# Patient Record
Sex: Female | Born: 1955 | Race: White | Hispanic: No | Smoking: Current every day smoker
Health system: Southern US, Community
[De-identification: ages and names within clinical notes are randomized; demographics above are authoritative.]

## PROBLEM LIST (undated history)

## (undated) DIAGNOSIS — I6523 Occlusion and stenosis of bilateral carotid arteries: Secondary | ICD-10-CM

## (undated) DIAGNOSIS — F32A Depression, unspecified: Secondary | ICD-10-CM

## (undated) DIAGNOSIS — D696 Thrombocytopenia, unspecified: Secondary | ICD-10-CM

## (undated) DIAGNOSIS — K5792 Diverticulitis of intestine, part unspecified, without perforation or abscess without bleeding: Secondary | ICD-10-CM

## (undated) DIAGNOSIS — Z7901 Long term (current) use of anticoagulants: Secondary | ICD-10-CM

## (undated) DIAGNOSIS — I480 Paroxysmal atrial fibrillation: Secondary | ICD-10-CM

## (undated) DIAGNOSIS — M5412 Radiculopathy, cervical region: Secondary | ICD-10-CM

## (undated) DIAGNOSIS — R42 Dizziness and giddiness: Secondary | ICD-10-CM

## (undated) DIAGNOSIS — K219 Gastro-esophageal reflux disease without esophagitis: Secondary | ICD-10-CM

## (undated) DIAGNOSIS — I10 Essential (primary) hypertension: Secondary | ICD-10-CM

## (undated) DIAGNOSIS — G4730 Sleep apnea, unspecified: Secondary | ICD-10-CM

## (undated) DIAGNOSIS — G5601 Carpal tunnel syndrome, right upper limb: Secondary | ICD-10-CM

## (undated) DIAGNOSIS — I471 Supraventricular tachycardia, unspecified: Secondary | ICD-10-CM

## (undated) DIAGNOSIS — K760 Fatty (change of) liver, not elsewhere classified: Secondary | ICD-10-CM

## (undated) DIAGNOSIS — R519 Headache, unspecified: Secondary | ICD-10-CM

## (undated) DIAGNOSIS — Z9289 Personal history of other medical treatment: Secondary | ICD-10-CM

## (undated) DIAGNOSIS — I209 Angina pectoris, unspecified: Secondary | ICD-10-CM

## (undated) DIAGNOSIS — F1410 Cocaine abuse, uncomplicated: Secondary | ICD-10-CM

## (undated) DIAGNOSIS — M1990 Unspecified osteoarthritis, unspecified site: Secondary | ICD-10-CM

## (undated) DIAGNOSIS — I2510 Atherosclerotic heart disease of native coronary artery without angina pectoris: Secondary | ICD-10-CM

## (undated) DIAGNOSIS — K5790 Diverticulosis of intestine, part unspecified, without perforation or abscess without bleeding: Secondary | ICD-10-CM

## (undated) DIAGNOSIS — R1900 Intra-abdominal and pelvic swelling, mass and lump, unspecified site: Secondary | ICD-10-CM

## (undated) DIAGNOSIS — K921 Melena: Secondary | ICD-10-CM

## (undated) DIAGNOSIS — I872 Venous insufficiency (chronic) (peripheral): Secondary | ICD-10-CM

## (undated) DIAGNOSIS — N3281 Overactive bladder: Secondary | ICD-10-CM

## (undated) DIAGNOSIS — G4733 Obstructive sleep apnea (adult) (pediatric): Secondary | ICD-10-CM

## (undated) DIAGNOSIS — R1310 Dysphagia, unspecified: Secondary | ICD-10-CM

## (undated) DIAGNOSIS — D649 Anemia, unspecified: Secondary | ICD-10-CM

## (undated) DIAGNOSIS — K529 Noninfective gastroenteritis and colitis, unspecified: Secondary | ICD-10-CM

## (undated) DIAGNOSIS — I499 Cardiac arrhythmia, unspecified: Secondary | ICD-10-CM

## (undated) DIAGNOSIS — Z9989 Dependence on other enabling machines and devices: Secondary | ICD-10-CM

## (undated) DIAGNOSIS — J849 Interstitial pulmonary disease, unspecified: Secondary | ICD-10-CM

## (undated) DIAGNOSIS — K589 Irritable bowel syndrome without diarrhea: Secondary | ICD-10-CM

## (undated) DIAGNOSIS — U071 COVID-19: Secondary | ICD-10-CM

## (undated) DIAGNOSIS — R0600 Dyspnea, unspecified: Secondary | ICD-10-CM

## (undated) DIAGNOSIS — F329 Major depressive disorder, single episode, unspecified: Secondary | ICD-10-CM

## (undated) DIAGNOSIS — F419 Anxiety disorder, unspecified: Secondary | ICD-10-CM

## (undated) DIAGNOSIS — J45909 Unspecified asthma, uncomplicated: Secondary | ICD-10-CM

## (undated) DIAGNOSIS — J449 Chronic obstructive pulmonary disease, unspecified: Secondary | ICD-10-CM

## (undated) DIAGNOSIS — E781 Pure hyperglyceridemia: Secondary | ICD-10-CM

## (undated) DIAGNOSIS — Z720 Tobacco use: Secondary | ICD-10-CM

## (undated) DIAGNOSIS — G4700 Insomnia, unspecified: Secondary | ICD-10-CM

## (undated) DIAGNOSIS — R1030 Lower abdominal pain, unspecified: Secondary | ICD-10-CM

## (undated) DIAGNOSIS — E119 Type 2 diabetes mellitus without complications: Secondary | ICD-10-CM

## (undated) DIAGNOSIS — I700 Atherosclerosis of aorta: Secondary | ICD-10-CM

## (undated) DIAGNOSIS — G2581 Restless legs syndrome: Secondary | ICD-10-CM

## (undated) HISTORY — DX: Depression, unspecified: F32.A

## (undated) HISTORY — DX: Sleep apnea, unspecified: G47.30

## (undated) HISTORY — DX: Pure hyperglyceridemia: E78.1

## (undated) HISTORY — DX: Unspecified osteoarthritis, unspecified site: M19.90

## (undated) HISTORY — DX: Atherosclerosis of aorta: I70.0

## (undated) HISTORY — DX: Chronic obstructive pulmonary disease, unspecified: J44.9

## (undated) HISTORY — DX: Irritable bowel syndrome, unspecified: K58.9

## (undated) HISTORY — DX: Major depressive disorder, single episode, unspecified: F32.9

## (undated) HISTORY — PX: SHOULDER SURGERY: SHX246

## (undated) HISTORY — DX: Essential (primary) hypertension: I10

## (undated) HISTORY — DX: Diverticulitis of intestine, part unspecified, without perforation or abscess without bleeding: K57.92

## (undated) HISTORY — PX: KNEE SURGERY: SHX244

## (undated) HISTORY — DX: Long term (current) use of anticoagulants: Z79.01

## (undated) HISTORY — PX: EYE SURGERY: SHX253

## (undated) HISTORY — DX: Atherosclerotic heart disease of native coronary artery without angina pectoris: I25.10

## (undated) HISTORY — PX: JOINT REPLACEMENT: SHX530

## (undated) HISTORY — DX: Type 2 diabetes mellitus without complications: E11.9

## (undated) HISTORY — DX: Personal history of other medical treatment: Z92.89

## (undated) HISTORY — DX: Gastro-esophageal reflux disease without esophagitis: K21.9

## (undated) HISTORY — DX: Unspecified asthma, uncomplicated: J45.909

## (undated) HISTORY — DX: Cocaine abuse, uncomplicated: F14.10

## (undated) HISTORY — DX: Anemia, unspecified: D64.9

## (undated) HISTORY — PX: REPLACEMENT TOTAL KNEE: SUR1224

## (undated) HISTORY — PX: CATARACT EXTRACTION, BILATERAL: SHX1313

## (undated) HISTORY — PX: ABDOMINAL HYSTERECTOMY: SHX81

## (undated) HISTORY — PX: KNEE ARTHROPLASTY: SHX992

## (undated) HISTORY — PX: KNEE ARTHROSCOPY: SHX127

---

## 1983-11-26 HISTORY — PX: CHOLECYSTECTOMY: SHX55

## 1983-11-26 HISTORY — PX: APPENDECTOMY: SHX54

## 2005-11-25 HISTORY — PX: CARDIAC CATHETERIZATION: SHX172

## 2006-01-05 ENCOUNTER — Emergency Department: Payer: Self-pay | Admitting: Internal Medicine

## 2006-03-18 ENCOUNTER — Ambulatory Visit: Payer: Self-pay | Admitting: Internal Medicine

## 2006-07-24 ENCOUNTER — Inpatient Hospital Stay: Payer: Self-pay | Admitting: Internal Medicine

## 2006-07-24 ENCOUNTER — Other Ambulatory Visit: Payer: Self-pay

## 2006-07-24 HISTORY — PX: CORONARY ANGIOPLASTY WITH STENT PLACEMENT: SHX49

## 2006-09-12 ENCOUNTER — Ambulatory Visit: Payer: Self-pay | Admitting: Internal Medicine

## 2006-12-02 ENCOUNTER — Ambulatory Visit: Payer: Self-pay | Admitting: Internal Medicine

## 2006-12-16 ENCOUNTER — Other Ambulatory Visit: Payer: Self-pay

## 2006-12-23 ENCOUNTER — Inpatient Hospital Stay: Payer: Self-pay | Admitting: Unknown Physician Specialty

## 2007-01-28 ENCOUNTER — Encounter: Payer: Self-pay | Admitting: Unknown Physician Specialty

## 2007-03-25 ENCOUNTER — Ambulatory Visit: Payer: Self-pay | Admitting: Internal Medicine

## 2008-05-25 DIAGNOSIS — E119 Type 2 diabetes mellitus without complications: Secondary | ICD-10-CM

## 2008-05-25 HISTORY — DX: Type 2 diabetes mellitus without complications: E11.9

## 2008-08-09 ENCOUNTER — Ambulatory Visit: Payer: Self-pay | Admitting: Internal Medicine

## 2008-08-31 ENCOUNTER — Ambulatory Visit: Payer: Self-pay | Admitting: Unknown Physician Specialty

## 2008-11-09 ENCOUNTER — Ambulatory Visit: Payer: Self-pay

## 2009-02-13 ENCOUNTER — Ambulatory Visit: Payer: Self-pay | Admitting: Gastroenterology

## 2009-11-30 ENCOUNTER — Ambulatory Visit: Payer: Self-pay | Admitting: Internal Medicine

## 2010-05-03 ENCOUNTER — Ambulatory Visit: Payer: Self-pay | Admitting: Internal Medicine

## 2010-12-20 ENCOUNTER — Ambulatory Visit: Payer: Self-pay | Admitting: Internal Medicine

## 2010-12-26 ENCOUNTER — Ambulatory Visit: Payer: Self-pay | Admitting: Internal Medicine

## 2011-04-25 ENCOUNTER — Ambulatory Visit: Payer: Self-pay | Admitting: Internal Medicine

## 2011-05-06 ENCOUNTER — Ambulatory Visit: Payer: Self-pay | Admitting: Internal Medicine

## 2011-05-26 ENCOUNTER — Ambulatory Visit: Payer: Self-pay | Admitting: Internal Medicine

## 2011-06-05 LAB — LIPID PANEL
Cholesterol: 155 mg/dL (ref 0–200)
HDL: 41 mg/dL (ref 35–70)
LDL Cholesterol: 91 mg/dL
Triglycerides: 115 mg/dL (ref 40–160)

## 2011-06-05 LAB — HEPATIC FUNCTION PANEL: Alkaline Phosphatase: 56 U/L (ref 25–125)

## 2011-06-05 LAB — BASIC METABOLIC PANEL: BUN: 8 mg/dL (ref 4–21)

## 2011-06-26 ENCOUNTER — Ambulatory Visit: Payer: Self-pay | Admitting: Internal Medicine

## 2012-05-21 ENCOUNTER — Ambulatory Visit: Payer: Self-pay | Admitting: Internal Medicine

## 2012-08-28 ENCOUNTER — Other Ambulatory Visit: Payer: Self-pay | Admitting: Internal Medicine

## 2012-08-28 MED ORDER — CYCLOBENZAPRINE HCL 10 MG PO TABS: 10.0000 mg | ORAL_TABLET | Freq: Three times a day (TID) | ORAL | Status: DC | PRN

## 2012-08-28 NOTE — Telephone Encounter (Signed)
Ok to refill flexeril 10mg  tid prn #90 with one refill.  thanks

## 2012-08-28 NOTE — Telephone Encounter (Signed)
Pt is needing refill on Felxeril.

## 2012-08-28 NOTE — Telephone Encounter (Signed)
Kathryn Friedman Pt is complete out of meds   cyclobenzapr 10mg    1 only tablet by mouth 3 times day

## 2012-10-27 ENCOUNTER — Telehealth: Payer: Self-pay | Admitting: Internal Medicine

## 2012-10-27 NOTE — Telephone Encounter (Signed)
Caller: Aisa/Patient; Phone: [phone]; Reason for Call: Patient wants to know if Dr.  Lorin Picket would consider placing patient back on Abilify.  Patient is following Dr.  Lorin Picket from [facility] and has appt scheduled 11/24/12.  Has been on celexa, and came off Abilify as she felt better about a year ago, but would like to restart it.  States is safe.  No triage done due to new patient in this office; info to office for provider review/Rx/callback.  Uses [facility] on [address]  May reach patient at [phone].

## 2012-10-28 NOTE — Telephone Encounter (Signed)
Left message for patient to return call.

## 2012-10-28 NOTE — Telephone Encounter (Signed)
I do not prescribe abilify - this is prescribed by psychiatry.  She will need to be referred to psychiatry for evaluation and question of need for abilify.  I can make appt if she desires.

## 2012-10-29 NOTE — Telephone Encounter (Signed)
Called patient to let her know. Patient was not interested in referral.

## 2012-10-29 NOTE — Telephone Encounter (Signed)
Patient returned your call.

## 2012-11-04 ENCOUNTER — Telehealth: Payer: Self-pay | Admitting: Internal Medicine

## 2012-11-04 NOTE — Telephone Encounter (Signed)
Pt is calling back concerning medication and to see if Dr. Lorin Picket received her records ???

## 2012-11-05 ENCOUNTER — Telehealth: Payer: Self-pay | Admitting: *Deleted

## 2012-11-05 ENCOUNTER — Other Ambulatory Visit: Payer: Self-pay | Admitting: *Deleted

## 2012-11-05 MED ORDER — TRAZODONE HCL 100 MG PO TABS: 100.0000 mg | ORAL_TABLET | Freq: Every day | ORAL | Status: DC

## 2012-11-05 NOTE — Telephone Encounter (Signed)
I called pharmacy to change rx.  Pt had already come by to pick up 90 pills.  I cancelled the three refills.  She has an appt to see me soon.

## 2012-11-05 NOTE — Telephone Encounter (Signed)
Patients Trazodone 100mg  was refilled with # 90 and 3 refills. Dr. Lorin Picket cancelled the 3 refills because the patient has not established with her but has an appointment on 11-24-12.

## 2012-11-05 NOTE — Telephone Encounter (Signed)
Filled script for Trazodone 100 mg x's 3 refills

## 2012-11-12 ENCOUNTER — Telehealth: Payer: Self-pay | Admitting: Internal Medicine

## 2012-11-12 NOTE — Telephone Encounter (Signed)
Clopidogrel 75 mg tablet  Take one tablet by mouth every day  #90  Ventolin HFA 71mcg/inh  In 54 GM  Inhale 2 puffs four times a dauy

## 2012-11-16 MED ORDER — ALBUTEROL SULFATE HFA 108 (90 BASE) MCG/ACT IN AERS: 2.0000 | INHALATION_SPRAY | Freq: Four times a day (QID) | RESPIRATORY_TRACT | Status: DC | PRN

## 2012-11-16 MED ORDER — CLOPIDOGREL BISULFATE 75 MG PO TABS: 75.0000 mg | ORAL_TABLET | Freq: Every day | ORAL | Status: DC

## 2012-11-16 NOTE — Telephone Encounter (Signed)
Sent in to pharmacy.  

## 2012-11-24 ENCOUNTER — Ambulatory Visit: Payer: Self-pay | Admitting: Internal Medicine

## 2012-11-24 ENCOUNTER — Telehealth: Payer: Self-pay | Admitting: Internal Medicine

## 2012-11-24 ENCOUNTER — Other Ambulatory Visit: Payer: Self-pay | Admitting: *Deleted

## 2012-11-24 MED ORDER — CYCLOBENZAPRINE HCL 10 MG PO TABS: 10.0000 mg | ORAL_TABLET | Freq: Three times a day (TID) | ORAL | Status: DC | PRN

## 2012-11-24 NOTE — Telephone Encounter (Signed)
Pt is needing refill on Flexeril 30 mg. She uses [facility] on [address]

## 2012-11-24 NOTE — Telephone Encounter (Signed)
complete

## 2012-12-03 ENCOUNTER — Other Ambulatory Visit: Payer: Self-pay | Admitting: Internal Medicine

## 2012-12-03 NOTE — Telephone Encounter (Signed)
clopidogrel (PLAVIX) 75 MG tablet  # 90  Send to [facility]   DO NOT SEND TO  [facility]

## 2012-12-04 MED ORDER — CLOPIDOGREL BISULFATE 75 MG PO TABS: 75.0000 mg | ORAL_TABLET | Freq: Every day | ORAL | Status: DC

## 2012-12-04 NOTE — Telephone Encounter (Signed)
Faxed script

## 2012-12-04 NOTE — Telephone Encounter (Signed)
rx for plavix - printed - please send to [facility].  May have to fax.

## 2012-12-04 NOTE — Telephone Encounter (Signed)
Dr. Lorin Picket,  Patient has one refill to to last her till her appointment on 01-04-13 She is requesting a 3 month supply. Please advise

## 2012-12-04 NOTE — Telephone Encounter (Signed)
rx printed for plavix.  Will need to go to [facility] .  May have to fax

## 2012-12-25 ENCOUNTER — Other Ambulatory Visit: Payer: Self-pay | Admitting: Internal Medicine

## 2012-12-25 NOTE — Telephone Encounter (Signed)
Refills on Citalopram 20 mg, Cyclobenzapr 10 mg, and Metformin 500 mg. She uses [facility] on [address].

## 2012-12-25 NOTE — Telephone Encounter (Deleted)
Dr.Scott can these

## 2012-12-28 MED ORDER — CITALOPRAM HYDROBROMIDE 20 MG PO TABS: 20.0000 mg | ORAL_TABLET | Freq: Every day | ORAL | Status: DC

## 2012-12-28 MED ORDER — METFORMIN HCL 500 MG PO TABS: 500.0000 mg | ORAL_TABLET | Freq: Two times a day (BID) | ORAL | Status: DC

## 2012-12-28 MED ORDER — CYCLOBENZAPRINE HCL 10 MG PO TABS: 10.0000 mg | ORAL_TABLET | Freq: Three times a day (TID) | ORAL | Status: DC | PRN

## 2012-12-28 NOTE — Telephone Encounter (Signed)
Sent in to pharmacy.  

## 2013-01-01 ENCOUNTER — Encounter: Payer: Self-pay | Admitting: *Deleted

## 2013-01-01 ENCOUNTER — Telehealth: Payer: Self-pay | Admitting: Internal Medicine

## 2013-01-01 ENCOUNTER — Encounter: Payer: Self-pay | Admitting: Internal Medicine

## 2013-01-01 NOTE — Telephone Encounter (Signed)
Pt had a New Pt appt to see Dr. Lorin Picket on 2/10 but has no ins. And is unable to pay.  Pt had to reschedule and could not get back in until 4/29.  Pt is asking if Dr. Lorin Picket would be able to help her with her prescriptions until she is able to be seen, or move her appt to a closer date than April.  Pt is a previous pt of Dr. Lorin Picket from [place].  Please advise.

## 2013-01-01 NOTE — Telephone Encounter (Signed)
Patient was transferred back here and she state she is unable to keep her appt on Monday because she will not be able to make her copayment. Wanted to let Dr. Lorin Picket and her nurse know this is the reason and the next first available appt is not until 4/29. If anything else opens up for her to be seen earlier please call her to schedule. Not out of medications now but will be needing refills before her appt in April.

## 2013-01-03 NOTE — Telephone Encounter (Signed)
Can put her on a cancellation list for earlier appt, but if needed i can refill her meds until her April appt.  She will need to keep her April appt.  Pharmacy can let us know when refills are needed.  Thanks.

## 2013-01-04 ENCOUNTER — Ambulatory Visit: Payer: Self-pay | Admitting: Internal Medicine

## 2013-01-04 NOTE — Telephone Encounter (Signed)
Put on cancellation list 

## 2013-01-14 ENCOUNTER — Other Ambulatory Visit: Payer: Self-pay | Admitting: *Deleted

## 2013-01-18 MED ORDER — ALBUTEROL SULFATE HFA 108 (90 BASE) MCG/ACT IN AERS: 2.0000 | INHALATION_SPRAY | Freq: Four times a day (QID) | RESPIRATORY_TRACT | Status: DC | PRN

## 2013-01-18 NOTE — Telephone Encounter (Signed)
Sent in to pharmacy.  

## 2013-01-28 ENCOUNTER — Telehealth: Payer: Self-pay | Admitting: Internal Medicine

## 2013-01-28 MED ORDER — CITALOPRAM HYDROBROMIDE 20 MG PO TABS: 20.0000 mg | ORAL_TABLET | Freq: Every day | ORAL | Status: DC

## 2013-01-28 MED ORDER — TRAZODONE HCL 100 MG PO TABS: 100.0000 mg | ORAL_TABLET | Freq: Every day | ORAL | Status: DC

## 2013-01-28 NOTE — Telephone Encounter (Signed)
After speaking to Dr. Lorin Picket called [facility] pharmacy to advise the pharmacist to give patient enough of the medications to last her until her appointment on 02/17/13. Was advised by Valda Lamb at the pharmacy that they have never filled anything for patient there and would need to get information from patient to put her in the system and make this notation. Tried to contact patient at telephone numbers in EMR and all have been disconnected or wrong number. Will forward to Dr. Lorin Picket for her to review.

## 2013-01-28 NOTE — Telephone Encounter (Signed)
citalopram (CELEXA) 20 MG tablet    # 30  traZODone (DESYREL) 100 MG tablet   #30

## 2013-01-28 NOTE — Telephone Encounter (Signed)
Scripts for celexa and trazadone filled until appoint on 4/29

## 2013-01-28 NOTE — Telephone Encounter (Signed)
Since unable to get in touch with patient and clarify pharmacy - will hold on sending rx to another pharmacy

## 2013-02-01 ENCOUNTER — Other Ambulatory Visit: Payer: Self-pay | Admitting: *Deleted

## 2013-02-01 MED ORDER — CITALOPRAM HYDROBROMIDE 20 MG PO TABS: 20.0000 mg | ORAL_TABLET | Freq: Every day | ORAL | Status: DC

## 2013-02-01 MED ORDER — TRAZODONE HCL 100 MG PO TABS: 100.0000 mg | ORAL_TABLET | Freq: Every day | ORAL | Status: DC

## 2013-02-01 NOTE — Telephone Encounter (Signed)
Patient called stating that she called last week and also had her pharmacy send over a request for a refill on her Celexa and Trazodone and they never heard anything back. Advised patient that we tried to call her to confirm her pharmacy and the telephone numbers in her chart were incorrect. Updated telephone numbers in EMR. Patient states that she is completely out of her medication. Advised patient that refills will be sent in today, but she will need to keep her appointment that is scheduled this month per Dr. Lorin Picket. Please call patient when refills have been done. Pharmacy- [facility].

## 2013-02-09 ENCOUNTER — Encounter: Payer: Self-pay | Admitting: *Deleted

## 2013-02-10 ENCOUNTER — Other Ambulatory Visit: Payer: Self-pay | Admitting: *Deleted

## 2013-02-10 MED ORDER — ALBUTEROL SULFATE HFA 108 (90 BASE) MCG/ACT IN AERS: 2.0000 | INHALATION_SPRAY | Freq: Four times a day (QID) | RESPIRATORY_TRACT | Status: DC | PRN

## 2013-02-10 NOTE — Telephone Encounter (Signed)
Sent in to pharmacy.  

## 2013-02-10 NOTE — Telephone Encounter (Signed)
The Ventolin HFA 90 mcg/INH IN, is suppose to go to:  [facility]  [address] [place], [place] [address]  Phone number: [phone]  Fax Number: [phone]

## 2013-02-17 ENCOUNTER — Ambulatory Visit: Payer: Self-pay | Admitting: Internal Medicine

## 2013-02-17 ENCOUNTER — Telehealth: Payer: Self-pay | Admitting: Internal Medicine

## 2013-02-17 ENCOUNTER — Encounter: Payer: Self-pay | Admitting: Internal Medicine

## 2013-02-17 ENCOUNTER — Ambulatory Visit (INDEPENDENT_AMBULATORY_CARE_PROVIDER_SITE_OTHER): Payer: No Typology Code available for payment source | Admitting: Internal Medicine

## 2013-02-17 VITALS — BP 130/70 | HR 85 | Temp 97.6°F | Ht 63.16 in | Wt 253.0 lb

## 2013-02-17 DIAGNOSIS — I2510 Atherosclerotic heart disease of native coronary artery without angina pectoris: Secondary | ICD-10-CM

## 2013-02-17 DIAGNOSIS — I10 Essential (primary) hypertension: Secondary | ICD-10-CM | POA: Insufficient documentation

## 2013-02-17 DIAGNOSIS — F329 Major depressive disorder, single episode, unspecified: Secondary | ICD-10-CM

## 2013-02-17 DIAGNOSIS — G4733 Obstructive sleep apnea (adult) (pediatric): Secondary | ICD-10-CM

## 2013-02-17 DIAGNOSIS — R0602 Shortness of breath: Secondary | ICD-10-CM

## 2013-02-17 DIAGNOSIS — E119 Type 2 diabetes mellitus without complications: Secondary | ICD-10-CM | POA: Insufficient documentation

## 2013-02-17 DIAGNOSIS — K5790 Diverticulosis of intestine, part unspecified, without perforation or abscess without bleeding: Secondary | ICD-10-CM

## 2013-02-17 DIAGNOSIS — K5730 Diverticulosis of large intestine without perforation or abscess without bleeding: Secondary | ICD-10-CM

## 2013-02-17 DIAGNOSIS — J449 Chronic obstructive pulmonary disease, unspecified: Secondary | ICD-10-CM

## 2013-02-17 DIAGNOSIS — E7800 Pure hypercholesterolemia, unspecified: Secondary | ICD-10-CM

## 2013-02-17 LAB — CBC WITH DIFFERENTIAL/PLATELET
Basophils Absolute: 0 10*3/uL (ref 0.0–0.1)
Eosinophils Absolute: 0.3 10*3/uL (ref 0.0–0.7)
Lymphocytes Relative: 21.2 % (ref 12.0–46.0)
MCHC: 31.8 g/dL (ref 30.0–36.0)
Neutro Abs: 7.4 10*3/uL (ref 1.4–7.7)
Neutrophils Relative %: 72.6 % (ref 43.0–77.0)
RDW: 20.5 % — ABNORMAL HIGH (ref 11.5–14.6)

## 2013-02-17 LAB — BASIC METABOLIC PANEL
BUN: 13 mg/dL (ref 6–23)
CO2: 26 mEq/L (ref 19–32)
Calcium: 9.2 mg/dL (ref 8.4–10.5)
Creatinine, Ser: 0.7 mg/dL (ref 0.4–1.2)
Glucose, Bld: 96 mg/dL (ref 70–99)

## 2013-02-17 LAB — HEPATIC FUNCTION PANEL
AST: 21 U/L (ref 0–37)
Albumin: 3.6 g/dL (ref 3.5–5.2)
Alkaline Phosphatase: 74 U/L (ref 39–117)

## 2013-02-17 LAB — LIPID PANEL
Cholesterol: 128 mg/dL (ref 0–200)
Total CHOL/HDL Ratio: 3
Triglycerides: 93 mg/dL (ref 0.0–149.0)

## 2013-02-17 MED ORDER — METFORMIN HCL 500 MG PO TABS: 500.0000 mg | ORAL_TABLET | Freq: Two times a day (BID) | ORAL | Status: DC

## 2013-02-17 MED ORDER — TRAZODONE HCL 100 MG PO TABS: ORAL_TABLET | ORAL | Status: DC

## 2013-02-17 MED ORDER — CITALOPRAM HYDROBROMIDE 20 MG PO TABS: 20.0000 mg | ORAL_TABLET | Freq: Every day | ORAL | Status: DC

## 2013-02-17 MED ORDER — CYCLOBENZAPRINE HCL 10 MG PO TABS: 10.0000 mg | ORAL_TABLET | Freq: Three times a day (TID) | ORAL | Status: DC | PRN

## 2013-02-17 NOTE — Telephone Encounter (Signed)
Pt asks if you could fax over the form she was talking to you about today at her appt to her work. She says there is a fax number on the form.

## 2013-02-18 ENCOUNTER — Telehealth: Payer: Self-pay | Admitting: Internal Medicine

## 2013-02-18 ENCOUNTER — Encounter: Payer: Self-pay | Admitting: Internal Medicine

## 2013-02-18 DIAGNOSIS — F32A Depression, unspecified: Secondary | ICD-10-CM | POA: Insufficient documentation

## 2013-02-18 DIAGNOSIS — F329 Major depressive disorder, single episode, unspecified: Secondary | ICD-10-CM | POA: Insufficient documentation

## 2013-02-18 DIAGNOSIS — I2510 Atherosclerotic heart disease of native coronary artery without angina pectoris: Secondary | ICD-10-CM | POA: Insufficient documentation

## 2013-02-18 DIAGNOSIS — G4733 Obstructive sleep apnea (adult) (pediatric): Secondary | ICD-10-CM | POA: Insufficient documentation

## 2013-02-18 DIAGNOSIS — K5790 Diverticulosis of intestine, part unspecified, without perforation or abscess without bleeding: Secondary | ICD-10-CM | POA: Insufficient documentation

## 2013-02-18 DIAGNOSIS — J449 Chronic obstructive pulmonary disease, unspecified: Secondary | ICD-10-CM | POA: Insufficient documentation

## 2013-02-18 NOTE — Assessment & Plan Note (Signed)
S/P stent placement.  Has been followed by cardiology.  She has not seen them in a while.  With the sob with exertion, EKG obtained and revealed SR with no acute ischemic changes.  She feels from a cardiac standpoint things are stable.  Continue risk factor modification.

## 2013-02-18 NOTE — Telephone Encounter (Signed)
Placed if folder to be faxed.  Please scan into chart after faxed.

## 2013-02-18 NOTE — Telephone Encounter (Signed)
Placed in Donna's folder to be faxed.  Will need to be scanned into the chart after faxed.

## 2013-02-18 NOTE — Assessment & Plan Note (Signed)
Blood pressure is under good control.  Follow.    

## 2013-02-18 NOTE — Assessment & Plan Note (Signed)
See note for details.  No suicidal ideations.  On citalopram.  Has trazodone - she takes at night.  She wanted to restart abilify.  Was started by psychiatry.  Discussed with her regarding my desire for psych referral.  Have them evaluate her and see if abilify is the correct medication for her.  May need to adjust meds.  Pt agreeable with this plan.   (pt called back with phone number ([place] - [phone])

## 2013-02-18 NOTE — Assessment & Plan Note (Signed)
Continue CPAP.  

## 2013-02-18 NOTE — Progress Notes (Signed)
Subjective:    Patient ID: Kathryn Friedman, female    DOB: 02-29-1956, 57 y.o.   MRN: [id number]  HPI 57 year old female with past history of hypertension, hypercholesterolemia, CAD s/p stent placement and diabetes.  She comes in today as a work in to have papers complete for her work.  She states she works from home and they just need an update on her medical condition.  States she gets sob with exertion.  Still smoking.  Has not seen Dr Meredeth Ide lately.  Using her advair regularly.  Uses the ventolin tid.  No desire to quit smoking at this time.  We discussed the need to quit.  She feels from a cardiac standpoint - things are stable.  No chest pain or tightness.  No palpitations.  Has been under increased stress.  Broke up with her boyfriend.  Also, recent anniversary of the death of her husband.  She is still on Citalopram.  Uses trazodone to help her sleep.  Wants to restart Abilify.  She states she felt this worked well for her.  Was started by psychiatry.  No longer seeing.  No suicidal ideations now. States that several months ago, she did experience some suicidal thoughts, but assures me today that she would not hurt herself.  No nausea or vomiting.  Bowels stable.     Past Medical History  Diagnosis Date  . Hypertension   . Depression     secondary to the death of her husband (died 81)  . COPD (chronic obstructive pulmonary disease)   . Diabetes mellitus without complication Diagnosed 05/2008  . Sleep apnea     on CPAP  . Spastic colon   . Coronary artery disease     s/p stent placement 06/25/06  . Hypertriglyceridemia   . Asthma     Current Outpatient Prescriptions on File Prior to Visit  Medication Sig Dispense Refill  . albuterol (VENTOLIN HFA) 108 (90 BASE) MCG/ACT inhaler Inhale 2 puffs into the lungs every 6 (six) hours as needed.  6.7 g  0  . aspirin 81 MG tablet Take 81 mg by mouth daily. Take 1 tablet by mouth once a day as directed      . calcium carbonate 200 MG  capsule Take 600 mg by mouth 2 (two) times daily with a meal. Take one capsule by mouth twice a day      . clopidogrel (PLAVIX) 75 MG tablet Take 1 tablet (75 mg total) by mouth daily.  90 tablet  0  . Fluticasone-Salmeterol (ADVAIR) 250-50 MCG/DOSE AEPB Inhale 1 puff into the lungs every 12 (twelve) hours. Inhale 1 puff twice a day      . pantoprazole (PROTONIX) 40 MG tablet Take 40 mg by mouth daily. Take 1 tablet by mouth once a day      . albuterol-ipratropium (COMBIVENT) 18-103 MCG/ACT inhaler Inhale 2 puffs into the lungs every 6 (six) hours as needed. Inhale 2 puffs using inhaler three times a day       No current facility-administered medications on file prior to visit.    Review of Systems Patient denies any headache, lightheadedness or dizziness.  No sinus or allergy symptoms.  No chest pain, tightness or palpatations.  No increased cough or congestion.  Does report persistent sob with exertion.  Feels gradually worsening.  Using her inhalers regularly as outlined.  No nausea or vomiting.  No acid reflux.  No abdominal pain or cramping.  No bowel change, such as diarrhea, constipation, BRBPR  or melana.  No urine change.  Increased stress as outlined.        Objective:   Physical Exam Filed Vitals:   02/17/13 0943  BP: 130/70  Pulse: 85  Temp: 97.6 F (53.6 C)   57 year old female in no acute distress.   HEENT:  Nares- clear.  Oropharynx - without lesions. NECK:  Supple.  Nontender.  No audible bruit.  HEART:  Appears to be regular. LUNGS:  No crackles or wheezing audible.  Respirations even and unlabored.  RADIAL PULSE:  Equal bilaterally.  ABDOMEN:  Soft, nontender.  Bowel sounds present and normal.  No audible abdominal bruit.   EXTREMITIES:  No increased edema present.  DP pulses palpable and equal bilaterally.           Assessment & Plan:  MSK.  Evaluated by ortho.  They recommended her staying on Flexeril for her back.  Refilled.  Stable.   HEALTH MAINTENANCE.   Overdue a physical.  Already has scheduled for next month.  Colonoscopy 02/13/09 - diverticulosis.  Mammogram 06/20/12 - BiRADS II.

## 2013-02-18 NOTE — Assessment & Plan Note (Signed)
Continues on Crestor.  Low cholesterol diet.  Check lipid panel and liver function.

## 2013-02-18 NOTE — Assessment & Plan Note (Signed)
Documented on colonoscopy 2010.  Follow.

## 2013-02-18 NOTE — Telephone Encounter (Signed)
Axed form to patient's work.

## 2013-02-18 NOTE — Assessment & Plan Note (Signed)
Has previously been seeing Dr Meredeth Ide.  Remains on Advair and Ventolin.  Symptoms as outlined.  Sob with exertion.  Refer back to Dr Meredeth Ide.

## 2013-02-18 NOTE — Telephone Encounter (Signed)
Can you add a ferritin and ibc panel (dx 285.9) to blood drawn 02/17/13.  Thanks.  Let me know if a problem.

## 2013-02-18 NOTE — Assessment & Plan Note (Signed)
Not checking sugars.  Low carb diet.  Check metabolic panel and a1c.

## 2013-02-20 ENCOUNTER — Other Ambulatory Visit: Payer: Self-pay | Admitting: Internal Medicine

## 2013-02-20 DIAGNOSIS — D649 Anemia, unspecified: Secondary | ICD-10-CM

## 2013-02-20 NOTE — Progress Notes (Signed)
Follow up labs scheduled.

## 2013-02-22 ENCOUNTER — Telehealth: Payer: Self-pay | Admitting: Internal Medicine

## 2013-02-22 NOTE — Telephone Encounter (Signed)
Need to notify pt that she has an appt with psych 02/25/13 at 10:00. If she needs to change this appt she needs to call [phone].  She does need to let them know or keep the appt because if not this will be her third time no show and they will not schedule after this.

## 2013-02-22 NOTE — Telephone Encounter (Signed)
Patient notified

## 2013-02-25 ENCOUNTER — Other Ambulatory Visit: Payer: Self-pay | Admitting: *Deleted

## 2013-03-01 MED ORDER — ALBUTEROL SULFATE HFA 108 (90 BASE) MCG/ACT IN AERS: 2.0000 | INHALATION_SPRAY | Freq: Four times a day (QID) | RESPIRATORY_TRACT | Status: DC | PRN

## 2013-03-01 NOTE — Telephone Encounter (Signed)
Rx sent in to pharmacy. 

## 2013-03-10 NOTE — Telephone Encounter (Signed)
Please close encounter

## 2013-03-23 ENCOUNTER — Ambulatory Visit: Payer: Self-pay | Admitting: Internal Medicine

## 2013-03-25 ENCOUNTER — Telehealth: Payer: Self-pay | Admitting: Internal Medicine

## 2013-03-25 NOTE — Telephone Encounter (Signed)
Please send to [facility]    albuterol (VENTOLIN HFA) 108 (90 BASE) MCG/ACT inhaler

## 2013-03-26 ENCOUNTER — Other Ambulatory Visit: Payer: Self-pay | Admitting: *Deleted

## 2013-03-26 MED ORDER — ALBUTEROL SULFATE HFA 108 (90 BASE) MCG/ACT IN AERS: 2.0000 | INHALATION_SPRAY | Freq: Four times a day (QID) | RESPIRATORY_TRACT | Status: DC | PRN

## 2013-04-08 ENCOUNTER — Ambulatory Visit (INDEPENDENT_AMBULATORY_CARE_PROVIDER_SITE_OTHER): Payer: No Typology Code available for payment source | Admitting: Internal Medicine

## 2013-04-08 ENCOUNTER — Encounter: Payer: Self-pay | Admitting: Internal Medicine

## 2013-04-08 VITALS — BP 120/60 | HR 78 | Temp 99.3°F | Ht 64.5 in | Wt 256.0 lb

## 2013-04-08 DIAGNOSIS — E7800 Pure hypercholesterolemia, unspecified: Secondary | ICD-10-CM

## 2013-04-08 DIAGNOSIS — E119 Type 2 diabetes mellitus without complications: Secondary | ICD-10-CM

## 2013-04-08 DIAGNOSIS — D649 Anemia, unspecified: Secondary | ICD-10-CM

## 2013-04-08 DIAGNOSIS — G4733 Obstructive sleep apnea (adult) (pediatric): Secondary | ICD-10-CM

## 2013-04-08 DIAGNOSIS — I10 Essential (primary) hypertension: Secondary | ICD-10-CM

## 2013-04-08 DIAGNOSIS — I2510 Atherosclerotic heart disease of native coronary artery without angina pectoris: Secondary | ICD-10-CM

## 2013-04-08 DIAGNOSIS — Z1211 Encounter for screening for malignant neoplasm of colon: Secondary | ICD-10-CM

## 2013-04-08 DIAGNOSIS — F329 Major depressive disorder, single episode, unspecified: Secondary | ICD-10-CM

## 2013-04-08 DIAGNOSIS — J449 Chronic obstructive pulmonary disease, unspecified: Secondary | ICD-10-CM

## 2013-04-08 LAB — CBC WITH DIFFERENTIAL/PLATELET
Basophils Absolute: 0 K/uL (ref 0.0–0.1)
Basophils Relative: 0.2 % (ref 0.0–3.0)
Eosinophils Absolute: 0.2 K/uL (ref 0.0–0.7)
Eosinophils Relative: 1.8 % (ref 0.0–5.0)
HCT: 38.2 % (ref 36.0–46.0)
Hemoglobin: 12.5 g/dL (ref 12.0–15.0)
Lymphocytes Relative: 20.4 % (ref 12.0–46.0)
Lymphs Abs: 2.4 K/uL (ref 0.7–4.0)
MCHC: 32.6 g/dL (ref 30.0–36.0)
MCV: 73.9 fl — ABNORMAL LOW (ref 78.0–100.0)
Monocytes Absolute: 0.5 K/uL (ref 0.1–1.0)
Monocytes Relative: 3.8 % (ref 3.0–12.0)
Neutro Abs: 8.8 K/uL — ABNORMAL HIGH (ref 1.4–7.7)
Neutrophils Relative %: 73.8 % (ref 43.0–77.0)
Platelets: 232 K/uL (ref 150.0–400.0)
RBC: 5.18 Mil/uL — ABNORMAL HIGH (ref 3.87–5.11)
RDW: 21.9 % — ABNORMAL HIGH (ref 11.5–14.6)
WBC: 12 K/uL — ABNORMAL HIGH (ref 4.5–10.5)

## 2013-04-11 ENCOUNTER — Encounter: Payer: Self-pay | Admitting: Internal Medicine

## 2013-04-11 ENCOUNTER — Telehealth: Payer: Self-pay | Admitting: Internal Medicine

## 2013-04-11 DIAGNOSIS — D72829 Elevated white blood cell count, unspecified: Secondary | ICD-10-CM

## 2013-04-11 NOTE — Telephone Encounter (Signed)
Pt notified of labs via my chart.  Needs a f/u cbc in a few weeks just to confirm her white blood cell count is stable.  Please schedule a non fasting lab appt in 3 weeks and call pt with appt date and time.  Thanks.

## 2013-04-12 ENCOUNTER — Encounter: Payer: Self-pay | Admitting: Internal Medicine

## 2013-04-12 ENCOUNTER — Telehealth: Payer: Self-pay | Admitting: Internal Medicine

## 2013-04-12 DIAGNOSIS — D649 Anemia, unspecified: Secondary | ICD-10-CM | POA: Insufficient documentation

## 2013-04-12 MED ORDER — METFORMIN HCL 500 MG PO TABS: 500.0000 mg | ORAL_TABLET | Freq: Two times a day (BID) | ORAL | Status: DC

## 2013-04-12 NOTE — Progress Notes (Signed)
Subjective:    Patient ID: Kathryn Friedman, female    DOB: July 16, 1956, 57 y.o.   MRN: [id number]  HPI 57 year old female with past history of hypertension, hypercholesterolemia, CAD s/p stent placement and diabetes.  She comes in today to follow up on these issues as well as for a complete physical exam.  Still smoking.  No desire to quit smoking at this time.  We have discussed the need to quit.  She feels from a cardiac standpoint - things are stable.  No chest pain or tightness.  No palpitations.  Has been under increased stress.  Broke up with her boyfriend.  Also, recent anniversary of the death of her husband.  She is still on Citalopram.  Uses trazodone to help her sleep.  Seeing psych now.  Has restarted Abilify.  Has a follow up planned with psych next week.  No suicidal ideations now. She has had some suicidal thoughts previously.   No nausea or vomiting.  Bowels stable.     Past Medical History  Diagnosis Date  . Hypertension   . Depression     secondary to the death of her husband (died 31)  . COPD (chronic obstructive pulmonary disease)   . Diabetes mellitus without complication Diagnosed 05/2008  . Sleep apnea     on CPAP  . Spastic colon   . Coronary artery disease     s/p stent placement 06/25/06  . Hypertriglyceridemia   . Asthma     Current Outpatient Prescriptions on File Prior to Visit  Medication Sig Dispense Refill  . acetaminophen (TYLENOL) 500 MG tablet Take 500 mg by mouth every 6 (six) hours as needed for pain.      Marland Kitchen albuterol (VENTOLIN HFA) 108 (90 BASE) MCG/ACT inhaler Inhale 2 puffs into the lungs every 6 (six) hours as needed.  6.7 g  4  . amLODipine (NORVASC) 5 MG tablet Take 5 mg by mouth daily.      Marland Kitchen aspirin 81 MG tablet Take 81 mg by mouth daily. Take 1 tablet by mouth once a day as directed      . citalopram (CELEXA) 20 MG tablet Take 1 tablet (20 mg total) by mouth daily.  30 tablet  2  . clopidogrel (PLAVIX) 75 MG tablet Take 1 tablet (75 mg  total) by mouth daily.  90 tablet  0  . cyclobenzaprine (FLEXERIL) 10 MG tablet Take 1 tablet (10 mg total) by mouth 3 (three) times daily as needed.  90 tablet  1  . Diclofenac-Misoprostol (ARTHROTEC) 75-0.2 MG TBEC Take 75 mg by mouth 2 (two) times daily.      . Fluticasone-Salmeterol (ADVAIR) 250-50 MCG/DOSE AEPB Inhale 1 puff into the lungs every 12 (twelve) hours. Inhale 1 puff twice a day      . metFORMIN (GLUCOPHAGE) 500 MG tablet Take 1 tablet (500 mg total) by mouth 2 (two) times daily with a meal.  60 tablet  2  . pantoprazole (PROTONIX) 40 MG tablet Take 40 mg by mouth daily. Take 1 tablet by mouth once a day      . propranolol (INDERAL) 20 MG tablet Take 40 mg by mouth 2 (two) times daily.       . rosuvastatin (CRESTOR) 40 MG tablet Take 40 mg by mouth daily.      . traZODone (DESYREL) 100 MG tablet Take 1 1/2 tablet by mouth at night as needed  45 tablet  1  . albuterol-ipratropium (COMBIVENT) 18-103 MCG/ACT inhaler Inhale 2  puffs into the lungs every 6 (six) hours as needed. Inhale 2 puffs using inhaler three times a day       No current facility-administered medications on file prior to visit.    Review of Systems Patient denies any headache, lightheadedness or dizziness.  No sinus or allergy symptoms.  No chest pain, tightness or palpatations.  No increased cough or congestion.   Using her inhalers regularly as outlined.  No nausea or vomiting.  No acid reflux.  No abdominal pain or cramping.  No bowel change, such as diarrhea, constipation, BRBPR or melana.  No urine change.  Increased stress as outlined.        Objective:   Physical Exam  Filed Vitals:   04/08/13 1100  BP: 120/60  Pulse: 78  Temp: 99.3 F (33.79 C)   57 year old female in no acute distress.   HEENT:  Nares- clear.  Oropharynx - without lesions. NECK:  Supple.  Nontender.  No audible bruit.  HEART:  Appears to be regular. LUNGS:  No crackles or wheezing audible.  Respirations even and unlabored.   RADIAL PULSE:  Equal bilaterally.    BREASTS:  No nipple discharge or nipple retraction present.  Could not appreciate any distinct nodules or axillary adenopathy.  ABDOMEN:  Soft, nontender.  Bowel sounds present and normal.  No audible abdominal bruit.  GU:  Normal external genitalia.  Vaginal vault without lesions.  S/P hysterectomy.  Could not appreciate any adnexal masses or tenderness.   RECTAL:  Heme negative.   EXTREMITIES:  No increased edema present.  DP pulses palpable and equal bilaterally.          Assessment & Plan:  MSK.  Evaluated by ortho.  They recommended her staying on Flexeril for her back.  She does have some increased joint pain involving her shoulders, hands and knees.  Discussed the need for stretches and exercise.  Follow.     HEALTH MAINTENANCE.  Physical today.  Colonoscopy 02/13/09 - diverticulosis.  Mammogram 06/20/12 - BiRADS II.   Schedule a follow up mammogram when due.

## 2013-04-12 NOTE — Telephone Encounter (Signed)
Pt sent myChart message requesting refills of Flexeril and Trazodone. Ok to refill?

## 2013-04-12 NOTE — Assessment & Plan Note (Signed)
S/P stent placement.  Has been followed by cardiology.  She feels from a cardiac standpoint things are stable.  Continue risk factor modification.   

## 2013-04-12 NOTE — Telephone Encounter (Signed)
See my chart  Appointment Request From: Kathryn Friedman With Provider: Charm Barges, MD [-Primary Care Physician-]  Preferred Date Range: From 08/18/2013 To 08/18/2013 Preferred Times: Monday Morning  Reason: To address the following health maintenance concerns. Tetanus/Tdap Comments:  I have got to come in and see Dr. Lorin Picket at 11:00 so can I do this at the same time? I am having labs as well.

## 2013-04-12 NOTE — Assessment & Plan Note (Signed)
Continue CPAP.  

## 2013-04-12 NOTE — Assessment & Plan Note (Signed)
See note for details.  On citalopram.  Has trazodone - she takes at night.  Seeing psychiatry.  Back on Abilify now.  Plans to f/u with them next week.  Discussed the importance of talking with them regarding her previous suicidal thoughts.  No suicidal thoughts currently.

## 2013-04-12 NOTE — Assessment & Plan Note (Signed)
Recheck cbc and iron studies.  IFOB given.  Declines GI evaluation at this time.

## 2013-04-12 NOTE — Assessment & Plan Note (Signed)
Not checking sugars.  Low carb diet.  Follow metabolic panel and a1c.   

## 2013-04-12 NOTE — Assessment & Plan Note (Signed)
Continues on Crestor.  Low cholesterol diet.  Follow lipid panel and liver function.  Lipid panel just checked 02/17/13 - total cholesterol 128, triglycerides 93, HDL 45 and LDL 65.

## 2013-04-12 NOTE — Assessment & Plan Note (Signed)
Has previously been seeing Dr Meredeth Ide.  Remains on Advair and Ventolin.

## 2013-04-12 NOTE — Assessment & Plan Note (Signed)
Blood pressure is under good control.  Follow.    

## 2013-04-13 ENCOUNTER — Telehealth: Payer: Self-pay | Admitting: Internal Medicine

## 2013-04-13 ENCOUNTER — Other Ambulatory Visit: Payer: Self-pay | Admitting: *Deleted

## 2013-04-13 ENCOUNTER — Encounter: Payer: Self-pay | Admitting: Internal Medicine

## 2013-04-13 MED ORDER — CYCLOBENZAPRINE HCL 10 MG PO TABS: 10.0000 mg | ORAL_TABLET | Freq: Three times a day (TID) | ORAL | Status: DC | PRN

## 2013-04-13 MED ORDER — PROPRANOLOL HCL 20 MG PO TABS: 40.0000 mg | ORAL_TABLET | Freq: Two times a day (BID) | ORAL | Status: DC

## 2013-04-13 MED ORDER — TRAZODONE HCL 100 MG PO TABS: ORAL_TABLET | ORAL | Status: DC

## 2013-04-13 MED ORDER — CLOPIDOGREL BISULFATE 75 MG PO TABS: 75.0000 mg | ORAL_TABLET | Freq: Every day | ORAL | Status: DC

## 2013-04-13 NOTE — Telephone Encounter (Signed)
Rx for Plavix & Propranolol were both sent to [facility] for 90 day supply with 1 RF-pt aware

## 2013-04-13 NOTE — Telephone Encounter (Signed)
I had sent you a message this am on this pt regarding her rx refills.  I sent the trazodone and flex[facility]il to [facility].  If you have not canceled - then leave rx as is.  If have canceled - can resend.  Thanks.

## 2013-04-13 NOTE — Telephone Encounter (Signed)
I sent the prescriptions for citalopram and trazodone to [facility] [address] before I saw the second My Chart message stating she wanted them sent to [facility].  I sent her another message to clarify her refills.  Will need to clarify with pt.  Will need to cancel her prescriptions at [facility] if she wants me to send these to [facility].   Thanks.

## 2013-04-14 ENCOUNTER — Other Ambulatory Visit: Payer: Self-pay | Admitting: *Deleted

## 2013-04-14 MED ORDER — PANTOPRAZOLE SODIUM 40 MG PO TBEC: 40.0000 mg | DELAYED_RELEASE_TABLET | Freq: Every day | ORAL | Status: DC

## 2013-04-14 NOTE — Telephone Encounter (Signed)
Lab appointment 6/12 @ 10 pt aware

## 2013-04-15 ENCOUNTER — Encounter: Payer: Self-pay | Admitting: Internal Medicine

## 2013-04-15 DIAGNOSIS — D649 Anemia, unspecified: Secondary | ICD-10-CM

## 2013-04-20 NOTE — Telephone Encounter (Signed)
Order placed for GI referral.  See her my chart message.  I recommended the GI referral secondary to her iron deficiency - for evaluation.  If she is having increased pain, can reevaluate her if needed.

## 2013-04-27 ENCOUNTER — Encounter: Payer: Self-pay | Admitting: Internal Medicine

## 2013-05-06 ENCOUNTER — Other Ambulatory Visit (INDEPENDENT_AMBULATORY_CARE_PROVIDER_SITE_OTHER): Payer: No Typology Code available for payment source

## 2013-05-06 DIAGNOSIS — D72829 Elevated white blood cell count, unspecified: Secondary | ICD-10-CM

## 2013-05-06 LAB — CBC WITH DIFFERENTIAL/PLATELET
Basophils Relative: 0.5 % (ref 0.0–3.0)
Eosinophils Absolute: 0.2 10*3/uL (ref 0.0–0.7)
Eosinophils Relative: 2 % (ref 0.0–5.0)
HCT: 38.5 % (ref 36.0–46.0)
Hemoglobin: 12.2 g/dL (ref 12.0–15.0)
Lymphs Abs: 1.9 10*3/uL (ref 0.7–4.0)
MCHC: 31.6 g/dL (ref 30.0–36.0)
MCV: 76.8 fl — ABNORMAL LOW (ref 78.0–100.0)
Monocytes Absolute: 0.4 10*3/uL (ref 0.1–1.0)
Neutro Abs: 6.4 10*3/uL (ref 1.4–7.7)
RBC: 5.01 Mil/uL (ref 3.87–5.11)
WBC: 8.9 10*3/uL (ref 4.5–10.5)

## 2013-05-09 ENCOUNTER — Encounter: Payer: Self-pay | Admitting: Internal Medicine

## 2013-05-18 ENCOUNTER — Encounter: Payer: Self-pay | Admitting: Internal Medicine

## 2013-05-19 ENCOUNTER — Telehealth: Payer: Self-pay | Admitting: Internal Medicine

## 2013-05-19 ENCOUNTER — Other Ambulatory Visit: Payer: Self-pay | Admitting: *Deleted

## 2013-05-19 MED ORDER — CITALOPRAM HYDROBROMIDE 20 MG PO TABS: 20.0000 mg | ORAL_TABLET | Freq: Every day | ORAL | Status: DC

## 2013-05-19 NOTE — Telephone Encounter (Signed)
error 

## 2013-05-20 ENCOUNTER — Ambulatory Visit: Payer: No Typology Code available for payment source | Admitting: Internal Medicine

## 2013-05-20 ENCOUNTER — Other Ambulatory Visit (INDEPENDENT_AMBULATORY_CARE_PROVIDER_SITE_OTHER): Payer: No Typology Code available for payment source

## 2013-05-20 DIAGNOSIS — Z1211 Encounter for screening for malignant neoplasm of colon: Secondary | ICD-10-CM

## 2013-05-24 ENCOUNTER — Encounter: Payer: Self-pay | Admitting: Internal Medicine

## 2013-06-23 ENCOUNTER — Telehealth: Payer: Self-pay | Admitting: *Deleted

## 2013-06-23 NOTE — Telephone Encounter (Signed)
Pt requested a return call from Dr. Lorin Picket regarding some medication mix ups

## 2013-06-24 NOTE — Telephone Encounter (Signed)
Pt states that every since she has started coming to [place] she has had nothing but problems with getting her meds refilled through [facility]. She stated we either do not send them in or respond to their request, or we deny the refill. She stated that we recently denied a RX because she needed an appointment first. Pt states that she doesn't understand why she would need an appointment if she was seen not too long ago. I informed her that I would forward this to you.

## 2013-06-24 NOTE — Telephone Encounter (Signed)
Need information regarding her medication.

## 2013-06-24 NOTE — Telephone Encounter (Signed)
At the beginning (before I saw her here), I know there was confusion as to where to send her medications.  The phone message stated [facility] instead of [facility],  That has been months ago.  This was clarified with her.   I do not know why [facility] would tell her she needed an appt.  When they send me the forms, I fill them out and send back.  These are not done electronically.

## 2013-06-26 ENCOUNTER — Encounter: Payer: Self-pay | Admitting: Internal Medicine

## 2013-06-28 ENCOUNTER — Other Ambulatory Visit: Payer: Self-pay | Admitting: *Deleted

## 2013-06-28 MED ORDER — DICLOFENAC-MISOPROSTOL 75-0.2 MG PO TBEC: 75.0000 mg | DELAYED_RELEASE_TABLET | Freq: Two times a day (BID) | ORAL | Status: DC

## 2013-06-28 MED ORDER — PANTOPRAZOLE SODIUM 40 MG PO TBEC: 40.0000 mg | DELAYED_RELEASE_TABLET | Freq: Every day | ORAL | Status: DC

## 2013-06-28 MED ORDER — ALBUTEROL SULFATE HFA 108 (90 BASE) MCG/ACT IN AERS: 2.0000 | INHALATION_SPRAY | Freq: Four times a day (QID) | RESPIRATORY_TRACT | Status: DC | PRN

## 2013-06-29 ENCOUNTER — Other Ambulatory Visit: Payer: Self-pay | Admitting: *Deleted

## 2013-06-29 MED ORDER — AMLODIPINE BESYLATE 5 MG PO TABS: 5.0000 mg | ORAL_TABLET | Freq: Every day | ORAL | Status: DC

## 2013-06-30 ENCOUNTER — Encounter: Payer: Self-pay | Admitting: *Deleted

## 2013-06-30 ENCOUNTER — Encounter: Payer: Self-pay | Admitting: Internal Medicine

## 2013-07-12 ENCOUNTER — Ambulatory Visit: Payer: Self-pay

## 2013-07-29 ENCOUNTER — Ambulatory Visit: Payer: Self-pay

## 2013-08-18 ENCOUNTER — Ambulatory Visit: Payer: No Typology Code available for payment source | Admitting: Internal Medicine

## 2013-09-08 ENCOUNTER — Other Ambulatory Visit: Payer: Self-pay | Admitting: Internal Medicine

## 2013-09-09 ENCOUNTER — Telehealth: Payer: Self-pay | Admitting: Internal Medicine

## 2013-09-09 NOTE — Telephone Encounter (Signed)
Refer to other message regarding refill.  I see date on refill.  Need info on urine test.  Thanks.

## 2013-09-09 NOTE — Telephone Encounter (Signed)
LMTCB
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
## 2013-09-09 NOTE — Telephone Encounter (Signed)
Please confirm with pt when last filled and number filled.  Also confirm how often she is taking the flexeril.  Is she one of the pts that would need urine screened before getting her rx.  Let me know.  Thanks.

## 2013-09-10 NOTE — Telephone Encounter (Signed)
Pt states that she found her Flexeril last night

## 2013-09-10 NOTE — Telephone Encounter (Signed)
noted 

## 2013-09-13 NOTE — Telephone Encounter (Signed)
Per last message pt had found her flexeril rx so she should not need.  Thanks.

## 2013-09-23 ENCOUNTER — Ambulatory Visit (INDEPENDENT_AMBULATORY_CARE_PROVIDER_SITE_OTHER): Payer: No Typology Code available for payment source | Admitting: Internal Medicine

## 2013-09-23 ENCOUNTER — Encounter: Payer: Self-pay | Admitting: Internal Medicine

## 2013-09-23 VITALS — BP 130/70 | HR 77 | Temp 98.4°F | Ht 64.5 in | Wt 252.8 lb

## 2013-09-23 DIAGNOSIS — E7800 Pure hypercholesterolemia, unspecified: Secondary | ICD-10-CM

## 2013-09-23 DIAGNOSIS — I2510 Atherosclerotic heart disease of native coronary artery without angina pectoris: Secondary | ICD-10-CM

## 2013-09-23 DIAGNOSIS — I10 Essential (primary) hypertension: Secondary | ICD-10-CM

## 2013-09-23 DIAGNOSIS — E119 Type 2 diabetes mellitus without complications: Secondary | ICD-10-CM

## 2013-09-23 DIAGNOSIS — K5730 Diverticulosis of large intestine without perforation or abscess without bleeding: Secondary | ICD-10-CM

## 2013-09-23 DIAGNOSIS — G4733 Obstructive sleep apnea (adult) (pediatric): Secondary | ICD-10-CM

## 2013-09-23 DIAGNOSIS — Z23 Encounter for immunization: Secondary | ICD-10-CM

## 2013-09-23 DIAGNOSIS — K5790 Diverticulosis of intestine, part unspecified, without perforation or abscess without bleeding: Secondary | ICD-10-CM

## 2013-09-23 DIAGNOSIS — D649 Anemia, unspecified: Secondary | ICD-10-CM

## 2013-09-23 DIAGNOSIS — F3289 Other specified depressive episodes: Secondary | ICD-10-CM

## 2013-09-23 DIAGNOSIS — R197 Diarrhea, unspecified: Secondary | ICD-10-CM

## 2013-09-23 DIAGNOSIS — F329 Major depressive disorder, single episode, unspecified: Secondary | ICD-10-CM

## 2013-09-23 DIAGNOSIS — R195 Other fecal abnormalities: Secondary | ICD-10-CM

## 2013-09-23 DIAGNOSIS — J449 Chronic obstructive pulmonary disease, unspecified: Secondary | ICD-10-CM

## 2013-09-23 DIAGNOSIS — J4489 Other specified chronic obstructive pulmonary disease: Secondary | ICD-10-CM

## 2013-09-23 DIAGNOSIS — F32A Depression, unspecified: Secondary | ICD-10-CM

## 2013-09-26 ENCOUNTER — Encounter: Payer: Self-pay | Admitting: Internal Medicine

## 2013-09-26 DIAGNOSIS — R197 Diarrhea, unspecified: Secondary | ICD-10-CM | POA: Insufficient documentation

## 2013-09-26 NOTE — Assessment & Plan Note (Signed)
Continue CPAP.  

## 2013-09-26 NOTE — Assessment & Plan Note (Signed)
Not checking sugars.  Low carb diet.  Follow metabolic panel and a1c.   

## 2013-09-26 NOTE — Assessment & Plan Note (Signed)
Persistent diarrhea.  Check stool studies.  Discussed with her regarding the bowel change and the previous positive IFOB - the need for referral to GI for further evaluation and testing.  She declines.  Add fiber and a probiotic.

## 2013-09-26 NOTE — Assessment & Plan Note (Signed)
Documented on colonoscopy 2010.  Follow.

## 2013-09-26 NOTE — Assessment & Plan Note (Signed)
Has previously been seeing Dr Fleming.  Remains on Advair and Ventolin.  Breathing stable.   

## 2013-09-26 NOTE — Assessment & Plan Note (Signed)
IFOB given again.  She had one positive.  She requested another IFOB.    Declines GI evaluation at this time.

## 2013-09-26 NOTE — Assessment & Plan Note (Signed)
S/P stent placement.  Has been followed by cardiology.  She feels from a cardiac standpoint things are stable.  Continue risk factor modification.   

## 2013-09-26 NOTE — Assessment & Plan Note (Signed)
Continues on Crestor.  Low cholesterol diet.  Follow lipid panel and liver function.   

## 2013-09-26 NOTE — Progress Notes (Signed)
Subjective:    Patient ID: Kathryn Friedman, female    DOB: 14-Jul-1956, 57 y.o.   MRN: [id number]  HPI 57 year old female with past history of hypertension, hypercholesterolemia, CAD s/p stent placement and diabetes.  She comes in today for a scheduled follow up.   Still smoking.  No desire to quit smoking at this time.  We have discussed the need to quit.  She feels from a cardiac standpoint - things are stable.  No chest pain or tightness.  No palpitations.  Has been under increased stress.  Broke up with her boyfriend.  Seeing psychiatry.  Saw Dr  Elesa Massed yesterday.   Is planning to follow up with the counselor.   She is still on Citalopram.  They also have her on abilify.    Uses trazodone to help her sleep.   No suicidal ideations now. She has had some suicidal thoughts previously.   States she discussed this with psychiatry.   No nausea or vomiting. She does report persistent diarrhea.  Has been present for several months.  Previous hemoccult positive stool.  Has not noticed any blood in her stool.  May go 3-6x/day.  No necessarily associated with eating.  No fever.  No abdominal pain or cramping.      Past Medical History  Diagnosis Date  . Hypertension   . Depression     secondary to the death of her husband (died 53)  . COPD (chronic obstructive pulmonary disease)   . Diabetes mellitus without complication Diagnosed 05/2008  . Sleep apnea     on CPAP  . Spastic colon   . Coronary artery disease     s/p stent placement 06/25/06  . Hypertriglyceridemia   . Asthma     Current Outpatient Prescriptions on File Prior to Visit  Medication Sig Dispense Refill  . acetaminophen (TYLENOL) 500 MG tablet Take 500 mg by mouth every 6 (six) hours as needed for pain.      Marland Kitchen albuterol (VENTOLIN HFA) 108 (90 BASE) MCG/ACT inhaler Inhale 2 puffs into the lungs every 6 (six) hours as needed.  18 g  1  . amLODipine (NORVASC) 5 MG tablet Take 1 tablet (5 mg total) by mouth daily.  90 tablet  1  .  aspirin 81 MG tablet Take 81 mg by mouth daily. Take 1 tablet by mouth once a day as directed      . Calcium Carbonate-Vitamin D (CALCIUM 600 + D PO) Take 600 mg by mouth daily.      . citalopram (CELEXA) 20 MG tablet Take 1 tablet (20 mg total) by mouth daily.  30 tablet  2  . clopidogrel (PLAVIX) 75 MG tablet Take 1 tablet (75 mg total) by mouth daily.  90 tablet  1  . cyclobenzaprine (FLEXERIL) 10 MG tablet Take 1 tablet (10 mg total) by mouth 3 (three) times daily as needed.  90 tablet  1  . Diclofenac-Misoprostol (ARTHROTEC) 75-0.2 MG TBEC Take 75 mg by mouth 2 (two) times daily.  60 tablet  5  . Fluticasone-Salmeterol (ADVAIR) 250-50 MCG/DOSE AEPB Inhale 1 puff into the lungs every 12 (twelve) hours. Inhale 1 puff twice a day      . metFORMIN (GLUCOPHAGE) 500 MG tablet Take 1 tablet (500 mg total) by mouth 2 (two) times daily with a meal.  60 tablet  3  . pantoprazole (PROTONIX) 40 MG tablet Take 1 tablet (40 mg total) by mouth daily. Take 1 tablet by mouth once a day  90 tablet  1  . propranolol (INDERAL) 20 MG tablet Take 2 tablets (40 mg total) by mouth 2 (two) times daily.  360 tablet  1  . rosuvastatin (CRESTOR) 40 MG tablet Take 40 mg by mouth daily.      . traZODone (DESYREL) 100 MG tablet Take 1 1/2 tablet by mouth at night as needed  45 tablet  0   No current facility-administered medications on file prior to visit.    Review of Systems Patient denies any headache, lightheadedness or dizziness.  No sinus or allergy symptoms.  No chest pain, tightness or palpatations.  No increased cough or congestion.   Using her inhalers regularly as outlined.  No nausea or vomiting.  No acid reflux.  No abdominal pain or cramping.  No constipation, BRBPR or melana.  Does report the diarrhea as outlined.  No urine change.  Increased stress as outlined.   Seeing psychiatry.       Objective:   Physical Exam  Filed Vitals:   09/23/13 1130  BP: 130/70  Pulse: 77  Temp: 98.4 F (85.77 C)   57  year old female in no acute distress.   HEENT:  Nares- clear.  Oropharynx - without lesions. NECK:  Supple.  Nontender.  No audible bruit.  HEART:  Appears to be regular. LUNGS:  No crackles or wheezing audible.  Respirations even and unlabored.  RADIAL PULSE:  Equal bilaterally.    ABDOMEN:  Soft, nontender.  Bowel sounds present and normal.  No audible abdominal bruit.   EXTREMITIES:  No increased edema present.  DP pulses palpable and equal bilaterally.          Assessment & Plan:  MSK.  Evaluated by ortho.  They recommended her staying on Flexeril for her back.  Follow.     HEALTH MAINTENANCE.  Physical 04/08/13.    Colonoscopy 02/13/09 - diverticulosis.  Mammogram 06/20/12 - BiRADS II.  Needs mammogram.

## 2013-09-26 NOTE — Assessment & Plan Note (Signed)
See note for details.  On citalopram.  Has trazodone - she takes at night.  Seeing psychiatry.  Back on Abilify now.  Just saw Dr Elesa Massed yesterday.  Planning to see the counselor.

## 2013-09-26 NOTE — Assessment & Plan Note (Signed)
Blood pressure is under good control.  Follow.    

## 2013-09-29 ENCOUNTER — Other Ambulatory Visit (INDEPENDENT_AMBULATORY_CARE_PROVIDER_SITE_OTHER): Payer: No Typology Code available for payment source

## 2013-09-29 DIAGNOSIS — R197 Diarrhea, unspecified: Secondary | ICD-10-CM

## 2013-09-30 LAB — OVA AND PARASITE SCREEN

## 2013-09-30 LAB — FECAL LACTOFERRIN, QUANT: Lactoferrin: NEGATIVE

## 2013-10-01 ENCOUNTER — Other Ambulatory Visit (INDEPENDENT_AMBULATORY_CARE_PROVIDER_SITE_OTHER): Payer: No Typology Code available for payment source

## 2013-10-01 DIAGNOSIS — R195 Other fecal abnormalities: Secondary | ICD-10-CM

## 2013-10-01 LAB — FECAL OCCULT BLOOD, IMMUNOCHEMICAL: Fecal Occult Bld: POSITIVE

## 2013-10-02 ENCOUNTER — Encounter: Payer: Self-pay | Admitting: Internal Medicine

## 2013-10-02 ENCOUNTER — Other Ambulatory Visit: Payer: Self-pay | Admitting: Internal Medicine

## 2013-10-02 DIAGNOSIS — R195 Other fecal abnormalities: Secondary | ICD-10-CM

## 2013-10-03 LAB — STOOL CULTURE

## 2013-10-04 NOTE — Telephone Encounter (Signed)
Refilled flexeril #90 with no refills.  

## 2013-10-04 NOTE — Telephone Encounter (Signed)
Refill? Last visit 09/23/13

## 2013-10-05 ENCOUNTER — Encounter: Payer: Self-pay | Admitting: Internal Medicine

## 2013-10-05 MED ORDER — METFORMIN HCL 500 MG PO TABS: 500.0000 mg | ORAL_TABLET | Freq: Two times a day (BID) | ORAL | Status: DC

## 2013-10-05 NOTE — Telephone Encounter (Signed)
Refilled metformin and placed order for GI referral.

## 2013-10-15 ENCOUNTER — Telehealth: Payer: Self-pay | Admitting: Emergency Medicine

## 2013-10-15 ENCOUNTER — Encounter: Payer: Self-pay | Admitting: Internal Medicine

## 2013-10-15 NOTE — Telephone Encounter (Signed)
LVM for patient to call our office. GI has changed her apt to 10/19/13 @ 9am.

## 2013-10-19 ENCOUNTER — Encounter: Payer: Self-pay | Admitting: Internal Medicine

## 2013-10-19 ENCOUNTER — Encounter: Payer: Self-pay | Admitting: Nurse Practitioner

## 2013-10-19 ENCOUNTER — Ambulatory Visit (INDEPENDENT_AMBULATORY_CARE_PROVIDER_SITE_OTHER): Payer: No Typology Code available for payment source | Admitting: Nurse Practitioner

## 2013-10-19 VITALS — BP 142/84 | HR 88 | Ht 64.0 in | Wt 247.0 lb

## 2013-10-19 DIAGNOSIS — R112 Nausea with vomiting, unspecified: Secondary | ICD-10-CM | POA: Insufficient documentation

## 2013-10-19 DIAGNOSIS — K219 Gastro-esophageal reflux disease without esophagitis: Secondary | ICD-10-CM

## 2013-10-19 DIAGNOSIS — D509 Iron deficiency anemia, unspecified: Secondary | ICD-10-CM

## 2013-10-19 DIAGNOSIS — R1012 Left upper quadrant pain: Secondary | ICD-10-CM

## 2013-10-19 DIAGNOSIS — R197 Diarrhea, unspecified: Secondary | ICD-10-CM

## 2013-10-19 DIAGNOSIS — E119 Type 2 diabetes mellitus without complications: Secondary | ICD-10-CM

## 2013-10-19 MED ORDER — PEG-KCL-NACL-NASULF-NA ASC-C 100 G PO SOLR: 1.0000 | Freq: Once | ORAL | Status: DC

## 2013-10-19 NOTE — Patient Instructions (Signed)
You have been given a separate informational sheet regarding your tobacco use, the importance of quitting and local resources to help you quit.  You have been scheduled for an endoscopy and colonoscopy with propofol. Please follow the written instructions given to you at your visit today. Please pick up your prep at the pharmacy within the next 1-3 days. If you use inhalers (even only as needed), please bring them with you on the day of your procedure.  You will be contacted by our office prior to your procedure for directions on holding your Plavix.  If you do not hear from our office 1 week prior to your scheduled procedure, please call [phone] to discuss.
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
## 2013-10-20 ENCOUNTER — Encounter: Payer: Self-pay | Admitting: Internal Medicine

## 2013-10-20 ENCOUNTER — Encounter: Payer: Self-pay | Admitting: Nurse Practitioner

## 2013-10-20 DIAGNOSIS — K219 Gastro-esophageal reflux disease without esophagitis: Secondary | ICD-10-CM | POA: Insufficient documentation

## 2013-10-20 DIAGNOSIS — D509 Iron deficiency anemia, unspecified: Secondary | ICD-10-CM | POA: Insufficient documentation

## 2013-10-20 NOTE — Progress Notes (Signed)
Agree with initial assessment and plans as outlined 

## 2013-11-02 ENCOUNTER — Ambulatory Visit: Payer: No Typology Code available for payment source | Admitting: Internal Medicine

## 2013-11-04 ENCOUNTER — Encounter: Payer: Self-pay | Admitting: Internal Medicine

## 2013-11-06 MED ORDER — TRAZODONE HCL 100 MG PO TABS: ORAL_TABLET | ORAL | Status: DC

## 2013-11-06 MED ORDER — CYCLOBENZAPRINE HCL 10 MG PO TABS: 10.0000 mg | ORAL_TABLET | Freq: Three times a day (TID) | ORAL | Status: DC | PRN

## 2013-11-06 NOTE — Telephone Encounter (Signed)
Refilled her trazodone and flexeril.  Needs to f/u with psychiatry.  See messaging.  Refer to Dr Laymond Purser.

## 2013-11-19 ENCOUNTER — Encounter: Payer: Self-pay | Admitting: Internal Medicine

## 2013-11-23 ENCOUNTER — Telehealth: Payer: Self-pay

## 2013-11-23 NOTE — Telephone Encounter (Signed)
Notified patient to come off Plavix 5 days prior to his procedure per Dr. Lorin Picket. Pt verbalized understanding.

## 2013-11-23 NOTE — Telephone Encounter (Signed)
Message copied by Jessee Avers on Tue Nov 23, 2013  9:18 AM ------      Message from: Charm Barges      Created: Mon Nov 22, 2013  6:27 PM       Yes, it is ok for her to stop the plavix for 5 days prior to the procedure.  I wanted to confirm you said 5 days - ok.  She will need to restart the plavix as soon as she is able after the procedure - if you will let her know when she can restart.              Thanks      Dr Lorin Picket      ----- Message -----         From: Jessee Avers, CMA         Sent: 11/22/2013   1:32 PM           To: Charm Barges, MD            Have you received an answer from the Cardiologist? Please just let me know as soon as you get an answer. We need to know this week or we may have to reschedule the procedures. Thanks.      ----- Message -----         From: Charm Barges, MD         Sent: 11/10/2013   3:30 PM           To: Jessee Avers, CMA            I know she is no longer seeing her cardiologist, but I have left a message for him.  It has been several years since she had her stent placed.  I will confirm with him, from a cardiac standpoint - no other reason she is taking the medication.  I will get back with you with an update.              Thanks.       Dr Lorin Picket      ----- Message -----         From: Jessee Avers, CMA         Sent: 11/10/2013   1:37 PM           To: Charm Barges, MD            Does that mean you would approve patient to come off her Plavix since the medication has been prescribed by your office and she has not seen her Cardiologist recently? Thanks for you time.                   ------

## 2013-11-29 ENCOUNTER — Encounter: Payer: No Typology Code available for payment source | Admitting: Internal Medicine

## 2013-12-02 ENCOUNTER — Other Ambulatory Visit: Payer: Self-pay

## 2013-12-02 ENCOUNTER — Telehealth: Payer: Self-pay

## 2013-12-02 ENCOUNTER — Encounter: Payer: Self-pay | Admitting: Internal Medicine

## 2013-12-02 ENCOUNTER — Ambulatory Visit (AMBULATORY_SURGERY_CENTER): Payer: BC Managed Care – PPO | Admitting: Internal Medicine

## 2013-12-02 ENCOUNTER — Other Ambulatory Visit (INDEPENDENT_AMBULATORY_CARE_PROVIDER_SITE_OTHER): Payer: BC Managed Care – PPO

## 2013-12-02 VITALS — BP 149/82 | HR 75 | Temp 98.2°F | Resp 16 | Ht 64.0 in | Wt 247.0 lb

## 2013-12-02 DIAGNOSIS — R195 Other fecal abnormalities: Secondary | ICD-10-CM

## 2013-12-02 DIAGNOSIS — D509 Iron deficiency anemia, unspecified: Secondary | ICD-10-CM

## 2013-12-02 DIAGNOSIS — R1012 Left upper quadrant pain: Secondary | ICD-10-CM

## 2013-12-02 DIAGNOSIS — K3189 Other diseases of stomach and duodenum: Secondary | ICD-10-CM

## 2013-12-02 DIAGNOSIS — K254 Chronic or unspecified gastric ulcer with hemorrhage: Secondary | ICD-10-CM

## 2013-12-02 DIAGNOSIS — R109 Unspecified abdominal pain: Secondary | ICD-10-CM

## 2013-12-02 DIAGNOSIS — R197 Diarrhea, unspecified: Secondary | ICD-10-CM

## 2013-12-02 DIAGNOSIS — D126 Benign neoplasm of colon, unspecified: Secondary | ICD-10-CM

## 2013-12-02 LAB — COMPREHENSIVE METABOLIC PANEL
ALBUMIN: 3.7 g/dL (ref 3.5–5.2)
ALT: 34 U/L (ref 0–35)
AST: 33 U/L (ref 0–37)
Alkaline Phosphatase: 60 U/L (ref 39–117)
BUN: 7 mg/dL (ref 6–23)
CHLORIDE: 105 meq/L (ref 96–112)
CO2: 26 meq/L (ref 19–32)
CREATININE: 0.8 mg/dL (ref 0.4–1.2)
Calcium: 8.6 mg/dL (ref 8.4–10.5)
GFR: 81.91 mL/min (ref >60.00)
GLUCOSE: 102 mg/dL — AB (ref 70–99)
POTASSIUM: 3.8 meq/L (ref 3.5–5.1)
Sodium: 139 mEq/L (ref 135–145)
Total Bilirubin: 0.5 mg/dL (ref 0.3–1.2)
Total Protein: 6.9 g/dL (ref 6.0–8.3)

## 2013-12-02 MED ORDER — SODIUM CHLORIDE 0.9 % IV SOLN: 500.0000 mL | INTRAVENOUS | Status: DC

## 2013-12-02 NOTE — Telephone Encounter (Signed)
Pt scheduled for CT of A/P at [place] 12/08/13 at 1:30pm. Pt to be NPO after 9:30am except for bottle 1 of contrast at 11:30am and bottle 2 at 12:30pm. Pt to hold Metformin the day of the CT scan. Margaret Pyle RN to notify pt of appt dates and times. Pt to have labs drawn today.

## 2013-12-02 NOTE — Progress Notes (Signed)
Pt. To resume Plavix today per Dr. Henrene Pastor, she was advised.

## 2013-12-02 NOTE — Op Note (Signed)
[facility]  [address] [place], [id number]   COLONOSCOPY PROCEDURE REPORT  PATIENT: Kathryn Friedman, Kathryn Friedman  MR#: [id number] BIRTHDATE: Apr 10, 1956 , 49  yrs. old GENDER: Female ENDOSCOPIST: Eustace Quail, MD REFERRED [id number] Nicki Reaper, M.D. PROCEDURE DATE:  12/02/2013 PROCEDURE:   Colonoscopy with biopsy First Screening Colonoscopy - Avg.  risk and is 50 yrs.  old or older - No.  Prior Negative Screening - Now for repeat screening. N/A  History of Adenoma - Now for follow-up colonoscopy & has been > or = to 3 yrs.  N/A  Polyps Removed Today? No.  Recommend repeat exam, <10 yrs? No. ASA CLASS:   Class III INDICATIONS:heme-positive stool, Iron Deficiency Anemia, and chronic diarrhea. MEDICATIONS: MAC sedation, administered by CRNA and propofol (Diprivan) 250mg  IV DESCRIPTION OF PROCEDURE:   After the risks benefits and alternatives of the procedure were thoroughly explained, informed consent was obtained.  A digital rectal exam revealed no abnormalities of the rectum.   The [id number]  endoscope was introduced through the anus and advanced to the cecum, which was identified by both the appendix and ileocecal valve. No adverse events experienced.   The quality of the prep was excellent, using MoviPrep  The instrument was then slowly withdrawn as the colon was fully examined.  COLON FINDINGS: The mucosa appeared normal in the terminal ileum. Moderate diverticulosis was noted  in the right colon and left colon.   The colon mucosa was otherwise normal. Random colon bx taken... Retroflexed views revealed internal hemorrhoids. The time to cecum=1 minutes 12 seconds.  Withdrawal time=10 minutes 02 seconds.  The scope was withdrawn and the procedure completed. COMPLICATIONS: There were no complications.  ENDOSCOPIC IMPRESSION: 1.   Normal mucosa in the terminal ileum 2.   Moderate diverticulosis was noted in the right colon and left colon 3.   The colon  mucosa was otherwise normal s/p biopsies  RECOMMENDATIONS: 1.  Await biopsy results 2.  Continue current colorectal screening recommendations for "routine risk" patients with a repeat colonoscopy in 10 years. 3.  Upper endoscopy today (see report)   eSigned:  Eustace Quail, MD 12/02/2013 2:20 PM   cc: Einar Pheasant, MD and The Patient

## 2013-12-02 NOTE — Patient Instructions (Addendum)
YOU HAD AN ENDOSCOPIC PROCEDURE TODAY AT THE [facility]: Refer to the procedure report that was given to you for any specific questions about what was found during the examination.  If the procedure report does not answer your questions, please call your gastroenterologist to clarify.  If you requested that your care partner not be given the details of your procedure findings, then the procedure report has been included in a sealed envelope for you to review at your convenience later.  YOU SHOULD EXPECT: Some feelings of bloating in the abdomen. Passage of more gas than usual.  Walking can help get rid of the air that was put into your GI tract during the procedure and reduce the bloating. If you had a lower endoscopy (such as a colonoscopy or flexible sigmoidoscopy) you may notice spotting of blood in your stool or on the toilet paper. If you underwent a bowel prep for your procedure, then you may not have a normal bowel movement for a few days.  DIET: Your first meal following the procedure should be a light meal and then it is ok to progress to your normal diet.  A half-sandwich or bowl of soup is an example of a good first meal.  Heavy or fried foods are harder to digest and may make you feel nauseous or bloated.  Likewise meals heavy in dairy and vegetables can cause extra gas to form and this can also increase the bloating.  Drink plenty of fluids but you should avoid alcoholic beverages for 24 hours.  ACTIVITY: Your care partner should take you home directly after the procedure.  You should plan to take it easy, moving slowly for the rest of the day.  You can resume normal activity the day after the procedure however you should NOT DRIVE or use heavy machinery for 24 hours (because of the sedation medicines used during the test).    SYMPTOMS TO REPORT IMMEDIATELY: A gastroenterologist can be reached at any hour.  During normal business hours, 8:30 AM to 5:00 PM Monday through Friday,  call [phone].  After hours and on weekends, please call the GI answering service at [phone] who will take a message and have the physician on call contact you.   Following lower endoscopy (colonoscopy or flexible sigmoidoscopy):  Excessive amounts of blood in the stool  Significant tenderness or worsening of abdominal pains  Swelling of the abdomen that is new, acute  Fever of 100F or higher  Following upper endoscopy (EGD)  Vomiting of blood or coffee ground material  New chest pain or pain under the shoulder blades  Painful or persistently difficult swallowing  New shortness of breath  Fever of 100F or higher  Black, tarry-looking stools  FOLLOW UP: If any biopsies were taken you will be contacted by phone or by letter within the next 1-3 weeks.  Call your gastroenterologist if you have not heard about the biopsies in 3 weeks.  Our staff will call the home number listed on your records the next business day following your procedure to check on you and address any questions or concerns that you may have at that time regarding the information given to you following your procedure. This is a courtesy call and so if there is no answer at the home number and we have not heard from you through the emergency physician on call, we will assume that you have returned to your regular daily activities without incident.  SIGNATURES/CONFIDENTIALITY: You and/or your care   partner have signed paperwork which will be entered into your electronic medical record.  These signatures attest to the fact that that the information above on your After Visit Summary has been reviewed and is understood.  Full responsibility of the confidentiality of this discharge information lies with you and/or your care-partner.   Diverticulosis and high fiber diet information given.  Repeat colonoscopy in 10 years-2025.  Await Biopsy results, will treat if you have H-pylori, Dr. Henrene Pastor will let you  know.  Contrast given for CT Sca.  Resume Plavix today-per Dr. Henrene Pastor.  Call office to be seen in 4 weeks with Dr. Henrene Pastor.

## 2013-12-02 NOTE — Progress Notes (Signed)
Called to room to assist during endoscopic procedure.  Patient ID and intended procedure confirmed with present staff. Received instructions for my participation in the procedure from the performing physician.  

## 2013-12-02 NOTE — Progress Notes (Signed)
Lidocaine-40mg IV prior to Propofol InductionPropofol given over incremental dosages 

## 2013-12-02 NOTE — Op Note (Signed)
[place]  [address] [place], [id number]   ENDOSCOPY PROCEDURE REPORT  PATIENT: Kathryn, Friedman  MR#: [id number] BIRTHDATE: 08-Nov-1956 , 25  yrs. old GENDER: Female ENDOSCOPIST: Eustace Quail, MD REFERRED BY:  Einar Pheasant, M.D. PROCEDURE DATE:  12/02/2013 PROCEDURE:  EGD w/ biopsy ASA CLASS:     Class III INDICATIONS:  abdominal pain in upper left quadrant.   Iron deficiency anemia.   Occult blood positive. MEDICATIONS: MAC sedation, administered by CRNA and propofol (Diprivan) 150mg  IV TOPICAL ANESTHETIC: none  DESCRIPTION OF PROCEDURE: After the risks benefits and alternatives of the procedure were thoroughly explained, informed consent was obtained.  The LB [id number] endoscope was introduced through the mouth and advanced to the second portion of the duodenum. Without limitations.  The instrument was slowly withdrawn as the mucosa was fully examined.      EXAM: The esophagus was normal.  The stomach revealed superficial ulceration of the pylorus with stenosis as well as superficial prepyloric ulceration.  CLO biopsy taken.  The duodenal bulb and post bulbar duodenum were normal.  Retroflexed views revealed no abnormalities.     The scope was then withdrawn from the patient and the procedure completed.  COMPLICATIONS: There were no complications. ENDOSCOPIC IMPRESSION: 1. Superficial ulceration of the pylorus and prepyloric region with deformity. May be secondary to NSAIDs. Explained Hemoccult-positive stool and anemia. Likely explains other dyspeptic complaints and possibly pain  RECOMMENDATIONS: 1.  Await biopsy results. Treat for H. pylori if positive CLO test 2.  Continue PPI 3.  Contrast-enhanced CT Scan of the abdomen and pelvis "worsening left-sided abdominal pain" 4.  Call for office visit to be seen in 4 weeks  REPEAT EXAM:  eSigned:  Eustace Quail, MD 12/02/2013 2:26 PM   AL:[id number] Nicki Reaper, MD

## 2013-12-03 ENCOUNTER — Telehealth: Payer: Self-pay

## 2013-12-03 LAB — HELICOBACTER PYLORI SCREEN-BIOPSY: UREASE: NEGATIVE

## 2013-12-03 NOTE — Telephone Encounter (Signed)
  Follow up Call-  Call back number 12/02/2013  Post procedure Call Back phone  # [phone]  Permission to leave phone message Yes     Patient questions:  Do you have a fever, pain , or abdominal swelling? no Pain Score  0 *  Have you tolerated food without any problems? yes  Have you been able to return to your normal activities? yes  Do you have any questions about your discharge instructions: Diet   no Medications  no Follow up visit  no  Do you have questions or concerns about your Care? no  Actions: * If pain score is 4 or above: No action needed, pain <4.

## 2013-12-06 ENCOUNTER — Other Ambulatory Visit: Payer: BC Managed Care – PPO

## 2013-12-07 ENCOUNTER — Encounter: Payer: Self-pay | Admitting: Internal Medicine

## 2013-12-08 ENCOUNTER — Inpatient Hospital Stay: Admission: RE | Admit: 2013-12-08 | Payer: BC Managed Care – PPO | Source: Ambulatory Visit

## 2013-12-16 ENCOUNTER — Ambulatory Visit (INDEPENDENT_AMBULATORY_CARE_PROVIDER_SITE_OTHER)
Admission: RE | Admit: 2013-12-16 | Discharge: 2013-12-16 | Disposition: A | Discharge status: Home / Self Care | Payer: BC Managed Care – PPO | Source: Ambulatory Visit | Attending: Internal Medicine | Admitting: Internal Medicine

## 2013-12-16 DIAGNOSIS — R109 Unspecified abdominal pain: Secondary | ICD-10-CM

## 2013-12-16 MED ORDER — IOHEXOL 300 MG/ML  SOLN
100.0000 mL | Freq: Once | INTRAMUSCULAR | Status: AC | PRN
  Administered 2013-12-16: 100 mL via INTRAVENOUS

## 2013-12-24 ENCOUNTER — Encounter: Payer: Self-pay | Admitting: Internal Medicine

## 2013-12-30 ENCOUNTER — Ambulatory Visit: Payer: BC Managed Care – PPO | Admitting: Internal Medicine

## 2014-01-13 ENCOUNTER — Encounter: Payer: Self-pay | Admitting: Internal Medicine

## 2014-01-19 ENCOUNTER — Other Ambulatory Visit: Payer: No Typology Code available for payment source

## 2014-01-20 ENCOUNTER — Other Ambulatory Visit: Payer: No Typology Code available for payment source

## 2014-01-27 ENCOUNTER — Ambulatory Visit: Payer: No Typology Code available for payment source | Admitting: Internal Medicine

## 2014-01-29 ENCOUNTER — Encounter: Payer: Self-pay | Admitting: Internal Medicine

## 2014-01-31 ENCOUNTER — Other Ambulatory Visit: Payer: Self-pay | Admitting: *Deleted

## 2014-01-31 MED ORDER — CITALOPRAM HYDROBROMIDE 20 MG PO TABS: 20.0000 mg | ORAL_TABLET | Freq: Every day | ORAL | Status: DC

## 2014-02-02 ENCOUNTER — Encounter: Payer: Self-pay | Admitting: Internal Medicine

## 2014-02-03 ENCOUNTER — Other Ambulatory Visit: Payer: Self-pay | Admitting: *Deleted

## 2014-02-03 MED ORDER — CITALOPRAM HYDROBROMIDE 40 MG PO TABS: 40.0000 mg | ORAL_TABLET | Freq: Every day | ORAL | Status: DC

## 2014-02-17 ENCOUNTER — Encounter: Payer: Self-pay | Admitting: Internal Medicine

## 2014-02-17 ENCOUNTER — Other Ambulatory Visit: Payer: Self-pay | Admitting: *Deleted

## 2014-02-17 MED ORDER — CYCLOBENZAPRINE HCL 10 MG PO TABS: 10.0000 mg | ORAL_TABLET | Freq: Three times a day (TID) | ORAL | Status: DC | PRN

## 2014-02-26 ENCOUNTER — Encounter: Payer: Self-pay | Admitting: Internal Medicine

## 2014-02-28 ENCOUNTER — Other Ambulatory Visit: Payer: Self-pay | Admitting: *Deleted

## 2014-02-28 MED ORDER — CYCLOBENZAPRINE HCL 10 MG PO TABS: 10.0000 mg | ORAL_TABLET | Freq: Three times a day (TID) | ORAL | Status: DC | PRN

## 2014-03-08 ENCOUNTER — Telehealth: Payer: Self-pay | Admitting: *Deleted

## 2014-03-08 NOTE — Telephone Encounter (Signed)
I do not prescribe abilify.  She is going to have to f/u with psychiatry.  They prescribe this medication.

## 2014-03-08 NOTE — Telephone Encounter (Signed)
Refill Request  Abilify 10 mg  Take one tablet by mouth once daily

## 2014-03-08 NOTE — Telephone Encounter (Signed)
Left voicemail with [facility] to notify them that Dr. Nicki Reaper does not prescribe this medication

## 2014-03-08 NOTE — Telephone Encounter (Signed)
Ok to fill 

## 2014-04-19 ENCOUNTER — Telehealth: Payer: Self-pay | Admitting: Internal Medicine

## 2014-04-19 NOTE — Telephone Encounter (Signed)
Already received request via fax, however pt hasn't been seen since October. Request has been sent to Dr. Nicki Reaper for approval.

## 2014-04-19 NOTE — Telephone Encounter (Signed)
States she needs propranolol and clopidogrel.

## 2014-04-19 NOTE — Telephone Encounter (Signed)
See note below-please contact patient to schedule an appointment. Rx has been faxed

## 2014-04-19 NOTE — Telephone Encounter (Signed)
She needs a physical scheduled with me.  She is overdue f/u and is now due a physical.  I will refill until appt.  Will not continue to refill if she does not show for appt.  rx in your box.

## 2014-04-21 NOTE — Telephone Encounter (Signed)
Attempted to call pt, no answer, no vm

## 2014-04-21 NOTE — Telephone Encounter (Signed)
See mychart messages, pt scheduled physical for September.

## 2014-04-21 NOTE — Telephone Encounter (Signed)
Sent mychart message

## 2014-05-10 ENCOUNTER — Other Ambulatory Visit: Payer: Self-pay | Admitting: Internal Medicine

## 2014-05-11 ENCOUNTER — Other Ambulatory Visit: Payer: Self-pay | Admitting: *Deleted

## 2014-05-11 MED ORDER — CITALOPRAM HYDROBROMIDE 40 MG PO TABS: 40.0000 mg | ORAL_TABLET | Freq: Every day | ORAL | Status: DC

## 2014-05-11 NOTE — Telephone Encounter (Signed)
There is an interaction between her Citalopram and Trazodone. Given that she has not been seen (cancelled her March visit), I would set up follow up visit next week prior to refill.

## 2014-05-11 NOTE — Telephone Encounter (Signed)
Okay to refill? Pt states that she is almost out (Rx will need to be printed, signed, & faxed). Pt was last seen by Dr. Nicki Reaper in October & cancelled her March appt. Next appt scheduled for September. Please advise

## 2014-05-11 NOTE — Telephone Encounter (Signed)
I have called in a 30 day supply & I have notified pt via mychart that she must call the office AND be seen before she runs out of her 30 day supply.

## 2014-05-12 ENCOUNTER — Encounter: Payer: Self-pay | Admitting: Internal Medicine

## 2014-05-12 ENCOUNTER — Telehealth: Payer: Self-pay | Admitting: Internal Medicine

## 2014-05-12 NOTE — Telephone Encounter (Signed)
I can see her 06/02/14 at 12:00.  Needs to keep this appt.

## 2014-05-12 NOTE — Addendum Note (Signed)
Addended by: Wynonia Lawman E on: 05/12/2014 10:06 AM   Modules accepted: Orders

## 2014-05-12 NOTE — Telephone Encounter (Signed)
Sorry.  I am confused. Please confirm that she is off trazodone.  If she is off trazodone and only taking citalopram, then ok to refill citaolpram x 1 month.  She will have to have an appt before the next refill.  Will not refill any more until she is evaluated in the office.

## 2014-05-12 NOTE — Telephone Encounter (Signed)
Patient called to make an appt for med refill. Next available appt is 8/11. Patient stated that Dr. Nicki Reaper wanted to see her with in a month. Patient stated that she will be out of meds (citalopram, cyclobenzaprine) in 30 days.Please advise/msn

## 2014-05-13 NOTE — Telephone Encounter (Signed)
The patient has been scheduled

## 2014-05-13 NOTE — Telephone Encounter (Signed)
Pt notified and verbalized understanding.   Lorriane Shire, could you schedule pt in that slot, please? Thanks

## 2014-06-01 ENCOUNTER — Other Ambulatory Visit: Payer: Self-pay | Admitting: *Deleted

## 2014-06-01 NOTE — Telephone Encounter (Signed)
A user error has taken place.

## 2014-06-02 ENCOUNTER — Encounter: Payer: Self-pay | Admitting: Internal Medicine

## 2014-06-02 ENCOUNTER — Ambulatory Visit (INDEPENDENT_AMBULATORY_CARE_PROVIDER_SITE_OTHER): Payer: Managed Care, Other (non HMO) | Admitting: Internal Medicine

## 2014-06-02 VITALS — BP 120/60 | HR 73 | Temp 99.1°F | Ht 64.0 in | Wt 246.8 lb

## 2014-06-02 DIAGNOSIS — D649 Anemia, unspecified: Secondary | ICD-10-CM

## 2014-06-02 DIAGNOSIS — I2510 Atherosclerotic heart disease of native coronary artery without angina pectoris: Secondary | ICD-10-CM

## 2014-06-02 DIAGNOSIS — F3289 Other specified depressive episodes: Secondary | ICD-10-CM

## 2014-06-02 DIAGNOSIS — K219 Gastro-esophageal reflux disease without esophagitis: Secondary | ICD-10-CM

## 2014-06-02 DIAGNOSIS — F329 Major depressive disorder, single episode, unspecified: Secondary | ICD-10-CM

## 2014-06-02 DIAGNOSIS — E119 Type 2 diabetes mellitus without complications: Secondary | ICD-10-CM

## 2014-06-02 DIAGNOSIS — K5730 Diverticulosis of large intestine without perforation or abscess without bleeding: Secondary | ICD-10-CM

## 2014-06-02 DIAGNOSIS — J4489 Other specified chronic obstructive pulmonary disease: Secondary | ICD-10-CM

## 2014-06-02 DIAGNOSIS — J449 Chronic obstructive pulmonary disease, unspecified: Secondary | ICD-10-CM

## 2014-06-02 DIAGNOSIS — F32A Depression, unspecified: Secondary | ICD-10-CM

## 2014-06-02 DIAGNOSIS — G4733 Obstructive sleep apnea (adult) (pediatric): Secondary | ICD-10-CM

## 2014-06-02 DIAGNOSIS — I10 Essential (primary) hypertension: Secondary | ICD-10-CM

## 2014-06-02 DIAGNOSIS — E7800 Pure hypercholesterolemia, unspecified: Secondary | ICD-10-CM

## 2014-06-02 NOTE — Progress Notes (Signed)
Pre visit review using our clinic review tool, if applicable. No additional management support is needed unless otherwise documented below in the visit note. 

## 2014-06-03 ENCOUNTER — Encounter: Payer: Self-pay | Admitting: Internal Medicine

## 2014-06-03 MED ORDER — CYCLOBENZAPRINE HCL 10 MG PO TABS: 10.0000 mg | ORAL_TABLET | Freq: Three times a day (TID) | ORAL | Status: DC | PRN

## 2014-06-06 ENCOUNTER — Encounter: Payer: Self-pay | Admitting: Internal Medicine

## 2014-06-06 NOTE — Assessment & Plan Note (Signed)
Had GI evaluation.  (colonoscopy and EGD).  Follow cbc.

## 2014-06-06 NOTE — Assessment & Plan Note (Signed)
Continues on Crestor.  Low cholesterol diet.  Follow lipid panel and liver function.

## 2014-06-06 NOTE — Assessment & Plan Note (Signed)
S/P stent placement.  Has been followed by cardiology.  She feels from a cardiac standpoint things are stable.  Continue risk factor modification.

## 2014-06-06 NOTE — Assessment & Plan Note (Signed)
Had a recent EGD.  See report for details.  Stop arthrotec.

## 2014-06-06 NOTE — Assessment & Plan Note (Signed)
Documented on colonoscopy 2010.  Had f/u colonoscopy 12/02/13 - diverticulosis.  Currently bowels stable.

## 2014-06-06 NOTE — Assessment & Plan Note (Signed)
Continue CPAP.  

## 2014-06-06 NOTE — Assessment & Plan Note (Signed)
On citalopram.  Has trazodone - she takes at night.  Was seeing psychiatry.  Increased stress and depression related to her mother's medical issues.  We discussed the need to get back in with psychiatry asap.  She will call today or tomorrow.  Has previously had suicidal thoughts.  None now.  Gave her the number for Crisis Control (through [employer]).

## 2014-06-06 NOTE — Progress Notes (Signed)
Subjective:    Patient ID: Kathryn Friedman, female    DOB: 02-21-1956, 58 y.o.   MRN: [id number]  HPI 58 year old female with past history of hypertension, hypercholesterolemia, CAD s/p stent placement and diabetes.  She comes in today for a scheduled follow up.   Still smoking.  No desire to quit smoking at this time.  We discussed the need to quit.  She feels from a cardiac standpoint - things are stable.  No chest pain or tightness.  No palpitations.  Has been under increased stress with her mother's health issues.  Some increased depression related to this.  Was seeing psychiatry.  She reports previously told they did not accept her insurance.  She is still on Citalopram.  Uses trazodone to help her sleep.   No suicidal ideations now.  She has had some suicidal thoughts previously.  We discussed this at length today.  Discussed the need to get back in with counseling and psychiatry.  [facility] gave her numbers to call (numbers that would accept her insurance).  We discussed the need to call today or tomorrow.  States she will call.  No nausea or vomiting.  Eating and drinking well.  No acid reflux.  We discussed the need to stop arthrotec.       Past Medical History  Diagnosis Date  . Hypertension   . Depression     secondary to the death of her husband (died 71)  . COPD (chronic obstructive pulmonary disease)   . Diabetes mellitus without complication Diagnosed 43/3295  . Sleep apnea     on CPAP  . Spastic colon   . Coronary artery disease     s/p stent placement 06/25/06  . Hypertriglyceridemia   . Asthma   . GERD (gastroesophageal reflux disease)   . Anemia   . Arthritis     back and knees  . Diverticulitis     Current Outpatient Prescriptions on File Prior to Visit  Medication Sig Dispense Refill  . acetaminophen (TYLENOL) 500 MG tablet Take 500 mg by mouth every 6 (six) hours as needed for pain.      Marland Kitchen albuterol (VENTOLIN HFA) 108 (90 BASE) MCG/ACT inhaler Inhale 2 puffs into  the lungs every 6 (six) hours as needed.  18 g  1  . amLODipine (NORVASC) 5 MG tablet Take 1 tablet (5 mg total) by mouth daily.  90 tablet  1  . aspirin 81 MG tablet Take 81 mg by mouth daily. Take 1 tablet by mouth once a day as directed      . Calcium Carbonate-Vitamin D (CALCIUM 600 + D PO) Take 600 mg by mouth daily.      . citalopram (CELEXA) 40 MG tablet Take 1 tablet (40 mg total) by mouth daily.  30 tablet  0  . clopidogrel (PLAVIX) 75 MG tablet Take 1 tablet (75 mg total) by mouth daily.  90 tablet  1  . Diclofenac-Misoprostol (ARTHROTEC) 75-0.2 MG TBEC Take 75 mg by mouth 2 (two) times daily.  60 tablet  5  . Fluticasone-Salmeterol (ADVAIR) 250-50 MCG/DOSE AEPB Inhale 1 puff into the lungs every 12 (twelve) hours. Inhale 1 puff twice a day      . metFORMIN (GLUCOPHAGE) 500 MG tablet Take 1 tablet (500 mg total) by mouth 2 (two) times daily with a meal.  60 tablet  5  . pantoprazole (PROTONIX) 40 MG tablet Take 1 tablet (40 mg total) by mouth daily. Take 1 tablet by mouth  once a day  90 tablet  1  . propranolol (INDERAL) 20 MG tablet Take 2 tablets (40 mg total) by mouth 2 (two) times daily.  360 tablet  1  . rosuvastatin (CRESTOR) 40 MG tablet Take 20 mg by mouth daily.        No current facility-administered medications on file prior to visit.    Review of Systems Patient denies any headache, lightheadedness or dizziness.  No sinus or allergy symptoms.  No chest pain, tightness or palpitations.  No increased cough or congestion.   Using her inhalers regularly as outlined.  Feels from a cardiac standpoint and pulmonary standpoint - stable.  No nausea or vomiting.  No acid reflux.  No abdominal pain or cramping.  No constipation, BRBPR or melana.  No diarrhea reported.  Increased stress and depression as outlined.  Discussed the need to quit smoking.        Objective:   Physical Exam  Filed Vitals:   06/02/14 1150  BP: 120/60  Pulse: 73  Temp: 99.1 F (37.3 C)   Blood pressure  recheck:  84/40  57 year old female in no acute distress.   HEENT:  Nares- clear.  Oropharynx - without lesions. NECK:  Supple.  Nontender.  No audible bruit.  HEART:  Appears to be regular. LUNGS:  No crackles or wheezing audible.  Respirations even and unlabored.  RADIAL PULSE:  Equal bilaterally.    ABDOMEN:  Soft, nontender.  Bowel sounds present and normal.  No audible abdominal bruit.   EXTREMITIES:  No increased edema present.  DP pulses palpable and equal bilaterally.          Assessment & Plan:  MSK.  Evaluated by ortho.  They recommended her staying on Flexeril for her back.  Follow.     HEALTH MAINTENANCE.  Physical 04/08/13.    Colonoscopy 12/02/13 - diverticulosis.  Mammogram 07/29/13 - BiRADS I.  Schedule her for a physical.    I spent 25 minutes with the patient and more than 50% of the time was spent in consultation regarding the above.

## 2014-06-06 NOTE — Assessment & Plan Note (Signed)
Blood pressure is under good control.  Follow.

## 2014-06-06 NOTE — Assessment & Plan Note (Signed)
Not checking sugars.  Low carb diet.  Follow metabolic panel and I2L.

## 2014-06-06 NOTE — Assessment & Plan Note (Signed)
Has previously been seeing Dr Raul Del.  Remains on Advair and Ventolin.  Breathing stable.

## 2014-06-07 ENCOUNTER — Telehealth: Payer: Self-pay | Admitting: Internal Medicine

## 2014-06-08 ENCOUNTER — Other Ambulatory Visit: Payer: Self-pay | Admitting: *Deleted

## 2014-06-08 ENCOUNTER — Other Ambulatory Visit: Payer: Managed Care, Other (non HMO)

## 2014-06-08 MED ORDER — CITALOPRAM HYDROBROMIDE 40 MG PO TABS: 40.0000 mg | ORAL_TABLET | Freq: Every day | ORAL | Status: DC

## 2014-06-08 NOTE — Telephone Encounter (Signed)
Rx phoned in & pt notified via mychart

## 2014-06-08 NOTE — Telephone Encounter (Signed)
I ok'd citalopram #90 with one refill for pt to send to [place].

## 2014-06-10 ENCOUNTER — Other Ambulatory Visit (INDEPENDENT_AMBULATORY_CARE_PROVIDER_SITE_OTHER): Payer: Managed Care, Other (non HMO)

## 2014-06-10 DIAGNOSIS — G4733 Obstructive sleep apnea (adult) (pediatric): Secondary | ICD-10-CM

## 2014-06-10 DIAGNOSIS — F3289 Other specified depressive episodes: Secondary | ICD-10-CM

## 2014-06-10 DIAGNOSIS — I10 Essential (primary) hypertension: Secondary | ICD-10-CM

## 2014-06-10 DIAGNOSIS — D649 Anemia, unspecified: Secondary | ICD-10-CM

## 2014-06-10 DIAGNOSIS — E7800 Pure hypercholesterolemia, unspecified: Secondary | ICD-10-CM

## 2014-06-10 DIAGNOSIS — E119 Type 2 diabetes mellitus without complications: Secondary | ICD-10-CM

## 2014-06-10 DIAGNOSIS — F329 Major depressive disorder, single episode, unspecified: Secondary | ICD-10-CM

## 2014-06-10 DIAGNOSIS — F32A Depression, unspecified: Secondary | ICD-10-CM

## 2014-06-10 LAB — HEMOGLOBIN A1C: Hgb A1c MFr Bld: 6.8 % — ABNORMAL HIGH (ref 4.6–6.5)

## 2014-06-10 LAB — FERRITIN: FERRITIN: 30.4 ng/mL (ref 10.0–291.0)

## 2014-06-10 LAB — LIPID PANEL
CHOLESTEROL: 117 mg/dL (ref 0–200)
HDL: 40.8 mg/dL (ref >39.00)
LDL Cholesterol: 54 mg/dL (ref 0–99)
NonHDL: 76.2
TRIGLYCERIDES: 112 mg/dL (ref 0.0–149.0)
Total CHOL/HDL Ratio: 3
VLDL: 22.4 mg/dL (ref 0.0–40.0)

## 2014-06-10 LAB — MICROALBUMIN / CREATININE URINE RATIO
Creatinine,U: 70.3 mg/dL
MICROALB UR: 0.1 mg/dL (ref 0.0–1.9)
Microalb Creat Ratio: 0.1 mg/g (ref 0.0–30.0)

## 2014-06-10 LAB — CBC WITH DIFFERENTIAL/PLATELET
BASOS ABS: 0 10*3/uL (ref 0.0–0.1)
Basophils Relative: 0.4 % (ref 0.0–3.0)
EOS PCT: 2.4 % (ref 0.0–5.0)
Eosinophils Absolute: 0.3 10*3/uL (ref 0.0–0.7)
HCT: 43.6 % (ref 36.0–46.0)
Hemoglobin: 14.4 g/dL (ref 12.0–15.0)
LYMPHS PCT: 18.1 % (ref 12.0–46.0)
Lymphs Abs: 2.1 10*3/uL (ref 0.7–4.0)
MCHC: 32.9 g/dL (ref 30.0–36.0)
MCV: 87.9 fl (ref 78.0–100.0)
MONOS PCT: 4 % (ref 3.0–12.0)
Monocytes Absolute: 0.5 10*3/uL (ref 0.1–1.0)
NEUTROS ABS: 8.9 10*3/uL — AB (ref 1.4–7.7)
Neutrophils Relative %: 75.1 % (ref 43.0–77.0)
Platelets: 198 10*3/uL (ref 150.0–400.0)
RBC: 4.97 Mil/uL (ref 3.87–5.11)
RDW: 19.9 % — AB (ref 11.5–15.5)
WBC: 11.8 10*3/uL — ABNORMAL HIGH (ref 4.0–10.5)

## 2014-06-10 LAB — HEPATIC FUNCTION PANEL
ALBUMIN: 3.5 g/dL (ref 3.5–5.2)
ALT: 17 U/L (ref 0–35)
AST: 18 U/L (ref 0–37)
Alkaline Phosphatase: 60 U/L (ref 39–117)
BILIRUBIN TOTAL: 0.6 mg/dL (ref 0.2–1.2)
Bilirubin, Direct: 0.2 mg/dL (ref 0.0–0.3)
TOTAL PROTEIN: 6.5 g/dL (ref 6.0–8.3)

## 2014-06-10 LAB — BASIC METABOLIC PANEL
BUN: 7 mg/dL (ref 6–23)
CALCIUM: 9.1 mg/dL (ref 8.4–10.5)
CO2: 27 mEq/L (ref 19–32)
Chloride: 102 mEq/L (ref 96–112)
Creatinine, Ser: 0.7 mg/dL (ref 0.4–1.2)
GFR: 85.6 mL/min (ref >60.00)
Glucose, Bld: 133 mg/dL — ABNORMAL HIGH (ref 70–99)
Potassium: 4 mEq/L (ref 3.5–5.1)
Sodium: 136 mEq/L (ref 135–145)

## 2014-06-10 LAB — TSH: TSH: 1.02 u[IU]/mL (ref 0.35–4.50)

## 2014-06-11 ENCOUNTER — Encounter: Payer: Self-pay | Admitting: Internal Medicine

## 2014-06-13 ENCOUNTER — Encounter: Payer: Self-pay | Admitting: Internal Medicine

## 2014-06-13 ENCOUNTER — Other Ambulatory Visit: Payer: Self-pay | Admitting: Internal Medicine

## 2014-06-13 DIAGNOSIS — D72829 Elevated white blood cell count, unspecified: Secondary | ICD-10-CM

## 2014-06-13 NOTE — Progress Notes (Signed)
Spoke with pt advised of lab appointment

## 2014-06-13 NOTE — Progress Notes (Signed)
Order placed for f/u [facility].   

## 2014-06-14 ENCOUNTER — Ambulatory Visit: Payer: Self-pay

## 2014-06-14 LAB — PULMONARY FUNCTION TEST

## 2014-06-17 ENCOUNTER — Telehealth: Payer: Self-pay | Admitting: *Deleted

## 2014-06-17 NOTE — Telephone Encounter (Signed)
Chart reviewed for DM bundle.  Last OV 06/02/14. BP 120/60 LDL and a1c done, within parameters.

## 2014-06-20 ENCOUNTER — Other Ambulatory Visit: Payer: Self-pay | Admitting: *Deleted

## 2014-06-20 MED ORDER — AMLODIPINE BESYLATE 5 MG PO TABS: 5.0000 mg | ORAL_TABLET | Freq: Every day | ORAL | Status: DC

## 2014-06-27 ENCOUNTER — Other Ambulatory Visit (INDEPENDENT_AMBULATORY_CARE_PROVIDER_SITE_OTHER): Payer: Managed Care, Other (non HMO)

## 2014-06-27 ENCOUNTER — Telehealth: Payer: Self-pay | Admitting: Internal Medicine

## 2014-06-27 ENCOUNTER — Encounter: Payer: Self-pay | Admitting: Internal Medicine

## 2014-06-27 DIAGNOSIS — I10 Essential (primary) hypertension: Secondary | ICD-10-CM

## 2014-06-27 DIAGNOSIS — E871 Hypo-osmolality and hyponatremia: Secondary | ICD-10-CM

## 2014-06-27 DIAGNOSIS — E7800 Pure hypercholesterolemia, unspecified: Secondary | ICD-10-CM

## 2014-06-27 DIAGNOSIS — K5730 Diverticulosis of large intestine without perforation or abscess without bleeding: Secondary | ICD-10-CM

## 2014-06-27 DIAGNOSIS — R739 Hyperglycemia, unspecified: Secondary | ICD-10-CM

## 2014-06-27 LAB — CBC WITH DIFFERENTIAL/PLATELET
BASOS ABS: 0 10*3/uL (ref 0.0–0.1)
Basophils Relative: 0.2 % (ref 0.0–3.0)
EOS PCT: 2.6 % (ref 0.0–5.0)
Eosinophils Absolute: 0.2 10*3/uL (ref 0.0–0.7)
HEMATOCRIT: 42.8 % (ref 36.0–46.0)
Hemoglobin: 14.2 g/dL (ref 12.0–15.0)
LYMPHS PCT: 18.7 % (ref 12.0–46.0)
Lymphs Abs: 1.7 10*3/uL (ref 0.7–4.0)
MCHC: 33.1 g/dL (ref 30.0–36.0)
MCV: 88.3 fl (ref 78.0–100.0)
MONOS PCT: 4 % (ref 3.0–12.0)
Monocytes Absolute: 0.4 10*3/uL (ref 0.1–1.0)
Neutro Abs: 6.9 10*3/uL (ref 1.4–7.7)
Neutrophils Relative %: 74.5 % (ref 43.0–77.0)
PLATELETS: 170 10*3/uL (ref 150.0–400.0)
RBC: 4.85 Mil/uL (ref 3.87–5.11)
RDW: 18.4 % — ABNORMAL HIGH (ref 11.5–15.5)
WBC: 9.3 10*3/uL (ref 4.0–10.5)

## 2014-06-27 LAB — LIPID PANEL
CHOL/HDL RATIO: 3
Cholesterol: 104 mg/dL (ref 0–200)
HDL: 35.3 mg/dL — AB (ref >39.00)
LDL Cholesterol: 40 mg/dL (ref 0–99)
NONHDL: 68.7
TRIGLYCERIDES: 144 mg/dL (ref 0.0–149.0)
VLDL: 28.8 mg/dL (ref 0.0–40.0)

## 2014-06-27 LAB — BASIC METABOLIC PANEL
BUN: 8 mg/dL (ref 6–23)
CHLORIDE: 100 meq/L (ref 96–112)
CO2: 24 mEq/L (ref 19–32)
Calcium: 8.8 mg/dL (ref 8.4–10.5)
Creatinine, Ser: 0.8 mg/dL (ref 0.4–1.2)
GFR: 77.11 mL/min (ref >60.00)
Glucose, Bld: 113 mg/dL — ABNORMAL HIGH (ref 70–99)
POTASSIUM: 3.9 meq/L (ref 3.5–5.1)
Sodium: 134 mEq/L — ABNORMAL LOW (ref 135–145)

## 2014-06-27 LAB — HEPATIC FUNCTION PANEL
ALBUMIN: 3.6 g/dL (ref 3.5–5.2)
ALT: 14 U/L (ref 0–35)
AST: 31 U/L (ref 0–37)
Alkaline Phosphatase: 50 U/L (ref 39–117)
Bilirubin, Direct: 0.1 mg/dL (ref 0.0–0.3)
TOTAL PROTEIN: 7.1 g/dL (ref 6.0–8.3)
Total Bilirubin: 0.3 mg/dL (ref 0.2–1.2)

## 2014-06-27 LAB — FERRITIN: FERRITIN: 34.9 ng/mL (ref 10.0–291.0)

## 2014-06-27 NOTE — Telephone Encounter (Signed)
Pt notified of lab results via my chart.  Needs a fasting lab within the next 2 weeks.  Please schedule and contact her with an appt date and time.  Thanks.

## 2014-06-28 ENCOUNTER — Other Ambulatory Visit: Payer: Self-pay | Admitting: *Deleted

## 2014-06-28 MED ORDER — AMLODIPINE BESYLATE 5 MG PO TABS: 5.0000 mg | ORAL_TABLET | Freq: Every day | ORAL | Status: DC

## 2014-06-29 ENCOUNTER — Encounter: Payer: Self-pay | Admitting: Internal Medicine

## 2014-07-01 ENCOUNTER — Encounter: Payer: Self-pay | Admitting: Internal Medicine

## 2014-07-07 ENCOUNTER — Encounter: Payer: Self-pay | Admitting: Internal Medicine

## 2014-07-08 ENCOUNTER — Other Ambulatory Visit: Payer: Self-pay | Admitting: *Deleted

## 2014-07-08 MED ORDER — METFORMIN HCL 500 MG PO TABS: 500.0000 mg | ORAL_TABLET | Freq: Two times a day (BID) | ORAL | Status: DC

## 2014-07-14 ENCOUNTER — Other Ambulatory Visit: Payer: Managed Care, Other (non HMO)

## 2014-07-15 ENCOUNTER — Other Ambulatory Visit: Payer: Managed Care, Other (non HMO)

## 2014-07-18 ENCOUNTER — Other Ambulatory Visit: Payer: Managed Care, Other (non HMO)

## 2014-07-27 ENCOUNTER — Encounter: Payer: BC Managed Care – PPO | Admitting: Internal Medicine

## 2014-08-07 ENCOUNTER — Inpatient Hospital Stay: Payer: Self-pay | Admitting: Internal Medicine

## 2014-08-07 DIAGNOSIS — K922 Gastrointestinal hemorrhage, unspecified: Secondary | ICD-10-CM | POA: Insufficient documentation

## 2014-08-07 LAB — COMPREHENSIVE METABOLIC PANEL
AST: 35 U/L (ref 15–37)
Albumin: 3.1 g/dL — ABNORMAL LOW (ref 3.4–5.0)
Alkaline Phosphatase: 67 U/L
Anion Gap: 6 — ABNORMAL LOW (ref 7–16)
BUN: 8 mg/dL (ref 7–18)
Bilirubin,Total: 0.4 mg/dL (ref 0.2–1.0)
CREATININE: 0.76 mg/dL (ref 0.60–1.30)
Calcium, Total: 8.5 mg/dL (ref 8.5–10.1)
Chloride: 109 mmol/L — ABNORMAL HIGH (ref 98–107)
Co2: 24 mmol/L (ref 21–32)
EGFR (African American): >60
EGFR (Non-African Amer.): >60
GLUCOSE: 118 mg/dL — AB (ref 65–99)
Osmolality: 277 (ref 275–301)
POTASSIUM: 3.5 mmol/L (ref 3.5–5.1)
SGPT (ALT): 30 U/L
Sodium: 139 mmol/L (ref 136–145)
Total Protein: 6.9 g/dL (ref 6.4–8.2)

## 2014-08-07 LAB — CBC
HCT: 43.5 % (ref 35.0–47.0)
HGB: 14 g/dL (ref 12.0–16.0)
MCH: 29.2 pg (ref 26.0–34.0)
MCHC: 32.1 g/dL (ref 32.0–36.0)
MCV: 91 fL (ref 80–100)
Platelet: 186 10*3/uL (ref 150–440)
RBC: 4.79 10*6/uL (ref 3.80–5.20)
RDW: 17.8 % — ABNORMAL HIGH (ref 11.5–14.5)
WBC: 10.2 10*3/uL (ref 3.6–11.0)

## 2014-08-07 LAB — CLOSTRIDIUM DIFFICILE(ARMC)

## 2014-08-07 LAB — HEMATOCRIT: HCT: 41.4 % (ref 35.0–47.0)

## 2014-08-08 ENCOUNTER — Telehealth: Payer: Self-pay | Admitting: Internal Medicine

## 2014-08-08 LAB — CBC WITH DIFFERENTIAL/PLATELET
BASOS ABS: 0.1 10*3/uL (ref 0.0–0.1)
Basophil %: 0.7 %
EOS ABS: 0.3 10*3/uL (ref 0.0–0.7)
EOS PCT: 2.8 %
HCT: 41 % (ref 35.0–47.0)
HGB: 13.3 g/dL (ref 12.0–16.0)
LYMPHS ABS: 1.6 10*3/uL (ref 1.0–3.6)
Lymphocyte %: 18.3 %
MCH: 29.2 pg (ref 26.0–34.0)
MCHC: 32.3 g/dL (ref 32.0–36.0)
MCV: 90 fL (ref 80–100)
Monocyte #: 0.4 x10 3/mm (ref 0.2–0.9)
Monocyte %: 4.7 %
NEUTROS PCT: 73.5 %
Neutrophil #: 6.5 10*3/uL (ref 1.4–6.5)
Platelet: 151 10*3/uL (ref 150–440)
RBC: 4.55 10*6/uL (ref 3.80–5.20)
RDW: 17.9 % — AB (ref 11.5–14.5)
WBC: 8.9 10*3/uL (ref 3.6–11.0)

## 2014-08-08 LAB — BASIC METABOLIC PANEL
Anion Gap: 8 (ref 7–16)
BUN: 5 mg/dL — AB (ref 7–18)
Calcium, Total: 8 mg/dL — ABNORMAL LOW (ref 8.5–10.1)
Chloride: 107 mmol/L (ref 98–107)
Co2: 25 mmol/L (ref 21–32)
Creatinine: 0.75 mg/dL (ref 0.60–1.30)
EGFR (Non-African Amer.): >60
Glucose: 127 mg/dL — ABNORMAL HIGH (ref 65–99)
OSMOLALITY: 278 (ref 275–301)
Potassium: 3.7 mmol/L (ref 3.5–5.1)
SODIUM: 140 mmol/L (ref 136–145)

## 2014-08-08 LAB — SEDIMENTATION RATE: ERYTHROCYTE SED RATE: 18 mm/h (ref 0–30)

## 2014-08-08 NOTE — Telephone Encounter (Signed)
The patient is needing a hospital follow GI bleed.

## 2014-08-09 LAB — STOOL CULTURE

## 2014-08-09 NOTE — Telephone Encounter (Signed)
Records received & placed in green folder (copy given to [place] also)

## 2014-08-09 NOTE — Telephone Encounter (Signed)
Records requested

## 2014-08-09 NOTE — Telephone Encounter (Signed)
Duplicate. See other message.

## 2014-08-09 NOTE — Telephone Encounter (Signed)
I can see her 08/19/14 at 11:45 for hospital f/u (if no one else schedule to be there).  Thanks.

## 2014-08-09 NOTE — Telephone Encounter (Signed)
Need hospital records and need to know how pt doing to know urgency (of when needs f/u appt).  Thanks.

## 2014-08-09 NOTE — Telephone Encounter (Signed)
Pt states that she is feeling better & she was informed that [facility]my[facility] will be giving her a follow-up phone call today or tomorrow.

## 2014-08-10 NOTE — Telephone Encounter (Signed)
Received a call this morning from [facility] stating that patient reported bloody diarrhea again this morning & noticed blood when wiping. Pt is currently on Amoxicillin & reports no abdominal pain or cramping. Pt needs to be seen today. After consulting with Dr. Nicki Reaper, I called Dr. Aurora Mask office & spoke with Jenny Reichmann. I gave her the same information & she notified me that they will contact the patient this morning. I called patient & notified her to expect their call. I also told her to contact them if she doesn't hear from them by lunch time. (Seen in hospital for diverticulitis).

## 2014-08-10 NOTE — Telephone Encounter (Signed)
LMTCB for appt on 9/25.msn

## 2014-08-10 NOTE — Telephone Encounter (Signed)
Noted  

## 2014-08-11 DIAGNOSIS — R197 Diarrhea, unspecified: Secondary | ICD-10-CM | POA: Insufficient documentation

## 2014-08-11 DIAGNOSIS — K921 Melena: Secondary | ICD-10-CM | POA: Insufficient documentation

## 2014-08-11 DIAGNOSIS — K760 Fatty (change of) liver, not elsewhere classified: Secondary | ICD-10-CM | POA: Insufficient documentation

## 2014-08-19 ENCOUNTER — Ambulatory Visit: Payer: Managed Care, Other (non HMO) | Admitting: Internal Medicine

## 2014-08-24 ENCOUNTER — Other Ambulatory Visit: Payer: Self-pay | Admitting: *Deleted

## 2014-08-24 MED ORDER — CYCLOBENZAPRINE HCL 10 MG PO TABS: 10.0000 mg | ORAL_TABLET | Freq: Three times a day (TID) | ORAL | Status: DC | PRN

## 2014-08-24 NOTE — Telephone Encounter (Signed)
Refill

## 2014-08-24 NOTE — Telephone Encounter (Signed)
Faxed to pharmacy

## 2014-08-24 NOTE — Telephone Encounter (Signed)
ok'd refill flexeril #90 with one refill.

## 2014-08-26 ENCOUNTER — Other Ambulatory Visit: Payer: Managed Care, Other (non HMO)

## 2014-08-29 ENCOUNTER — Other Ambulatory Visit: Payer: Managed Care, Other (non HMO)

## 2014-09-02 ENCOUNTER — Other Ambulatory Visit: Payer: Managed Care, Other (non HMO)

## 2014-09-02 ENCOUNTER — Encounter: Payer: Managed Care, Other (non HMO) | Admitting: Internal Medicine

## 2014-09-05 ENCOUNTER — Encounter: Payer: Managed Care, Other (non HMO) | Admitting: Internal Medicine

## 2014-09-07 ENCOUNTER — Other Ambulatory Visit: Payer: Self-pay | Admitting: Internal Medicine

## 2014-09-09 ENCOUNTER — Encounter: Payer: Self-pay | Admitting: Internal Medicine

## 2014-09-09 ENCOUNTER — Ambulatory Visit (INDEPENDENT_AMBULATORY_CARE_PROVIDER_SITE_OTHER): Payer: Self-pay | Admitting: Internal Medicine

## 2014-09-09 VITALS — BP 130/70 | HR 73 | Temp 98.5°F | Ht 64.5 in | Wt 254.8 lb

## 2014-09-09 DIAGNOSIS — I2510 Atherosclerotic heart disease of native coronary artery without angina pectoris: Secondary | ICD-10-CM

## 2014-09-09 DIAGNOSIS — D509 Iron deficiency anemia, unspecified: Secondary | ICD-10-CM

## 2014-09-09 DIAGNOSIS — E7800 Pure hypercholesterolemia, unspecified: Secondary | ICD-10-CM

## 2014-09-09 DIAGNOSIS — R197 Diarrhea, unspecified: Secondary | ICD-10-CM

## 2014-09-09 DIAGNOSIS — F329 Major depressive disorder, single episode, unspecified: Secondary | ICD-10-CM

## 2014-09-09 DIAGNOSIS — K5730 Diverticulosis of large intestine without perforation or abscess without bleeding: Secondary | ICD-10-CM

## 2014-09-09 DIAGNOSIS — Z23 Encounter for immunization: Secondary | ICD-10-CM

## 2014-09-09 DIAGNOSIS — K219 Gastro-esophageal reflux disease without esophagitis: Secondary | ICD-10-CM

## 2014-09-09 DIAGNOSIS — J449 Chronic obstructive pulmonary disease, unspecified: Secondary | ICD-10-CM

## 2014-09-09 DIAGNOSIS — I10 Essential (primary) hypertension: Secondary | ICD-10-CM

## 2014-09-09 DIAGNOSIS — E119 Type 2 diabetes mellitus without complications: Secondary | ICD-10-CM

## 2014-09-09 DIAGNOSIS — F32A Depression, unspecified: Secondary | ICD-10-CM

## 2014-09-09 DIAGNOSIS — G4733 Obstructive sleep apnea (adult) (pediatric): Secondary | ICD-10-CM

## 2014-09-09 NOTE — Progress Notes (Signed)
Pre visit review using our clinic review tool, if applicable. No additional management support is needed unless otherwise documented below in the visit note. 

## 2014-09-11 ENCOUNTER — Encounter: Payer: Self-pay | Admitting: Internal Medicine

## 2014-09-11 NOTE — Assessment & Plan Note (Signed)
On citalopram.   Was seeing psychiatry.  We discussed the need to get back in with psychiatry asap.   Has previously had suicidal thoughts.  She has the number for Crisis Control (through [employer]) and other contact numbers.  Plans to go by [facility] next week.  Assures me she will not harm herself.

## 2014-09-11 NOTE — Assessment & Plan Note (Signed)
Had GI evaluation.  (colonoscopy and EGD).  Follow cbc.

## 2014-09-11 NOTE — Assessment & Plan Note (Signed)
Has previously been seeing Dr Raul Del.  Remains on Advair and Ventolin.  Breathing stable.

## 2014-09-11 NOTE — Assessment & Plan Note (Signed)
Persistent intermittent loose stools.  No further bleeding.  Has had stool studies.  Does feel better.  Start align daily.  Follow.  May need referral back to GI.

## 2014-09-11 NOTE — Assessment & Plan Note (Signed)
Had a recent EGD.  See report for details.  Off arthrotec.  Avoid antiinflammatories.

## 2014-09-11 NOTE — Assessment & Plan Note (Signed)
Blood pressure is under good control.  Follow.

## 2014-09-11 NOTE — Assessment & Plan Note (Signed)
Not checking sugars.  Low carb diet.  Follow metabolic panel and T6Y.

## 2014-09-11 NOTE — Progress Notes (Signed)
Subjective:    Patient ID: Kathryn Friedman, female    DOB: 04-07-1956, 58 y.o.   MRN: [id number]  HPI 58 year old female with past history of hypertension, hypercholesterolemia, CAD s/p stent placement and diabetes.  She comes in today for a scheduled follow up.   Still smoking.  No desire to quit smoking at this time.  We again discussed the need to quit.  She feels from a cardiac standpoint - things are stable.  No chest pain or tightness.  No palpitations.  Has been under increased stress with her mother's health issues.  Now not speaking to her mother.  Some increased stress and depression related to this.  Was seeing psychiatry.   She is still on Citalopram.  She has had some suicidal thoughts previously. She assures me she will not kill herself.  Planning to f/u next week with [place].   We discussed this at length today.  Discussed the need to get back in with counseling and psychiatry.  No nausea or vomiting.  Eating and drinking well.  No acid reflux.  Loose stool as outlined.       Past Medical History  Diagnosis Date  . Hypertension   . Depression     secondary to the death of her husband (died 61)  . COPD (chronic obstructive pulmonary disease)   . Diabetes mellitus without complication Diagnosed 40/9811  . Sleep apnea     on CPAP  . Spastic colon   . Coronary artery disease     s/p stent placement 06/25/06  . Hypertriglyceridemia   . Asthma   . GERD (gastroesophageal reflux disease)   . Anemia   . Arthritis     back and knees  . Diverticulitis     Current Outpatient Prescriptions on File Prior to Visit  Medication Sig Dispense Refill  . acetaminophen (TYLENOL) 500 MG tablet Take 500 mg by mouth every 6 (six) hours as needed for pain.      Marland Kitchen albuterol (VENTOLIN HFA) 108 (90 BASE) MCG/ACT inhaler Inhale 2 puffs into the lungs every 6 (six) hours as needed.  18 g  1  . amLODipine (NORVASC) 5 MG tablet Take 1 tablet (5 mg total) by mouth daily.  90 tablet  1  . aspirin  81 MG tablet Take 81 mg by mouth daily. Take 1 tablet by mouth once a day as directed      . Calcium Carbonate-Vitamin D (CALCIUM 600 + D PO) Take 600 mg by mouth daily.      . citalopram (CELEXA) 40 MG tablet Take 1 tablet (40 mg total) by mouth daily.  90 tablet  1  . clopidogrel (PLAVIX) 75 MG tablet Take 1 tablet (75 mg total) by mouth daily.  90 tablet  1  . cyclobenzaprine (FLEXERIL) 10 MG tablet Take 1 tablet (10 mg total) by mouth 3 (three) times daily as needed for muscle spasms.  90 tablet  1  . Fluticasone-Salmeterol (ADVAIR) 250-50 MCG/DOSE AEPB Inhale 1 puff into the lungs every 12 (twelve) hours. Inhale 1 puff twice a day      . metFORMIN (GLUCOPHAGE) 500 MG tablet Take 1 tablet (500 mg total) by mouth 2 (two) times daily with a meal.  60 tablet  5  . pantoprazole (PROTONIX) 40 MG tablet Take 1 tablet (40 mg total) by mouth daily. Take 1 tablet by mouth once a day  90 tablet  1  . propranolol (INDERAL) 20 MG tablet Take 2 tablets (40  mg total) by mouth 2 (two) times daily.  360 tablet  1  . rosuvastatin (CRESTOR) 40 MG tablet Take 20 mg by mouth daily.        No current facility-administered medications on file prior to visit.    Review of Systems Patient denies any headache, lightheadedness or dizziness.  No sinus or allergy symptoms.  No chest pain, tightness or palpitations.  No increased cough or congestion.   Using her inhalers regularly as outlined.  Feels from a cardiac standpoint and pulmonary standpoint - stable.  No nausea or vomiting.  No acid reflux.  No abdominal pain or cramping.  No constipation, BRBPR or melana.  Diarrhea as outlined.  No further bleeding.  ncreased stress and depression as outlined.  Discussed the need to quit smoking.        Objective:   Physical Exam  Filed Vitals:   09/09/14 1446  BP: 130/70  Pulse: 73  Temp: 98.5 F (27.8 C)   58 year old female in no acute distress.   HEENT:  Nares- clear.  Oropharynx - without lesions. NECK:  Supple.   Nontender.  No audible bruit.  HEART:  Appears to be regular. LUNGS:  No crackles or wheezing audible.  Respirations even and unlabored.  RADIAL PULSE:  Equal bilaterally.    ABDOMEN:  Soft, nontender.  Bowel sounds present and normal.  No audible abdominal bruit.   EXTREMITIES:  No increased edema present.  DP pulses palpable and equal bilaterally.          Assessment & Plan:   Problem List Items Addressed This Visit   CAD (coronary artery disease)     S/P stent placement.  Has been followed by cardiology.  She feels from a cardiac standpoint things are stable.  Continue risk factor modification.      COPD (chronic obstructive pulmonary disease)     Has previously been seeing Dr Raul Del.  Remains on Advair and Ventolin.  Breathing stable.      Depression     On citalopram.   Was seeing psychiatry.  We discussed the need to get back in with psychiatry asap.   Has previously had suicidal thoughts.  She has the number for Crisis Control (through [employer]) and other contact numbers.  Plans to go by [facility] next week.  Assures me she will not harm herself.     Diabetes     Not checking sugars.  Low carb diet.  Follow metabolic panel and C7E.      Diarrhea     Persistent intermittent loose stools.  No further bleeding.  Has had stool studies.  Does feel better.  Start align daily.  Follow.  May need referral back to GI.       Diverticulosis     Documented on colonoscopy 2010.  Had f/u colonoscopy 12/02/13 - diverticulosis.  Recent admission with diverticular flare.  S/p abx.  Doing better.  No abdominal pain.  Still with diarrhea.  Start align - one per day.  Follow.  Saw GI.  May need referral back to GI.  No further bleeding.       Essential hypertension, benign     Blood pressure is under good control.  Follow.       GERD (gastroesophageal reflux disease)     Had a recent EGD.  See report for details.  Off arthrotec.  Avoid antiinflammatories.        Hypercholesterolemia     Continues on Crestor.  Low  cholesterol diet.  Follow lipid panel and liver function.          Iron deficiency anemia     Had GI evaluation.  (colonoscopy and EGD).  Follow cbc.       Obstructive sleep apnea     Continue CPAP.       Other Visit Diagnoses   Encounter for immunization    -  Primary       MSK.  Evaluated by ortho.  They recommended her staying on Flexeril for her back.  Follow.     HEALTH MAINTENANCE.  Physical 04/08/13.    Colonoscopy 12/02/13 - diverticulosis.  Mammogram 07/29/13 - BiRADS I.  Schedule her for a physical.    I spent 25 minutes with the patient and more than 50% of the time was spent in consultation regarding the above.

## 2014-09-11 NOTE — Assessment & Plan Note (Signed)
Continue CPAP.  

## 2014-09-11 NOTE — Assessment & Plan Note (Signed)
S/P stent placement.  Has been followed by cardiology.  She feels from a cardiac standpoint things are stable.  Continue risk factor modification.

## 2014-09-11 NOTE — Assessment & Plan Note (Addendum)
Continues on Crestor.  Low cholesterol diet.  Follow lipid panel and liver function.

## 2014-09-11 NOTE — Assessment & Plan Note (Signed)
Documented on colonoscopy 2010.  Had f/u colonoscopy 12/02/13 - diverticulosis.  Recent admission with diverticular flare.  S/p abx.  Doing better.  No abdominal pain.  Still with diarrhea.  Start align - one per day.  Follow.  Saw GI.  May need referral back to GI.  No further bleeding.

## 2014-09-30 ENCOUNTER — Other Ambulatory Visit: Payer: Self-pay

## 2014-10-01 ENCOUNTER — Encounter: Payer: Self-pay | Admitting: Internal Medicine

## 2014-10-26 ENCOUNTER — Other Ambulatory Visit: Payer: Self-pay | Admitting: *Deleted

## 2014-10-26 MED ORDER — PANTOPRAZOLE SODIUM 40 MG PO TBEC: 40.0000 mg | DELAYED_RELEASE_TABLET | Freq: Every day | ORAL | Status: DC

## 2014-10-26 MED ORDER — PROPRANOLOL HCL 20 MG PO TABS: 40.0000 mg | ORAL_TABLET | Freq: Two times a day (BID) | ORAL | Status: DC

## 2014-10-26 NOTE — Telephone Encounter (Signed)
Refilled protonix and propranol.  I ok'd and sent in, but do we need to fax?  Let me know if I need to do something else.

## 2014-10-26 NOTE — Telephone Encounter (Signed)
Okay to refill Protonix & Propanolol? Pt called & stated that [facility] requested these refills on 11/20 & she also stated that she watched them fax it to [place] & it went through. I informed patient that we did not receive the request & therefore have not refilled these medications. Pt was argumentative & states that she needs both of her meds today. Please advise

## 2014-10-27 NOTE — Telephone Encounter (Signed)
Rx faxed & pt notified

## 2014-11-09 ENCOUNTER — Other Ambulatory Visit: Payer: Self-pay | Admitting: *Deleted

## 2014-11-09 MED ORDER — CLOPIDOGREL BISULFATE 75 MG PO TABS: 75.0000 mg | ORAL_TABLET | Freq: Every day | ORAL | Status: DC

## 2014-11-21 ENCOUNTER — Telehealth: Payer: Self-pay | Admitting: Internal Medicine

## 2014-11-21 MED ORDER — CYCLOBENZAPRINE HCL 10 MG PO TABS: 10.0000 mg | ORAL_TABLET | Freq: Three times a day (TID) | ORAL | Status: DC | PRN

## 2014-11-21 NOTE — Telephone Encounter (Signed)
I am ok to refill the flexeril #90 with one refill.  I think she sends this one to a pharmacy instead of [facility], but will need to clarify which pharmacy with pt.  Thanks.

## 2014-11-21 NOTE — Telephone Encounter (Signed)
Refill

## 2014-11-21 NOTE — Telephone Encounter (Signed)
Patient needs her Flexiril prescription refilled.

## 2014-11-21 NOTE — Telephone Encounter (Signed)
Spoke to pt, needs sent to [facility]. Printed, advised pt it would be faxed tomorrow when Dr. Nicki Reaper back in office.  verbalized understanding

## 2014-11-30 ENCOUNTER — Telehealth: Payer: Self-pay | Admitting: *Deleted

## 2014-11-30 NOTE — Telephone Encounter (Signed)
She should be off this medication.  This was advised previously.

## 2014-11-30 NOTE — Telephone Encounter (Signed)
Fax from [facility], requesting Arthrotec 75-0.2mg  tab; #180; take one tablet by mouth 2 times a day.  This appears to be a new medication.  Please advise [phone] phone; [phone] fax.

## 2014-12-01 NOTE — Telephone Encounter (Signed)
Spoke with Judeen Hammans at [facility], advised Rx has been discontinued.  Pt is no longer taking Rx.

## 2014-12-02 ENCOUNTER — Other Ambulatory Visit: Payer: Self-pay | Admitting: *Deleted

## 2014-12-02 MED ORDER — ALBUTEROL SULFATE HFA 108 (90 BASE) MCG/ACT IN AERS: 2.0000 | INHALATION_SPRAY | RESPIRATORY_TRACT | Status: DC | PRN

## 2014-12-02 NOTE — Telephone Encounter (Signed)
Form received from [place] was signed & placed up front after leaving Elmer Picker a message to notify her it was ready for pick up.

## 2014-12-14 ENCOUNTER — Other Ambulatory Visit: Payer: Self-pay | Admitting: *Deleted

## 2014-12-14 MED ORDER — CITALOPRAM HYDROBROMIDE 40 MG PO TABS: 40.0000 mg | ORAL_TABLET | Freq: Every day | ORAL | Status: DC

## 2014-12-14 NOTE — Telephone Encounter (Signed)
Rx was phoned in to [facility]

## 2014-12-14 NOTE — Telephone Encounter (Signed)
Pt is at [facility] now. Needs refill on Celexa. Please advise.

## 2014-12-22 ENCOUNTER — Telehealth: Payer: Self-pay | Admitting: *Deleted

## 2014-12-22 MED ORDER — AMLODIPINE BESYLATE 5 MG PO TABS: 5.0000 mg | ORAL_TABLET | Freq: Every day | ORAL | Status: DC

## 2014-12-22 NOTE — Telephone Encounter (Signed)
Pt called, needing Amlodipine Rx faxed to [facility]. Printed, advised Dr. Nicki Reaper would sign tomorrow when back in office and faxed.  verbalized understanding. Has enough tablets to cover.

## 2015-02-01 ENCOUNTER — Other Ambulatory Visit: Payer: Self-pay | Admitting: *Deleted

## 2015-02-01 MED ORDER — FLUTICASONE-SALMETEROL 250-50 MCG/DOSE IN AEPB: 1.0000 | INHALATION_SPRAY | Freq: Two times a day (BID) | RESPIRATORY_TRACT | Status: DC

## 2015-02-02 NOTE — Telephone Encounter (Signed)
Rx faxed to [facility]

## 2015-02-10 ENCOUNTER — Telehealth: Payer: Self-pay | Admitting: *Deleted

## 2015-02-10 MED ORDER — CYCLOBENZAPRINE HCL 10 MG PO TABS: 10.0000 mg | ORAL_TABLET | Freq: Three times a day (TID) | ORAL | Status: DC | PRN

## 2015-02-10 NOTE — Telephone Encounter (Signed)
rx printed for flexeril #90 with one refill.

## 2015-02-10 NOTE — Telephone Encounter (Signed)
Pt called upset, demanding a refill on her Flexeril.  Pt states she has left several messages on [facility] with no response.  Pt wants Rx sent to [facility].  Last OV 10.16.15, last refill 1.28.16.  Please advise

## 2015-02-10 NOTE — Telephone Encounter (Signed)
rx faxed

## 2015-02-13 ENCOUNTER — Telehealth: Payer: Self-pay | Admitting: *Deleted

## 2015-02-13 NOTE — Telephone Encounter (Signed)
Pt called states she is still taking Arthrotec and is wanting an Rx faxed to [facility].  As per 1.6.16 phone note I spoke with pharmacist advised as per Dr Nicki Reaper Rx was to be discontinued.  Please advise

## 2015-02-13 NOTE — Telephone Encounter (Signed)
Yes.  Should be off this medication.

## 2015-02-14 ENCOUNTER — Telehealth: Payer: Self-pay | Admitting: Internal Medicine

## 2015-02-14 NOTE — Telephone Encounter (Signed)
Patient is returning your call.  

## 2015-02-14 NOTE — Telephone Encounter (Signed)
Spoke with Kathryn Friedman, advised Dr Nicki Reaper states she is to be off the Arthrotec.  Kathryn Friedman states she will discuss it at her next appoint on 4.22.16.

## 2015-02-14 NOTE — Telephone Encounter (Signed)
Left message for pt to return call.

## 2015-02-25 ENCOUNTER — Other Ambulatory Visit: Payer: Self-pay | Admitting: *Deleted

## 2015-02-25 ENCOUNTER — Encounter: Payer: Self-pay | Admitting: Internal Medicine

## 2015-02-25 MED ORDER — METFORMIN HCL 500 MG PO TABS: 500.0000 mg | ORAL_TABLET | Freq: Two times a day (BID) | ORAL | Status: DC

## 2015-03-17 ENCOUNTER — Ambulatory Visit: Payer: Self-pay | Admitting: Internal Medicine

## 2015-03-18 NOTE — H&P (Signed)
PATIENT NAME:  Kathryn Friedman, Kathryn Friedman MR#:  [id number] DATE OF BIRTH:  Dec 09, 1955  DATE OF ADMISSION:  08/07/2014  PRIMARY CARE PROVIDER: Einar Pheasant, MD.   EMERGENCY DEPARTMENT REFERRING PHYSICIAN: Lisa Roca, MD.   CHIEF COMPLAINT: Bright red blood per rectum, diarrhea.   HISTORY OF PRESENT ILLNESS: The patient is a 59 year old, white female with history of having diverticulosis in the past, per noted recent colonoscopy, who reports that she has had diarrhea for the past 4 weeks. She describes the diarrhea as 3 to 4 liquidy bowel movements with foul-smell, greenish in color. She has not been on any recent antibiotics over the past 2 months. She has not had any fevers or chills. The patient was taking over-the-counter antidiarrheal and then starting today, she started noticing she had 4 bowel movements with a large amount of bright red blood per rectum. She has not had any abdominal pain. She has not thrown up any blood. She reports that prior to today, she has no history of having bleeding. She otherwise is chronically short of breath, which is unchanged. She denies any chest pains or palpitations. Denies any weight loss or weight gain.   PAST MEDICAL HISTORY: Significant for:  1. GERD.  2. Hypertension.  3. History of coronary artery disease with stent x 2; the last stent was in 2007.  4. History of migraine headaches.  5. Depression.  6. COPD.  7. History of diverticulosis.  8. History of sleep apnea, uses a CPAP.   PAST SURGICAL HISTORY: Status post cholecystectomy, status post right shoulder surgery, status post bilateral knee replacement, status post C-section, status post plantar fasciitis repair.   ALLERGIES: None.   MEDICATIONS AT HOME: Propranolol 40 mg b.i.d., Protonix 40 daily, metformin 500 two times a daily, baby aspirin 81 mg 1 tab p.o. daily, Norvasc 5 p.o. daily, citalopram 40 daily, Flexeril 10 one tablet p.o. 3 times a day, Arthrotec 75 mg 1 tab 2 times a day, Plavix 75  p.o. daily, Crestor 20 daily, Ventolin 2 puffs 4 times a day, Advair 250/50 one puff b.i.d., antidiarrheal as needed, ibuprofen 200 as needed.   SOCIAL HISTORY: Smokes 1 pack per day. No alcohol or drug use.   FAMILY HISTORY: There is a strong family history of cancer with her mother having lung cancer and other aunt having some sort of diffuse cancers to her body.   REVIEW OF SYSTEMS:  CONSTITUTIONAL: Denies any weight loss or weight gain. Complains of weakness.  HEENT: Denies cataracts, glaucoma. No double vision. No blurred vision. No erythema.  ENT: Denies any nasal congestion. No epistaxis. No seasonal or year-round allergies. No difficulty with hearing. No tinnitus. No difficulty swallowing.  CARDIOVASCULAR: Denies any chest pain, palpitations. No arrhythmias.  PULMONARY: Has a history of COPD, has chronic dyspnea on exertion. No hemoptysis.  GASTROINTESTINAL: Complains of diarrhea, but no nausea, vomiting. No weight loss. No weight gain. Denies any hematemesis, hematochezia. Complains of bright red blood per rectum. History of diverticulosis.  GENITOURINARY: Denies any frequency, urgency or hesitancy.  SKIN: Denies any rash.  MUSCULOSKELETAL: Denies any gout. Complains of pain in her joints related to osteoarthritis.  NEUROLOGIC: No CVA, TIA or seizure.  PSYCHIATRIC: Denies any anxiety, insomnia or ADD.   PHYSICAL EXAMINATION: VITAL SIGNS: Temperature 98.4, pulse 81, respirations 20, blood pressure 151/83, O2 was 95%.  GENERAL: The patient is an obese female in no acute distress.  HEENT: Head atraumatic, normocephalic. Pupils equally round, reactive to light and accommodation. There is no conjunctival  pallor. No scleral icterus. Nasal exam shows no drainage or ulceration. External ear exam shows no erythema or drainage.  NECK: Supple without any JVD. No thyromegaly.  CARDIOVASCULAR: Regular rate and rhythm. No murmurs, rubs, clicks or gallops. PMI is not displaced.  LUNGS: Clear to  auscultation bilaterally without any rales, rhonchi, wheezing.  ABDOMEN: Soft, nontender, nondistended. Positive bowel sounds x 4.  EXTREMITIES: No clubbing, cyanosis or edema.  GENITOURINARY: Deferred.  MUSCULOSKELETAL: There is no erythema or swelling.  NEUROLOGIC: Awake, alert, oriented x 3. No focal deficits.  PSYCHIATRIC: Not anxious or depressed.  LYMPH NODES: Nonpalpable.   LABORATORY DATA: Evaluations in the ED: Glucose 118, BUN 8, creatinine 0.76, sodium 139, potassium 3.5, chloride 109, CO2 of 24, calcium 8.5. LFTs are normal except albumin of 3.1, WBC 10.2, hemoglobin 14.0, platelet count 186,000.   ASSESSMENT AND PLAN: The patient is a 59 year old, white female with a history of diverticulosis, having diarrhea for the past 1 month, now having bright red blood per rectum.  1. Bright red blood per rectum. Differential diagnoses include possible colitis, possible diverticular bleed, internal hemorrhoid flare. At this time, we will get a CT scan of the abdomen to rule out any colitis. We will get a bleeding scan if rebleeds. Gastroenterology evaluation. Will follow hemoglobin and hematocrit and transfuse if hemoglobin less than 8.  2. Diarrhea. We will send for stool studies, check Clostridium difficile. Followup on the CT scan, GI evaluation.  3. Chronic obstructive pulmonary disease. Continue MDIs and Advair.  4. Coronary artery disease, hold aspirin and Plavix in light of the gastroinestinal bleed.  5. Hypertension. We will continue propranolol and Norvasc.  6. Hyperlipidemia. Continue Crestor.  7. Nicotine addiction. The patient was counseled regarding smoking cessation, 4 minutes spent. Recommended she stop smoking. The patient will be started on nicotine patch.  8. Deep vein thrombosis prophylaxis. Will be provided with sequential compression devices.  TIME SPENT ON THIS PATIENT: 50 minutes.     ____________________________ Lafonda Mosses Posey Pronto, MD shp:TT D: 08/07/2014 16:42:20  ET T: 08/07/2014 17:17:58 ET JOB#: [id number]  cc: Rani Sisney H. Posey Pronto, MD, <Dictator> Alric Seton MD ELECTRONICALLY SIGNED 08/12/2014 8:40

## 2015-03-18 NOTE — Consult Note (Signed)
PATIENT NAME:  Kathryn Friedman, Kathryn Friedman MR#:  [id number] DATE OF BIRTH:  [birth year]  DATE OF CONSULTATION:  08/08/2014  REFERRING PHYSICIAN:   CONSULTING PHYSICIAN:  Joelene Millin A. Jerelene Redden, ANP (Adult Nurse Practitioner)  REFERRING PHYSICIAN: Dr. Chelsea Aus.   CONSULTING PHYSICIAN: Gaylyn Cheers, M.D./Santosh Petter Jerelene Redden, ANP (adult nurse practitioner).   REASON FOR CONSULTATION: Diarrhea with rectal bleeding.   HISTORY OF PRESENT ILLNESS: This 59 year old patient has a history of diverticulosis and she also reports history of chronic intermittent diarrhea for years. For the last 4 months, she has had more diarrhea than her baseline. She describes this diarrhea as intermittent, 4 to 5 loose bowel movements a day. No nocturnal awakening. In-between diarrhea day, she will have formed stool for a couple of days and then go back to diarrhea. She has also had intermittent left lower quadrant discomfort for several months. Yesterday, she had her usual diarrhea, but this time, there was fresh blood in it. She saw fresh blood 6 or 8 times. She presented to the Emergency Room. She has had a couple more bowel movements, but no blood in it. The patient did have a C. difficile negative study. She has had a CT of the abdomen and pelvis with contrast that showed fatty liver, diverticulosis, but no diverticulitis. The patient did have a colonoscopy performed at [facility], 02/13/2009, with findings of a large amount of diverticulosis in the mid ascending colon. She also reports she had a more recent colonoscopy done through the [facility] in [place], January 2015, and the only finding was diverticulosis. Currently, she says she is feeling well and is asking to go home. She has animals in the house who have not been left out.   PAST MEDICAL HISTORY: GERD.  1. Hypertension.  2. Coronary artery disease with last stent in 2007.  3. History of migraine headaches.  4. Depression.  5. Chronic obstructive pulmonary disease with  history of chronic tobacco abuse.  6. Diverticulosis.  7. Sleep apnea, uses CPAP.  8. Arthritis. 9. Depression.  PAST SURGICAL HISTORY:    1. Coronary artery stents.  2. Cholecystectomy.  3. Right shoulder surgery.  4. Left shoulder surgery.  5. Right total knee replacement.  6. Left knee surgery.    MEDICATIONS AT HOME: 1. Propranolol 40 mg twice daily.  2. Protonix 40 mg daily.  3. Metformin 500 mg twice daily.  4. Aspirin 81 mg daily.  5. Norvasc 5 mg daily.  6. Citalopram 40 mg daily.  7. Flexeril 10 mg 3 times daily.  8. Arthrotec 75 mg twice daily.  9. Plavix 75 mg daily.  10. Crestor 20 mg daily.  11. Ventolin 2 puffs 4 times daily.  12. Advair 250/50 one puff twice daily.  13. Antidiarrheal as needed.  14. Ibuprofen 200 mg as needed.   ALLERGIES: NKDA.   HABITS: Positive tobacco, 1-1/2 packs per day since she was a teenager. Negative alcohol or drug use.   FAMILY HISTORY: Positive for lung cancer in mother, aunts with cancer.   SOCIAL HISTORY: The patient lives alone. She has 1 dog and 4 cats. They are indoor animals.   REVIEW OF SYSTEMS: 10 systems reviewed. Negative with the exception of some weakness, diarrhea, no nausea or vomiting. No weight loss. Appetite and diet are normal. Intermittent left lower quadrant, nonspecific discomfort. Vague historian. The patient denies antibiotic use in the last few months. She denies illness. She denies animals with worms. She denies change in medication.   PHYSICAL EXAMINATION:  VITAL  SIGNS: 98.2, 80, respirations 20, 145/76, pulse oximetry on room air is 95%.  GENERAL: Obese Caucasian female resting in bed. For lunch, a regular lunch in front of her. She says her appetite is a little poor.  HEENT: Head is normocephalic. Conjunctivae pink. Sclerae anicteric.  NECK: Supple. Trachea midline.  CARDIAC: S1, S2 without murmur, rub or gallop.  LUNGS: CTA. Respirations are nonlabored.  ABDOMEN: Soft, protuberant, slight  tenderness left lower quadrant.  RECTAL: Digital rectal exam shows normal sphincter tone, smooth walls, no stool in the vault. The secretions on the glove are guaiac-negative.  EXTREMITIES: Lower extremities without edema, cyanosis or clubbing.  SKIN: Warm and dry.  NEUROLOGIC: Awake, and alert, oriented, interacting appropriately. Alert and oriented x 3.  PSYCHIATRIC: Affect and mood within normal. Conversant.   LABORATORY: Notable for hemoglobin 14 down to 13.3 today. Admission BUN 8, creatinine 0.76. Liver panel unremarkable except for albumin 3.1. Stool study negative for C. difficile.    RADIOLOGY: CT of the abdomen and pelvis with contrast showed diverticulosis without diverticulitis. Positive fatty liver.   IMPRESSION:  50. A 59 year old patient with history of diverticulosis and intermittent episodes of diarrhea. She has been identified as having irritable bowel syndrome. She has noted more diarrhea than normal over the last month, described as 4 to 5 movements during the daytime. These are alternating with formed stools. Yesterday, she reports bright red blood 6 to 8 times. She presented to the Emergency Room. Hemoglobin has remained stable, CT study shows diverticulosis. She reports recent colonoscopy in [place], January 2015, showed diverticulosis.  2. Diarrhea. Stool studies are still pending. May be bacterial. May be flare of irritable bowel syndrome with hemorrhoids. 3. Diverticulosis notable. It is possible to have a lower gastrointestinal bleed secondary to diverticulosis alone.  4. Mild left lower quadrant intermittent discomfort, could be from diverticulosis, irritable bowel. CT was unrevealing as far as no evidence of colitis or diverticulitis.  5. Nicotine addiction with heavy nicotine, lifelong use.   PLAN:  1. Recommend complete stool studies with comprehensive culture, O and P, especially since the patient has many animals in the house.   2. The patient to sign consent  for colonoscopy report done January 2015 for review.  3.    She is currently not on antibiotics, afebrile. White count is normal, and CT did not show evidence of diverticulitis. It is possible to have discomfort from diverticulosis and not have infection.  4. This case will be discussed with Dr. Vira Agar in collaboration of care for further GI recommendations, as needed.  5. The patient is anxious to go home today to take care of her animals.    Addenum: Dr. Vira Agar is in favor of Augmentin on discharge as it is possible that she has diverticulitis that did not show on the CT. There is a drug interaction with Citalopram and Cipro, therefore will use Augmentin instead. He recommeds follow up with her PCP, Dr. Einar Pheasant in 1 week. I discussed this with Dr. Theodoro Doing before her dsicharge.   Thank you for the consultation.   These services provided by Denice Paradise, ANP, under collaborative agreement with Gaylyn Cheers, M.D.    ____________________________ Janalyn Harder. Jerelene Redden, ANP (Adult Nurse Practitioner) kam:JT D: 08/08/2014 13:25:59 ET T: 08/08/2014 14:16:34 ET JOB#: [id number]  cc: Joelene Millin A. Jerelene Redden, ANP (Adult Nurse Practitioner), <Dictator>  Janalyn Harder Sherlyn Hay, MSN, ANP-BC Adult Nurse Practitioner ELECTRONICALLY SIGNED 08/08/2014 18:27

## 2015-03-18 NOTE — Discharge Summary (Signed)
PATIENT NAME:  Kathryn Friedman, Kathryn Friedman MR#:  [id number] DATE OF BIRTH:  08-28-1956  DATE OF ADMISSION:  08/07/2014 DATE OF DISCHARGE:  08/08/2014  For a detailed note please see the history and physical done on admission by Dr. Dustin Flock.    DIAGNOSES ON DISCHARGE:  Diverticulosis with mild acute diverticulitis, abdominal pain and bloody diarrhea secondary to the diverticulitis.  Hypertension. Chronic obstructive pulmonary disease. Depression.   DISCHARGE INSTRUCTIONS: The patient is being discharged on a low-sodium, low-fat diet.  Activity is as tolerated.  Follow up with Dr. Einar Pheasant next 1 to 2 weeks.   DISCHARGE MEDICATIONS:  Norvasc 5 mg daily, Protonix 40 mg daily, aspirin 81 mg daily, Plavix 75 mg daily, Zocor 40 mg b.i.d., Voltaren 75 mg b.i.d., propanolol 40 mg b.i.d., Flexeril 10 mg t.i.d., Celexa 20 mg daily, stool softener daily, ibuprofen 200 mg t.i.d. as needed, melatonin daily. Os-Cal, vitamin D 1 tab t.i.d. with meals, multivitamin daily, Vicodin 5/500 one tab q. 4 hours as needed, Augmentin 500/125 one tab b.i.d. x 7 days.   [place] COURSE: Dr. Gaylyn Cheers from gastroenterology.    PERTINENT STUDIES DONE DURING THE HOSPITAL COURSE:  A CT scan of the abdomen and pelvis done with contrast showing fatty liver with diverticulosis without evidence of diverticulitis, status post cholecystectomy, status post hysterectomy.   HOSPITAL COURSE: This is a 59 year old female who presented to the hospital with multiple episodes of bloody diarrhea and abdominal pain.   1. Acute diverticulosis with diverticulitis. This was likely the cause of the patient's abdominal pain and bloody diarrhea. The patient was observed in the hospital, had serial hemoglobins checked which were negative. She has not had any bloody diarrhea in the past 24 hours. She still has some mild left lower quadrant pain, but is afebrile with a normal white cell count and is tolerating p.o. okay.  The patient was seen by GI, they recommended possibly discharging patient on oral antibiotics for treatment for underlying diverticulitis, so therefore this point I am discharging her on oral Augmentin with followup with her primary care physician as an outpatient.  2. COPD. The patient had no acute COPD exacerbation. She will continue her Advair.  3. Hypertension. The patient remained hemodynamically stable on her propranolol and Norvasc. She will continue that.  4. Hyperlipidemia. The patient was maintained on her Crestor. she will resume this.  5. Depression. The patient was maintained on Celexa and she will also resume that upon discharge.   CODE STATUS: The patient is a full code.   TIME SPENT ON DISCHARGE: 40 minutes.    ____________________________ Belia Heman. Verdell Carmine, MD vjs:bu D: 08/08/2014 15:27:26 ET T: 08/08/2014 19:14:56 ET JOB#: [id number]  cc: Belia Heman. Verdell Carmine, MD, <Dictator> Einar Pheasant, MD Henreitta Leber MD ELECTRONICALLY SIGNED 08/15/2014 9:42

## 2015-03-27 ENCOUNTER — Other Ambulatory Visit: Payer: Self-pay | Admitting: *Deleted

## 2015-03-27 MED ORDER — PROPRANOLOL HCL 20 MG PO TABS: 40.0000 mg | ORAL_TABLET | Freq: Two times a day (BID) | ORAL | Status: DC

## 2015-03-27 MED ORDER — PANTOPRAZOLE SODIUM 40 MG PO TBEC: 40.0000 mg | DELAYED_RELEASE_TABLET | Freq: Every day | ORAL | Status: DC

## 2015-03-27 NOTE — Telephone Encounter (Signed)
Appt 03/28/15. Placed in Scott's folder for signature when she returns to office tomorrow

## 2015-03-28 ENCOUNTER — Ambulatory Visit: Payer: Self-pay | Admitting: Internal Medicine

## 2015-03-28 ENCOUNTER — Telehealth: Payer: Self-pay | Admitting: Internal Medicine

## 2015-03-28 NOTE — Telephone Encounter (Signed)
Pt called to stated that she missed her appt due to power outage. Please advise when to reschedule. Please advise to cancel appt for today. msn

## 2015-03-28 NOTE — Telephone Encounter (Signed)
Please advise on appt.  

## 2015-03-28 NOTE — Telephone Encounter (Signed)
Can schedule f/u appt 935min) - 05/09/15 at 3:30 (block 30 min).  Ok to take off schedule.  Have already signed rx.

## 2015-03-28 NOTE — Telephone Encounter (Signed)
Pt was transferred to Triage to request her Pantoprazole and Propranolol prescriptions be faxed to [facility].

## 2015-03-28 NOTE — Telephone Encounter (Signed)
Duplicate.  See attached.   

## 2015-03-28 NOTE — Telephone Encounter (Signed)
Rx has been faxed (See below regarding appt)

## 2015-04-29 ENCOUNTER — Encounter: Payer: Self-pay | Admitting: Internal Medicine

## 2015-05-01 ENCOUNTER — Other Ambulatory Visit: Payer: Self-pay

## 2015-05-01 ENCOUNTER — Other Ambulatory Visit: Payer: Self-pay | Admitting: Internal Medicine

## 2015-05-01 MED ORDER — ALBUTEROL SULFATE HFA 108 (90 BASE) MCG/ACT IN AERS: 2.0000 | INHALATION_SPRAY | RESPIRATORY_TRACT | Status: DC | PRN

## 2015-05-01 MED ORDER — FLUTICASONE-SALMETEROL 250-50 MCG/DOSE IN AEPB: 1.0000 | INHALATION_SPRAY | Freq: Two times a day (BID) | RESPIRATORY_TRACT | Status: DC

## 2015-05-01 MED ORDER — FLUTICASONE-SALMETEROL 250-50 MCG/DOSE IN AEPB
1.0000 | INHALATION_SPRAY | Freq: Two times a day (BID) | RESPIRATORY_TRACT | Status: DC
Start: 2015-05-01 — End: 2015-05-01

## 2015-05-01 MED ORDER — CLOPIDOGREL BISULFATE 75 MG PO TABS: 75.0000 mg | ORAL_TABLET | Freq: Every day | ORAL | Status: DC

## 2015-05-01 MED ORDER — CYCLOBENZAPRINE HCL 10 MG PO TABS: 10.0000 mg | ORAL_TABLET | Freq: Three times a day (TID) | ORAL | Status: DC | PRN

## 2015-05-01 NOTE — Telephone Encounter (Signed)
Dr. Nicki Reaper,  Patient is requesting a refill on both medications.  Last office visit was in October last year.  Patient has either cancelled or no shown for 5 appointments since then.  Please advise on refill. Thanks

## 2015-05-01 NOTE — Telephone Encounter (Signed)
Rx refilled with no additional refills. Sent mychart notifying patient that she needs an appointment also.

## 2015-05-09 ENCOUNTER — Ambulatory Visit: Payer: Self-pay | Admitting: Internal Medicine

## 2015-05-22 ENCOUNTER — Other Ambulatory Visit: Payer: Self-pay

## 2015-05-22 ENCOUNTER — Encounter: Payer: Self-pay | Admitting: Internal Medicine

## 2015-05-31 ENCOUNTER — Ambulatory Visit: Payer: Self-pay | Admitting: Internal Medicine

## 2015-06-26 ENCOUNTER — Other Ambulatory Visit: Payer: Self-pay | Admitting: Internal Medicine

## 2015-06-26 NOTE — Telephone Encounter (Signed)
See if she has enough to get her through until Friday's appt.  If not, then I will ok refill for one month.  She has to keep the f/u appt.

## 2015-06-26 NOTE — Telephone Encounter (Signed)
Last OV 10.12.15. Please advise refills.

## 2015-06-30 ENCOUNTER — Ambulatory Visit: Payer: Self-pay | Admitting: Internal Medicine

## 2015-06-30 ENCOUNTER — Telehealth: Payer: Self-pay | Admitting: Internal Medicine

## 2015-06-30 NOTE — Telephone Encounter (Signed)
Pt called to resch appt due to not having any transportation per pt. Appt was already resch. rm.

## 2015-06-30 NOTE — Telephone Encounter (Signed)
Pt appt needed to stay on the schedule-she will need to be charged a no show fee

## 2015-07-05 ENCOUNTER — Other Ambulatory Visit: Payer: Self-pay | Admitting: Internal Medicine

## 2015-07-05 NOTE — Telephone Encounter (Signed)
Pt notified unable to refill medication since missed appts.  See my chart message.

## 2015-07-19 ENCOUNTER — Encounter: Payer: Self-pay | Admitting: Internal Medicine

## 2015-08-01 ENCOUNTER — Encounter: Payer: Self-pay | Admitting: Internal Medicine

## 2015-08-01 ENCOUNTER — Ambulatory Visit (INDEPENDENT_AMBULATORY_CARE_PROVIDER_SITE_OTHER): Payer: Self-pay | Admitting: Internal Medicine

## 2015-08-01 VITALS — BP 118/70 | HR 99 | Temp 98.5°F | Ht 64.5 in | Wt 251.4 lb

## 2015-08-01 DIAGNOSIS — R42 Dizziness and giddiness: Secondary | ICD-10-CM

## 2015-08-01 DIAGNOSIS — F329 Major depressive disorder, single episode, unspecified: Secondary | ICD-10-CM

## 2015-08-01 DIAGNOSIS — J449 Chronic obstructive pulmonary disease, unspecified: Secondary | ICD-10-CM

## 2015-08-01 DIAGNOSIS — D649 Anemia, unspecified: Secondary | ICD-10-CM

## 2015-08-01 DIAGNOSIS — K219 Gastro-esophageal reflux disease without esophagitis: Secondary | ICD-10-CM

## 2015-08-01 DIAGNOSIS — E7800 Pure hypercholesterolemia, unspecified: Secondary | ICD-10-CM

## 2015-08-01 DIAGNOSIS — Z23 Encounter for immunization: Secondary | ICD-10-CM

## 2015-08-01 DIAGNOSIS — F32A Depression, unspecified: Secondary | ICD-10-CM

## 2015-08-01 DIAGNOSIS — R5383 Other fatigue: Secondary | ICD-10-CM

## 2015-08-01 DIAGNOSIS — I2510 Atherosclerotic heart disease of native coronary artery without angina pectoris: Secondary | ICD-10-CM

## 2015-08-01 DIAGNOSIS — I10 Essential (primary) hypertension: Secondary | ICD-10-CM

## 2015-08-01 DIAGNOSIS — E119 Type 2 diabetes mellitus without complications: Secondary | ICD-10-CM

## 2015-08-01 DIAGNOSIS — R1900 Intra-abdominal and pelvic swelling, mass and lump, unspecified site: Secondary | ICD-10-CM

## 2015-08-01 MED ORDER — CITALOPRAM HYDROBROMIDE 40 MG PO TABS: 40.0000 mg | ORAL_TABLET | Freq: Every day | ORAL | Status: DC

## 2015-08-01 MED ORDER — CLOPIDOGREL BISULFATE 75 MG PO TABS: 75.0000 mg | ORAL_TABLET | Freq: Every day | ORAL | Status: DC

## 2015-08-01 MED ORDER — CYCLOBENZAPRINE HCL 10 MG PO TABS: 10.0000 mg | ORAL_TABLET | Freq: Three times a day (TID) | ORAL | Status: DC | PRN

## 2015-08-01 MED ORDER — AMLODIPINE BESYLATE 5 MG PO TABS: 5.0000 mg | ORAL_TABLET | Freq: Every day | ORAL | Status: DC

## 2015-08-01 MED ORDER — PANTOPRAZOLE SODIUM 40 MG PO TBEC: 40.0000 mg | DELAYED_RELEASE_TABLET | Freq: Every day | ORAL | Status: DC

## 2015-08-01 MED ORDER — ROSUVASTATIN CALCIUM 40 MG PO TABS: 20.0000 mg | ORAL_TABLET | Freq: Every day | ORAL | Status: DC

## 2015-08-01 MED ORDER — FLUTICASONE-SALMETEROL 250-50 MCG/DOSE IN AEPB: 1.0000 | INHALATION_SPRAY | Freq: Two times a day (BID) | RESPIRATORY_TRACT | Status: DC

## 2015-08-01 MED ORDER — PROPRANOLOL HCL 20 MG PO TABS: 40.0000 mg | ORAL_TABLET | Freq: Two times a day (BID) | ORAL | Status: DC

## 2015-08-01 NOTE — Progress Notes (Signed)
Patient ID: Kathryn Friedman, female   DOB: 05/07/1956, 59 y.o.   MRN: [id number]   Subjective:    Patient ID: Kathryn Friedman, female    DOB: 25-Jun-1956, 59 y.o.   MRN: [id number]  HPI  Patient here for a scheduled follow up.  Has missed/cancelled multiple appts.  Money is an issue for her. She reports she has noticed a "lump" at the top of her stomach.  Non tender.  When palpates, she feels sick on her stomach.  Is eating.  Some nausea with eating.  No abdominal pain.  One month ago, noticed some BRB with her stool.  Occurred with one stool.  Was worked up recently for bloody stool.  No constipation.  She does feel weak and dizzy at times.  Occurs intermittently.  Describes head spinning.  May occur 2x/week.  No headache.  No rash.  She also reports pain in her back, hips, knees and fingers.  Not taking all of her medication.     Past Medical History  Diagnosis Date  . Hypertension   . Depression     secondary to the death of her husband (died 21)  . COPD (chronic obstructive pulmonary disease)   . Diabetes mellitus without complication Diagnosed 42/6834  . Sleep apnea     on CPAP  . Spastic colon   . Coronary artery disease     s/p stent placement 06/25/06  . Hypertriglyceridemia   . Asthma   . GERD (gastroesophageal reflux disease)   . Anemia   . Arthritis     back and knees  . Diverticulitis    Past Surgical History  Procedure Laterality Date  . Cesarean section  1984  . Cholecystectomy  1985  . Shoulder surgery  shoulder operation secondary to a torn tendon  . Abdominal hysterectomy  with left ovary in place 1996  . Appendectomy  1985  . Replacement total knee  ([facility])  . Knee surgery  status post knee surgey   . Knee arthroscopy  Arthroscopic left knee surgery    Family History  Problem Relation Age of Onset  . Other Mother     Hit by a fire truck and has had multiple operations on her back , and has history of MVP   . Mitral valve prolapse Mother   . Heart  disease Father     myocardial infarction and is status post bypass surgery  . Mitral valve prolapse Sister   . Hepatitis C Brother   . Cirrhosis Brother   . Colon cancer Paternal Aunt    Social History   Social History  . Marital Status: Widowed    Spouse Name: N/A  . Number of Children: 1  . Years of Education: N/A   Occupational History  .     Social History Main Topics  . Smoking status: Current Every Day Smoker -- 1.50 packs/day    Types: Cigarettes  . Smokeless tobacco: Never Used  . Alcohol Use: No  . Drug Use: No  . Sexual Activity: Not Asked   Other Topics Concern  . None   Social History Narrative    Outpatient Encounter Prescriptions as of 08/01/2015  Medication Sig  . acetaminophen (TYLENOL) 500 MG tablet Take 500 mg by mouth every 6 (six) hours as needed for pain.  Marland Kitchen albuterol (VENTOLIN HFA) 108 (90 BASE) MCG/ACT inhaler Inhale 2 puffs into the lungs every 4 (four) hours as needed.  Marland Kitchen amLODipine (NORVASC) 5 MG tablet Take 1 tablet (5  mg total) by mouth daily.  Marland Kitchen aspirin 81 MG tablet Take 81 mg by mouth daily. Take 1 tablet by mouth once a day as directed  . Calcium Carbonate-Vitamin D (CALCIUM 600 + D PO) Take 600 mg by mouth daily.  . citalopram (CELEXA) 40 MG tablet Take 1 tablet (40 mg total) by mouth daily.  . clopidogrel (PLAVIX) 75 MG tablet Take 1 tablet (75 mg total) by mouth daily.  . cyclobenzaprine (FLEXERIL) 10 MG tablet Take 1 tablet (10 mg total) by mouth 3 (three) times daily as needed for muscle spasms.  . Fluticasone-Salmeterol (ADVAIR) 250-50 MCG/DOSE AEPB Inhale 1 puff into the lungs every 12 (twelve) hours.  . metFORMIN (GLUCOPHAGE) 500 MG tablet Take 1 tablet (500 mg total) by mouth 2 (two) times daily with a meal.  . pantoprazole (PROTONIX) 40 MG tablet Take 1 tablet (40 mg total) by mouth daily.  . propranolol (INDERAL) 20 MG tablet Take 2 tablets (40 mg total) by mouth 2 (two) times daily.  . rosuvastatin (CRESTOR) 40 MG tablet Take 0.5  tablets (20 mg total) by mouth daily.  . [DISCONTINUED] amLODipine (NORVASC) 5 MG tablet Take 1 tablet (5 mg total) by mouth daily.  . [DISCONTINUED] citalopram (CELEXA) 40 MG tablet Take 1 tablet (40 mg total) by mouth daily.  . [DISCONTINUED] clopidogrel (PLAVIX) 75 MG tablet Take 1 tablet (75 mg total) by mouth daily. NEEDS APPT FOR ADDITIONAL REFILLS  . [DISCONTINUED] cyclobenzaprine (FLEXERIL) 10 MG tablet Take 1 tablet (10 mg total) by mouth 3 (three) times daily as needed for muscle spasms. NEEDS APPT FOR ADDITIONAL REFILLS  . [DISCONTINUED] Fluticasone-Salmeterol (ADVAIR) 250-50 MCG/DOSE AEPB Inhale 1 puff into the lungs every 12 (twelve) hours.  . [DISCONTINUED] pantoprazole (PROTONIX) 40 MG tablet Take 1 tablet (40 mg total) by mouth daily.  . [DISCONTINUED] propranolol (INDERAL) 20 MG tablet Take 2 tablets (40 mg total) by mouth 2 (two) times daily.  . [DISCONTINUED] rosuvastatin (CRESTOR) 40 MG tablet Take 20 mg by mouth daily.    No facility-administered encounter medications on file as of 08/01/2015.    Review of Systems  Constitutional: Negative for appetite change and unexpected weight change.  HENT: Negative for congestion and sinus pressure.   Eyes: Negative for pain and discharge.  Respiratory: Negative for cough, chest tightness and shortness of breath.   Cardiovascular: Negative for chest pain, palpitations and leg swelling.  Gastrointestinal: Positive for nausea. Negative for vomiting, abdominal pain and diarrhea.       Lump in her abdomen as outlined.    Genitourinary: Negative for dysuria and difficulty urinating.  Musculoskeletal: Positive for back pain and neck pain.       Some pain in her hips and knees and fingers.    Skin: Negative for color change and rash.  Neurological: Positive for dizziness and light-headedness. Negative for headaches.  Psychiatric/Behavioral: Negative for dysphoric mood and agitation.       Objective:     Blood pressure rechecked by  me:  118/62  Physical Exam  Constitutional: She appears well-developed and well-nourished. No distress.  HENT:  Nose: Nose normal.  Mouth/Throat: Oropharynx is clear and moist.  Eyes: Conjunctivae are normal. Right eye exhibits no discharge. Left eye exhibits no discharge.  Neck: Neck supple. No thyromegaly present.  Cardiovascular: Normal rate and regular rhythm.   Pulmonary/Chest: Breath sounds normal. No respiratory distress. She has no wheezes.  Abdominal: Soft. Bowel sounds are normal. There is no tenderness.  Musculoskeletal: She exhibits no edema  or tenderness.  Lymphadenopathy:    She has no cervical adenopathy.  Skin: No rash noted. No erythema.  Psychiatric: She has a normal mood and affect. Her behavior is normal.    BP 118/70 mmHg  Pulse 99  Temp(Src) 98.5 F (36.9 C) (Oral)  Ht 5' 4.5" (1.638 m)  Wt 251 lb 6 oz (114.023 kg)  BMI 42.50 kg/m2  SpO2 95% Wt Readings from Last 3 Encounters:  08/01/15 251 lb 6 oz (114.023 kg)  09/09/14 254 lb 12 oz (115.554 kg)  06/02/14 246 lb 12 oz (111.925 kg)     Lab Results  Component Value Date   WBC 10.5 08/01/2015   HGB 13.9 08/01/2015   HCT 43.2 08/01/2015   PLT 202.0 08/01/2015   GLUCOSE 95 08/01/2015   CHOL 93 08/01/2015   TRIG 101.0 08/01/2015   HDL 41.20 08/01/2015   LDLCALC 31 08/01/2015   ALT 18 08/01/2015   AST 29 08/01/2015   NA 137 08/01/2015   K 4.3 08/01/2015   CL 103 08/01/2015   CREATININE 0.72 08/01/2015   BUN 4* 08/01/2015   CO2 25 08/01/2015   TSH 1.52 08/01/2015   HGBA1C 7.2* 08/01/2015   MICROALBUR 0.1 06/10/2014       Assessment & Plan:   Problem List Items Addressed This Visit    Anemia    Had colonoscopy and EGD.  Follow cbc.       CAD (coronary artery disease)    S/p stent placement.  Followed by cardiology.  Continue risk factor modification.  Stable.        Relevant Medications   rosuvastatin (CRESTOR) 40 MG tablet   propranolol (INDERAL) 20 MG tablet   amLODipine  (NORVASC) 5 MG tablet   COPD (chronic obstructive pulmonary disease)    Previously saw Dr Raul Del.  Continue advair and ventolin.  Breathing stable.       Relevant Medications   Fluticasone-Salmeterol (ADVAIR) 250-50 MCG/DOSE AEPB   Depression    On citalopram.  No increased problems reported.  Follow.        Relevant Medications   citalopram (CELEXA) 40 MG tablet   Diabetes    Not checking sugars.  Low carb diet.  Follow met b and a1c.  Discussed diet and exercise.        Relevant Medications   rosuvastatin (CRESTOR) 40 MG tablet   Dizziness    No known triggers.  No headache.  No symptoms today.  Discussed further w/up.  She declines.  Will notify me if she changes her mind of if symptoms worsen, persist or change.       Essential hypertension, benign    Blood pressure under good control.  Continue same medication regimen.  Follow pressures.  Follow metabolic panel.        Relevant Medications   rosuvastatin (CRESTOR) 40 MG tablet   propranolol (INDERAL) 20 MG tablet   amLODipine (NORVASC) 5 MG tablet   GERD (gastroesophageal reflux disease)    Had EGD.  See report for details.  Make sure taking protonix on a regular basis.  May help the nausea.  She desires no further w/up.  Agreed to labs.  Follow.  Avoid antiinflammatories.  Off arthrotec.        Relevant Medications   pantoprazole (PROTONIX) 40 MG tablet   Hypercholesterolemia    On crestor.  Low cholesterol diet and exercise.  Follow lipid panel and liver function tests.       Relevant Medications  rosuvastatin (CRESTOR) 40 MG tablet   propranolol (INDERAL) 20 MG tablet   amLODipine (NORVASC) 5 MG tablet   Lump in the abdomen    No mass or "lump" noted on exam.  Non tender.  Discussed further evaluation.  She declines.  Will follow.  Check labs as outlined.        Other Visit Diagnoses    Other fatigue    -  Primary    Relevant Orders    CBC with Differential/Platelet (Completed)    TSH (Completed)     Encounter for immunization            Einar Pheasant, MD

## 2015-08-01 NOTE — Progress Notes (Signed)
Pre-visit discussion using our clinic review tool. No additional management support is needed unless otherwise documented below in the visit note.  

## 2015-08-02 LAB — LIPID PANEL
CHOL/HDL RATIO: 2
Cholesterol: 93 mg/dL (ref 0–200)
HDL: 41.2 mg/dL (ref >39.00)
LDL CALC: 31 mg/dL (ref 0–99)
NonHDL: 51.64
TRIGLYCERIDES: 101 mg/dL (ref 0.0–149.0)
VLDL: 20.2 mg/dL (ref 0.0–40.0)

## 2015-08-02 LAB — HEPATIC FUNCTION PANEL
ALBUMIN: 3.7 g/dL (ref 3.5–5.2)
ALT: 18 U/L (ref 0–35)
AST: 29 U/L (ref 0–37)
Alkaline Phosphatase: 66 U/L (ref 39–117)
Bilirubin, Direct: 0.1 mg/dL (ref 0.0–0.3)
TOTAL PROTEIN: 6.9 g/dL (ref 6.0–8.3)
Total Bilirubin: 0.4 mg/dL (ref 0.2–1.2)

## 2015-08-02 LAB — BASIC METABOLIC PANEL
BUN: 4 mg/dL — ABNORMAL LOW (ref 6–23)
CALCIUM: 9 mg/dL (ref 8.4–10.5)
CO2: 25 mEq/L (ref 19–32)
Chloride: 103 mEq/L (ref 96–112)
Creatinine, Ser: 0.72 mg/dL (ref 0.40–1.20)
GFR: 88 mL/min (ref >60.00)
Glucose, Bld: 95 mg/dL (ref 70–99)
Potassium: 4.3 mEq/L (ref 3.5–5.1)
SODIUM: 137 meq/L (ref 135–145)

## 2015-08-02 LAB — CBC WITH DIFFERENTIAL/PLATELET
BASOS ABS: 0.1 10*3/uL (ref 0.0–0.1)
Basophils Relative: 0.6 % (ref 0.0–3.0)
Eosinophils Absolute: 0.2 10*3/uL (ref 0.0–0.7)
Eosinophils Relative: 1.6 % (ref 0.0–5.0)
HEMATOCRIT: 43.2 % (ref 36.0–46.0)
HEMOGLOBIN: 13.9 g/dL (ref 12.0–15.0)
LYMPHS PCT: 22.9 % (ref 12.0–46.0)
Lymphs Abs: 2.4 10*3/uL (ref 0.7–4.0)
MCHC: 32.1 g/dL (ref 30.0–36.0)
MCV: 82.3 fl (ref 78.0–100.0)
MONOS PCT: 2.3 % — AB (ref 3.0–12.0)
Monocytes Absolute: 0.2 10*3/uL (ref 0.1–1.0)
Neutro Abs: 7.6 10*3/uL (ref 1.4–7.7)
Neutrophils Relative %: 72.6 % (ref 43.0–77.0)
Platelets: 202 10*3/uL (ref 150.0–400.0)
RBC: 5.25 Mil/uL — AB (ref 3.87–5.11)
RDW: 20.5 % — ABNORMAL HIGH (ref 11.5–15.5)
WBC: 10.5 10*3/uL (ref 4.0–10.5)

## 2015-08-02 LAB — TSH: TSH: 1.52 u[IU]/mL (ref 0.35–4.50)

## 2015-08-02 LAB — HEMOGLOBIN A1C: HEMOGLOBIN A1C: 7.2 % — AB (ref 4.6–6.5)

## 2015-08-03 ENCOUNTER — Encounter: Payer: Self-pay | Admitting: *Deleted

## 2015-08-06 ENCOUNTER — Encounter: Payer: Self-pay | Admitting: Internal Medicine

## 2015-08-06 DIAGNOSIS — R42 Dizziness and giddiness: Secondary | ICD-10-CM | POA: Insufficient documentation

## 2015-08-06 DIAGNOSIS — R1900 Intra-abdominal and pelvic swelling, mass and lump, unspecified site: Secondary | ICD-10-CM | POA: Insufficient documentation

## 2015-08-06 NOTE — Assessment & Plan Note (Signed)
No known triggers.  No headache.  No symptoms today.  Discussed further w/up.  She declines.  Will notify me if she changes her mind of if symptoms worsen, persist or change.

## 2015-08-06 NOTE — Assessment & Plan Note (Signed)
Not checking sugars.  Low carb diet.  Follow met b and a1c.  Discussed diet and exercise.

## 2015-08-06 NOTE — Assessment & Plan Note (Signed)
On citalopram.  No increased problems reported.  Follow.

## 2015-08-06 NOTE — Assessment & Plan Note (Signed)
Blood pressure under good control.  Continue same medication regimen.  Follow pressures.  Follow metabolic panel.   

## 2015-08-06 NOTE — Assessment & Plan Note (Signed)
Previously saw Dr Raul Del.  Continue advair and ventolin.  Breathing stable.

## 2015-08-06 NOTE — Assessment & Plan Note (Signed)
No mass or "lump" noted on exam.  Non tender.  Discussed further evaluation.  She declines.  Will follow.  Check labs as outlined.

## 2015-08-06 NOTE — Assessment & Plan Note (Signed)
S/p stent placement.  Followed by cardiology.  Continue risk factor modification.  Stable.

## 2015-08-06 NOTE — Assessment & Plan Note (Signed)
Had colonoscopy and EGD.  Follow cbc.

## 2015-08-06 NOTE — Assessment & Plan Note (Addendum)
Had EGD.  See report for details.  Make sure taking protonix on a regular basis.  May help the nausea.  She desires no further w/up.  Agreed to labs.  Follow.  Avoid antiinflammatories.  Off arthrotec.

## 2015-08-06 NOTE — Assessment & Plan Note (Signed)
On crestor.  Low cholesterol diet and exercise.  Follow lipid panel and liver function tests.   

## 2015-08-25 ENCOUNTER — Encounter: Payer: Self-pay | Admitting: Internal Medicine

## 2015-08-25 ENCOUNTER — Other Ambulatory Visit: Payer: Self-pay

## 2015-08-25 MED ORDER — METFORMIN HCL 500 MG PO TABS: 500.0000 mg | ORAL_TABLET | Freq: Two times a day (BID) | ORAL | Status: DC

## 2015-09-03 ENCOUNTER — Encounter: Payer: Self-pay | Admitting: Internal Medicine

## 2015-09-04 ENCOUNTER — Other Ambulatory Visit: Payer: Self-pay

## 2015-09-04 MED ORDER — METFORMIN HCL 500 MG PO TABS: 500.0000 mg | ORAL_TABLET | Freq: Two times a day (BID) | ORAL | Status: DC

## 2015-09-05 ENCOUNTER — Other Ambulatory Visit: Payer: Self-pay | Admitting: *Deleted

## 2015-09-05 ENCOUNTER — Other Ambulatory Visit: Payer: Self-pay | Admitting: Internal Medicine

## 2015-09-05 MED ORDER — METFORMIN HCL 500 MG PO TABS: 500.0000 mg | ORAL_TABLET | Freq: Two times a day (BID) | ORAL | Status: DC

## 2015-09-30 ENCOUNTER — Encounter: Payer: Self-pay | Admitting: Internal Medicine

## 2015-09-30 ENCOUNTER — Other Ambulatory Visit: Payer: Self-pay | Admitting: Internal Medicine

## 2015-10-02 ENCOUNTER — Other Ambulatory Visit: Payer: Self-pay

## 2015-10-02 MED ORDER — CYCLOBENZAPRINE HCL 10 MG PO TABS: 10.0000 mg | ORAL_TABLET | Freq: Three times a day (TID) | ORAL | Status: DC | PRN

## 2015-10-02 NOTE — Telephone Encounter (Signed)
Please advise 

## 2015-10-02 NOTE — Telephone Encounter (Signed)
Rx faxed to [facility]

## 2015-10-02 NOTE — Telephone Encounter (Signed)
I am ok to refill flexeril #90 with one refill, but need to clarify where she wants this rx sent.  Thanks

## 2015-10-24 ENCOUNTER — Telehealth: Payer: Self-pay | Admitting: Internal Medicine

## 2015-10-24 NOTE — Telephone Encounter (Signed)
Pt had a really bad nose bleed yesterday and she states it bled for over an hour. Pt wants to know what should she do? Does Dr Nicki Reaper want to see her? It's not bleeding today. Thank You!

## 2015-10-24 NOTE — Telephone Encounter (Signed)
I reach out to patient and offered appt she did not want to see another provider. She will wait it out.

## 2015-11-07 ENCOUNTER — Other Ambulatory Visit: Payer: Self-pay

## 2015-11-07 MED ORDER — CLOPIDOGREL BISULFATE 75 MG PO TABS: 75.0000 mg | ORAL_TABLET | Freq: Every day | ORAL | Status: DC

## 2015-11-28 ENCOUNTER — Other Ambulatory Visit: Payer: Self-pay | Admitting: Internal Medicine

## 2015-12-23 ENCOUNTER — Other Ambulatory Visit: Payer: Self-pay | Admitting: Internal Medicine

## 2015-12-30 ENCOUNTER — Other Ambulatory Visit: Payer: Self-pay | Admitting: Internal Medicine

## 2016-01-01 ENCOUNTER — Other Ambulatory Visit: Payer: Self-pay

## 2016-01-01 MED ORDER — CITALOPRAM HYDROBROMIDE 40 MG PO TABS: 40.0000 mg | ORAL_TABLET | Freq: Every day | ORAL | Status: DC

## 2016-01-04 ENCOUNTER — Telehealth: Payer: Self-pay | Admitting: *Deleted

## 2016-01-04 MED ORDER — PANTOPRAZOLE SODIUM 40 MG PO TBEC: 40.0000 mg | DELAYED_RELEASE_TABLET | Freq: Every day | ORAL | Status: DC

## 2016-01-04 MED ORDER — AMLODIPINE BESYLATE 5 MG PO TABS: 5.0000 mg | ORAL_TABLET | Freq: Every day | ORAL | Status: DC

## 2016-01-04 NOTE — Telephone Encounter (Signed)
Sent per request.

## 2016-01-04 NOTE — Telephone Encounter (Signed)
Requested Medication refill for pantoprazole (PROTONIX) 40 MG tablet , amlodipine (NORVASC) 5 MG tablet  Patient uses [facility]  Pt [phone]

## 2016-01-17 ENCOUNTER — Other Ambulatory Visit: Payer: Self-pay | Admitting: Internal Medicine

## 2016-01-30 ENCOUNTER — Encounter: Payer: Self-pay | Admitting: Internal Medicine

## 2016-02-08 ENCOUNTER — Other Ambulatory Visit: Payer: Self-pay | Admitting: Internal Medicine

## 2016-02-13 ENCOUNTER — Encounter: Payer: Self-pay | Admitting: Internal Medicine

## 2016-02-15 ENCOUNTER — Telehealth: Payer: Self-pay | Admitting: Internal Medicine

## 2016-02-15 NOTE — Telephone Encounter (Signed)
Was not in box at 5pm when checked

## 2016-02-15 NOTE — Telephone Encounter (Signed)
[facility] dropped a refill request  for Dr.Scott to fill. Paper work is in Dr. Bary Leriche box.

## 2016-02-19 NOTE — Telephone Encounter (Signed)
Contacted pharmacy & form placed up front

## 2016-02-21 ENCOUNTER — Other Ambulatory Visit: Payer: Self-pay | Admitting: Internal Medicine

## 2016-03-06 ENCOUNTER — Encounter: Payer: Self-pay | Admitting: Internal Medicine

## 2016-03-07 ENCOUNTER — Encounter: Payer: Self-pay | Admitting: Internal Medicine

## 2016-03-07 NOTE — Telephone Encounter (Signed)
secondary e-mail.

## 2016-03-07 NOTE — Telephone Encounter (Signed)
I am not aware of any place that helps with getting a CPAP.  Are you aware?  Can you contact [facility] and see if they are aware of any options.  Thanks    Dr Nicki Reaper

## 2016-03-07 NOTE — Telephone Encounter (Signed)
To be able to send a prescription, I need her current setting for her CPAP.  I will send her a my chart message as well.  See if we can find out setting and will probably need to get the sleep study results.  Where and when did she have done.  If you are no longer covering for me, please send back to [place].  Thanks

## 2016-03-07 NOTE — Telephone Encounter (Signed)
Called pt she stated [facility] has taken care of all the things she needed done. She did want to know if you knew of any organizations that would help pay for CPAP supplies.

## 2016-03-09 NOTE — Telephone Encounter (Signed)
I am not aware of an organization that helps pay for supplies.  She could ask [facility] if they are aware of anyway pts get help paying.  Let me know if I need to do anything.

## 2016-03-28 ENCOUNTER — Emergency Department
Admission: EM | Admit: 2016-03-28 | Discharge: 2016-03-28 | Disposition: A | Discharge status: Home / Self Care | Payer: Self-pay | Attending: Emergency Medicine | Admitting: Emergency Medicine

## 2016-03-28 ENCOUNTER — Encounter: Payer: Self-pay | Admitting: Emergency Medicine

## 2016-03-28 ENCOUNTER — Emergency Department: Payer: Self-pay

## 2016-03-28 DIAGNOSIS — M7521 Bicipital tendinitis, right shoulder: Secondary | ICD-10-CM | POA: Insufficient documentation

## 2016-03-28 DIAGNOSIS — F17210 Nicotine dependence, cigarettes, uncomplicated: Secondary | ICD-10-CM | POA: Insufficient documentation

## 2016-03-28 DIAGNOSIS — I10 Essential (primary) hypertension: Secondary | ICD-10-CM | POA: Insufficient documentation

## 2016-03-28 DIAGNOSIS — E119 Type 2 diabetes mellitus without complications: Secondary | ICD-10-CM | POA: Insufficient documentation

## 2016-03-28 DIAGNOSIS — F329 Major depressive disorder, single episode, unspecified: Secondary | ICD-10-CM | POA: Insufficient documentation

## 2016-03-28 DIAGNOSIS — Z7982 Long term (current) use of aspirin: Secondary | ICD-10-CM | POA: Insufficient documentation

## 2016-03-28 DIAGNOSIS — Z79899 Other long term (current) drug therapy: Secondary | ICD-10-CM | POA: Insufficient documentation

## 2016-03-28 DIAGNOSIS — J449 Chronic obstructive pulmonary disease, unspecified: Secondary | ICD-10-CM | POA: Insufficient documentation

## 2016-03-28 DIAGNOSIS — J45909 Unspecified asthma, uncomplicated: Secondary | ICD-10-CM | POA: Insufficient documentation

## 2016-03-28 DIAGNOSIS — M1990 Unspecified osteoarthritis, unspecified site: Secondary | ICD-10-CM | POA: Insufficient documentation

## 2016-03-28 MED ORDER — OXYCODONE-ACETAMINOPHEN 5-325 MG PO TABS: 1.0000 | ORAL_TABLET | ORAL | Status: DC | PRN

## 2016-03-28 MED ORDER — NAPROXEN 500 MG PO TABS
500.0000 mg | ORAL_TABLET | Freq: Once | ORAL | Status: AC
  Administered 2016-03-28: 500 mg via ORAL
  Filled 2016-03-28: qty 1

## 2016-03-28 MED ORDER — NAPROXEN 500 MG PO TABS: 500.0000 mg | ORAL_TABLET | Freq: Two times a day (BID) | ORAL | Status: DC

## 2016-03-28 NOTE — ED Notes (Signed)
Stats she picked up a container of cat litter from the shelf to place in cart  Felt a pop in right upper arm yesterday  No deformity noted

## 2016-03-28 NOTE — ED Notes (Signed)
Pt reports right arm pain since lifting 40lb container of cat litter; reports she heard a pop. Pt reports pain from bicep up to shoulder.

## 2016-03-28 NOTE — Discharge Instructions (Signed)
Were sling for 3-5 days as needed.

## 2016-03-28 NOTE — ED Provider Notes (Signed)
[facility] Emergency Department Provider Note   ____________________________________________  Time seen: Approximately 10:25 AM  I have reviewed the triage vital signs and the nursing notes.   HISTORY  Chief Complaint Arm Pain    HPI Kathryn Friedman is a 60 y.o. female patient complain right upper arm pain secondary to lifting incident. Patient states she lifted a heavy bag of cat litter from a shelf put in a car. Patient states he felt a popping upper arm and since then she's had pain with full extension of the right upper extremity. Patient states decreased pain by keeping him in adduction. Patient denies any loss sensation. Patient denies any swelling or bruising. Patient taken Tylenol for her complaint of no relief. Patient rates the pain as 8/10.   Past Medical History  Diagnosis Date  . Hypertension   . Depression     secondary to the death of her husband (died 22)  . COPD (chronic obstructive pulmonary disease) ([place])   . Diabetes mellitus without complication ([place]) Diagnosed 05/2008  . Sleep apnea     on CPAP  . Spastic colon   . Coronary artery disease     s/p stent placement 06/25/06  . Hypertriglyceridemia   . Asthma   . GERD (gastroesophageal reflux disease)   . Anemia   . Arthritis     back and knees  . Diverticulitis     Patient Active Problem List   Diagnosis Date Noted  . Lump in the abdomen 08/06/2015  . Dizziness 08/06/2015  . GERD (gastroesophageal reflux disease) 10/20/2013  . Iron deficiency anemia 10/20/2013  . Nausea with vomiting 10/19/2013  . LUQ pain 10/19/2013  . Diarrhea 09/26/2013  . Anemia 04/12/2013  . CAD (coronary artery disease) 02/18/2013  . COPD (chronic obstructive pulmonary disease) ([place]) 02/18/2013  . Obstructive sleep apnea 02/18/2013  . Depression 02/18/2013  . Diverticulosis 02/18/2013  . Diabetes ([place]) 02/17/2013  . Essential hypertension, benign 02/17/2013  . Hypercholesterolemia  02/17/2013    Past Surgical History  Procedure Laterality Date  . Cesarean section  1984  . Cholecystectomy  1985  . Shoulder surgery  shoulder operation secondary to a torn tendon  . Abdominal hysterectomy  with left ovary in place 1996  . Appendectomy  1985  . Replacement total knee  ([facility])  . Knee surgery  status post knee surgey   . Knee arthroscopy  Arthroscopic left knee surgery     Current Outpatient Rx  Name  Route  Sig  Dispense  Refill  . acetaminophen (TYLENOL) 500 MG tablet   Oral   Take 500 mg by mouth every 6 (six) hours as needed for pain.         Marland Kitchen albuterol (VENTOLIN HFA) 108 (90 BASE) MCG/ACT inhaler   Inhalation   Inhale 2 puffs into the lungs every 4 (four) hours as needed.   18 g   0   . amLODipine (NORVASC) 5 MG tablet   Oral   Take 1 tablet (5 mg total) by mouth daily.   90 tablet   0   . aspirin 81 MG tablet   Oral   Take 81 mg by mouth daily. Take 1 tablet by mouth once a day as directed         . Calcium Carbonate-Vitamin D (CALCIUM 600 + D PO)   Oral   Take 600 mg by mouth daily.         . citalopram (CELEXA) 40 MG tablet  Oral   Take 1 tablet (40 mg total) by mouth daily.   90 tablet   1   . clopidogrel (PLAVIX) 75 MG tablet   Oral   Take 1 tablet (75 mg total) by mouth daily.   90 tablet   1   . cyclobenzaprine (FLEXERIL) 10 MG tablet      TAKE 1 TABLET BY MOUTH 3 TIMES A DAY AS NEEDED FOR MUSCLE SPASMS.   90 tablet   0   . Fluticasone-Salmeterol (ADVAIR) 250-50 MCG/DOSE AEPB   Inhalation   Inhale 1 puff into the lungs every 12 (twelve) hours.   180 each   1   . metFORMIN (GLUCOPHAGE) 500 MG tablet      TAKE ONE TABLET BY MOUTH 2 TIMES A DAY WITH A MEAL.   180 tablet   1   . naproxen (NAPROSYN) 500 MG tablet   Oral   Take 1 tablet (500 mg total) by mouth 2 (two) times daily with a meal.   20 tablet   0   . oxyCODONE-acetaminophen (ROXICET) 5-325 MG tablet   Oral   Take 1 tablet by mouth every 4 (four)  hours as needed for severe pain.   30 tablet   0   . pantoprazole (PROTONIX) 40 MG tablet   Oral   Take 1 tablet (40 mg total) by mouth daily.   90 tablet   0   . propranolol (INDERAL) 20 MG tablet      TAKE TWO TABLETS BY MOUTH 2 TIMES A DAY.   360 tablet   0     PATIENT OUT.   . rosuvastatin (CRESTOR) 40 MG tablet   Oral   Take 0.5 tablets (20 mg total) by mouth daily.   45 tablet   1     Allergies Review of patient's allergies indicates no known allergies.  Family History  Problem Relation Age of Onset  . Other Mother     Hit by a fire truck and has had multiple operations on her back , and has history of MVP   . Mitral valve prolapse Mother   . Heart disease Father     myocardial infarction and is status post bypass surgery  . Mitral valve prolapse Sister   . Hepatitis C Brother   . Cirrhosis Brother   . Colon cancer Paternal Aunt     Social History Social History  Substance Use Topics  . Smoking status: Current Every Day Smoker -- 1.50 packs/day    Types: Cigarettes  . Smokeless tobacco: Never Used  . Alcohol Use: No    Review of Systems Constitutional: No fever/chills Eyes: No visual changes. ENT: No sore throat. Cardiovascular: Denies chest pain. Respiratory: Denies shortness of breath. Gastrointestinal: No abdominal pain.  No nausea, no vomiting.  No diarrhea.  No constipation. Genitourinary: Negative for dysuria. Musculoskeletal: Right upper arm pain Skin: Negative for rash. Neurological: Negative for headaches, focal weakness or numbness. Psychiatric:Depression Endocrine: Hypertension, hyperlipidemia, and diabetes. ____________________________________________   PHYSICAL EXAM:  VITAL SIGNS: ED Triage Vitals  Enc Vitals Group     BP 03/28/16 1019 112/68 mmHg     Pulse Rate 03/28/16 1019 71     Resp 03/28/16 1019 20     Temp 03/28/16 1019 98.4 F (36.9 C)     Temp Source 03/28/16 1019 Oral     SpO2 03/28/16 1019 94 %     Weight  03/28/16 1019 222 lb (100.699 kg)     Height 03/28/16  1019 5\' 4"  (1.626 m)     Head Cir --      Peak Flow --      Pain Score 03/28/16 1019 8     Pain Loc --      Pain Edu? --      Excl. in [place]? --     Constitutional: Alert and oriented. Well appearing and in no acute distress. Eyes: Conjunctivae are normal. PERRL. EOMI. Head: Atraumatic. Nose: No congestion/rhinnorhea. Mouth/Throat: Mucous membranes are moist.  Oropharynx non-erythematous. Neck: No stridor.  No cervical spine tenderness to palpation. Hematological/Lymphatic/Immunilogical: No cervical lymphadenopathy. Cardiovascular: Normal rate, regular rhythm. Grossly normal heart sounds.  Good peripheral circulation. Respiratory: Normal respiratory effort.  No retractions. Lungs CTAB. Gastrointestinal: Soft and nontender. No distention. No abdominal bruits. No CVA tenderness. Musculoskeletal: No obvious deformity deformity, edema or erythema to the right upper extremity. Patient has decreased range of motion with extension limited to 170 before complaining of pain. Patient has a positive Yergason's Test. Neurologic:  Normal speech and language. No gross focal neurologic deficits are appreciated. No gait instability. Skin:  Skin is warm, dry and intact. No rash noted. Psychiatric: Mood and affect are normal. Speech and behavior are normal.  ____________________________________________   LABS (all labs ordered are listed, but only abnormal results are displayed)  Labs Reviewed - No data to display ____________________________________________  EKG   ____________________________________________  RADIOLOGY  No acute findings on x-ray. I, Sable Feil, personally viewed and evaluated these images (plain radiographs) as part of my medical decision making, as well as reviewing the written report by the radiologist.  ____________________________________________   PROCEDURES  Procedure(s) performed: None  Critical Care  performed: No  ____________________________________________   INITIAL IMPRESSION / ASSESSMENT AND PLAN / ED COURSE  Pertinent labs & imaging results that were available during my care of the patient were reviewed by me and considered in my medical decision making (see chart for details).  Bicep tendinitis. Patient given discharge care instructions. Patient given a prescription for naproxen and Percocets. Patient given a sling and advised to wear for 3-5 days as needed. Patient given a work note for 3 days. Patient advised follow-up family doctor if no improvement in one week. ____________________________________________   FINAL CLINICAL IMPRESSION(S) / ED DIAGNOSES  Final diagnoses:  Biceps tendinitis on right      NEW MEDICATIONS STARTED DURING THIS VISIT:  New Prescriptions   NAPROXEN (NAPROSYN) 500 MG TABLET    Take 1 tablet (500 mg total) by mouth 2 (two) times daily with a meal.   OXYCODONE-ACETAMINOPHEN (ROXICET) 5-325 MG TABLET    Take 1 tablet by mouth every 4 (four) hours as needed for severe pain.     Note:  This document was prepared using Dragon voice recognition software and may include unintentional dictation errors.    Sable Feil, PA-C 03/28/16 1136  Lavonia Drafts, MD 03/28/16 1248

## 2016-03-30 ENCOUNTER — Other Ambulatory Visit: Payer: Self-pay | Admitting: Internal Medicine

## 2016-04-01 NOTE — Telephone Encounter (Signed)
Refilled 01/2016. Last seen 07/2015. Please advise?

## 2016-04-01 NOTE — Telephone Encounter (Signed)
She needs a f/u appt scheduled.  I will refill x 1, but she needs to keep appt for any more refills.

## 2016-04-01 NOTE — Telephone Encounter (Signed)
LV[facility]

## 2016-04-21 ENCOUNTER — Other Ambulatory Visit: Payer: Self-pay | Admitting: Internal Medicine

## 2016-04-23 ENCOUNTER — Other Ambulatory Visit: Payer: Self-pay | Admitting: Internal Medicine

## 2016-05-06 ENCOUNTER — Encounter: Payer: Self-pay | Admitting: Internal Medicine

## 2016-05-06 ENCOUNTER — Ambulatory Visit (INDEPENDENT_AMBULATORY_CARE_PROVIDER_SITE_OTHER): Payer: Self-pay | Admitting: Internal Medicine

## 2016-05-06 VITALS — BP 110/70 | HR 80 | Temp 98.7°F | Resp 91 | Ht 64.5 in | Wt 228.0 lb

## 2016-05-06 DIAGNOSIS — E7800 Pure hypercholesterolemia, unspecified: Secondary | ICD-10-CM

## 2016-05-06 DIAGNOSIS — I2510 Atherosclerotic heart disease of native coronary artery without angina pectoris: Secondary | ICD-10-CM

## 2016-05-06 DIAGNOSIS — I10 Essential (primary) hypertension: Secondary | ICD-10-CM

## 2016-05-06 DIAGNOSIS — J449 Chronic obstructive pulmonary disease, unspecified: Secondary | ICD-10-CM

## 2016-05-06 DIAGNOSIS — R1900 Intra-abdominal and pelvic swelling, mass and lump, unspecified site: Secondary | ICD-10-CM

## 2016-05-06 DIAGNOSIS — F32A Depression, unspecified: Secondary | ICD-10-CM

## 2016-05-06 DIAGNOSIS — F329 Major depressive disorder, single episode, unspecified: Secondary | ICD-10-CM

## 2016-05-06 DIAGNOSIS — D649 Anemia, unspecified: Secondary | ICD-10-CM

## 2016-05-06 DIAGNOSIS — G4733 Obstructive sleep apnea (adult) (pediatric): Secondary | ICD-10-CM

## 2016-05-06 DIAGNOSIS — E119 Type 2 diabetes mellitus without complications: Secondary | ICD-10-CM

## 2016-05-06 DIAGNOSIS — K219 Gastro-esophageal reflux disease without esophagitis: Secondary | ICD-10-CM

## 2016-05-06 DIAGNOSIS — Z1239 Encounter for other screening for malignant neoplasm of breast: Secondary | ICD-10-CM

## 2016-05-06 MED ORDER — CLOPIDOGREL BISULFATE 75 MG PO TABS: 75.0000 mg | ORAL_TABLET | Freq: Every day | ORAL | Status: DC

## 2016-05-06 MED ORDER — PROPRANOLOL HCL 20 MG PO TABS: ORAL_TABLET | ORAL | Status: DC

## 2016-05-06 MED ORDER — AMLODIPINE BESYLATE 5 MG PO TABS: ORAL_TABLET | ORAL | Status: DC

## 2016-05-06 MED ORDER — FLUTICASONE-SALMETEROL 250-50 MCG/DOSE IN AEPB: 1.0000 | INHALATION_SPRAY | Freq: Two times a day (BID) | RESPIRATORY_TRACT | Status: DC

## 2016-05-06 MED ORDER — PANTOPRAZOLE SODIUM 40 MG PO TBEC: DELAYED_RELEASE_TABLET | ORAL | Status: DC

## 2016-05-06 MED ORDER — CYCLOBENZAPRINE HCL 10 MG PO TABS: ORAL_TABLET | ORAL | Status: DC

## 2016-05-06 MED ORDER — METFORMIN HCL 500 MG PO TABS: ORAL_TABLET | ORAL | Status: DC

## 2016-05-06 MED ORDER — CITALOPRAM HYDROBROMIDE 40 MG PO TABS: 40.0000 mg | ORAL_TABLET | Freq: Every day | ORAL | Status: DC

## 2016-05-06 NOTE — Progress Notes (Signed)
Patient ID: VERLEY PARISEAU, female   DOB: Oct 10, 1956, 60 y.o.   MRN: [id number]   Subjective:    Patient ID: SHANERA MESKE, female    DOB: 10/26/1956, 60 y.o.   MRN: [id number]  HPI  Patient here for a scheduled follow up.  Increased stress with her mother's recent death.  Discussed with her today.  She is due to see her therapist in a few days.  She is taking her medication.  Still smoking.  Discussed the need to quit.  She declines.  Some increased cough at times.  No increased sob.  No chest pain.  No nausea or vomiting.  Bowels stable.  Reports persistent "knot" in her stomach.     Past Medical History  Diagnosis Date  . Hypertension   . Depression     secondary to the death of her husband (died 47)  . COPD (chronic obstructive pulmonary disease) ([place])   . Diabetes mellitus without complication ([place]) Diagnosed 05/2008  . Sleep apnea     on CPAP  . Spastic colon   . Coronary artery disease     s/p stent placement 06/25/06  . Hypertriglyceridemia   . Asthma   . GERD (gastroesophageal reflux disease)   . Anemia   . Arthritis     back and knees  . Diverticulitis    Past Surgical History  Procedure Laterality Date  . Cesarean section  1984  . Cholecystectomy  1985  . Shoulder surgery  shoulder operation secondary to a torn tendon  . Abdominal hysterectomy  with left ovary in place 1996  . Appendectomy  1985  . Replacement total knee  ([facility])  . Knee surgery  status post knee surgey   . Knee arthroscopy  Arthroscopic left knee surgery    Family History  Problem Relation Age of Onset  . Other Mother     Hit by a fire truck and has had multiple operations on her back , and has history of MVP   . Mitral valve prolapse Mother   . Heart disease Father     myocardial infarction and is status post bypass surgery  . Mitral valve prolapse Sister   . Hepatitis C Brother   . Cirrhosis Brother   . Colon cancer Paternal Aunt    Social History   Social History  . Marital  Status: Widowed    Spouse Name: N/A  . Number of Children: 1  . Years of Education: N/A   Occupational History  .     Social History Main Topics  . Smoking status: Current Every Day Smoker -- 1.50 packs/day    Types: Cigarettes  . Smokeless tobacco: Never Used  . Alcohol Use: No  . Drug Use: No  . Sexual Activity: Not Asked   Other Topics Concern  . None   Social History Narrative    Outpatient Encounter Prescriptions as of 05/06/2016  Medication Sig  . acetaminophen (TYLENOL) 500 MG tablet Take 500 mg by mouth every 6 (six) hours as needed for pain.  Marland Kitchen albuterol (VENTOLIN HFA) 108 (90 BASE) MCG/ACT inhaler Inhale 2 puffs into the lungs every 4 (four) hours as needed.  Marland Kitchen amLODipine (NORVASC) 5 MG tablet Take 1 tablet (5 mg total) by mouth daily.  Marland Kitchen aspirin 81 MG tablet Take 81 mg by mouth daily. Take 1 tablet by mouth once a day as directed  . atorvastatin (LIPITOR) 40 MG tablet Take 40 mg by mouth daily.  . Calcium Carbonate-Vitamin D (CALCIUM  600 + D PO) Take 600 mg by mouth daily.  . citalopram (CELEXA) 40 MG tablet Take 1 tablet (40 mg total) by mouth daily.  . clopidogrel (PLAVIX) 75 MG tablet Take 1 tablet (75 mg total) by mouth daily.  . cyclobenzaprine (FLEXERIL) 10 MG tablet TAKE 1 TABLET BY MOUTH 3 TIMES A DAY AS NEEDED FOR MUSCLE SPASMS.  . Fluticasone-Salmeterol (ADVAIR) 250-50 MCG/DOSE AEPB Inhale 1 puff into the lungs every 12 (twelve) hours.  . metFORMIN (GLUCOPHAGE) 500 MG tablet TAKE ONE TABLET BY MOUTH 2 TIMES A DAY WITH A MEAL.  . naproxen (NAPROSYN) 500 MG tablet Take 1 tablet (500 mg total) by mouth 2 (two) times daily with a meal.  . oxyCODONE-acetaminophen (ROXICET) 5-325 MG tablet Take 1 tablet by mouth every 4 (four) hours as needed for severe pain.  . pantoprazole (PROTONIX) 40 MG tablet Take 1 tablet (40 mg total) by mouth daily.  . propranolol (INDERAL) 20 MG tablet TAKE TWO TABLETS BY MOUTH 2 TIMES A DAY.  . [DISCONTINUED] amLODipine (NORVASC) 5 MG  tablet Take 1 tablet (5 mg total) by mouth daily.  . [DISCONTINUED] amLODipine (NORVASC) 5 MG tablet Take 1 tablet (5 mg total) by mouth daily.  . [DISCONTINUED] citalopram (CELEXA) 40 MG tablet Take 1 tablet (40 mg total) by mouth daily.  . [DISCONTINUED] clopidogrel (PLAVIX) 75 MG tablet Take 1 tablet (75 mg total) by mouth daily.  . [DISCONTINUED] cyclobenzaprine (FLEXERIL) 10 MG tablet TAKE 1 TABLET BY MOUTH 3 TIMES A DAY AS NEEDED FOR MUSCLE SPASMS.  . [DISCONTINUED] Fluticasone-Salmeterol (ADVAIR) 250-50 MCG/DOSE AEPB Inhale 1 puff into the lungs every 12 (twelve) hours.  . [DISCONTINUED] metFORMIN (GLUCOPHAGE) 500 MG tablet TAKE ONE TABLET BY MOUTH 2 TIMES A DAY WITH A MEAL.  . [DISCONTINUED] pantoprazole (PROTONIX) 40 MG tablet Take 1 tablet (40 mg total) by mouth daily.  . [DISCONTINUED] propranolol (INDERAL) 20 MG tablet TAKE TWO TABLETS BY MOUTH 2 TIMES A DAY.  . [DISCONTINUED] rosuvastatin (CRESTOR) 40 MG tablet Take 0.5 tablets (20 mg total) by mouth daily.   No facility-administered encounter medications on file as of 05/06/2016.    Review of Systems  Constitutional:       Has lost weight from my last check.  Reports some decreased appetite dealing with her mother's death.    HENT: Negative for congestion and sinus pressure.   Respiratory: Positive for cough. Negative for chest tightness and shortness of breath.   Cardiovascular: Negative for chest pain, palpitations and leg swelling.  Gastrointestinal: Negative for nausea, vomiting and diarrhea.  Genitourinary: Negative for dysuria and difficulty urinating.  Musculoskeletal: Negative for myalgias and joint swelling.  Skin: Negative for color change and rash.  Neurological: Negative for dizziness, light-headedness and headaches.  Psychiatric/Behavioral: Negative for agitation.       Some increased stress dealing with her mother's death.         Objective:    Physical Exam  Constitutional: She appears well-developed and  well-nourished. No distress.  HENT:  Nose: Nose normal.  Mouth/Throat: Oropharynx is clear and moist.  Neck: Neck supple. No thyromegaly present.  Cardiovascular: Normal rate and regular rhythm.   Pulmonary/Chest: Breath sounds normal. No respiratory distress. She has no wheezes.  Abdominal: Soft. Bowel sounds are normal.  Some epigastric tenderness and fullness - mid abdomen.    Musculoskeletal: She exhibits no edema or tenderness.  Lymphadenopathy:    She has no cervical adenopathy.  Skin: No rash noted. No erythema.  Psychiatric: She  has a normal mood and affect. Her behavior is normal.    BP 110/70 mmHg  Pulse 80  Temp(Src) 98.7 F (37.1 C) (Oral)  Resp 91  Ht 5' 4.5" (1.638 m)  Wt 228 lb (103.42 kg)  BMI 38.55 kg/m2 Wt Readings from Last 3 Encounters:  05/06/16 228 lb (103.42 kg)  03/28/16 222 lb (100.699 kg)  08/01/15 251 lb 6 oz (114.023 kg)     Lab Results  Component Value Date   WBC 10.5 08/01/2015   HGB 13.9 08/01/2015   HCT 43.2 08/01/2015   PLT 202.0 08/01/2015   GLUCOSE 95 08/01/2015   CHOL 93 08/01/2015   TRIG 101.0 08/01/2015   HDL 41.20 08/01/2015   LDLCALC 31 08/01/2015   ALT 18 08/01/2015   AST 29 08/01/2015   NA 137 08/01/2015   K 4.3 08/01/2015   CL 103 08/01/2015   CREATININE 0.72 08/01/2015   BUN 4* 08/01/2015   CO2 25 08/01/2015   TSH 1.52 08/01/2015   HGBA1C 7.2* 08/01/2015   MICROALBUR 0.1 06/10/2014       Assessment & Plan:   Problem List Items Addressed This Visit    Anemia    Had colonoscopy and EGD 2015.  Follow cbc.       CAD (coronary artery disease)    S/p stent placement.  Has seen cardiology.  Currently asymptomatic.  Continue risk factor modification.  Overdue labs.  States can't have labs checked until July.  Will have insurance then.  Follow.       Relevant Medications   atorvastatin (LIPITOR) 40 MG tablet   amLODipine (NORVASC) 5 MG tablet   propranolol (INDERAL) 20 MG tablet   COPD (chronic obstructive  pulmonary disease) ([facility])    Previously saw Dr Raul Del.  Continue advair and ventolin.  Overall breathing appears to be stable today.  Follow.  Discussed the need to stop smoking.        Relevant Medications   Fluticasone-Salmeterol (ADVAIR) 250-50 MCG/DOSE AEPB   Depression    On citalopram.  Due to see her therapist this week.  Dealing with her mother's recent death.        Relevant Medications   citalopram (CELEXA) 40 MG tablet   Diabetes ([place])    She has lost weight.  Has had decreased appetite since her mother's death. Have discussed low carb diet and exercise.  Follow met b and a1c.        Relevant Medications   atorvastatin (LIPITOR) 40 MG tablet   metFORMIN (GLUCOPHAGE) 500 MG tablet   Other Relevant Orders   Hemoglobin Z6X   Basic metabolic panel   Microalbumin / creatinine urine ratio   Essential hypertension, benign    Blood pressure under good control.  Continue same medication regimen.  Follow pressures.  Follow metabolic panel.        Relevant Medications   atorvastatin (LIPITOR) 40 MG tablet   amLODipine (NORVASC) 5 MG tablet   propranolol (INDERAL) 20 MG tablet   Other Relevant Orders   CBC with Differential/Platelet   TSH   GERD (gastroesophageal reflux disease)    Symptoms controlled on protonix.        Relevant Medications   pantoprazole (PROTONIX) 40 MG tablet   Hypercholesterolemia    On lipitor.  Low cholesterol diet and exercise.  Follow lipid panel and liver function tests.        Relevant Medications   atorvastatin (LIPITOR) 40 MG tablet   amLODipine (NORVASC) 5 MG tablet  propranolol (INDERAL) 20 MG tablet   Other Relevant Orders   Lipid panel   Hepatic function panel   Lump in the abdomen    Some fullness noted on exam and some epigastric tenderness.  Check liver panel, cbc and metabolic panel.  Discussed CT given tenderness, etc.  She agrees, but wants to hold on scanning until July.  Follow.        Obstructive sleep apnea    CPAP         Other Visit Diagnoses    Screening breast examination    -  Primary    Relevant Orders    MM DIGITAL SCREENING BILATERAL        Einar Pheasant, MD

## 2016-05-06 NOTE — Progress Notes (Signed)
Pre-visit discussion using our clinic review tool. No additional management support is needed unless otherwise documented below in the visit note.  

## 2016-05-07 ENCOUNTER — Encounter: Payer: Self-pay | Admitting: Internal Medicine

## 2016-05-07 NOTE — Assessment & Plan Note (Signed)
Had colonoscopy and EGD 2015.  Follow cbc.

## 2016-05-07 NOTE — Assessment & Plan Note (Signed)
CPAP.  

## 2016-05-07 NOTE — Assessment & Plan Note (Signed)
Blood pressure under good control.  Continue same medication regimen.  Follow pressures.  Follow metabolic panel.   

## 2016-05-07 NOTE — Assessment & Plan Note (Signed)
S/p stent placement.  Has seen cardiology.  Currently asymptomatic.  Continue risk factor modification.  Overdue labs.  States can't have labs checked until July.  Will have insurance then.  Follow.

## 2016-05-07 NOTE — Assessment & Plan Note (Signed)
On citalopram.  Due to see her therapist this week.  Dealing with her mother's recent death.

## 2016-05-07 NOTE — Assessment & Plan Note (Signed)
Some fullness noted on exam and some epigastric tenderness.  Check liver panel, cbc and metabolic panel.  Discussed CT given tenderness, etc.  She agrees, but wants to hold on scanning until July.  Follow.

## 2016-05-07 NOTE — Assessment & Plan Note (Signed)
Symptoms controlled on protonix.   

## 2016-05-07 NOTE — Assessment & Plan Note (Signed)
She has lost weight.  Has had decreased appetite since her mother's death. Have discussed low carb diet and exercise.  Follow met b and a1c.

## 2016-05-07 NOTE — Assessment & Plan Note (Signed)
On lipitor.  Low cholesterol diet and exercise.  Follow lipid panel and liver function tests.   

## 2016-05-07 NOTE — Assessment & Plan Note (Signed)
Previously saw Dr Raul Del.  Continue advair and ventolin.  Overall breathing appears to be stable today.  Follow.  Discussed the need to stop smoking.

## 2016-05-24 ENCOUNTER — Telehealth: Payer: Self-pay | Admitting: Internal Medicine

## 2016-05-24 NOTE — Telephone Encounter (Signed)
[facility] dropped off paper work to be filled out papers in Dr. Bary Leriche box.

## 2016-05-25 NOTE — Telephone Encounter (Signed)
Placed in folder on Friday

## 2016-05-26 NOTE — Telephone Encounter (Signed)
Signed.  Placed in your box.

## 2016-05-27 ENCOUNTER — Other Ambulatory Visit: Payer: Self-pay

## 2016-05-27 NOTE — Telephone Encounter (Signed)
Rx's placed up front for pick up & Annette with medication management is aware.

## 2016-05-30 ENCOUNTER — Ambulatory Visit: Payer: Self-pay

## 2016-06-08 ENCOUNTER — Other Ambulatory Visit: Payer: Self-pay | Admitting: Internal Medicine

## 2016-06-10 NOTE — Telephone Encounter (Signed)
Refill request for Flexiril , last seen [id number], last filled [id number].  Please advise.

## 2016-06-10 NOTE — Telephone Encounter (Signed)
I ok'd rx for flexeril #90 with one refills.  I printed, because I think this has to be faxed.  Thanks

## 2016-06-11 NOTE — Telephone Encounter (Signed)
Was signed by Dr. Gilford Rile & faxed by Fransisco Beau, CMA

## 2016-06-14 ENCOUNTER — Ambulatory Visit
Admission: RE | Admit: 2016-06-14 | Discharge: 2016-06-14 | Disposition: A | Discharge status: Home / Self Care | Payer: 59 | Source: Ambulatory Visit | Attending: Internal Medicine | Admitting: Internal Medicine

## 2016-06-14 ENCOUNTER — Other Ambulatory Visit: Payer: Self-pay | Admitting: Internal Medicine

## 2016-06-14 ENCOUNTER — Other Ambulatory Visit (INDEPENDENT_AMBULATORY_CARE_PROVIDER_SITE_OTHER): Payer: 59

## 2016-06-14 DIAGNOSIS — I10 Essential (primary) hypertension: Secondary | ICD-10-CM

## 2016-06-14 DIAGNOSIS — Z1239 Encounter for other screening for malignant neoplasm of breast: Secondary | ICD-10-CM

## 2016-06-14 DIAGNOSIS — E119 Type 2 diabetes mellitus without complications: Secondary | ICD-10-CM

## 2016-06-14 DIAGNOSIS — E7800 Pure hypercholesterolemia, unspecified: Secondary | ICD-10-CM | POA: Diagnosis not present

## 2016-06-14 DIAGNOSIS — Z1231 Encounter for screening mammogram for malignant neoplasm of breast: Secondary | ICD-10-CM | POA: Diagnosis not present

## 2016-06-14 LAB — CBC WITH DIFFERENTIAL/PLATELET
BASOS ABS: 0 10*3/uL (ref 0.0–0.1)
Basophils Relative: 0.4 % (ref 0.0–3.0)
EOS ABS: 0.2 10*3/uL (ref 0.0–0.7)
Eosinophils Relative: 1.7 % (ref 0.0–5.0)
HEMATOCRIT: 40.6 % (ref 36.0–46.0)
HEMOGLOBIN: 13.1 g/dL (ref 12.0–15.0)
LYMPHS PCT: 22.3 % (ref 12.0–46.0)
Lymphs Abs: 2.4 10*3/uL (ref 0.7–4.0)
MCHC: 32.3 g/dL (ref 30.0–36.0)
MCV: 79.2 fl (ref 78.0–100.0)
Monocytes Absolute: 0.4 10*3/uL (ref 0.1–1.0)
Monocytes Relative: 3.5 % (ref 3.0–12.0)
NEUTROS ABS: 7.8 10*3/uL — AB (ref 1.4–7.7)
Neutrophils Relative %: 72.1 % (ref 43.0–77.0)
PLATELETS: 243 10*3/uL (ref 150.0–400.0)
RBC: 5.13 Mil/uL — AB (ref 3.87–5.11)
RDW: 21.9 % — ABNORMAL HIGH (ref 11.5–15.5)
WBC: 10.8 10*3/uL — AB (ref 4.0–10.5)

## 2016-06-14 LAB — HEPATIC FUNCTION PANEL
ALBUMIN: 3.7 g/dL (ref 3.5–5.2)
ALT: 12 U/L (ref 0–35)
AST: 21 U/L (ref 0–37)
Alkaline Phosphatase: 76 U/L (ref 39–117)
BILIRUBIN DIRECT: 0 mg/dL (ref 0.0–0.3)
TOTAL PROTEIN: 7.2 g/dL (ref 6.0–8.3)
Total Bilirubin: 0.4 mg/dL (ref 0.2–1.2)

## 2016-06-14 LAB — BASIC METABOLIC PANEL
BUN: 6 mg/dL (ref 6–23)
CALCIUM: 9.3 mg/dL (ref 8.4–10.5)
CO2: 25 meq/L (ref 19–32)
CREATININE: 0.74 mg/dL (ref 0.40–1.20)
Chloride: 102 mEq/L (ref 96–112)
GFR: 85.01 mL/min (ref >60.00)
GLUCOSE: 116 mg/dL — AB (ref 70–99)
POTASSIUM: 3.7 meq/L (ref 3.5–5.1)
SODIUM: 134 meq/L — AB (ref 135–145)

## 2016-06-14 LAB — LIPID PANEL
CHOL/HDL RATIO: 5
CHOLESTEROL: 178 mg/dL (ref 0–200)
HDL: 39 mg/dL — AB (ref >39.00)
LDL CALC: 105 mg/dL — AB (ref 0–99)
NonHDL: 138.5
TRIGLYCERIDES: 166 mg/dL — AB (ref 0.0–149.0)
VLDL: 33.2 mg/dL (ref 0.0–40.0)

## 2016-06-14 LAB — MICROALBUMIN / CREATININE URINE RATIO
Creatinine,U: 140.6 mg/dL
Microalb Creat Ratio: 0.5 mg/g (ref 0.0–30.0)
Microalb, Ur: <0.7 mg/dL (ref 0.0–1.9)

## 2016-06-14 LAB — HEMOGLOBIN A1C: Hgb A1c MFr Bld: 7.6 % — ABNORMAL HIGH (ref 4.6–6.5)

## 2016-06-14 LAB — TSH: TSH: 1.76 u[IU]/mL (ref 0.35–4.50)

## 2016-06-17 ENCOUNTER — Telehealth: Payer: Self-pay | Admitting: Internal Medicine

## 2016-06-17 NOTE — Telephone Encounter (Signed)
Tyrone Schimke [id number] called from [facility] will be sending a medication request via fax for pt. Thank you!

## 2016-06-17 NOTE — Telephone Encounter (Signed)
Noted, thanks!

## 2016-06-18 ENCOUNTER — Other Ambulatory Visit: Payer: Self-pay | Admitting: Internal Medicine

## 2016-06-18 ENCOUNTER — Encounter: Payer: Self-pay | Admitting: Internal Medicine

## 2016-06-18 DIAGNOSIS — E871 Hypo-osmolality and hyponatremia: Secondary | ICD-10-CM

## 2016-06-18 DIAGNOSIS — Z0000 Encounter for general adult medical examination without abnormal findings: Secondary | ICD-10-CM | POA: Insufficient documentation

## 2016-06-28 ENCOUNTER — Telehealth: Payer: Self-pay | Admitting: *Deleted

## 2016-06-28 NOTE — Telephone Encounter (Signed)
Not received yet today thanks

## 2016-06-28 NOTE — Telephone Encounter (Signed)
Ok

## 2016-06-28 NOTE — Telephone Encounter (Signed)
Tyrone Schimke called back from [facility] to follow up if the form was received? Thank you!

## 2016-07-01 NOTE — Telephone Encounter (Signed)
Form received & placed in quick sign folder for signature

## 2016-07-02 ENCOUNTER — Other Ambulatory Visit: Payer: 59

## 2016-07-02 NOTE — Telephone Encounter (Signed)
Form faxed this morning to [facility]

## 2016-07-03 ENCOUNTER — Other Ambulatory Visit (INDEPENDENT_AMBULATORY_CARE_PROVIDER_SITE_OTHER): Payer: 59

## 2016-07-03 DIAGNOSIS — E871 Hypo-osmolality and hyponatremia: Secondary | ICD-10-CM | POA: Diagnosis not present

## 2016-07-03 LAB — SODIUM: Sodium: 138 mEq/L (ref 135–145)

## 2016-07-04 ENCOUNTER — Encounter: Payer: Self-pay | Admitting: Internal Medicine

## 2016-07-10 ENCOUNTER — Ambulatory Visit (INDEPENDENT_AMBULATORY_CARE_PROVIDER_SITE_OTHER): Payer: 59 | Admitting: Family Medicine

## 2016-07-10 ENCOUNTER — Telehealth: Payer: Self-pay | Admitting: *Deleted

## 2016-07-10 ENCOUNTER — Encounter: Payer: Self-pay | Admitting: Family Medicine

## 2016-07-10 VITALS — BP 120/64 | HR 72 | Temp 98.2°F | Wt 232.4 lb

## 2016-07-10 DIAGNOSIS — R600 Localized edema: Secondary | ICD-10-CM | POA: Diagnosis not present

## 2016-07-10 DIAGNOSIS — R197 Diarrhea, unspecified: Secondary | ICD-10-CM

## 2016-07-10 DIAGNOSIS — I872 Venous insufficiency (chronic) (peripheral): Secondary | ICD-10-CM | POA: Diagnosis not present

## 2016-07-10 LAB — COMPREHENSIVE METABOLIC PANEL
ALK PHOS: 62 U/L (ref 39–117)
ALT: 7 U/L (ref 0–35)
AST: 14 U/L (ref 0–37)
Albumin: 3.5 g/dL (ref 3.5–5.2)
BILIRUBIN TOTAL: 0.4 mg/dL (ref 0.2–1.2)
BUN: 4 mg/dL — ABNORMAL LOW (ref 6–23)
CALCIUM: 8.7 mg/dL (ref 8.4–10.5)
CO2: 28 mEq/L (ref 19–32)
Chloride: 102 mEq/L (ref 96–112)
Creatinine, Ser: 0.62 mg/dL (ref 0.40–1.20)
GFR: 104.24 mL/min (ref >60.00)
Glucose, Bld: 88 mg/dL (ref 70–99)
Potassium: 3.8 mEq/L (ref 3.5–5.1)
Sodium: 137 mEq/L (ref 135–145)
TOTAL PROTEIN: 6.6 g/dL (ref 6.0–8.3)

## 2016-07-10 LAB — CBC
HCT: 38 % (ref 36.0–46.0)
Hemoglobin: 12.3 g/dL (ref 12.0–15.0)
MCHC: 32.3 g/dL (ref 30.0–36.0)
MCV: 78.9 fl (ref 78.0–100.0)
PLATELETS: 244 10*3/uL (ref 150.0–400.0)
RBC: 4.81 Mil/uL (ref 3.87–5.11)
RDW: 20.5 % — ABNORMAL HIGH (ref 11.5–15.5)
WBC: 9.4 10*3/uL (ref 4.0–10.5)

## 2016-07-10 LAB — SEDIMENTATION RATE: SED RATE: 51 mm/h — AB (ref 0–30)

## 2016-07-10 LAB — BRAIN NATRIURETIC PEPTIDE: PRO B NATRI PEPTIDE: 54 pg/mL (ref 0.0–100.0)

## 2016-07-10 LAB — TSH: TSH: 2 u[IU]/mL (ref 0.35–4.50)

## 2016-07-10 NOTE — Telephone Encounter (Signed)
Saw Dr Caryl Bis today.  Compression hose were recommended.

## 2016-07-10 NOTE — Progress Notes (Signed)
Pre visit review using our clinic review tool, if applicable. No additional management support is needed unless otherwise documented below in the visit note. 

## 2016-07-10 NOTE — Patient Instructions (Signed)
Nice to see you. Your swelling is likely related to venous insufficiency though we are going to obtain some lab work to rule out other causes. We will also have you start using compression stockings. We will check your stool for C. difficile. If you develop chest pain, shortness of breath, worsening abdominal pain, fevers, or any new or changing symptoms please seek medical attention.

## 2016-07-10 NOTE — Telephone Encounter (Signed)
Spoke with the patient, explained the difference in the two and she will try the ted hose and if they don't work she will get fitted for the compression hose.

## 2016-07-10 NOTE — Telephone Encounter (Signed)
Did you request compression stockings a certain length or can her ted hose work instead?

## 2016-07-10 NOTE — Telephone Encounter (Signed)
Patient received a Rx today for compression stockings. She already has [facility] , she questioned if she could use her ted hose  Pt contact [phone]

## 2016-07-10 NOTE — Assessment & Plan Note (Addendum)
History and exam most consistent with venous stasis insufficiency with venous stasis dermatitis. Doubt DVT given calf diameters are within 1 cm of each other with negative Homans and no risk factors. Unlikely that erythema is cellulitis given the bilateral nature and lack of warmth and induration. Doubt swelling is related to CHF as this patient has no history of CHF and has not had any change in her breathing status though we will check a BNP to rule out this out. We'll additionally check CMP, CBC, and TSH to evaluate for other causes. She is given a prescription for compression stockings and advise to go to [facility] to be fit for these. She will keep her feet elevated as much as possible. She is given return precautions.

## 2016-07-10 NOTE — Assessment & Plan Note (Signed)
Patient with 1.5 weeks of diarrhea. Intermittent left lower quadrant discomfort over the last day with her bowel movements. Benign abdominal exam at this time. Vital signs stable. Has been exposed to C. difficile thus we will check for this. Could be viral illness or other infectious cause. Could be related to diverticulosis or diverticulitis. We will check a CBC and an ESR and if white count is elevated or sedimentation rate elevated would consider treatment for diverticulitis. Given return precautions.

## 2016-07-10 NOTE — Progress Notes (Signed)
Tommi Rumps, MD Phone: [phone]  Kathryn Friedman is a 60 y.o. female who presents today for same-day visit.  Bilateral lower extremity swelling: Patient notes for about the last week her bilateral lower extremities have been swelling left slightly greater than right. Notes she's had some redness in her bilateral lower extremities that has been chronic and is minimally worsened over this time period. Notes no orthopnea. Maybe some PND. She notes shortness of breath at baseline and this is unchanged. No chest pain. No history of blood clot. No recent surgeries. No recent long trips. No estrogen supplementation. Notes she saw vascular surgery several years ago for swelling and they advised her to use Unna boots and she has been doing this recently with little benefit. Notes pain mostly in her feet with the swelling. Will occasionally go down with propping her feet up. Worse as the day goes on. No fevers.  Diarrhea: As been going on for the last 1.5 weeks. Liquid stool. Some minimal left lower quadrant sharp abdominal discomfort with her bowel movements starting yesterday. No blood in her stool. No nausea or vomiting. Notes her stool is liquid. Has a history of diverticulosis though not diverticulitis. Has been using Imodium some.  PMH: Smoker   ROS see history of present illness  Objective  Physical Exam Vitals:   07/10/16 0839  BP: 120/64  Pulse: 72  Temp: 98.2 F (36.8 C)    BP Readings from Last 3 Encounters:  07/10/16 120/64  05/06/16 110/70  03/28/16 112/68   Wt Readings from Last 3 Encounters:  07/10/16 232 lb 6.4 oz (105.4 kg)  05/06/16 228 lb (103.4 kg)  03/28/16 222 lb (100.7 kg)    Physical Exam  Constitutional: No distress.  HENT:  Head: Normocephalic and atraumatic.  Cardiovascular: Normal rate, regular rhythm and normal heart sounds.   Pulmonary/Chest: Effort normal and breath sounds normal.  Abdominal: Soft. Bowel sounds are normal. She exhibits no  distension. There is no tenderness. There is no rebound and no guarding.  Musculoskeletal:  Bilateral lower extremities with 1+ pitting edema, right calf 39.5 cm and left calf 40.5 cm, tenderness of the dorsum of her foot and anterior lower leg and posterior lower leg not confined to deep venous areas, negative Homans bilaterally, venous stasis dermatitis changes bilateral lower extremities from above her ankle medially and anteriorly with no warmth or induration  Neurological: She is alert. Gait normal.  Skin: Skin is warm and dry. She is not diaphoretic.     Assessment/Plan: Please see individual problem list.  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 Doubt DVT given calf diameters are within 1 cm of each other with negative Homans and no risk factors. Unlikely that erythema is cellulitis given the bilateral nature and lack of warmth and induration. Doubt swelling is related to CHF as this patient has no history of CHF and has not had any change in her breathing status though we will check a BNP to rule out this out. We'll additionally check CMP, CBC, and TSH to evaluate for other causes. She is given a prescription for compression stockings and advise to go to [place] to be fit for these. She will keep her feet elevated as much as possible. She is given return precautions.  Diarrhea Patient with 1.5 weeks of diarrhea. Intermittent left lower quadrant discomfort over the last day with her bowel movements. Benign abdominal exam at this time. Vital  signs stable. Has been exposed to C. difficile thus we will check for this. Could be viral illness or other infectious cause. Could be related to diverticulosis or diverticulitis. We will check a CBC and an ESR and if white count is elevated or sedimentation rate elevated would consider treatment for diverticulitis. Given return  precautions.   Orders Placed This Encounter  Procedures  . Stool C-Diff Toxin Assay  . Comp Met (CMET)  . CBC  . TSH  . B Nat Peptide  . Sed Rate (ESR)    Tommi Rumps, MD [place]

## 2016-07-11 ENCOUNTER — Other Ambulatory Visit: Payer: Self-pay | Admitting: Family Medicine

## 2016-07-11 ENCOUNTER — Telehealth: Payer: Self-pay | Admitting: Internal Medicine

## 2016-07-11 MED ORDER — AMOXICILLIN-POT CLAVULANATE 875-125 MG PO TABS: 1.0000 | ORAL_TABLET | Freq: Two times a day (BID) | ORAL | 0 refills | Status: DC

## 2016-07-11 NOTE — Telephone Encounter (Signed)
Notified patient of results 

## 2016-07-11 NOTE — Telephone Encounter (Signed)
Pt called stating she was returning Dr Caryl Bis call.   Message in chart reads below:   Notes Recorded by Leone Haven, MD on 07/10/2016 at 5:39 PM EDT Attempted to call patient with results. Left message asking to call back to the office. Please attempt to call the patient in the morning with the results. Thanks.  Pt states if msg can be left she has to start work. Thank you!

## 2016-07-12 ENCOUNTER — Other Ambulatory Visit: Payer: Self-pay | Admitting: Family Medicine

## 2016-07-12 ENCOUNTER — Telehealth: Payer: Self-pay | Admitting: Internal Medicine

## 2016-07-12 ENCOUNTER — Other Ambulatory Visit: Payer: 59

## 2016-07-12 NOTE — Telephone Encounter (Signed)
Please advise 

## 2016-07-12 NOTE — Telephone Encounter (Signed)
Pt called to follow up on the antibiotic that was sent in on 07/11/16.  Pt states it's not at the pharmacy. Pt also ask if something can be called in less expensive.   Pharmacy is [facility] [place] [facility] [address]  Call pt @ [phone]. Leave msg. Thank you!

## 2016-07-13 LAB — C. DIFFICILE GDH AND TOXIN A/B
C. DIFF TOXIN A/B: NOT DETECTED
C. DIFFICILE GDH: NOT DETECTED

## 2016-07-15 ENCOUNTER — Telehealth: Payer: Self-pay | Admitting: Internal Medicine

## 2016-07-15 ENCOUNTER — Other Ambulatory Visit: Payer: Self-pay | Admitting: Internal Medicine

## 2016-07-15 NOTE — Telephone Encounter (Signed)
Spoke with the patient she is going to call [facility]. thanks

## 2016-07-15 NOTE — Telephone Encounter (Signed)
Left a VM to return my call. thanks 

## 2016-07-15 NOTE — Telephone Encounter (Signed)
I have been out of town and did not get this message on Friday.  In reviewing the chart, she saw Dr Caryl Bis and then f/u phone message - he sent in rx for augmentin.   It appears the prescription was sent to [facility] - medication management.  Please notify pt.  As far as cost, this is a generic medication.  I will not be in the office today or tomorrow.

## 2016-07-15 NOTE — Telephone Encounter (Signed)
[name] returned Kathryn Friedman Roman's call but Kathryn Friedman couldn't be reached. She'd like a return phone and has to be at work by [employer] .   Pt's ph# [phone] Thank you.

## 2016-07-15 NOTE — Telephone Encounter (Signed)
Patient stated that Dr. Nicki Reaper increased dosing of Metformin and needs a new prescription sent to pharmacy. Patient is not out of medications.

## 2016-07-15 NOTE — Telephone Encounter (Signed)
Pt called stating that the increase of her metFORMIN (GLUCOPHAGE) 500 MG tablet needs to be sent to medication management. Thank you!

## 2016-07-16 NOTE — Telephone Encounter (Signed)
In reviewing chart, rx sent in to pharmacy 07/15/16

## 2016-07-19 ENCOUNTER — Encounter: Payer: Self-pay | Admitting: Internal Medicine

## 2016-07-19 NOTE — Telephone Encounter (Signed)
Labs sent to behavioral health as requested but patient continuing to have diarrhea.

## 2016-07-19 NOTE — Telephone Encounter (Signed)
Please call pt and notify her that if she is having persistent diarrhea despite immodium, she needs to be reevaluated.  Would recommend going ahead and be seen and confirm nothing more acute going on.  Acute care today.  Ok to send labs.

## 2016-07-26 ENCOUNTER — Encounter: Payer: Self-pay | Admitting: Internal Medicine

## 2016-07-27 MED ORDER — GLUCOSE BLOOD VI STRP: ORAL_STRIP | 3 refills | Status: DC

## 2016-07-27 NOTE — Telephone Encounter (Signed)
rx sent in for test strips (one Touch Ultra Test Strips).

## 2016-07-30 ENCOUNTER — Other Ambulatory Visit: Payer: Self-pay | Admitting: Internal Medicine

## 2016-07-30 MED ORDER — METFORMIN HCL ER 500 MG PO TB24: ORAL_TABLET | ORAL | 1 refills | Status: DC

## 2016-08-06 ENCOUNTER — Encounter: Payer: Self-pay | Admitting: Internal Medicine

## 2016-08-06 ENCOUNTER — Ambulatory Visit (INDEPENDENT_AMBULATORY_CARE_PROVIDER_SITE_OTHER): Payer: 59 | Admitting: Internal Medicine

## 2016-08-06 VITALS — BP 120/62 | HR 76 | Temp 98.6°F | Ht 64.0 in | Wt 234.6 lb

## 2016-08-06 DIAGNOSIS — J449 Chronic obstructive pulmonary disease, unspecified: Secondary | ICD-10-CM

## 2016-08-06 DIAGNOSIS — R1900 Intra-abdominal and pelvic swelling, mass and lump, unspecified site: Secondary | ICD-10-CM

## 2016-08-06 DIAGNOSIS — R197 Diarrhea, unspecified: Secondary | ICD-10-CM | POA: Diagnosis not present

## 2016-08-06 DIAGNOSIS — G4733 Obstructive sleep apnea (adult) (pediatric): Secondary | ICD-10-CM

## 2016-08-06 DIAGNOSIS — F32A Depression, unspecified: Secondary | ICD-10-CM

## 2016-08-06 DIAGNOSIS — E7800 Pure hypercholesterolemia, unspecified: Secondary | ICD-10-CM

## 2016-08-06 DIAGNOSIS — K219 Gastro-esophageal reflux disease without esophagitis: Secondary | ICD-10-CM

## 2016-08-06 DIAGNOSIS — I2510 Atherosclerotic heart disease of native coronary artery without angina pectoris: Secondary | ICD-10-CM

## 2016-08-06 DIAGNOSIS — Z0000 Encounter for general adult medical examination without abnormal findings: Secondary | ICD-10-CM

## 2016-08-06 DIAGNOSIS — Z23 Encounter for immunization: Secondary | ICD-10-CM

## 2016-08-06 DIAGNOSIS — I10 Essential (primary) hypertension: Secondary | ICD-10-CM

## 2016-08-06 DIAGNOSIS — E119 Type 2 diabetes mellitus without complications: Secondary | ICD-10-CM

## 2016-08-06 DIAGNOSIS — F329 Major depressive disorder, single episode, unspecified: Secondary | ICD-10-CM

## 2016-08-06 DIAGNOSIS — D649 Anemia, unspecified: Secondary | ICD-10-CM

## 2016-08-06 DIAGNOSIS — D509 Iron deficiency anemia, unspecified: Secondary | ICD-10-CM

## 2016-08-06 DIAGNOSIS — Z720 Tobacco use: Secondary | ICD-10-CM

## 2016-08-06 MED ORDER — PROPRANOLOL HCL 20 MG PO TABS: ORAL_TABLET | ORAL | 1 refills | Status: DC

## 2016-08-06 MED ORDER — CITALOPRAM HYDROBROMIDE 40 MG PO TABS: 40.0000 mg | ORAL_TABLET | Freq: Every day | ORAL | 1 refills | Status: DC

## 2016-08-06 MED ORDER — ATORVASTATIN CALCIUM 40 MG PO TABS: 40.0000 mg | ORAL_TABLET | Freq: Every day | ORAL | 3 refills | Status: DC

## 2016-08-06 NOTE — Progress Notes (Signed)
Pre visit review using our clinic review tool, if applicable. No additional management support is needed unless otherwise documented below in the visit note. 

## 2016-08-06 NOTE — Assessment & Plan Note (Signed)
Physical today 08/06/16.  Mammogram 06/17/16 - birads I.  Colonoscopy 11/2013- diverticulosis.

## 2016-08-06 NOTE — Patient Instructions (Signed)
Probiotics:    culturelle Curator

## 2016-08-06 NOTE — Progress Notes (Signed)
Patient ID: Kathryn Friedman, female   DOB: 04/29/1956, 60 y.o.   MRN: [id number]   Subjective:    Patient ID: Kathryn Friedman, female    DOB: Mar 05, 1956, 60 y.o.   MRN: [id number]  HPI  Patient here for her physical exam.  She reports she is continuing to have watery stools.  May have 5-6 per day.  Increased gas after eating.  No blood.  Intermittent abdominal pain and cramping.  No urine change.  Discussed metformin and side effects.  Discussed changing to extended release metformin.  She plans to start this today or tomorrow.  Is eating.  No chest pain.  Breathing stable.  Continues to smoke.  Discussed screening CT.  States am sugars averaging low 100s and pm sugars < 160.     Past Medical History:  Diagnosis Date  . Anemia   . Arthritis    back and knees  . Asthma   . COPD (chronic obstructive pulmonary disease) ([place])   . Coronary artery disease    s/p stent placement 06/25/06  . Depression    secondary to the death of her husband (died 55)  . Diabetes mellitus without complication ([place]) Diagnosed 05/2008  . Diverticulitis   . GERD (gastroesophageal reflux disease)   . Hypertension   . Hypertriglyceridemia   . Sleep apnea    on CPAP  . Spastic colon    Past Surgical History:  Procedure Laterality Date  . ABDOMINAL HYSTERECTOMY  with left ovary in place 1996  . APPENDECTOMY  1985  . CESAREAN SECTION  1984  . CHOLECYSTECTOMY  1985  . KNEE ARTHROSCOPY  Arthroscopic left knee surgery   . KNEE SURGERY  status post knee surgey   . REPLACEMENT TOTAL KNEE  ([facility])  . SHOULDER SURGERY  shoulder operation secondary to a torn tendon   Family History  Problem Relation Age of Onset  . Other Mother     Hit by a fire truck and has had multiple operations on her back , and has history of MVP   . Mitral valve prolapse Mother   . Heart disease Father     myocardial infarction and is status post bypass surgery  . Mitral valve prolapse Sister   . Hepatitis C Brother   . Cirrhosis  Brother   . Colon cancer Paternal Aunt    Social History   Social History  . Marital status: Widowed    Spouse name: N/A  . Number of children: 1  . Years of education: N/A   Occupational History  .  [employer]   Social History Main Topics  . Smoking status: Current Every Day Smoker    Packs/day: 1.50    Types: Cigarettes  . Smokeless tobacco: Never Used  . Alcohol use No  . Drug use: No  . Sexual activity: Not Asked   Other Topics Concern  . None   Social History Narrative  . None    Outpatient Encounter Prescriptions as of 08/06/2016  Medication Sig  . acetaminophen (TYLENOL) 500 MG tablet Take 500 mg by mouth every 6 (six) hours as needed for pain.  Marland Kitchen albuterol (VENTOLIN HFA) 108 (90 BASE) MCG/ACT inhaler Inhale 2 puffs into the lungs every 4 (four) hours as needed.  Marland Kitchen amLODipine (NORVASC) 5 MG tablet Take 1 tablet (5 mg total) by mouth daily.  Marland Kitchen aspirin 81 MG tablet Take 81 mg by mouth daily. Take 1 tablet by mouth once a day as directed  . atorvastatin (LIPITOR) 40  MG tablet Take 1 tablet (40 mg total) by mouth daily.  . Calcium Carbonate-Vitamin D (CALCIUM 600 + D PO) Take 600 mg by mouth daily.  . citalopram (CELEXA) 40 MG tablet Take 1 tablet (40 mg total) by mouth daily.  . clopidogrel (PLAVIX) 75 MG tablet Take 1 tablet (75 mg total) by mouth daily.  . cyclobenzaprine (FLEXERIL) 10 MG tablet TAKE ONE TABLET BY MOUTH 3 TIMES A DAY AS NEEDED FOR MUSCLE SPASMS  . Fluticasone-Salmeterol (ADVAIR) 250-50 MCG/DOSE AEPB Inhale 1 puff into the lungs every 12 (twelve) hours.  Marland Kitchen glucose blood test strip One Touch Ultra Test Strips. Check blood sugar bid  . metFORMIN (GLUCOPHAGE XR) 500 MG 24 hr tablet Take two tablets bid  . naproxen (NAPROSYN) 500 MG tablet Take 1 tablet (500 mg total) by mouth 2 (two) times daily with a meal.  . oxyCODONE-acetaminophen (ROXICET) 5-325 MG tablet Take 1 tablet by mouth every 4 (four) hours as needed for severe pain.  . pantoprazole (PROTONIX) 40  MG tablet Take 1 tablet (40 mg total) by mouth daily.  . propranolol (INDERAL) 20 MG tablet TAKE TWO TABLETS BY MOUTH 2 TIMES A DAY.  . traZODone (DESYREL) 100 MG tablet Take 100 mg by mouth at bedtime.  . [DISCONTINUED] amoxicillin-clavulanate (AUGMENTIN) 875-125 MG tablet Take 1 tablet by mouth 2 (two) times daily.  . [DISCONTINUED] atorvastatin (LIPITOR) 40 MG tablet Take 40 mg by mouth daily.  . [DISCONTINUED] citalopram (CELEXA) 40 MG tablet Take 1 tablet (40 mg total) by mouth daily.  . [DISCONTINUED] propranolol (INDERAL) 20 MG tablet TAKE TWO TABLETS BY MOUTH 2 TIMES A DAY.   No facility-administered encounter medications on file as of 08/06/2016.     Review of Systems  Constitutional: Negative for appetite change and unexpected weight change.  HENT: Negative for congestion and sinus pressure.   Respiratory: Negative for cough, chest tightness and shortness of breath.   Cardiovascular: Negative for chest pain, palpitations and leg swelling.  Gastrointestinal: Positive for diarrhea. Negative for blood in stool, nausea and vomiting.       Abdominal cramping and discomfort as outlined.    Genitourinary: Negative for difficulty urinating and dysuria.  Musculoskeletal: Negative for joint swelling and myalgias.  Skin: Negative for color change and rash.  Neurological: Negative for dizziness and headaches.  Psychiatric/Behavioral: Negative for agitation and dysphoric mood.       Increased stress with her current bowel issues.         Objective:    Physical Exam  Constitutional: She appears well-developed and well-nourished. No distress.  HENT:  Nose: Nose normal.  Mouth/Throat: Oropharynx is clear and moist.  Neck: Neck supple. No thyromegaly present.  Cardiovascular: Normal rate and regular rhythm.   Pulmonary/Chest: Breath sounds normal. No respiratory distress. She has no wheezes.  Abdominal: Soft. Bowel sounds are normal. There is no tenderness.  Musculoskeletal: She  exhibits no edema or tenderness.  Lymphadenopathy:    She has no cervical adenopathy.  Skin: No rash noted. No erythema.  Psychiatric: She has a normal mood and affect. Her behavior is normal.    BP 120/62   Pulse 76   Temp 98.6 F (37 C) (Oral)   Ht 5\' 4"  (1.626 m)   Wt 234 lb 9.6 oz (106.4 kg)   SpO2 91%   BMI 40.27 kg/m  Wt Readings from Last 3 Encounters:  08/06/16 234 lb 9.6 oz (106.4 kg)  07/10/16 232 lb 6.4 oz (105.4 kg)  05/06/16 228  lb (103.4 kg)     Lab Results  Component Value Date   WBC 9.4 07/10/2016   HGB 12.3 07/10/2016   HCT 38.0 07/10/2016   PLT 244.0 07/10/2016   GLUCOSE 88 07/10/2016   CHOL 178 06/14/2016   TRIG 166.0 (H) 06/14/2016   HDL 39.00 (L) 06/14/2016   LDLCALC 105 (H) 06/14/2016   ALT 7 07/10/2016   AST 14 07/10/2016   NA 137 07/10/2016   K 3.8 07/10/2016   CL 102 07/10/2016   CREATININE 0.62 07/10/2016   BUN 4 (L) 07/10/2016   CO2 28 07/10/2016   TSH 2.00 07/10/2016   HGBA1C 7.6 (H) 06/14/2016   MICROALBUR <0.7 06/14/2016    Mm Screening Breast Tomo Bilateral  Result Date: 06/17/2016 CLINICAL DATA:  Screening. EXAM: 2D DIGITAL SCREENING BILATERAL MAMMOGRAM WITH CAD AND ADJUNCT TOMO COMPARISON:  Previous exam(s). ACR Breast Density Category b: There are scattered areas of fibroglandular density. FINDINGS: There are no findings suspicious for malignancy. Images were processed with CAD. IMPRESSION: No mammographic evidence of malignancy. A result letter of this screening mammogram Friedman be mailed directly to the patient. RECOMMENDATION: Screening mammogram in one year. (Code:SM-B-01Y) BI-RADS CATEGORY  1: Negative. Electronically Signed   By: Marin Olp M.D.   On: 06/17/2016 10:02      Assessment & Plan:   Problem List Items Addressed This Visit    Anemia    Had colonoscopy and EGD.  Follow cbc.       CAD (coronary artery disease)    S/p stent placement.  Has seen cardiology.  On plavix.  Currently asymptomatic.  Continue risk  factor modification.        Relevant Medications   atorvastatin (LIPITOR) 40 MG tablet   propranolol (INDERAL) 20 MG tablet   COPD (chronic obstructive pulmonary disease) ([facility])    Previously saw Dr Raul Del.  Continue advair and ventolin.  Breathing stable.        Depression    On citalopram.  Continue follow up with therapist.        Relevant Medications   traZODone (DESYREL) 100 MG tablet   citalopram (CELEXA) 40 MG tablet   Diabetes ([place])    Sugars as outlined.  Discussed diet and exercise.  Change to extended release metformin.  Follow sugars.  Follow met b and a1c.        Relevant Medications   atorvastatin (LIPITOR) 40 MG tablet   Other Relevant Orders   Hemoglobin J0K   Basic metabolic panel   Diarrhea - Primary    Persistent loose stool/diarrhea.  Discussed obtaining CT scan, especially given persistence and associated abdominal discomfort.  She declines at this time.  Change metformin to extended release.  Obtain stool studies.        Relevant Orders   Gastrointestinal Pathogen Panel PCR   Essential hypertension, benign    Blood pressure under good control.  Continue same medication regimen.  Follow pressures.  Follow metabolic panel.        Relevant Medications   atorvastatin (LIPITOR) 40 MG tablet   propranolol (INDERAL) 20 MG tablet   GERD (gastroesophageal reflux disease)    Upper symptoms controlled on protonix.  Follow.        Healthcare maintenance    Physical today 08/06/16.  Mammogram 06/17/16 - birads I.  Colonoscopy 11/2013- diverticulosis.        Hypercholesterolemia    On lipitor.  Low cholesterol diet and exercise.  Follow lipid panel and liver function tests.  Relevant Medications   atorvastatin (LIPITOR) 40 MG tablet   propranolol (INDERAL) 20 MG tablet   Other Relevant Orders   Lipid panel   Hepatic function panel   Iron deficiency anemia    Has seen GI.  Follow cbc.       Lump in the abdomen    Some fullness on exam.  Appears  to not be as prominent when lying down.  Discussed scanning.  She wants to hold at this time.  Follow.       Obstructive sleep apnea    CPAP.       Tobacco use    Continues to smoke.  Discussed with her today regarding the need to stop smoking.  She desires to continue.  Discussed screening CT scan.  Friedman arrange.         Other Visit Diagnoses    Encounter for immunization       Relevant Orders   Flu Vaccine QUAD 36+ mos IM (Completed)       Einar Pheasant, MD

## 2016-08-08 ENCOUNTER — Encounter: Payer: Self-pay | Admitting: Internal Medicine

## 2016-08-08 DIAGNOSIS — R1084 Generalized abdominal pain: Secondary | ICD-10-CM

## 2016-08-08 DIAGNOSIS — R197 Diarrhea, unspecified: Secondary | ICD-10-CM

## 2016-08-09 ENCOUNTER — Encounter: Payer: Self-pay | Admitting: Internal Medicine

## 2016-08-11 ENCOUNTER — Encounter: Payer: Self-pay | Admitting: Internal Medicine

## 2016-08-11 DIAGNOSIS — Z87891 Personal history of nicotine dependence: Secondary | ICD-10-CM | POA: Insufficient documentation

## 2016-08-11 DIAGNOSIS — F17200 Nicotine dependence, unspecified, uncomplicated: Secondary | ICD-10-CM | POA: Insufficient documentation

## 2016-08-11 NOTE — Assessment & Plan Note (Signed)
Blood pressure under good control.  Continue same medication regimen.  Follow pressures.  Follow metabolic panel.   

## 2016-08-11 NOTE — Assessment & Plan Note (Signed)
Continues to smoke.  Discussed with her today regarding the need to stop smoking.  She desires to continue.  Discussed screening CT scan.  Will arrange.

## 2016-08-11 NOTE — Assessment & Plan Note (Signed)
Sugars as outlined.  Discussed diet and exercise.  Change to extended release metformin.  Follow sugars.  Follow met b and a1c.   

## 2016-08-11 NOTE — Telephone Encounter (Signed)
Order placed for CT scan.  Note sent for pt to return for kidney function tests.  Need creatinine prior to her scan.  She will also need to stop metformin 24 hours before CT and 48 hours after.  She is going to return stool sample this week.  When you contact her with CT scan appt, can you schedule a lab appt time for her to get met b.  Thanks.

## 2016-08-11 NOTE — Assessment & Plan Note (Signed)
Had colonoscopy and EGD.  Follow cbc.

## 2016-08-11 NOTE — Assessment & Plan Note (Signed)
CPAP.  

## 2016-08-11 NOTE — Assessment & Plan Note (Signed)
Persistent loose stool/diarrhea.  Discussed obtaining CT scan, especially given persistence and associated abdominal discomfort.  She declines at this time.  Change metformin to extended release.  Obtain stool studies.

## 2016-08-11 NOTE — Assessment & Plan Note (Signed)
S/p stent placement.  Has seen cardiology.  On plavix.  Currently asymptomatic.  Continue risk factor modification.

## 2016-08-11 NOTE — Assessment & Plan Note (Signed)
On citalopram.  Continue follow up with therapist.

## 2016-08-11 NOTE — Assessment & Plan Note (Signed)
Previously saw Dr Raul Del.  Continue advair and ventolin.  Breathing stable.

## 2016-08-11 NOTE — Assessment & Plan Note (Signed)
On lipitor.  Low cholesterol diet and exercise.  Follow lipid panel and liver function tests.   

## 2016-08-11 NOTE — Assessment & Plan Note (Signed)
Upper symptoms controlled on protonix.  Follow.  

## 2016-08-11 NOTE — Assessment & Plan Note (Signed)
Some fullness on exam.  Appears to not be as prominent when lying down.  Discussed scanning.  She wants to hold at this time.  Follow.

## 2016-08-11 NOTE — Assessment & Plan Note (Signed)
Has seen GI.  Follow cbc.  

## 2016-08-12 ENCOUNTER — Telehealth: Payer: Self-pay | Admitting: Internal Medicine

## 2016-08-12 ENCOUNTER — Other Ambulatory Visit: Payer: 59

## 2016-08-12 NOTE — Telephone Encounter (Signed)
Pt dropped off FMLA papers to be completed by Dr. Nicki Reaper. Papers are in front office in colored folder.

## 2016-08-13 ENCOUNTER — Inpatient Hospital Stay: Payer: 59 | Attending: Oncology | Admitting: Oncology

## 2016-08-13 ENCOUNTER — Telehealth: Payer: Self-pay | Admitting: *Deleted

## 2016-08-13 ENCOUNTER — Encounter: Payer: Self-pay | Admitting: *Deleted

## 2016-08-13 DIAGNOSIS — Z122 Encounter for screening for malignant neoplasm of respiratory organs: Secondary | ICD-10-CM | POA: Diagnosis not present

## 2016-08-13 DIAGNOSIS — Z87891 Personal history of nicotine dependence: Secondary | ICD-10-CM | POA: Diagnosis not present

## 2016-08-13 NOTE — Telephone Encounter (Signed)
Received referral for low dose lung cancer screening CT scan. Voicemail left at phone number listed in EMR for patient to call me back to facilitate scheduling scan.  

## 2016-08-13 NOTE — Telephone Encounter (Signed)
Received referral for initial lung cancer screening scan. Contacted patient and obtained smoking history,(current, 67.5pack year ) as well as answering questions related to screening process. Patient denies signs of lung cancer such as weight loss or hemoptysis. Patient denies comorbidity that would prevent curative treatment if lung cancer were found. Patient is tentatively scheduled for shared decision making visit and CT scan on 08/13/16, pending insurance approval from business office.

## 2016-08-13 NOTE — Progress Notes (Signed)
In accordance with CMS guidelines, patient has met eligibility criteria including age, absence of signs or symptoms of lung cancer.  Social History  Substance Use Topics  . Smoking status: Current Every Day Smoker    Packs/day: 1.50    Years: 45.00    Types: Cigarettes  . Smokeless tobacco: Never Used  . Alcohol use No     A shared decision-making session was conducted prior to the performance of CT scan. This includes one or more decision aids, includes benefits and harms of screening, follow-up diagnostic testing, over-diagnosis, false positive rate, and total radiation exposure.  Counseling on the importance of adherence to annual lung cancer LDCT screening, impact of co-morbidities, and ability or willingness to undergo diagnosis and treatment is imperative for compliance of the program.  Counseling on the importance of continued smoking cessation for former smokers; the importance of smoking cessation for current smokers, and information about tobacco cessation interventions have been given to patient including [place] and [employer] quit [place] programs.  Written order for lung cancer screening with LDCT has been given to the patient and any and all questions have been answered to the best of my abilities.   Yearly follow up will be coordinated by Burgess Estelle, Thoracic Navigator.

## 2016-08-14 LAB — GASTROINTESTINAL PATHOGEN PANEL PCR
C. difficile Tox A/B, PCR: NOT DETECTED
CAMPYLOBACTER, PCR: NOT DETECTED
Cryptosporidium, PCR: NOT DETECTED
E COLI (ETEC) LT/ST, PCR: NOT DETECTED
E COLI 0157, PCR: NOT DETECTED
E coli (STEC) stx1/stx2, PCR: NOT DETECTED
Giardia lamblia, PCR: NOT DETECTED
NOROVIRUS, PCR: NOT DETECTED
Rotavirus A, PCR: NOT DETECTED
SALMONELLA, PCR: NOT DETECTED
SHIGELLA, PCR: NOT DETECTED

## 2016-08-15 ENCOUNTER — Other Ambulatory Visit: Payer: Self-pay | Admitting: *Deleted

## 2016-08-15 ENCOUNTER — Encounter: Payer: Self-pay | Admitting: Internal Medicine

## 2016-08-15 DIAGNOSIS — Z87891 Personal history of nicotine dependence: Secondary | ICD-10-CM

## 2016-08-16 ENCOUNTER — Telehealth: Payer: Self-pay | Admitting: *Deleted

## 2016-08-16 ENCOUNTER — Encounter: Payer: Self-pay | Admitting: Internal Medicine

## 2016-08-16 ENCOUNTER — Ambulatory Visit
Admission: RE | Admit: 2016-08-16 | Discharge: 2016-08-16 | Disposition: A | Discharge status: Home / Self Care | Payer: 59 | Source: Ambulatory Visit | Attending: Internal Medicine | Admitting: Internal Medicine

## 2016-08-16 DIAGNOSIS — R935 Abnormal findings on diagnostic imaging of other abdominal regions, including retroperitoneum: Secondary | ICD-10-CM | POA: Diagnosis not present

## 2016-08-16 DIAGNOSIS — R197 Diarrhea, unspecified: Secondary | ICD-10-CM

## 2016-08-16 DIAGNOSIS — R590 Localized enlarged lymph nodes: Secondary | ICD-10-CM | POA: Diagnosis not present

## 2016-08-16 DIAGNOSIS — I2510 Atherosclerotic heart disease of native coronary artery without angina pectoris: Secondary | ICD-10-CM | POA: Insufficient documentation

## 2016-08-16 DIAGNOSIS — K5730 Diverticulosis of large intestine without perforation or abscess without bleeding: Secondary | ICD-10-CM | POA: Insufficient documentation

## 2016-08-16 DIAGNOSIS — K760 Fatty (change of) liver, not elsewhere classified: Secondary | ICD-10-CM | POA: Diagnosis not present

## 2016-08-16 DIAGNOSIS — Z87891 Personal history of nicotine dependence: Secondary | ICD-10-CM

## 2016-08-16 DIAGNOSIS — I700 Atherosclerosis of aorta: Secondary | ICD-10-CM | POA: Insufficient documentation

## 2016-08-16 DIAGNOSIS — R1084 Generalized abdominal pain: Secondary | ICD-10-CM | POA: Diagnosis present

## 2016-08-16 DIAGNOSIS — J984 Other disorders of lung: Secondary | ICD-10-CM | POA: Diagnosis not present

## 2016-08-16 DIAGNOSIS — I288 Other diseases of pulmonary vessels: Secondary | ICD-10-CM | POA: Insufficient documentation

## 2016-08-16 MED ORDER — IOPAMIDOL (ISOVUE-300) INJECTION 61%
100.0000 mL | Freq: Once | INTRAVENOUS | Status: AC | PRN
  Administered 2016-08-16: 100 mL via INTRAVENOUS

## 2016-08-16 NOTE — Telephone Encounter (Signed)
Notified patient of LDCT lung cancer screening results with recommendation for 12 month follow up imaging. Also notified of incidental finding noted below. Will review thoracic adenopathy in tumor conference and arrange for appropriate follow up. Encouraged patient to discuss other incidental findings with PCP to assess for medication changes or further evaluation needed. Patient verbalizes understanding.   IMPRESSION: 1. Lung-RADS Category 1S, negative. Continue annual screening with low-dose chest CT without contrast in 12 months. 2. The "S" modifier represents a potentially clinically significant non pulmonary finding. Mild thoracic adenopathy of indeterminate etiology. Consider follow-up with diagnostic chest CT 3-6 months. 3. Age advanced coronary artery atherosclerosis. Recommend assessment of coronary risk factors and consideration of medical therapy. 4. Mosaic attenuation, favored to be related to mosaic perfusion. This could relate to air trapping or vascular phenomenon such as chronic pulmonary embolism. 5. Pulmonary artery enlargement suggests pulmonary arterial hypertension. 6. Hepatic steatosis.

## 2016-08-16 NOTE — Telephone Encounter (Signed)
See phone note.  Pt called with results.  Agreeable for GI referral.  Order placed for referral.

## 2016-08-22 ENCOUNTER — Encounter: Payer: Self-pay | Admitting: *Deleted

## 2016-08-22 NOTE — Progress Notes (Signed)
Patient was discussed in multidisiplinary conference with recommendation for 3 month follow up imaging with noncontrast CT. There is consideration of reactive adenopathy from CHF and other issues of edema vs. Interstitial Lung Disease. If clinically ILD is questioned, CT scan can be ordered "ILD protocol" upon follow up.

## 2016-08-22 NOTE — Progress Notes (Signed)
Thanks.  Are you going to schedule this and follow, or would you like for me to order? Just let me know.    Thank you.  Kathryn Friedman

## 2016-08-26 ENCOUNTER — Encounter: Payer: Self-pay | Admitting: Internal Medicine

## 2016-08-27 NOTE — Telephone Encounter (Signed)
Message addressed in pts second my chart message today.  Recommended remaining on extended release metformin.  See other message from today.

## 2016-08-28 ENCOUNTER — Telehealth: Payer: Self-pay | Admitting: Internal Medicine

## 2016-08-28 ENCOUNTER — Encounter: Payer: Self-pay | Admitting: Internal Medicine

## 2016-08-28 NOTE — Telephone Encounter (Signed)
Have you received these?

## 2016-08-28 NOTE — Telephone Encounter (Signed)
Forms faxed and patient notified

## 2016-08-28 NOTE — Telephone Encounter (Signed)
Pt called asking if we have received and faxed over her FMLA papers. Pt stated that she dropped them off last month and that they need to be faxed over ASAP.  Thank you!  Call pt @ [phone]

## 2016-08-28 NOTE — Telephone Encounter (Signed)
Paperwork completed and placed in your box.  Can forward.  Please also make a copy for our chart.  Thanks

## 2016-08-29 ENCOUNTER — Telehealth: Payer: Self-pay | Admitting: Internal Medicine

## 2016-08-29 NOTE — Telephone Encounter (Signed)
Patient notified it has been emailed

## 2016-08-29 NOTE — Telephone Encounter (Signed)
Pt requested to have FMLA paper emailed : [email or web address]  Please call pt at [phone]

## 2016-08-29 NOTE — Telephone Encounter (Signed)
Left message to return call 

## 2016-08-29 NOTE — Telephone Encounter (Signed)
Pt called and had a question regarding her FMLA papers. Thank you!  Call pt @ [phone]

## 2016-08-30 ENCOUNTER — Encounter: Payer: Self-pay | Admitting: Internal Medicine

## 2016-08-30 NOTE — Telephone Encounter (Signed)
Patient states the [employer] states that the form was incorrectly filled out Part A question 2 it states is the employee able to perform any of his or her job duties during the time she or he is off due to the condition it was checked no and they state it needs to be yes. Also need to have initial and date on changes.Part C question 3 needs dates.

## 2016-08-30 NOTE — Telephone Encounter (Signed)
Patient notified

## 2016-08-30 NOTE — Telephone Encounter (Signed)
Pt called back returning your call. Thank you! °

## 2016-08-30 NOTE — Telephone Encounter (Signed)
Changes made.  Form placed back in your basket.  Once faxed, keep copy.  Do not send to scan yet, until we can make sure is correct.

## 2016-08-31 NOTE — Telephone Encounter (Signed)
Already addressed.  Form completed and faxed.

## 2016-09-10 ENCOUNTER — Telehealth: Payer: Self-pay | Admitting: *Deleted

## 2016-09-10 NOTE — Telephone Encounter (Signed)
[facility] requested a update on the pt's clearance risk assessment form in reference to pt's colonoscopy, the test is scheduled for 09/13/16 Contact Debbie [phone] Fax [phone]

## 2016-09-10 NOTE — Telephone Encounter (Signed)
Please advise 

## 2016-09-11 NOTE — Telephone Encounter (Signed)
Spoke to pt.  She is aware of risk of stopping plavix.  She is understanding of the risk and willing to proceed with planned procedure.  She is having no acute symptoms. No chest pain.  No sob.  No cough. No congestion.  No fever.  Form completed.  [place] notified.

## 2016-09-11 NOTE — Telephone Encounter (Signed)
Pt has been notified to hold plavix.  Tried to call pt (09/10/16).  Left message.  Will continue to try to reach her.

## 2016-09-11 NOTE — Telephone Encounter (Signed)
Pt called back and can be reached on [phone]. Per note from Dr Nicki Reaper.  Thank you!

## 2016-09-11 NOTE — Telephone Encounter (Signed)
fyi

## 2016-09-13 ENCOUNTER — Encounter
Admission: RE | Disposition: A | Discharge status: Home / Self Care | Payer: Self-pay | Source: Ambulatory Visit | Attending: Unknown Physician Specialty

## 2016-09-13 ENCOUNTER — Ambulatory Visit: Payer: 59 | Admitting: Anesthesiology

## 2016-09-13 ENCOUNTER — Ambulatory Visit
Admission: RE | Admit: 2016-09-13 | Discharge: 2016-09-13 | Disposition: A | Discharge status: Home / Self Care | Payer: 59 | Source: Ambulatory Visit | Attending: Unknown Physician Specialty | Admitting: Unknown Physician Specialty

## 2016-09-13 ENCOUNTER — Encounter: Payer: Self-pay | Admitting: *Deleted

## 2016-09-13 DIAGNOSIS — K5730 Diverticulosis of large intestine without perforation or abscess without bleeding: Secondary | ICD-10-CM | POA: Insufficient documentation

## 2016-09-13 DIAGNOSIS — I2510 Atherosclerotic heart disease of native coronary artery without angina pectoris: Secondary | ICD-10-CM | POA: Insufficient documentation

## 2016-09-13 DIAGNOSIS — F17210 Nicotine dependence, cigarettes, uncomplicated: Secondary | ICD-10-CM | POA: Diagnosis not present

## 2016-09-13 DIAGNOSIS — D122 Benign neoplasm of ascending colon: Secondary | ICD-10-CM | POA: Diagnosis not present

## 2016-09-13 DIAGNOSIS — K640 First degree hemorrhoids: Secondary | ICD-10-CM | POA: Insufficient documentation

## 2016-09-13 DIAGNOSIS — K219 Gastro-esophageal reflux disease without esophagitis: Secondary | ICD-10-CM | POA: Insufficient documentation

## 2016-09-13 DIAGNOSIS — M4690 Unspecified inflammatory spondylopathy, site unspecified: Secondary | ICD-10-CM | POA: Diagnosis not present

## 2016-09-13 DIAGNOSIS — D649 Anemia, unspecified: Secondary | ICD-10-CM | POA: Diagnosis not present

## 2016-09-13 DIAGNOSIS — F329 Major depressive disorder, single episode, unspecified: Secondary | ICD-10-CM | POA: Insufficient documentation

## 2016-09-13 DIAGNOSIS — M170 Bilateral primary osteoarthritis of knee: Secondary | ICD-10-CM | POA: Insufficient documentation

## 2016-09-13 DIAGNOSIS — G4730 Sleep apnea, unspecified: Secondary | ICD-10-CM | POA: Diagnosis not present

## 2016-09-13 DIAGNOSIS — Z7982 Long term (current) use of aspirin: Secondary | ICD-10-CM | POA: Diagnosis not present

## 2016-09-13 DIAGNOSIS — K529 Noninfective gastroenteritis and colitis, unspecified: Secondary | ICD-10-CM | POA: Diagnosis present

## 2016-09-13 DIAGNOSIS — K580 Irritable bowel syndrome with diarrhea: Secondary | ICD-10-CM | POA: Insufficient documentation

## 2016-09-13 DIAGNOSIS — Z9049 Acquired absence of other specified parts of digestive tract: Secondary | ICD-10-CM | POA: Diagnosis not present

## 2016-09-13 DIAGNOSIS — E119 Type 2 diabetes mellitus without complications: Secondary | ICD-10-CM | POA: Diagnosis not present

## 2016-09-13 DIAGNOSIS — E781 Pure hyperglyceridemia: Secondary | ICD-10-CM | POA: Diagnosis not present

## 2016-09-13 DIAGNOSIS — J449 Chronic obstructive pulmonary disease, unspecified: Secondary | ICD-10-CM | POA: Diagnosis not present

## 2016-09-13 DIAGNOSIS — Z955 Presence of coronary angioplasty implant and graft: Secondary | ICD-10-CM | POA: Diagnosis not present

## 2016-09-13 DIAGNOSIS — Z7984 Long term (current) use of oral hypoglycemic drugs: Secondary | ICD-10-CM | POA: Diagnosis not present

## 2016-09-13 DIAGNOSIS — I10 Essential (primary) hypertension: Secondary | ICD-10-CM | POA: Insufficient documentation

## 2016-09-13 DIAGNOSIS — I739 Peripheral vascular disease, unspecified: Secondary | ICD-10-CM | POA: Insufficient documentation

## 2016-09-13 DIAGNOSIS — Z96659 Presence of unspecified artificial knee joint: Secondary | ICD-10-CM | POA: Insufficient documentation

## 2016-09-13 DIAGNOSIS — Z79899 Other long term (current) drug therapy: Secondary | ICD-10-CM | POA: Diagnosis not present

## 2016-09-13 DIAGNOSIS — Z90710 Acquired absence of both cervix and uterus: Secondary | ICD-10-CM | POA: Insufficient documentation

## 2016-09-13 HISTORY — PX: COLONOSCOPY WITH PROPOFOL: SHX5780

## 2016-09-13 LAB — GLUCOSE, CAPILLARY: Glucose-Capillary: 124 mg/dL — ABNORMAL HIGH (ref 65–99)

## 2016-09-13 LAB — HM COLONOSCOPY

## 2016-09-13 SURGERY — COLONOSCOPY WITH PROPOFOL
Anesthesia: General

## 2016-09-13 MED ORDER — PIPERACILLIN-TAZOBACTAM 3.375 G IVPB 30 MIN
3.3750 g | Freq: Once | INTRAVENOUS | Status: AC
  Administered 2016-09-13: 3.375 g via INTRAVENOUS
  Filled 2016-09-13: qty 50

## 2016-09-13 MED ORDER — LACTATED RINGERS IV SOLN
INTRAVENOUS | Status: DC | PRN
  Administered 2016-09-13: 08:00:00 via INTRAVENOUS

## 2016-09-13 MED ORDER — PROPOFOL 10 MG/ML IV BOLUS
INTRAVENOUS | Status: DC | PRN
  Administered 2016-09-13 (×2): 20 mg via INTRAVENOUS
  Administered 2016-09-13: 40 mg via INTRAVENOUS

## 2016-09-13 MED ORDER — SODIUM CHLORIDE 0.9 % IV SOLN: INTRAVENOUS | Status: DC

## 2016-09-13 MED ORDER — PROPOFOL 500 MG/50ML IV EMUL
INTRAVENOUS | Status: DC | PRN
  Administered 2016-09-13: 75 ug/kg/min via INTRAVENOUS

## 2016-09-13 MED ORDER — SODIUM CHLORIDE 0.9 % IV SOLN
INTRAVENOUS | Status: DC
  Administered 2016-09-13: 08:00:00 via INTRAVENOUS

## 2016-09-13 NOTE — Anesthesia Preprocedure Evaluation (Signed)
Anesthesia Evaluation  Patient identified by MRN, date of birth, ID band Patient awake    Reviewed: Allergy & Precautions, H&P , NPO status , Patient's Chart, lab work & pertinent test results  Airway Mallampati: III  TM Distance: <3 FB Neck ROM: limited    Dental  (+) Poor Dentition, Chipped   Pulmonary shortness of breath and with exertion, asthma , sleep apnea and Continuous Positive Airway Pressure Ventilation , COPD,  COPD inhaler, Current Smoker,    Pulmonary exam normal breath sounds clear to auscultation       Cardiovascular Exercise Tolerance: Good hypertension, (-) angina+ CAD, + Past MI, + Cardiac Stents and + Peripheral Vascular Disease  Normal cardiovascular exam Rhythm:regular Rate:Normal     Neuro/Psych PSYCHIATRIC DISORDERS Depression negative neurological ROS     GI/Hepatic Neg liver ROS, GERD  Controlled,  Endo/Other  diabetes, Type 2  Renal/GU negative Renal ROS  negative genitourinary   Musculoskeletal  (+) Arthritis ,   Abdominal   Peds  Hematology negative hematology ROS (+)   Anesthesia Other Findings Past Medical History: No date: Anemia No date: Arthritis     Comment: back and knees No date: Asthma No date: COPD (chronic obstructive pulmonary disease) (* No date: Coronary artery disease     Comment: s/p stent placement 06/25/06 No date: Depression     Comment: secondary to the death of her husband (died               50) Diagnosed 05/2008: Diabetes mellitus without complication ([place]) No date: Diverticulitis No date: GERD (gastroesophageal reflux disease) No date: Hypertension No date: Hypertriglyceridemia No date: Sleep apnea     Comment: on CPAP No date: Spastic colon  Past Surgical History: with left ovary in place 1996: ABDOMINAL HYSTERECTOMY 1985: APPENDECTOMY 1984: [place]: CHOLECYSTECTOMY Arthroscopic left knee surgery : KNEE ARTHROSCOPY status post knee  surgey : KNEE SURGERY ([facility]): REPLACEMENT TOTAL KNEE shoulder operation secondary to a torn tendon: SHOULDER SURGERY  BMI    Body Mass Index:  38.96 kg/m      Reproductive/Obstetrics negative OB ROS                             Anesthesia Physical Anesthesia Plan  ASA: IV  Anesthesia Plan: General   Post-op Pain Management:    Induction:   Airway Management Planned:   Additional Equipment:   Intra-op Plan:   Post-operative Plan:   Informed Consent: I have reviewed the patients History and Physical, chart, labs and discussed the procedure including the risks, benefits and alternatives for the proposed anesthesia with the patient or authorized representative who has indicated his/her understanding and acceptance.   Dental Advisory Given  Plan Discussed with: Anesthesiologist, CRNA and Surgeon  Anesthesia Plan Comments: (Patient informed that they are higher risk for complications from anesthesia during this procedure due to their medical history.  Patient voiced understanding. )        Anesthesia Quick Evaluation

## 2016-09-13 NOTE — Transfer of Care (Signed)
Immediate Anesthesia Transfer of Care Note  Patient: Kathryn Friedman  Procedure(s) Performed: Procedure(s): COLONOSCOPY WITH PROPOFOL (N/A)  Patient Location: PACU and Endoscopy Unit  Anesthesia Type:General  Level of Consciousness: awake, alert  and oriented  Airway & Oxygen Therapy: Patient Spontanous Breathing and Patient connected to nasal cannula oxygen  Post-op Assessment: Report given to RN and Post -op Vital signs reviewed and stable  Post vital signs: Reviewed and stable  Last Vitals:  Vitals:   09/13/16 0742 09/13/16 0848  BP: 138/73 (!) 105/46  Pulse: 78 66  Resp: 20 11  Temp: 36.4 C 36.6 C    Last Pain:  Vitals:   09/13/16 0848  TempSrc: Tympanic         Complications: No apparent anesthesia complications

## 2016-09-13 NOTE — Anesthesia Postprocedure Evaluation (Signed)
Anesthesia Post Note  Patient: Kathryn Friedman  Procedure(s) Performed: Procedure(s) (LRB): COLONOSCOPY WITH PROPOFOL (N/A)  Patient location during evaluation: Endoscopy Anesthesia Type: General Level of consciousness: awake and alert Pain management: pain level controlled Vital Signs Assessment: post-procedure vital signs reviewed and stable Respiratory status: spontaneous breathing, nonlabored ventilation, respiratory function stable and patient connected to nasal cannula oxygen Cardiovascular status: blood pressure returned to baseline and stable Postop Assessment: no signs of nausea or vomiting Anesthetic complications: no    Last Vitals:  Vitals:   09/13/16 0908 09/13/16 0918  BP: (!) 153/78 (!) 149/78  Pulse: 67 65  Resp: (!) 23 17  Temp:      Last Pain:  Vitals:   09/13/16 0848  TempSrc: Tympanic                 Precious Haws Joelle [place]

## 2016-09-13 NOTE — H&P (Signed)
Primary Care Physician:  Einar Pheasant, MD Primary Gastroenterologist:  Dr. Vira Agar  Pre-Procedure History & Physical: HPI:  Kathryn Friedman is a 60 y.o. female is here for an colonoscopy.   Past Medical History:  Diagnosis Date  . Anemia   . Arthritis    back and knees  . Asthma   . COPD (chronic obstructive pulmonary disease) ([place])   . Coronary artery disease    s/p stent placement 06/25/06  . Depression    secondary to the death of her husband (died 41)  . Diabetes mellitus without complication ([place]) Diagnosed 05/2008  . Diverticulitis   . GERD (gastroesophageal reflux disease)   . Hypertension   . Hypertriglyceridemia   . Sleep apnea    on CPAP  . Spastic colon     Past Surgical History:  Procedure Laterality Date  . ABDOMINAL HYSTERECTOMY  with left ovary in place 1996  . APPENDECTOMY  1985  . CESAREAN SECTION  1984  . CHOLECYSTECTOMY  1985  . KNEE ARTHROSCOPY  Arthroscopic left knee surgery   . KNEE SURGERY  status post knee surgey   . REPLACEMENT TOTAL KNEE  ([facility])  . SHOULDER SURGERY  shoulder operation secondary to a torn tendon    Prior to Admission medications   Medication Sig Start Date End Date Taking? Authorizing Provider  amLODipine (NORVASC) 5 MG tablet Take 1 tablet (5 mg total) by mouth daily. 05/06/16  Yes Einar Pheasant, MD  aspirin 81 MG tablet Take 81 mg by mouth daily. Take 1 tablet by mouth once a day as directed   Yes Historical Provider, MD  atorvastatin (LIPITOR) 40 MG tablet Take 1 tablet (40 mg total) by mouth daily. 08/06/16  Yes Einar Pheasant, MD  Calcium Carbonate-Vitamin D (CALCIUM 600 + D PO) Take 600 mg by mouth daily.   Yes Historical Provider, MD  citalopram (CELEXA) 40 MG tablet Take 1 tablet (40 mg total) by mouth daily. 08/06/16  Yes Einar Pheasant, MD  clopidogrel (PLAVIX) 75 MG tablet Take 1 tablet (75 mg total) by mouth daily. 05/06/16  Yes Einar Pheasant, MD  cyclobenzaprine (FLEXERIL) 10 MG tablet TAKE ONE TABLET BY  MOUTH 3 TIMES A DAY AS NEEDED FOR MUSCLE SPASMS 06/10/16  Yes Einar Pheasant, MD  Fluticasone-Salmeterol (ADVAIR) 250-50 MCG/DOSE AEPB Inhale 1 puff into the lungs every 12 (twelve) hours. 05/06/16  Yes Einar Pheasant, MD  metFORMIN (GLUCOPHAGE XR) 500 MG 24 hr tablet Take two tablets bid 07/30/16  Yes Einar Pheasant, MD  naproxen (NAPROSYN) 500 MG tablet Take 1 tablet (500 mg total) by mouth 2 (two) times daily with a meal. 03/28/16  Yes Sable Feil, PA-C  pantoprazole (PROTONIX) 40 MG tablet Take 1 tablet (40 mg total) by mouth daily. 05/06/16  Yes Einar Pheasant, MD  propranolol (INDERAL) 20 MG tablet TAKE TWO TABLETS BY MOUTH 2 TIMES A DAY. 08/06/16  Yes Einar Pheasant, MD  traZODone (DESYREL) 100 MG tablet Take 100 mg by mouth at bedtime.   Yes Historical Provider, MD  acetaminophen (TYLENOL) 500 MG tablet Take 500 mg by mouth every 6 (six) hours as needed for pain.    Historical Provider, MD  albuterol (VENTOLIN HFA) 108 (90 BASE) MCG/ACT inhaler Inhale 2 puffs into the lungs every 4 (four) hours as needed. 05/01/15   Einar Pheasant, MD  glucose blood test strip One Touch Ultra Test Strips. Check blood sugar bid 07/27/16   Einar Pheasant, MD  oxyCODONE-acetaminophen (ROXICET) 5-325 MG tablet Take 1 tablet  by mouth every 4 (four) hours as needed for severe pain. 03/28/16   Sable Feil, PA-C    Allergies as of 09/11/2016  . (No Known Allergies)    Family History  Problem Relation Age of Onset  . Other Mother     Hit by a fire truck and has had multiple operations on her back , and has history of MVP   . Mitral valve prolapse Mother   . Heart disease Father     myocardial infarction and is status post bypass surgery  . Mitral valve prolapse Sister   . Hepatitis C Brother   . Cirrhosis Brother   . Colon cancer Paternal Aunt     Social History   Social History  . Marital status: Widowed    Spouse name: N/A  . Number of children: 1  . Years of education: N/A   Occupational History  .   [employer]   Social History Main Topics  . Smoking status: Current Every Day Smoker    Packs/day: 1.50    Years: 45.00    Types: Cigarettes  . Smokeless tobacco: Never Used  . Alcohol use No  . Drug use: No  . Sexual activity: Not on file   Other Topics Concern  . Not on file   Social History Narrative  . No narrative on file    Review of Systems: See HPI, otherwise negative ROS  Physical Exam: BP 138/73   Pulse 78   Temp 97.5 F (36.4 C) (Tympanic)   Resp 20   Ht 5\' 4"  (1.626 m)   Wt 103 kg (227 lb)   SpO2 97%   BMI 38.96 kg/m  General:   Alert,  pleasant and cooperative in NAD Head:  Normocephalic and atraumatic. Neck:  Supple; no masses or thyromegaly. Lungs:  Clear throughout to auscultation.    Heart:  Regular rate and rhythm. Abdomen:  Soft, nontender and nondistended. Normal bowel sounds, without guarding, and without rebound.   Neurologic:  Alert and  oriented x4;  grossly normal neurologically.  Impression/Plan: [name] is here for an colonoscopy to be performed for diarrhea  Risks, benefits, limitations, and alternatives regarding  colonoscopy have been reviewed with the patient.  Questions have been answered.  All parties agreeable.   Gaylyn Cheers, MD  09/13/2016, 8:02 AM

## 2016-09-13 NOTE — Op Note (Signed)
[facility] Gastroenterology Patient Name: Kathryn Friedman Procedure Date: 09/13/2016 8:06 AM MRN: [id number] Account #: [id number] Date of Birth: 1956-06-03 Admit Type: Outpatient Age: 60 Room: [facility] ENDO ROOM 1 Gender: Female Note Status: Finalized Procedure:            Colonoscopy Indications:          Chronic diarrhea Providers:            Manya Silvas, MD Referring MD:         Einar Pheasant, MD (Referring MD) Complications:        No immediate complications. Procedure:            Pre-Anesthesia Assessment:                       - After reviewing the risks and benefits, the patient                        was deemed in satisfactory condition to undergo the                        procedure.                       After obtaining informed consent, the colonoscope was                        passed under direct vision. Throughout the procedure,                        the patient's blood pressure, pulse, and oxygen                        saturations were monitored continuously. The                        Colonoscope was introduced through the anus and                        advanced to the the cecum, identified by appendiceal                        orifice and ileocecal valve. The colonoscopy was                        performed without difficulty. The patient tolerated the                        procedure well. The quality of the bowel preparation                        was adequate to identify polyps. Findings:      A small polyp was found in the ascending colon. The polyp was sessile.       The polyp was removed with a hot snare. Resection and retrieval were       complete. A clip was placed on the polypectomy site to decrease chance       of bleeding.      Three sessile polyps were found in the ascending colon. The polyps were       small in size. These polyps were removed with a hot snare. Resection  and       retrieval were complete.      Due to  chronic diarrhea I biopsied the ascending, transverse, descending       and sigmoid colon to check for microscopic colitits      A few small-mouthed diverticula were found in the sigmoid colon and       ascending colon.      Internal hemorrhoids were found during endoscopy. The hemorrhoids were       small and Grade I (internal hemorrhoids that do not prolapse). Impression:           - One small polyp in the ascending colon, removed with                        a hot snare. Resected and retrieved.                       - Three small polyps in the ascending colon, removed                        with a hot snare. Resected and retrieved.                       - Diverticulosis in the sigmoid colon and in the                        ascending colon.                       - Internal hemorrhoids. Recommendation:       - Await pathology results. Manya Silvas, MD 09/13/2016 8:47:28 AM This report has been signed electronically. Number of Addenda: 0 Note Initiated On: 09/13/2016 8:06 AM Scope Withdrawal Time: 0 hours 27 minutes 10 seconds  Total Procedure Duration: 0 hours 31 minutes 7 seconds       [facility]

## 2016-09-14 ENCOUNTER — Encounter: Payer: Self-pay | Admitting: Unknown Physician Specialty

## 2016-09-16 ENCOUNTER — Ambulatory Visit: Payer: 59 | Admitting: Pharmacist

## 2016-09-16 LAB — SURGICAL PATHOLOGY

## 2016-09-16 NOTE — Progress Notes (Signed)
  Medication Management Clinic Visit Note  Patient: Kathryn Friedman MRN: [id number] Date of Birth: 1956/06/24 PCP: Dr. Einar Pheasant  Patient was scheduled for her annual Medication Therapy Management visit with the pharmacist. She did not show for this visit. I will have the scheduler call Ms. Friddle to reschedule.    Batsheva Stevick K. Dicky Doe, PharmD [facility] [place] Operations Coordinator [phone]

## 2016-09-20 ENCOUNTER — Encounter: Payer: Self-pay | Admitting: Internal Medicine

## 2016-09-22 ENCOUNTER — Encounter: Payer: Self-pay | Admitting: Internal Medicine

## 2016-09-25 ENCOUNTER — Encounter: Payer: Self-pay | Admitting: Internal Medicine

## 2016-09-30 ENCOUNTER — Ambulatory Visit: Payer: 59 | Admitting: Pharmacist

## 2016-09-30 ENCOUNTER — Encounter: Payer: Self-pay | Admitting: Pharmacist

## 2016-09-30 VITALS — BP 142/68

## 2016-09-30 DIAGNOSIS — Z79899 Other long term (current) drug therapy: Secondary | ICD-10-CM

## 2016-09-30 NOTE — Progress Notes (Signed)
Medication Management Clinic Visit Note  Patient: Kathryn Friedman MRN: [id number] Date of Birth: 11-Dec-1955 PCP: Einar Pheasant, MD   Providence Lanius 60 y.o. female presents for a medication therapy management visit today. She states that she has lost weight because she is not eating regularly, as she does not have an appetite. She is not currently checking her blood sugars.  BP (!) 142/68 (BP Location: Right Arm, Patient Position: Sitting)   Patient Information   Past Medical History:  Diagnosis Date  . Anemia   . Arthritis    back and knees  . Asthma   . COPD (chronic obstructive pulmonary disease) ([place])   . Coronary artery disease    s/p stent placement 06/25/06  . Depression    secondary to the death of her husband (died 56)  . Diabetes mellitus without complication ([place]) Diagnosed 05/2008  . Diverticulitis   . GERD (gastroesophageal reflux disease)   . Hypertension   . Hypertriglyceridemia   . Sleep apnea    on CPAP  . Spastic colon       Past Surgical History:  Procedure Laterality Date  . ABDOMINAL HYSTERECTOMY  with left ovary in place 1996  . APPENDECTOMY  1985  . CESAREAN SECTION  1984  . CHOLECYSTECTOMY  1985  . COLONOSCOPY WITH PROPOFOL N/A 09/13/2016   Procedure: COLONOSCOPY WITH PROPOFOL;  Surgeon: Manya Silvas, MD;  Location: [facility] ENDOSCOPY;  Service: Endoscopy;  Laterality: N/A;  . KNEE ARTHROSCOPY  Arthroscopic left knee surgery   . KNEE SURGERY  status post knee surgey   . REPLACEMENT TOTAL KNEE  ([facility])  . SHOULDER SURGERY  shoulder operation secondary to a torn tendon     Family History  Problem Relation Age of Onset  . Other Mother     Hit by a fire truck and has had multiple operations on her back , and has history of MVP   . Mitral valve prolapse Mother   . Heart disease Father     myocardial infarction and is status post bypass surgery  . Mitral valve prolapse Sister   . Hepatitis C Brother   . Cirrhosis Brother   . Colon  cancer Paternal Aunt    Prior to Admission medications   Medication Sig Start Date End Date Taking? Authorizing Provider  acetaminophen (TYLENOL) 500 MG tablet Take 500 mg by mouth every 6 (six) hours as needed for pain.   Yes Historical Provider, MD  albuterol (VENTOLIN HFA) 108 (90 BASE) MCG/ACT inhaler Inhale 2 puffs into the lungs every 4 (four) hours as needed. Patient taking differently: Inhale 2 puffs into the lungs every 4 (four) hours as needed. 2 puffs twice daily 05/01/15  Yes Einar Pheasant, MD  amLODipine (NORVASC) 5 MG tablet Take 1 tablet (5 mg total) by mouth daily. 05/06/16  Yes Einar Pheasant, MD  aspirin 81 MG tablet Take 81 mg by mouth daily. Take 1 tablet by mouth once a day as directed   Yes Historical Provider, MD  atorvastatin (LIPITOR) 40 MG tablet Take 1 tablet (40 mg total) by mouth daily. 08/06/16  Yes Einar Pheasant, MD  Brexpiprazole (REXULTI) 2 MG TABS Take 2 mg by mouth daily.   Yes Historical Provider, MD  Calcium Carbonate-Vitamin D (CALCIUM 600 + D PO) Take 600 mg by mouth daily.   Yes Historical Provider, MD  citalopram (CELEXA) 20 MG tablet Take 20 mg by mouth at bedtime.   Yes Historical Provider, MD  clopidogrel (PLAVIX) 75 MG tablet  Take 1 tablet (75 mg total) by mouth daily. 05/06/16  Yes Einar Pheasant, MD  colestipol (COLESTID) 1 g tablet Take 2 g by mouth daily.   Yes Historical Provider, MD  cyclobenzaprine (FLEXERIL) 10 MG tablet TAKE ONE TABLET BY MOUTH 3 TIMES A DAY AS NEEDED FOR MUSCLE SPASMS 06/10/16  Yes Einar Pheasant, MD  DULoxetine (CYMBALTA) 60 MG capsule Take 60 mg by mouth daily.   Yes Historical Provider, MD  Fluticasone-Salmeterol (ADVAIR) 250-50 MCG/DOSE AEPB Inhale 1 puff into the lungs every 12 (twelve) hours. 05/06/16  Yes Einar Pheasant, MD  metFORMIN (GLUCOPHAGE XR) 500 MG 24 hr tablet Take two tablets bid 07/30/16  Yes Einar Pheasant, MD  pantoprazole (PROTONIX) 40 MG tablet Take 1 tablet (40 mg total) by mouth daily. 05/06/16  Yes Einar Pheasant, MD  propranolol (INDERAL) 20 MG tablet TAKE TWO TABLETS BY MOUTH 2 TIMES A DAY. 08/06/16  Yes Einar Pheasant, MD  traZODone (DESYREL) 100 MG tablet Take 200 mg by mouth at bedtime.    Yes Historical Provider, MD  glucose blood test strip One Touch Ultra Test Strips. Check blood sugar bid Patient not taking: Reported on 09/30/2016 07/27/16   Einar Pheasant, MD  naproxen (NAPROSYN) 500 MG tablet Take 1 tablet (500 mg total) by mouth 2 (two) times daily with a meal. Patient not taking: Reported on 09/30/2016 03/28/16   Sable Feil, PA-C  oxyCODONE-acetaminophen (ROXICET) 5-325 MG tablet Take 1 tablet by mouth every 4 (four) hours as needed for severe pain. Patient not taking: Reported on 09/30/2016 03/28/16   Sable Feil, PA-C    New Diagnoses (since last visit): n/a  Family Support: Poor  Lifestyle Diet: Breakfast: no breakfast Lunch: granola bars Dinner: Protein and veggies Drinks: Coffee, Pepsi, Strawberry Soda    Current Exercise Habits: The patient does not participate in regular exercise at present       History  Alcohol Use No      History  Smoking Status  . Current Every Day Smoker  . Packs/day: 1.50  . Years: 45.00  . Types: Cigarettes  Smokeless Tobacco  . Never Used      Health Maintenance  Topic Date Due  . Hepatitis C Screening  Feb 11, 1956  . FOOT EXAM  03/08/1966  . OPHTHALMOLOGY EXAM  03/08/1966  . HIV Screening  03/09/1971  . TETANUS/TDAP  03/09/1975  . PAP SMEAR  03/08/1977  . ZOSTAVAX  03/08/2016  . HEMOGLOBIN A1C  12/15/2016  . MAMMOGRAM  06/14/2017  . URINE MICROALBUMIN  06/14/2017  . PNEUMOCOCCAL POLYSACCHARIDE VACCINE (2) 09/23/2018  . COLONOSCOPY  09/13/2026  . INFLUENZA VACCINE  Completed   Labs: A1c = 7.6% 06/14/16 TC = 178 mg/dl; TG = 166 mg/dl; HDL = 39 mg/dl; LDL = 10 mg/dl (06/14/16)  Assessment and Plan: 1. Physical was completed on 08/06/16. Mammogram completed 06/17/16. Colonoscopy completed 09/13/16. Lung screening CT  completed on 08/16/16. 2. Smoking Cessation: Currently smoking 1.5 packs per day. Discussed smoking cessation, however, patient is not ready to quit. Recently had a lung cancer screening CT. 3. Diabetes: not currently checking blood sugars. On metformin ER due to issues with loose stools. Poor diet, patient experiencing a decrease in normal appetite. 4. Cardiovascular: Hx of CAD, s/p stents. Currently on clopidogrel and aspirin. Patient is also on atorvastatin, amlodipine, and propranolol. 5. COPD: currently on Advair and Ventolin. 6. Depression: currently on citalopram, brexpiprazole, duloxetine and trazodone. 7. GERD: currently on pantoprazole. 8. OSA: currently using CPAP.   Rosann Gorum  Glendon Axe, PharmD [facility] [place] Operations Coordinator [phone]

## 2016-10-15 ENCOUNTER — Other Ambulatory Visit: Payer: Self-pay

## 2016-10-15 NOTE — Telephone Encounter (Signed)
Last filled 06/10/16 90 1rf

## 2016-10-16 MED ORDER — CYCLOBENZAPRINE HCL 10 MG PO TABS: ORAL_TABLET | ORAL | 1 refills | Status: DC

## 2016-10-28 ENCOUNTER — Other Ambulatory Visit: Payer: Self-pay | Admitting: Internal Medicine

## 2016-10-28 ENCOUNTER — Other Ambulatory Visit (INDEPENDENT_AMBULATORY_CARE_PROVIDER_SITE_OTHER): Payer: 59

## 2016-10-28 DIAGNOSIS — E7800 Pure hypercholesterolemia, unspecified: Secondary | ICD-10-CM

## 2016-10-28 DIAGNOSIS — E119 Type 2 diabetes mellitus without complications: Secondary | ICD-10-CM | POA: Diagnosis not present

## 2016-10-28 LAB — BASIC METABOLIC PANEL
BUN: 7 mg/dL (ref 6–23)
CHLORIDE: 101 meq/L (ref 96–112)
CO2: 26 mEq/L (ref 19–32)
CREATININE: 0.72 mg/dL (ref 0.40–1.20)
Calcium: 9.2 mg/dL (ref 8.4–10.5)
GFR: 87.63 mL/min (ref >60.00)
GLUCOSE: 115 mg/dL — AB (ref 70–99)
POTASSIUM: 4 meq/L (ref 3.5–5.1)
Sodium: 137 mEq/L (ref 135–145)

## 2016-10-28 LAB — HEPATIC FUNCTION PANEL
ALBUMIN: 3.9 g/dL (ref 3.5–5.2)
ALK PHOS: 70 U/L (ref 39–117)
ALT: 8 U/L (ref 0–35)
AST: 14 U/L (ref 0–37)
BILIRUBIN DIRECT: 0.1 mg/dL (ref 0.0–0.3)
TOTAL PROTEIN: 6.8 g/dL (ref 6.0–8.3)
Total Bilirubin: 0.5 mg/dL (ref 0.2–1.2)

## 2016-10-28 LAB — LIPID PANEL
Cholesterol: 117 mg/dL (ref 0–200)
HDL: 42.7 mg/dL (ref >39.00)
LDL Cholesterol: 49 mg/dL (ref 0–99)
NONHDL: 74.34
TRIGLYCERIDES: 126 mg/dL (ref 0.0–149.0)
Total CHOL/HDL Ratio: 3
VLDL: 25.2 mg/dL (ref 0.0–40.0)

## 2016-10-28 LAB — HEMOGLOBIN A1C: Hgb A1c MFr Bld: 6.3 % (ref 4.6–6.5)

## 2016-10-29 ENCOUNTER — Encounter: Payer: Self-pay | Admitting: Internal Medicine

## 2016-10-29 ENCOUNTER — Other Ambulatory Visit: Payer: 59

## 2016-10-30 ENCOUNTER — Ambulatory Visit: Admit: 2016-10-30 | Payer: 59 | Admitting: Unknown Physician Specialty

## 2016-10-30 SURGERY — COLONOSCOPY WITH PROPOFOL
Anesthesia: General

## 2016-10-31 ENCOUNTER — Ambulatory Visit: Payer: 59 | Admitting: Internal Medicine

## 2016-11-12 ENCOUNTER — Other Ambulatory Visit: Payer: Self-pay | Admitting: Internal Medicine

## 2016-11-20 ENCOUNTER — Telehealth: Payer: Self-pay | Admitting: Pharmacist

## 2016-11-20 NOTE — Telephone Encounter (Signed)
Faxed scripts to [facility] for refills on Ventolin HFA & Advair 250/50.

## 2016-11-21 ENCOUNTER — Other Ambulatory Visit: Payer: Self-pay | Admitting: *Deleted

## 2016-11-21 DIAGNOSIS — R599 Enlarged lymph nodes, unspecified: Secondary | ICD-10-CM

## 2016-12-12 ENCOUNTER — Ambulatory Visit: Payer: 59

## 2016-12-19 ENCOUNTER — Ambulatory Visit: Payer: 59

## 2016-12-31 ENCOUNTER — Ambulatory Visit
Admission: RE | Admit: 2016-12-31 | Discharge: 2016-12-31 | Disposition: A | Discharge status: Home / Self Care | Payer: 59 | Source: Ambulatory Visit | Attending: Oncology | Admitting: Oncology

## 2016-12-31 DIAGNOSIS — I700 Atherosclerosis of aorta: Secondary | ICD-10-CM | POA: Diagnosis not present

## 2016-12-31 DIAGNOSIS — R918 Other nonspecific abnormal finding of lung field: Secondary | ICD-10-CM | POA: Diagnosis not present

## 2016-12-31 DIAGNOSIS — I2510 Atherosclerotic heart disease of native coronary artery without angina pectoris: Secondary | ICD-10-CM | POA: Insufficient documentation

## 2016-12-31 DIAGNOSIS — R590 Localized enlarged lymph nodes: Secondary | ICD-10-CM | POA: Diagnosis not present

## 2016-12-31 DIAGNOSIS — R599 Enlarged lymph nodes, unspecified: Secondary | ICD-10-CM

## 2016-12-31 DIAGNOSIS — F17200 Nicotine dependence, unspecified, uncomplicated: Secondary | ICD-10-CM | POA: Insufficient documentation

## 2017-01-20 ENCOUNTER — Telehealth: Payer: Self-pay | Admitting: Internal Medicine

## 2017-01-20 ENCOUNTER — Other Ambulatory Visit: Payer: Self-pay | Admitting: Internal Medicine

## 2017-01-20 DIAGNOSIS — Z7689 Persons encountering health services in other specified circumstances: Secondary | ICD-10-CM

## 2017-01-20 NOTE — Telephone Encounter (Signed)
Put in your folder for review.

## 2017-01-20 NOTE — Telephone Encounter (Signed)
Pt dropped off FMLA paper work to be completed by Dr. Nicki Reaper. Paper work is up front in [employer].

## 2017-01-20 NOTE — Telephone Encounter (Signed)
Medication: flexeril 10mg   Directions: 1 po tid PRN  Last given: 10/16/16  # 90 Number refills: 2 Last o/v: 08-11-16 Follow up: 02-07-17

## 2017-01-21 NOTE — Telephone Encounter (Signed)
Form completed.  Placed in your box.

## 2017-01-21 NOTE — Telephone Encounter (Signed)
Form faxed holding to send to scan

## 2017-01-24 NOTE — Telephone Encounter (Signed)
Spoke to patient about this states we have reviewed parts that were not completed. I have corrected all but part c number 3 section a. Corrections per patient should be 6-8 days every 30days. Please advise if we can make corrections. Have put ppw in red folder.

## 2017-01-24 NOTE — Telephone Encounter (Signed)
Pt stated that the FMLA paperwork was not completed. Pt stated the FMLA paper will need a start and end date. Please call pt to discuss the correct way to have paperwork filled out  Pt contact [phone]

## 2017-01-27 NOTE — Telephone Encounter (Signed)
Form completed and placed in your box.  I changed the days to 3-4 days q 30 days.  She is not having the flares as she was previously.

## 2017-01-27 NOTE — Telephone Encounter (Signed)
Spoke to patient she states that she is having loose bowels 6-8 days in a row. Per Dr. Nicki Reaper if she needs more than 3-4 days she will need to be reevaluated by Gi. I have informed pt that we will fax in form as filled out by Dr. Nicki Reaper and she will make app with GI to get revaluated and another form filled out by their office. I have faxed to [facility] and sent to scan.

## 2017-02-07 ENCOUNTER — Encounter: Payer: Self-pay | Admitting: Internal Medicine

## 2017-02-07 ENCOUNTER — Ambulatory Visit (INDEPENDENT_AMBULATORY_CARE_PROVIDER_SITE_OTHER): Payer: 59 | Admitting: Internal Medicine

## 2017-02-07 VITALS — BP 128/78 | HR 81 | Temp 99.3°F | Resp 16 | Ht 64.0 in | Wt 228.2 lb

## 2017-02-07 DIAGNOSIS — D649 Anemia, unspecified: Secondary | ICD-10-CM | POA: Diagnosis not present

## 2017-02-07 DIAGNOSIS — I2510 Atherosclerotic heart disease of native coronary artery without angina pectoris: Secondary | ICD-10-CM

## 2017-02-07 DIAGNOSIS — G4733 Obstructive sleep apnea (adult) (pediatric): Secondary | ICD-10-CM

## 2017-02-07 DIAGNOSIS — E7800 Pure hypercholesterolemia, unspecified: Secondary | ICD-10-CM

## 2017-02-07 DIAGNOSIS — R1900 Intra-abdominal and pelvic swelling, mass and lump, unspecified site: Secondary | ICD-10-CM | POA: Diagnosis not present

## 2017-02-07 DIAGNOSIS — E119 Type 2 diabetes mellitus without complications: Secondary | ICD-10-CM | POA: Diagnosis not present

## 2017-02-07 DIAGNOSIS — Z87891 Personal history of nicotine dependence: Secondary | ICD-10-CM | POA: Diagnosis not present

## 2017-02-07 DIAGNOSIS — I10 Essential (primary) hypertension: Secondary | ICD-10-CM | POA: Diagnosis not present

## 2017-02-07 DIAGNOSIS — F32A Depression, unspecified: Secondary | ICD-10-CM

## 2017-02-07 DIAGNOSIS — F329 Major depressive disorder, single episode, unspecified: Secondary | ICD-10-CM | POA: Diagnosis not present

## 2017-02-07 DIAGNOSIS — J449 Chronic obstructive pulmonary disease, unspecified: Secondary | ICD-10-CM | POA: Diagnosis not present

## 2017-02-07 DIAGNOSIS — R197 Diarrhea, unspecified: Secondary | ICD-10-CM

## 2017-02-07 LAB — LIPID PANEL
CHOL/HDL RATIO: 3
Cholesterol: 105 mg/dL (ref 0–200)
HDL: 41 mg/dL (ref >39.00)
LDL CALC: 41 mg/dL (ref 0–99)
NonHDL: 64.24
TRIGLYCERIDES: 116 mg/dL (ref 0.0–149.0)
VLDL: 23.2 mg/dL (ref 0.0–40.0)

## 2017-02-07 LAB — BASIC METABOLIC PANEL
BUN: 7 mg/dL (ref 6–23)
CO2: 27 mEq/L (ref 19–32)
Calcium: 9.2 mg/dL (ref 8.4–10.5)
Chloride: 101 mEq/L (ref 96–112)
Creatinine, Ser: 0.72 mg/dL (ref 0.40–1.20)
GFR: 87.55 mL/min (ref >60.00)
Glucose, Bld: 89 mg/dL (ref 70–99)
Potassium: 4.2 mEq/L (ref 3.5–5.1)
Sodium: 132 mEq/L — ABNORMAL LOW (ref 135–145)

## 2017-02-07 LAB — TSH: TSH: 0.95 u[IU]/mL (ref 0.35–4.50)

## 2017-02-07 LAB — HEPATIC FUNCTION PANEL
ALBUMIN: 3.5 g/dL (ref 3.5–5.2)
ALK PHOS: 88 U/L (ref 39–117)
ALT: 10 U/L (ref 0–35)
AST: 16 U/L (ref 0–37)
Bilirubin, Direct: 0.1 mg/dL (ref 0.0–0.3)
TOTAL PROTEIN: 6.4 g/dL (ref 6.0–8.3)
Total Bilirubin: 0.4 mg/dL (ref 0.2–1.2)

## 2017-02-07 LAB — HEMOGLOBIN A1C: Hgb A1c MFr Bld: 6.7 % — ABNORMAL HIGH (ref 4.6–6.5)

## 2017-02-07 NOTE — Progress Notes (Signed)
Pre-visit discussion using our clinic review tool. No additional management support is needed unless otherwise documented below in the visit note.  

## 2017-02-07 NOTE — Progress Notes (Signed)
Patient ID: Kathryn Friedman, female   DOB: 19-Jul-1956, 61 y.o.   MRN: [id number]   Subjective:    Patient ID: Kathryn Friedman, female    DOB: 1956-09-18, 61 y.o.   MRN: [id number]  HPI  Patient here for a scheduled follow up.  She feels better.  Her relationship is going well.  Planning to move in together.  No chest pain.  Breathing stable.  Eating.  No abdominal pain.  Still with diarrhea.  She does feel is overall better.  Still has flares, but overall better.  Colestipol has helped.  Saw GI.  Desires no further w/up or evaluation now.     Past Medical History:  Diagnosis Date  . Anemia   . Arthritis    back and knees  . Asthma   . COPD (chronic obstructive pulmonary disease) ([place])   . Coronary artery disease    s/p stent placement 06/25/06  . Depression    secondary to the death of her husband (died 89)  . Diabetes mellitus without complication ([place]) Diagnosed 05/2008  . Diverticulitis   . GERD (gastroesophageal reflux disease)   . Hypertension   . Hypertriglyceridemia   . Sleep apnea    on CPAP  . Spastic colon    Past Surgical History:  Procedure Laterality Date  . ABDOMINAL HYSTERECTOMY  with left ovary in place 1996  . APPENDECTOMY  1985  . CESAREAN SECTION  1984  . CHOLECYSTECTOMY  1985  . COLONOSCOPY WITH PROPOFOL N/A 09/13/2016   Procedure: COLONOSCOPY WITH PROPOFOL;  Surgeon: Manya Silvas, MD;  [facility]: [facility] END[facility]COPY;  Service: Endoscopy;  Laterality: N/A;  . KNEE ARTHROSCOPY  Arthroscopic left knee surgery   . KNEE SURGERY  status post knee surgey   . REPLACEMENT TOTAL KNEE  ([facility])  . SHOULDER SURGERY  shoulder operation secondary to a torn tendon   Family History  Problem Relation Age of Onset  . Other Mother     Hit by a fire truck and has had multiple operations on her back , and has history of MVP   . Mitral valve prolapse Mother   . Heart disease Father     myocardial infarction and is status post bypass surgery  . Mitral valve prolapse  Sister   . Hepatitis C Brother   . Cirrhosis Brother   . Colon cancer Paternal Aunt    Social History   Social History  . Marital status: Widowed    Spouse name: N/A  . Number of children: 1  . Years of education: N/A   Occupational History  .  Nti   Social History Main Topics  . Smoking status: Current Every Day Smoker    Packs/day: 1.50    Years: 45.00    Types: Cigarettes  . Smokeless tobacco: Never Used  . Alcohol use No  . Drug use: No  . Sexual activity: Not Asked   Other Topics Concern  . None   Social History Narrative  . None    Outpatient Encounter Prescriptions as of 02/07/2017  Medication Sig  . acetaminophen (TYLENOL) 500 MG tablet Take 500 mg by mouth every 6 (six) hours as needed for pain.  Marland Kitchen albuterol (VENTOLIN HFA) 108 (90 BASE) MCG/ACT inhaler Inhale 2 puffs into the lungs every 4 (four) hours as needed. (Patient taking differently: Inhale 2 puffs into the lungs every 4 (four) hours as needed. 2 puffs twice daily)  . amLODipine (NORVASC) 5 MG tablet TAKE ONE TABLET BY MOUTH EVERY  DAY  . aspirin 81 MG tablet Take 81 mg by mouth daily. Take 1 tablet by mouth once a day as directed  . atorvastatin (LIPITOR) 40 MG tablet Take 1 tablet (40 mg total) by mouth daily.  . Brexpiprazole (REXULTI) 2 MG TABS Take 2 mg by mouth daily.  . Calcium Carbonate-Vitamin D (CALCIUM 600 + D PO) Take 600 mg by mouth daily.  . citalopram (CELEXA) 20 MG tablet Take 20 mg by mouth at bedtime.  . clopidogrel (PLAVIX) 75 MG tablet TAKE ONE TABLET BY MOUTH EVERY DAY  . colestipol (COLESTID) 1 g tablet Take 2 g by mouth daily.  . cyclobenzaprine (FLEXERIL) 10 MG tablet TAKE ONE TABLET BY MOUTH 3 TIMES A DAY AS NEEDED FOR MUSCLE SPASMS  . DULoxetine (CYMBALTA) 60 MG capsule Take 60 mg by mouth daily.  . Fluticasone-Salmeterol (ADVAIR) 250-50 MCG/DOSE AEPB Inhale 1 puff into the lungs every 12 (twelve) hours.  Marland Kitchen glucose blood test strip One Touch Ultra Test Strips. Check blood sugar  bid  . metFORMIN (GLUCOPHAGE XR) 500 MG 24 hr tablet Take two tablets bid  . naproxen (NAPROSYN) 500 MG tablet Take 1 tablet (500 mg total) by mouth 2 (two) times daily with a meal.  . pantoprazole (PROTONIX) 40 MG tablet Take 1 tablet (40 mg total) by mouth daily.  . propranolol (INDERAL) 20 MG tablet TAKE TWO TABLETS BY MOUTH 2 TIMES A DAY.  . traZODone (DESYREL) 100 MG tablet Take 200 mg by mouth at bedtime.   . [DISCONTINUED] oxyCODONE-acetaminophen (ROXICET) 5-325 MG tablet Take 1 tablet by mouth every 4 (four) hours as needed for severe pain. (Patient not taking: Reported on 09/30/2016)   No facility-administered encounter medications on file as of 02/07/2017.     Review of Systems  Constitutional: Negative for appetite change and unexpected weight change.  HENT: Negative for congestion and sinus pressure.   Respiratory: Negative for cough, chest tightness and shortness of breath.   Cardiovascular: Negative for chest pain, palpitations and leg swelling.  Gastrointestinal: Positive for diarrhea. Negative for abdominal pain, nausea and vomiting.  Genitourinary: Negative for difficulty urinating and dysuria.  Musculoskeletal: Negative for joint swelling and myalgias.  Skin: Negative for color change and rash.  Neurological: Negative for dizziness, light-headedness and headaches.  Psychiatric/Behavioral: Negative for agitation and dysphoric mood.       Objective:     Blood pressure rechecked by me:  128/78  Physical Exam  Constitutional: She appears well-developed and well-nourished. No distress.  HENT:  Nose: Nose normal.  Mouth/Throat: Oropharynx is clear and moist.  Neck: Neck supple. No thyromegaly present.  Cardiovascular: Normal rate and regular rhythm.   Pulmonary/Chest: Breath sounds normal. No respiratory distress. She has no wheezes.  Abdominal: Soft. Bowel sounds are normal. There is no tenderness.  Musculoskeletal: She exhibits no edema or tenderness.    Lymphadenopathy:    She has no cervical adenopathy.  Skin: No rash noted. No erythema.  Psychiatric: She has a normal mood and affect. Her behavior is normal.    BP 128/78   Pulse 81   Temp 99.3 F (37.4 C) (Oral)   Resp 16   Ht '5\' 4"'  (1.626 m)   Wt 228 lb 3.2 oz (103.5 kg)   SpO2 95%   BMI 39.17 kg/m  Wt Readings from Last 3 Encounters:  02/07/17 228 lb 3.2 oz (103.5 kg)  09/13/16 227 lb (103 kg)  08/16/16 232 lb (105.2 kg)     Lab Results  Component  Value Date   WBC 9.4 07/10/2016   HGB 12.3 07/10/2016   HCT 38.0 07/10/2016   PLT 244.0 07/10/2016   GLUCOSE 89 02/07/2017   CHOL 105 02/07/2017   TRIG 116.0 02/07/2017   HDL 41.00 02/07/2017   LDLCALC 41 02/07/2017   ALT 10 02/07/2017   AST 16 02/07/2017   NA 132 (L) 02/07/2017   K 4.2 02/07/2017   CL 101 02/07/2017   CREATININE 0.72 02/07/2017   BUN 7 02/07/2017   CO2 27 02/07/2017   TSH 0.95 02/07/2017   HGBA1C 6.7 (H) 02/07/2017   MICROALBUR <0.7 06/14/2016    Ct Chest Wo Contrast  Result Date: 12/31/2016 CLINICAL DATA:  Follow-up mild thoracic adenopathy on prior screening chest CT. EXAM: CT CHEST WITHOUT CONTRAST TECHNIQUE: Multidetector CT imaging of the chest was performed following the standard protocol without IV contrast. COMPARISON:  08/16/2016 screening chest CT. FINDINGS: Cardiovascular: Normal heart size. No significant pericardial fluid/thickening. Left main, left anterior descending, left circumflex and right coronary atherosclerosis. Atherosclerotic nonaneurysmal thoracic aorta. Normal caliber pulmonary arteries. Mediastinum/Nodes: No convincing discrete thyroid nodules. Unremarkable esophagus. No axillary adenopathy. Mild right paratracheal lymphadenopathy measuring up to 1.3 cm (series 2/ image 43), stable. Mildly enlarged 1.5 cm subcarinal node (series 2/ image 59), stable. Stable mild bilateral hilar fullness suggesting mild bilateral hilar adenopathy, poorly delineated on this noncontrast study.  No new pathologically enlarged mediastinal or gross hilar nodes. Lungs/Pleura: No pneumothorax. No pleural effusion. No acute consolidative airspace disease or lung masses. There is extensive patchy confluent ground-glass centrilobular micronodularity throughout both lungs, upper lobe predominant with relative sparing of the lung bases. There is associated patchy interlobular septal thickening. No significant regions of traction bronchiectasis, architectural distortion or frank honeycombing. These findings have not significantly changed. Upper abdomen: Cholecystectomy. Musculoskeletal: No aggressive appearing focal osseous lesions. Moderate thoracic spondylosis. IMPRESSION: 1. 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 2. Interval stability of mild mediastinal and bilateral hilar lymphadenopathy, suggesting benign reactive adenopathy. This adenopathy can be reassessed on follow-up screening chest CT. 3. Aortic atherosclerosis. Left main and 3 vessel coronary atherosclerosis. Electronically Signed   By: Ilona Sorrel M.D.   On: 12/31/2016 11:12       Assessment & Plan:   Problem List Items Addressed This Visit    Anemia    Follow cbc.       CAD (coronary artery disease)    s/p stent placement.  On plavix.  Has seen cardiology.  Stable.        COPD (chronic obstructive pulmonary disease) [facility])    Previously saw Dr Raul Del.  Continue inhalers.  Discussed CT scan.  She declines referral back to pulmonary for further evaluation and w/up.        Depression    Stable on citalopram.  Doing better.        Diabetes  ([place]) - Primary    Diet and exercise.  Follow met b and a1c.  Continue current medication regimen for now.  Follow.   Lab Results  Component Value Date   HGBA1C 6.7 (H) 02/07/2017        Relevant Orders   Hemoglobin A1c (Completed)   Basic metabolic panel (Completed)   Diarrhea  Saw GI.  Had abdominal CT.  On colestipol.  Is better.  Desires no further w/up at this time.  Follow.        Essential hypertension, benign    Blood pressure on recheck improved and ok.  Continue same medication regimen.  Follow pressures.  Follow metabolic panel.        Relevant Orders   TSH (Completed)   Hypercholesterolemia    Low cholesterol diet and exercise.  On lipitor.  Follow lipid panel and liver function tests.        Relevant Orders   Hepatic function panel (Completed)   Lipid panel (Completed)   Lump in the abdomen    Had abdominal CT.  No pain.  Desires no further intervention or evaluation.  Follow.        Obstructive sleep apnea    CPAP.       Personal history of tobacco use, presenting hazards to health    Discussed the need to quit smoking.  Follow.           Einar Pheasant, MD

## 2017-02-09 ENCOUNTER — Encounter: Payer: Self-pay | Admitting: Internal Medicine

## 2017-02-09 NOTE — Assessment & Plan Note (Signed)
CPAP.  

## 2017-02-09 NOTE — Assessment & Plan Note (Signed)
Had abdominal CT.  No pain.  Desires no further intervention or evaluation.  Follow.

## 2017-02-09 NOTE — Assessment & Plan Note (Signed)
Discussed the need to quit smoking. Follow.  

## 2017-02-09 NOTE — Assessment & Plan Note (Signed)
Stable on citalopram.  Doing better.

## 2017-02-09 NOTE — Assessment & Plan Note (Signed)
s/p stent placement.  On plavix.  Has seen cardiology.  Stable.

## 2017-02-09 NOTE — Assessment & Plan Note (Signed)
Diet and exercise.  Follow met b and a1c.  Continue current medication regimen for now.  Follow.   Lab Results  Component Value Date   HGBA1C 6.7 (H) 02/07/2017

## 2017-02-09 NOTE — Assessment & Plan Note (Signed)
Blood pressure on recheck improved and ok.  Continue same medication regimen.  Follow pressures.  Follow metabolic panel.

## 2017-02-09 NOTE — Assessment & Plan Note (Signed)
Low cholesterol diet and exercise.  On lipitor.  Follow lipid panel and liver function tests.   

## 2017-02-09 NOTE — Assessment & Plan Note (Signed)
Saw GI.  Had abdominal CT.  On colestipol.  Is better.  Desires no further w/up at this time.  Follow.

## 2017-02-09 NOTE — Assessment & Plan Note (Signed)
Previously saw Dr Raul Del.  Continue inhalers.  Discussed CT scan.  She declines referral back to pulmonary for further evaluation and w/up.

## 2017-02-09 NOTE — Assessment & Plan Note (Signed)
Follow cbc.  

## 2017-02-10 ENCOUNTER — Other Ambulatory Visit: Payer: Self-pay | Admitting: Internal Medicine

## 2017-02-10 DIAGNOSIS — E871 Hypo-osmolality and hyponatremia: Secondary | ICD-10-CM

## 2017-02-10 NOTE — Progress Notes (Signed)
Order placed for f/u sodium check.   

## 2017-02-11 ENCOUNTER — Other Ambulatory Visit: Payer: Self-pay | Admitting: Internal Medicine

## 2017-02-19 ENCOUNTER — Other Ambulatory Visit: Payer: 59

## 2017-02-24 ENCOUNTER — Other Ambulatory Visit: Payer: Self-pay | Admitting: Internal Medicine

## 2017-02-27 ENCOUNTER — Other Ambulatory Visit: Payer: 59

## 2017-03-04 ENCOUNTER — Other Ambulatory Visit: Payer: 59

## 2017-03-10 ENCOUNTER — Other Ambulatory Visit: Payer: Self-pay | Admitting: Internal Medicine

## 2017-03-10 NOTE — Telephone Encounter (Signed)
Refilled: 01/21/17 Last OV: 02/07/17 Last Labs: 02/07/17 Future OV: 08/14/17 Please advise?

## 2017-03-18 ENCOUNTER — Encounter: Payer: Self-pay | Admitting: Internal Medicine

## 2017-03-18 ENCOUNTER — Ambulatory Visit (INDEPENDENT_AMBULATORY_CARE_PROVIDER_SITE_OTHER): Payer: 59 | Admitting: Internal Medicine

## 2017-03-18 DIAGNOSIS — Z87891 Personal history of nicotine dependence: Secondary | ICD-10-CM

## 2017-03-18 DIAGNOSIS — E7800 Pure hypercholesterolemia, unspecified: Secondary | ICD-10-CM | POA: Diagnosis not present

## 2017-03-18 DIAGNOSIS — J449 Chronic obstructive pulmonary disease, unspecified: Secondary | ICD-10-CM

## 2017-03-18 DIAGNOSIS — I2510 Atherosclerotic heart disease of native coronary artery without angina pectoris: Secondary | ICD-10-CM

## 2017-03-18 DIAGNOSIS — E119 Type 2 diabetes mellitus without complications: Secondary | ICD-10-CM

## 2017-03-18 DIAGNOSIS — G4733 Obstructive sleep apnea (adult) (pediatric): Secondary | ICD-10-CM | POA: Diagnosis not present

## 2017-03-18 DIAGNOSIS — I10 Essential (primary) hypertension: Secondary | ICD-10-CM

## 2017-03-18 DIAGNOSIS — K219 Gastro-esophageal reflux disease without esophagitis: Secondary | ICD-10-CM | POA: Diagnosis not present

## 2017-03-18 DIAGNOSIS — R197 Diarrhea, unspecified: Secondary | ICD-10-CM

## 2017-03-18 DIAGNOSIS — E871 Hypo-osmolality and hyponatremia: Secondary | ICD-10-CM | POA: Diagnosis not present

## 2017-03-18 DIAGNOSIS — F329 Major depressive disorder, single episode, unspecified: Secondary | ICD-10-CM | POA: Diagnosis not present

## 2017-03-18 DIAGNOSIS — F32A Depression, unspecified: Secondary | ICD-10-CM

## 2017-03-18 LAB — URINALYSIS, MICROSCOPIC ONLY

## 2017-03-18 LAB — POCT URINALYSIS DIPSTICK
BILIRUBIN UA: NEGATIVE
Glucose, UA: NEGATIVE
Ketones, UA: NEGATIVE
Nitrite, UA: NEGATIVE
PH UA: 7 (ref 5.0–8.0)
Protein, UA: NEGATIVE
Urobilinogen, UA: 0.2 E.U./dL

## 2017-03-18 LAB — SODIUM: SODIUM: 136 meq/L (ref 135–145)

## 2017-03-18 NOTE — Progress Notes (Signed)
Pre-visit discussion using our clinic review tool. No additional management support is needed unless otherwise documented below in the visit note.  

## 2017-03-18 NOTE — Progress Notes (Signed)
Patient ID: Kathryn Friedman, female   DOB: 1956-04-01, 61 y.o.   MRN: [id number]   Subjective:    Patient ID: Kathryn Friedman, female    DOB: Nov 18, 1956, 61 y.o.   MRN: [id number]  HPI  Patient here for a scheduled follow up.  She reports she is doing relatively well.  Has moved in with her boyfriend.  This is going well.  A lot of stress with the move.  Breathing overall stable.  No chest pain.  No acid reflux.  Still has episodes of diarrhea.  Intermittent flares.  Colestipol is helping.  No blood.  No abdominal pain.  States interested in looking into disability.     Past Medical History:  Diagnosis Date  . Anemia   . Arthritis    back and knees  . Asthma   . COPD (chronic obstructive pulmonary disease) ([place])   . Coronary artery disease    s/p stent placement 06/25/06  . Depression    secondary to the death of her husband (died 11)  . Diabetes mellitus without complication ([place]) Diagnosed 05/2008  . Diverticulitis   . GERD (gastroesophageal reflux disease)   . Hypertension   . Hypertriglyceridemia   . Sleep apnea    on CPAP  . Spastic colon    Past Surgical History:  Procedure Laterality Date  . ABDOMINAL HYSTERECTOMY  with left ovary in place 1996  . APPENDECTOMY  1985  . CESAREAN SECTION  1984  . CHOLECYSTECTOMY  1985  . COLONOSCOPY WITH PROPOFOL N/A 09/13/2016   Procedure: COLONOSCOPY WITH PROPOFOL;  Surgeon: Manya Silvas, MD;  Location: [facility] ENDOSCOPY;  Service: Endoscopy;  Laterality: N/A;  . KNEE ARTHROSCOPY  Arthroscopic left knee surgery   . KNEE SURGERY  status post knee surgey   . REPLACEMENT TOTAL KNEE  ([facility])  . SHOULDER SURGERY  shoulder operation secondary to a torn tendon   Family History  Problem Relation Age of Onset  . Other Mother     Hit by a fire truck and has had multiple operations on her back , and has history of MVP   . Mitral valve prolapse Mother   . Heart disease Father     myocardial infarction and is status post bypass surgery    . Mitral valve prolapse Sister   . Hepatitis C Brother   . Cirrhosis Brother   . Colon cancer Paternal Aunt    Social History   Social History  . Marital status: Widowed    Spouse name: N/A  . Number of children: 1  . Years of education: N/A   Occupational History  .  Nti   Social History Main Topics  . Smoking status: Current Every Day Smoker    Packs/day: 1.50    Years: 45.00    Types: Cigarettes  . Smokeless tobacco: Never Used  . Alcohol use No  . Drug use: No  . Sexual activity: Not Asked   Other Topics Concern  . None   Social History Narrative  . None    Outpatient Encounter Prescriptions as of 03/18/2017  Medication Sig  . acetaminophen (TYLENOL) 500 MG tablet Take 500 mg by mouth every 6 (six) hours as needed for pain.  Marland Kitchen albuterol (VENTOLIN HFA) 108 (90 BASE) MCG/ACT inhaler Inhale 2 puffs into the lungs every 4 (four) hours as needed. (Patient taking differently: Inhale 2 puffs into the lungs every 4 (four) hours as needed. 2 puffs twice daily)  . amLODipine (NORVASC) 5 MG tablet  TAKE ONE TABLET BY MOUTH EVERY DAY  . aspirin 81 MG tablet Take 81 mg by mouth daily. Take 1 tablet by mouth once a day as directed  . atorvastatin (LIPITOR) 40 MG tablet Take 1 tablet (40 mg total) by mouth daily.  . Brexpiprazole (REXULTI) 2 MG TABS Take 2 mg by mouth daily.  . Calcium Carbonate-Vitamin D (CALCIUM 600 + D PO) Take 600 mg by mouth daily.  . citalopram (CELEXA) 20 MG tablet Take 20 mg by mouth at bedtime.  . clopidogrel (PLAVIX) 75 MG tablet TAKE ONE TABLET BY MOUTH EVERY DAY  . colestipol (COLESTID) 1 g tablet Take 2 g by mouth daily.  . cyclobenzaprine (FLEXERIL) 10 MG tablet TAKE ONE TABLET BY MOUTH 3 TIMES A DAY AS NEEDED FOR MUSCLE SPASMS  . DULoxetine (CYMBALTA) 60 MG capsule Take 60 mg by mouth daily.  . Fluticasone-Salmeterol (ADVAIR) 250-50 MCG/DOSE AEPB Inhale 1 puff into the lungs every 12 (twelve) hours.  Marland Kitchen glucose blood test strip One Touch Ultra Test  Strips. Check blood sugar bid  . metFORMIN (GLUCOPHAGE XR) 500 MG 24 hr tablet Take two tablets bid  . naproxen (NAPROSYN) 500 MG tablet Take 1 tablet (500 mg total) by mouth 2 (two) times daily with a meal.  . pantoprazole (PROTONIX) 40 MG tablet Take 1 tablet (40 mg total) by mouth daily.  . propranolol (INDERAL) 20 MG tablet TAKE TWO TABLETS BY MOUTH 2 TIMES A DAY.  . traZODone (DESYREL) 100 MG tablet Take 200 mg by mouth at bedtime.    No facility-administered encounter medications on file as of 03/18/2017.     Review of Systems  Constitutional: Negative for appetite change and unexpected weight change.  HENT: Negative for congestion and sinus pressure.   Respiratory: Negative for cough, chest tightness and shortness of breath.   Gastrointestinal: Negative for abdominal pain, nausea and vomiting.       Intermittent episodes of diarrhea.  Flares intermittently.    Genitourinary: Negative for difficulty urinating and dysuria.  Musculoskeletal: Negative for joint swelling and myalgias.  Skin: Negative for color change and rash.  Neurological: Negative for dizziness, light-headedness and headaches.  Psychiatric/Behavioral: Negative for agitation and dysphoric mood.       Objective:     Blood pressure rechecked by me:  122/64  Physical Exam  Constitutional: She appears well-developed and well-nourished. No distress.  HENT:  Nose: Nose normal.  Mouth/Throat: Oropharynx is clear and moist.  Neck: Neck supple. No thyromegaly present.  Cardiovascular: Normal rate and regular rhythm.   Pulmonary/Chest: Breath sounds normal. No respiratory distress. She has no wheezes.  Abdominal: Soft. Bowel sounds are normal. There is no tenderness.  Musculoskeletal: She exhibits no edema or tenderness.  Lymphadenopathy:    She has no cervical adenopathy.  Skin: No rash noted. No erythema.  Psychiatric: She has a normal mood and affect. Her behavior is normal.    BP 122/64   Pulse 73   Temp  98.6 F (37 C) (Oral)   Resp 12   Ht _0  (1.626 m)   Wt 229 lb 12.8 oz (104.2 kg)   SpO2 94%   BMI 39.45 kg/m  Wt Readings from Last 3 Encounters:  03/18/17 229 lb 12.8 oz (104.2 kg)  02/07/17 228 lb 3.2 oz (103.5 kg)  09/13/16 227 lb (103 kg)     Lab Results  Component Value Date   WBC 9.4 07/10/2016   HGB 12.3 07/10/2016   HCT 38.0 07/10/2016  PLT 244.0 07/10/2016   GLUCOSE 89 02/07/2017   CHOL 105 02/07/2017   TRIG 116.0 02/07/2017   HDL 41.00 02/07/2017   LDLCALC 41 02/07/2017   ALT 10 02/07/2017   AST 16 02/07/2017   NA 136 03/18/2017   K 4.2 02/07/2017   CL 101 02/07/2017   CREATININE 0.72 02/07/2017   BUN 7 02/07/2017   CO2 27 02/07/2017   TSH 0.95 02/07/2017   HGBA1C 6.7 (H) 02/07/2017   MICROALBUR <0.7 06/14/2016    Ct Chest Wo Contrast  Result Date: 12/31/2016 CLINICAL DATA:  Follow-up mild thoracic adenopathy on prior screening chest CT. EXAM: CT CHEST WITHOUT CONTRAST TECHNIQUE: Multidetector CT imaging of the chest was performed following the standard protocol without IV contrast. COMPARISON:  08/16/2016 screening chest CT. FINDINGS: Cardiovascular: Normal heart size. No significant pericardial fluid/thickening. Left main, left anterior descending, left circumflex and right coronary atherosclerosis. Atherosclerotic nonaneurysmal thoracic aorta. Normal caliber pulmonary arteries. Mediastinum/Nodes: No convincing discrete thyroid nodules. Unremarkable esophagus. No axillary adenopathy. Mild right paratracheal lymphadenopathy measuring up to 1.3 cm (series 2/ image 43), stable. Mildly enlarged 1.5 cm subcarinal node (series 2/ image 59), stable. Stable mild bilateral hilar fullness suggesting mild bilateral hilar adenopathy, poorly delineated on this noncontrast study. No new pathologically enlarged mediastinal or gross hilar nodes. Lungs/Pleura: No pneumothorax. No pleural effusion. No acute consolidative airspace disease or lung masses. There is extensive patchy  confluent ground-glass centrilobular micronodularity throughout both lungs, upper lobe predominant with relative sparing of the lung bases. There is associated patchy interlobular septal thickening. No significant regions of traction bronchiectasis, architectural distortion or frank honeycombing. These findings have not significantly changed. Upper abdomen: Cholecystectomy. Musculoskeletal: No aggressive appearing focal osseous lesions. Moderate thoracic spondylosis. IMPRESSION: 1. 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 2. Interval stability of mild mediastinal and bilateral hilar lymphadenopathy, suggesting benign reactive adenopathy. This adenopathy can be reassessed on follow-up screening chest CT. 3. Aortic atherosclerosis. Left main and 3 vessel coronary atherosclerosis. Electronically Signed   By: Ilona Sorrel M.D.   On: 12/31/2016 11:12       Assessment & Plan:   Problem List Items Addressed This Visit    CAD (coronary artery disease)    s/p stent placement.  On plavix.  Has seen cardiology.  Continue risk factor modificaiton.        COPD (chronic obstructive pulmonary disease) ([facility])    Breathing stable.  Continue inhalers.        Depression    Stable on citalopram.        Diabetes ([place])    Low carb diet and exercise.  Follow met b and a1c.        Relevant Orders   Hemoglobin A1c   Microalbumin / creatinine urine ratio   Basic metabolic panel   Diarrhea    Has seen GI.  On colestipol.  Doing better.  Still with intermittent flares.  Follow.  Requested form to initiate possible disability.   Explained I did not determine disability.  Will complete form with information requested.        Essential hypertension, benign    Blood pressure on recheck improved.  Continue same medication regimen.  Follow pressures.  Follow  metabolic panel.        Relevant Orders   CBC with Differential/Platelet   GERD (gastroesophageal reflux disease)    On protonix.  Controlled.        Hypercholesterolemia    On lipitor 40 mg q day.  Low cholesterol diet and exercise.  Follow lipid panel and liver function tests.        Relevant Orders   Lipid panel   Hepatic function panel   Obstructive sleep apnea    CPAP.       Personal history of tobacco use, presenting hazards to health    Discussed the need to quit smoking.  She desires not to stop at this time.  Follow.         Other Visit Diagnoses    Hyponatremia       Relevant Orders   Urine Culture (Completed)   POCT Urinalysis Dipstick (Completed)   Urine Microscopic Only (Completed)       Einar Pheasant, MD

## 2017-03-20 ENCOUNTER — Telehealth: Payer: Self-pay | Admitting: Internal Medicine

## 2017-03-20 ENCOUNTER — Telehealth: Payer: Self-pay

## 2017-03-20 ENCOUNTER — Other Ambulatory Visit: Payer: Self-pay

## 2017-03-20 LAB — URINE CULTURE

## 2017-03-20 LAB — HM DIABETES EYE EXAM

## 2017-03-20 MED ORDER — CEFDINIR 300 MG PO CAPS: 300.0000 mg | ORAL_CAPSULE | Freq: Two times a day (BID) | ORAL | 0 refills | Status: DC

## 2017-03-20 NOTE — Telephone Encounter (Signed)
Left message to return call to our office.  

## 2017-03-20 NOTE — Telephone Encounter (Signed)
-----   Message from Einar Pheasant, MD sent at 03/20/2017  3:30 PM EDT ----- Notify pt that her urine culture is positive for infection.  Confirm nkda.  If no, then send in rx for omnicef 300mg  bid x 1 week.

## 2017-03-20 NOTE — Telephone Encounter (Signed)
See other message

## 2017-03-20 NOTE — Telephone Encounter (Signed)
Pt called back returning your call. Thank you!  Call pt @ [phone]

## 2017-03-20 NOTE — Telephone Encounter (Signed)
Called patient she does not have any drug allergies I have called in the abx to pharmacy she requested.

## 2017-03-24 ENCOUNTER — Encounter: Payer: Self-pay | Admitting: Internal Medicine

## 2017-03-24 ENCOUNTER — Other Ambulatory Visit: Payer: Self-pay | Admitting: Internal Medicine

## 2017-03-24 NOTE — Assessment & Plan Note (Signed)
Low carb diet and exercise.  Follow met b and a1c.   

## 2017-03-24 NOTE — Assessment & Plan Note (Signed)
Stable on citalopram.  

## 2017-03-24 NOTE — Assessment & Plan Note (Signed)
Blood pressure on recheck improved.  Continue same medication regimen.  Follow pressures.  Follow metabolic panel.   

## 2017-03-24 NOTE — Assessment & Plan Note (Signed)
CPAP.  

## 2017-03-24 NOTE — Assessment & Plan Note (Signed)
On lipitor 40 mg q day.  Low cholesterol diet and exercise.  Follow lipid panel and liver function tests.

## 2017-03-24 NOTE — Assessment & Plan Note (Signed)
On protonix.  Controlled.   

## 2017-03-24 NOTE — Assessment & Plan Note (Signed)
s/p stent placement.  On plavix.  Has seen cardiology.  Continue risk factor modificaiton.

## 2017-03-24 NOTE — Assessment & Plan Note (Signed)
Discussed the need to quit smoking.  She desires not to stop at this time.  Follow.

## 2017-03-24 NOTE — Assessment & Plan Note (Signed)
Has seen GI.  On colestipol.  Doing better.  Still with intermittent flares.  Follow.  Requested form to initiate possible disability.  Explained I did not determine disability.  Will complete form with information requested.

## 2017-03-24 NOTE — Assessment & Plan Note (Signed)
Breathing stable.  Continue inhalers.   

## 2017-03-26 ENCOUNTER — Telehealth: Payer: Self-pay | Admitting: Internal Medicine

## 2017-03-26 NOTE — Telephone Encounter (Signed)
Pt called to follow up on disability form that was dropped off last Tuesday. Please advise?  Call pt @ [phone]. Thank you!

## 2017-03-26 NOTE — Telephone Encounter (Signed)
Form has been completed.  In your box.

## 2017-03-26 NOTE — Telephone Encounter (Signed)
Please advise do you have

## 2017-03-26 NOTE — Telephone Encounter (Signed)
Called patient she requested it be mailed I have sent out original and copy sent to scan.

## 2017-04-03 ENCOUNTER — Telehealth: Payer: Self-pay | Admitting: Pharmacy Technician

## 2017-04-03 NOTE — Telephone Encounter (Signed)
Spoke with patient regarding providing check stubs.  Patient stated that she is no longer employed.  Boyfriend is providing support.  Letter of support and insurance attestation letter mailed to patient.  Made patient aware that we needed poi within 30 days to prevent a delay in receiving medication assistance from [facility].  Patient acknowledged that she understood.  [facility]

## 2017-04-14 ENCOUNTER — Other Ambulatory Visit: Payer: Self-pay | Admitting: Internal Medicine

## 2017-05-01 ENCOUNTER — Telehealth: Payer: Self-pay | Admitting: Pharmacy Technician

## 2017-05-01 NOTE — Telephone Encounter (Signed)
Patient eligible to receive medication assistance at [facility] until 12/26/17 as long as eligibility requirements continue to be met.  Quantrell Splitt J. Krystena Reitter Care Manager [facility] 

## 2017-05-12 ENCOUNTER — Other Ambulatory Visit: Payer: Self-pay | Admitting: Internal Medicine

## 2017-05-15 ENCOUNTER — Telehealth: Payer: Self-pay | Admitting: Internal Medicine

## 2017-05-15 NOTE — Telephone Encounter (Signed)
Medication management dropped off form to be signed. Please advise Anne Ng at [phone] when completed. Placed in Dr. Nicki Reaper colored folder upfront

## 2017-05-16 NOTE — Telephone Encounter (Signed)
Put in your folder for signature

## 2017-05-18 NOTE — Telephone Encounter (Signed)
Signed and placed in box.   

## 2017-05-19 NOTE — Telephone Encounter (Signed)
Called office and let them know here for pick up under pt name. No answer l/m message on v/m for [employer].

## 2017-05-21 ENCOUNTER — Telehealth: Payer: Self-pay | Admitting: Pharmacist

## 2017-05-21 NOTE — Telephone Encounter (Signed)
05/21/17 Called [facility] spoke with Purcell Nails and placed Last Refill on Rexulti 2mg . Patient will need to Re Enroll before next order-enrollment ends 06/19/17.Delos Haring

## 2017-06-02 ENCOUNTER — Telehealth: Payer: Self-pay | Admitting: Pharmacist

## 2017-06-02 ENCOUNTER — Other Ambulatory Visit: Payer: Self-pay | Admitting: Internal Medicine

## 2017-06-02 NOTE — Telephone Encounter (Signed)
06/02/17 Faxed Re Enrollment application to [place] for Ventolin HFA 90 mcg Inhale 2 puffs four times a day, also Advair 250/50 Inhale 1 puff two times a day.Delos Haring

## 2017-06-05 ENCOUNTER — Telehealth: Payer: Self-pay | Admitting: Pharmacist

## 2017-06-05 NOTE — Telephone Encounter (Signed)
06/05/17 Placed refills online with [facility] for Adair 250/50 & Ventolin HFA, they will release 06/18/17.Delos Haring

## 2017-07-07 ENCOUNTER — Other Ambulatory Visit: Payer: Self-pay | Admitting: Internal Medicine

## 2017-07-14 ENCOUNTER — Other Ambulatory Visit: Payer: Self-pay | Admitting: Internal Medicine

## 2017-08-04 ENCOUNTER — Telehealth: Payer: Self-pay | Admitting: Pharmacist

## 2017-08-04 NOTE — Telephone Encounter (Signed)
08/04/17 Placed refill for Ventolin HFA & Advair 250/50 online with [facility], will release 09/01/17, order# [id number].Delos Haring

## 2017-08-11 ENCOUNTER — Other Ambulatory Visit: Payer: Self-pay | Admitting: Internal Medicine

## 2017-08-14 ENCOUNTER — Encounter: Payer: Self-pay | Admitting: Internal Medicine

## 2017-08-14 ENCOUNTER — Ambulatory Visit (INDEPENDENT_AMBULATORY_CARE_PROVIDER_SITE_OTHER): Payer: Self-pay | Admitting: Internal Medicine

## 2017-08-14 VITALS — BP 140/90 | HR 91 | Temp 98.6°F | Resp 14 | Ht 64.0 in | Wt 256.0 lb

## 2017-08-14 DIAGNOSIS — Z87891 Personal history of nicotine dependence: Secondary | ICD-10-CM

## 2017-08-14 DIAGNOSIS — Z0000 Encounter for general adult medical examination without abnormal findings: Secondary | ICD-10-CM

## 2017-08-14 DIAGNOSIS — J449 Chronic obstructive pulmonary disease, unspecified: Secondary | ICD-10-CM

## 2017-08-14 DIAGNOSIS — G4733 Obstructive sleep apnea (adult) (pediatric): Secondary | ICD-10-CM

## 2017-08-14 DIAGNOSIS — F329 Major depressive disorder, single episode, unspecified: Secondary | ICD-10-CM

## 2017-08-14 DIAGNOSIS — E7800 Pure hypercholesterolemia, unspecified: Secondary | ICD-10-CM

## 2017-08-14 DIAGNOSIS — I10 Essential (primary) hypertension: Secondary | ICD-10-CM

## 2017-08-14 DIAGNOSIS — J849 Interstitial pulmonary disease, unspecified: Secondary | ICD-10-CM

## 2017-08-14 DIAGNOSIS — D509 Iron deficiency anemia, unspecified: Secondary | ICD-10-CM

## 2017-08-14 DIAGNOSIS — F32A Depression, unspecified: Secondary | ICD-10-CM

## 2017-08-14 DIAGNOSIS — E119 Type 2 diabetes mellitus without complications: Secondary | ICD-10-CM

## 2017-08-14 DIAGNOSIS — K219 Gastro-esophageal reflux disease without esophagitis: Secondary | ICD-10-CM

## 2017-08-14 DIAGNOSIS — I2510 Atherosclerotic heart disease of native coronary artery without angina pectoris: Secondary | ICD-10-CM

## 2017-08-14 DIAGNOSIS — R21 Rash and other nonspecific skin eruption: Secondary | ICD-10-CM

## 2017-08-14 DIAGNOSIS — Z23 Encounter for immunization: Secondary | ICD-10-CM

## 2017-08-14 LAB — BASIC METABOLIC PANEL
BUN: 11 mg/dL (ref 6–23)
CALCIUM: 9.2 mg/dL (ref 8.4–10.5)
CO2: 29 mEq/L (ref 19–32)
CREATININE: 0.79 mg/dL (ref 0.40–1.20)
Chloride: 101 mEq/L (ref 96–112)
GFR: 78.52 mL/min (ref >60.00)
GLUCOSE: 132 mg/dL — AB (ref 70–99)
POTASSIUM: 4.4 meq/L (ref 3.5–5.1)
Sodium: 134 mEq/L — ABNORMAL LOW (ref 135–145)

## 2017-08-14 LAB — CBC WITH DIFFERENTIAL/PLATELET
Basophils Absolute: 0 10*3/uL (ref 0.0–0.1)
Basophils Relative: 0.4 % (ref 0.0–3.0)
EOS ABS: 0.1 10*3/uL (ref 0.0–0.7)
EOS PCT: 1.6 % (ref 0.0–5.0)
HCT: 37.4 % (ref 36.0–46.0)
HEMOGLOBIN: 11.9 g/dL — AB (ref 12.0–15.0)
LYMPHS ABS: 1.9 10*3/uL (ref 0.7–4.0)
Lymphocytes Relative: 21.2 % (ref 12.0–46.0)
MCHC: 31.8 g/dL (ref 30.0–36.0)
MCV: 79.7 fl (ref 78.0–100.0)
MONO ABS: 0.4 10*3/uL (ref 0.1–1.0)
Monocytes Relative: 4.8 % (ref 3.0–12.0)
NEUTROS PCT: 72 % (ref 43.0–77.0)
Neutro Abs: 6.5 10*3/uL (ref 1.4–7.7)
PLATELETS: 188 10*3/uL (ref 150.0–400.0)
RBC: 4.69 Mil/uL (ref 3.87–5.11)
RDW: 19.2 % — AB (ref 11.5–15.5)
WBC: 9 10*3/uL (ref 4.0–10.5)

## 2017-08-14 LAB — HEPATIC FUNCTION PANEL
ALT: 21 U/L (ref 0–35)
AST: 23 U/L (ref 0–37)
Albumin: 3.9 g/dL (ref 3.5–5.2)
Alkaline Phosphatase: 72 U/L (ref 39–117)
BILIRUBIN DIRECT: 0 mg/dL (ref 0.0–0.3)
TOTAL PROTEIN: 7.1 g/dL (ref 6.0–8.3)
Total Bilirubin: 0.4 mg/dL (ref 0.2–1.2)

## 2017-08-14 LAB — MICROALBUMIN / CREATININE URINE RATIO
CREATININE, U: 23.3 mg/dL
Microalb Creat Ratio: 3 mg/g (ref 0.0–30.0)
Microalb, Ur: <0.7 mg/dL (ref 0.0–1.9)

## 2017-08-14 LAB — LIPID PANEL
CHOL/HDL RATIO: 2
CHOLESTEROL: 114 mg/dL (ref 0–200)
HDL: 52.8 mg/dL (ref >39.00)
LDL Cholesterol: 43 mg/dL (ref 0–99)
NonHDL: 61.54
TRIGLYCERIDES: 93 mg/dL (ref 0.0–149.0)
VLDL: 18.6 mg/dL (ref 0.0–40.0)

## 2017-08-14 LAB — HEMOGLOBIN A1C: Hgb A1c MFr Bld: 7.3 % — ABNORMAL HIGH (ref 4.6–6.5)

## 2017-08-14 MED ORDER — NYSTATIN 100000 UNIT/GM EX CREA: 1.0000 "application " | TOPICAL_CREAM | Freq: Two times a day (BID) | CUTANEOUS | 0 refills | Status: DC

## 2017-08-14 MED ORDER — NYSTATIN 100000 UNIT/GM EX POWD: Freq: Two times a day (BID) | CUTANEOUS | 0 refills | Status: DC

## 2017-08-14 NOTE — Assessment & Plan Note (Signed)
Physical today 08/14/17.  Mammogram 06/17/16 - Biras I.  She will call and schedule f/u mammogram.  She has to call - through [place].  Colonoscopy 09/13/16 as outlined.  Recommend f/u colonoscopy in 08/2019.

## 2017-08-14 NOTE — Progress Notes (Signed)
Patient ID: Kathryn Friedman, female   DOB: 04/17/1956, 61 y.o.   MRN: [id number]   Subjective:    Patient ID: Kathryn Friedman, female    DOB: November 08, 1956, 61 y.o.   MRN: [id number]  HPI  Patient here for a her physical exam.  She reports she is doing relatively well.  Planning to get married 08/30/17.  Relationship going well.  Discussed her elevated PHQ score.  She states she is doing better.  Is seeing psychiatry.  Overall feels better.  No suicidal thoughts.  Trying to stay active.  Does not do regular exercise.  Discussed diet and exercise.  No chest pain.  Breathing stable.  No acid reflux reported.  No abdominal pain.  Bowels moving.  No significant diarrhea reported.  Saw GI.  Had colonoscopy 08/2016.  Feels stable.  Previous CT lungs recommended repeat.  F/u CT as outlined.  ILD and interval stability of mild mediastinal and bilateral hilar lymphadenopathy - suggestive of benign reactive adenopathy.  See report.  Overall she feels things are stable.     Past Medical History:  Diagnosis Date  . Anemia   . Arthritis    back and knees  . Asthma   . COPD (chronic obstructive pulmonary disease) ([place])   . Coronary artery disease    s/p stent placement 06/25/06  . Depression    secondary to the death of her husband (died 69)  . Diabetes mellitus without complication ([place]) Diagnosed 05/2008  . Diverticulitis   . GERD (gastroesophageal reflux disease)   . Hypertension   . Hypertriglyceridemia   . Sleep apnea    on CPAP  . Spastic colon    Past Surgical History:  Procedure Laterality Date  . ABDOMINAL HYSTERECTOMY  with left ovary in place 1996  . APPENDECTOMY  1985  . CESAREAN SECTION  1984  . CHOLECYSTECTOMY  1985  . COLONOSCOPY WITH PROPOFOL N/A 09/13/2016   Procedure: COLONOSCOPY WITH PROPOFOL;  Surgeon: Manya Silvas, MD;  Location: [facility] ENDOSCOPY;  Service: Endoscopy;  Laterality: N/A;  . KNEE ARTHROSCOPY  Arthroscopic left knee surgery   . KNEE SURGERY  status post  knee surgey   . REPLACEMENT TOTAL KNEE  ([facility])  . SHOULDER SURGERY  shoulder operation secondary to a torn tendon   Family History  Problem Relation Age of Onset  . Other Mother        Hit by a fire truck and has had multiple operations on her back , and has history of MVP   . Mitral valve prolapse Mother   . Heart disease Father        myocardial infarction and is status post bypass surgery  . Mitral valve prolapse Sister   . Hepatitis C Brother   . Cirrhosis Brother   . Colon cancer Paternal Aunt    Social History   Social History  . Marital status: Widowed    Spouse name: N/A  . Number of children: 1  . Years of education: N/A   Occupational History  .  [employer]   Social History Main Topics  . Smoking status: Current Every Day Smoker    Packs/day: 1.50    Years: 45.00    Types: Cigarettes  . Smokeless tobacco: Never Used  . Alcohol use No  . Drug use: No  . Sexual activity: Not Asked   Other Topics Concern  . None   Social History Narrative  . None    Outpatient Encounter Prescriptions as of 08/14/2017  Medication Sig  . acetaminophen (TYLENOL) 500 MG tablet Take 500 mg by mouth every 6 (six) hours as needed for pain.  Marland Kitchen albuterol (VENTOLIN HFA) 108 (90 BASE) MCG/ACT inhaler Inhale 2 puffs into the lungs every 4 (four) hours as needed. (Patient taking differently: Inhale 2 puffs into the lungs every 4 (four) hours as needed. 2 puffs twice daily)  . amLODipine (NORVASC) 5 MG tablet TAKE ONE TABLET BY MOUTH EVERY DAY  . aspirin 81 MG tablet Take 81 mg by mouth daily. Take 1 tablet by mouth once a day as directed  . atorvastatin (LIPITOR) 40 MG tablet TAKE ONE TABLET BY MOUTH EVERY EVENING  . Brexpiprazole (REXULTI) 2 MG TABS Take 2 mg by mouth daily.  . Calcium Carbonate-Vitamin D (CALCIUM 600 + D PO) Take 600 mg by mouth daily.  . cefdinir (OMNICEF) 300 MG capsule Take 1 capsule (300 mg total) by mouth 2 (two) times daily.  . citalopram (CELEXA) 20 MG tablet Take 20  mg by mouth at bedtime.  . clopidogrel (PLAVIX) 75 MG tablet TAKE ONE TABLET BY MOUTH EVERY DAY  . colestipol (COLESTID) 1 g tablet Take 2 g by mouth daily.  . cyclobenzaprine (FLEXERIL) 10 MG tablet TAKE ONE TABLET BY MOUTH 3 TIMES A DAY AS NEEDED FOR MUSCLE SPASMS  . DULoxetine (CYMBALTA) 60 MG capsule Take 60 mg by mouth daily.  . Fluticasone-Salmeterol (ADVAIR) 250-50 MCG/DOSE AEPB Inhale 1 puff into the lungs every 12 (twelve) hours.  Marland Kitchen glucose blood test strip One Touch Ultra Test Strips. Check blood sugar bid  . metFORMIN (GLUCOPHAGE-XR) 500 MG 24 hr tablet TAKE TWO TABLETS (1000MG) BY MOUTH 2 TIMES A DAY  . naproxen (NAPROSYN) 500 MG tablet Take 1 tablet (500 mg total) by mouth 2 (two) times daily with a meal.  . nystatin (NYSTATIN) powder Apply topically 2 (two) times daily.  Marland Kitchen nystatin cream (MYCOSTATIN) Apply 1 application topically 2 (two) times daily.  . pantoprazole (PROTONIX) 40 MG tablet TAKE ONE TABLET BY MOUTH EVERY DAY  . propranolol (INDERAL) 20 MG tablet TAKE TWO TABLETS BY MOUTH 2 TIMES A DAY.  Marland Kitchen propranolol (INDERAL) 40 MG tablet TAKE 1 TABLET BY MOUTH 2 TIMES A DAY.  . traZODone (DESYREL) 100 MG tablet Take 200 mg by mouth at bedtime.   . [DISCONTINUED] nystatin (NYSTATIN) powder Apply topically 2 (two) times daily.  . [DISCONTINUED] nystatin cream (MYCOSTATIN) Apply 1 application topically 2 (two) times daily.   No facility-administered encounter medications on file as of 08/14/2017.     Review of Systems  Constitutional: Negative for appetite change and unexpected weight change.  HENT: Negative for congestion and sinus pressure.   Eyes: Negative for pain and visual disturbance.  Respiratory: Negative for cough and chest tightness.        Breathing stable.    Cardiovascular: Negative for chest pain and palpitations.  Gastrointestinal: Negative for abdominal pain, diarrhea, nausea and vomiting.  Genitourinary: Negative for difficulty urinating and dysuria.    Musculoskeletal: Negative for joint swelling and myalgias.  Skin: Negative for color change and rash.  Neurological: Negative for dizziness, light-headedness and headaches.  Hematological: Negative for adenopathy. Does not bruise/bleed easily.  Psychiatric/Behavioral: Negative for agitation and dysphoric mood.       Objective:    Physical Exam  Constitutional: She is oriented to person, place, and time. She appears well-developed and well-nourished. No distress.  HENT:  Nose: Nose normal.  Mouth/Throat: Oropharynx is clear and moist.  Eyes: Right eye  exhibits no discharge. Left eye exhibits no discharge. No scleral icterus.  Neck: Neck supple. No thyromegaly present.  Cardiovascular: Normal rate and regular rhythm.   Pulmonary/Chest: Breath sounds normal. No accessory muscle usage. No tachypnea. No respiratory distress. She has no decreased breath sounds. She has no wheezes. She has no rhonchi. Right breast exhibits no inverted nipple, no mass, no nipple discharge and no tenderness (no axillary adenopathy). Left breast exhibits no inverted nipple, no mass, no nipple discharge and no tenderness (no axilarry adenopathy).  Abdominal: Soft. Bowel sounds are normal. There is no tenderness.  Musculoskeletal: She exhibits no edema or tenderness.  Lymphadenopathy:    She has no cervical adenopathy.  Neurological: She is alert and oriented to person, place, and time.  Skin: Skin is warm. No rash noted. No erythema.  Erythematous based rash - under both breasts and inguinal area.    Psychiatric: She has a normal mood and affect. Her behavior is normal.    BP 140/90 (BP Location: Left Arm, Patient Position: Sitting, Cuff Size: Large)   Pulse 91   Temp 98.6 F (37 C) (Oral)   Resp 14   Ht '5\' 4"'  (1.626 m)   Wt 256 lb (116.1 kg)   SpO2 95%   BMI 43.94 kg/m  Wt Readings from Last 3 Encounters:  08/14/17 256 lb (116.1 kg)  03/18/17 229 lb 12.8 oz (104.2 kg)  02/07/17 228 lb 3.2 oz (103.5  kg)     Lab Results  Component Value Date   WBC 9.0 08/14/2017   HGB 11.9 (L) 08/14/2017   HCT 37.4 08/14/2017   PLT 188.0 08/14/2017   GLUCOSE 132 (H) 08/14/2017   CHOL 114 08/14/2017   TRIG 93.0 08/14/2017   HDL 52.80 08/14/2017   LDLCALC 43 08/14/2017   ALT 21 08/14/2017   AST 23 08/14/2017   NA 134 (L) 08/14/2017   K 4.4 08/14/2017   CL 101 08/14/2017   CREATININE 0.79 08/14/2017   BUN 11 08/14/2017   CO2 29 08/14/2017   TSH 0.95 02/07/2017   HGBA1C 7.3 (H) 08/14/2017   MICROALBUR <0.7 08/14/2017    Ct Chest Wo Contrast  Result Date: 12/31/2016 CLINICAL DATA:  Follow-up mild thoracic adenopathy on prior screening chest CT. EXAM: CT CHEST WITHOUT CONTRAST TECHNIQUE: Multidetector CT imaging of the chest was performed following the standard protocol without IV contrast. COMPARISON:  08/16/2016 screening chest CT. FINDINGS: Cardiovascular: Normal heart size. No significant pericardial fluid/thickening. Left main, left anterior descending, left circumflex and right coronary atherosclerosis. Atherosclerotic nonaneurysmal thoracic aorta. Normal caliber pulmonary arteries. Mediastinum/Nodes: No convincing discrete thyroid nodules. Unremarkable esophagus. No axillary adenopathy. Mild right paratracheal lymphadenopathy measuring up to 1.3 cm (series 2/ image 43), stable. Mildly enlarged 1.5 cm subcarinal node (series 2/ image 59), stable. Stable mild bilateral hilar fullness suggesting mild bilateral hilar adenopathy, poorly delineated on this noncontrast study. No new pathologically enlarged mediastinal or gross hilar nodes. Lungs/Pleura: No pneumothorax. No pleural effusion. No acute consolidative airspace disease or lung masses. There is extensive patchy confluent ground-glass centrilobular micronodularity throughout both lungs, upper lobe predominant with relative sparing of the lung bases. There is associated patchy interlobular septal thickening. No significant regions of traction  bronchiectasis, architectural distortion or frank honeycombing. These findings have not significantly changed. Upper abdomen: Cholecystectomy. Musculoskeletal: No aggressive appearing focal osseous lesions. Moderate thoracic spondylosis. IMPRESSION: 1. Extensive patchy confluent upper lobe predominant ground-glass centrilobular micronodularity in both lungs, not appreciably changed, compatible with smoking related interstitial lung disease in  this patient who is a current smoker per epic (respiratory bronchiolitis if the patient has no current respiratory symptoms versus respiratory bronchiolitis -interstitial lung disease (RB -ILD) if the patient has current respiratory symptoms). 2. Interval stability of mild mediastinal and bilateral hilar lymphadenopathy, suggesting benign reactive adenopathy. This adenopathy can be reassessed on follow-up screening chest CT. 3. Aortic atherosclerosis. Left main and 3 vessel coronary atherosclerosis. Electronically Signed   By: Ilona Sorrel M.D.   On: 12/31/2016 11:12       Assessment & Plan:   Problem List Items Addressed This Visit    CAD (coronary artery disease)    S/p stent placement.  Has seen cardiology.  Continue risk factor modification.        COPD (chronic obstructive pulmonary disease) ([facility])    Breathing stable.  Continue current inhalers.        Depression    Seeing psychiatry.  Feels doing better.  Continue current medication regimen.        Diabetes ([place])    Low carb diet and exercise.  Discussed with her today.  Keep up to date with eye checks.  Follow met b and a1c.        Essential hypertension, benign    Slight increase today.  Have her spot check her pressure.  Get her back in soon to reassess.  Check metabolic panel.        GERD (gastroesophageal reflux disease)    On protonix.  Controlled.        Healthcare maintenance    Physical today 08/14/17.  Mammogram 06/17/16 - Biras I.  She will call and schedule f/u mammogram.  She has  to call - through [place].  Colonoscopy 09/13/16 as outlined.  Recommend f/u colonoscopy in 08/2019.        Hypercholesterolemia    On lipitor.  Low cholesterol diet and exercise.  Follow lipid panel and liver function tests.        ILD (interstitial lung disease) ([place])    See CT.  Feels breathing stable.  Follow.        Iron deficiency anemia    Has seen GI.  Recheck cbc today.        Obstructive sleep apnea    CPAP.       Personal history of tobacco use, presenting hazards to health    Discussed with her today.  Discussed the need to quit smoking.  She declines.         Other Visit Diagnoses    Rash    -  Primary   Appears to be c/w yeast infection.  Nystatin cream and powder as directed.  Follow.    Need for immunization against influenza       Relevant Orders   Flu Vaccine QUAD 36+ mos IM (Completed)       Einar Pheasant, MD

## 2017-08-15 ENCOUNTER — Other Ambulatory Visit: Payer: Self-pay | Admitting: Internal Medicine

## 2017-08-15 DIAGNOSIS — D649 Anemia, unspecified: Secondary | ICD-10-CM

## 2017-08-15 NOTE — Progress Notes (Signed)
Order placed for f/u labs.  

## 2017-08-17 ENCOUNTER — Encounter: Payer: Self-pay | Admitting: Internal Medicine

## 2017-08-17 DIAGNOSIS — J849 Interstitial pulmonary disease, unspecified: Secondary | ICD-10-CM | POA: Insufficient documentation

## 2017-08-17 NOTE — Assessment & Plan Note (Signed)
See CT.  Feels breathing stable.  Follow.

## 2017-08-17 NOTE — Assessment & Plan Note (Signed)
S/p stent placement.  Has seen cardiology.  Continue risk factor modification.

## 2017-08-17 NOTE — Assessment & Plan Note (Signed)
Discussed with her today.  Discussed the need to quit smoking.  She declines.

## 2017-08-17 NOTE — Assessment & Plan Note (Signed)
On protonix.  Controlled.   

## 2017-08-17 NOTE — Assessment & Plan Note (Signed)
Low carb diet and exercise.  Discussed with her today.  Keep up to date with eye checks.  Follow met b and a1c.

## 2017-08-17 NOTE — Assessment & Plan Note (Signed)
Slight increase today.  Have her spot check her pressure.  Get her back in soon to reassess.  Check metabolic panel.

## 2017-08-17 NOTE — Assessment & Plan Note (Signed)
On lipitor.  Low cholesterol diet and exercise.  Follow lipid panel and liver function tests.   

## 2017-08-17 NOTE — Assessment & Plan Note (Signed)
Breathing stable.  Continue current inhalers.

## 2017-08-17 NOTE — Assessment & Plan Note (Signed)
CPAP.  

## 2017-08-17 NOTE — Assessment & Plan Note (Signed)
Has seen GI.  Recheck cbc today.

## 2017-08-17 NOTE — Assessment & Plan Note (Signed)
Seeing psychiatry.  Feels doing better.  Continue current medication regimen.

## 2017-08-18 ENCOUNTER — Encounter: Payer: Self-pay | Admitting: Internal Medicine

## 2017-08-18 ENCOUNTER — Other Ambulatory Visit: Payer: Self-pay | Admitting: Internal Medicine

## 2017-08-18 NOTE — Telephone Encounter (Signed)
I am ok to give her another meter.  May sure is calibrated (if needs to be) and make sure she is using correctly - can schedule a nurse visit.  Check and record and send in readings over the next two weeks.  I can then see what sugars are averaging and the better tell what medications are needed.

## 2017-08-19 NOTE — Telephone Encounter (Signed)
Patient is requesting extra strips from the office , pt can not afford test strip.  Pt contact [phone]

## 2017-08-20 NOTE — Telephone Encounter (Signed)
Called patient states that she knows how to use now she does not want nurse visit but will come by and pick up one touch. She will call if any question

## 2017-08-20 NOTE — Telephone Encounter (Signed)
Pt called back stating that she only has one test strip back. Please advise, thank you!  Call pt @ [phone]

## 2017-09-01 ENCOUNTER — Other Ambulatory Visit: Payer: Self-pay | Admitting: Obstetrics and Gynecology

## 2017-09-01 DIAGNOSIS — G4730 Sleep apnea, unspecified: Secondary | ICD-10-CM

## 2017-09-01 DIAGNOSIS — I10 Essential (primary) hypertension: Secondary | ICD-10-CM

## 2017-09-08 ENCOUNTER — Other Ambulatory Visit: Payer: Self-pay | Admitting: Internal Medicine

## 2017-09-12 ENCOUNTER — Other Ambulatory Visit: Payer: Self-pay

## 2017-09-17 ENCOUNTER — Other Ambulatory Visit: Payer: Self-pay | Admitting: Internal Medicine

## 2017-09-17 NOTE — Telephone Encounter (Signed)
Last OV was 08/14/17, Last refill was 08/18/17, #90 tablets, no refills, please advise, thanks

## 2017-09-18 NOTE — Telephone Encounter (Signed)
ok'd rx for flexeril #90 with one refill.

## 2017-09-23 ENCOUNTER — Ambulatory Visit: Payer: Disability Insurance | Attending: Obstetrics and Gynecology

## 2017-09-23 ENCOUNTER — Telehealth: Payer: Self-pay

## 2017-09-23 DIAGNOSIS — J449 Chronic obstructive pulmonary disease, unspecified: Secondary | ICD-10-CM | POA: Diagnosis not present

## 2017-09-23 DIAGNOSIS — E119 Type 2 diabetes mellitus without complications: Secondary | ICD-10-CM | POA: Insufficient documentation

## 2017-09-23 DIAGNOSIS — I10 Essential (primary) hypertension: Secondary | ICD-10-CM

## 2017-09-23 DIAGNOSIS — F329 Major depressive disorder, single episode, unspecified: Secondary | ICD-10-CM | POA: Diagnosis not present

## 2017-09-23 DIAGNOSIS — G4730 Sleep apnea, unspecified: Secondary | ICD-10-CM | POA: Diagnosis present

## 2017-09-23 MED ORDER — ALBUTEROL SULFATE (2.5 MG/3ML) 0.083% IN NEBU
2.5000 mg | INHALATION_SOLUTION | Freq: Once | RESPIRATORY_TRACT | Status: AC
  Administered 2017-09-23: 2.5 mg via RESPIRATORY_TRACT
  Filled 2017-09-23: qty 3

## 2017-09-23 NOTE — Telephone Encounter (Signed)
Blood sugar readings in blue folder

## 2017-09-23 NOTE — Telephone Encounter (Deleted)
Blood sugar readings in blue folder

## 2017-09-24 ENCOUNTER — Ambulatory Visit: Payer: Self-pay | Attending: Oncology

## 2017-09-24 ENCOUNTER — Ambulatory Visit
Admission: RE | Admit: 2017-09-24 | Discharge: 2017-09-24 | Disposition: A | Discharge status: Home / Self Care | Payer: Disability Insurance | Source: Ambulatory Visit | Attending: Oncology | Admitting: Oncology

## 2017-09-24 VITALS — BP 138/72 | HR 71 | Temp 97.5°F | Resp 18 | Ht 65.0 in | Wt 255.0 lb

## 2017-09-24 DIAGNOSIS — Z0000 Encounter for general adult medical examination without abnormal findings: Secondary | ICD-10-CM

## 2017-09-24 NOTE — Telephone Encounter (Signed)
My chart message sent to patient with information.

## 2017-09-24 NOTE — Telephone Encounter (Signed)
Reviewed sugars.  Overall appear to be improving when compared to her last a1c.  Continue to monitor and send in readings.

## 2017-09-24 NOTE — Telephone Encounter (Signed)
Left message to return call to our office.  

## 2017-09-24 NOTE — Progress Notes (Signed)
Subjective:     Patient ID: Kathryn Friedman, female   DOB: [birth year], 61 y.o.   MRN: [id number]  HPI   Review of Systems     Objective:   Physical Exam  Pulmonary/Chest: Right breast exhibits no inverted nipple, no mass, no nipple discharge, no skin change and no tenderness. Left breast exhibits no inverted nipple, no mass, no nipple discharge, no skin change and no tenderness. Breasts are symmetrical.       Assessment:     61 year old patient presents for [facility] clinic visit.  Patient screened, and meets BCCCP eligibility.  Patient does not have insurance, Medicare or Medicaid.  Handout given on Affordable Care Act.  Instructed patient on breast self-exam using teach back method.  CBE unremarkable.  No mass or lump palpated.    Plan:     Sent for bilateral screening mammogram.

## 2017-09-25 ENCOUNTER — Other Ambulatory Visit: Payer: Self-pay

## 2017-09-25 DIAGNOSIS — N63 Unspecified lump in unspecified breast: Secondary | ICD-10-CM

## 2017-09-25 NOTE — Telephone Encounter (Signed)
If she is unable to afford the strips, can see if any pt assistance available.  Pharmacy may know if cheaper option.  She has insurance, may be a brand of strips cheaper (may have their preferred).

## 2017-10-02 ENCOUNTER — Ambulatory Visit: Payer: Disability Insurance

## 2017-10-06 ENCOUNTER — Telehealth: Payer: Self-pay | Admitting: Pharmacy Technician

## 2017-10-06 ENCOUNTER — Other Ambulatory Visit: Payer: Self-pay

## 2017-10-06 ENCOUNTER — Ambulatory Visit: Payer: Disability Insurance | Admitting: Pharmacist

## 2017-10-06 ENCOUNTER — Encounter: Payer: Self-pay | Admitting: Pharmacist

## 2017-10-06 ENCOUNTER — Ambulatory Visit: Payer: Self-pay | Admitting: Internal Medicine

## 2017-10-06 ENCOUNTER — Ambulatory Visit
Admission: RE | Admit: 2017-10-06 | Discharge: 2017-10-06 | Disposition: A | Discharge status: Home / Self Care | Payer: Disability Insurance | Source: Ambulatory Visit | Attending: Oncology | Admitting: Oncology

## 2017-10-06 ENCOUNTER — Telehealth: Payer: Self-pay | Admitting: Internal Medicine

## 2017-10-06 VITALS — BP 148/72 | Ht 64.0 in | Wt 255.0 lb

## 2017-10-06 DIAGNOSIS — R920 Mammographic microcalcification found on diagnostic imaging of breast: Secondary | ICD-10-CM

## 2017-10-06 DIAGNOSIS — N63 Unspecified lump in unspecified breast: Secondary | ICD-10-CM

## 2017-10-06 DIAGNOSIS — Z79899 Other long term (current) drug therapy: Secondary | ICD-10-CM

## 2017-10-06 NOTE — Patient Instructions (Addendum)
Eat a meal with the metformin.  Use Advair 2 times daily as prescribed; may be able to decrease Ventolin use  When using Ventolin, wait 1 minute between puffs  Use Calcium Citrate with Vitamin D because you are taking pantoprazole  Call Dr. Nicki Reaper about dizziness and "black outs"  [facility] Outreach for test strips  Return paperwork for [facility] to [place]

## 2017-10-06 NOTE — Progress Notes (Signed)
[facility] Management Clinic Visit Note  Patient: Kathryn Friedman MRN: [id number] Date of Birth: 1956/09/22 PCP: Einar Pheasant, MD   [name] 61 y.o. female presents for an annual medication therapy review visit with the pharmacist today. She continues to have pain in her lower back and both knees.  BP (!) 148/72 (BP Location: Right Arm, Patient Position: Sitting, Cuff Size: Large)   Ht 5\' 4"  (1.626 m)   Wt 255 lb (115.7 kg) Comment: Patient reported  BMI 43.77 kg/m   Patient Information   Past Medical History:  Diagnosis Date  . Anemia   . Arthritis    back and knees  . Asthma   . COPD (chronic obstructive pulmonary disease) ([place])   . Coronary artery disease    s/p stent placement 06/25/06  . Depression    secondary to the death of her husband (died 10)  . Diabetes mellitus without complication ([place]) Diagnosed 05/2008  . Diverticulitis   . GERD (gastroesophageal reflux disease)   . Hypertension   . Hypertriglyceridemia   . Sleep apnea    on CPAP  . Spastic colon       Past Surgical History:  Procedure Laterality Date  . ABDOMINAL HYSTERECTOMY  with left ovary in place 1996  . APPENDECTOMY  1985  . CESAREAN SECTION  1984  . CHOLECYSTECTOMY  1985  . JOINT REPLACEMENT     bilateral knee replacements  . KNEE ARTHROSCOPY  Arthroscopic left knee surgery   . KNEE SURGERY  status post knee surgey   . REPLACEMENT TOTAL KNEE  ([facility])  . SHOULDER SURGERY  shoulder operation secondary to a torn tendon     Family History  Problem Relation Age of Onset  . Other Mother        Hit by a fire truck and has had multiple operations on her back , and has history of MVP   . Mitral valve prolapse Mother   . Heart disease Father        myocardial infarction and is status post bypass surgery  . Mitral valve prolapse Sister   . Hepatitis C Brother   . Cirrhosis Brother   . Colon cancer Paternal Aunt     New Diagnoses (since last visit):   Family  Support: Good  Lifestyle Diet: Eating one - two times per day. Not consistent with her meals as she eats out a lot.     Current Exercise Habits: The patient does not participate in regular exercise at present  Exercise limited by: orthopedic condition(s)    Social History   Substance and Sexual Activity  Alcohol Use No  . Alcohol/week: 0.0 oz      Social History   Tobacco Use  Smoking Status Current Every Day Smoker  . Packs/day: 1.50  . Years: 45.00  . Pack years: 67.50  . Types: Cigarettes  Smokeless Tobacco Never Used      Health Maintenance  Topic Date Due  . Hepatitis C Screening  1956/06/11  . FOOT EXAM  03/08/1966  . HIV Screening  03/09/1971  . TETANUS/TDAP  03/09/1975  . PAP SMEAR  03/08/1977  . HEMOGLOBIN A1C  02/11/2018  . OPHTHALMOLOGY EXAM  03/20/2018  . URINE MICROALBUMIN  08/14/2018  . PNEUMOCOCCAL POLYSACCHARIDE VACCINE (2) 09/23/2018  . MAMMOGRAM  09/24/2018  . COLONOSCOPY  09/13/2026  . INFLUENZA VACCINE  Completed     Outpatient Encounter Medications as of 10/06/2017  Medication Sig  . albuterol (VENTOLIN HFA) 108 (90 BASE) MCG/ACT  inhaler Inhale 2 puffs into the lungs every 4 (four) hours as needed. (Patient taking differently: Inhale 2 puffs into the lungs every 4 (four) hours as needed. 2 puffs twice daily)  . amLODipine (NORVASC) 5 MG tablet TAKE ONE TABLET BY MOUTH EVERY DAY  . aspirin 81 MG tablet Take 81 mg by mouth daily. Take 1 tablet by mouth once a day as directed  . atorvastatin (LIPITOR) 40 MG tablet TAKE ONE TABLET BY MOUTH EVERY EVENING  . Brexpiprazole (REXULTI) 2 MG TABS Take 2 mg by mouth daily.  . citalopram (CELEXA) 40 MG tablet Take 40 mg at bedtime by mouth.  . clopidogrel (PLAVIX) 75 MG tablet TAKE ONE TABLET BY MOUTH EVERY DAY  . colestipol (COLESTID) 1 g tablet Take 2 g by mouth daily.  . cyclobenzaprine (FLEXERIL) 10 MG tablet TAKE ONE TABLET BY MOUTH 3 TIMES A DAY AS NEEDED FOR MUSCLE SPASMS  . DULoxetine  (CYMBALTA) 60 MG capsule Take 60 mg by mouth daily.  . Fluticasone-Salmeterol (ADVAIR) 250-50 MCG/DOSE AEPB Inhale 1 puff into the lungs every 12 (twelve) hours.  . metFORMIN (GLUCOPHAGE-XR) 500 MG 24 hr tablet TAKE TWO TABLETS (1000MG ) BY MOUTH 2 TIMES A DAY  . naproxen (NAPROSYN) 500 MG tablet Take 1 tablet (500 mg total) by mouth 2 (two) times daily with a meal.  . nystatin (NYSTATIN) powder Apply topically 2 (two) times daily.  Marland Kitchen nystatin cream (MYCOSTATIN) Apply 1 application topically 2 (two) times daily.  . pantoprazole (PROTONIX) 40 MG tablet TAKE ONE TABLET BY MOUTH EVERY DAY  . propranolol (INDERAL) 20 MG tablet TAKE TWO TABLETS BY MOUTH 2 TIMES A DAY.  Marland Kitchen propranolol (INDERAL) 40 MG tablet TAKE 1 TABLET BY MOUTH 2 TIMES A DAY.  . traZODone (DESYREL) 100 MG tablet Take 200 mg by mouth at bedtime.   . [DISCONTINUED] acetaminophen (TYLENOL) 500 MG tablet Take 500 mg by mouth every 6 (six) hours as needed for pain.  . [DISCONTINUED] Calcium Carbonate-Vitamin D (CALCIUM 600 + D PO) Take 600 mg by mouth daily.  . [DISCONTINUED] cefdinir (OMNICEF) 300 MG capsule Take 1 capsule (300 mg total) by mouth 2 (two) times daily.  . [DISCONTINUED] citalopram (CELEXA) 20 MG tablet Take 20 mg by mouth at bedtime.  . [DISCONTINUED] glucose blood test strip One Touch Ultra Test Strips. Check blood sugar bid   No facility-administered encounter medications on file as of 10/06/2017.     Labs: A1c = 7.3% (08/14/17) TC = 114 mg/dl; TG = 93 mg/dl; HDL = 52.8 mg/dl; LDL = 41 mg/dl (08/14/17)  Assessment and Plan: 1. Physical was completed on 08/14/17. Mammogram completed 09/24/17. Colonoscopy completed 09/13/16. Lung screening CT completed on 12/31/16, follow-up scheduled for 11/2017. 2. Smoking Cessation: Currently smoking 1.5 packs per day. Discussed smoking cessation, however, patient is not ready to quit. Continues to have lung cancer screening CT's. 3. Diabetes: not currently checking blood sugars. On  metformin ER due to issues with loose stools. Inconsistent diet, eating out each day. Typically eats one meal per day; encouraged patient to eat a meal with her morning metformin dose. 4. Cardiovascular: Hx of CAD, s/p stents. Currently on clopidogrel and aspirin. Patient is also on atorvastatin, amlodipine, and propranolol (migraine prophylaxis). 5. COPD: currently on Advair and Ventolin. Had been using Advair one daily and Ventolin twice daily. States she is having more breathing issues and may increase the Ventolin to 3 times daily. Discovered she was using the Advair only once daily. Encouraged patient to use  the Advair twice daily and wait one minute in between Ventolin doses. 6. Depression: currently on citalopram, brexpiprazole, duloxetine and trazodone. Still not sleeping well, but feels mood is stable. 7. GERD: currently on pantoprazole. Has occasional flares when she eats out. Recommended OTC ranitidine. Discussed potential complications of long-term PPI use. 8. OSA: currently using CPAP and doing well. 9. Describes episode of dizziness and eyes going black. States she has not passed out but feels like she could. Encouraged patient to call Dr. Nicki Reaper today and relay symptoms. Patient states her carotid arteries have been checked and are "good". Cholesterol values within normal limits. Blood pressure slightly elevated. Patient also encouraged to purchase testing strips; gave her a resource to follow up with.  Arbor Leer K. Dicky Doe, PharmD [facility] [place] Operations Coordinator [phone]

## 2017-10-06 NOTE — Telephone Encounter (Signed)
Sent my chart message to patient to call office and go to walk in.

## 2017-10-06 NOTE — Telephone Encounter (Signed)
Called patient no v/m no answer.

## 2017-10-06 NOTE — Telephone Encounter (Signed)
Called patient left message to return call to office.

## 2017-10-06 NOTE — Telephone Encounter (Signed)
Called back Left message to return call to our office.

## 2017-10-06 NOTE — Telephone Encounter (Signed)
Received a cc'd chart notice that this pt was having issues with dizziness and eyes going black.  See pharm D note.  If this is occurring, she needs to be evaluated today.  See note.  I was not sure who to send this message (since late in the day).  I am sending to multiple people.  She needs to be called today.  Thanks

## 2017-10-06 NOTE — Telephone Encounter (Signed)
Patient recently married.  Patient provided marriage license.  Completed [facility] Financial Assistance Application with patient.  Patient is to provide utility bill, 3 months bank statements from patient and spouse and spouse is to sign [id number].  Patient stated that she will collect this information and return to [facility].  Patient has requested that [facility] send financial information to [facility].  [place] [facility]

## 2017-10-07 NOTE — Telephone Encounter (Signed)
She does need evaluation now.  Given that I will not be in the office the full day tomorrow and full schedule this week, she needs to be seen and confirm no other acute issues and then can schedule f/u with me after.

## 2017-10-07 NOTE — Telephone Encounter (Signed)
Called patient l/m to call office. CRM started. Our office needs to speak to patient when she calls. My chart message sent to her as well

## 2017-10-07 NOTE — Telephone Encounter (Signed)
Patient stated she felt  like this up to 15 times one day about a month ago , does not happen everyday, has not happened in the last week. She say it is the same feeling as when you are going to faint does not last long about 15 to 20 seconds. Advised patient she needs to be evaluated refuses to go to UC or ER, stated she does not feel this is serious, tried to explain without testing we do not know if these episodes are serious or what may be causing them. Advised patient could be a TIA or low blood sugars. Ask patient if she checked her CBG's when this happened she says she has no test strips advised we can send strips. Patient refuses evaluation nlees she can see PCP>

## 2017-10-07 NOTE — Telephone Encounter (Signed)
Patient has an appointment scheduled with PCP 10/31/17 patient refuses evaluation now.

## 2017-10-07 NOTE — Telephone Encounter (Signed)
Patient refuses to be seen until she can see PCP.

## 2017-10-14 ENCOUNTER — Ambulatory Visit
Admission: RE | Admit: 2017-10-14 | Discharge: 2017-10-14 | Disposition: A | Discharge status: Home / Self Care | Payer: Self-pay | Source: Ambulatory Visit | Attending: Oncology | Admitting: Oncology

## 2017-10-14 DIAGNOSIS — R920 Mammographic microcalcification found on diagnostic imaging of breast: Secondary | ICD-10-CM

## 2017-10-14 HISTORY — PX: BREAST BIOPSY: SHX20

## 2017-10-15 LAB — SURGICAL PATHOLOGY

## 2017-10-20 ENCOUNTER — Other Ambulatory Visit: Payer: Self-pay

## 2017-10-20 DIAGNOSIS — R920 Mammographic microcalcification found on diagnostic imaging of breast: Secondary | ICD-10-CM

## 2017-10-31 ENCOUNTER — Ambulatory Visit (INDEPENDENT_AMBULATORY_CARE_PROVIDER_SITE_OTHER): Payer: Disability Insurance | Admitting: Internal Medicine

## 2017-10-31 ENCOUNTER — Encounter: Payer: Self-pay | Admitting: Internal Medicine

## 2017-10-31 VITALS — BP 132/78 | HR 92 | Temp 98.7°F | Resp 20 | Ht 64.0 in | Wt 257.4 lb

## 2017-10-31 DIAGNOSIS — J449 Chronic obstructive pulmonary disease, unspecified: Secondary | ICD-10-CM

## 2017-10-31 DIAGNOSIS — I10 Essential (primary) hypertension: Secondary | ICD-10-CM

## 2017-10-31 DIAGNOSIS — K219 Gastro-esophageal reflux disease without esophagitis: Secondary | ICD-10-CM

## 2017-10-31 DIAGNOSIS — D509 Iron deficiency anemia, unspecified: Secondary | ICD-10-CM

## 2017-10-31 DIAGNOSIS — E7800 Pure hypercholesterolemia, unspecified: Secondary | ICD-10-CM

## 2017-10-31 DIAGNOSIS — J849 Interstitial pulmonary disease, unspecified: Secondary | ICD-10-CM

## 2017-10-31 DIAGNOSIS — D649 Anemia, unspecified: Secondary | ICD-10-CM

## 2017-10-31 DIAGNOSIS — I2510 Atherosclerotic heart disease of native coronary artery without angina pectoris: Secondary | ICD-10-CM

## 2017-10-31 DIAGNOSIS — F32A Depression, unspecified: Secondary | ICD-10-CM

## 2017-10-31 DIAGNOSIS — E871 Hypo-osmolality and hyponatremia: Secondary | ICD-10-CM

## 2017-10-31 DIAGNOSIS — Z87891 Personal history of nicotine dependence: Secondary | ICD-10-CM

## 2017-10-31 DIAGNOSIS — F329 Major depressive disorder, single episode, unspecified: Secondary | ICD-10-CM

## 2017-10-31 DIAGNOSIS — E119 Type 2 diabetes mellitus without complications: Secondary | ICD-10-CM

## 2017-10-31 DIAGNOSIS — G4733 Obstructive sleep apnea (adult) (pediatric): Secondary | ICD-10-CM

## 2017-10-31 LAB — CBC WITH DIFFERENTIAL/PLATELET
BASOS ABS: 0.1 10*3/uL (ref 0.0–0.1)
Basophils Relative: 0.9 % (ref 0.0–3.0)
Eosinophils Absolute: 0.2 10*3/uL (ref 0.0–0.7)
Eosinophils Relative: 2 % (ref 0.0–5.0)
HEMATOCRIT: 38.2 % (ref 36.0–46.0)
HEMOGLOBIN: 11.9 g/dL — AB (ref 12.0–15.0)
LYMPHS PCT: 18.7 % (ref 12.0–46.0)
Lymphs Abs: 1.6 10*3/uL (ref 0.7–4.0)
MCHC: 31.1 g/dL (ref 30.0–36.0)
MCV: 75.8 fl — ABNORMAL LOW (ref 78.0–100.0)
MONOS PCT: 4.7 % (ref 3.0–12.0)
Monocytes Absolute: 0.4 10*3/uL (ref 0.1–1.0)
Neutro Abs: 6.3 10*3/uL (ref 1.4–7.7)
Neutrophils Relative %: 73.7 % (ref 43.0–77.0)
Platelets: 209 10*3/uL (ref 150.0–400.0)
RBC: 5.04 Mil/uL (ref 3.87–5.11)
RDW: 19.3 % — ABNORMAL HIGH (ref 11.5–15.5)
WBC: 8.6 10*3/uL (ref 4.0–10.5)

## 2017-10-31 LAB — IBC PANEL
Iron: 21 ug/dL — ABNORMAL LOW (ref 42–145)
Saturation Ratios: 3.7 % — ABNORMAL LOW (ref 20.0–50.0)
Transferrin: 409 mg/dL — ABNORMAL HIGH (ref 212.0–360.0)

## 2017-10-31 LAB — VITAMIN B12: VITAMIN B 12: 335 pg/mL (ref 211–911)

## 2017-10-31 LAB — SODIUM: Sodium: 134 mEq/L — ABNORMAL LOW (ref 135–145)

## 2017-10-31 LAB — FERRITIN: FERRITIN: 11.9 ng/mL (ref 10.0–291.0)

## 2017-10-31 NOTE — Progress Notes (Signed)
Copy to [facility].  Orders in for 6 month follow-up mammogram of left breast per radiology recommendation.  [facility] closed.Will mail appointment information to patient.

## 2017-10-31 NOTE — Progress Notes (Signed)
Patient ID: Kathryn Friedman, female   DOB: Mar 15, 1956, 61 y.o.   MRN: [id number]   Subjective:    Patient ID: Kathryn Friedman, female    DOB: [birth year], 61 y.o.   MRN: [id number]  HPI  Patient here for a scheduled follow up.  She reports she is doing relatively well.  Relationship going well.  Breathing stable.  Has cut back on her smoking.  No chest pain.  No sob.  No acid reflux.  No abdominal pain.  Bowels moving.  Discussed her recent labs.  a1c 7.3.  Discussed adding medications.  She does not want to start a new medication.  States she can adjust her diet.  Has been eating out every meal.  Does not watch what she eats.  Pt assistance in the process of getting her test strips.  Is s/p biopsy breast - negative.  Overall she feels she is dong relatively well.     Past Medical History:  Diagnosis Date  . Anemia   . Arthritis    back and knees  . Asthma   . COPD (chronic obstructive pulmonary disease) ([place])   . Coronary artery disease    s/p stent placement 06/25/06  . Depression    secondary to the death of her husband (died 82)  . Diabetes mellitus without complication ([place]) Diagnosed 05/2008  . Diverticulitis   . GERD (gastroesophageal reflux disease)   . Hypertension   . Hypertriglyceridemia   . Sleep apnea    on CPAP  . Spastic colon    Past Surgical History:  Procedure Laterality Date  . ABDOMINAL HYSTERECTOMY  with left ovary in place 1996  . APPENDECTOMY  1985  . BREAST BIOPSY Left 10/14/2017   affirm bx for calc, ribbon clip  path pending  . CESAREAN SECTION  1984  . CHOLECYSTECTOMY  1985  . COLONOSCOPY WITH PROPOFOL N/A 09/13/2016   Procedure: COLONOSCOPY WITH PROPOFOL;  Surgeon: Manya Silvas, MD;  Location: [facility] ENDOSCOPY;  Service: Endoscopy;  Laterality: N/A;  . JOINT REPLACEMENT     bilateral knee replacements  . KNEE ARTHROSCOPY  Arthroscopic left knee surgery   . KNEE SURGERY  status post knee surgey   . REPLACEMENT TOTAL KNEE  ([facility])  .  SHOULDER SURGERY  shoulder operation secondary to a torn tendon   Family History  Problem Relation Age of Onset  . Other Mother        Hit by a fire truck and has had multiple operations on her back , and has history of MVP   . Mitral valve prolapse Mother   . Heart disease Father        myocardial infarction and is status post bypass surgery  . Mitral valve prolapse Sister   . Hepatitis C Brother   . Cirrhosis Brother   . Colon cancer Paternal Aunt    Social History   Socioeconomic History  . Marital status: Married    Spouse name: None  . Number of children: 1  . Years of education: None  . Highest education level: None  Social Needs  . Financial resource strain: None  . Food insecurity - worry: None  . Food insecurity - inability: None  . Transportation needs - medical: None  . Transportation needs - non-medical: None  Occupational History    Employer: [employer]  Tobacco Use  . Smoking status: Current Every Day Smoker    Packs/day: 1.50    Years: 45.00    Pack years: 67.50  Types: Cigarettes  . Smokeless tobacco: Never Used  Substance and Sexual Activity  . Alcohol use: No    Alcohol/week: 0.0 oz  . Drug use: No  . Sexual activity: None  Other Topics Concern  . None  Social History Narrative  . None    Outpatient Encounter Medications as of 10/31/2017  Medication Sig  . albuterol (VENTOLIN HFA) 108 (90 BASE) MCG/ACT inhaler Inhale 2 puffs into the lungs every 4 (four) hours as needed. (Patient taking differently: Inhale 2 puffs into the lungs every 4 (four) hours as needed. 2 puffs twice daily)  . amLODipine (NORVASC) 5 MG tablet TAKE ONE TABLET BY MOUTH EVERY DAY  . aspirin 81 MG tablet Take 81 mg by mouth daily. Take 1 tablet by mouth once a day as directed  . atorvastatin (LIPITOR) 40 MG tablet TAKE ONE TABLET BY MOUTH EVERY EVENING  . Brexpiprazole (REXULTI) 2 MG TABS Take 2 mg by mouth daily.  . citalopram (CELEXA) 40 MG tablet Take 40 mg at bedtime by mouth.   . clopidogrel (PLAVIX) 75 MG tablet TAKE ONE TABLET BY MOUTH EVERY DAY  . colestipol (COLESTID) 1 g tablet Take 2 g by mouth daily.  . cyclobenzaprine (FLEXERIL) 10 MG tablet TAKE ONE TABLET BY MOUTH 3 TIMES A DAY AS NEEDED FOR MUSCLE SPASMS  . DULoxetine (CYMBALTA) 60 MG capsule Take 60 mg by mouth daily.  . Fluticasone-Salmeterol (ADVAIR) 250-50 MCG/DOSE AEPB Inhale 1 puff into the lungs every 12 (twelve) hours.  . metFORMIN (GLUCOPHAGE-XR) 500 MG 24 hr tablet TAKE TWO TABLETS (1000MG) BY MOUTH 2 TIMES A DAY  . naproxen (NAPROSYN) 500 MG tablet Take 1 tablet (500 mg total) by mouth 2 (two) times daily with a meal.  . nystatin (NYSTATIN) powder Apply topically 2 (two) times daily.  Marland Kitchen nystatin cream (MYCOSTATIN) Apply 1 application topically 2 (two) times daily.  . pantoprazole (PROTONIX) 40 MG tablet TAKE ONE TABLET BY MOUTH EVERY DAY  . propranolol (INDERAL) 20 MG tablet TAKE TWO TABLETS BY MOUTH 2 TIMES A DAY.  Marland Kitchen propranolol (INDERAL) 40 MG tablet TAKE 1 TABLET BY MOUTH 2 TIMES A DAY.  . traZODone (DESYREL) 100 MG tablet Take 200 mg by mouth at bedtime.    No facility-administered encounter medications on file as of 10/31/2017.     Review of Systems  Constitutional: Negative for appetite change and unexpected weight change.  HENT: Negative for congestion and sinus pressure.   Respiratory: Negative for cough, chest tightness and shortness of breath.   Cardiovascular: Negative for chest pain, palpitations and leg swelling.  Gastrointestinal: Negative for abdominal pain, diarrhea, nausea and vomiting.  Genitourinary: Negative for difficulty urinating and dysuria.  Musculoskeletal: Negative for joint swelling and myalgias.  Skin: Negative for color change and rash.  Neurological: Negative for dizziness, light-headedness and headaches.  Psychiatric/Behavioral: Negative for agitation and dysphoric mood.       Objective:     Blood pressure rechecked by me:  132/78  Physical Exam    Constitutional: She appears well-developed and well-nourished. No distress.  HENT:  Nose: Nose normal.  Mouth/Throat: Oropharynx is clear and moist.  Neck: Neck supple. No thyromegaly present.  Cardiovascular: Normal rate and regular rhythm.  Pulmonary/Chest: Breath sounds normal. No respiratory distress. She has no wheezes.  Abdominal: Soft. Bowel sounds are normal. There is no tenderness.  Musculoskeletal: She exhibits no edema or tenderness.  Lymphadenopathy:    She has no cervical adenopathy.  Skin: No rash noted. No erythema.  Psychiatric: She has a normal mood and affect. Her behavior is normal.    BP 132/78   Pulse 92   Temp 98.7 F (37.1 C)   Resp 20   Ht 5' 4" (1.626 m)   Wt 257 lb 6 oz (116.7 kg)   SpO2 94%   BMI 44.18 kg/m  Wt Readings from Last 3 Encounters:  10/31/17 257 lb 6 oz (116.7 kg)  10/06/17 255 lb (115.7 kg)  09/24/17 255 lb (115.7 kg)     Lab Results  Component Value Date   WBC 8.6 10/31/2017   HGB 11.9 (L) 10/31/2017   HCT 38.2 10/31/2017   PLT 209.0 10/31/2017   GLUCOSE 132 (H) 08/14/2017   CHOL 114 08/14/2017   TRIG 93.0 08/14/2017   HDL 52.80 08/14/2017   LDLCALC 43 08/14/2017   ALT 21 08/14/2017   AST 23 08/14/2017   NA 134 (L) 10/31/2017   K 4.4 08/14/2017   CL 101 08/14/2017   CREATININE 0.79 08/14/2017   BUN 11 08/14/2017   CO2 29 08/14/2017   TSH 0.95 02/07/2017   HGBA1C 7.3 (H) 08/14/2017   MICROALBUR <0.7 08/14/2017    Ms Digital Diag Uni Left  Result Date: 10/14/2017 CLINICAL DATA:  Post stereotactic core needle biopsy of left breast calcifications. EXAM: DIAGNOSTIC LEFT MAMMOGRAM POST STEREOTACTIC BIOPSY COMPARISON:  Previous exam(s). FINDINGS: Mammographic images were obtained following stereotactic guided biopsy of left breast upper outer quadrant calcifications. Two-view mammography demonstrates presence of ribbon shaped marker 8 mm inferior and 6 mm medial to the biopsy site. There remains an adjacent group of  similar morphology calcifications. Expected post biopsy changes are seen. IMPRESSION: Successful placement of ribbon shaped marker, post stereotactic core needle biopsy of the left breast. Final Assessment: Post Procedure Mammograms for Marker Placement Electronically Signed   By: Fidela Salisbury M.D.   On: 10/14/2017 09:16   Mm Lt Breast Bx W Loc Dev 1st Lesion Image Bx Spec Stereo Guide  Addendum Date: 10/20/2017   ADDENDUM REPORT: 10/20/2017 15:18 ADDENDUM: The patient contacted Jetta Lout, [place] on 10/20/17 at 3:00 PM for biopsy results. Results and recommendations were relayed to the patient. She stated she has done well following the biopsy. She was encouraged to contact the [facility] with any further questions or concerns. Addendum by Jetta Lout, RRA on 10/20/17. Electronically Signed   By: Fidela Salisbury M.D.   On: 10/20/2017 15:18   Addendum Date: 10/20/2017   ADDENDUM REPORT: 10/20/2017 12:12 ADDENDUM: Pathology of the left breast biopsy revealed A. LEFT BREAST, UPPER OUTER QUADRANT; STEREOTACTIC BIOPSY: MICROCALCIFICATIONS, FIBROSIS, GIANT CELL REACTION AND CHRONIC INFLAMMATION. NEGATIVE FOR ATYPIA AND MALIGNANCY. This was found to be concordant by Dr. Jetta Lout. Recommendation: Six month follow-up diagnostic mammogram of additional calcifications left breast. Attempts were made to reach the patient to discuss results and recommendations by Jetta Lout, RRA on 10/20/17 but were unsuccessful. The only contact phone number for the patient is blocked to receive incoming calls. This information was relayed to Al Pimple, RN and Tanya Nones, RN, nurse navigators and [employer] coordinators for [facility]. The referral will be made by the nurse navigators and they will contact the patient with results. Per Tanya Nones, RN, a letter will be mailed to the patient if necessary. Addendum by Jetta Lout, RRA on 10/20/17. Electronically Signed   By: Fidela Salisbury M.D.   On: 10/20/2017 12:12   Result Date: 10/20/2017 CLINICAL DATA:  Left breast upper outer indeterminate quadrant  calcifications. EXAM: LEFT BREAST STEREOTACTIC CORE NEEDLE BIOPSY COMPARISON:  Previous exams. FINDINGS: The patient and I discussed the procedure of stereotactic-guided biopsy including benefits and alternatives. We discussed the high likelihood of a successful procedure. We discussed the risks of the procedure including infection, bleeding, tissue injury, clip migration, and inadequate sampling. Informed written consent was given. The usual time out protocol was performed immediately prior to the procedure. Using sterile technique and 1% Lidocaine as local anesthetic, under stereotactic guidance, a 9 gauge vacuum assisted device was used to perform core needle biopsy of calcifications in the left breast upper outer quadrant, posterior depth, using a superior approach. Specimen radiograph was performed showing presence of calcifications. Specimens with calcifications are identified for pathology. Lesion quadrant: Upper outer quadrant. At the conclusion of the procedure, a ribbon shaped tissue marker clip was deployed into the biopsy cavity. Follow-up 2-view mammogram was performed and dictated separately. IMPRESSION: Stereotactic-guided biopsy of left breast. No apparent complications. Electronically Signed: By: Fidela Salisbury M.D. On: 10/14/2017 09:19       Assessment & Plan:   Problem List Items Addressed This Visit    Anemia   Relevant Orders   CBC with Differential/Platelet   Ferritin   CAD (coronary artery disease)    S/p stent placement.  Has seen cardiology.  Continue risk factor modification.       COPD (chronic obstructive pulmonary disease) (HCC)    Breathing stable.  Continue inhalers.       Depression    Seeing psychiatry.  Doing better.  Follow.        Diabetes ([place])    Low carb diet and exercise.  Follow met b and a1c.  Discussed increased a1c -  7.3.  She has not been watching her diet.  Plans to start checking sugars and watching diet.  Desires not to add medication at this time.  Follow.        Relevant Orders   Hemoglobin A1c   Essential hypertension, benign    Blood pressure on recheck improved.  Same medication regimen.  Follow pressures.  Follow metabolic panel.        Relevant Orders   TSH   Basic metabolic panel   GERD (gastroesophageal reflux disease)    Controlled on protonix.  Follow.        Hypercholesterolemia    On lipitor.  Low cholesterol diet and exercise.  Follow lipid panel and liver function tests.        Relevant Orders   Hepatic function panel   Lipid panel   ILD (interstitial lung disease) (HCC)    Breathing stable.  Follow.       Iron deficiency anemia    hgb stable 11.9.  Follow.        Obstructive sleep apnea    CPAP.       Personal history of tobacco use, presenting hazards to health    Discussed the need to quit smoking.  Has cut back. Follow.         Other Visit Diagnoses    Hyponatremia    -  Primary   Relevant Orders   Sodium (Completed)       Einar Pheasant, MD

## 2017-11-02 ENCOUNTER — Encounter: Payer: Self-pay | Admitting: Internal Medicine

## 2017-11-02 ENCOUNTER — Other Ambulatory Visit: Payer: Self-pay | Admitting: Internal Medicine

## 2017-11-02 DIAGNOSIS — D649 Anemia, unspecified: Secondary | ICD-10-CM

## 2017-11-02 NOTE — Assessment & Plan Note (Signed)
Breathing stable.  Continue inhalers.   

## 2017-11-02 NOTE — Assessment & Plan Note (Signed)
Blood pressure on recheck improved.  Same medication regimen.  Follow pressures.  Follow metabolic panel.   

## 2017-11-02 NOTE — Progress Notes (Signed)
Order placed for f/u cbc and ferritin.   

## 2017-11-02 NOTE — Assessment & Plan Note (Signed)
Discussed the need to quit smoking.  Has cut back. Follow.

## 2017-11-02 NOTE — Assessment & Plan Note (Signed)
Breathing stable.  Follow.    

## 2017-11-02 NOTE — Assessment & Plan Note (Signed)
On lipitor.  Low cholesterol diet and exercise.  Follow lipid panel and liver function tests.   

## 2017-11-02 NOTE — Assessment & Plan Note (Signed)
Low carb diet and exercise.  Follow met b and a1c.  Discussed increased a1c - 7.3.  She has not been watching her diet.  Plans to start checking sugars and watching diet.  Desires not to add medication at this time.  Follow.

## 2017-11-02 NOTE — Assessment & Plan Note (Signed)
CPAP.  

## 2017-11-02 NOTE — Assessment & Plan Note (Signed)
S/p stent placement.  Has seen cardiology.  Continue risk factor modification.

## 2017-11-02 NOTE — Assessment & Plan Note (Signed)
Controlled on protonix.  Follow.   

## 2017-11-02 NOTE — Assessment & Plan Note (Signed)
hgb stable 11.9.  Follow.

## 2017-11-02 NOTE — Assessment & Plan Note (Signed)
Seeing psychiatry.  Doing better.  Follow.

## 2017-11-04 ENCOUNTER — Other Ambulatory Visit: Payer: Self-pay | Admitting: Internal Medicine

## 2017-11-05 NOTE — Progress Notes (Signed)
Mailed 6 month follow-up appointment information.  Appointment scheduled for Tuesday Apr 14, 2018 at 1:00 p.m.

## 2017-11-10 ENCOUNTER — Other Ambulatory Visit: Payer: Self-pay | Admitting: Internal Medicine

## 2017-11-13 ENCOUNTER — Other Ambulatory Visit: Payer: Self-pay | Admitting: Internal Medicine

## 2017-11-21 ENCOUNTER — Other Ambulatory Visit: Payer: Self-pay | Admitting: Internal Medicine

## 2017-11-24 NOTE — Telephone Encounter (Signed)
Last filled in September. Okay to refill Nystatin Cream?

## 2017-12-01 ENCOUNTER — Other Ambulatory Visit: Payer: Self-pay | Admitting: Internal Medicine

## 2017-12-02 ENCOUNTER — Other Ambulatory Visit (INDEPENDENT_AMBULATORY_CARE_PROVIDER_SITE_OTHER): Payer: Self-pay

## 2017-12-02 DIAGNOSIS — D649 Anemia, unspecified: Secondary | ICD-10-CM

## 2017-12-02 DIAGNOSIS — E7800 Pure hypercholesterolemia, unspecified: Secondary | ICD-10-CM

## 2017-12-02 DIAGNOSIS — E119 Type 2 diabetes mellitus without complications: Secondary | ICD-10-CM

## 2017-12-02 DIAGNOSIS — I10 Essential (primary) hypertension: Secondary | ICD-10-CM

## 2017-12-02 LAB — CBC WITH DIFFERENTIAL/PLATELET
Basophils Absolute: 0.1 10*3/uL (ref 0.0–0.1)
Basophils Relative: 0.8 % (ref 0.0–3.0)
EOS ABS: 0.2 10*3/uL (ref 0.0–0.7)
EOS PCT: 2.4 % (ref 0.0–5.0)
HEMATOCRIT: 36.5 % (ref 36.0–46.0)
Hemoglobin: 11.4 g/dL — ABNORMAL LOW (ref 12.0–15.0)
LYMPHS PCT: 22.8 % (ref 12.0–46.0)
Lymphs Abs: 1.5 10*3/uL (ref 0.7–4.0)
MCHC: 31.3 g/dL (ref 30.0–36.0)
MCV: 74.3 fl — ABNORMAL LOW (ref 78.0–100.0)
MONO ABS: 0.3 10*3/uL (ref 0.1–1.0)
Monocytes Relative: 5 % (ref 3.0–12.0)
NEUTROS PCT: 69 % (ref 43.0–77.0)
Neutro Abs: 4.7 10*3/uL (ref 1.4–7.7)
PLATELETS: 194 10*3/uL (ref 150.0–400.0)
RBC: 4.9 Mil/uL (ref 3.87–5.11)
RDW: 20.9 % — AB (ref 11.5–15.5)
WBC: 6.8 10*3/uL (ref 4.0–10.5)

## 2017-12-02 LAB — BASIC METABOLIC PANEL
BUN: 5 mg/dL — AB (ref 6–23)
CALCIUM: 8.5 mg/dL (ref 8.4–10.5)
CO2: 26 mEq/L (ref 19–32)
CREATININE: 0.65 mg/dL (ref 0.40–1.20)
Chloride: 102 mEq/L (ref 96–112)
GFR: 98.25 mL/min (ref >60.00)
Glucose, Bld: 189 mg/dL — ABNORMAL HIGH (ref 70–99)
Potassium: 4.3 mEq/L (ref 3.5–5.1)
Sodium: 134 mEq/L — ABNORMAL LOW (ref 135–145)

## 2017-12-02 LAB — HEMOGLOBIN A1C: HEMOGLOBIN A1C: 7.6 % — AB (ref 4.6–6.5)

## 2017-12-02 LAB — LIPID PANEL
CHOLESTEROL: 93 mg/dL (ref 0–200)
HDL: 35.7 mg/dL — ABNORMAL LOW (ref >39.00)
LDL Cholesterol: 40 mg/dL (ref 0–99)
NONHDL: 57.6
Total CHOL/HDL Ratio: 3
Triglycerides: 90 mg/dL (ref 0.0–149.0)
VLDL: 18 mg/dL (ref 0.0–40.0)

## 2017-12-02 LAB — HEPATIC FUNCTION PANEL
ALK PHOS: 64 U/L (ref 39–117)
ALT: 11 U/L (ref 0–35)
AST: 17 U/L (ref 0–37)
Albumin: 3.8 g/dL (ref 3.5–5.2)
BILIRUBIN DIRECT: 0.1 mg/dL (ref 0.0–0.3)
BILIRUBIN TOTAL: 0.4 mg/dL (ref 0.2–1.2)
Total Protein: 6.4 g/dL (ref 6.0–8.3)

## 2017-12-02 LAB — FERRITIN: Ferritin: 13.8 ng/mL (ref 10.0–291.0)

## 2017-12-02 LAB — TSH: TSH: 1.42 u[IU]/mL (ref 0.35–4.50)

## 2017-12-08 ENCOUNTER — Other Ambulatory Visit: Payer: Self-pay | Admitting: Internal Medicine

## 2017-12-15 ENCOUNTER — Ambulatory Visit (INDEPENDENT_AMBULATORY_CARE_PROVIDER_SITE_OTHER): Payer: Self-pay | Admitting: Pharmacist

## 2017-12-15 ENCOUNTER — Encounter: Payer: Self-pay | Admitting: Pharmacist

## 2017-12-15 VITALS — BP 164/96 | Ht 64.0 in | Wt 259.8 lb

## 2017-12-15 DIAGNOSIS — E119 Type 2 diabetes mellitus without complications: Secondary | ICD-10-CM

## 2017-12-15 NOTE — Assessment & Plan Note (Signed)
Diabetes longstanding currently sub-optimally controlled evidenced by worsened A1C. Patient denies hypoglycemic events and is able to verbalize appropriate hypoglycemia management plan. Patient reports adherence with medication. Control is suboptimal due to eating 1 meal/ day and dietary indiscretion, sedentary lifestyle.  - Initiate Jardiance 10mg  daily. Patient educated on purpose, proper use and potential adverse effects of Jardiance.  Following instruction patient verbalized understanding of treatment plan. Plan to increase at next visit if tolerated. Will fill out patient assistance program with patient if tolerated.  - Counseled on sick day rules for Jardiance - Continue metformin XR 1000 mg BID - Asked patient to start CBG checks at least once daily - Long discussion had about dietary modification (slow and steady) with decrease in candy, increase in vegetables, decrease in sugar with coffee suggested - Next A1C anticipated 3/08/ 2019.   - Educated on hyperglycemia signs and symptoms and basic pathophysiology of DM.  Medication Samples have been provided to the patient.  Drug name: Jardiance       Strength: 10 mg        Qty: 14  LOT: [id number]  Exp.Date: 09/25/2019  Dosing instructions: 1 tab PO daily

## 2017-12-15 NOTE — Patient Instructions (Addendum)
Thanks for seeing Kathryn Friedman today! Your diabetes isn't terribly out of control but it's time to seriously look at diet/exercise and medications. We will change your medications today. Your goal is to slowly make changes to your diet to improve your blood sugar control.  1. Start Jardiance 10mg  by mouth  once a day. DO NOT take this medication if you are sick, throwing up or dehydrated.  2. Diet goals: eat salads 3-4 days each week. Eat only ONE tootsie pop on Mondays, Wednesdays, and Fridays. Eat only TWO pops on other days. Substitute sweet and low for your sugar in your coffee every day. 3. You can buy blood sugar meter and test strips at [facility]. The brand is Relion and these should be pretty inexpensive.  4. Test your blood sugar 2-3 times each week first thing in the morning. Write down these numbers and bring them in with you next time I see you.   Come back to see me in 2 weeks. We will likely increase your Jardiance to 25 mg then. Please bring in your financial documents in the next time you see me.

## 2017-12-15 NOTE — Progress Notes (Signed)
S:     No chief complaint on file.   Patient arrives in good spirits and ambulatory.  Presents for diabetes evaluation, education, and management at the request of Dr. Nicki Reaper (referred on 11/02/2018). Last seen by primary care provider on 12/02/2017 - at that time A1C was found to be elevated and she was referred to pharmacy to discuss next steps and cost effectiveness.   Today, patient reports poor control of DM 2/2 her diet however reports she doesn't want to change anything. Unwilling to give up soda, stop eating out (husband prefers)/eating one large meal/day, and resistant to changes to candy intake. Patient denies CBG checks at this time.   Reports she continues to smoke 1 pack per day. Has been an every-day smoker since she was 62 year old with at least a 19 pack-year hx. Reports she has quit for a week before. Smoked more during more stressful periods in her life, endorses decreased stress at present. She states she has used Chantix which made her "depressed" and quit hot line which was helpful. Has also used patches but "smoked on those like it was nothing". Reports consideration of quitting but not ready yet.   Family/Social History: husband has DM and was just started on insulin;  Moved in with husband in March and started eating out.   Insurance coverage/medication affordability: [facility]  Patient reports adherence with medications.  Current diabetes medications include: metformin XR 1000 mg BID Current hypertension medications include: amlodipine 5 mg daily, propranolol 40 mg TID (unclear on mg strength)  Patient denies hypoglycemic events.  Patient reported dietary habits: Eats 1 LARGE meal/day around [employer] yesterday: 2 egg rolls and LoMein and rice and angel hair pasta and cantaloupe.  Today: [place] waffle bacon and coffee.  States she has been eating salads more often. Snacks: Tootsie Roll Pops 2-3 suckers/day.  Strawberry soda x2 12oz/ day.  Coffee with two spoons of sugar and hazelnut of sugar.    Patient reported exercise habits: back and knee pain- history of both knee replaced.    Patient reports nocturia  1-2/ night  Patient denies pain/burning on urination.  Patient denies neuropathy. Patient reports visual changes 2/2 cataracts Patient reports self foot exams.     O:  Physical Exam  Constitutional: She appears well-developed and well-nourished.     Review of Systems  All other systems reviewed and are negative.    Lab Results  Component Value Date   HGBA1C 7.6 (H) 12/02/2017   There were no vitals filed for this visit.  Lipid Panel     Component Value Date/Time   CHOL 93 12/02/2017 0932   TRIG 90.0 12/02/2017 0932   HDL 35.70 (L) 12/02/2017 0932   CHOLHDL 3 12/02/2017 0932   VLDL 18.0 12/02/2017 0932   LDLCALC 40 12/02/2017 0932   GFR 12/02/2017 - 98   Clinical ASCVD: Yes ; High-risk conditions: Age 38 or older, h/o PCI x 2 stents, DM, HTN,  current smoking   A/P: Diabetes longstanding currently sub-optimally controlled evidenced by worsened A1C. Patient denies hypoglycemic events and is able to verbalize appropriate hypoglycemia management plan. Patient reports adherence with medication. Control is suboptimal due to eating 1 meal/ day and dietary indiscretion, sedentary lifestyle.  - Initiate Jardiance 10mg  daily. Patient educated on purpose, proper use and potential adverse effects of Jardiance.  Following instruction patient verbalized understanding of treatment plan. Plan to increase at next visit if tolerated. Will fill out patient assistance program  with patient if tolerated.  - Counseled on sick day rules for Jardiance - Continue metformin XR 1000 mg BID - Asked patient to start CBG checks at least once daily - Long discussion had about dietary modification (slow and steady) with decrease in candy, increase in vegetables, decrease in sugar with coffee suggested - Next A1C anticipated 3/08/  2019.   - Educated on hyperglycemia signs and symptoms and basic pathophysiology of DM.  Medication Samples have been provided to the patient.  Drug name: Jardiance       Strength: 10 mg        Qty: 14  LOT: [id number]  Exp.Date: 09/25/2019  Dosing instructions: 1 tab PO daily   #ASCVD risk -secondary prevention in patient aged 39-75 with DM, baseline LDL 70-189. LDL well controlled less than 70 mg/dL at 40 mg/dL on last check 12/02/2017.  - Continued Aspirin 81 mg  - Continued atorvastatin 40 mg.   #Hypertension longstanding currently uncontrolled.  Patient reports adherence with medication. Control is suboptimal due to dietary sodium intake, sedentary lifestyle, obesity. - Continue current meds - Reassess at next visit.  - Jardiance will likely contribute to blood pressure control if tolerated.   Written patient instructions provided.  Total time in face to face counseling 45 minutes.    Follow up in Pharmacist Clinic Visit 2 weeks.   Patient seen with Felicity Pellegrini, PharmD Candidate  Carlean Jews, Pharm.D., BCPS, CPP PGY2 Ambulatory Care Pharmacy Resident Phone: [phone]

## 2017-12-15 NOTE — Assessment & Plan Note (Signed)
 #  ASCVD risk -secondary prevention in patient aged 62-75 with DM, baseline LDL 70-189. LDL well controlled less than 70 mg/dL at 40 mg/dL on last check 12/02/2017.  - Continued Aspirin 81 mg  - Continued atorvastatin 40 mg.   #Hypertension longstanding currently uncontrolled.  Patient reports adherence with medication. Control is suboptimal due to dietary sodium intake, sedentary lifestyle, obesity. - Continue current meds - Reassess at next visit.  - Jardiance will likely contribute to blood pressure control if tolerated.

## 2017-12-22 ENCOUNTER — Telehealth: Payer: Self-pay | Admitting: Pharmacist

## 2017-12-22 NOTE — Telephone Encounter (Signed)
Patient called stating that she experienced a yeast infection on Jardiance and self d/c'd after 3 days of therapy. Has treated it and symptoms have resolved.   Reports that she has stopped eating lollipops and has subbed out sweet and low for sugar in her coffee.   States she has NOT gotten a blood glucose meter yet but is still willing to do this.   Advised patient to STOP jardiance. Added to intolerance list. Can pursue different medication like a GLP1 or DPPIV-I at next visit, scheduled for 2/4. Patient verbalized understanding and commits to obtaining a CBG mete/testing blood sugar before next visit.   Carlean Jews, Pharm.D., BCPS PGY2 Ambulatory Care Pharmacy Resident Phone: [phone]

## 2017-12-29 ENCOUNTER — Telehealth: Payer: Self-pay | Admitting: *Deleted

## 2017-12-29 ENCOUNTER — Other Ambulatory Visit: Payer: Self-pay | Admitting: Internal Medicine

## 2017-12-29 ENCOUNTER — Ambulatory Visit: Payer: Self-pay | Admitting: Pharmacist

## 2017-12-29 DIAGNOSIS — Z122 Encounter for screening for malignant neoplasm of respiratory organs: Secondary | ICD-10-CM

## 2017-12-29 DIAGNOSIS — Z87891 Personal history of nicotine dependence: Secondary | ICD-10-CM

## 2017-12-29 NOTE — Telephone Encounter (Signed)
Notified patient that annual lung cancer screening low dose CT scan is due currently or will be in near future. Confirmed that patient is within the age range of 55-77, and asymptomatic, (no signs or symptoms of lung cancer). Patient denies illness that would prevent curative treatment for lung cancer if found. Verified smoking history, (current, 69 pack year). The shared decision making visit was done 08/13/16. Patient is agreeable for CT scan being scheduled.

## 2017-12-29 NOTE — Telephone Encounter (Signed)
Left message for patient to notify them that it is time to schedule annual low dose lung cancer screening CT scan. Instructed patient to call back to verify information prior to the scan being scheduled.  

## 2017-12-30 NOTE — Telephone Encounter (Signed)
Last OV 10/31/2017 Next OV none Last refill 11/05/2017

## 2018-01-05 ENCOUNTER — Ambulatory Visit (INDEPENDENT_AMBULATORY_CARE_PROVIDER_SITE_OTHER): Payer: Self-pay | Admitting: Pharmacist

## 2018-01-05 DIAGNOSIS — I10 Essential (primary) hypertension: Secondary | ICD-10-CM

## 2018-01-05 MED ORDER — LIRAGLUTIDE 18 MG/3ML ~~LOC~~ SOPN: PEN_INJECTOR | SUBCUTANEOUS | 0 refills | Status: DC

## 2018-01-05 MED ORDER — AMLODIPINE BESYLATE 10 MG PO TABS: 10.0000 mg | ORAL_TABLET | Freq: Every day | ORAL | 1 refills | Status: DC

## 2018-01-05 NOTE — Progress Notes (Signed)
S:     Chief Complaint  Patient presents with  . Medication Management    Diabetes   Patient arrives in good spirits and ambulating without assistance.  Presents for diabetes evaluation, education, and management at the request of Dr. Nicki Reaper (referred on 11/02/2018). Last seen by primary care provider on 12/02/2017 - at that time A1C was found to be elevated and she was referred to pharmacy to discuss next steps and cost effectiveness.  Last Rx Clinic visit on 12/15/2017 - at that time Jardiance was started however patient developed yeast infection and self-discontinued Jardiance.   Today, patient denies s/sx of yeast infection or UTI. She states that she has obtained a CBG meter and has been checking fastings. Reports she has made dietary changes of : down to 1-2 lollipops/day, uses sweet n low in her coffee instead of sugar, drinking orange juice daily. States she was just approved for social security disability, thinks she will also get [place] medicaid as a result of this but is not sure. Brings in financial information and fills out application for [employer]. Took meds this morning. Denies personal or family h/o MEN2 or medullary thyroid carcinoma.   Family/Social History: husband has DM and was just started on insulin;  Moved in with husband in March and started eating out.   Insurance coverage/medication affordability: [facility]  Patient reports adherence with medications.  Current diabetes medications include: metformin XR 1000 mg BID Current hypertension medications include: amlodipine 5 mg daily, propranolol 40 mg TID   Patient denies hypoglycemic events.  Patient reported dietary habits: Eats 1 meals/day  Patient reported exercise habits: none at present, limited by back and knee pain - history of bilateral knee replacements    Patient reports nocturia  1-2/ night  Patient denies pain/burning on urination.  Patient denies neuropathy. Patient reports visual  changes 2/2 cataracts Patient reports self foot exams.    O:  Physical Exam  Constitutional: She appears well-developed and well-nourished.     Review of Systems  All other systems reviewed and are negative.    Lab Results  Component Value Date   HGBA1C 7.6 (H) 12/02/2017   Vitals:   01/05/18 1403  BP: (!) 152/74  Pulse: 66    Lipid Panel     Component Value Date/Time   CHOL 93 12/02/2017 0932   TRIG 90.0 12/02/2017 0932   HDL 35.70 (L) 12/02/2017 0932   CHOLHDL 3 12/02/2017 0932   VLDL 18.0 12/02/2017 0932   LDLCALC 40 12/02/2017 0932    Home fasting CBG: 110s-150s  GFR 12/02/2017 - 98 K 12/02/2017 - 4.3 Scr 12/02/2017 - 0.65  CrCl calculated using adjusted body weight = ~115 ml/min Urine microalbumin 08/14/17 <0.7 Microalbumin:Cr, urine 08/14/17 3.0    Clinical ASCVD: Yes ; High-risk conditions: Age 39 or older, h/o PCI x 2 stents, DM, HTN,  current smoking   A/P: #Diabetes longstanding currently sub-optimally controlled evidenced by A1C; however, fastings near-controlled. Patient denies hypoglycemic events and is able to verbalize appropriate hypoglycemia management plan. Patient reports adherence with medication. Control is suboptimal due to meal pattern and dietary indiscretion, sedentary lifestyle.  - Continue metformin XR 1000 mg BID - Start Victoza 0.6 mg daily x 1 week, 1.2 mg daily x 1 week, then 1.8 mg thereafter. Chose this for weight loss, ASCVD benefit and glycemic control. If patient is approved for Medicaid, this is now a formulary GLP1-RA. Filled out patient assistance application via [employer] for Victoza in case  she is not approved for Medicaid. Sample provided. Patient educated on purpose, proper use and potential adverse effects of Victoza.  Following instruction patient verbalized understanding of treatment plan.  -Continued to counsel on dietary CHO and sugar impacts on glycemic control - Next A1C anticipated 3/08/ 2019.     #ASCVD risk  -secondary prevention in patient aged 76-75 with DM, baseline LDL 70-189. LDL well controlled less than 70 mg/dL at 40 mg/dL on last check 12/02/2017.  - Continued Aspirin 81 mg  - Continued atorvastatin 40 mg  #Hypertension longstanding currently uncontrolled.  Patient reports adherence with medication. Control is suboptimal due to dietary sodium intake, sedentary lifestyle, obesity. - Increase amlodipine to 10 mg daily - Continue all other medications - Reassess at next visit  Written patient instructions provided.  Total time in face to face counseling 30 minutes.    Follow up in Pharmacist Clinic Visit 1 month.     Carlean Jews, Pharm.D., BCPS, CPP PGY2 Ambulatory Care Pharmacy Resident Phone: [phone]

## 2018-01-05 NOTE — Patient Instructions (Addendum)
Thank you for coming to see me! Your fasting blood sugars aren't looking bad! I suspect that these are far lower than what you are seeing after meals.  1. Check your blood sugar first thing in the morning and/or 2 hours after your big meal of the day 2. Start Victoza:  Week 1:  0.6 mg under the skin once a day  Week 2: 1.2 mg under the skin once a day Week 3 and thereafter: 1.8 mg under the skin once a day 3. Increase your amlodipine (norvasc) to 10 mg once a day  GREAT JOB WITH THE TOOTSIE POPS, next is the orange juice!    We are going to apply to the patient assistance program with [employer]. Let me know if you get approved for Medicaid.

## 2018-01-05 NOTE — Assessment & Plan Note (Signed)
 #  ASCVD risk -secondary prevention in patient aged 62-75 with DM, baseline LDL 70-189. LDL well controlled less than 70 mg/dL at 40 mg/dL on last check 12/02/2017.  - Continued Aspirin 81 mg  - Continued atorvastatin 40 mg  #Hypertension longstanding currently uncontrolled.  Patient reports adherence with medication. Control is suboptimal due to dietary sodium intake, sedentary lifestyle, obesity. - Increase amlodipine to 10 mg daily - Continue all other medications - Reassess at next visit

## 2018-01-05 NOTE — Assessment & Plan Note (Signed)
#  Diabetes longstanding currently sub-optimally controlled evidenced by A1C; however, fastings near-controlled. Patient denies hypoglycemic events and is able to verbalize appropriate hypoglycemia management plan. Patient reports adherence with medication. Control is suboptimal due to meal pattern and dietary indiscretion, sedentary lifestyle.  - Continue metformin XR 1000 mg BID - Start Victoza 0.6 mg daily x 1 week, 1.2 mg daily x 1 week, then 1.8 mg thereafter. Chose this for both weight loss, ASCVD benefit and glycemic control. If patient is approved for Medicaid, this is now a formulary GLP1-RA. Filled out patient assistance application via [employer] for Victoza in case she is not approved for Medicaid. -Continued to counsel on dietary CHO and sugar impacts on glycemic control - Next A1C anticipated 3/08/ 2019.

## 2018-01-07 ENCOUNTER — Telehealth: Payer: Self-pay | Admitting: Pharmacy Technician

## 2018-01-07 MED ORDER — AMLODIPINE BESYLATE 10 MG PO TABS: 10.0000 mg | ORAL_TABLET | Freq: Every day | ORAL | 1 refills | Status: DC

## 2018-01-07 NOTE — Addendum Note (Signed)
Addended by: Deirdre Pippins E on: 01/07/2018 10:24 AM   Modules accepted: Orders

## 2018-01-07 NOTE — Telephone Encounter (Signed)
Patient eligible to receive medication assistance at [facility] through 2019, as long as eligibility requirements continue to be met.  [facility]

## 2018-01-14 ENCOUNTER — Ambulatory Visit: Admission: RE | Admit: 2018-01-14 | Payer: Self-pay | Source: Ambulatory Visit

## 2018-01-14 IMAGING — MG MM DIGITAL SCREENING BILAT W/ TOMO W/ CAD
8 of 14 series · 8 of 30 positions shown · non-contrast
Comparison: Previous exam(s).

CLINICAL DATA: Screening.

EXAM:
2D DIGITAL SCREENING BILATERAL MAMMOGRAM WITH CAD AND ADJUNCT TOMO

[R CC (1 of 2)]
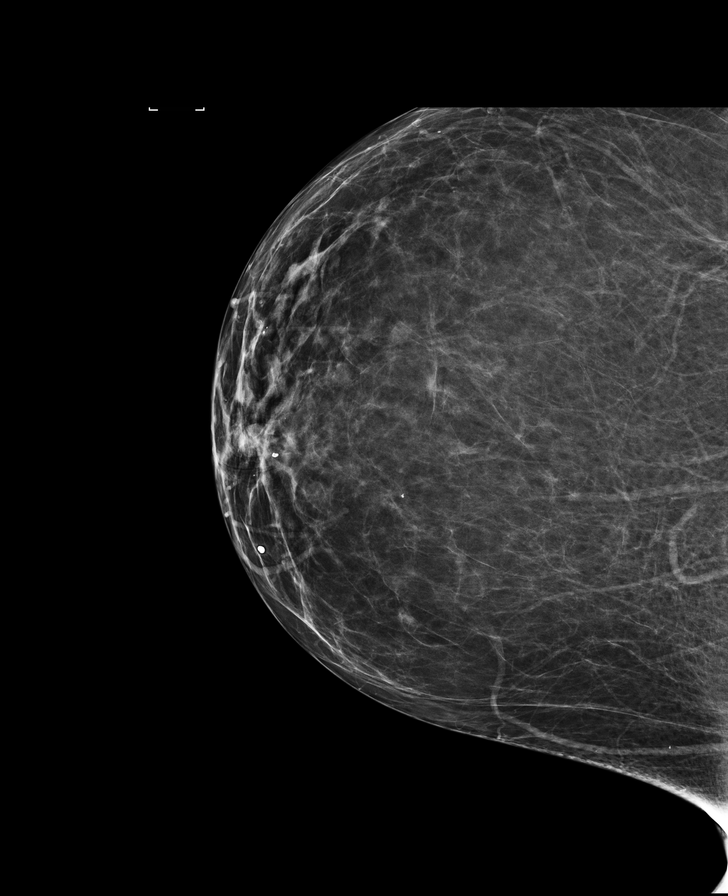

[L CC (1 of 2)]
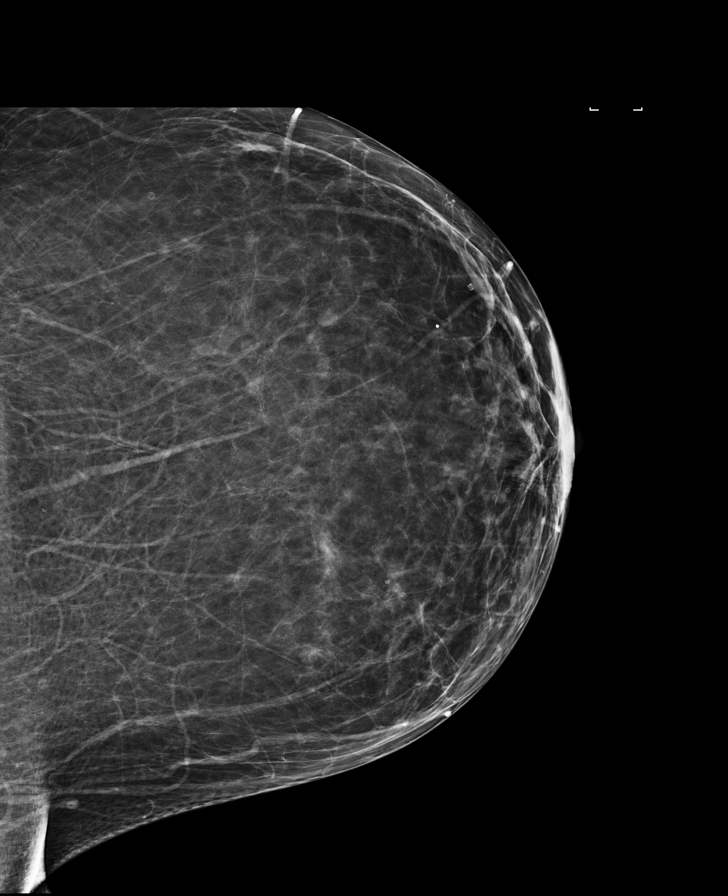

[L CC (2 of 2)]
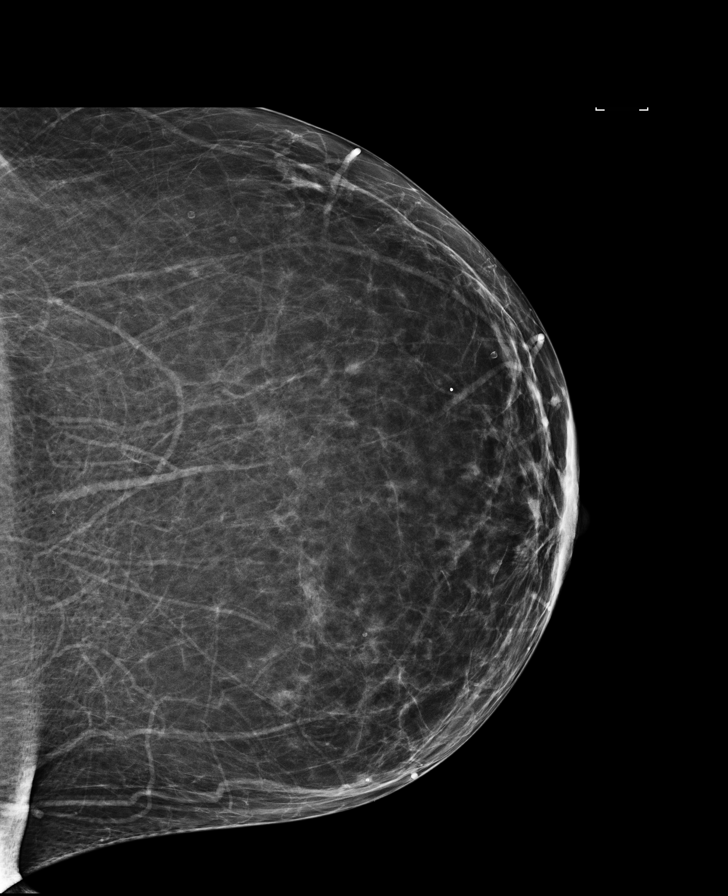

[L CC synth-2D]
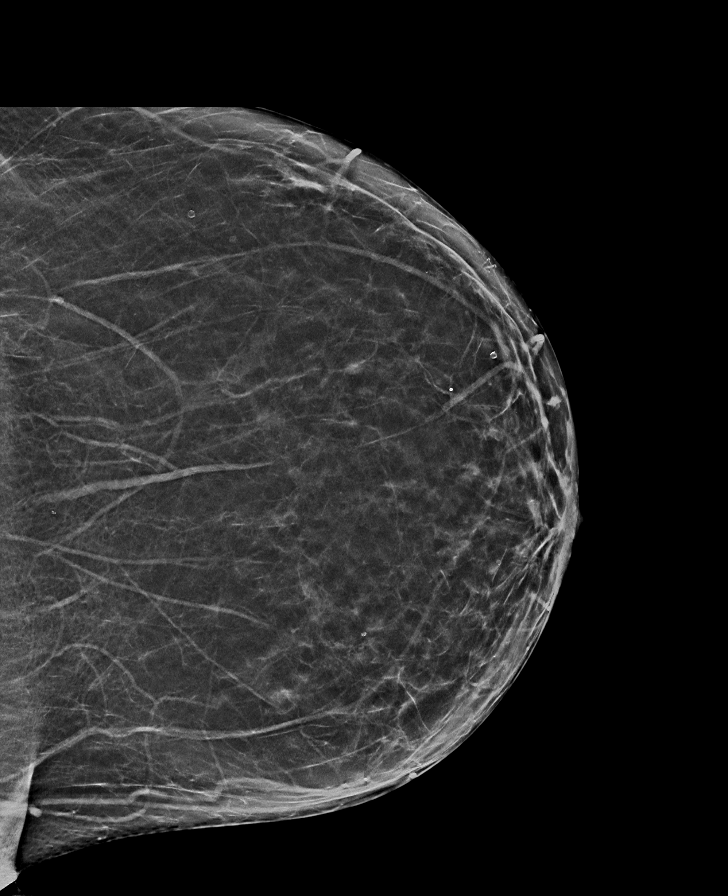

[R CC (2 of 2)]
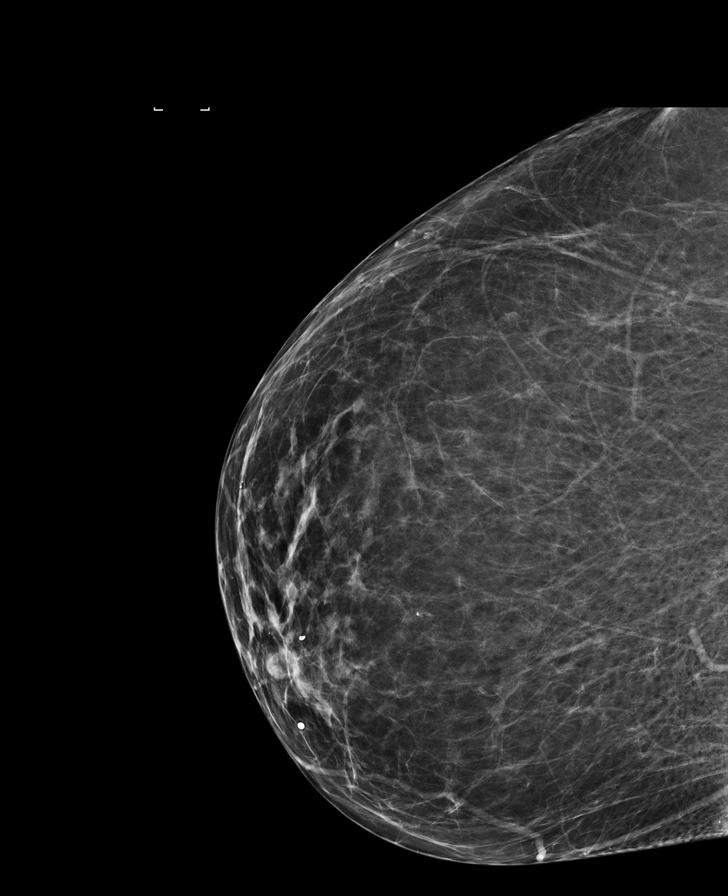

[R MLO]
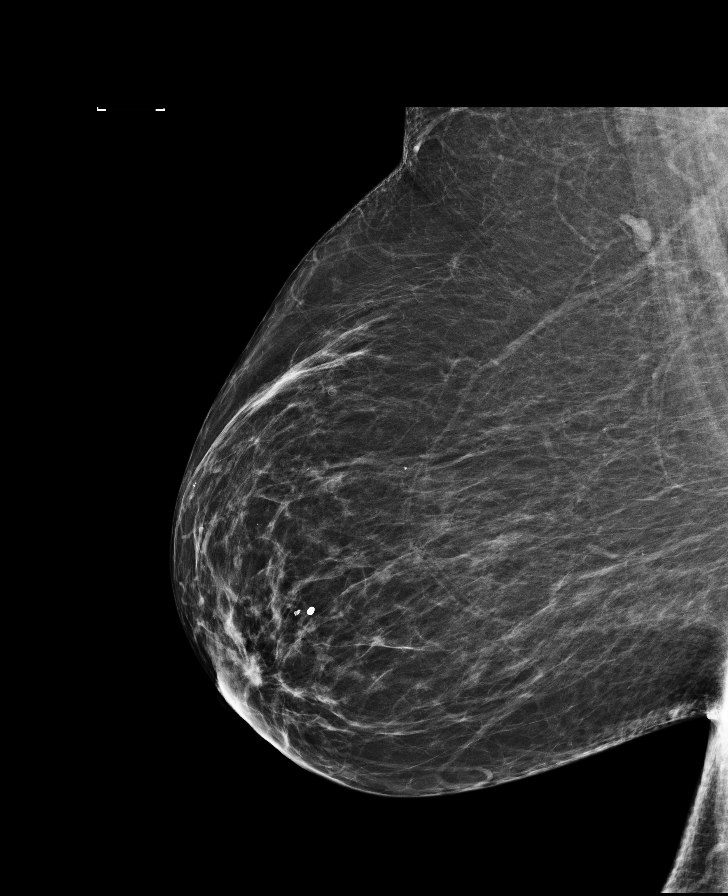

[L MLO synth-2D]
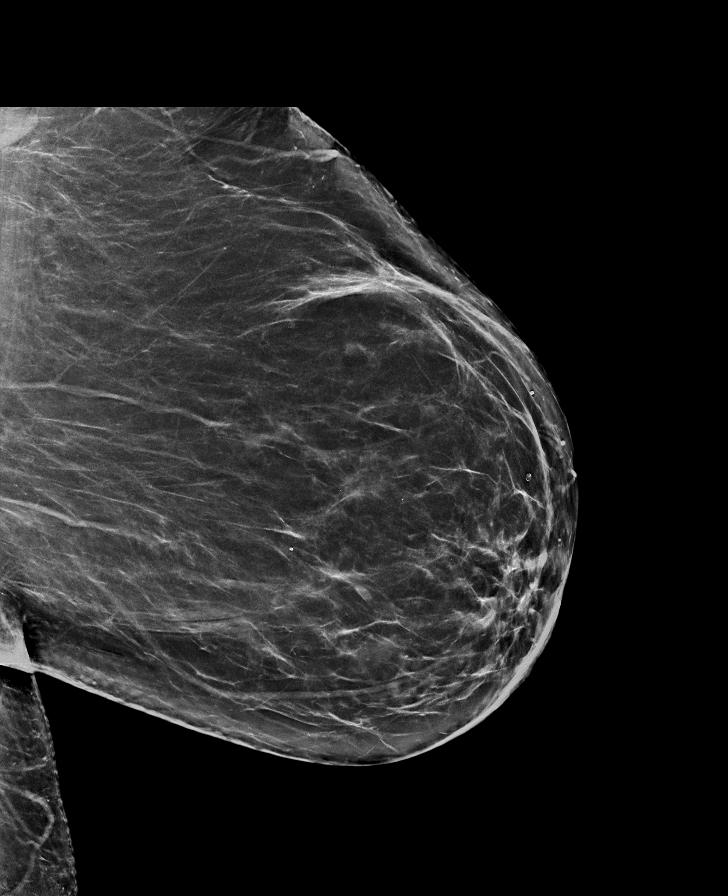

[L MLO]
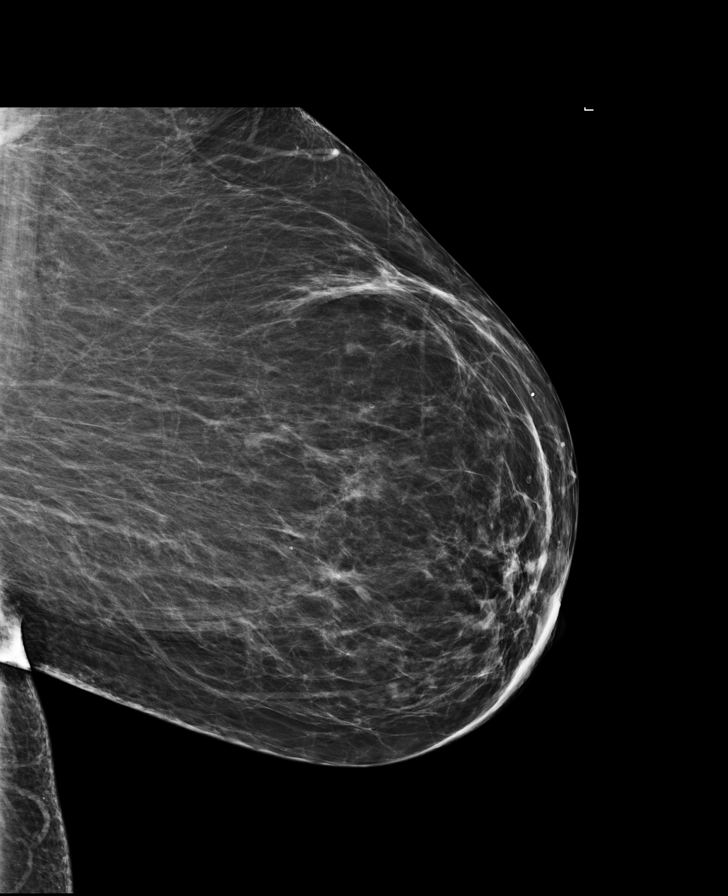

[8 of 30 positions shown; findings below may reference images not displayed]

ACR Breast Density Category b: There are scattered areas of
fibroglandular density.
FINDINGS: There are no findings suspicious for malignancy. Images were
processed with CAD.
IMPRESSION: No mammographic evidence of malignancy. A result letter of this
screening mammogram will be mailed directly to the patient.

RECOMMENDATION:
Screening mammogram in one year. (Code:97-6-RS4)

BI-RADS CATEGORY  1: Negative.

## 2018-01-19 ENCOUNTER — Other Ambulatory Visit: Payer: Self-pay | Admitting: Internal Medicine

## 2018-01-20 NOTE — Telephone Encounter (Signed)
Last OV 10/31/17 Next OV none scheduled Last refill 12/30/17

## 2018-01-21 ENCOUNTER — Ambulatory Visit: Admission: RE | Admit: 2018-01-21 | Payer: Self-pay | Source: Ambulatory Visit

## 2018-01-21 ENCOUNTER — Inpatient Hospital Stay: Admission: RE | Admit: 2018-01-21 | Payer: Self-pay | Source: Ambulatory Visit

## 2018-01-21 ENCOUNTER — Telehealth: Payer: Self-pay | Admitting: Internal Medicine

## 2018-01-21 ENCOUNTER — Ambulatory Visit
Admission: RE | Admit: 2018-01-21 | Discharge: 2018-01-21 | Disposition: A | Discharge status: Home / Self Care | Payer: Self-pay | Source: Ambulatory Visit | Attending: Nurse Practitioner | Admitting: Nurse Practitioner

## 2018-01-21 DIAGNOSIS — K760 Fatty (change of) liver, not elsewhere classified: Secondary | ICD-10-CM | POA: Insufficient documentation

## 2018-01-21 DIAGNOSIS — Z122 Encounter for screening for malignant neoplasm of respiratory organs: Secondary | ICD-10-CM

## 2018-01-21 DIAGNOSIS — Z87891 Personal history of nicotine dependence: Secondary | ICD-10-CM

## 2018-01-21 DIAGNOSIS — I2510 Atherosclerotic heart disease of native coronary artery without angina pectoris: Secondary | ICD-10-CM | POA: Insufficient documentation

## 2018-01-21 DIAGNOSIS — I700 Atherosclerosis of aorta: Secondary | ICD-10-CM | POA: Insufficient documentation

## 2018-01-21 NOTE — Telephone Encounter (Signed)
Copied from [place]. Topic: Quick Communication - Rx Refill/Question >> Jan 21, 2018 12:07 PM Oliver Pila B wrote: Medication: cyclobenzaprine (FLEXERIL) 10 MG tablet [[id number]]    Has the patient contacted their pharmacy? Yes.     (Agent: If no, request that the patient contact the pharmacy for the refill.)   Preferred Pharmacy (with phone number or street name): [facility]   Agent: Please be advised that RX refills may take up to 3 business days. We ask that you follow-up with your pharmacy.

## 2018-01-22 ENCOUNTER — Telehealth: Payer: Self-pay | Admitting: Pharmacist

## 2018-01-22 NOTE — Telephone Encounter (Signed)
Placed label on sample for patient, sample is in refrigerator  Lot: [id number] Exp: 02/2018

## 2018-01-22 NOTE — Telephone Encounter (Signed)
Called pharmacy and they did receive request but med is not due to refilled until March 8.. Pt called and notified.

## 2018-01-22 NOTE — Telephone Encounter (Signed)
Patient calls to request another sample of Victoza and follow up on [employer] application.   Per [employer] they are still missing one page of the application (that was re-faxed 2/26). Will refax this.   Medication Samples have been provided to the patient.  Drug name: Kirke Shaggy (using instead of Victoza, same drug, patient well educated and demonstrates ability to dial dose to 1.8 mg daily)       Strength: 1.8        Qty: 1 pen  LOT: see accompanying note from [employer] Exp.Date: 03/24/2018  Dosing instructions: Inject 1.8 mg under the skin daily  The patient has been instructed regarding the correct time, dose, and frequency of taking this medication, including desired effects and most common side effects.   Carlean Jews 11:51 AM 01/22/2018

## 2018-01-23 ENCOUNTER — Telehealth: Payer: Self-pay | Admitting: Internal Medicine

## 2018-01-23 NOTE — Telephone Encounter (Signed)
FYI

## 2018-01-23 NOTE — Telephone Encounter (Signed)
Copied from [place] [id number]. Topic: Quick Communication - See Telephone Encounter >> Jan 23, 2018  2:51 PM Robina Ade, Helene Kelp D wrote: CRM for notification. See Telephone encounter for: 01/23/18. Patient wanted to let Dr. Nicki Reaper know that she will have a upper endoscopy on March 11th.

## 2018-01-24 NOTE — Telephone Encounter (Signed)
Noted.  Let me know if she needs anything.  GI should give orders regarding meds needed to be held, etc prior to procedure.  Let me know if questions or problems.

## 2018-01-26 ENCOUNTER — Telehealth: Payer: Self-pay | Admitting: Pharmacist

## 2018-01-26 ENCOUNTER — Telehealth: Payer: Self-pay | Admitting: *Deleted

## 2018-01-26 NOTE — Telephone Encounter (Signed)
Notified patient of LDCT lung cancer screening program results with recommendation for 12 month follow up imaging. Also notified of incidental findings noted below and is encouraged to discuss further with PCP who will receive a copy of this note and/or the CT report. Patient verbalizes understanding.   IMPRESSION: 1. Lung-RADS 2S, benign appearance or behavior. Continue annual screening with low-dose chest CT without contrast in 12 months. 2. The "S" modifier above refers to potentially clinically significant non lung cancer related findings. Specifically, there is aortic atherosclerosis, in addition to left main and 3 vessel coronary artery disease. Please note that although the presence of coronary artery calcium documents the presence of coronary artery disease, the severity of this disease and any potential stenosis cannot be assessed on this non-gated CT examination. Assessment for potential risk factor modification, dietary therapy or pharmacologic therapy may be warranted, if clinically indicated. 3. 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 4. Hepatic steatosis.  Aortic Atherosclerosis (ICD10-I70.0).

## 2018-01-26 NOTE — Telephone Encounter (Signed)
01/26/18 Placed refill online with [facility] for Ventolin HFA & Advair 250/50, to release 02/06/18, order# [id number].Delos Haring

## 2018-01-26 NOTE — Telephone Encounter (Signed)
Notify pt that her CT scan reveals some changes in her arteries that suggest plaque build up.  She has seen cardiology previously.  I would like to refer her back to cardiology for further evaluation and question of need for any further cardiac testing.  If agreeable, let me know and I will place the order and let me know who she prefers to see.

## 2018-01-28 NOTE — Telephone Encounter (Signed)
Patient would like to see Dr. Nehemiah Massed but does not want to go at this time. She stated she would let us know when she is ready for referral.

## 2018-01-29 ENCOUNTER — Telehealth: Payer: Self-pay | Admitting: Internal Medicine

## 2018-01-29 NOTE — Telephone Encounter (Signed)
In order for patient to receive assistance with her medication it must be delivered to PCP office , medication is in POD A frige and patient is aware will pick up on 01/30/18.

## 2018-01-29 NOTE — Telephone Encounter (Signed)
We receive medication today and told Janett Billow would contact.  Thanks.

## 2018-01-30 ENCOUNTER — Encounter: Payer: Self-pay | Admitting: *Deleted

## 2018-02-02 ENCOUNTER — Ambulatory Visit: Payer: Self-pay | Admitting: Certified Registered Nurse Anesthetist

## 2018-02-02 ENCOUNTER — Encounter: Payer: Self-pay | Admitting: Certified Registered Nurse Anesthetist

## 2018-02-02 ENCOUNTER — Ambulatory Visit: Payer: Self-pay | Admitting: Pharmacist

## 2018-02-02 ENCOUNTER — Encounter
Admission: RE | Disposition: A | Discharge status: Home / Self Care | Payer: Self-pay | Source: Ambulatory Visit | Attending: Unknown Physician Specialty

## 2018-02-02 ENCOUNTER — Ambulatory Visit
Admission: RE | Admit: 2018-02-02 | Discharge: 2018-02-02 | Disposition: A | Discharge status: Home / Self Care | Payer: Self-pay | Source: Ambulatory Visit | Attending: Unknown Physician Specialty | Admitting: Unknown Physician Specialty

## 2018-02-02 DIAGNOSIS — R197 Diarrhea, unspecified: Secondary | ICD-10-CM | POA: Insufficient documentation

## 2018-02-02 DIAGNOSIS — I2510 Atherosclerotic heart disease of native coronary artery without angina pectoris: Secondary | ICD-10-CM | POA: Insufficient documentation

## 2018-02-02 DIAGNOSIS — F17210 Nicotine dependence, cigarettes, uncomplicated: Secondary | ICD-10-CM | POA: Insufficient documentation

## 2018-02-02 DIAGNOSIS — Z9049 Acquired absence of other specified parts of digestive tract: Secondary | ICD-10-CM | POA: Insufficient documentation

## 2018-02-02 DIAGNOSIS — Z79899 Other long term (current) drug therapy: Secondary | ICD-10-CM | POA: Insufficient documentation

## 2018-02-02 DIAGNOSIS — M479 Spondylosis, unspecified: Secondary | ICD-10-CM | POA: Insufficient documentation

## 2018-02-02 DIAGNOSIS — I739 Peripheral vascular disease, unspecified: Secondary | ICD-10-CM | POA: Insufficient documentation

## 2018-02-02 DIAGNOSIS — K760 Fatty (change of) liver, not elsewhere classified: Secondary | ICD-10-CM | POA: Insufficient documentation

## 2018-02-02 DIAGNOSIS — K210 Gastro-esophageal reflux disease with esophagitis: Secondary | ICD-10-CM | POA: Insufficient documentation

## 2018-02-02 DIAGNOSIS — Z7984 Long term (current) use of oral hypoglycemic drugs: Secondary | ICD-10-CM | POA: Insufficient documentation

## 2018-02-02 DIAGNOSIS — G4730 Sleep apnea, unspecified: Secondary | ICD-10-CM | POA: Insufficient documentation

## 2018-02-02 DIAGNOSIS — I252 Old myocardial infarction: Secondary | ICD-10-CM | POA: Insufficient documentation

## 2018-02-02 DIAGNOSIS — Z7982 Long term (current) use of aspirin: Secondary | ICD-10-CM | POA: Insufficient documentation

## 2018-02-02 DIAGNOSIS — Z7951 Long term (current) use of inhaled steroids: Secondary | ICD-10-CM | POA: Insufficient documentation

## 2018-02-02 DIAGNOSIS — D509 Iron deficiency anemia, unspecified: Secondary | ICD-10-CM | POA: Insufficient documentation

## 2018-02-02 DIAGNOSIS — J449 Chronic obstructive pulmonary disease, unspecified: Secondary | ICD-10-CM | POA: Insufficient documentation

## 2018-02-02 DIAGNOSIS — Z8719 Personal history of other diseases of the digestive system: Secondary | ICD-10-CM | POA: Insufficient documentation

## 2018-02-02 DIAGNOSIS — E781 Pure hyperglyceridemia: Secondary | ICD-10-CM | POA: Insufficient documentation

## 2018-02-02 DIAGNOSIS — Z8249 Family history of ischemic heart disease and other diseases of the circulatory system: Secondary | ICD-10-CM | POA: Insufficient documentation

## 2018-02-02 DIAGNOSIS — I10 Essential (primary) hypertension: Secondary | ICD-10-CM | POA: Insufficient documentation

## 2018-02-02 DIAGNOSIS — Z8379 Family history of other diseases of the digestive system: Secondary | ICD-10-CM | POA: Insufficient documentation

## 2018-02-02 DIAGNOSIS — E119 Type 2 diabetes mellitus without complications: Secondary | ICD-10-CM | POA: Insufficient documentation

## 2018-02-02 DIAGNOSIS — Z955 Presence of coronary angioplasty implant and graft: Secondary | ICD-10-CM | POA: Insufficient documentation

## 2018-02-02 DIAGNOSIS — F329 Major depressive disorder, single episode, unspecified: Secondary | ICD-10-CM | POA: Insufficient documentation

## 2018-02-02 DIAGNOSIS — K2950 Unspecified chronic gastritis without bleeding: Secondary | ICD-10-CM | POA: Insufficient documentation

## 2018-02-02 DIAGNOSIS — Z90710 Acquired absence of both cervix and uterus: Secondary | ICD-10-CM | POA: Insufficient documentation

## 2018-02-02 DIAGNOSIS — Z800 Family history of malignant neoplasm of digestive organs: Secondary | ICD-10-CM | POA: Insufficient documentation

## 2018-02-02 DIAGNOSIS — M170 Bilateral primary osteoarthritis of knee: Secondary | ICD-10-CM | POA: Insufficient documentation

## 2018-02-02 DIAGNOSIS — D649 Anemia, unspecified: Secondary | ICD-10-CM | POA: Insufficient documentation

## 2018-02-02 DIAGNOSIS — Z888 Allergy status to other drugs, medicaments and biological substances status: Secondary | ICD-10-CM | POA: Insufficient documentation

## 2018-02-02 DIAGNOSIS — Z96653 Presence of artificial knee joint, bilateral: Secondary | ICD-10-CM | POA: Insufficient documentation

## 2018-02-02 HISTORY — DX: Tobacco use: Z72.0

## 2018-02-02 HISTORY — PX: ESOPHAGOGASTRODUODENOSCOPY (EGD) WITH PROPOFOL: SHX5813

## 2018-02-02 HISTORY — DX: Fatty (change of) liver, not elsewhere classified: K76.0

## 2018-02-02 HISTORY — DX: Noninfective gastroenteritis and colitis, unspecified: K52.9

## 2018-02-02 LAB — GLUCOSE, CAPILLARY: Glucose-Capillary: 117 mg/dL — ABNORMAL HIGH (ref 65–99)

## 2018-02-02 SURGERY — ESOPHAGOGASTRODUODENOSCOPY (EGD) WITH PROPOFOL
Anesthesia: General

## 2018-02-02 MED ORDER — PIPERACILLIN-TAZOBACTAM 3.375 G IVPB
3.3750 g | Freq: Once | INTRAVENOUS | Status: AC
  Administered 2018-02-02: 3.375 g via INTRAVENOUS

## 2018-02-02 MED ORDER — LIDOCAINE HCL (PF) 2 % IJ SOLN
INTRAMUSCULAR | Status: AC
  Filled 2018-02-02: qty 10

## 2018-02-02 MED ORDER — PROPOFOL 500 MG/50ML IV EMUL
INTRAVENOUS | Status: AC
  Filled 2018-02-02: qty 50

## 2018-02-02 MED ORDER — LIDOCAINE HCL (CARDIAC) 20 MG/ML IV SOLN
INTRAVENOUS | Status: DC | PRN
  Administered 2018-02-02: 50 mg via INTRAVENOUS

## 2018-02-02 MED ORDER — PROPOFOL 500 MG/50ML IV EMUL
INTRAVENOUS | Status: DC | PRN
  Administered 2018-02-02: 140 ug/kg/min via INTRAVENOUS

## 2018-02-02 MED ORDER — SODIUM CHLORIDE 0.9 % IV SOLN: INTRAVENOUS | Status: DC

## 2018-02-02 MED ORDER — SODIUM CHLORIDE 0.9 % IV SOLN
INTRAVENOUS | Status: DC
  Administered 2018-02-02: 10:00:00 via INTRAVENOUS

## 2018-02-02 MED ORDER — PROPOFOL 10 MG/ML IV BOLUS
INTRAVENOUS | Status: DC | PRN
  Administered 2018-02-02: 100 mg via INTRAVENOUS

## 2018-02-02 MED ORDER — PIPERACILLIN-TAZOBACTAM 3.375 G IVPB
INTRAVENOUS | Status: AC
  Administered 2018-02-02: 3.375 g via INTRAVENOUS
  Filled 2018-02-02: qty 50

## 2018-02-02 NOTE — H&P (Signed)
Primary Care Physician:  Einar Pheasant, MD Primary Gastroenterologist:  Dr. Vira Agar  Pre-Procedure History & Physical: HPI:  Kathryn Friedman is a 62 y.o. female is here for an endoscopy.  This is for iron def anemia in patient with previous gastric ulcer.   Past Medical History:  Diagnosis Date  . Anemia   . Arthritis    back and knees  . Asthma   . Chronic diarrhea   . COPD (chronic obstructive pulmonary disease) ([place])   . Coronary artery disease    s/p stent placement 06/25/06  . Depression    secondary to the death of her husband (died 86)  . Diabetes mellitus without complication ([place]) Diagnosed 05/2008  . Diverticulitis   . Fatty infiltration of liver   . GERD (gastroesophageal reflux disease)   . Hypertension   . Hypertriglyceridemia   . Sleep apnea    on CPAP  . Spastic colon   . Tobacco abuse     Past Surgical History:  Procedure Laterality Date  . ABDOMINAL HYSTERECTOMY  with left ovary in place 1996  . APPENDECTOMY  1985  . BREAST BIOPSY Left 10/14/2017   affirm bx for calc, ribbon clip  path pending  . CESAREAN SECTION  1984  . CHOLECYSTECTOMY  1985  . COLONOSCOPY WITH PROPOFOL N/A 09/13/2016   Procedure: COLONOSCOPY WITH PROPOFOL;  Surgeon: Manya Silvas, MD;  Location: [facility] ENDOSCOPY;  Service: Endoscopy;  Laterality: N/A;  . JOINT REPLACEMENT     bilateral knee replacements  . KNEE ARTHROSCOPY  Arthroscopic left knee surgery   . KNEE SURGERY  status post knee surgey   . REPLACEMENT TOTAL KNEE  ([facility])  . SHOULDER SURGERY  shoulder operation secondary to a torn tendon    Prior to Admission medications   Medication Sig Start Date End Date Taking? Authorizing Provider  albuterol (VENTOLIN HFA) 108 (90 BASE) MCG/ACT inhaler Inhale 2 puffs into the lungs every 4 (four) hours as needed. Patient taking differently: Inhale 2 puffs into the lungs every 4 (four) hours as needed. 2 puffs twice daily 05/01/15  Yes Einar Pheasant, MD  amLODipine  (NORVASC) 10 MG tablet Take 1 tablet (10 mg total) by mouth daily. 01/07/18  Yes Einar Pheasant, MD  atorvastatin (LIPITOR) 40 MG tablet TAKE ONE TABLET BY MOUTH EVERY EVENING 08/11/17  Yes Einar Pheasant, MD  Brexpiprazole (REXULTI) 2 MG TABS Take 2 mg by mouth daily.   Yes [provider]  Calcium Carb-Cholecalciferol (CALCIUM-VITAMIN D) 500-200 MG-UNIT tablet Take 1 tablet by mouth daily.   Yes [provider]  citalopram (CELEXA) 40 MG tablet Take 40 mg at bedtime by mouth.   Yes Ahluwalia, Shamsher S, MD  colestipol (COLESTID) 1 g tablet Take 2 g by mouth daily.   Yes [provider]  cyclobenzaprine (FLEXERIL) 10 MG tablet TAKE ONE TABLET BY MOUTH 3 TIMES A DAY AS NEEDED FOR MUSCLE SPASMS 01/20/18  Yes Einar Pheasant, MD  DULoxetine (CYMBALTA) 60 MG capsule Take 60 mg by mouth daily.   Yes [provider]  Fluticasone-Salmeterol (ADVAIR) 250-50 MCG/DOSE AEPB Inhale 1 puff into the lungs every 12 (twelve) hours. 05/06/16  Yes Einar Pheasant, MD  liraglutide (VICTOZA) 18 MG/3ML SOPN Inject 0.6 mg subcutaneously daily for 1 week, then increase to 1.2 mg daily for 1 week, then increase to 1.8 mg daily thereafter. 01/05/18  Yes Einar Pheasant, MD  metFORMIN (GLUCOPHAGE-XR) 500 MG 24 hr tablet TAKE TWO TABLETS (1000MG ) BY MOUTH 2 TIMES A DAY  12/08/17  Yes Einar Pheasant, MD  pantoprazole (PROTONIX) 40 MG tablet TAKE ONE TABLET BY MOUTH EVERY DAY 11/05/17  Yes Einar Pheasant, MD  propranolol (INDERAL) 20 MG tablet TAKE TWO TABLETS BY MOUTH 2 TIMES A DAY. Patient taking differently: Take 20 mg by mouth 3 (three) times daily. TAKE TWO TABLETS BY MOUTH 2 TIMES A DAY. 08/06/16  Yes Einar Pheasant, MD  traZODone (DESYREL) 100 MG tablet Take 200 mg by mouth at bedtime.    Yes [provider]  aspirin 81 MG tablet Take 81 mg by mouth daily. Take 1 tablet by mouth once a day as directed    [provider]  clopidogrel (PLAVIX) 75 MG tablet TAKE ONE TABLET  BY MOUTH EVERY DAY 12/01/17   Einar Pheasant, MD  nystatin cream (MYCOSTATIN) Apply 1 application topically 2 (two) times daily. 11/24/17   Einar Pheasant, MD  NYSTATIN powder Apply topically 2 (two) times daily. 11/14/17   Einar Pheasant, MD    Allergies as of 01/28/2018 - Review Complete 01/05/2018  Allergen Reaction Noted  . Chantix [varenicline] Other (See Comments) 12/15/2017  . Jardiance [empagliflozin] Other (See Comments) 12/22/2017    Family History  Problem Relation Age of Onset  . Other Mother        Hit by a fire truck and has had multiple operations on her back , and has history of MVP   . Mitral valve prolapse Mother   . Heart disease Father        myocardial infarction and is status post bypass surgery  . Mitral valve prolapse Sister   . Hepatitis C Brother   . Cirrhosis Brother   . Colon cancer Paternal Aunt     Social History   Socioeconomic History  . Marital status: Married    Spouse name: Not on file  . Number of children: 1  . Years of education: Not on file  . Highest education level: Not on file  Social Needs  . Financial resource strain: Not on file  . Food insecurity - worry: Not on file  . Food insecurity - inability: Not on file  . Transportation needs - medical: Not on file  . Transportation needs - non-medical: Not on file  Occupational History    Employer: [employer]  Tobacco Use  . Smoking status: Current Every Day Smoker    Packs/day: 2.00    Years: 45.00    Pack years: 90.00    Types: Cigarettes  . Smokeless tobacco: Never Used  Substance and Sexual Activity  . Alcohol use: No    Alcohol/week: 0.0 oz  . Drug use: No  . Sexual activity: Not on file  Other Topics Concern  . Not on file  Social History Narrative  . Not on file    Review of Systems: See HPI, otherwise negative ROS  Physical Exam: BP (!) 147/74   Pulse 87   Temp (!) 97.3 F (36.3 C) (Tympanic)   Resp 18   Ht 5\' 4"  (1.626 m)   Wt 113.4 kg (250 lb)   SpO2 97%    BMI 42.91 kg/m  General:   Alert,  pleasant and cooperative in NAD Head:  Normocephalic and atraumatic. Neck:  Supple; no masses or thyromegaly. Lungs:  Clear throughout to auscultation.    Heart:  Regular rate and rhythm. Abdomen:  Soft, nontender and nondistended. Normal bowel sounds, without guarding, and without rebound.   Neurologic:  Alert and  oriented x4;  grossly normal neurologically.  Impression/Plan:  Kathryn Friedman is here for an endoscopy to be performed for iron def anemia and history of gastric ulcer.  Risks, benefits, limitations, and alternatives regarding  endoscopy have been reviewed with the patient.  Questions have been answered.  All parties agreeable.   Gaylyn Cheers, MD  02/02/2018, 11:03 AM

## 2018-02-02 NOTE — Anesthesia Postprocedure Evaluation (Signed)
Anesthesia Post Note  Patient: Kathryn Friedman  Procedure(s) Performed: ESOPHAGOGASTRODUODENOSCOPY (EGD) WITH PROPOFOL (N/A )  Patient location during evaluation: PACU Anesthesia Type: General Level of consciousness: awake and alert and oriented Pain management: pain level controlled Vital Signs Assessment: post-procedure vital signs reviewed and stable Respiratory status: spontaneous breathing Cardiovascular status: blood pressure returned to baseline Anesthetic complications: no     Last Vitals:  Vitals:   02/02/18 1133 02/02/18 1143  BP: 108/63 108/80  Pulse: 79 79  Resp: (!) 24 20  Temp:    SpO2: 95% 98%    Last Pain:  Vitals:   02/02/18 1123  TempSrc: Tympanic                 Mang Hazelrigg

## 2018-02-02 NOTE — Anesthesia Post-op Follow-up Note (Signed)
Anesthesia QCDR form completed.        

## 2018-02-02 NOTE — Anesthesia Preprocedure Evaluation (Signed)
Anesthesia Evaluation  Patient identified by MRN, date of birth, ID band Patient awake    Reviewed: Allergy & Precautions, H&P , NPO status , Patient's Chart, lab work & pertinent test results  Airway Mallampati: III  TM Distance: <3 FB Neck ROM: limited    Dental  (+) Poor Dentition, Chipped   Pulmonary shortness of breath and with exertion, asthma , sleep apnea and Continuous Positive Airway Pressure Ventilation , COPD,  COPD inhaler, Current Smoker,    Pulmonary exam normal breath sounds clear to auscultation       Cardiovascular Exercise Tolerance: Good hypertension, (-) angina+ CAD, + Past MI, + Cardiac Stents and + Peripheral Vascular Disease  Normal cardiovascular exam Rhythm:regular Rate:Normal     Neuro/Psych PSYCHIATRIC DISORDERS Depression negative neurological ROS     GI/Hepatic Neg liver ROS, GERD  Controlled,  Endo/Other  diabetes, Type 2  Renal/GU negative Renal ROS  negative genitourinary   Musculoskeletal  (+) Arthritis ,   Abdominal   Peds  Hematology negative hematology ROS (+) anemia ,   Anesthesia Other Findings Past Medical History: No date: Anemia No date: Arthritis     Comment: back and knees No date: Asthma No date: COPD (chronic obstructive pulmonary disease) (* No date: Coronary artery disease     Comment: s/p stent placement 06/25/06 No date: Depression     Comment: secondary to the death of her husband (died               25) Diagnosed 05/2008: Diabetes mellitus without complication ([place]) No date: Diverticulitis No date: GERD (gastroesophageal reflux disease) No date: Hypertension No date: Hypertriglyceridemia No date: Sleep apnea     Comment: on CPAP No date: Spastic colon  Past Surgical History: with left ovary in place 1996: ABDOMINAL HYSTERECTOMY 1985: APPENDECTOMY 1984: [place]: CHOLECYSTECTOMY Arthroscopic left knee surgery : KNEE ARTHROSCOPY status  post knee surgey : KNEE SURGERY ([facility]): REPLACEMENT TOTAL KNEE shoulder operation secondary to a torn tendon: SHOULDER SURGERY  BMI    Body Mass Index:  38.96 kg/m      Reproductive/Obstetrics negative OB ROS                             Anesthesia Physical  Anesthesia Plan  ASA: IV  Anesthesia Plan: General   Post-op Pain Management:    Induction:   PONV Risk Score and Plan:   Airway Management Planned: Nasal Cannula  Additional Equipment:   Intra-op Plan:   Post-operative Plan:   Informed Consent: I have reviewed the patients History and Physical, chart, labs and discussed the procedure including the risks, benefits and alternatives for the proposed anesthesia with the patient or authorized representative who has indicated his/her understanding and acceptance.   Dental Advisory Given  Plan Discussed with: Anesthesiologist, CRNA and Surgeon  Anesthesia Plan Comments: (Patient informed that they are higher risk for complications from anesthesia during this procedure due to their medical history.  Patient voiced understanding. )        Anesthesia Quick Evaluation

## 2018-02-02 NOTE — Op Note (Signed)
[facility] Gastroenterology Patient Name: Kathryn Friedman Procedure Date: 02/02/2018 11:08 AM MRN: [id number] Account #: [id number] Date of Birth: [birth year] Admit Type: Outpatient Age: 62 Room: [facility] ENDO ROOM 3 Gender: Female Note Status: Finalized Procedure:            Upper GI endoscopy Indications:          Epigastric abdominal pain, Diarrhea Providers:            Manya Silvas, MD Referring MD:         Einar Pheasant, MD (Referring MD) Medicines:            Propofol per Anesthesia Complications:        No immediate complications. Procedure:            Pre-Anesthesia Assessment:                       - After reviewing the risks and benefits, the patient                        was deemed in satisfactory condition to undergo the                        procedure.                       After obtaining informed consent, the endoscope was                        passed under direct vision. Throughout the procedure,                        the patient's blood pressure, pulse, and oxygen                        saturations were monitored continuously. The Endoscope                        was introduced through the mouth, and advanced to the                        second part of duodenum. The upper GI endoscopy was                        accomplished without difficulty. The patient tolerated                        the procedure well. Findings:      The examined esophagus was normal. Biopsies were taken with a cold       forceps for histology. GEJ 39cm.      Diffuse mild inflammation characterized by erythema and granularity was       found in the gastric body and in the gastric antrum. Biopsies were taken       with a cold forceps for histology. Biopsies were taken with a cold       forceps for Helicobacter pylori testing.      The examined duodenum was normal. Impression:           - Normal esophagus. Biopsied.                       - Gastritis.  Biopsied.                       - Normal examined duodenum. Recommendation:       - Await pathology results. Carafate three times a day.                        Take Imodium before bed time. Follow up in office one                        month. Manya Silvas, MD 02/02/2018 11:24:08 AM This report has been signed electronically. Number of Addenda: 0 Note Initiated On: 02/02/2018 11:08 AM      [facility]

## 2018-02-02 NOTE — Transfer of Care (Signed)
Immediate Anesthesia Transfer of Care Note  Patient: Kathryn Friedman  Procedure(s) Performed: ESOPHAGOGASTRODUODENOSCOPY (EGD) WITH PROPOFOL (N/A )  Patient Location: PACU and Endoscopy Unit  Anesthesia Type:General  Level of Consciousness: drowsy  Airway & Oxygen Therapy: Patient Spontanous Breathing and Patient connected to nasal cannula oxygen  Post-op Assessment: Report given to RN and Post -op Vital signs reviewed and stable  Post vital signs: Reviewed and stable  Last Vitals:  Vitals:   02/02/18 0954  BP: (!) 147/74  Pulse: 87  Resp: 18  Temp: (!) 36.3 C  SpO2: 97%    Last Pain:  Vitals:   02/02/18 0954  TempSrc: Tympanic         Complications: No apparent anesthesia complications

## 2018-02-03 ENCOUNTER — Encounter: Payer: Self-pay | Admitting: Unknown Physician Specialty

## 2018-02-03 LAB — SURGICAL PATHOLOGY

## 2018-02-09 ENCOUNTER — Other Ambulatory Visit: Payer: Self-pay | Admitting: Internal Medicine

## 2018-02-09 ENCOUNTER — Ambulatory Visit (INDEPENDENT_AMBULATORY_CARE_PROVIDER_SITE_OTHER): Payer: Self-pay | Admitting: Pharmacist

## 2018-02-09 ENCOUNTER — Encounter: Payer: Self-pay | Admitting: Pharmacist

## 2018-02-09 VITALS — BP 127/70 | HR 79 | Ht 64.0 in | Wt 251.0 lb

## 2018-02-09 DIAGNOSIS — I10 Essential (primary) hypertension: Secondary | ICD-10-CM

## 2018-02-09 DIAGNOSIS — Z87891 Personal history of nicotine dependence: Secondary | ICD-10-CM

## 2018-02-09 DIAGNOSIS — E119 Type 2 diabetes mellitus without complications: Secondary | ICD-10-CM

## 2018-02-09 NOTE — Progress Notes (Addendum)
S:     Chief Complaint  Patient presents with  . Medication Management    Diabetes    Patient arrives in good spirits, ambulating without assistance.  Presents for diabetes evaluation, education, and management at the request of Dr. Scott(referred on 11/02/2018). Last seen by primary care provider on1/06/2018. Last Rx Clinic visit on 01/05/2018 - at that time, patient was started on Victoza as she was ?approved for medicaid and this is formulary GLP1.   Today patient reports she is tolerating increased amlodipine and addition of victoza well. Has picked up supply of victoza. Reports occasional dizziness ("been like this a long time"), denies falls/syncope/CP. Still smoking 1-1.5 packs/day. Not interested in quitting at this time. Reports medication changes of protonix increase and carafate start after diagnostic endoscopy. Denies n/v or GI upset. Brings in CBG log with 2hr post-prandial readings for review.   Insurance coverage/medication affordability: ?medicaid approval, gets meds through [facility] and patient assistance. Approved for victoza through March, 2019.   Patient reports adherence with medications.  Current diabetes medications include: victoza 1.8 mg daily, metformin 1000 mg BID Current hypertension medications include: propranolol 40 mg BID, amlodipine 10 mg daily   Patient denies hypoglycemic events.  Patient reported dietary habits: Eats 1-2 "meals"/day, considers other intake snacks Breakfast: 2 eggs over medium, small bowl of grits, piece of country ham, 1 slice of bread Lunch:Chinese (shrimp lo mein, rice, shrimp egg roll) 1/2 serving  Dinner: 1/2 fish sandwich and cheddar bacon fries Snacks:2 lollipops Drinks: cut down from 4-5 strawberry sodas to 2/day now, glass of tea, cup of coffee with sweet and low.   Patient reported exercise habits: none   O:  Physical Exam  Constitutional: She appears well-developed and well-nourished.  Musculoskeletal: She exhibits no  edema.     Review of Systems  Gastrointestinal: Negative for abdominal pain, nausea and vomiting.  All other systems reviewed and are negative.   Lab Results  Component Value Date   HGBA1C 7.6 (H) 12/02/2017   Vitals:   02/09/18 1301  BP: 127/70  Pulse: 79    Lipid Panel     Component Value Date/Time   CHOL 93 12/02/2017 0932   TRIG 90.0 12/02/2017 0932   HDL 35.70 (L) 12/02/2017 0932   CHOLHDL 3 12/02/2017 0932   VLDL 18.0 12/02/2017 0932   LDLCALC 40 12/02/2017 0932   2 hour post-prandial/random CBG: 140s-190s, excursions to 230, 218, 231, 202  GFR 12/02/2017 - 98 K 12/02/2017 - 4.3 Scr 12/02/2017 - 0.65  CrCl calculated using adjusted body weight = ~115 ml/min Urine microalbumin 08/14/17 <0.7 Microalbumin:Cr, urine 08/14/17 3.0    Clinical ASCVD:Yes; High-risk conditions: Age 62 or older, h/o PCIx 2 stents, DM, HTN, current smoking   A/P: #Diabetes longstanding currentlysub-optimally controlled evidenced by A1C; however, 2hr post-prandials largely controlled. Patientdenieshypoglycemic eventsand is able to verbalize appropriate hypoglycemia management plan.Patient reportsadherence with medication. Control is suboptimal due tomeal pattern and dietary indiscretion, sedentary lifestyle. Tolerating Victoza well.  - Continue metformin XR 1000 mg BID - Continue Victoza 1.8 mg daily. Patient requests this medication (obtaining from PAP) be made so she can pick it up from [facility].  -Continued to counsel on dietary CHO and sugar impacts on glycemic control - Next A1C anticipated4/08/ 2019 or later   #ASCVD risk -secondaryprevention in patient aged 16-75 with DM, baseline LDL 70-189. LDL well controlled less than 70 mg/dL at 40 mg/dL on last check 12/02/2017. -ContinuedAspirin 81mg   -Continuedatorvastatin40mg   #Hypertensionlongstandingcurrently controlled. Patient reportsadherence with  medication and is tolerating increased amlodipine well. No edema  present. Control is suboptimal due todietary sodium intake, sedentary lifestyle, obesity. - Continue amlodipine to 10 mg daily - Continue all other medications  #Tobacco abuse - ongoing, patient in pre-contemplative stage.  -Discussed health benefits of quitting -Offered to discuss smoking cessation pharmacologic therapies - patient declines at this time  Written patient instructions provided.  Total time in face to face counseling 30 minutes.    Follow up in Pharmacist Clinic Visit 6 weeks.   Carlean Jews, Pharm.D., BCPS, CPP PGY2 Ambulatory Care Pharmacy Resident Phone: [phone]  Reviewed above information.  Agree with assessment and plan.    Dr Nicki Reaper

## 2018-02-09 NOTE — Patient Instructions (Signed)
Thanks for coming to see me today! You've made a lot of progress. Next is INCREASING your walking to 10 minutes three times a week and working on MORE VEGETABLES, MORE LEAN PROTEIN, LESS CARB (noodles, rice, pasta, potatoes, fried foods).   No change to medications.   Come back to see me in 6 weeks.

## 2018-02-10 NOTE — Assessment & Plan Note (Signed)
 #  ASCVD risk -secondaryprevention in patient aged 62-75 with DM, baseline LDL 70-189. LDL well controlled less than 70 mg/dL at 40 mg/dL on last check 12/02/2017. -ContinuedAspirin 81mg   -Continuedatorvastatin40mg   #Hypertensionlongstandingcurrently controlled. Patient reportsadherence with medication and is tolerating increased amlodipine well. No edema present. Control is suboptimal due todietary sodium intake, sedentary lifestyle, obesity. - Increase amlodipine to 10 mg daily - Continue all other medications

## 2018-02-10 NOTE — Assessment & Plan Note (Signed)
#  Diabetes longstanding currentlysub-optimally controlled evidenced by A1C; however, 2hr post-prandials largely controlled. Patientdenieshypoglycemic eventsand is able to verbalize appropriate hypoglycemia management plan.Patient reportsadherence with medication. Control is suboptimal due tomeal pattern and dietary indiscretion, sedentary lifestyle. Tolerating Victoza well.  - Continue metformin XR 1000 mg BID - Continue Victoza 1.8 mg daily. Patient requests this medication (obtaining from PAP) be made so she can pick it up from [facility].  -Continued to counsel on dietary CHO and sugar impacts on glycemic control - Next A1C anticipated4/08/ 2019 or later

## 2018-02-10 NOTE — Assessment & Plan Note (Signed)
#  Tobacco abuse - ongoing, patient in pre-contemplative stage.  -Discussed health benefits of quitting -Offered to discuss smoking cessation pharmacologic therapies - patient declines at this time

## 2018-02-13 ENCOUNTER — Telehealth: Payer: Self-pay | Admitting: Pharmacist

## 2018-02-13 NOTE — Telephone Encounter (Signed)
/  22/2019 3:24:51 PM - Victoza pens & tips  02/13/18 I received a call today from [place] Resident with [facility] at [place] on [address]. Patient has been started on [place] 1.8mg  once daily, & Novofine tips. Chrys Racer has originally faxed that application to [employer], patient was approved and medication was received that Dr. Bary Leriche office 01/29/18. Patient was upset that she had to come there to get meds, when she gets meds here at our office. I requested Chrys Racer to fax information to me and I would take over ordering. Explained that I would need to bring a form by for Dr. Nicki Reaper to sign. I have discussed with Keri, I have printed application & letter, will take to their office next week.Delos Haring

## 2018-02-13 NOTE — Progress Notes (Signed)
Requested Medication management to take over patient assistance for Victoza at patient's request [id number]/74/[id number] Faxed application materials to [place] at [facility]. Anne Ng states that she will fill out new paperwork and send in to [employer] appropriately.   Carlean Jews, Pharm.D., BCPS PGY2 Ambulatory Care Pharmacy Resident Phone: [phone]

## 2018-02-16 ENCOUNTER — Other Ambulatory Visit: Payer: Self-pay | Admitting: Internal Medicine

## 2018-02-18 ENCOUNTER — Telehealth: Payer: Self-pay | Admitting: Internal Medicine

## 2018-02-18 NOTE — Telephone Encounter (Signed)
Copied from [place] [phone]. Topic: Quick Communication - Rx Refill/Question >> Feb 18, 2018  2:24 PM [name] wrote: Medication:   propranolol (INDERAL) 20 MG tablet  Has the patient contacted their pharmacy? Yes.   Pharmacy did not get the new prescription.  (Agent: If no, request that the patient contact the pharmacy for the refill.) Preferred Pharmacy (with phone number or street name):   [facility] [place], [address] [place] [address] Phone: [phone] Fax: [phone]  Agent: Please be advised that RX refills may take up to 3 business days. We ask that you follow-up with your pharmacy.

## 2018-02-19 ENCOUNTER — Other Ambulatory Visit: Payer: Self-pay

## 2018-02-19 MED ORDER — PROPRANOLOL HCL 20 MG PO TABS: ORAL_TABLET | ORAL | 1 refills | Status: DC

## 2018-02-19 NOTE — Telephone Encounter (Signed)
rx sent in. Confirmed pt is taking regularly and changed directions on new rx. Patient takes 1 tablet TID.

## 2018-02-19 NOTE — Telephone Encounter (Unsigned)
Copied from [place] [phone]. Topic: Quick Communication - Rx Refill/Question >> Feb 19, 2018  9:40 AM Percell Belt A wrote: Pt called in again about the refill on this med.  She said she is about out

## 2018-02-20 ENCOUNTER — Telehealth: Payer: Self-pay | Admitting: Internal Medicine

## 2018-02-20 NOTE — Telephone Encounter (Signed)
Medication management dropped off a form to be filled out.Marland Kitchen Please contact Kathryn Friedman @ [phone] when completed for her to pick up.. Placed in Dr.Scotts colored folder upfront

## 2018-02-23 ENCOUNTER — Other Ambulatory Visit: Payer: Self-pay | Admitting: Internal Medicine

## 2018-02-24 ENCOUNTER — Telehealth: Payer: Self-pay | Admitting: Internal Medicine

## 2018-02-24 NOTE — Telephone Encounter (Signed)
Attempted to contact pt regarding medication; left message on voice mail [phone] for pt to call office; also see telephone encounter dated 02/19/18 from Garnette Scheuermann, LPN, " Confirmed pt is taking regularly and changed directions on new rx. Patient takes 1 tablet TID".

## 2018-02-24 NOTE — Telephone Encounter (Signed)
Copied from [place] [id number]. Topic: Quick Communication - Rx Refill/Question >> Feb 24, 2018 10:43 AM Oliver Pila B wrote: Medication: propranolol (INDERAL) 20 MG tablet [[id number]]  Pt has a concern over the dosage stating that she use to take 40mg , now the medication is 20mg , pt states she's been taking 2 pills at once. Wants to know why the medication dosage was decreased Call pt to advise

## 2018-02-25 ENCOUNTER — Other Ambulatory Visit: Payer: Self-pay | Admitting: Internal Medicine

## 2018-02-25 NOTE — Telephone Encounter (Signed)
Placed up front for pickup ° °

## 2018-02-25 NOTE — Telephone Encounter (Signed)
Signed and placed in box.   

## 2018-02-25 NOTE — Telephone Encounter (Signed)
Placed in your blue folder and labeled for signature

## 2018-02-26 ENCOUNTER — Other Ambulatory Visit: Payer: Self-pay

## 2018-02-26 MED ORDER — PROPRANOLOL HCL 40 MG PO TABS: 40.0000 mg | ORAL_TABLET | Freq: Two times a day (BID) | ORAL | 0 refills | Status: DC

## 2018-02-26 NOTE — Telephone Encounter (Signed)
Patient returned call about the propranolol. She says "I take 40 mg twice a day and I have been taking this for a long time. When did it change? I received 20 mg tablets from my pharmacy." I advised that according to her records, the 20 mg tablet is listed as what she takes. I looked through the history and found on 12/15/17 OV the medication was changed. She says "she wrote it down wrong. She asked how many I took and I told her 40 mg 2 times a day. I have never taken 20 mg tablets."  I advised this would be sent to Dr. Nicki Reaper for a new prescription of Propranolol 40 mg tablets BID, she verbalized understanding and says "I will just double up on the 20 mg since I have it and I will wait on the 40 mg to come in."    Propranolol 40 mg tablets BID (needs a new prescription) PCP: [place]: [facility] [place], [place] [address] [phone] (Phone) [phone] (Fax)

## 2018-02-26 NOTE — Telephone Encounter (Signed)
rx sent in. Pt aware 

## 2018-02-27 ENCOUNTER — Telehealth: Payer: Self-pay | Admitting: Pharmacist

## 2018-02-27 NOTE — Telephone Encounter (Signed)
02/27/2018 3:25:33 PM - Victoza & Novofine 32G tips  02/27/18 Faxed [employer] application for [employer] 1.8g once daily & Novofine 32G tips.Kathryn Friedman

## 2018-03-09 ENCOUNTER — Other Ambulatory Visit: Payer: Self-pay | Admitting: Internal Medicine

## 2018-03-10 ENCOUNTER — Other Ambulatory Visit: Payer: Self-pay | Admitting: Internal Medicine

## 2018-03-10 MED ORDER — CLOPIDOGREL BISULFATE 75 MG PO TABS: 75.0000 mg | ORAL_TABLET | Freq: Every day | ORAL | 0 refills | Status: DC

## 2018-04-06 ENCOUNTER — Encounter: Payer: Disability Insurance | Admitting: Pharmacist

## 2018-04-06 ENCOUNTER — Other Ambulatory Visit: Payer: Self-pay | Admitting: Internal Medicine

## 2018-04-06 ENCOUNTER — Telehealth: Payer: Self-pay | Admitting: Pharmacist

## 2018-04-06 NOTE — Telephone Encounter (Signed)
Patient called this morning and left a message to let me know she would not be able to make her 6 month MTM review.  She states that she has diarrhea and could not make it in to the clinic. Patient will be rescheduled when she comes to pick up her refills.  Kathryn Friedman K. Dicky Doe, PharmD [facility] [place] Operations Coordinator [phone]

## 2018-04-09 ENCOUNTER — Telehealth: Payer: Self-pay | Admitting: Internal Medicine

## 2018-04-09 NOTE — Telephone Encounter (Signed)
Pt called to check on status. This was requested 5/13 and again today by the pharmacy. Pt has 1 application for tonight. Please send in.   [facility] [place], [place] [address] [phone] (Phone) [phone] (Fax)

## 2018-04-09 NOTE — Telephone Encounter (Signed)
Patient called and says this is the 2nd request and she's checking on status. She has 1 application for tonight.  Nystatin cream refill Last OV:10/31/17 Last refill:11/24/17  [facility]: [facility] [place], [place] [address] [phone] (Phone) [phone] (Fax)

## 2018-04-09 NOTE — Telephone Encounter (Signed)
Left message to see if patient needs this refilled.

## 2018-04-09 NOTE — Telephone Encounter (Signed)
Please advise 

## 2018-04-10 ENCOUNTER — Other Ambulatory Visit: Payer: Self-pay

## 2018-04-10 MED ORDER — NYSTATIN 100000 UNIT/GM EX POWD
CUTANEOUS | 0 refills | Status: DC
Start: 2018-04-10 — End: 2018-05-21

## 2018-04-10 NOTE — Telephone Encounter (Signed)
Patient calling back and states this should be the powered not the cream. Please advise. Call back [phone]

## 2018-04-10 NOTE — Telephone Encounter (Signed)
rx for nystatin powder has been sent to pharmacy.

## 2018-04-14 ENCOUNTER — Ambulatory Visit
Admission: RE | Admit: 2018-04-14 | Discharge: 2018-04-14 | Disposition: A | Discharge status: Home / Self Care | Payer: Self-pay | Source: Ambulatory Visit | Attending: Oncology | Admitting: Oncology

## 2018-04-14 DIAGNOSIS — R920 Mammographic microcalcification found on diagnostic imaging of breast: Secondary | ICD-10-CM | POA: Insufficient documentation

## 2018-04-16 ENCOUNTER — Telehealth: Payer: Self-pay | Admitting: Internal Medicine

## 2018-04-16 NOTE — Telephone Encounter (Signed)
Annette from [facility] dropped off papers to be completed by Dr. Nicki Reaper. Forms are up front in Dr. Bary Leriche color folder. Please call highlighted phone when ready for pick up.

## 2018-04-17 ENCOUNTER — Ambulatory Visit (INDEPENDENT_AMBULATORY_CARE_PROVIDER_SITE_OTHER): Payer: Self-pay | Admitting: Internal Medicine

## 2018-04-17 ENCOUNTER — Encounter: Payer: Self-pay | Admitting: Internal Medicine

## 2018-04-17 VITALS — BP 128/70 | HR 90 | Temp 99.2°F | Resp 18 | Wt 241.8 lb

## 2018-04-17 DIAGNOSIS — F32A Depression, unspecified: Secondary | ICD-10-CM

## 2018-04-17 DIAGNOSIS — J449 Chronic obstructive pulmonary disease, unspecified: Secondary | ICD-10-CM

## 2018-04-17 DIAGNOSIS — I2510 Atherosclerotic heart disease of native coronary artery without angina pectoris: Secondary | ICD-10-CM

## 2018-04-17 DIAGNOSIS — F329 Major depressive disorder, single episode, unspecified: Secondary | ICD-10-CM

## 2018-04-17 DIAGNOSIS — J849 Interstitial pulmonary disease, unspecified: Secondary | ICD-10-CM

## 2018-04-17 DIAGNOSIS — E7800 Pure hypercholesterolemia, unspecified: Secondary | ICD-10-CM

## 2018-04-17 DIAGNOSIS — I10 Essential (primary) hypertension: Secondary | ICD-10-CM

## 2018-04-17 DIAGNOSIS — E119 Type 2 diabetes mellitus without complications: Secondary | ICD-10-CM

## 2018-04-17 DIAGNOSIS — K219 Gastro-esophageal reflux disease without esophagitis: Secondary | ICD-10-CM

## 2018-04-17 DIAGNOSIS — G4733 Obstructive sleep apnea (adult) (pediatric): Secondary | ICD-10-CM

## 2018-04-17 DIAGNOSIS — D649 Anemia, unspecified: Secondary | ICD-10-CM

## 2018-04-17 LAB — BASIC METABOLIC PANEL
BUN: 4 mg/dL — AB (ref 6–23)
CHLORIDE: 101 meq/L (ref 96–112)
CO2: 26 mEq/L (ref 19–32)
Calcium: 9.6 mg/dL (ref 8.4–10.5)
Creatinine, Ser: 0.66 mg/dL (ref 0.40–1.20)
GFR: 96.42 mL/min (ref >60.00)
GLUCOSE: 135 mg/dL — AB (ref 70–99)
POTASSIUM: 3.9 meq/L (ref 3.5–5.1)
SODIUM: 136 meq/L (ref 135–145)

## 2018-04-17 LAB — CBC WITH DIFFERENTIAL/PLATELET
BASOS ABS: 0 10*3/uL (ref 0.0–0.1)
Basophils Relative: 0.3 % (ref 0.0–3.0)
EOS ABS: 0.2 10*3/uL (ref 0.0–0.7)
Eosinophils Relative: 2 % (ref 0.0–5.0)
HCT: 39.8 % (ref 36.0–46.0)
Hemoglobin: 12.8 g/dL (ref 12.0–15.0)
LYMPHS ABS: 2.4 10*3/uL (ref 0.7–4.0)
Lymphocytes Relative: 23.8 % (ref 12.0–46.0)
MCHC: 32.2 g/dL (ref 30.0–36.0)
MCV: 79.4 fl (ref 78.0–100.0)
MONO ABS: 0.6 10*3/uL (ref 0.1–1.0)
Monocytes Relative: 5.7 % (ref 3.0–12.0)
NEUTROS ABS: 6.9 10*3/uL (ref 1.4–7.7)
NEUTROS PCT: 68.2 % (ref 43.0–77.0)
PLATELETS: 247 10*3/uL (ref 150.0–400.0)
RBC: 5.01 Mil/uL (ref 3.87–5.11)
RDW: 23.9 % — AB (ref 11.5–15.5)
WBC: 10.1 10*3/uL (ref 4.0–10.5)

## 2018-04-17 LAB — LIPID PANEL
CHOLESTEROL: 99 mg/dL (ref 0–200)
HDL: 41.1 mg/dL (ref >39.00)
LDL Cholesterol: 37 mg/dL (ref 0–99)
NonHDL: 58.23
TRIGLYCERIDES: 108 mg/dL (ref 0.0–149.0)
Total CHOL/HDL Ratio: 2
VLDL: 21.6 mg/dL (ref 0.0–40.0)

## 2018-04-17 LAB — FERRITIN: FERRITIN: 10.8 ng/mL (ref 10.0–291.0)

## 2018-04-17 LAB — HEPATIC FUNCTION PANEL
ALBUMIN: 3.8 g/dL (ref 3.5–5.2)
ALK PHOS: 66 U/L (ref 39–117)
ALT: 17 U/L (ref 0–35)
AST: 22 U/L (ref 0–37)
Bilirubin, Direct: 0.1 mg/dL (ref 0.0–0.3)
Total Bilirubin: 0.3 mg/dL (ref 0.2–1.2)
Total Protein: 7 g/dL (ref 6.0–8.3)

## 2018-04-17 LAB — HEMOGLOBIN A1C: HEMOGLOBIN A1C: 6.1 % (ref 4.6–6.5)

## 2018-04-17 LAB — HM DIABETES FOOT EXAM

## 2018-04-17 NOTE — Telephone Encounter (Signed)
Signed and placed in box.   

## 2018-04-17 NOTE — Telephone Encounter (Signed)
Placed in your quick sign folder for signature

## 2018-04-17 NOTE — Telephone Encounter (Signed)
Called Anne Ng to let her know papers have been signed. Left message telling her that paperwork was placed up front for pick up and could call back if needed.

## 2018-04-17 NOTE — Progress Notes (Signed)
Patient ID: Kathryn Friedman, female   DOB: Oct 29, 1956, 62 y.o.   MRN: [id number]   Subjective:    Patient ID: Kathryn Friedman, female    DOB: 28-Apr-1956, 62 y.o.   MRN: [id number]  HPI  Patient here for a scheduled follow up. Recently evaluated by cardiology.  Planning for stress test.  Overall she feels things are stable.  No increased cough or congestion.  No chest pain.  No acid reflux.  No abdominal pain.  Bowels moving.  Was carrying groceries three weeks ago.  Tripped. Fell and hit right anterior ribs.  No head injury or other injury.  Took tylenol.  Better.  No pain with deep breathing.  States am sugars averaging 125-130.  Trying to watch her diet.  Discussed diet and exercise.     Past Medical History:  Diagnosis Date  . Anemia   . Arthritis    back and knees  . Asthma   . Chronic diarrhea   . COPD (chronic obstructive pulmonary disease) ([place])   . Coronary artery disease    s/p stent placement 06/25/06  . Depression    secondary to the death of her husband (died 5)  . Diabetes mellitus without complication ([place]) Diagnosed 05/2008  . Diverticulitis   . Fatty infiltration of liver   . GERD (gastroesophageal reflux disease)   . Hypertension   . Hypertriglyceridemia   . Sleep apnea    on CPAP  . Spastic colon   . Tobacco abuse    Past Surgical History:  Procedure Laterality Date  . ABDOMINAL HYSTERECTOMY  with left ovary in place 1996  . APPENDECTOMY  1985  . BREAST BIOPSY Left 10/14/2017   benign  . CESAREAN SECTION  1984  . CHOLECYSTECTOMY  1985  . COLONOSCOPY WITH PROPOFOL N/A 09/13/2016   Procedure: COLONOSCOPY WITH PROPOFOL;  Surgeon: Manya Silvas, MD;  Location: [facility] ENDOSCOPY;  Service: Endoscopy;  Laterality: N/A;  . ESOPHAGOGASTRODUODENOSCOPY (EGD) WITH PROPOFOL N/A 02/02/2018   Procedure: ESOPHAGOGASTRODUODENOSCOPY (EGD) WITH PROPOFOL;  Surgeon: Manya Silvas, MD;  Location[facility] ENDOSCOPY;  Service: Endoscopy;  Laterality: N/A;  .  JOINT REPLACEMENT     bilateral knee replacements  . KNEE ARTHROSCOPY  Arthroscopic left knee surgery   . KNEE SURGERY  status post knee surgey   . REPLACEMENT TOTAL KNEE  ([facility])  . SHOULDER SURGERY  shoulder operation secondary to a torn tendon   Family History  Problem Relation Age of Onset  . Other Mother        Hit by a fire truck and has had multiple operations on her back , and has history of MVP   . Mitral valve prolapse Mother   . Breast cancer Mother   . Heart disease Father        myocardial infarction and is status post bypass surgery  . Mitral valve prolapse Sister   . Hepatitis C Brother   . Cirrhosis Brother   . Colon cancer Paternal Aunt    Social History   Socioeconomic History  . Marital status: Married    Spouse name: Not on file  . Number of children: 1  . Years of education: Not on file  . Highest education level: Not on file  Occupational History    Employer: [employer]  Social Needs  . Financial resource strain: Not on file  . Food insecurity:    Worry: Not on file    Inability: Not on file  . Transportation needs:  Medical: Not on file    Non-medical: Not on file  Tobacco Use  . Smoking status: Current Every Day Smoker    Packs/day: 2.00    Years: 45.00    Pack years: 90.00    Types: Cigarettes  . Smokeless tobacco: Never Used  Substance and Sexual Activity  . Alcohol use: No    Alcohol/week: 0.0 oz  . Drug use: No  . Sexual activity: Not on file  Lifestyle  . Physical activity:    Days per week: Not on file    Minutes per session: Not on file  . Stress: Not on file  Relationships  . Social connections:    Talks on phone: Not on file    Gets together: Not on file    Attends religious service: Not on file    Active member of club or organization: Not on file    Attends meetings of clubs or organizations: Not on file    Relationship status: Not on file  Other Topics Concern  . Not on file  Social History Narrative  . Not on file     Outpatient Encounter Medications as of 04/17/2018  Medication Sig  . albuterol (VENTOLIN HFA) 108 (90 BASE) MCG/ACT inhaler Inhale 2 puffs into the lungs every 4 (four) hours as needed. (Patient taking differently: Inhale 2 puffs into the lungs every 4 (four) hours as needed. 2 puffs twice daily)  . amLODipine (NORVASC) 10 MG tablet Take 1 tablet (10 mg total) by mouth daily.  Marland Kitchen aspirin 81 MG tablet Take 81 mg by mouth daily. Take 1 tablet by mouth once a day as directed  . atorvastatin (LIPITOR) 40 MG tablet TAKE ONE TABLET BY MOUTH EVERY EVENING  . Brexpiprazole (REXULTI) 2 MG TABS Take 2 mg by mouth daily.  . Calcium Carb-Cholecalciferol (CALCIUM-VITAMIN D) 500-200 MG-UNIT tablet Take 1 tablet by mouth daily.  . citalopram (CELEXA) 40 MG tablet Take 40 mg at bedtime by mouth.  . clopidogrel (PLAVIX) 75 MG tablet Take 1 tablet (75 mg total) by mouth daily.  . colestipol (COLESTID) 1 g tablet Take 2 g by mouth daily.  . cyclobenzaprine (FLEXERIL) 10 MG tablet TAKE ONE TABLET BY MOUTH 3 TIMES A DAY AS NEEDED FOR MUSCLE SPASMS  . DULoxetine (CYMBALTA) 60 MG capsule Take 60 mg by mouth daily.  . Fluticasone-Salmeterol (ADVAIR) 250-50 MCG/DOSE AEPB Inhale 1 puff into the lungs every 12 (twelve) hours.  Marland Kitchen liraglutide (VICTOZA) 18 MG/3ML SOPN Inject 0.6 mg subcutaneously daily for 1 week, then increase to 1.2 mg daily for 1 week, then increase to 1.8 mg daily thereafter. (Patient taking differently: Inject 1.8 mg into the skin daily. )  . metFORMIN (GLUCOPHAGE-XR) 500 MG 24 hr tablet TAKE TWO TABLETS (1000MG) BY MOUTH 2 TIMES A DAY  . nystatin (NYSTATIN) powder Apply topically 2 (two) times daily.  Marland Kitchen nystatin cream (MYCOSTATIN) APPLY ONE APPLICATION TOPICALLY 2 TIMES A DAY  . pantoprazole (PROTONIX) 40 MG tablet TAKE ONE TABLET BY MOUTH EVERY DAY (Patient taking differently: TAKE TWO TABLETS BY MOUTH EVERY DAY)  . propranolol (INDERAL) 40 MG tablet Take 1 tablet (40 mg total) by mouth 2 (two)  times daily.  . sucralfate (CARAFATE) 1 g tablet Take 1 g by mouth 3 (three) times daily with meals.  . traZODone (DESYREL) 100 MG tablet Take 200 mg by mouth at bedtime.    No facility-administered encounter medications on file as of 04/17/2018.     Review of Systems  Constitutional: Negative  for appetite change and unexpected weight change.  HENT: Negative for congestion and sinus pressure.   Respiratory: Negative for cough and chest tightness.        Breathing overall stable.  Being w/up by cardiology for some sob with exertion.  Note reviewed.    Cardiovascular: Negative for chest pain, palpitations and leg swelling.  Gastrointestinal: Negative for abdominal pain, diarrhea, nausea and vomiting.  Genitourinary: Negative for difficulty urinating and dysuria.  Musculoskeletal: Negative for joint swelling and myalgias.  Skin: Negative for color change and rash.  Neurological: Negative for dizziness, light-headedness and headaches.  Psychiatric/Behavioral: Negative for agitation and dysphoric mood.       Objective:    Physical Exam  Constitutional: She appears well-developed and well-nourished. No distress.  HENT:  Nose: Nose normal.  Mouth/Throat: Oropharynx is clear and moist.  Neck: Neck supple. No thyromegaly present.  Cardiovascular: Normal rate and regular rhythm.  Pulmonary/Chest: Breath sounds normal. No respiratory distress. She has no wheezes.  Abdominal: Soft. Bowel sounds are normal. There is no tenderness.  Musculoskeletal: She exhibits no edema or tenderness.  Feet:  No lesions.  Intact to light touch and pin prick.  DP pulses palpable and equal bilaterally.    Lymphadenopathy:    She has no cervical adenopathy.  Skin: No rash noted. No erythema.  Psychiatric: She has a normal mood and affect. Her behavior is normal.    BP 128/70 (BP Location: Left Arm, Patient Position: Sitting, Cuff Size: Large)   Pulse 90   Temp 99.2 F (37.3 C) (Oral)   Resp 18   Wt 241  lb 12.8 oz (109.7 kg)   SpO2 93%   BMI 41.50 kg/m  Wt Readings from Last 3 Encounters:  04/17/18 241 lb 12.8 oz (109.7 kg)  02/09/18 251 lb (113.9 kg)  02/02/18 250 lb (113.4 kg)     Lab Results  Component Value Date   WBC 10.1 04/17/2018   HGB 12.8 04/17/2018   HCT 39.8 04/17/2018   PLT 247.0 04/17/2018   GLUCOSE 135 (H) 04/17/2018   CHOL 99 04/17/2018   TRIG 108.0 04/17/2018   HDL 41.10 04/17/2018   LDLCALC 37 04/17/2018   ALT 17 04/17/2018   AST 22 04/17/2018   NA 136 04/17/2018   K 3.9 04/17/2018   CL 101 04/17/2018   CREATININE 0.66 04/17/2018   BUN 4 (L) 04/17/2018   CO2 26 04/17/2018   TSH 1.42 12/02/2017   HGBA1C 6.1 04/17/2018   MICROALBUR <0.7 08/14/2017    Ms Digital Diag Tomo Uni Left  Result Date: 04/14/2018 CLINICAL DATA:  Follow-up of probably benign left breast upper outer quadrant calcifications. History of benign stereotactic guided core needle biopsy of a separate group of left breast upper outer quadrant calcifications. EXAM: DIGITAL DIAGNOSTIC UNILATERAL LEFT MAMMOGRAM WITH CAD AND TOMO COMPARISON:  Previous exam(s). ACR Breast Density Category b: There are scattered areas of fibroglandular density. FINDINGS: Standard mammographic views and compression magnification views of the left breast demonstrate stable benign-appearing group of calcifications in the left breast upper outer quadrant, posterior depth. Post biopsy changes with ribbon shaped marker are seen in the adjacent breast parenchyma, from stereotactic core needle biopsy of a separate group of calcifications with benign results. Mammographic images were processed with CAD. IMPRESSION: Stable benign left breast upper outer quadrant calcifications. Expected post biopsy changes from stereotactic core needle biopsy of a separate group of left breast upper outer quadrant calcifications with benign results. RECOMMENDATION: Bilateral diagnostic mammogram 6 months. I  have discussed the findings and  recommendations with the patient. Results were also provided in writing at the conclusion of the visit. If applicable, a reminder letter will be sent to the patient regarding the next appointment. BI-RADS CATEGORY  3: Probably benign. Electronically Signed   By: Fidela Salisbury M.D.   On: 04/14/2018 13:22       Assessment & Plan:   Problem List Items Addressed This Visit    Anemia - Primary    Follow cbc.       Relevant Orders   CBC with Differential/Platelet (Completed)   Ferritin (Completed)   CAD (coronary artery disease)    S/p stent placement.  Recently evaluated by cardiology.  Note reviewed. Planning for stress test.  Continue risk factor modification.      COPD (chronic obstructive pulmonary disease) (H[facility])    Breathing overall stable.  Follow.  Continue inhalers.        Depression    Seeing psychiatry.  Discussed the need for continued f/u with psychiatry given current medication regimen.  Appears to be doing well on current regimen.        Diabetes ([place])    Low carb diet and exercise.  Follow met b and a1c.  Sugars as outlined.  Keep up to date with eye checks.        Relevant Orders   Hemoglobin A1c (Completed)   Basic metabolic panel (Completed)   Essential hypertension, benign    Blood pressure under good control.  Continue same medication regimen.  Follow pressures.  Follow metabolic panel.        GERD (gastroesophageal reflux disease)    Controlled on current regimen.        Hypercholesterolemia    On lipitor.  Low cholesterol diet and exercise.  Follow lipid panel and liver function tests.        Relevant Orders   Hepatic function panel (Completed)   Lipid panel (Completed)   ILD (interstitial lung disease) (HCC)    Breathing stable.        Obstructive sleep apnea    Continue cpap.            Einar Pheasant, MD

## 2018-04-20 ENCOUNTER — Encounter: Payer: Self-pay | Admitting: Internal Medicine

## 2018-04-20 NOTE — Assessment & Plan Note (Signed)
Breathing stable.

## 2018-04-20 NOTE — Assessment & Plan Note (Signed)
Breathing overall stable.  Follow.  Continue inhalers.

## 2018-04-20 NOTE — Assessment & Plan Note (Signed)
Blood pressure under good control.  Continue same medication regimen.  Follow pressures.  Follow metabolic panel.   

## 2018-04-20 NOTE — Assessment & Plan Note (Signed)
Follow cbc.  

## 2018-04-20 NOTE — Assessment & Plan Note (Signed)
Seeing psychiatry.  Discussed the need for continued f/u with psychiatry given current medication regimen.  Appears to be doing well on current regimen.

## 2018-04-20 NOTE — Assessment & Plan Note (Signed)
Controlled on current regimen.   

## 2018-04-20 NOTE — Assessment & Plan Note (Signed)
On lipitor.  Low cholesterol diet and exercise.  Follow lipid panel and liver function tests.   

## 2018-04-20 NOTE — Assessment & Plan Note (Signed)
Continue cpap.  

## 2018-04-20 NOTE — Assessment & Plan Note (Signed)
S/p stent placement.  Recently evaluated by cardiology.  Note reviewed. Planning for stress test.  Continue risk factor modification.

## 2018-04-20 NOTE — Assessment & Plan Note (Signed)
Low carb diet and exercise.  Follow met b and a1c.  Sugars as outlined.  Keep up to date with eye checks.

## 2018-04-21 ENCOUNTER — Encounter: Payer: Self-pay | Admitting: Internal Medicine

## 2018-04-21 ENCOUNTER — Ambulatory Visit (INDEPENDENT_AMBULATORY_CARE_PROVIDER_SITE_OTHER): Payer: Self-pay | Admitting: Internal Medicine

## 2018-04-21 VITALS — BP 128/78 | HR 86 | Temp 98.7°F | Resp 18 | Wt 238.4 lb

## 2018-04-21 DIAGNOSIS — W5501XA Bitten by cat, initial encounter: Secondary | ICD-10-CM

## 2018-04-21 DIAGNOSIS — Z23 Encounter for immunization: Secondary | ICD-10-CM

## 2018-04-21 DIAGNOSIS — S60471A Other superficial bite of left index finger, initial encounter: Secondary | ICD-10-CM

## 2018-04-21 MED ORDER — MUPIROCIN 2 % EX OINT: TOPICAL_OINTMENT | CUTANEOUS | 0 refills | Status: DC

## 2018-04-21 MED ORDER — AMOXICILLIN-POT CLAVULANATE 875-125 MG PO TABS: 1.0000 | ORAL_TABLET | Freq: Two times a day (BID) | ORAL | 0 refills | Status: DC

## 2018-04-21 NOTE — Telephone Encounter (Signed)
Pt scheduled for 12:30 today

## 2018-04-21 NOTE — Patient Instructions (Signed)
Take a probiotic while you are on the antibiotics and for two weeks after completing the antibiotics.    Examples of probiotics:  Align, florastor or culturelle
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
## 2018-04-21 NOTE — Telephone Encounter (Signed)
Needs to be seen.  Can she come in at 12:30 or at 4:30 - work in for this.

## 2018-04-21 NOTE — Progress Notes (Signed)
Called animal control and reported bite to indx finger and also called patient with some help with numbers to call to receive help with animal care and shots. CARE [phone] [employer] [phone], [employer] [facility] [phone].

## 2018-04-23 ENCOUNTER — Telehealth: Payer: Self-pay

## 2018-04-23 NOTE — Telephone Encounter (Signed)
Copied from [place] [id number]. Topic: General - Other >> Apr 23, 2018  8:27 AM Carolyn Stare wrote:  Pt call to say the and swelling and redness has gone down a bunch  they did not take your cat

## 2018-04-25 ENCOUNTER — Encounter: Payer: Self-pay | Admitting: Internal Medicine

## 2018-04-27 ENCOUNTER — Telehealth: Payer: Self-pay | Admitting: Internal Medicine

## 2018-04-27 ENCOUNTER — Other Ambulatory Visit: Payer: Self-pay | Admitting: Internal Medicine

## 2018-04-27 NOTE — Telephone Encounter (Signed)
Copied from [place] [id number]. Topic: Quick Communication - See Telephone Encounter >> Apr 27, 2018  9:59 AM Vernona Rieger wrote: CRM for notification. See Telephone encounter for: 04/27/18.  Patient said she needs to know what she needs to be taking milligram wise for her medication propranolol (INDERAL) 40 MG tablet  She said she usually takes 20 milligrams three times a day ( per medication mgt ). She said she is almost out. She was taking 2 pills 20 mgs twice a day. Please advise. She said she needs clarification on this  [facility] [place], [place] [address]

## 2018-04-27 NOTE — Telephone Encounter (Signed)
It looks like she is prescribed her to take 40mg  two times daily. She is taking differently. Please advise

## 2018-04-28 ENCOUNTER — Other Ambulatory Visit: Payer: Self-pay | Admitting: Internal Medicine

## 2018-04-29 NOTE — Telephone Encounter (Signed)
Attempted to call medication management. No answer, will try again

## 2018-04-29 NOTE — Telephone Encounter (Signed)
rx ok'd for flexeril #90 with one refill.  Sent to [facility].  Pharmacy marked.

## 2018-04-29 NOTE — Telephone Encounter (Signed)
Last OV 04/21/18 Next OV none Last refill 03/10/18

## 2018-04-30 ENCOUNTER — Telehealth: Payer: Self-pay

## 2018-04-30 MED ORDER — PROPRANOLOL HCL 40 MG PO TABS: 40.0000 mg | ORAL_TABLET | Freq: Two times a day (BID) | ORAL | 0 refills | Status: DC

## 2018-04-30 NOTE — Telephone Encounter (Signed)
Copied from [place] [id number]. Topic: General - Other >> Apr 30, 2018  2:45 PM Carolyn Stare wrote:  Pt would like a call back about why her medicine was denied  [phone]     cyclobenzaprine (FLEXERIL) 10 MG tablet

## 2018-05-01 NOTE — Telephone Encounter (Signed)
Let pt know that I denied flexeril because it is was a duplicate.

## 2018-05-01 NOTE — Telephone Encounter (Signed)
Clarified with pt 40 mg BID

## 2018-05-04 ENCOUNTER — Other Ambulatory Visit: Payer: Self-pay | Admitting: Internal Medicine

## 2018-05-14 ENCOUNTER — Encounter
Admission: RE | Disposition: A | Discharge status: Home / Self Care | Payer: Self-pay | Source: Ambulatory Visit | Attending: Internal Medicine

## 2018-05-14 ENCOUNTER — Ambulatory Visit
Admission: RE | Admit: 2018-05-14 | Discharge: 2018-05-14 | Disposition: A | Discharge status: Home / Self Care | Payer: Self-pay | Source: Ambulatory Visit | Attending: Internal Medicine | Admitting: Internal Medicine

## 2018-05-14 DIAGNOSIS — Z79899 Other long term (current) drug therapy: Secondary | ICD-10-CM | POA: Insufficient documentation

## 2018-05-14 DIAGNOSIS — J449 Chronic obstructive pulmonary disease, unspecified: Secondary | ICD-10-CM | POA: Insufficient documentation

## 2018-05-14 DIAGNOSIS — M7989 Other specified soft tissue disorders: Secondary | ICD-10-CM | POA: Insufficient documentation

## 2018-05-14 DIAGNOSIS — Z7951 Long term (current) use of inhaled steroids: Secondary | ICD-10-CM | POA: Insufficient documentation

## 2018-05-14 DIAGNOSIS — I25119 Atherosclerotic heart disease of native coronary artery with unspecified angina pectoris: Secondary | ICD-10-CM | POA: Insufficient documentation

## 2018-05-14 DIAGNOSIS — G4730 Sleep apnea, unspecified: Secondary | ICD-10-CM | POA: Insufficient documentation

## 2018-05-14 DIAGNOSIS — R079 Chest pain, unspecified: Secondary | ICD-10-CM

## 2018-05-14 DIAGNOSIS — Z9889 Other specified postprocedural states: Secondary | ICD-10-CM | POA: Insufficient documentation

## 2018-05-14 DIAGNOSIS — F17210 Nicotine dependence, cigarettes, uncomplicated: Secondary | ICD-10-CM | POA: Insufficient documentation

## 2018-05-14 DIAGNOSIS — E785 Hyperlipidemia, unspecified: Secondary | ICD-10-CM | POA: Insufficient documentation

## 2018-05-14 DIAGNOSIS — Z888 Allergy status to other drugs, medicaments and biological substances status: Secondary | ICD-10-CM | POA: Insufficient documentation

## 2018-05-14 DIAGNOSIS — K219 Gastro-esophageal reflux disease without esophagitis: Secondary | ICD-10-CM | POA: Insufficient documentation

## 2018-05-14 DIAGNOSIS — I10 Essential (primary) hypertension: Secondary | ICD-10-CM | POA: Insufficient documentation

## 2018-05-14 DIAGNOSIS — K760 Fatty (change of) liver, not elsewhere classified: Secondary | ICD-10-CM | POA: Insufficient documentation

## 2018-05-14 DIAGNOSIS — Z7982 Long term (current) use of aspirin: Secondary | ICD-10-CM | POA: Insufficient documentation

## 2018-05-14 DIAGNOSIS — R0602 Shortness of breath: Secondary | ICD-10-CM | POA: Insufficient documentation

## 2018-05-14 DIAGNOSIS — Z96653 Presence of artificial knee joint, bilateral: Secondary | ICD-10-CM | POA: Insufficient documentation

## 2018-05-14 DIAGNOSIS — Z7901 Long term (current) use of anticoagulants: Secondary | ICD-10-CM | POA: Insufficient documentation

## 2018-05-14 DIAGNOSIS — I2510 Atherosclerotic heart disease of native coronary artery without angina pectoris: Secondary | ICD-10-CM | POA: Diagnosis present

## 2018-05-14 DIAGNOSIS — Z90710 Acquired absence of both cervix and uterus: Secondary | ICD-10-CM | POA: Insufficient documentation

## 2018-05-14 DIAGNOSIS — Z7984 Long term (current) use of oral hypoglycemic drugs: Secondary | ICD-10-CM | POA: Insufficient documentation

## 2018-05-14 DIAGNOSIS — Z8249 Family history of ischemic heart disease and other diseases of the circulatory system: Secondary | ICD-10-CM | POA: Insufficient documentation

## 2018-05-14 HISTORY — PX: LEFT HEART CATH AND CORONARY ANGIOGRAPHY: CATH118249

## 2018-05-14 LAB — GLUCOSE, CAPILLARY: Glucose-Capillary: 161 mg/dL — ABNORMAL HIGH (ref 65–99)

## 2018-05-14 SURGERY — LEFT HEART CATH AND CORONARY ANGIOGRAPHY
Anesthesia: Moderate Sedation | Laterality: Left

## 2018-05-14 MED ORDER — LIDOCAINE HCL (PF) 1 % IJ SOLN
INTRAMUSCULAR | Status: AC
  Filled 2018-05-14: qty 30

## 2018-05-14 MED ORDER — SODIUM CHLORIDE 0.9 % IV SOLN: 250.0000 mL | INTRAVENOUS | Status: DC | PRN

## 2018-05-14 MED ORDER — IOPAMIDOL (ISOVUE-300) INJECTION 61%
INTRAVENOUS | Status: DC | PRN
  Administered 2018-05-14: 120 mL via INTRA_ARTERIAL

## 2018-05-14 MED ORDER — SODIUM CHLORIDE 0.9 % WEIGHT BASED INFUSION: 1.0000 mL/kg/h | INTRAVENOUS | Status: DC

## 2018-05-14 MED ORDER — SODIUM CHLORIDE 0.9% FLUSH: 3.0000 mL | INTRAVENOUS | Status: DC | PRN

## 2018-05-14 MED ORDER — MIDAZOLAM HCL 2 MG/2ML IJ SOLN
INTRAMUSCULAR | Status: AC
  Filled 2018-05-14: qty 2

## 2018-05-14 MED ORDER — FENTANYL CITRATE (PF) 100 MCG/2ML IJ SOLN
INTRAMUSCULAR | Status: DC | PRN
  Administered 2018-05-14: 25 ug via INTRAVENOUS

## 2018-05-14 MED ORDER — ONDANSETRON HCL 4 MG/2ML IJ SOLN: 4.0000 mg | Freq: Four times a day (QID) | INTRAMUSCULAR | Status: DC | PRN

## 2018-05-14 MED ORDER — ASPIRIN 81 MG PO CHEW: 81.0000 mg | CHEWABLE_TABLET | ORAL | Status: DC

## 2018-05-14 MED ORDER — ACETAMINOPHEN 325 MG PO TABS: 650.0000 mg | ORAL_TABLET | ORAL | Status: DC | PRN

## 2018-05-14 MED ORDER — MIDAZOLAM HCL 2 MG/2ML IJ SOLN
INTRAMUSCULAR | Status: DC | PRN
  Administered 2018-05-14: 1 mg via INTRAVENOUS

## 2018-05-14 MED ORDER — FENTANYL CITRATE (PF) 100 MCG/2ML IJ SOLN
INTRAMUSCULAR | Status: AC
  Filled 2018-05-14: qty 2

## 2018-05-14 MED ORDER — SODIUM CHLORIDE 0.9 % WEIGHT BASED INFUSION
3.0000 mL/kg/h | INTRAVENOUS | Status: AC
  Administered 2018-05-14: 3 mL/kg/h via INTRAVENOUS

## 2018-05-14 MED ORDER — HEPARIN (PORCINE) IN NACL 1000-0.9 UT/500ML-% IV SOLN
INTRAVENOUS | Status: AC
  Filled 2018-05-14: qty 1000

## 2018-05-14 MED ORDER — SODIUM CHLORIDE 0.9% FLUSH: 3.0000 mL | Freq: Two times a day (BID) | INTRAVENOUS | Status: DC

## 2018-05-14 SURGICAL SUPPLY — 10 items
CATH INFINITI 5FR ANG PIGTAIL (CATHETERS) ×3 IMPLANT
CATH INFINITI 5FR JL4 (CATHETERS) ×3 IMPLANT
CATH INFINITI JR4 5F (CATHETERS) ×3 IMPLANT
DEVICE CLOSURE MYNXGRIP 5F (Vascular Products) ×2 IMPLANT
KIT MANI 3VAL PERCEP (MISCELLANEOUS) ×3 IMPLANT
NDL PERC 18GX7CM (NEEDLE) IMPLANT
NEEDLE PERC 18GX7CM (NEEDLE) ×3 IMPLANT
PACK CARDIAC CATH (CUSTOM PROCEDURE TRAY) ×3 IMPLANT
SHEATH AVANTI 5FR X 11CM (SHEATH) ×2 IMPLANT
WIRE GUIDERIGHT .035X150 (WIRE) ×2 IMPLANT

## 2018-05-14 NOTE — Progress Notes (Signed)
labwork for patient is from 5/24 orders state outpatient labwork needs to be within 14days dr. Nehemiah Massed paged and states that there is no need to draw new labs.

## 2018-05-16 ENCOUNTER — Other Ambulatory Visit: Payer: Self-pay | Admitting: Internal Medicine

## 2018-05-19 ENCOUNTER — Telehealth: Payer: Self-pay | Admitting: Internal Medicine

## 2018-05-19 NOTE — Telephone Encounter (Signed)
Copied from [place] [phone]. Topic: Quick Communication - Rx Refill/Question >> May 19, 2018  4:04 PM Cecelia Byars, NT wrote: Medication: nystatin (NYSTATIN) powder  Has the patient contacted their pharmacy? {yes (Agent: If no, request that the patient contact the pharmacy for the refill. (Agent: If yes, when and what did the pharmacy advise?  Preferred Pharmacy (with phone number or street name): [facility] [place], [place] [address] [phone] (Phone) [phone] (Fax)  Patient says pharmacy says the request was for cream instead of powder    Agent: Please be advised that RX refills may take up to 3 business days. We ask that you follow-up with your pharmacy.

## 2018-05-20 NOTE — Telephone Encounter (Signed)
See message below is it ok to switch from powder to cream. Please advise

## 2018-05-21 ENCOUNTER — Encounter: Payer: Self-pay | Admitting: Internal Medicine

## 2018-05-21 ENCOUNTER — Other Ambulatory Visit: Payer: Self-pay

## 2018-05-21 MED ORDER — NYSTATIN 100000 UNIT/GM EX POWD: CUTANEOUS | 0 refills | Status: DC

## 2018-05-21 NOTE — Telephone Encounter (Signed)
Are you ok with switching from the nystatin powder to the cream?

## 2018-05-21 NOTE — Telephone Encounter (Signed)
Ok

## 2018-05-21 NOTE — Telephone Encounter (Signed)
rx for powder sent, see my chart msg

## 2018-06-01 ENCOUNTER — Other Ambulatory Visit: Payer: Self-pay | Admitting: Internal Medicine

## 2018-06-11 ENCOUNTER — Other Ambulatory Visit: Payer: Self-pay | Admitting: Internal Medicine

## 2018-06-12 ENCOUNTER — Other Ambulatory Visit: Payer: Self-pay

## 2018-06-12 DIAGNOSIS — R920 Mammographic microcalcification found on diagnostic imaging of breast: Secondary | ICD-10-CM

## 2018-06-18 ENCOUNTER — Telehealth: Payer: Self-pay | Admitting: Internal Medicine

## 2018-06-18 NOTE — Telephone Encounter (Signed)
Med. Management dropped off forms to be signed. Placed in Dr.Scott's colored folder upfront. Please call Elmer Picker at [phone] when completed

## 2018-06-18 NOTE — Telephone Encounter (Signed)
Signed and placed in box.   

## 2018-06-18 NOTE — Telephone Encounter (Signed)
Placed in your folder for signature 

## 2018-06-19 NOTE — Telephone Encounter (Signed)
Kathryn Friedman is aware papers are ready. I will place up front for pick up

## 2018-06-29 ENCOUNTER — Other Ambulatory Visit: Payer: Self-pay | Admitting: Internal Medicine

## 2018-07-13 ENCOUNTER — Other Ambulatory Visit: Payer: Self-pay | Admitting: Internal Medicine

## 2018-07-23 ENCOUNTER — Telehealth: Payer: Self-pay | Admitting: Internal Medicine

## 2018-07-23 NOTE — Telephone Encounter (Signed)
Form placed in box for signature °

## 2018-07-23 NOTE — Telephone Encounter (Signed)
Signed and placed in box.   

## 2018-07-23 NOTE — Telephone Encounter (Signed)
Med management dropped off form to be signed. Placed in Dr. Bary Leriche color folder upfront  Please contact Anne Ng at [phone] when completed

## 2018-07-24 NOTE — Telephone Encounter (Signed)
Form given to [facility]

## 2018-07-28 ENCOUNTER — Other Ambulatory Visit: Payer: Self-pay | Admitting: Internal Medicine

## 2018-08-03 ENCOUNTER — Other Ambulatory Visit: Payer: Self-pay | Admitting: Internal Medicine

## 2018-08-17 ENCOUNTER — Other Ambulatory Visit: Payer: Self-pay | Admitting: Internal Medicine

## 2018-08-20 NOTE — Progress Notes (Unsigned)
Patient scheduled for 6 month follow-up and annual mammogram, and [facility] visit on  10/21/18.  Phoned patient with appointment information, and mailed reminder. Copy to [facility].

## 2018-08-24 ENCOUNTER — Other Ambulatory Visit: Payer: Self-pay | Admitting: Internal Medicine

## 2018-09-07 ENCOUNTER — Other Ambulatory Visit: Payer: Self-pay | Admitting: Internal Medicine

## 2018-09-11 ENCOUNTER — Other Ambulatory Visit
Admission: RE | Admit: 2018-09-11 | Discharge: 2018-09-11 | Disposition: A | Discharge status: Home / Self Care | Payer: Self-pay | Source: Ambulatory Visit | Attending: Student | Admitting: Student

## 2018-09-11 ENCOUNTER — Other Ambulatory Visit: Payer: Self-pay | Admitting: Internal Medicine

## 2018-09-11 DIAGNOSIS — R197 Diarrhea, unspecified: Secondary | ICD-10-CM | POA: Insufficient documentation

## 2018-09-11 LAB — GASTROINTESTINAL PANEL BY PCR, STOOL (REPLACES STOOL CULTURE)
Adenovirus F40/41: NOT DETECTED
Astrovirus: NOT DETECTED
Campylobacter species: NOT DETECTED
Cryptosporidium: NOT DETECTED
Cyclospora cayetanensis: NOT DETECTED
ENTAMOEBA HISTOLYTICA: NOT DETECTED
Enteroaggregative E coli (EAEC): NOT DETECTED
Enteropathogenic E coli (EPEC): NOT DETECTED
Enterotoxigenic E coli (ETEC): NOT DETECTED
Giardia lamblia: NOT DETECTED
NOROVIRUS GI/GII: NOT DETECTED
Plesimonas shigelloides: NOT DETECTED
Rotavirus A: NOT DETECTED
SAPOVIRUS (I, II, IV, AND V): NOT DETECTED
Salmonella species: NOT DETECTED
Shiga like toxin producing E coli (STEC): NOT DETECTED
Shigella/Enteroinvasive E coli (EIEC): NOT DETECTED
VIBRIO CHOLERAE: NOT DETECTED
Vibrio species: NOT DETECTED
Yersinia enterocolitica: NOT DETECTED

## 2018-09-11 LAB — C DIFFICILE QUICK SCREEN W PCR REFLEX
C DIFFICILE (CDIFF) TOXIN: NEGATIVE
C DIFFICLE (CDIFF) ANTIGEN: NEGATIVE
C Diff interpretation: NOT DETECTED

## 2018-09-15 ENCOUNTER — Encounter: Payer: Self-pay | Admitting: Internal Medicine

## 2018-09-15 ENCOUNTER — Ambulatory Visit (INDEPENDENT_AMBULATORY_CARE_PROVIDER_SITE_OTHER): Payer: Self-pay

## 2018-09-15 ENCOUNTER — Ambulatory Visit (INDEPENDENT_AMBULATORY_CARE_PROVIDER_SITE_OTHER): Payer: Self-pay | Admitting: Internal Medicine

## 2018-09-15 VITALS — BP 138/80 | HR 90 | Temp 98.8°F | Resp 18 | Ht 64.0 in | Wt 234.4 lb

## 2018-09-15 DIAGNOSIS — J449 Chronic obstructive pulmonary disease, unspecified: Secondary | ICD-10-CM

## 2018-09-15 DIAGNOSIS — D649 Anemia, unspecified: Secondary | ICD-10-CM

## 2018-09-15 DIAGNOSIS — M79674 Pain in right toe(s): Secondary | ICD-10-CM

## 2018-09-15 DIAGNOSIS — R1310 Dysphagia, unspecified: Secondary | ICD-10-CM

## 2018-09-15 DIAGNOSIS — F329 Major depressive disorder, single episode, unspecified: Secondary | ICD-10-CM

## 2018-09-15 DIAGNOSIS — E119 Type 2 diabetes mellitus without complications: Secondary | ICD-10-CM

## 2018-09-15 DIAGNOSIS — F32A Depression, unspecified: Secondary | ICD-10-CM

## 2018-09-15 DIAGNOSIS — R197 Diarrhea, unspecified: Secondary | ICD-10-CM

## 2018-09-15 DIAGNOSIS — Z87891 Personal history of nicotine dependence: Secondary | ICD-10-CM

## 2018-09-15 DIAGNOSIS — I10 Essential (primary) hypertension: Secondary | ICD-10-CM

## 2018-09-15 DIAGNOSIS — I2510 Atherosclerotic heart disease of native coronary artery without angina pectoris: Secondary | ICD-10-CM

## 2018-09-15 DIAGNOSIS — K219 Gastro-esophageal reflux disease without esophagitis: Secondary | ICD-10-CM

## 2018-09-15 DIAGNOSIS — Z0000 Encounter for general adult medical examination without abnormal findings: Secondary | ICD-10-CM

## 2018-09-15 DIAGNOSIS — E7800 Pure hypercholesterolemia, unspecified: Secondary | ICD-10-CM

## 2018-09-15 DIAGNOSIS — G4733 Obstructive sleep apnea (adult) (pediatric): Secondary | ICD-10-CM

## 2018-09-15 LAB — MICROALBUMIN / CREATININE URINE RATIO
CREATININE, U: 32.8 mg/dL
MICROALB/CREAT RATIO: 2.1 mg/g (ref 0.0–30.0)

## 2018-09-15 LAB — LIPID PANEL
CHOL/HDL RATIO: 3
CHOLESTEROL: 89 mg/dL (ref 0–200)
HDL: 32.9 mg/dL — ABNORMAL LOW (ref >39.00)
LDL Cholesterol: 35 mg/dL (ref 0–99)
NonHDL: 55.89
TRIGLYCERIDES: 103 mg/dL (ref 0.0–149.0)
VLDL: 20.6 mg/dL (ref 0.0–40.0)

## 2018-09-15 LAB — BASIC METABOLIC PANEL
BUN: 6 mg/dL (ref 6–23)
CHLORIDE: 103 meq/L (ref 96–112)
CO2: 28 mEq/L (ref 19–32)
Calcium: 9.4 mg/dL (ref 8.4–10.5)
Creatinine, Ser: 0.65 mg/dL (ref 0.40–1.20)
GFR: 98 mL/min (ref >60.00)
Glucose, Bld: 119 mg/dL — ABNORMAL HIGH (ref 70–99)
POTASSIUM: 3.9 meq/L (ref 3.5–5.1)
Sodium: 136 mEq/L (ref 135–145)

## 2018-09-15 LAB — HEPATIC FUNCTION PANEL
ALT: 15 U/L (ref 0–35)
AST: 18 U/L (ref 0–37)
Albumin: 3.6 g/dL (ref 3.5–5.2)
Alkaline Phosphatase: 57 U/L (ref 39–117)
BILIRUBIN DIRECT: 0.1 mg/dL (ref 0.0–0.3)
TOTAL PROTEIN: 6.7 g/dL (ref 6.0–8.3)
Total Bilirubin: 0.4 mg/dL (ref 0.2–1.2)

## 2018-09-15 LAB — HEMOGLOBIN A1C: HEMOGLOBIN A1C: 6.5 % (ref 4.6–6.5)

## 2018-09-15 LAB — CALPROTECTIN, FECAL: CALPROTECTIN, FECAL: 236 ug/g — AB (ref 0–120)

## 2018-09-15 NOTE — Assessment & Plan Note (Signed)
S/p stent placement.  Sees cardiology.  Continue risk factor modification.

## 2018-09-15 NOTE — Assessment & Plan Note (Signed)
Breathing stable.  Continue inhalers.  Discussed the need to stop smoking.

## 2018-09-15 NOTE — Assessment & Plan Note (Signed)
Not checking sugars.  Discussed diet and exercise and importance of checking sugars.  Follow met b and a1c.

## 2018-09-15 NOTE — Assessment & Plan Note (Signed)
Follow cbc.  

## 2018-09-15 NOTE — Assessment & Plan Note (Signed)
Being followed by GI.  On colestipol.  Helped initially.  Now with recurrence.  Being worked up to confirm no infectious etiology.

## 2018-09-15 NOTE — Assessment & Plan Note (Signed)
Has been followed by psychiatry.
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
## 2018-09-15 NOTE — Assessment & Plan Note (Signed)
On lipitor.  Low cholesterol diet and exercise.  Follow lipid panel and liver function tests.   

## 2018-09-15 NOTE — Assessment & Plan Note (Signed)
Blood pressure under good control.  Continue same medication regimen.  Follow pressures.  Follow metabolic panel.   

## 2018-09-15 NOTE — Assessment & Plan Note (Signed)
Physical today 09/15/18.  She will schedule mammogram.  Colonoscopy 08/2016.  Recommended f/u colonoscopy in 08/2019.

## 2018-09-15 NOTE — Assessment & Plan Note (Signed)
Controlled on current regimen.   

## 2018-09-15 NOTE — Assessment & Plan Note (Signed)
Discussed the need to stop smoking.  She declines to stop.

## 2018-09-15 NOTE — Assessment & Plan Note (Signed)
CPAP.  

## 2018-09-15 NOTE — Assessment & Plan Note (Signed)
Some issues with swallowing.  Seeing GI.  Discussed MBSS.  She will notify me or GI if decides to pursue.

## 2018-09-16 ENCOUNTER — Other Ambulatory Visit: Payer: Self-pay | Admitting: Internal Medicine

## 2018-09-16 ENCOUNTER — Telehealth: Payer: Self-pay | Admitting: Internal Medicine

## 2018-09-16 DIAGNOSIS — S92511A Displaced fracture of proximal phalanx of right lesser toe(s), initial encounter for closed fracture: Secondary | ICD-10-CM

## 2018-09-16 LAB — PANCREATIC ELASTASE, FECAL: Pancreatic Elastase-1, Stool: >500 ug Elast./g (ref >200)

## 2018-09-16 NOTE — Telephone Encounter (Signed)
Copied from [place] [id number]. Topic: General - Other >> Sep 16, 2018  1:18 PM Yvette Rack wrote: Reason for CRM: pt calling stating that she had gotten a call from [facility] for a referral she states that she doesn't want to go there she wants to go to the [place] [facility] to see Dr Elvina Mattes there

## 2018-09-16 NOTE — Progress Notes (Signed)
Order placed for podiatry referral.   

## 2018-09-18 NOTE — Telephone Encounter (Signed)
Submitted to [facility] podiatry through rms

## 2018-10-21 ENCOUNTER — Ambulatory Visit: Payer: Self-pay

## 2018-10-21 ENCOUNTER — Other Ambulatory Visit: Payer: Self-pay

## 2018-11-04 ENCOUNTER — Ambulatory Visit: Payer: Self-pay

## 2018-11-09 ENCOUNTER — Encounter: Payer: Self-pay | Admitting: *Deleted

## 2018-11-09 ENCOUNTER — Ambulatory Visit: Payer: Self-pay | Admitting: Anesthesiology

## 2018-11-09 ENCOUNTER — Encounter: Payer: Self-pay | Admitting: Internal Medicine

## 2018-11-09 ENCOUNTER — Encounter
Admission: RE | Disposition: A | Discharge status: Home / Self Care | Payer: Self-pay | Source: Home / Self Care | Attending: Unknown Physician Specialty

## 2018-11-09 ENCOUNTER — Ambulatory Visit
Admission: RE | Admit: 2018-11-09 | Discharge: 2018-11-09 | Disposition: A | Discharge status: Home / Self Care | Payer: Self-pay | Attending: Unknown Physician Specialty | Admitting: Unknown Physician Specialty

## 2018-11-09 DIAGNOSIS — Z7951 Long term (current) use of inhaled steroids: Secondary | ICD-10-CM | POA: Insufficient documentation

## 2018-11-09 DIAGNOSIS — I10 Essential (primary) hypertension: Secondary | ICD-10-CM | POA: Insufficient documentation

## 2018-11-09 DIAGNOSIS — D649 Anemia, unspecified: Secondary | ICD-10-CM

## 2018-11-09 DIAGNOSIS — K219 Gastro-esophageal reflux disease without esophagitis: Secondary | ICD-10-CM | POA: Insufficient documentation

## 2018-11-09 DIAGNOSIS — K640 First degree hemorrhoids: Secondary | ICD-10-CM | POA: Insufficient documentation

## 2018-11-09 DIAGNOSIS — Z7982 Long term (current) use of aspirin: Secondary | ICD-10-CM | POA: Insufficient documentation

## 2018-11-09 DIAGNOSIS — Z888 Allergy status to other drugs, medicaments and biological substances status: Secondary | ICD-10-CM | POA: Insufficient documentation

## 2018-11-09 DIAGNOSIS — G4730 Sleep apnea, unspecified: Secondary | ICD-10-CM | POA: Insufficient documentation

## 2018-11-09 DIAGNOSIS — K5730 Diverticulosis of large intestine without perforation or abscess without bleeding: Secondary | ICD-10-CM | POA: Insufficient documentation

## 2018-11-09 DIAGNOSIS — M479 Spondylosis, unspecified: Secondary | ICD-10-CM | POA: Insufficient documentation

## 2018-11-09 DIAGNOSIS — J449 Chronic obstructive pulmonary disease, unspecified: Secondary | ICD-10-CM | POA: Insufficient documentation

## 2018-11-09 DIAGNOSIS — K529 Noninfective gastroenteritis and colitis, unspecified: Secondary | ICD-10-CM | POA: Insufficient documentation

## 2018-11-09 DIAGNOSIS — Z96653 Presence of artificial knee joint, bilateral: Secondary | ICD-10-CM | POA: Insufficient documentation

## 2018-11-09 DIAGNOSIS — F17210 Nicotine dependence, cigarettes, uncomplicated: Secondary | ICD-10-CM | POA: Insufficient documentation

## 2018-11-09 DIAGNOSIS — Z955 Presence of coronary angioplasty implant and graft: Secondary | ICD-10-CM | POA: Insufficient documentation

## 2018-11-09 DIAGNOSIS — Z79899 Other long term (current) drug therapy: Secondary | ICD-10-CM | POA: Insufficient documentation

## 2018-11-09 DIAGNOSIS — D509 Iron deficiency anemia, unspecified: Secondary | ICD-10-CM | POA: Insufficient documentation

## 2018-11-09 DIAGNOSIS — E119 Type 2 diabetes mellitus without complications: Secondary | ICD-10-CM | POA: Insufficient documentation

## 2018-11-09 DIAGNOSIS — Z8249 Family history of ischemic heart disease and other diseases of the circulatory system: Secondary | ICD-10-CM | POA: Insufficient documentation

## 2018-11-09 DIAGNOSIS — I2510 Atherosclerotic heart disease of native coronary artery without angina pectoris: Secondary | ICD-10-CM | POA: Insufficient documentation

## 2018-11-09 DIAGNOSIS — K589 Irritable bowel syndrome without diarrhea: Secondary | ICD-10-CM | POA: Insufficient documentation

## 2018-11-09 DIAGNOSIS — F329 Major depressive disorder, single episode, unspecified: Secondary | ICD-10-CM | POA: Insufficient documentation

## 2018-11-09 DIAGNOSIS — E781 Pure hyperglyceridemia: Secondary | ICD-10-CM | POA: Insufficient documentation

## 2018-11-09 DIAGNOSIS — Z6839 Body mass index (BMI) 39.0-39.9, adult: Secondary | ICD-10-CM | POA: Insufficient documentation

## 2018-11-09 DIAGNOSIS — Z7984 Long term (current) use of oral hypoglycemic drugs: Secondary | ICD-10-CM | POA: Insufficient documentation

## 2018-11-09 HISTORY — PX: COLONOSCOPY WITH PROPOFOL: SHX5780

## 2018-11-09 LAB — GLUCOSE, CAPILLARY: Glucose-Capillary: 139 mg/dL — ABNORMAL HIGH (ref 70–99)

## 2018-11-09 SURGERY — COLONOSCOPY WITH PROPOFOL
Anesthesia: General

## 2018-11-09 MED ORDER — IPRATROPIUM-ALBUTEROL 0.5-2.5 (3) MG/3ML IN SOLN: 3.0000 mL | Freq: Once | RESPIRATORY_TRACT | Status: DC

## 2018-11-09 MED ORDER — IPRATROPIUM-ALBUTEROL 0.5-2.5 (3) MG/3ML IN SOLN
RESPIRATORY_TRACT | Status: AC
  Filled 2018-11-09: qty 3

## 2018-11-09 MED ORDER — MIDAZOLAM HCL 2 MG/2ML IJ SOLN
INTRAMUSCULAR | Status: AC
  Filled 2018-11-09: qty 2

## 2018-11-09 MED ORDER — LIDOCAINE HCL (PF) 2 % IJ SOLN
INTRAMUSCULAR | Status: DC | PRN
  Administered 2018-11-09: 100 mg

## 2018-11-09 MED ORDER — FENTANYL CITRATE (PF) 100 MCG/2ML IJ SOLN
INTRAMUSCULAR | Status: DC | PRN
  Administered 2018-11-09: 25 ug via INTRAVENOUS

## 2018-11-09 MED ORDER — PROPOFOL 500 MG/50ML IV EMUL
INTRAVENOUS | Status: AC
  Filled 2018-11-09: qty 50

## 2018-11-09 MED ORDER — LIDOCAINE HCL (PF) 2 % IJ SOLN
INTRAMUSCULAR | Status: AC
  Filled 2018-11-09: qty 10

## 2018-11-09 MED ORDER — MIDAZOLAM HCL 5 MG/5ML IJ SOLN
INTRAMUSCULAR | Status: DC | PRN
  Administered 2018-11-09: 2 mg via INTRAVENOUS

## 2018-11-09 MED ORDER — SODIUM CHLORIDE 0.9 % IV SOLN
INTRAVENOUS | Status: DC
  Administered 2018-11-09: 1000 mL via INTRAVENOUS

## 2018-11-09 MED ORDER — SODIUM CHLORIDE 0.9 % IV SOLN: INTRAVENOUS | Status: DC

## 2018-11-09 MED ORDER — FENTANYL CITRATE (PF) 100 MCG/2ML IJ SOLN
INTRAMUSCULAR | Status: AC
  Filled 2018-11-09: qty 2

## 2018-11-09 MED ORDER — PROPOFOL 10 MG/ML IV BOLUS
INTRAVENOUS | Status: DC | PRN
  Administered 2018-11-09: 20 mg via INTRAVENOUS

## 2018-11-09 MED ORDER — PROPOFOL 500 MG/50ML IV EMUL
INTRAVENOUS | Status: DC | PRN
  Administered 2018-11-09: 50 ug/kg/min via INTRAVENOUS

## 2018-11-09 MED ORDER — PIPERACILLIN-TAZOBACTAM 3.375 G IVPB 30 MIN
3.3750 g | Freq: Once | INTRAVENOUS | Status: AC
  Administered 2018-11-09: 3.375 g via INTRAVENOUS
  Filled 2018-11-09: qty 50

## 2018-11-09 NOTE — Anesthesia Preprocedure Evaluation (Addendum)
Anesthesia Evaluation  Patient identified by MRN, date of birth, ID band Patient awake    Reviewed: Allergy & Precautions, H&P , NPO status , Patient's Chart, lab work & pertinent test results  Airway Mallampati: III       Dental  (+) Missing   Pulmonary asthma , sleep apnea , COPD, Recent URI  (pt states she was sick about 2 weeks ago with cough, fever.  Now has residual non-productive cough but no fevers. Energy, appetite are back to normal), Residual Cough, Current Smoker,     + wheezing (scattered wheeze)      Cardiovascular hypertension, + angina (stable) + CAD and + Cardiac Stents   Rhythm:regular Rate:Normal  LHC 05/14/18 results and cardiology plan:  The patient has had progressive canadian class 3 anginal symptoms with a high probability stress test with risk factors including diabetes, high blood pressure, high cholesterol and smoking.  With inferior myocardial perfusion defect  normal left ventricular function with ejection fraction of 55%  severe 1 vessel coronary artery disease   There is significant stenosis of right coronary artery with significant diffuse coronary artery atherosclerosis throughout the entire length of the right coronary artery including proximal and mid disease throughout prior stent Diffuse and significant occlusion of entire distal right coronary artery with collaterals to PDA and PL from left septal  Plan Continue medical management of CAD risk factors, Additional medications for management of angina and No further cardiac intervention at this time Patient will continue on antiplatelet medication management isosorbide beta-blocker calcium channel blocker and high intensity cholesterol therapy She has been instructed to stop smoking  NM Perfusion Stress Test June 2019: Normal Lexiscan infusion EKG Moderate intensive moderate sized perfusion defect of anterior myocardium consistent with  myocardial ischemia Artifact was seen  Pt states angina is unchanged   Neuro/Psych PSYCHIATRIC DISORDERS Depression negative neurological ROS  negative psych ROS   GI/Hepatic Neg liver ROS, GERD  ,  Endo/Other  diabetesMorbid obesity  Renal/GU negative Renal ROS  negative genitourinary   Musculoskeletal  (+) Arthritis ,   Abdominal   Peds  Hematology  (+) Blood dyscrasia, anemia ,   Anesthesia Other Findings Past Medical History: No date: Anemia No date: Arthritis     Comment:  back and knees No date: Asthma No date: Chronic diarrhea No date: COPD (chronic obstructive pulmonary disease) (HCC) No date: Coronary artery disease     Comment:  s/p stent placement 06/25/06 No date: Depression     Comment:  secondary to the death of her husband (died 59) Diagnosed 2008-07-09: Diabetes mellitus without complication ([place]) No date: Diverticulitis No date: Fatty infiltration of liver No date: GERD (gastroesophageal reflux disease) No date: Hypertension No date: Hypertriglyceridemia No date: Sleep apnea     Comment:  on CPAP No date: Spastic colon No date: Tobacco abuse  Past Surgical History: with left ovary in place 1996: [place]: APPENDECTOMY 10/14/2017: BREAST BIOPSY; Left     Comment:  benign 2007: CARDIAC CATHETERIZATION     Comment:  stents 1984: [place]: CHOLECYSTECTOMY 09/13/2016: COLONOSCOPY WITH PROPOFOL; N/A     Comment:  Procedure: COLONOSCOPY WITH PROPOFOL;  Surgeon: Manya Silvas, MD;  Location: [facility] ENDOSCOPY;  Service:               Endoscopy;  Laterality: N/A; 02/02/2018: ESOPHAGOGASTRODUODENOSCOPY (EGD) WITH PROPOFOL; N/A     Comment:  Procedure: ESOPHAGOGASTRODUODENOSCOPY (EGD) WITH  PROPOFOL;  Surgeon: Manya Silvas, MD;  Location:               [facility] ENDOSCOPY;  Service: Endoscopy;  Laterality: N/A; No date: JOINT REPLACEMENT     Comment:  bilateral knee replacements Arthroscopic  left knee surgery : KNEE ARTHROSCOPY status post knee surgey : KNEE SURGERY 05/14/2018: LEFT HEART CATH AND CORONARY ANGIOGRAPHY; Left     Comment:  Procedure: LEFT HEART CATH AND CORONARY ANGIOGRAPHY;                Surgeon: Corey Skains, MD;  Location: [place]               CV LAB;  Service: Cardiovascular;  Laterality: Left; ([facility]): REPLACEMENT TOTAL KNEE shoulder operation secondary to a torn tendon: SHOULDER SURGERY  BMI    Body Mass Index:  39.48 kg/m      Reproductive/Obstetrics negative OB ROS                           Anesthesia Physical Anesthesia Plan  ASA: III  Anesthesia Plan: General   Post-op Pain Management:    Induction:   PONV Risk Score and Plan: Propofol infusion and TIVA  Airway Management Planned: Natural Airway and Nasal Cannula  Additional Equipment:   Intra-op Plan:   Post-operative Plan:   Informed Consent: I have reviewed the patients History and Physical, chart, labs and discussed the procedure including the risks, benefits and alternatives for the proposed anesthesia with the patient or authorized representative who has indicated his/her understanding and acceptance.   Dental Advisory Given  Plan Discussed with: Anesthesiologist, CRNA and Surgeon  Anesthesia Plan Comments: (Discussed increased risk of respiratory complications given recent respiratory infection.  Discussed that if we had known about her illness we would have recommended rescheduling her procedure.  She states she understands the increased risks and does not wish to reschedule.  Will give albuterol/ipratroprium treatment pre-procedure.)       Anesthesia Quick Evaluation

## 2018-11-09 NOTE — Op Note (Signed)
[facility] Gastroenterology Patient Name: Kathryn Friedman Procedure Date: 11/09/2018 8:15 AM MRN: [id number] Account #: [id number] Date of Birth: November 30, 1955 Admit Type: Outpatient Age: 62 Room: [facility] ENDO ROOM 1 Gender: Female Note Status: Finalized Procedure:            Colonoscopy Indications:          Chronic diarrhea Providers:            Manya Silvas, MD Referring MD:         Einar Pheasant, MD (Referring MD) Medicines:            Propofol per Anesthesia Complications:        No immediate complications. Procedure:            Pre-Anesthesia Assessment:                       - After reviewing the risks and benefits, the patient                        was deemed in satisfactory condition to undergo the                        procedure.                       After obtaining informed consent, the colonoscope was                        passed under direct vision. Throughout the procedure,                        the patient's blood pressure, pulse, and oxygen                        saturations were monitored continuously. The                        Colonoscope was introduced through the anus and                        advanced to the the cecum, identified by appendiceal                        orifice and ileocecal valve. The colonoscopy was                        performed without difficulty. The patient tolerated the                        procedure well. Findings:      A few medium-mouthed diverticula were found in the ascending colon.      A few small-mouthed diverticula were found in the sigmoid colon and       ascending colon.      Internal hemorrhoids were found during endoscopy. The hemorrhoids were       small and Grade I (internal hemorrhoids that do not prolapse).      Biopsies done of ascending, transverse,descending and sigmoid colon.      The exam was otherwise without abnormality. Impression:           - Diverticulosis in the ascending  colon.                       -  Diverticulosis in the sigmoid colon and in the                        ascending colon.                       - Internal hemorrhoids.                       - The examination was otherwise normal.                       - No specimens collected. Recommendation:       - Await pathology results. Manya Silvas, MD 11/09/2018 9:07:01 AM This report has been signed electronically. Number of Addenda: 0 Note Initiated On: 11/09/2018 8:15 AM Scope Withdrawal Time: 0 hours 10 minutes 53 seconds  Total Procedure Duration: 0 hours 16 minutes 29 seconds       [facility]

## 2018-11-09 NOTE — Anesthesia Post-op Follow-up Note (Signed)
Anesthesia QCDR form completed.        

## 2018-11-09 NOTE — Transfer of Care (Signed)
Immediate Anesthesia Transfer of Care Note  Patient: Kathryn Friedman  Procedure(s) Performed: COLONOSCOPY WITH PROPOFOL (N/A )  Patient Location: PACU  Anesthesia Type:General  Level of Consciousness: awake, alert  and oriented  Airway & Oxygen Therapy: Patient Spontanous Breathing and Patient connected to nasal cannula oxygen  Post-op Assessment: Report given to RN and Post -op Vital signs reviewed and stable  Post vital signs: Reviewed and stable  Last Vitals:  Vitals Value Taken Time  BP 104/64 11/09/2018  9:08 AM  Temp    Pulse 65 11/09/2018  9:08 AM  Resp 21 11/09/2018  9:08 AM  SpO2 94 % 11/09/2018  9:08 AM  Vitals shown include unvalidated device data.  Last Pain:  Vitals:   11/09/18 0757  TempSrc: Tympanic  PainSc: 0-No pain         Complications: No apparent anesthesia complications

## 2018-11-09 NOTE — H&P (Signed)
Primary Care Physician:  Einar Pheasant, MD Primary Gastroenterologist:  Dr. Vira Agar  Pre-Procedure History & Physical: HPI:  Kathryn Friedman is a 62 y.o. female is here for a colonoscopy for iron def anemia. colonoscopy.   Past Medical History:  Diagnosis Date  . Anemia   . Arthritis    back and knees  . Asthma   . Chronic diarrhea   . COPD (chronic obstructive pulmonary disease) ([place])   . Coronary artery disease    s/p stent placement 06/25/06  . Depression    secondary to the death of her husband (died 87)  . Diabetes mellitus without complication ([place]) Diagnosed 05/2008  . Diverticulitis   . Fatty infiltration of liver   . GERD (gastroesophageal reflux disease)   . Hypertension   . Hypertriglyceridemia   . Sleep apnea    on CPAP  . Spastic colon   . Tobacco abuse     Past Surgical History:  Procedure Laterality Date  . ABDOMINAL HYSTERECTOMY  with left ovary in place 1996  . APPENDECTOMY  1985  . BREAST BIOPSY Left 10/14/2017   benign  . CARDIAC CATHETERIZATION  2007   stents  . CESAREAN SECTION  1984  . CHOLECYSTECTOMY  1985  . COLONOSCOPY WITH PROPOFOL N/A 09/13/2016   Procedure: COLONOSCOPY WITH PROPOFOL;  Surgeon: Manya Silvas, MD;  Location: [facility]COPY;  Service: Endoscopy;  Laterality: N/A;  . ESOPHAGOGASTRODUODENOSCOPY (EGD) WITH PROPOFOL N/A 02/02/2018   Procedure: ESOPHAGOGASTRODUODENOSCOPY (EGD) WITH PROPOFOL;  Surgeon: Manya Silvas, MD;  Location: [facility] ENDOSCOPY;  Service: Endoscopy;  Laterality: N/A;  . JOINT REPLACEMENT     bilateral knee replacements  . KNEE ARTHROSCOPY  Arthroscopic left knee surgery   . KNEE SURGERY  status post knee surgey   . LEFT HEART CATH AND CORONARY ANGIOGRAPHY Left 05/14/2018   Procedure: LEFT HEART CATH AND CORONARY ANGIOGRAPHY;  Surgeon: Corey Skains, MD;  Location: [place] CV LAB;  Service: Cardiovascular;  Laterality: Left;  . REPLACEMENT TOTAL KNEE  ([facility])  . SHOULDER SURGERY   shoulder operation secondary to a torn tendon    Prior to Admission medications   Medication Sig Start Date End Date Taking? Authorizing Provider  albuterol (VENTOLIN HFA) 108 (90 BASE) MCG/ACT inhaler Inhale 2 puffs into the lungs every 4 (four) hours as needed. Patient taking differently: Inhale 2 puffs into the lungs 4 (four) times daily.  05/01/15  Yes Einar Pheasant, MD  amLODipine (NORVASC) 5 MG tablet TAKE ONE TABLET BY MOUTH EVERY DAY 08/18/18  Yes Einar Pheasant, MD  isosorbide mononitrate (IMDUR) 30 MG 24 hr tablet Take by mouth. 06/11/18 06/11/19 Yes [provider]  pantoprazole (PROTONIX) 40 MG tablet TAKE ONE TABLET BY MOUTH EVERY DAY Patient taking differently: Take 40 mg by mouth twice daily 11/05/17  Yes Einar Pheasant, MD  propranolol (INDERAL) 40 MG tablet TAKE ONE TABLET BY MOUTH 2 TIMES A DAY 08/25/18  Yes Einar Pheasant, MD  ADVAIR DISKUS 250-50 MCG/DOSE AEPB INHALE 1 PUFF 2 TIMES A DAY. RINSE MOUTH AND SPIT AFTER EACH USE. 06/12/18   Einar Pheasant, MD  aspirin 81 MG tablet Take 81 mg by mouth daily.     [provider]  atorvastatin (LIPITOR) 40 MG tablet TAKE ONE TABLET BY MOUTH EVERY EVENING 08/18/18   Einar Pheasant, MD  Brexpiprazole (REXULTI) 2 MG TABS Take 2 mg by mouth daily.    [provider]  CALCIUM-VITAMIN D PO Take 1 tablet by mouth daily.  [provider]  clopidogrel (PLAVIX) 75 MG tablet TAKE ONE TABLET BY MOUTH EVERY DAY 09/09/18   Einar Pheasant, MD  colestipol (COLESTID) 1 g tablet Take 2 g by mouth daily.     [provider]  cyclobenzaprine (FLEXERIL) 10 MG tablet TAKE ONE TABLET BY MOUTH 3 TIMES A DAY AS NEEDED FOR MUSCLE SPASMS 09/16/18   Einar Pheasant, MD  DULoxetine (CYMBALTA) 60 MG capsule Take 60 mg by mouth daily.    [provider]  liraglutide (VICTOZA) 18 MG/3ML SOPN Inject 0.6 mg subcutaneously daily for 1 week, then increase to 1.2 mg daily for 1 week, then increase to 1.8 mg daily  thereafter. Patient taking differently: Inject 1.8 mg into the skin daily.  01/05/18   Einar Pheasant, MD  metFORMIN (GLUCOPHAGE-XR) 500 MG 24 hr tablet TAKE TWO TABLETS (1000MG ) BY MOUTH 2 TIMES A DAY 07/13/18   Einar Pheasant, MD  mupirocin ointment (BACTROBAN) 2 % Apply to affected area bid Patient not taking: Reported on 05/08/2018 04/21/18   Einar Pheasant, MD  nystatin (NYSTATIN) powder APPLY TOPICALLY 2 TIMES A DAY 09/14/18   Einar Pheasant, MD  nystatin cream (MYCOSTATIN) APPLY ONE APPLICATION TOPICALLY 2 TIMES A DAY Patient taking differently: Apply 1 application topically twice daily as needed for yeast infection 04/09/18   Einar Pheasant, MD  sucralfate (CARAFATE) 1 g tablet Take 1 g by mouth 2 (two) times daily before a meal.     [provider]  traZODone (DESYREL) 100 MG tablet Take 200 mg by mouth at bedtime.     [provider]    Allergies as of 10/05/2018 - Review Complete 09/15/2018  Allergen Reaction Noted  . Chantix [varenicline] Other (See Comments) 12/15/2017  . Jardiance [empagliflozin] Other (See Comments) 12/22/2017    Family History  Problem Relation Age of Onset  . Other Mother        Hit by a fire truck and has had multiple operations on her back , and has history of MVP   . Mitral valve prolapse Mother   . Breast cancer Mother   . Heart disease Father        myocardial infarction and is status post bypass surgery  . Mitral valve prolapse Sister   . Hepatitis C Brother   . Cirrhosis Brother   . Colon cancer Paternal Aunt     Social History   Socioeconomic History  . Marital status: Married    Spouse name: Not on file  . Number of children: 1  . Years of education: Not on file  . Highest education level: Not on file  Occupational History    Employer: [employer]  Social Needs  . Financial resource strain: Not on file  . Food insecurity:    Worry: Not on file    Inability: Not on file  . Transportation needs:    Medical: Not on file     Non-medical: Not on file  Tobacco Use  . Smoking status: Current Every Day Smoker    Packs/day: 2.00    Years: 45.00    Pack years: 90.00    Types: Cigarettes  . Smokeless tobacco: Never Used  Substance and Sexual Activity  . Alcohol use: No    Alcohol/week: 0.0 standard drinks  . Drug use: No  . Sexual activity: Not on file  Lifestyle  . Physical activity:    Days per week: Not on file    Minutes per session: Not on file  . Stress: Not on  file  Relationships  . Social connections:    Talks on phone: Not on file    Gets together: Not on file    Attends religious service: Not on file    Active member of club or organization: Not on file    Attends meetings of clubs or organizations: Not on file    Relationship status: Not on file  . Intimate partner violence:    Fear of current or ex partner: Not on file    Emotionally abused: Not on file    Physically abused: Not on file    Forced sexual activity: Not on file  Other Topics Concern  . Not on file  Social History Narrative  . Not on file    Review of Systems: See HPI, otherwise negative ROS  Physical Exam: BP 119/70   Pulse 70   Temp 98.6 F (37 C) (Tympanic)   Resp 20   Ht 5\' 4"  (1.626 m)   Wt 104.3 kg   SpO2 96%   BMI 39.48 kg/m  General:   Alert,  pleasant and cooperative in NAD Head:  Normocephalic and atraumatic. Neck:  Supple; no masses or thyromegaly. Lungs:  Clear throughout to auscultation.    Heart:  Regular rate and rhythm. Abdomen:  Soft, nontender and nondistended. Normal bowel sounds, without guarding, and without rebound.   Neurologic:  Alert and  oriented x4;  grossly normal neurologically.  Impression/Plan: Kathryn Friedman is here for an colonoscopy to be performed for iron def anemia and diarrhea.  Risks, benefits, limitations, and alternatives regarding  colonoscopy have been reviewed with the patient.  Questions have been answered.  All parties agreeable.   Gaylyn Cheers, MD  11/09/2018, 8:26 AM

## 2018-11-09 NOTE — Anesthesia Postprocedure Evaluation (Signed)
Anesthesia Post Note  Patient: Kathryn Friedman  Procedure(s) Performed: COLONOSCOPY WITH PROPOFOL (N/A )  Patient location during evaluation: PACU Anesthesia Type: General Level of consciousness: awake and alert Pain management: pain level controlled Vital Signs Assessment: post-procedure vital signs reviewed and stable Respiratory status: spontaneous breathing, nonlabored ventilation, respiratory function stable and patient connected to nasal cannula oxygen Cardiovascular status: blood pressure returned to baseline and stable Postop Assessment: no apparent nausea or vomiting Anesthetic complications: no     Last Vitals:  Vitals:   11/09/18 0928 11/09/18 0938  BP: 107/61 123/63  Pulse: 66 65  Resp: 20 17  Temp:    SpO2: 97% 96%    Last Pain:  Vitals:   11/09/18 0938  TempSrc:   PainSc: 0-No pain                 Durenda Hurt

## 2018-11-10 NOTE — Telephone Encounter (Signed)
Orders placed for f/u cbc and iron studies.  

## 2018-11-10 NOTE — Telephone Encounter (Signed)
Please call pt and let her know that I would like to repeat her cbc and iron studies and then can determine need for iron.  If agreeable, please schedule a non fasting lab and let me know and I will order the labs.

## 2018-11-10 NOTE — Telephone Encounter (Signed)
I called patient earlier and did not get in touch with her so I sent her a my chart message. She has scheduled her lab appt for tomorrow.

## 2018-11-10 NOTE — Telephone Encounter (Signed)
LMTCB

## 2018-11-11 ENCOUNTER — Other Ambulatory Visit: Payer: Self-pay

## 2018-11-11 LAB — SURGICAL PATHOLOGY

## 2018-11-16 ENCOUNTER — Other Ambulatory Visit (INDEPENDENT_AMBULATORY_CARE_PROVIDER_SITE_OTHER): Payer: Self-pay

## 2018-11-16 DIAGNOSIS — D649 Anemia, unspecified: Secondary | ICD-10-CM

## 2018-11-16 LAB — IBC PANEL
Iron: 62 ug/dL (ref 42–145)
Saturation Ratios: 16 % — ABNORMAL LOW (ref 20.0–50.0)
Transferrin: 276 mg/dL (ref 212.0–360.0)

## 2018-11-16 LAB — CBC WITH DIFFERENTIAL/PLATELET
Basophils Absolute: 0.1 10*3/uL (ref 0.0–0.1)
Basophils Relative: 0.6 % (ref 0.0–3.0)
Eosinophils Absolute: 0.2 10*3/uL (ref 0.0–0.7)
Eosinophils Relative: 2.1 % (ref 0.0–5.0)
HCT: 42.3 % (ref 36.0–46.0)
Hemoglobin: 14.1 g/dL (ref 12.0–15.0)
Lymphocytes Relative: 19.1 % (ref 12.0–46.0)
Lymphs Abs: 1.9 10*3/uL (ref 0.7–4.0)
MCHC: 33.3 g/dL (ref 30.0–36.0)
MCV: 85.8 fl (ref 78.0–100.0)
MONOS PCT: 5 % (ref 3.0–12.0)
Monocytes Absolute: 0.5 10*3/uL (ref 0.1–1.0)
NEUTROS PCT: 73.2 % (ref 43.0–77.0)
Neutro Abs: 7.4 10*3/uL (ref 1.4–7.7)
Platelets: 236 10*3/uL (ref 150.0–400.0)
RBC: 4.93 Mil/uL (ref 3.87–5.11)
RDW: 18.8 % — ABNORMAL HIGH (ref 11.5–15.5)
WBC: 10.1 10*3/uL (ref 4.0–10.5)

## 2018-11-16 LAB — FERRITIN: FERRITIN: 30.4 ng/mL (ref 10.0–291.0)

## 2018-11-17 ENCOUNTER — Encounter: Payer: Self-pay | Admitting: Internal Medicine

## 2018-11-23 ENCOUNTER — Other Ambulatory Visit: Payer: Self-pay | Admitting: Internal Medicine

## 2018-11-23 NOTE — Telephone Encounter (Signed)
Last fill 09/16/18 last OV 09/15/18 ok to fill?

## 2018-11-23 NOTE — Telephone Encounter (Signed)
rx cyclobenzaprine #90 1 refill sent in to medication management.

## 2018-12-10 LAB — HM DIABETES FOOT EXAM

## 2018-12-14 ENCOUNTER — Other Ambulatory Visit: Payer: Self-pay | Admitting: Internal Medicine

## 2018-12-16 ENCOUNTER — Telehealth: Payer: Self-pay | Admitting: Pharmacy Technician

## 2018-12-16 NOTE — Telephone Encounter (Signed)
Received updated proof of income for 2020.  Patient eligible to receive medication assistance at [facility] as long as eligibility requirements continue to be met.  [facility]

## 2018-12-23 ENCOUNTER — Encounter: Payer: Self-pay | Admitting: Internal Medicine

## 2018-12-24 ENCOUNTER — Emergency Department
Admission: EM | Admit: 2018-12-24 | Discharge: 2018-12-24 | Disposition: A | Discharge status: Home / Self Care | Payer: Self-pay | Attending: Emergency Medicine | Admitting: Emergency Medicine

## 2018-12-24 ENCOUNTER — Encounter: Payer: Self-pay | Admitting: Emergency Medicine

## 2018-12-24 ENCOUNTER — Ambulatory Visit: Payer: Self-pay | Admitting: *Deleted

## 2018-12-24 ENCOUNTER — Other Ambulatory Visit: Payer: Self-pay

## 2018-12-24 ENCOUNTER — Emergency Department: Payer: Self-pay

## 2018-12-24 DIAGNOSIS — W5501XA Bitten by cat, initial encounter: Secondary | ICD-10-CM | POA: Insufficient documentation

## 2018-12-24 DIAGNOSIS — Z79899 Other long term (current) drug therapy: Secondary | ICD-10-CM | POA: Insufficient documentation

## 2018-12-24 DIAGNOSIS — J45909 Unspecified asthma, uncomplicated: Secondary | ICD-10-CM | POA: Insufficient documentation

## 2018-12-24 DIAGNOSIS — E119 Type 2 diabetes mellitus without complications: Secondary | ICD-10-CM | POA: Insufficient documentation

## 2018-12-24 DIAGNOSIS — Y929 Unspecified place or not applicable: Secondary | ICD-10-CM | POA: Insufficient documentation

## 2018-12-24 DIAGNOSIS — Y998 Other external cause status: Secondary | ICD-10-CM | POA: Insufficient documentation

## 2018-12-24 DIAGNOSIS — S60871A Other superficial bite of right wrist, initial encounter: Secondary | ICD-10-CM | POA: Insufficient documentation

## 2018-12-24 DIAGNOSIS — I2510 Atherosclerotic heart disease of native coronary artery without angina pectoris: Secondary | ICD-10-CM | POA: Insufficient documentation

## 2018-12-24 DIAGNOSIS — F17210 Nicotine dependence, cigarettes, uncomplicated: Secondary | ICD-10-CM | POA: Insufficient documentation

## 2018-12-24 DIAGNOSIS — F329 Major depressive disorder, single episode, unspecified: Secondary | ICD-10-CM | POA: Insufficient documentation

## 2018-12-24 DIAGNOSIS — Z7902 Long term (current) use of antithrombotics/antiplatelets: Secondary | ICD-10-CM | POA: Insufficient documentation

## 2018-12-24 DIAGNOSIS — J449 Chronic obstructive pulmonary disease, unspecified: Secondary | ICD-10-CM | POA: Insufficient documentation

## 2018-12-24 DIAGNOSIS — Z9049 Acquired absence of other specified parts of digestive tract: Secondary | ICD-10-CM | POA: Insufficient documentation

## 2018-12-24 DIAGNOSIS — I10 Essential (primary) hypertension: Secondary | ICD-10-CM | POA: Insufficient documentation

## 2018-12-24 DIAGNOSIS — Y9389 Activity, other specified: Secondary | ICD-10-CM | POA: Insufficient documentation

## 2018-12-24 DIAGNOSIS — Z7982 Long term (current) use of aspirin: Secondary | ICD-10-CM | POA: Insufficient documentation

## 2018-12-24 DIAGNOSIS — Z7984 Long term (current) use of oral hypoglycemic drugs: Secondary | ICD-10-CM | POA: Insufficient documentation

## 2018-12-24 DIAGNOSIS — R52 Pain, unspecified: Secondary | ICD-10-CM

## 2018-12-24 LAB — CBC
HCT: 41.6 % (ref 36.0–46.0)
Hemoglobin: 13.6 g/dL (ref 12.0–15.0)
MCH: 28.9 pg (ref 26.0–34.0)
MCHC: 32.7 g/dL (ref 30.0–36.0)
MCV: 88.5 fL (ref 80.0–100.0)
NRBC: 0 % (ref 0.0–0.2)
Platelets: 195 10*3/uL (ref 150–400)
RBC: 4.7 MIL/uL (ref 3.87–5.11)
RDW: 17.6 % — ABNORMAL HIGH (ref 11.5–15.5)
WBC: 13 10*3/uL — AB (ref 4.0–10.5)

## 2018-12-24 LAB — BASIC METABOLIC PANEL
ANION GAP: 9 (ref 5–15)
BUN: 7 mg/dL — ABNORMAL LOW (ref 8–23)
CO2: 25 mmol/L (ref 22–32)
Calcium: 8.6 mg/dL — ABNORMAL LOW (ref 8.9–10.3)
Chloride: 102 mmol/L (ref 98–111)
Creatinine, Ser: 0.5 mg/dL (ref 0.44–1.00)
GFR calc Af Amer: >60 mL/min (ref >60)
GFR calc non Af Amer: >60 mL/min (ref >60)
Glucose, Bld: 117 mg/dL — ABNORMAL HIGH (ref 70–99)
Potassium: 4.1 mmol/L (ref 3.5–5.1)
Sodium: 136 mmol/L (ref 135–145)

## 2018-12-24 MED ORDER — AMOXICILLIN-POT CLAVULANATE 875-125 MG PO TABS: 1.0000 | ORAL_TABLET | Freq: Two times a day (BID) | ORAL | 0 refills | Status: AC

## 2018-12-24 MED ORDER — SODIUM CHLORIDE 0.9 % IV SOLN
3.0000 g | Freq: Once | INTRAVENOUS | Status: AC
  Administered 2018-12-24: 3 g via INTRAVENOUS
  Filled 2018-12-24: qty 3

## 2018-12-24 NOTE — Telephone Encounter (Signed)
Sent you a triage note on this patient. She is at the ED now.

## 2018-12-24 NOTE — ED Notes (Signed)
Pt states was bitten by her cat twice on Tuesday on R/arm. Pt states she has been cleaning the bite site with alcohol and anbx ointment with no success. Pt states "I just don't feel well". Upon assessment swelling and abrasion noticed on R/wirst and forearm w/o drainage noticed . Hot to the touch. Pt is A/o x4. NAd noted. Pt was able to ambulate w/o difficulty. Family at bedside.

## 2018-12-24 NOTE — Telephone Encounter (Signed)
Reviewed chart. In ER.

## 2018-12-24 NOTE — ED Provider Notes (Signed)
[facility] Emergency Department Provider Note  ____________________________________________  Time seen: Approximately 11:22 AM  I have reviewed the triage vital signs and the nursing notes.   HISTORY  Chief Complaint Animal Bite    HPI Kathryn Friedman is a 63 y.o. female that presents to the emergency department for evaluation of cat bite to right wrist and hand two days ago. Cat was having a seizure when he bit her.  Cat is her pet and is 76 years old and she has had it since it was a kitten.  Patient's tetanus shot was last year.  Vaccinations are up-to-date.  She tried to get an appointment to see her PCP but they were unable to see her until Monday.  No fevers or chills.    Past Medical History:  Diagnosis Date  . Anemia   . Arthritis    back and knees  . Asthma   . Chronic diarrhea   . COPD (chronic obstructive pulmonary disease) ([place])   . Coronary artery disease    s/p stent placement 06/25/06  . Depression    secondary to the death of her husband (died 80)  . Diabetes mellitus without complication ([place]) Diagnosed 05/2008  . Diverticulitis   . Fatty infiltration of liver   . GERD (gastroesophageal reflux disease)   . Hypertension   . Hypertriglyceridemia   . Sleep apnea    on CPAP  . Spastic colon   . Tobacco abuse     Patient Active Problem List   Diagnosis Date Noted  . Dysphagia 09/15/2018  . ILD (interstitial lung disease) ([place]) 08/17/2017  . Personal history of tobacco use, presenting hazards to health 08/11/2016  . Venous insufficiency of both lower extremities 07/10/2016  . Healthcare maintenance 06/18/2016  . Lump in the abdomen 08/06/2015  . Dizziness 08/06/2015  . GERD (gastroesophageal reflux disease) 10/20/2013  . Iron deficiency anemia 10/20/2013  . Nausea with vomiting 10/19/2013  . LUQ pain 10/19/2013  . Diarrhea 09/26/2013  . Anemia 04/12/2013  . CAD (coronary artery disease) 02/18/2013  . COPD (chronic  obstructive pulmonary disease) ([place]) 02/18/2013  . Obstructive sleep apnea 02/18/2013  . Depression 02/18/2013  . Diverticulosis 02/18/2013  . Diabetes ([place]) 02/17/2013  . Essential hypertension, benign 02/17/2013  . Hypercholesterolemia 02/17/2013    Past Surgical History:  Procedure Laterality Date  . ABDOMINAL HYSTERECTOMY  with left ovary in place 1996  . APPENDECTOMY  1985  . BREAST BIOPSY Left 10/14/2017   benign  . CARDIAC CATHETERIZATION  2007   stents  . CESAREAN SECTION  1984  . CHOLECYSTECTOMY  1985  . COLONOSCOPY WITH PROPOFOL N/A 09/13/2016   Procedure: COLONOSCOPY WITH PROPOFOL;  Surgeon: Manya Silvas, MD;  Location: [facility] ENDOSCOPY;  Service: Endoscopy;  Laterality: N/A;  . COLONOSCOPY WITH PROPOFOL N/A 11/09/2018   Procedure: COLONOSCOPY WITH PROPOFOL;  Surgeon: Manya Silvas, MD;  Location: [facility] ENDOSCOPY;  Service: Endoscopy;  Laterality: N/A;  . ESOPHAGOGASTRODUODENOSCOPY (EGD) WITH PROPOFOL N/A 02/02/2018   Procedure: ESOPHAGOGASTRODUODENOSCOPY (EGD) WITH PROPOFOL;  Surgeon: Manya Silvas, MD;  Location: [facility] ENDOSCOPY;  Service: Endoscopy;  Laterality: N/A;  . JOINT REPLACEMENT     bilateral knee replacements  . KNEE ARTHROSCOPY  Arthroscopic left knee surgery   . KNEE SURGERY  status post knee surgey   . LEFT HEART CATH AND CORONARY ANGIOGRAPHY Left 05/14/2018   Procedure: LEFT HEART CATH AND CORONARY ANGIOGRAPHY;  Surgeon: Corey Skains, MD;  Location: [place] CV LAB;  Service:  Cardiovascular;  Laterality: Left;  . REPLACEMENT TOTAL KNEE  ([facility])  . SHOULDER SURGERY  shoulder operation secondary to a torn tendon    Prior to Admission medications   Medication Sig Start Date End Date Taking? Authorizing Provider  ADVAIR DISKUS 250-50 MCG/DOSE AEPB INHALE 1 PUFF 2 TIMES A DAY. RINSE MOUTH AND SPIT AFTER EACH USE. 06/12/18   Einar Pheasant, MD  albuterol (VENTOLIN HFA) 108 (90 BASE) MCG/ACT inhaler Inhale 2 puffs into the lungs every 4 (four)  hours as needed. Patient taking differently: Inhale 2 puffs into the lungs 4 (four) times daily.  05/01/15   Einar Pheasant, MD  amLODipine (NORVASC) 5 MG tablet TAKE ONE TABLET BY MOUTH EVERY DAY 08/18/18   Einar Pheasant, MD  amoxicillin-clavulanate (AUGMENTIN) 875-125 MG tablet Take 1 tablet by mouth 2 (two) times daily for 10 days. 12/24/18 01/03/19  Laban Emperor, PA-C  aspirin 81 MG tablet Take 81 mg by mouth daily.     [provider]  atorvastatin (LIPITOR) 40 MG tablet TAKE ONE TABLET BY MOUTH EVERY EVENING 08/18/18   Einar Pheasant, MD  Brexpiprazole (REXULTI) 2 MG TABS Take 2 mg by mouth daily.    [provider]  CALCIUM-VITAMIN D PO Take 1 tablet by mouth daily.     [provider]  clopidogrel (PLAVIX) 75 MG tablet TAKE ONE TABLET BY MOUTH EVERY DAY 12/14/18   Einar Pheasant, MD  colestipol (COLESTID) 1 g tablet Take 2 g by mouth daily.     [provider]  cyclobenzaprine (FLEXERIL) 10 MG tablet TAKE ONE TABLET BY MOUTH 3 TIMES A DAY AS NEEDED FOR MUSCLE SPASMS 11/23/18   Einar Pheasant, MD  DULoxetine (CYMBALTA) 60 MG capsule Take 60 mg by mouth daily.    [provider]  isosorbide mononitrate (IMDUR) 30 MG 24 hr tablet Take by mouth. 06/11/18 06/11/19  [provider]  liraglutide (VICTOZA) 18 MG/3ML SOPN Inject 0.6 mg subcutaneously daily for 1 week, then increase to 1.2 mg daily for 1 week, then increase to 1.8 mg daily thereafter. Patient taking differently: Inject 1.8 mg into the skin daily.  01/05/18   Einar Pheasant, MD  metFORMIN (GLUCOPHAGE-XR) 500 MG 24 hr tablet TAKE TWO TABLETS (1000MG ) BY MOUTH 2 TIMES A DAY 07/13/18   Einar Pheasant, MD  mupirocin ointment (BACTROBAN) 2 % Apply to affected area bid Patient not taking: Reported on 05/08/2018 04/21/18   Einar Pheasant, MD  nystatin (NYSTATIN) powder APPLY TOPICALLY 2 TIMES A DAY 09/14/18   Einar Pheasant, MD  nystatin cream (MYCOSTATIN) APPLY ONE APPLICATION TOPICALLY 2  TIMES A DAY Patient taking differently: Apply 1 application topically twice daily as needed for yeast infection 04/09/18   Einar Pheasant, MD  pantoprazole (PROTONIX) 40 MG tablet TAKE ONE TABLET BY MOUTH EVERY DAY Patient taking differently: Take 40 mg by mouth twice daily 11/05/17   Einar Pheasant, MD  propranolol (INDERAL) 40 MG tablet TAKE ONE TABLET BY MOUTH 2 TIMES A DAY 08/25/18   Einar Pheasant, MD  sucralfate (CARAFATE) 1 g tablet Take 1 g by mouth 2 (two) times daily before a meal.     [provider]  traZODone (DESYREL) 100 MG tablet Take 200 mg by mouth at bedtime.     [provider]    Allergies Chantix [varenicline] and Jardiance [empagliflozin]  Family History  Problem Relation Age of Onset  . Other Mother        Hit by a fire truck and has had multiple  operations on her back , and has history of MVP   . Mitral valve prolapse Mother   . Breast cancer Mother   . Heart disease Father        myocardial infarction and is status post bypass surgery  . Mitral valve prolapse Sister   . Hepatitis C Brother   . Cirrhosis Brother   . Colon cancer Paternal Aunt     Social History Social History   Tobacco Use  . Smoking status: Current Every Day Smoker    Packs/day: 2.00    Years: 45.00    Pack years: 90.00    Types: Cigarettes  . Smokeless tobacco: Never Used  Substance Use Topics  . Alcohol use: No    Alcohol/week: 0.0 standard drinks  . Drug use: No     Review of Systems  Constitutional: No fever/chills Cardiovascular: No chest pain. Respiratory: No SOB. Gastrointestinal: No abdominal pain.  No nausea, no vomiting.  Musculoskeletal: Positive for hand and wrist pain. Skin: Negative for abrasions, ecchymosis. Positive for puncture.  Neurological: Negative for headaches   ____________________________________________   PHYSICAL EXAM:  VITAL SIGNS: ED Triage Vitals  Enc Vitals Group     BP 12/24/18 1105 136/69     Pulse Rate 12/24/18  1105 94     Resp 12/24/18 1105 16     Temp 12/24/18 1105 98.3 F (36.8 C)     Temp Source 12/24/18 1105 Oral     SpO2 12/24/18 1105 96 %     Weight 12/24/18 1052 228 lb (103.4 kg)     Height 12/24/18 1052 5\' 4"  (1.626 m)     Head Circumference --      Peak Flow --      Pain Score 12/24/18 1052 8     Pain Loc --      Pain Edu? --      Excl. in [place]? --      Constitutional: Alert and oriented. Well appearing and in no acute distress. Eyes: Conjunctivae are normal. PERRL. EOMI. Head: Atraumatic. ENT:      Ears:      Nose: No congestion/rhinnorhea.      Mouth/Throat: Mucous membranes are moist.  Neck: No stridor.  Cardiovascular: Normal rate, regular rhythm.  Good peripheral circulation. Respiratory: Normal respiratory effort without tachypnea or retractions. Lungs CTAB. Good air entry to the bases with no decreased or absent breath sounds. Musculoskeletal: Full range of motion to all extremities. No gross deformities appreciated.  Patient able to move her hand normally.  Normal grip strength.  Full range of motion in all fingers. Neurologic:  Normal speech and language. No gross focal neurologic deficits are appreciated.  Skin:  Skin is warm, dry.  Multiple punctures to left distal forearm.  Multiple punctures to dorsal ulnar side of left hand with surrounding erythema.  Erythema does not extend into fingers.  Minimal swelling.  Area is warm to touch. Psychiatric: Mood and affect are normal. Speech and behavior are normal. Patient exhibits appropriate insight and judgement.   ____________________________________________   LABS (all labs ordered are listed, but only abnormal results are displayed)  Labs Reviewed  CBC - Abnormal; Notable for the following components:      Result Value   WBC 13.0 (*)    RDW 17.6 (*)    All other components within normal limits  BASIC METABOLIC PANEL - Abnormal; Notable for the following components:   Glucose, Bld 117 (*)    BUN 7 (*)  Calcium  8.6 (*)    All other components within normal limits   ____________________________________________  EKG   ____________________________________________  RADIOLOGY Robinette Haines, personally viewed and evaluated these images (plain radiographs) as part of my medical decision making, as well as reviewing the written report by the radiologist.  Dg Forearm Left  Result Date: 12/24/2018 CLINICAL DATA:  Recent cat bite two days ago with increased pain and swelling, initial encounter EXAM: LEFT FOREARM - 2 VIEW COMPARISON:  None. FINDINGS: Mild soft tissue swelling is noted related to the recent injury. No underlying bony fracture or dislocation is seen. IMPRESSION: Mild soft tissue swelling without acute bony abnormality. Electronically Signed   By: Inez Catalina M.D.   On: 12/24/2018 12:52   Dg Hand Complete Left  Result Date: 12/24/2018 CLINICAL DATA:  Recent cat bite left hand pain, initial encounter EXAM: LEFT HAND - COMPLETE 3+ VIEW COMPARISON:  None. FINDINGS: No acute fracture or dislocation is noted. No erosive changes are seen. No gross soft tissue abnormality is noted. IMPRESSION: No acute abnormality noted. Electronically Signed   By: Inez Catalina M.D.   On: 12/24/2018 12:58    ____________________________________________    PROCEDURES  Procedure(s) performed:    Procedures    Medications  Ampicillin-Sulbactam (UNASYN) 3 g in sodium chloride 0.9 % 100 mL IVPB (0 g Intravenous Stopped 12/24/18 1423)     ____________________________________________   INITIAL IMPRESSION / ASSESSMENT AND PLAN / ED COURSE  Pertinent labs & imaging results that were available during my care of the patient were reviewed by me and considered in my medical decision making (see chart for details).  Review of the [place] CSRS was performed in accordance of the [place] prior to dispensing any controlled drugs.     Patient's diagnosis is consistent with cat bite and cellulitis.  Vital signs are  reassuring.  No foreign body noted on x-ray.  Patient is afebrile.  Patient has a mildly increased white blood cell count of 13.  Patient was given a dose of IV Unasyn in the emergency department.  She will begin amoxicillin tonight.  She is agreeable to return to the emergency department in 24 hours for worsening erythema or swelling.  Skin pen was drawn around cellulitis and patient will return if erythema extends outside this line.  Patient has good follow-up.  Tetanus shot is up-to-date.  Cat is up-to-date with vaccinations.  Patient will be discharged home with prescriptions for Augmentin. Patient is to follow up with PCP as directed. Patient is given ED precautions to return to the ED for any worsening or new symptoms.     ____________________________________________  FINAL CLINICAL IMPRESSION(S) / ED DIAGNOSES  Final diagnoses:  Cat bite, initial encounter      NEW MEDICATIONS STARTED DURING THIS VISIT:  ED Discharge Orders         Ordered    amoxicillin-clavulanate (AUGMENTIN) 875-125 MG tablet  2 times daily     12/24/18 1437              This chart was dictated using voice recognition software/Dragon. Despite best efforts to proofread, errors can occur which can change the meaning. Any change was purely unintentional.    Laban Emperor, PA-C 12/24/18 1618    Nena Polio, MD 12/25/18 1400

## 2018-12-24 NOTE — Telephone Encounter (Signed)
Pt called with cat bites that happened on Tuesday. She has been cleaning the 2 areas and applying an antibiotic ointment to the area and wrapping them. But she thinks the areas are not getting any better.  They seem to be getting worst. Advised to go to an urgent care to be assessed. Pt stated she would go to [facility] for treatment. She stated also that she would have her cat put down because this is the second time that this has happened. Routing to flow at [facility] at [facility].  Reason for Disposition . [1] Puncture wound (hole through the skin) AND [2] from a cat bite (or deep claw puncture wound)  Answer Assessment - Initial Assessment Questions 1. ANIMAL: "What type of animal caused the bite?" "Is the injury from a bite or a claw?" If the animal is a dog or a cat, ask: "Was it a pet or a stray?" "Was it acting ill or behaving strangely?"     Cat at home 2. LOCATION: "Where is the bite located?"      Left hand and arm 3. SIZE: "How big is the bite?" "What does it look like?"      Keeps getting bigger, size of 50 cent piece 4. ONSET: "When did the bite happen?" (Minutes or hours ago)      Tuesday 5. CIRCUMSTANCES: "Tell me how this happened."       Cat was having a seizure 6. TETANUS: "When was the last tetanus booster?"     Last year 7. PREGNANCY: "Is there any chance you are pregnant?" "When was your last menstrual period?"     no  Protocols used: ANIMAL BITE-A-AH

## 2018-12-24 NOTE — Telephone Encounter (Signed)
This is just an [place]. I will follow up with her to be sure she goes to ED

## 2018-12-24 NOTE — ED Triage Notes (Signed)
Says her cat but ger,  Has swelling to hand.

## 2018-12-24 NOTE — Discharge Instructions (Addendum)
Please take a dose of Augmentin tonight.  Return to the emergency department for fever, worsening swelling, or if redness extends outside of the purple line.  If redness is improving, you can follow-up with primary care tomorrow or Monday.

## 2018-12-28 ENCOUNTER — Ambulatory Visit
Admission: RE | Admit: 2018-12-28 | Discharge: 2018-12-28 | Disposition: A | Discharge status: Home / Self Care | Payer: Self-pay | Source: Ambulatory Visit | Attending: Oncology | Admitting: Oncology

## 2018-12-28 ENCOUNTER — Other Ambulatory Visit: Payer: Self-pay

## 2018-12-28 ENCOUNTER — Ambulatory Visit: Payer: Self-pay | Attending: Oncology

## 2018-12-28 VITALS — BP 143/84 | HR 86 | Temp 98.5°F | Ht 64.5 in | Wt 229.6 lb

## 2018-12-28 DIAGNOSIS — R920 Mammographic microcalcification found on diagnostic imaging of breast: Secondary | ICD-10-CM

## 2018-12-28 DIAGNOSIS — Z0000 Encounter for general adult medical examination without abnormal findings: Secondary | ICD-10-CM

## 2018-12-28 NOTE — Progress Notes (Signed)
  Subjective:     Patient ID: Kathryn Friedman, female   DOB: [birth year], 63 y.o.   MRN: [id number]  HPI   Review of Systems     Objective:   Physical Exam Chest:     Breasts:        Right: No swelling, bleeding, inverted nipple, mass, nipple discharge, skin change or tenderness.        Left: No swelling, bleeding, inverted nipple, mass, nipple discharge, skin change or tenderness.        Assessment:     63 year old patient returns for 6 month follow-up and annual mammogram.  Left breast calcifications being followed for stability since October 2018.  Patient screened, and meets BCCCP eligibility.  Patient does not have insurance, Medicare or Medicaid.  Handout given on Affordable Care Act.  Instructed patient on breast self awareness using teach back method.  Clinical breast exam unremarkable.  No mass or lump palpated.  Patient was seen in ED on 12/24/2018 for a cat bite.  States she was treated with IV antibiotics.  She has been taking oral antibiotics since.  Redness has decreased per markings placed on arm in ED.  Encouraged patient to follow-up with primary physician as ED provider directed.  Risk Assessment    Risk Scores      12/28/2018   Last edited by: Orson Slick, CMA   5-year risk: 2 %   Lifetime risk: 9 %              Plan:  Sent for bilateral diagnostic mammogram and ultrasound.

## 2018-12-28 NOTE — Progress Notes (Addendum)
Phoned patient with Birads 3 result, and 1 year follow-up mammogram recommendation.  Scheduled for 12/29/2019 at 12:30.  Appointment information mailed to patient. Copy to [facility].

## 2018-12-30 ENCOUNTER — Encounter: Payer: Self-pay | Admitting: Internal Medicine

## 2018-12-31 ENCOUNTER — Telehealth: Payer: Self-pay | Admitting: Pharmacist

## 2018-12-31 NOTE — Telephone Encounter (Signed)
12/31/2018 12:15:27 PM - Victoza & tips for refills  12/31/2018 Printed [employer] refill request for Victoza Inject 1.8 mg once daily & tips-taking to Dr. Einar Pheasant @ [address] to sign.Delos Haring

## 2019-01-01 ENCOUNTER — Inpatient Hospital Stay: Payer: Self-pay | Admitting: Internal Medicine

## 2019-01-01 ENCOUNTER — Telehealth: Payer: Self-pay | Admitting: Internal Medicine

## 2019-01-01 NOTE — Telephone Encounter (Signed)
Medication management left a form for the dr. Haynes Hoehn is in color folder in front

## 2019-01-01 NOTE — Telephone Encounter (Signed)
Form placed up front and Anne Ng is aware

## 2019-01-01 NOTE — Telephone Encounter (Signed)
Form signed.

## 2019-01-08 ENCOUNTER — Telehealth: Payer: Self-pay | Admitting: Pharmacist

## 2019-01-08 NOTE — Telephone Encounter (Signed)
01/08/2019 10:28:31 AM - Victoza & tips refill  01/08/2019 Faxed [employer] refill request for Victoza Inject 1.8mg  once daily & Novofine 32Gtips. Kathryn Friedman

## 2019-01-11 ENCOUNTER — Other Ambulatory Visit: Payer: Self-pay | Admitting: Internal Medicine

## 2019-01-15 ENCOUNTER — Telehealth: Payer: Self-pay

## 2019-01-15 ENCOUNTER — Telehealth: Payer: Self-pay | Admitting: *Deleted

## 2019-01-15 DIAGNOSIS — Z122 Encounter for screening for malignant neoplasm of respiratory organs: Secondary | ICD-10-CM

## 2019-01-15 DIAGNOSIS — Z87891 Personal history of nicotine dependence: Secondary | ICD-10-CM

## 2019-01-15 NOTE — Telephone Encounter (Signed)
Call pt regarding lung screening. Left message for pt to return call.  

## 2019-01-15 NOTE — Telephone Encounter (Signed)
Patient has been notified that annual lung cancer screening low dose CT scan is due currently or will be in near future. Confirmed that patient is within the age range of 55-77, and asymptomatic, (no signs or symptoms of lung cancer). Patient denies illness that would prevent curative treatment for lung cancer if found. Verified smoking history, (current, 70.5 pack year). The shared decision making visit was done 08/13/16. Patient is agreeable for CT scan being scheduled.

## 2019-01-18 ENCOUNTER — Ambulatory Visit: Payer: Self-pay | Admitting: Internal Medicine

## 2019-01-18 ENCOUNTER — Telehealth: Payer: Self-pay | Admitting: Internal Medicine

## 2019-01-18 ENCOUNTER — Telehealth: Payer: Self-pay | Admitting: Pharmacist

## 2019-01-18 NOTE — Telephone Encounter (Signed)
01/18/2019 12:21:58 PM - Victoza & Tips enrollment renewal  01/18/2019 I had faxed 01/08/2019 a refill request to [employer] for refills on Victoza & tips--today I received a call from [place] stating patient enrollment has expired. Patient needs to re enroll-have printed application-mailing patient her portion to sign & return, also will take to provider Dr. Einar Pheasant to sign.Delos Haring

## 2019-01-19 ENCOUNTER — Telehealth: Payer: Self-pay | Admitting: Pharmacist

## 2019-01-19 NOTE — Telephone Encounter (Signed)
01/19/2019 1:55:45 PM - Advair 250/50 refill  01/19/2019 Placed refill online with [facility] for Advair 250/50, to ship 02/01/2019, order# [id number].Delos Haring

## 2019-01-20 ENCOUNTER — Encounter: Payer: Self-pay | Admitting: *Deleted

## 2019-01-21 ENCOUNTER — Other Ambulatory Visit: Payer: Self-pay | Admitting: Internal Medicine

## 2019-01-21 NOTE — Telephone Encounter (Signed)
Patient calling and states that the pharmacy is stating that they did not receive the amlodipine 5MG   prescription refill that was sent on 01/18/2019. Would like to know if it could be resent to the pharmacy? [facility]. [place], [place] [phone]

## 2019-01-21 NOTE — Telephone Encounter (Signed)
Call to pharmacy- they have just received Rx and are processing it now.

## 2019-01-25 ENCOUNTER — Ambulatory Visit
Admission: RE | Admit: 2019-01-25 | Discharge: 2019-01-25 | Disposition: A | Discharge status: Home / Self Care | Payer: Self-pay | Source: Ambulatory Visit | Attending: Nurse Practitioner | Admitting: Nurse Practitioner

## 2019-01-25 ENCOUNTER — Other Ambulatory Visit: Payer: Self-pay | Admitting: Internal Medicine

## 2019-01-25 DIAGNOSIS — Z87891 Personal history of nicotine dependence: Secondary | ICD-10-CM | POA: Insufficient documentation

## 2019-01-25 DIAGNOSIS — Z122 Encounter for screening for malignant neoplasm of respiratory organs: Secondary | ICD-10-CM | POA: Insufficient documentation

## 2019-01-26 ENCOUNTER — Encounter: Payer: Self-pay | Admitting: *Deleted

## 2019-01-26 NOTE — Telephone Encounter (Signed)
I sent flexeril #90 with no refills to medication management.  This was the pharmacy marked.  Please confirm this was correct place to send.  Thanks.

## 2019-01-26 NOTE — Telephone Encounter (Signed)
Last OV 09/15/2018   Last refilled 11/23/2018 disp 90 with 1 refill   Sent to PCP for approval

## 2019-01-27 ENCOUNTER — Other Ambulatory Visit: Payer: Self-pay | Admitting: Internal Medicine

## 2019-02-01 ENCOUNTER — Ambulatory Visit: Payer: Self-pay | Admitting: Internal Medicine

## 2019-02-03 ENCOUNTER — Telehealth: Payer: Self-pay | Admitting: Pharmacist

## 2019-02-03 NOTE — Telephone Encounter (Signed)
02/03/2019 10:59:33 AM - Victoza pens & tips-Novo Renewal  02/03/2019 Faxed [employer] renewal application for [employer] Inject 1.8mg  once daily & Novofine 32G tips use daily with Victoza pen. Delos Haring

## 2019-02-09 ENCOUNTER — Telehealth: Payer: Self-pay | Admitting: Pharmacist

## 2019-02-09 NOTE — Telephone Encounter (Signed)
02/09/2019 8:33:57 AM - Ventolin HFA refill  02/09/2019 Placed refill online with [facility] for Ventolin HFA, to ship 02/22/2019, order# [id number].Delos Haring

## 2019-02-10 ENCOUNTER — Encounter: Payer: Self-pay | Admitting: *Deleted

## 2019-02-22 ENCOUNTER — Other Ambulatory Visit: Payer: Self-pay | Admitting: Internal Medicine

## 2019-02-24 ENCOUNTER — Other Ambulatory Visit: Payer: Self-pay | Admitting: Internal Medicine

## 2019-02-26 MED ORDER — METFORMIN HCL ER 500 MG PO TB24: ORAL_TABLET | ORAL | 0 refills | Status: DC

## 2019-03-04 ENCOUNTER — Telehealth: Payer: Self-pay | Admitting: Pharmacy Technician

## 2019-03-04 ENCOUNTER — Telehealth: Payer: Self-pay | Admitting: Pharmacist

## 2019-03-04 NOTE — Telephone Encounter (Addendum)
03/03/19 BJK-Patient inquiring about when [facility] would receive Victoza.  [employer] and spoke with [employer]. Ilda Mori stated that a copy of my Government ID is required in order to provide information about the application.  She stated that this was a new policy from [employer] beginning November 25, 2018.  Told her that on page 6 of the PAP Application, the patient designated Elmer Picker and myself as her advocates, giving [place] permission to inquire about shipments and refills.  Ilda Mori stated that even though patient designated [place] as advocates, [employer] could not verify that they would be speaking to one of [place] without obtaining a copy of our Government ID.  I told her that I did not feel comfortable providing this information.  Asked to speak to her supervisor.  She told me that her supervisor was not available and she would have her supervisor reach out to me.     03/04/19  Spoke with Caryl Pina, supervisor for [employer].  Caryl Pina stated that Government ID was required in order for advocates to place refills or check on the status of shipments.  Explained that I do not feel comfortable about providing Government ID.  Caryl Pina told me that if I felt uncomfortable, because it listed my height and weight that they do not look at that information.  Tereso Newcomer that I didn't care about sharing that information.  I didn't want my Driver's License Number and DOB shared.  Suggested that [employer] assign ID numbers to their advocates like some of the other pharmaceutical companies.  The ID numbers are unique and the advocate's identity could be verified when the advocate provided this number.  Caryl Pina stated that [employer] would not be assigning advocate ID numbers.  Asked Caryl Pina if a signed statement from patient or provider giving Elmer Picker and I permission to refill medications and check on shipments would be acceptable.  Caryl Pina stated that documentation would not be acceptable.  Only form of  documentation that [employer] will accept is a Government ID.    Told Caryl Pina that I was calling from provider's office.  Caryl Pina stated that provider's license is tied to a different address than the address of our clinic.  Tereso Newcomer that we are an extension of the provider's office.  That we are a satellite office.  Caryl Pina asked if provider sees patients at our clinic and I stated no.  Caryl Pina stated that our office was not considered by [employer] as an extension of provider's office.  Therefore, a Government ID would be required by all advocates at [facility].  [facility]

## 2019-03-04 NOTE — Telephone Encounter (Signed)
03/04/2019 2:20:42 PM - Victoza & tips  03/04/2019 When Inez Catalina spoke with [employer] they also explained that the needles checked (Novofine Plus32G) did not match the script sent (Novofine tip 32G) I have made correction on page 5 of application and resending the complete application with income, and form that patient signed stating she gives permission to act as Advocate and she authorizes [place] to share the information needed to process the application. Hopefully they will process this application for shipment to our office.Delos Haring

## 2019-03-10 ENCOUNTER — Telehealth: Payer: Self-pay | Admitting: Internal Medicine

## 2019-03-10 NOTE — Telephone Encounter (Signed)
Patient called leaving a voicemail and stated that her atorvastatin dosage was changed by the provider but a new prescription has not been sent to medication management for the patient. Patient stated to call her if any questions on what is needing to be sent [phone] (mobile)

## 2019-03-11 ENCOUNTER — Other Ambulatory Visit: Payer: Self-pay

## 2019-03-11 MED ORDER — ATORVASTATIN CALCIUM 20 MG PO TABS: 20.0000 mg | ORAL_TABLET | Freq: Every evening | ORAL | 1 refills | Status: DC

## 2019-03-11 NOTE — Telephone Encounter (Signed)
rx sent in. Pt aware

## 2019-03-15 ENCOUNTER — Other Ambulatory Visit: Payer: Self-pay | Admitting: Internal Medicine

## 2019-03-16 ENCOUNTER — Encounter: Payer: Self-pay | Admitting: Internal Medicine

## 2019-03-17 NOTE — Telephone Encounter (Signed)
Called patient no answer ask, patient to please return call to office, to get more information and to schedule possible visit.

## 2019-03-18 NOTE — Telephone Encounter (Signed)
I do not mind doing a visit with her, but please make sure she is aware that the visit will be limited - unable to palpate abdomen, etc.  Agree with any worsening symptoms, need for evaluation.  Also recommend she make her GI MD aware of symptoms.  I was not sure if Juliann Pulse here this pm, so I sent back to [facility] and sent to you as well.

## 2019-03-18 NOTE — Telephone Encounter (Signed)
Patient has had left sides lower quadrant pain since Sunday, and now this morning has diarrhea has had 5-7 loose watery stools, her pain rating this morning is 4 on a scale of 0-10. Patient denies fever or chills , refuses UC agrees to Doxy.me with PCP scheduled for Friday. Advised patient to stay hydrated , drink plenty of fluids and if symptoms worsen she should be evaluated at [facility] or ER.

## 2019-03-18 NOTE — Telephone Encounter (Signed)
Patient is aware and says that she would go to ED if worsens. She is going to let GI know as well.

## 2019-03-19 ENCOUNTER — Ambulatory Visit (INDEPENDENT_AMBULATORY_CARE_PROVIDER_SITE_OTHER): Payer: Self-pay | Admitting: Internal Medicine

## 2019-03-19 ENCOUNTER — Telehealth: Payer: Self-pay

## 2019-03-19 ENCOUNTER — Other Ambulatory Visit: Payer: Self-pay

## 2019-03-19 ENCOUNTER — Encounter: Payer: Self-pay | Admitting: Internal Medicine

## 2019-03-19 DIAGNOSIS — I10 Essential (primary) hypertension: Secondary | ICD-10-CM

## 2019-03-19 DIAGNOSIS — E119 Type 2 diabetes mellitus without complications: Secondary | ICD-10-CM

## 2019-03-19 DIAGNOSIS — J449 Chronic obstructive pulmonary disease, unspecified: Secondary | ICD-10-CM

## 2019-03-19 DIAGNOSIS — R1030 Lower abdominal pain, unspecified: Secondary | ICD-10-CM

## 2019-03-19 NOTE — Telephone Encounter (Signed)
Pt has scheduled appt with me today.

## 2019-03-19 NOTE — Progress Notes (Addendum)
Patient ID: Kathryn Friedman, female   DOB: [birth year], 63 y.o.   MRN: [id number] Virtual Visit via Telephone Note  This visit type was conducted due to national recommendations for restrictions regarding the COVID-19 pandemic (e.g. social distancing).  This format is felt to be most appropriate for this patient at this time.  All issues noted in this document were discussed and addressed.  No physical exam was performed (except for noted visual exam findings with Video Visits).   I connected with [name] on 03/19/19 at 12:00 PM EDT by telephone and verified that I am speaking with the correct person using two identifiers. Location patient: home Location provider: work Persons participating in the virtual visit: patient, provider  I discussed the limitations, risks, security and privacy concerns of performing an evaluation and management service by telephone and the availability of in person appointments. The patient expressed understanding and agreed to proceed.   Reason for visit: acute visit.    HPI: She request appt to discuss abdominal discomfort.  She has had extensive GI work up.  Found to have an area of focal active colitis.  Was on budesonide - taper.  Also diagnosed with chronic gastritis.  States she was doing relatively well until earlier this week (approximately 5 days ago).  Developed left side pain that moved over her pelvic area.  Yesterday had 7 bowel movements from 3 am - 9am.  Took imodium.  Today has only had one bowel movement.  Eating ok.  No nausea or vomiting.  No blood in the stool.  Talked with GI yesterday.  They called in hyoscyamine.  She has not taken.  They also recommend a bland diet.  She states pain is better today.  No urine change.     ROS: See pertinent positives and negatives per HPI.  Past Medical History:  Diagnosis Date  . Anemia   . Arthritis    back and knees  . Asthma   . Chronic diarrhea   . COPD (chronic obstructive  pulmonary disease) ([place])   . Coronary artery disease    s/p stent placement 06/25/06  . Depression    secondary to the death of her husband (died 72)  . Diabetes mellitus without complication ([place]) Diagnosed 05/2008  . Diverticulitis   . Fatty infiltration of liver   . GERD (gastroesophageal reflux disease)   . Hypertension   . Hypertriglyceridemia   . Sleep apnea    on CPAP  . Spastic colon   . Tobacco abuse     Past Surgical History:  Procedure Laterality Date  . ABDOMINAL HYSTERECTOMY  with left ovary in place 1996  . APPENDECTOMY  1985  . BREAST BIOPSY Left 10/14/2017   calcs bx, fibrosis giant cell reaction and chronic inflammation, negative for malignancy.   Marland Kitchen CARDIAC CATHETERIZATION  2007   stents  . CESAREAN SECTION  1984  . CHOLECYSTECTOMY  1985  . COLONOSCOPY WITH PROPOFOL N/A 09/13/2016   Procedure: COLONOSCOPY WITH PROPOFOL;  Surgeon: Manya Silvas, MD;  [facility] ENDOSCOPY;  Service: Endoscopy;  Laterality: N/A;  . COLONOSCOPY WITH PROPOFOL N/A 11/09/2018   Procedure: COLONOSCOPY WITH PROPOFOL;  Surgeon: Manya Silvas, MD;  Location: [facility] ENDOSCOPY;  Service: Endoscopy;  Laterality: N/A;  . ESOPHAGOGASTRODUODENOSCOPY (EGD) WITH PROPOFOL N/A 02/02/2018   Procedure: ESOPHAGOGASTRODUODENOSCOPY (EGD) WITH PROPOFOL;  Surgeon: Manya Silvas, MD;  Location: [facility] ENDOSCOPY;  Service: Endoscopy;  Laterality: N/A;  . JOINT REPLACEMENT     bilateral knee replacements  .  KNEE ARTHROSCOPY  Arthroscopic left knee surgery   . KNEE SURGERY  status post knee surgey   . LEFT HEART CATH AND CORONARY ANGIOGRAPHY Left 05/14/2018   Procedure: LEFT HEART CATH AND CORONARY ANGIOGRAPHY;  Surgeon: Corey Skains, MD;  Location: [facility] CV LAB;  Service: Cardiovascular;  Laterality: Left;  . REPLACEMENT TOTAL KNEE  ([facility])  . SHOULDER SURGERY  shoulder operation secondary to a torn tendon    Family History  Problem Relation Age of Onset  . Other Mother        Hit  by a fire truck and has had multiple operations on her back , and has history of MVP   . Mitral valve prolapse Mother   . Lung cancer Mother   . Heart disease Father        myocardial infarction and is status post bypass surgery  . Mitral valve prolapse Sister   . Hepatitis C Brother   . Cirrhosis Brother   . Colon cancer Paternal Aunt     SOCIAL HX: reviewed.    Current Outpatient Medications:  .  hyoscyamine (LEVSIN SL) 0.125 MG SL tablet, Place under the tongue., Disp: , Rfl:  .  ADVAIR DISKUS 250-50 MCG/DOSE AEPB, INHALE 1 PUFF 2 TIMES A DAY. RINSE MOUTH AND SPIT AFTER EACH USE., Disp: 180 each, Rfl: 0 .  albuterol (VENTOLIN HFA) 108 (90 BASE) MCG/ACT inhaler, Inhale 2 puffs into the lungs every 4 (four) hours as needed. (Patient taking differently: Inhale 2 puffs into the lungs 4 (four) times daily. ), Disp: 18 g, Rfl: 0 .  amLODipine (NORVASC) 10 MG tablet, TAKE ONE TABLET BY MOUTH EVERY DAY, Disp: 90 tablet, Rfl: 2 .  amLODipine (NORVASC) 5 MG tablet, TAKE ONE TABLET BY MOUTH EVERY DAY, Disp: 90 tablet, Rfl: 1 .  aspirin 81 MG tablet, Take 81 mg by mouth daily. , Disp: , Rfl:  .  atorvastatin (LIPITOR) 20 MG tablet, Take 1 tablet (20 mg total) by mouth every evening., Disp: 90 tablet, Rfl: 1 .  Brexpiprazole (REXULTI) 2 MG TABS, Take 2 mg by mouth daily., Disp: , Rfl:  .  CALCIUM-VITAMIN D PO, Take 1 tablet by mouth daily. , Disp: , Rfl:  .  clopidogrel (PLAVIX) 75 MG tablet, TAKE ONE TABLET BY MOUTH EVERY DAY, Disp: 90 tablet, Rfl: 3 .  colestipol (COLESTID) 1 g tablet, Take 2 g by mouth daily. , Disp: , Rfl:  .  cyclobenzaprine (FLEXERIL) 10 MG tablet, TAKE ONE TABLET BY MOUTH 3 TIMES A DAY AS NEEDED FOR MUSCLE SPASMS, Disp: 90 tablet, Rfl: 0 .  DULoxetine (CYMBALTA) 60 MG capsule, Take 60 mg by mouth daily., Disp: , Rfl:  .  isosorbide mononitrate (IMDUR) 30 MG 24 hr tablet, Take by mouth., Disp: , Rfl:  .  liraglutide (VICTOZA) 18 MG/3ML SOPN, Inject 0.6 mg subcutaneously  daily for 1 week, then increase to 1.2 mg daily for 1 week, then increase to 1.8 mg daily thereafter. (Patient taking differently: Inject 1.8 mg into the skin daily. ), Disp: 1 pen, Rfl: 0 .  metFORMIN (GLUCOPHAGE-XR) 500 MG 24 hr tablet, TAKE TWO TABLETS (1000MG) BY MOUTH 2 TIMES A DAY, Disp: 180 tablet, Rfl: 0 .  mupirocin ointment (BACTROBAN) 2 %, Apply to affected area bid (Patient not taking: Reported on 05/08/2018), Disp: 22 g, Rfl: 0 .  nystatin (NYSTATIN) powder, APPLY TOPICALLY 2 TIMES A DAY, Disp: 15 g, Rfl: 0 .  nystatin cream (MYCOSTATIN), APPLY ONE APPLICATION TOPICALLY 2  TIMES A DAY (Patient taking differently: Apply 1 application topically twice daily as needed for yeast infection), Disp: 30 g, Rfl: 0 .  pantoprazole (PROTONIX) 40 MG tablet, TAKE ONE TABLET BY MOUTH EVERY DAY (Patient taking differently: Take 40 mg by mouth twice daily), Disp: 90 tablet, Rfl: 0 .  propranolol (INDERAL) 40 MG tablet, TAKE ONE TABLET BY MOUTH 2 TIMES A DAY, Disp: 180 tablet, Rfl: 3 .  sucralfate (CARAFATE) 1 g tablet, Take 1 g by mouth 2 (two) times daily before a meal. , Disp: , Rfl:  .  traZODone (DESYREL) 100 MG tablet, Take 200 mg by mouth at bedtime. , Disp: , Rfl:   EXAM:  GENERAL: alert.  Answering questions appropriately.  Sounds to be in no acute distress.    PSYCH/NEURO: pleasant and cooperative, no obvious depression or anxiety, speech and thought processing grossly intact  ASSESSMENT AND PLAN:  Discussed the following assessment and plan:  Chronic obstructive pulmonary disease, unspecified COPD type ([place])  Type 2 diabetes mellitus without complication, without long-term current use of insulin (H[facility])  Essential hypertension, benign  Lower abdominal pain  COPD (chronic obstructive pulmonary disease) Breathing stable.  Continue inhalers.    Diabetes Low carb diet and exercise.  Follow met b and a1c.    Essential hypertension, benign Blood pressure has been doing well.  Follow  pressures.  Follow metabolic panel.   Lower abdominal pain Lower abdominal/pelvic pain as outlined.  Had multiple loose stools yesterday.  Pain better today.  Only one stool today.  She spoke to GI.  Has hyoscyamine if needed.  Hold on making adjustments in her medication since improved.  F/u with GI.  Call with update.      I discussed the assessment and treatment plan with the patient. The patient was provided an opportunity to ask questions and all were answered. The patient agreed with the plan and demonstrated an understanding of the instructions.   The patient was advised to call back or seek an in-person evaluation if the symptoms worsen or if the condition fails to improve as anticipated.  I provided 15 minutes of non-face-to-face time during this encounter.   Einar Pheasant, MD

## 2019-03-19 NOTE — Telephone Encounter (Signed)
Copied from [place] [phone]. Topic: Quick Communication - See Telephone Encounter >> Mar 18, 2019  4:27 PM Nils Flack wrote: CRM for notification. See Telephone encounter for: 03/18/19. Tammi Klippel from [facility] called Pt has abdominal pain, pelvic pain, chronic diarrhea that have may worsened.  [name] is her provider at [facility], but the office will be closed tomorrow due to Covid 19 and staffing.  She is giving her cell number to Dr Nicki Reaper since pt has appt with Dr Nicki Reaper tomorrow.  Please have Dr Nicki Reaper call her tomorrow to discuss pt and her symptoms   Cell phone for Tammi Klippel [phone]

## 2019-03-19 NOTE — Telephone Encounter (Signed)
Spoke to [employer], [place] this morning from [employer].   She said she received phone call from pt after clinic hours on yesterday for abdominal pain and diarrhea.  She gave pt home care instructions and ED precautions. She is aware that patient has an appt today w/ Dr. Nicki Reaper and wanted her to know that she is available for consult by phone if needed.  Cell phone number for Tammi Klippel:  [phone].

## 2019-03-21 ENCOUNTER — Encounter: Payer: Self-pay | Admitting: Internal Medicine

## 2019-03-21 DIAGNOSIS — R1030 Lower abdominal pain, unspecified: Secondary | ICD-10-CM | POA: Insufficient documentation

## 2019-03-21 NOTE — Assessment & Plan Note (Signed)
Low carb diet and exercise.  Follow met b and a1c.   

## 2019-03-21 NOTE — Assessment & Plan Note (Signed)
Lower abdominal/pelvic pain as outlined.  Had multiple loose stools yesterday.  Pain better today.  Only one stool today.  She spoke to GI.  Has hyoscyamine if needed.  Hold on making adjustments in her medication since improved.  F/u with GI.  Call with update.

## 2019-03-21 NOTE — Assessment & Plan Note (Signed)
Blood pressure has been doing well.  Follow pressures.  Follow metabolic panel.   

## 2019-03-21 NOTE — Assessment & Plan Note (Signed)
Breathing stable.  Continue inhalers.   

## 2019-04-07 ENCOUNTER — Telehealth: Payer: Self-pay | Admitting: Pharmacist

## 2019-04-07 NOTE — Telephone Encounter (Signed)
04/07/2019 1:49:08 PM - Advair 250/50 refill  04/07/2019 Placed refill online with [facility] for Advair 250/50 to ship 04/21/2019, order# [id number].Delos Haring

## 2019-04-08 ENCOUNTER — Other Ambulatory Visit: Payer: Self-pay | Admitting: Internal Medicine

## 2019-04-08 MED ORDER — PANTOPRAZOLE SODIUM 40 MG PO TBEC: 40.0000 mg | DELAYED_RELEASE_TABLET | Freq: Every day | ORAL | 1 refills | Status: DC

## 2019-04-08 NOTE — Telephone Encounter (Signed)
rx refilled.

## 2019-04-08 NOTE — Telephone Encounter (Signed)
Copied from [place] [place] [phone]. Topic: Quick Communication - Rx Refill/Question >> Apr 08, 2019 11:50 AM Antonieta Iba C wrote: Medication: pantoprazole (PROTONIX) 40 MG tablet   Has the patient contacted their pharmacy? Yes- has been faxed twice.    (Agent: If no, request that the patient contact the pharmacy for the refill.) (Agent: If yes, when and what did the pharmacy advise?)  Preferred Pharmacy (with phone number or street name): [facility] [place], [place] [address]  Agent: Please be advised that RX refills may take up to 3 business days. We ask that you follow-up with your pharmacy.

## 2019-04-12 ENCOUNTER — Encounter: Payer: Self-pay | Admitting: Internal Medicine

## 2019-04-12 ENCOUNTER — Ambulatory Visit (INDEPENDENT_AMBULATORY_CARE_PROVIDER_SITE_OTHER): Payer: Self-pay | Admitting: Internal Medicine

## 2019-04-12 ENCOUNTER — Other Ambulatory Visit: Payer: Self-pay

## 2019-04-12 DIAGNOSIS — J449 Chronic obstructive pulmonary disease, unspecified: Secondary | ICD-10-CM

## 2019-04-12 DIAGNOSIS — K219 Gastro-esophageal reflux disease without esophagitis: Secondary | ICD-10-CM

## 2019-04-12 DIAGNOSIS — I10 Essential (primary) hypertension: Secondary | ICD-10-CM

## 2019-04-12 DIAGNOSIS — I2510 Atherosclerotic heart disease of native coronary artery without angina pectoris: Secondary | ICD-10-CM

## 2019-04-12 DIAGNOSIS — G4733 Obstructive sleep apnea (adult) (pediatric): Secondary | ICD-10-CM

## 2019-04-12 DIAGNOSIS — J849 Interstitial pulmonary disease, unspecified: Secondary | ICD-10-CM

## 2019-04-12 DIAGNOSIS — E1159 Type 2 diabetes mellitus with other circulatory complications: Secondary | ICD-10-CM

## 2019-04-12 DIAGNOSIS — Z87891 Personal history of nicotine dependence: Secondary | ICD-10-CM

## 2019-04-12 DIAGNOSIS — D649 Anemia, unspecified: Secondary | ICD-10-CM

## 2019-04-12 DIAGNOSIS — D509 Iron deficiency anemia, unspecified: Secondary | ICD-10-CM

## 2019-04-12 DIAGNOSIS — E7800 Pure hypercholesterolemia, unspecified: Secondary | ICD-10-CM

## 2019-04-12 MED ORDER — METFORMIN HCL ER 500 MG PO TB24: ORAL_TABLET | ORAL | 1 refills | Status: DC

## 2019-04-12 NOTE — Progress Notes (Signed)
Patient ID: Kathryn Friedman, female   DOB: January 17, 1956, 63 y.o.   MRN: [id number]   Virtual Visit via telephone Note  This visit type was conducted due to national recommendations for restrictions regarding the COVID-19 pandemic (e.g. social distancing).  This format is felt to be most appropriate for this patient at this time.  All issues noted in this document were discussed and addressed.  No physical exam was performed (except for noted visual exam findings with Video Visits).   I connected with [name] by telephone and verified that I am speaking with the correct person using two identifiers. Location patient: home Location provider: work  Persons participating in the telephone visit: patient, provider  I discussed the limitations, risks, security and privacy concerns of performing an evaluation and management service by telephone and the availability of in person appointments. The patient expressed understanding and agreed to proceed.   Reason for visit: scheduled follow up.    HPI: She reports she is doing relatively well.  Her GI issues are better.  The previous pain (left side) - better.  Saw GI.  Given rx for hyoscyamine.  Has not had to use.  On probiotic.  Feels she is doing well on the probiotic.  No chest pain.  Breathing stable.  No sob.  No chest congestion or cough.  No known COVID exposure. No abdominal pain.  Has eye check tomorrow.  Followed by Dr Cleda Mccreedy for foot exams.  Still smoking - 1 ppd.  Discussed the need to quit smoking.  She desires not to stop at this time.  Blood pressure elevated on her initial check with her wrist cuff.  She checked with her husband's arm cuff - 146/82.  Has been under good control here in the office.  Discussed blood pressure check when she comes in for labs.  No headache or dizziness.     ROS: See pertinent positives and negatives per HPI.  Past Medical History:  Diagnosis Date   Anemia    Arthritis    back and  knees   Asthma    Chronic diarrhea    COPD (chronic obstructive pulmonary disease) (HCC)    Coronary artery disease    s/p stent placement 06/25/06   Depression    secondary to the death of her husband (died 51)   Diabetes mellitus without complication ([place]) Diagnosed 05/2008   Diverticulitis    Fatty infiltration of liver    GERD (gastroesophageal reflux disease)    Hypertension    Hypertriglyceridemia    Sleep apnea    on CPAP   Spastic colon    Tobacco abuse     Past Surgical History:  Procedure Laterality Date   ABDOMINAL HYSTERECTOMY  with left ovary in place [place] Left 10/14/2017   calcs bx, fibrosis giant cell reaction and chronic inflammation, negative for malignancy.    CARDIAC CATHETERIZATION  2007   stents   [place]   COLONOSCOPY WITH PROPOFOL N/A 09/13/2016   Procedure: COLONOSCOPY WITH PROPOFOL;  Surgeon: Manya Silvas, MD;  Location: [facility] ENDOSCOPY;  Service: Endoscopy;  Laterality: N/A;   COLONOSCOPY WITH PROPOFOL N/A 11/09/2018   Procedure: COLONOSCOPY WITH PROPOFOL;  Surgeon: Manya Silvas, MD;  Location: [facility] ENDOSCOPY;  Service: Endoscopy;  Laterality: N/A;   ESOPHAGOGASTRODUODENOSCOPY (EGD) WITH PROPOFOL N/A 02/02/2018   Procedure: ESOPHAGOGASTRODUODENOSCOPY (EGD) WITH PROPOFOL;  Surgeon: Manya Silvas, MD;  Location:  [facility] ENDOSCOPY;  Service: Endoscopy;  Laterality: N/A;   JOINT REPLACEMENT     bilateral knee replacements   KNEE ARTHROSCOPY  Arthroscopic left knee surgery    KNEE SURGERY  status post knee surgey    LEFT HEART CATH AND CORONARY ANGIOGRAPHY Left 05/14/2018   Procedure: LEFT HEART CATH AND CORONARY ANGIOGRAPHY;  Surgeon: Corey Skains, MD;  Location: [facility] CV LAB;  Service: Cardiovascular;  Laterality: Left;   REPLACEMENT TOTAL KNEE  ([facility])   SHOULDER SURGERY  shoulder operation secondary to a torn tendon    Family  History  Problem Relation Age of Onset   Other Mother        Hit by a fire truck and has had multiple operations on her back , and has history of MVP    Mitral valve prolapse Mother    Lung cancer Mother    Heart disease Father        myocardial infarction and is status post bypass surgery   Mitral valve prolapse Sister    Hepatitis C Brother    Cirrhosis Brother    Colon cancer Paternal Aunt     SOCIAL HX: reviewed.    Current Outpatient Medications:    ADVAIR DISKUS 250-50 MCG/DOSE AEPB, INHALE 1 PUFF 2 TIMES A DAY. RINSE MOUTH AND SPIT AFTER EACH USE., Disp: 180 each, Rfl: 0   albuterol (VENTOLIN HFA) 108 (90 BASE) MCG/ACT inhaler, Inhale 2 puffs into the lungs every 4 (four) hours as needed. (Patient taking differently: Inhale 2 puffs into the lungs 4 (four) times daily. ), Disp: 18 g, Rfl: 0   amLODipine (NORVASC) 5 MG tablet, TAKE ONE TABLET BY MOUTH EVERY DAY, Disp: 90 tablet, Rfl: 1   aspirin 81 MG tablet, Take 81 mg by mouth daily. , Disp: , Rfl:    atorvastatin (LIPITOR) 20 MG tablet, Take 1 tablet (20 mg total) by mouth every evening., Disp: 90 tablet, Rfl: 1   Brexpiprazole (REXULTI) 2 MG TABS, Take 2 mg by mouth daily., Disp: , Rfl:    CALCIUM-VITAMIN D PO, Take 1 tablet by mouth daily. , Disp: , Rfl:    clopidogrel (PLAVIX) 75 MG tablet, TAKE ONE TABLET BY MOUTH EVERY DAY, Disp: 90 tablet, Rfl: 3   colestipol (COLESTID) 1 g tablet, Take 2 g by mouth daily. , Disp: , Rfl:    cyclobenzaprine (FLEXERIL) 10 MG tablet, TAKE ONE TABLET BY MOUTH 3 TIMES A DAY AS NEEDED FOR MUSCLE SPASMS, Disp: 90 tablet, Rfl: 0   DULoxetine (CYMBALTA) 60 MG capsule, Take 60 mg by mouth daily., Disp: , Rfl:    hyoscyamine (LEVSIN SL) 0.125 MG SL tablet, Place under the tongue., Disp: , Rfl:    isosorbide mononitrate (IMDUR) 30 MG 24 hr tablet, Take by mouth., Disp: , Rfl:    liraglutide (VICTOZA) 18 MG/3ML SOPN, Inject 0.6 mg subcutaneously daily for 1 week, then increase to  1.2 mg daily for 1 week, then increase to 1.8 mg daily thereafter. (Patient taking differently: Inject 1.8 mg into the skin daily. ), Disp: 1 pen, Rfl: 0   metFORMIN (GLUCOPHAGE-XR) 500 MG 24 hr tablet, TAKE TWO TABLETS (1000MG) BY MOUTH 2 TIMES A DAY, Disp: 360 tablet, Rfl: 1   mupirocin ointment (BACTROBAN) 2 %, Apply to affected area bid (Patient not taking: Reported on 05/08/2018), Disp: 22 g, Rfl: 0   nystatin (NYSTATIN) powder, APPLY TOPICALLY 2 TIMES A DAY, Disp: 15 g, Rfl: 0   nystatin cream (MYCOSTATIN), APPLY ONE APPLICATION  TOPICALLY 2 TIMES A DAY (Patient taking differently: Apply 1 application topically twice daily as needed for yeast infection), Disp: 30 g, Rfl: 0   pantoprazole (PROTONIX) 40 MG tablet, Take 1 tablet (40 mg total) by mouth daily., Disp: 90 tablet, Rfl: 1   propranolol (INDERAL) 40 MG tablet, TAKE ONE TABLET BY MOUTH 2 TIMES A DAY, Disp: 180 tablet, Rfl: 3   sucralfate (CARAFATE) 1 g tablet, Take 1 g by mouth 2 (two) times daily before a meal. , Disp: , Rfl:    traZODone (DESYREL) 100 MG tablet, Take 200 mg by mouth at bedtime. , Disp: , Rfl:   EXAM:  VITALS per patient if applicable: 015/61  GENERAL: alert.  Sounds to be in no acute distress.  Answering questions appropriately.    PSYCH/NEURO: pleasant and cooperative, no obvious depression or anxiety, speech and thought processing grossly intact  ASSESSMENT AND PLAN:  Discussed the following assessment and plan:  Anemia, unspecified type  Coronary artery disease involving native coronary artery of native heart without angina pectoris  Chronic obstructive pulmonary disease, unspecified COPD type ([place])  Type 2 diabetes mellitus with other circulatory complication, without long-term current use of insulin (HCC)  Essential hypertension, benign  Gastroesophageal reflux disease, esophagitis presence not specified  Hypercholesterolemia  ILD (interstitial lung disease) (HCC)  Iron deficiency  anemia, unspecified iron deficiency anemia type  Obstructive sleep apnea  Personal history of tobacco use, presenting hazards to health  Anemia Follow cbc.   CAD (coronary artery disease) S/p stent placement.  Has been followed by cardiology.  Continue risk factor modification.    COPD (chronic obstructive pulmonary disease) Breathing stable.  Continue inhalers.    Diabetes On metformin.  States sugar doing well.  114 on recent check. Not checking regularly.  Low carb diet and exercise. Follow met b and a1c.    Essential hypertension, benign Blood pressure as outlined.  Has been under good control on checks here in the office.  Elevated with her husband's cuff.  Will have her pressure checked this week when comes in for labs.  Hold on making changes at this time.  Follow pressures.  Follow metabolic panel.   GERD (gastroesophageal reflux disease) Controlled on protonix.    Hypercholesterolemia On lipitor.  Low cholesterol diet and exercise.  Follow lipid panel and liver function tests.    ILD (interstitial lung disease) (HCC) Breathing stable.   Iron deficiency anemia Follow cbc.   Obstructive sleep apnea CPAP.   Personal history of tobacco use, presenting hazards to health Discussed the need to stop smoking.  She desires not to stop at this time.  Follow.      I discussed the assessment and treatment plan with the patient. The patient was provided an opportunity to ask questions and all were answered. The patient agreed with the plan and demonstrated an understanding of the instructions.   The patient was advised to call back or seek an in-person evaluation if the symptoms worsen or if the condition fails to improve as anticipated.  I provided 21 minutes of non-face-to-face time during this encounter.   Einar Pheasant, MD

## 2019-04-14 ENCOUNTER — Telehealth: Payer: Self-pay | Admitting: *Deleted

## 2019-04-14 DIAGNOSIS — D509 Iron deficiency anemia, unspecified: Secondary | ICD-10-CM

## 2019-04-14 DIAGNOSIS — E7800 Pure hypercholesterolemia, unspecified: Secondary | ICD-10-CM

## 2019-04-14 DIAGNOSIS — I10 Essential (primary) hypertension: Secondary | ICD-10-CM

## 2019-04-14 DIAGNOSIS — D649 Anemia, unspecified: Secondary | ICD-10-CM

## 2019-04-14 DIAGNOSIS — E119 Type 2 diabetes mellitus without complications: Secondary | ICD-10-CM

## 2019-04-14 NOTE — Telephone Encounter (Signed)
Please place future orders for lab appt on 5/21

## 2019-04-14 NOTE — Telephone Encounter (Signed)
Labs ordered.

## 2019-04-15 ENCOUNTER — Other Ambulatory Visit: Payer: Self-pay

## 2019-04-17 ENCOUNTER — Encounter: Payer: Self-pay | Admitting: Internal Medicine

## 2019-04-17 NOTE — Assessment & Plan Note (Signed)
Follow cbc.  

## 2019-04-17 NOTE — Assessment & Plan Note (Signed)
S/p stent placement.  Has been followed by cardiology.  Continue risk factor modification.

## 2019-04-17 NOTE — Assessment & Plan Note (Signed)
Controlled on protonix.   

## 2019-04-17 NOTE — Assessment & Plan Note (Signed)
Breathing stable.

## 2019-04-17 NOTE — Assessment & Plan Note (Signed)
Blood pressure as outlined.  Has been under good control on checks here in the office.  Elevated with her husband's cuff.  Will have her pressure checked this week when comes in for labs.  Hold on making changes at this time.  Follow pressures.  Follow metabolic panel.

## 2019-04-17 NOTE — Assessment & Plan Note (Signed)
On lipitor.  Low cholesterol diet and exercise.  Follow lipid panel and liver function tests.   

## 2019-04-17 NOTE — Assessment & Plan Note (Signed)
CPAP.  

## 2019-04-17 NOTE — Assessment & Plan Note (Signed)
On metformin.  States sugar doing well.  114 on recent check. Not checking regularly.  Low carb diet and exercise. Follow met b and a1c.

## 2019-04-17 NOTE — Assessment & Plan Note (Signed)
Breathing stable.  Continue inhalers.   

## 2019-04-17 NOTE — Assessment & Plan Note (Signed)
Discussed the need to stop smoking.  She desires not to stop at this time.  Follow.

## 2019-04-21 ENCOUNTER — Ambulatory Visit: Payer: Self-pay

## 2019-04-21 ENCOUNTER — Other Ambulatory Visit: Payer: Self-pay

## 2019-04-26 ENCOUNTER — Other Ambulatory Visit: Payer: Self-pay | Admitting: Internal Medicine

## 2019-04-26 NOTE — Telephone Encounter (Signed)
Patient says she is supposed to take 2 Tablets per day of pantaprazole, morning and night. Please update script or follow up with patient to discuss if needed.   Please notify patient once it's updated and leave vmail if no answer.

## 2019-04-27 NOTE — Telephone Encounter (Signed)
rx ok'd for protonix bid and flexeril.

## 2019-04-28 ENCOUNTER — Other Ambulatory Visit (INDEPENDENT_AMBULATORY_CARE_PROVIDER_SITE_OTHER): Payer: Self-pay

## 2019-04-28 ENCOUNTER — Other Ambulatory Visit: Payer: Self-pay

## 2019-04-28 ENCOUNTER — Ambulatory Visit (INDEPENDENT_AMBULATORY_CARE_PROVIDER_SITE_OTHER): Payer: Self-pay | Admitting: *Deleted

## 2019-04-28 VITALS — BP 140/80 | HR 90

## 2019-04-28 DIAGNOSIS — I10 Essential (primary) hypertension: Secondary | ICD-10-CM

## 2019-04-28 DIAGNOSIS — E7800 Pure hypercholesterolemia, unspecified: Secondary | ICD-10-CM

## 2019-04-28 DIAGNOSIS — E119 Type 2 diabetes mellitus without complications: Secondary | ICD-10-CM

## 2019-04-28 DIAGNOSIS — D649 Anemia, unspecified: Secondary | ICD-10-CM

## 2019-04-28 LAB — CBC WITH DIFFERENTIAL/PLATELET
Basophils Absolute: 0 10*3/uL (ref 0.0–0.1)
Basophils Relative: 0.3 % (ref 0.0–3.0)
Eosinophils Absolute: 0.1 10*3/uL (ref 0.0–0.7)
Eosinophils Relative: 1.6 % (ref 0.0–5.0)
HCT: 40.4 % (ref 36.0–46.0)
Hemoglobin: 13.9 g/dL (ref 12.0–15.0)
Lymphocytes Relative: 18.9 % (ref 12.0–46.0)
Lymphs Abs: 1.6 10*3/uL (ref 0.7–4.0)
MCHC: 34.4 g/dL (ref 30.0–36.0)
MCV: 89.4 fl (ref 78.0–100.0)
Monocytes Absolute: 0.3 10*3/uL (ref 0.1–1.0)
Monocytes Relative: 3.9 % (ref 3.0–12.0)
Neutro Abs: 6.4 10*3/uL (ref 1.4–7.7)
Neutrophils Relative %: 75.3 % (ref 43.0–77.0)
Platelets: 181 10*3/uL (ref 150.0–400.0)
RBC: 4.52 Mil/uL (ref 3.87–5.11)
RDW: 18.5 % — ABNORMAL HIGH (ref 11.5–15.5)
WBC: 8.6 10*3/uL (ref 4.0–10.5)

## 2019-04-28 LAB — HEMOGLOBIN A1C: Hgb A1c MFr Bld: 6.7 % — ABNORMAL HIGH (ref 4.6–6.5)

## 2019-04-28 LAB — BASIC METABOLIC PANEL
BUN: 6 mg/dL (ref 6–23)
CO2: 27 mEq/L (ref 19–32)
Calcium: 9.2 mg/dL (ref 8.4–10.5)
Chloride: 101 mEq/L (ref 96–112)
Creatinine, Ser: 0.62 mg/dL (ref 0.40–1.20)
GFR: 97.18 mL/min (ref >60.00)
Glucose, Bld: 113 mg/dL — ABNORMAL HIGH (ref 70–99)
Potassium: 3.9 mEq/L (ref 3.5–5.1)
Sodium: 136 mEq/L (ref 135–145)

## 2019-04-28 LAB — LIPID PANEL
Cholesterol: 120 mg/dL (ref 0–200)
HDL: 40.1 mg/dL (ref >39.00)
LDL Cholesterol: 56 mg/dL (ref 0–99)
NonHDL: 79.66
Total CHOL/HDL Ratio: 3
Triglycerides: 118 mg/dL (ref 0.0–149.0)
VLDL: 23.6 mg/dL (ref 0.0–40.0)

## 2019-04-28 LAB — HEPATIC FUNCTION PANEL
ALT: 20 U/L (ref 0–35)
AST: 19 U/L (ref 0–37)
Albumin: 3.7 g/dL (ref 3.5–5.2)
Alkaline Phosphatase: 52 U/L (ref 39–117)
Bilirubin, Direct: 0.1 mg/dL (ref 0.0–0.3)
Total Bilirubin: 0.4 mg/dL (ref 0.2–1.2)
Total Protein: 6.5 g/dL (ref 6.0–8.3)

## 2019-04-28 LAB — FERRITIN: Ferritin: 39.8 ng/mL (ref 10.0–291.0)

## 2019-04-28 NOTE — Progress Notes (Signed)
Confirm no problems with lower extremity swelling.  If no and given that her blood pressure on recheck is still a little elevated (140s), I would like to increase amlodipine to 5mg  bid (or 10mg  q day if pt desires).  Will need to follow blood pressure and adjust medication more if needed.

## 2019-04-28 NOTE — Progress Notes (Signed)
Patient here for nurse visit BP check per order from See last Office note.  Patient reports compliance with prescribed BP medications: yes, had not taken meds this morning due to lab work. Patient attained BP on home cuff of 201/114 pulse 90. Bp Taken by nurse 160/86 pulse 87 waited 20 mnutes BP attained 140/80 pulse 87  Last dose of BP medication:   BP Readings from Last 3 Encounters:  04/28/19 (!) 160/86  12/28/18 (!) 143/84  12/24/18 (!) 146/60   Pulse Readings from Last 3 Encounters:  04/28/19 87  12/28/18 86  12/24/18 79      Patient verbalized understanding of instructions.   Kerin Salen, RN

## 2019-04-29 ENCOUNTER — Encounter: Payer: Self-pay | Admitting: Internal Medicine

## 2019-04-29 ENCOUNTER — Other Ambulatory Visit: Payer: Self-pay

## 2019-04-29 MED ORDER — AMLODIPINE BESYLATE 10 MG PO TABS: 5.0000 mg | ORAL_TABLET | Freq: Every day | ORAL | 0 refills | Status: DC

## 2019-04-29 NOTE — Telephone Encounter (Signed)
Left message to call back  

## 2019-04-29 NOTE — Progress Notes (Signed)
Spoke with patient. Confirmed no lower extremity swelling. Patient aware to increase amlodipine. Sent in new rx with increased dose.

## 2019-04-29 NOTE — Telephone Encounter (Signed)
See cc'd chart - message.  Please call pt and notify her of change in medication.  Will need new rx sent in.

## 2019-04-29 NOTE — Telephone Encounter (Signed)
Increased amlodipine. Pt aware. See chart notes for further documentation

## 2019-05-03 ENCOUNTER — Other Ambulatory Visit: Payer: Self-pay | Admitting: Internal Medicine

## 2019-05-06 ENCOUNTER — Telehealth: Payer: Self-pay

## 2019-05-06 ENCOUNTER — Ambulatory Visit: Payer: Self-pay | Admitting: Internal Medicine

## 2019-05-06 NOTE — Telephone Encounter (Signed)
Given persistent increased in blood pressure, will need to add another medication.  Need to confirm if she has ever had an allergic reaction to any blood pressure medication or had any problems with blood pressure medication causing cough, etc.  If she wants a visit to discuss, can schedule, otherwise, let me know and I will send in rx.

## 2019-05-06 NOTE — Telephone Encounter (Signed)
Pt. Reports she increased her Amlodipine last week as ordered. States at first her BP readings had come down to 681'L systolic. This week they have come back up. Does have a headache today and some "dizziness that comes and goes." Please advise pt.  Answer Assessment - Initial Assessment Questions 1. BLOOD PRESSURE: "What is the blood pressure?" "Did you take at least two measurements 5 minutes apart?"     174/108 right      157/108  Left arm 2. ONSET: "When did you take your blood pressure?"     0700 today 3. HOW: "How did you obtain the blood pressure?" (e.g., visiting nurse, automatic home BP monitor)     Home monitor 4. HISTORY: "Do you have a history of high blood pressure?"     Yes 5. MEDICATIONS: "Are you taking any medications for blood pressure?" "Have you missed any doses recently?"     No missed doses 6. OTHER SYMPTOMS: "Do you have any symptoms?" (e.g., headache, chest pain, blurred vision, difficulty breathing, weakness)     Headache, dizziness off and on 7. PREGNANCY: "Is there any chance you are pregnant?" "When was your last menstrual period?"     No  Protocols used: HIGH BLOOD PRESSURE-A-AH

## 2019-05-06 NOTE — Telephone Encounter (Signed)
Called patient. She was not at home to give me BP readings right now and call back to give them to me

## 2019-05-06 NOTE — Telephone Encounter (Signed)
This is duplicate see other message

## 2019-05-06 NOTE — Telephone Encounter (Signed)
Pt would like to do a virtual visit to discuss. Do you want to work her in somewhere before the weekend? Wasn't sure if pt should wait until Tuesday

## 2019-05-06 NOTE — Telephone Encounter (Signed)
Spoke with patient to confirm symptoms. Patient is complaining of dull head ache that she has had for a few days and intermittent dizziness. She does not get dizzy with change of position. NO sob, vomiting, nausea, chest pain, swelling, etc. Her amlodipine was increased to 10 mg q day on 6/4. She takes 5 mg BID. BP was trending down and has now started trending back up. Patient just got a new BP cuff. Has not missed any doses of her medication. Stated over all she feels ok, the main reason she called is because she noticed the numbers trending back up.

## 2019-05-06 NOTE — Telephone Encounter (Signed)
146/81 145/80 135/83 138/77 157/91, 164/136 157/108, 174/107 156/87  These are her BP readings for the last week. She got a new cuff after NV in office

## 2019-05-06 NOTE — Telephone Encounter (Signed)
How are her blood pressures running.  Any numbers.  (her previous cuff was way off - reading high).  Her husband's cuff was more accurate.  Sounds like she has a new cuff.  Need readings of how high she has been running, so know if need to adjust medication.  If acute issues, worsening headache, dizziness, etc - will need to be evaluated.

## 2019-05-06 NOTE — Telephone Encounter (Signed)
Copied from [place] [id number]. Topic: Quick Communication - See Telephone Encounter >> May 06, 2019  9:08 AM Berneta Levins wrote: CRM for notification. See Telephone encounter for: 05/06/19.  Pt states she was supposed to call back with blood pressure readings: 04/30/2019:  BP 146/81 Pulse 92 05/01/2019:  BP 146/81 Pulse 81 05/02/2019:  BP 145/80 Pulse 91 05/03/2019:  BP 135/83 Pulse 95 05/04/2019:  BP 138/77 Pulse 93 05/05/2019:  BP right arm 157/91 Pulse 86 (done first about 30 minutes before left arm reading)                       BP left arm 164/136 Pulse 93 05/06/2019:  BP left arm 157/108 Pulse 89 (done first about 1 hour prior to right arm reading)                       BP right arm 174/108 Pulse 88  Call was transferred to Nurse Triage.

## 2019-05-06 NOTE — Telephone Encounter (Signed)
Pt scheduled  

## 2019-05-06 NOTE — Telephone Encounter (Signed)
Yes.  See if she can do 4:30 tomorrow.

## 2019-05-07 ENCOUNTER — Ambulatory Visit (INDEPENDENT_AMBULATORY_CARE_PROVIDER_SITE_OTHER): Payer: Self-pay | Admitting: Internal Medicine

## 2019-05-07 ENCOUNTER — Other Ambulatory Visit: Payer: Self-pay

## 2019-05-07 DIAGNOSIS — Z8669 Personal history of other diseases of the nervous system and sense organs: Secondary | ICD-10-CM

## 2019-05-07 DIAGNOSIS — J449 Chronic obstructive pulmonary disease, unspecified: Secondary | ICD-10-CM

## 2019-05-07 DIAGNOSIS — E1159 Type 2 diabetes mellitus with other circulatory complications: Secondary | ICD-10-CM

## 2019-05-07 DIAGNOSIS — I2510 Atherosclerotic heart disease of native coronary artery without angina pectoris: Secondary | ICD-10-CM

## 2019-05-07 DIAGNOSIS — E7800 Pure hypercholesterolemia, unspecified: Secondary | ICD-10-CM

## 2019-05-07 DIAGNOSIS — I10 Essential (primary) hypertension: Secondary | ICD-10-CM

## 2019-05-07 MED ORDER — TELMISARTAN 20 MG PO TABS: 20.0000 mg | ORAL_TABLET | Freq: Every day | ORAL | 0 refills | Status: DC

## 2019-05-07 NOTE — Progress Notes (Signed)
Patient ID: Kathryn Friedman, female   DOB: 10/08/1956, 63 y.o.   MRN: [id number]   Virtual Visit via telephone Note  This visit type was conducted due to national recommendations for restrictions regarding the COVID-19 pandemic (e.g. social distancing).  This format is felt to be most appropriate for this patient at this time.  All issues noted in this document were discussed and addressed.  No physical exam was performed (except for noted visual exam findings with Video Visits).   I connected with Kathryn Friedman by telephone and verified that I am speaking with the correct person using two identifiers. Location patient: home Location provider: work Persons participating in the virtual visit: patient, provider  I discussed the limitations, risks, security and privacy concerns of performing an evaluation and management service by telephone and the availability of in person appointments. The patient expressed understanding and agreed to proceed.   Reason for visit: acute visit.    HPI: She reports she has been doing relatively well.  Blood pressure has been elevated.  Recently increased amlodipine to 54m per day. Blood pressure was doing well at first.  Recent checks - 144/74 and 161/87.  No chest pain.  Breathing stable.  Has a history of migraine headaches.  Over the past weekend had migraine.  Typical migraine with her usual visual aura.  Took tylenol.  Resolved.  No persistent problems with headaches.  Does report noticing some dizziness.  Previously noticed some left hand and arm tingling.  No weakness.  No other neurological changes.  On plavix.  Eating and drinking well.  No nausea or vomiting.  Bowels stable.     ROS: See pertinent positives and negatives per HPI.  Past Medical History:  Diagnosis Date  . Anemia   . Arthritis    back and knees  . Asthma   . Chronic diarrhea   . COPD (chronic obstructive pulmonary disease) ([place]   . Coronary artery disease    s/p  stent placement 06/25/06  . Depression    secondary to the death of her husband (died 89  . Diabetes [place] complication [place] 05/2008  . Diverticulitis   . Fatty infiltration of liver   . GERD (gastroesophageal reflux disease)   . Hypertension   . Hypertriglyceridemia   . Sleep apnea    on CPAP  . Spastic colon   . Tobacco abuse     Past Surgical History:  Procedure Laterality Date  . ABDOMINAL HYSTERECTOMY  with left ovary in place 1996  . APPENDECTOMY  1985  . BREAST BIOPSY Left 10/14/2017   calcs bx, fibrosis giant cell reaction and chronic inflammation, negative for malignancy.   .Marland KitchenCARDIAC CATHETERIZATION  2007   stents  . CESAREAN SECTION  1984  . CHOLECYSTECTOMY  1985  . COLONOSCOPY WITH PROPOFOL N/A 09/13/2016   Procedure: COLONOSCOPY WITH PROPOFOL;  Surgeon: RManya Silvas MD;  [facility];  Service: Endoscopy;  Laterality: N/A;  . COLONOSCOPY WITH PROPOFOL N/A 11/09/2018   Procedure: COLONOSCOPY WITH PROPOFOL;  Surgeon: EManya Silvas MD;  [facility];  Service: Endoscopy;  Laterality: N/A;  . ESOPHAGOGASTRODUODENOSCOPY (EGD) WITH PROPOFOL N/A 02/02/2018   Procedure: ESOPHAGOGASTRODUODENOSCOPY (EGD) WITH PROPOFOL;  Surgeon: EManya Silvas MD;  [facility];  Service: Endoscopy;  Laterality: N/A;  . JOINT REPLACEMENT     bilateral knee replacements  . KNEE ARTHROSCOPY  Arthroscopic left knee surgery   . KNEE SURGERY  status post knee surgey   .  LEFT HEART CATH AND CORONARY ANGIOGRAPHY Left 05/14/2018   Procedure: LEFT HEART CATH AND CORONARY ANGIOGRAPHY;  Surgeon: Corey Skains, MD;  Location: [place] [facility];  Service: Cardiovascular;  Laterality: Left;  . REPLACEMENT TOTAL KNEE  ([facility])  . SHOULDER SURGERY  shoulder operation secondary to a torn tendon    Family History  Problem Relation Age of Onset  . Other Mother        Hit by a fire truck and has had multiple operations on her back ,  and has history of MVP   . Mitral valve prolapse Mother   . Lung cancer Mother   . Heart disease Father        myocardial infarction and is status post bypass surgery  . Mitral valve prolapse Sister   . Hepatitis C Brother   . Cirrhosis Brother   . Colon cancer Paternal Aunt     SOCIAL HX: reviewed.    Current Outpatient Medications:  .  ADVAIR DISKUS 250-50 MCG/DOSE AEPB, INHALE 1 PUFF 2 TIMES A DAY. RINSE MOUTH AND SPIT AFTER EACH USE., Disp: 180 each, Rfl: 0 .  amLODipine (NORVASC) 10 MG tablet, Take 0.5 tablets (5 mg total) by mouth daily., Disp: 90 tablet, Rfl: 0 .  aspirin 81 MG tablet, Take 81 mg by mouth daily. , Disp: , Rfl:  .  atorvastatin (LIPITOR) 20 MG tablet, Take 1 tablet (20 mg total) by mouth every evening., Disp: 90 tablet, Rfl: 1 .  Brexpiprazole (REXULTI) 2 MG TABS, Take 2 mg by mouth daily., Disp: , Rfl:  .  CALCIUM-VITAMIN D PO, Take 1 tablet by mouth daily. , Disp: , Rfl:  .  clopidogrel (PLAVIX) 75 MG tablet, TAKE ONE TABLET BY MOUTH EVERY DAY, Disp: 90 tablet, Rfl: 3 .  colestipol (COLESTID) 1 g tablet, Take 2 g by mouth daily. , Disp: , Rfl:  .  cyclobenzaprine (FLEXERIL) 10 MG tablet, TAKE ONE TABLET BY MOUTH 3 TIMES A DAY AS NEEDED FOR MUSCLE SPASMS, Disp: 90 tablet, Rfl: 1 .  DULoxetine (CYMBALTA) 60 MG capsule, Take 60 mg by mouth daily., Disp: , Rfl:  .  hyoscyamine (LEVSIN SL) 0.125 MG SL tablet, Place under the tongue., Disp: , Rfl:  .  isosorbide mononitrate (IMDUR) 30 MG 24 hr tablet, Take by mouth., Disp: , Rfl:  .  liraglutide (VICTOZA) 18 MG/3ML SOPN, Inject 0.6 mg subcutaneously daily for 1 week, then increase to 1.2 mg daily for 1 week, then increase to 1.8 mg daily thereafter. (Patient taking differently: Inject 1.8 mg into the skin daily. ), Disp: 1 pen, Rfl: 0 .  metFORMIN (GLUCOPHAGE-XR) 500 MG 24 hr tablet, TAKE TWO TABLETS (1000MG) BY MOUTH 2 TIMES A DAY, Disp: 360 tablet, Rfl: 1 .  mupirocin ointment (BACTROBAN) 2 %, Apply to affected area  bid, Disp: 22 g, Rfl: 0 .  nystatin (NYSTATIN) powder, APPLY TOPICALLY 2 TIMES A DAY, Disp: 15 g, Rfl: 0 .  nystatin cream (MYCOSTATIN), APPLY ONE APPLICATION TOPICALLY 2 TIMES A DAY (Patient taking differently: Apply 1 application topically twice daily as needed for yeast infection), Disp: 30 g, Rfl: 0 .  pantoprazole (PROTONIX) 40 MG tablet, Take 1 tablet (40 mg total) by mouth 2 (two) times daily before a meal., Disp: 180 tablet, Rfl: 1 .  propranolol (INDERAL) 40 MG tablet, TAKE ONE TABLET BY MOUTH 2 TIMES A DAY, Disp: 180 tablet, Rfl: 3 .  sucralfate (CARAFATE) 1 g tablet, Take 1 g by mouth 2 (two)  times daily before a meal. , Disp: , Rfl:  .  traZODone (DESYREL) 100 MG tablet, Take 200 mg by mouth at bedtime. , Disp: , Rfl:  .  VENTOLIN HFA 108 (90 Base) MCG/ACT inhaler, INHALE 2 PUFFS FOUR TIMES A DAY, Disp: 54 g, Rfl: 0 .  telmisartan (MICARDIS) 20 MG tablet, Take 1 tablet (20 mg total) by mouth daily., Disp: 90 tablet, Rfl: 0 .  telmisartan (MICARDIS) 20 MG tablet, Take 1 tablet (20 mg total) by mouth daily., Disp: 15 tablet, Rfl: 0  EXAM:  VITALS per patient if applicable:  579/72, 88 and 161/87, 86  GENERAL: alert.  Sounds to be in no acute distress.  Answering questions appropriately.    PSYCH/NEURO: pleasant and cooperative, no obvious depression or anxiety, speech and thought processing grossly intact  ASSESSMENT AND PLAN:  Discussed the following assessment and plan:  CAD (coronary artery disease) S/p stent placement.  Has seen cardiology.  On plavix.  No chest pain or sob.  Continue risk factor modification.    COPD (chronic obstructive pulmonary disease) Breathing stable.  Continue current regimen.    Diabetes Low carb diet and exercise.  Follow met b and a1c.   Essential hypertension, benign Blood pressure elevated.  Just recently increased amlodipine to 44m q day.  Start micardis 296mq day.  Follow pressures.  Follow metabolic apnel.    Hypercholesterolemia On  lipitor.  Low cholesterol diet and exercise.  Follow lipid panel and liver function tests.    History of migraine headaches Reports history of migraine headaches.  States had typical migraine this past weekend.  No headache now.  Some light headedness and hand tingling as outlined.  Unclear etiology.  Discussed referral to ER for further evaluation. She declines.  Discussed further testing/scanning, etc.  She declines.  Wants to treat blood pressure and see if symptoms resolve.  Continue plavix.  Any acute change or worsening symptoms, she is to be reevaluated.      I discussed the assessment and treatment plan with the patient. The patient was provided an opportunity to ask questions and all were answered. The patient agreed with the plan and demonstrated an understanding of the instructions.   The patient was advised to call back or seek an in-person evaluation if the symptoms worsen or if the condition fails to improve as anticipated.  I provided 25 minutes of non-face-to-face time during this encounter.   ChEinar PheasantMD

## 2019-05-10 ENCOUNTER — Encounter: Payer: Self-pay | Admitting: Internal Medicine

## 2019-05-10 DIAGNOSIS — Z8669 Personal history of other diseases of the nervous system and sense organs: Secondary | ICD-10-CM | POA: Insufficient documentation

## 2019-05-10 NOTE — Assessment & Plan Note (Signed)
Blood pressure elevated.  Just recently increased amlodipine to 10mg  q day.  Start micardis 20mg  q day.  Follow pressures.  Follow metabolic apnel.

## 2019-05-10 NOTE — Assessment & Plan Note (Signed)
Low carb diet and exercise.  Follow met b and a1c.  

## 2019-05-10 NOTE — Assessment & Plan Note (Signed)
Breathing stable.  Continue current regimen.

## 2019-05-10 NOTE — Assessment & Plan Note (Signed)
S/p stent placement.  Has seen cardiology.  On plavix.  No chest pain or sob.  Continue risk factor modification.

## 2019-05-10 NOTE — Assessment & Plan Note (Signed)
Reports history of migraine headaches.  States had typical migraine this past weekend.  No headache now.  Some light headedness and hand tingling as outlined.  Unclear etiology.  Discussed referral to ER for further evaluation. She declines.  Discussed further testing/scanning, etc.  She declines.  Wants to treat blood pressure and see if symptoms resolve.  Continue plavix.  Any acute change or worsening symptoms, she is to be reevaluated.

## 2019-05-10 NOTE — Assessment & Plan Note (Signed)
On lipitor.  Low cholesterol diet and exercise.  Follow lipid panel and liver function tests.   

## 2019-05-14 ENCOUNTER — Encounter: Payer: Self-pay | Admitting: Internal Medicine

## 2019-05-17 ENCOUNTER — Other Ambulatory Visit (INDEPENDENT_AMBULATORY_CARE_PROVIDER_SITE_OTHER): Payer: Self-pay

## 2019-05-17 ENCOUNTER — Other Ambulatory Visit: Payer: Self-pay

## 2019-05-17 DIAGNOSIS — I10 Essential (primary) hypertension: Secondary | ICD-10-CM

## 2019-05-17 LAB — BASIC METABOLIC PANEL
BUN: 9 mg/dL (ref 6–23)
CO2: 28 mEq/L (ref 19–32)
Calcium: 9 mg/dL (ref 8.4–10.5)
Chloride: 100 mEq/L (ref 96–112)
Creatinine, Ser: 0.65 mg/dL (ref 0.40–1.20)
GFR: 92 mL/min (ref >60.00)
Glucose, Bld: 150 mg/dL — ABNORMAL HIGH (ref 70–99)
Potassium: 3.9 mEq/L (ref 3.5–5.1)
Sodium: 134 mEq/L — ABNORMAL LOW (ref 135–145)

## 2019-05-18 ENCOUNTER — Other Ambulatory Visit: Payer: Self-pay | Admitting: Internal Medicine

## 2019-05-18 DIAGNOSIS — E871 Hypo-osmolality and hyponatremia: Secondary | ICD-10-CM

## 2019-05-18 NOTE — Progress Notes (Signed)
Order placed for f/u sodium check.   

## 2019-05-20 ENCOUNTER — Telehealth: Payer: Self-pay | Admitting: Pharmacist

## 2019-05-20 NOTE — Telephone Encounter (Signed)
05/20/2019 1:59:22 PM - Victoza & tips refill to provider  05/20/2019 Mailing [employer] refill request for Victoza Inject 1.8mg  once daily #4 & Novofine 32G tips to Dr. Einar Pheasant to sign & return.Kathryn Friedman

## 2019-05-21 ENCOUNTER — Encounter: Payer: Self-pay | Admitting: Internal Medicine

## 2019-05-27 ENCOUNTER — Encounter: Payer: Self-pay | Admitting: Internal Medicine

## 2019-06-02 ENCOUNTER — Other Ambulatory Visit (INDEPENDENT_AMBULATORY_CARE_PROVIDER_SITE_OTHER): Payer: Self-pay

## 2019-06-02 ENCOUNTER — Telehealth: Payer: Self-pay | Admitting: Pharmacist

## 2019-06-02 ENCOUNTER — Other Ambulatory Visit: Payer: Self-pay

## 2019-06-02 ENCOUNTER — Encounter: Payer: Self-pay | Admitting: Internal Medicine

## 2019-06-02 DIAGNOSIS — E871 Hypo-osmolality and hyponatremia: Secondary | ICD-10-CM

## 2019-06-02 LAB — SODIUM: Sodium: 134 mEq/L — ABNORMAL LOW (ref 135–145)

## 2019-06-02 NOTE — Telephone Encounter (Signed)
06/02/2019 8:57:18 AM - Advair&Ventolin GSK renewal to pt & dr  06/02/2019 Mailing [place] renewal application to patient to sign & return, also mailing scripts to Dr. Einar Pheasant to sign & return for Advair 250/50 & Ventolin HFA.Kathryn Friedman

## 2019-06-17 ENCOUNTER — Encounter: Payer: Self-pay | Admitting: Pharmacist

## 2019-06-17 ENCOUNTER — Other Ambulatory Visit: Payer: Self-pay

## 2019-06-17 ENCOUNTER — Other Ambulatory Visit: Payer: Self-pay | Admitting: Internal Medicine

## 2019-06-17 ENCOUNTER — Ambulatory Visit: Payer: Self-pay | Admitting: Pharmacist

## 2019-06-17 DIAGNOSIS — Z79899 Other long term (current) drug therapy: Secondary | ICD-10-CM

## 2019-06-17 NOTE — Progress Notes (Signed)
[facility] Phone Visit Note  Patient: Kathryn Friedman MRN: [id number] Date of Birth: 1956/01/18 PCP: Einar Pheasant, MD   Kathryn Friedman 63 y.o. female was contacted today for a medication therapy management visit outreach call. Two identifiers were used to verify patient identity over the phone.   There were no vitals taken for this visit.  Patient Information   Past Medical History:  Diagnosis Date  . Anemia   . Arthritis    back and knees  . Asthma   . Chronic diarrhea   . COPD (chronic obstructive pulmonary disease) ([place])   . Coronary artery disease    s/p stent placement 06/25/06  . Depression    secondary to the death of her husband (died 75)  . Diabetes mellitus without complication ([place]) Diagnosed 05/2008  . Diverticulitis   . Fatty infiltration of liver   . GERD (gastroesophageal reflux disease)   . Hypertension   . Hypertriglyceridemia   . Sleep apnea    on CPAP  . Spastic colon   . Tobacco abuse       Past Surgical History:  Procedure Laterality Date  . ABDOMINAL HYSTERECTOMY  with left ovary in place 1996  . APPENDECTOMY  1985  . BREAST BIOPSY Left 10/14/2017   calcs bx, fibrosis giant cell reaction and chronic inflammation, negative for malignancy.   Marland Kitchen CARDIAC CATHETERIZATION  2007   stents  . CESAREAN SECTION  1984  . CHOLECYSTECTOMY  1985  . COLONOSCOPY WITH PROPOFOL N/A 09/13/2016   Procedure: COLONOSCOPY WITH PROPOFOL;  Surgeon: Manya Silvas, MD;  Location[facility];  Service: Endoscopy;  Laterality: N/A;  . COLONOSCOPY WITH PROPOFOL N/A 11/09/2018   Procedure: COLONOSCOPY WITH PROPOFOL;  Surgeon: Manya Silvas, MD;  Location: [facility]COPY;  Service: Endoscopy;  Laterality: N/A;  . ESOPHAGOGASTRODUODENOSCOPY (EGD) WITH PROPOFOL N/A 02/02/2018   Procedure: ESOPHAGOGASTRODUODENOSCOPY (EGD) WITH PROPOFOL;  Surgeon: Manya Silvas, MD;  [facility];  Service: Endoscopy;  Laterality:  N/A;  . JOINT REPLACEMENT     bilateral knee replacements  . KNEE ARTHROSCOPY  Arthroscopic left knee surgery   . KNEE SURGERY  status post knee surgey   . LEFT HEART CATH AND CORONARY ANGIOGRAPHY Left 05/14/2018   Procedure: LEFT HEART CATH AND CORONARY ANGIOGRAPHY;  Surgeon: Corey Skains, MD;  Location: [place] [facility];  Service: Cardiovascular;  Laterality: Left;  . REPLACEMENT TOTAL KNEE  ([facility])  . SHOULDER SURGERY  shoulder operation secondary to a torn tendon     Family History  Problem Relation Age of Onset  . Other Mother        Hit by a fire truck and has had multiple operations on her back , and has history of MVP   . Mitral valve prolapse Mother   . Lung cancer Mother   . Heart disease Father        myocardial infarction and is status post bypass surgery  . Mitral valve prolapse Sister   . Hepatitis C Brother   . Cirrhosis Brother   . Colon cancer Paternal Aunt     New Diagnoses (since last visit): N/A  Family Support: Good  Lifestyle Diet: Breakfast: nothing Lunch: meat, vegetables Dinner: nothing Drinks: coffee, orange juice, strawberry soda Exercise: none   Social History   Substance and Sexual Activity  Alcohol Use No  . Alcohol/week: 0.0 standard drinks      Social History   Tobacco Use  Smoking Status Current Every Day Smoker  .  Packs/day: 1.50  . Years: 45.00  . Pack years: 67.50  . Types: Cigarettes  Smokeless Tobacco Never Used      Health Maintenance  Topic Date Due  . Hepatitis C Screening  05-08-1956  . HIV Screening  03/09/1971  . PAP SMEAR-Modifier  03/08/1977  . OPHTHALMOLOGY EXAM  03/20/2018  . MAMMOGRAM  09/24/2018  . INFLUENZA VACCINE  06/26/2019  . HEMOGLOBIN A1C  10/28/2019  . FOOT EXAM  12/11/2019  . TETANUS/TDAP  04/21/2028  . COLONOSCOPY  11/09/2028  . PNEUMOCOCCAL POLYSACCHARIDE VACCINE AGE 86-64 HIGH RISK  Completed   Outpatient Encounter Medications as of 06/17/2019  Medication Sig  . ADVAIR DISKUS  250-50 MCG/DOSE AEPB INHALE 1 PUFF 2 TIMES A DAY. RINSE MOUTH AND SPIT AFTER EACH USE.  Marland Kitchen amLODipine (NORVASC) 5 MG tablet Take 5 mg by mouth daily.  Marland Kitchen aspirin 81 MG tablet Take 81 mg by mouth daily.   Marland Kitchen atorvastatin (LIPITOR) 20 MG tablet Take 1 tablet (20 mg total) by mouth every evening.  . Brexpiprazole (REXULTI) 2 MG TABS Take 2 mg by mouth daily.  Marland Kitchen CALCIUM PO Take 1 tablet by mouth daily.  . clopidogrel (PLAVIX) 75 MG tablet TAKE ONE TABLET BY MOUTH EVERY DAY  . colestipol (COLESTID) 1 g tablet Take 3 g by mouth daily.   . DULoxetine (CYMBALTA) 60 MG capsule Take 60 mg by mouth daily.  Marland Kitchen escitalopram (LEXAPRO) 10 MG tablet Take 15 mg by mouth daily.  . hyoscyamine (LEVSIN SL) 0.125 MG SL tablet Place 0.125 mg under the tongue every 4 (four) hours as needed for cramping (diarrhea).   . isosorbide mononitrate (IMDUR) 30 MG 24 hr tablet Take 30 mg by mouth daily.   Marland Kitchen liraglutide (VICTOZA) 18 MG/3ML SOPN Inject 0.6 mg subcutaneously daily for 1 week, then increase to 1.2 mg daily for 1 week, then increase to 1.8 mg daily thereafter. (Patient taking differently: Inject 1.8 mg into the skin daily. )  . Loperamide HCl (IMODIUM A-D PO) Take 2 tablets by mouth daily.  . metFORMIN (GLUCOPHAGE-XR) 500 MG 24 hr tablet TAKE TWO TABLETS (1000MG ) BY MOUTH 2 TIMES A DAY  . Multiple Vitamin (MULTIVITAMIN PO) Take 1 tablet by mouth daily.  Marland Kitchen nystatin (NYSTATIN) powder APPLY TOPICALLY 2 TIMES A DAY (Patient taking differently: Apply topically 2 (two) times daily as needed (yeast infection). )  . nystatin cream (MYCOSTATIN) APPLY ONE APPLICATION TOPICALLY 2 TIMES A DAY (Patient taking differently: Apply 1 application topically 2 (two) times daily as needed (yeast infection). )  . pantoprazole (PROTONIX) 40 MG tablet Take 1 tablet (40 mg total) by mouth 2 (two) times daily before a meal.  . Probiotic Product (PROBIOTIC DAILY PO) Take 1 tablet by mouth daily.  . propranolol (INDERAL) 40 MG tablet TAKE ONE  TABLET BY MOUTH 2 TIMES A DAY  . sucralfate (CARAFATE) 1 g tablet Take 1 g by mouth 2 (two) times daily before a meal.   . telmisartan (MICARDIS) 20 MG tablet Take 1 tablet (20 mg total) by mouth daily.  . traZODone (DESYREL) 100 MG tablet Take 200 mg by mouth at bedtime.   . VENTOLIN HFA 108 (90 Base) MCG/ACT inhaler INHALE 2 PUFFS FOUR TIMES A DAY  . cyclobenzaprine (FLEXERIL) 10 MG tablet TAKE ONE TABLET BY MOUTH 3 TIMES A DAY AS NEEDED FOR MUSCLE SPASMS (Patient not taking: Reported on 06/17/2019)  . mupirocin ointment (BACTROBAN) 2 % Apply to affected area bid (Patient not taking: Reported on 06/17/2019)  . [  DISCONTINUED] amLODipine (NORVASC) 10 MG tablet Take 0.5 tablets (5 mg total) by mouth daily. (Patient not taking: Reported on 06/17/2019)  . [DISCONTINUED] CALCIUM-VITAMIN D PO Take 1 tablet by mouth daily.   . [DISCONTINUED] telmisartan (MICARDIS) 20 MG tablet Take 1 tablet (20 mg total) by mouth daily. (Patient not taking: Reported on 06/17/2019)   No facility-administered encounter medications on file as of 06/17/2019.     Assessment and Plan:  1. Adherence Pt reports never missing doses of medications. Pt has pill organizer that she sets up every Saturday. Rx fill hx indicates compliance. Pt was knowledgeable about each medication name, dose, strength, frequency, and indication.   2. HTN/CAD/PAD Hx of HTN, CAD, PAD on amlodipine 5mg  daily, telmisartan 20mg  daily, Imdur 30mg  daily, aspirin 81mg  daily, clopidogrel 75mg  daily. Pt reports taking blood pressure regularly with average 130-140/80-85's. Pt was unsure of BP goal but endorsed understanding when told <130/80. Pt stated she does not get any exercise but when asked about walking she stated she could have a goal to walk around in her house for 5 minutes daily.   3. Diabetes  Hx of diabetes on metformin 1000mg  BID, Victoza 1.8mg  daily. A1c (04/2019) 6.7. Pt states that she does have a glucose meter at home but does not use it because  she forgets. Suggested to pt to leave meter next to BP cuff so she can remember to do them at the same time. Pt endorsed understanding. Pt endorsed infrequent episodes of hypoglycemia with symptoms of shaking and feeling like she was going to pass out. Pt keeps OJ on hand just in case and was not concerned about being able to recognize symptoms.   4. Hyperlipidemia Hx of hyperlipidemia on atorvastatin 20mg  daily, colestipol 3g daily. Lipid panel (04/2019) LDL 56, HDL 40, TG 118. Congratulated pt on lipid control. Pt reports no issues.   5. Smoking cessation Hx of smoking, smokes 1 1/2 ppd. Pt was not interested in quitting at this time.   6. COPD Hx of COPD on Advair 1 puff BID and Ventolin PRN. Pt reports needing to use Ventolin 4 times daily but that this is normal for her. Pt has no concerns over breathing at this time.  7. Depression Hx of depression on duloxetine 60mg  daily, brexpiprazole 2mg  daily, trazodone 200mg  nightly, and recently started escitalopram 15mg  daily. Pt reports overall feeling of well being and reports no issues or concerning side effects with meds. Pt reports difficulty staying asleep at night but she is used to waking up at least twice nightly.    8. Migraine Hx of migraine on propranolol 40mg  BID. Pt reports no issue and that migraines are well controlled.   9. GERD Hx of GERD on pantoprazole 40mg  BID. Pt reports worsening heartburn at night. Pt states she always sleeps with two pillows and would not be able to sleep if she were to add more. Pt was amenable to cutting out spicy foods and considering an alternative agent to help relieve heartburn.   RTC: 1y (06/16/2020)  Ladoris Gene, PharmD Candidate [facility]  Cosigned by: Netta Neat, PharmD, [facility]) [phone]

## 2019-06-21 ENCOUNTER — Other Ambulatory Visit: Payer: Self-pay | Admitting: Internal Medicine

## 2019-06-21 NOTE — Telephone Encounter (Signed)
X ok'd for cyclobenazprine #90 with one refill.

## 2019-06-24 ENCOUNTER — Telehealth: Payer: Self-pay | Admitting: Pharmacist

## 2019-06-24 NOTE — Telephone Encounter (Signed)
06/24/2019 9:44:41 AM - [employer] renewal faxed Ventolin & Advair  06/24/2019 Faxed [employer] renewal application for Ventolin HFA & Advair 250/50.Delos Haring

## 2019-06-28 LAB — HM DIABETES EYE EXAM

## 2019-06-29 ENCOUNTER — Other Ambulatory Visit: Payer: Self-pay

## 2019-06-30 ENCOUNTER — Ambulatory Visit (INDEPENDENT_AMBULATORY_CARE_PROVIDER_SITE_OTHER): Payer: Self-pay | Admitting: Internal Medicine

## 2019-06-30 VITALS — BP 132/68 | HR 74 | Temp 98.8°F | Resp 16 | Ht 65.0 in | Wt 227.6 lb

## 2019-06-30 DIAGNOSIS — D649 Anemia, unspecified: Secondary | ICD-10-CM

## 2019-06-30 DIAGNOSIS — J449 Chronic obstructive pulmonary disease, unspecified: Secondary | ICD-10-CM

## 2019-06-30 DIAGNOSIS — I10 Essential (primary) hypertension: Secondary | ICD-10-CM

## 2019-06-30 DIAGNOSIS — F32A Depression, unspecified: Secondary | ICD-10-CM

## 2019-06-30 DIAGNOSIS — K219 Gastro-esophageal reflux disease without esophagitis: Secondary | ICD-10-CM

## 2019-06-30 DIAGNOSIS — J849 Interstitial pulmonary disease, unspecified: Secondary | ICD-10-CM

## 2019-06-30 DIAGNOSIS — I2510 Atherosclerotic heart disease of native coronary artery without angina pectoris: Secondary | ICD-10-CM

## 2019-06-30 DIAGNOSIS — Z87891 Personal history of nicotine dependence: Secondary | ICD-10-CM

## 2019-06-30 DIAGNOSIS — E7800 Pure hypercholesterolemia, unspecified: Secondary | ICD-10-CM

## 2019-06-30 DIAGNOSIS — G4733 Obstructive sleep apnea (adult) (pediatric): Secondary | ICD-10-CM

## 2019-06-30 DIAGNOSIS — R000 Tachycardia, unspecified: Secondary | ICD-10-CM

## 2019-06-30 DIAGNOSIS — E1159 Type 2 diabetes mellitus with other circulatory complications: Secondary | ICD-10-CM

## 2019-06-30 DIAGNOSIS — F329 Major depressive disorder, single episode, unspecified: Secondary | ICD-10-CM

## 2019-06-30 MED ORDER — TELMISARTAN 40 MG PO TABS: 40.0000 mg | ORAL_TABLET | Freq: Every day | ORAL | 1 refills | Status: DC
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
## 2019-07-01 ENCOUNTER — Telehealth: Payer: Self-pay | Admitting: Internal Medicine

## 2019-07-01 ENCOUNTER — Other Ambulatory Visit: Payer: Self-pay

## 2019-07-01 MED ORDER — AMLODIPINE BESYLATE 10 MG PO TABS: 10.0000 mg | ORAL_TABLET | Freq: Every day | ORAL | 2 refills | Status: DC

## 2019-07-01 NOTE — Telephone Encounter (Signed)
rx refilled. Message left for pt

## 2019-07-01 NOTE — Telephone Encounter (Signed)
This med was originally prescribed by a historical provider.

## 2019-07-01 NOTE — Telephone Encounter (Signed)
Relation to pt: self  Call back number: [phone]  Pharmacy: [facility] [place], [place] [address] [phone] (Phone) [phone] (Fax     Reason for call:  Patient requesting amLODipine (NORVASC) 10 MG tablet , patient states PCP increased from 5 MG to 10 MG, informed patient please allow 48 to 72 hour turn around time.

## 2019-07-04 ENCOUNTER — Encounter: Payer: Self-pay | Admitting: Internal Medicine

## 2019-07-04 NOTE — Assessment & Plan Note (Signed)
Follow cbc.  

## 2019-07-04 NOTE — Assessment & Plan Note (Signed)
Blood pressure still elevated.  Increase micardis to 40mg  q day.  Follow pressures.  Follow metabolic panel.

## 2019-07-04 NOTE — Assessment & Plan Note (Signed)
CPAP.  

## 2019-07-04 NOTE — Assessment & Plan Note (Signed)
Low carb diet and exercise.  Follow met b and a1c.  a1c 6.7.

## 2019-07-04 NOTE — Assessment & Plan Note (Signed)
On lipitor.  Low cholesterol diet and exercise.  Follow lipid panel and liver function tests.   

## 2019-07-04 NOTE — Assessment & Plan Note (Signed)
Stable

## 2019-07-04 NOTE — Assessment & Plan Note (Signed)
Discussed the need to stop smoking.  She elects to continue.  Follow.  

## 2019-07-04 NOTE — Assessment & Plan Note (Signed)
Breathing stable.  Continue current medication regimen.

## 2019-07-04 NOTE — Assessment & Plan Note (Signed)
Controlled on protonix.   

## 2019-07-04 NOTE — Assessment & Plan Note (Signed)
Breathing stable.

## 2019-07-04 NOTE — Assessment & Plan Note (Signed)
S/p stent placement.  Has seen cardiology.  On plavix.  Increased heart rate - 150s.  EKG - SR with no acute ischemic changes.  Refer back to cardiology for further evaluation - with question of need for further cardiac w/up (for example monitor, echo, etc).  Continue risk factor modification.

## 2019-07-16 ENCOUNTER — Encounter: Payer: Self-pay | Admitting: Internal Medicine

## 2019-07-16 ENCOUNTER — Ambulatory Visit (INDEPENDENT_AMBULATORY_CARE_PROVIDER_SITE_OTHER): Payer: Self-pay | Admitting: Internal Medicine

## 2019-07-16 DIAGNOSIS — I10 Essential (primary) hypertension: Secondary | ICD-10-CM

## 2019-07-16 DIAGNOSIS — J449 Chronic obstructive pulmonary disease, unspecified: Secondary | ICD-10-CM

## 2019-07-16 DIAGNOSIS — E1159 Type 2 diabetes mellitus with other circulatory complications: Secondary | ICD-10-CM

## 2019-07-16 DIAGNOSIS — K219 Gastro-esophageal reflux disease without esophagitis: Secondary | ICD-10-CM

## 2019-07-16 DIAGNOSIS — E7800 Pure hypercholesterolemia, unspecified: Secondary | ICD-10-CM

## 2019-07-16 NOTE — Progress Notes (Signed)
Patient ID: Kathryn Friedman, female   DOB: [birth year], 63 y.o.   MRN: [id number]   Virtual Visit via  Note  This visit type was conducted due to national recommendations for restrictions regarding the COVID-19 pandemic (e.g. social distancing).  This format is felt to be most appropriate for this patient at this time.  All issues noted in this document were discussed and addressed.  No physical exam was performed (except for noted visual exam findings with Video Visits).   I connected with [name] by telephone and verified that I am speaking with the correct person using two identifiers. Location patient: home Location provider: work or home office Persons participating in the virtual visit: patient, provider  I discussed the limitations, risks, security and privacy concerns of performing an evaluation and management service by telephone and the availability of in person appointments. The patient expressed understanding and agreed to proceed.   Reason for visit: acute visit  HPI: She called with concerns regarding sore throat, cough and chest congestion.  States started having symptoms over the past week.  Noticed right side sore throat for 2 days.  Left side sore - 4-5 days.  Throat is better now.  Increased drainage.  No fever.  Previous laryngitis.  Eating and drinking ok.  Two nights ago, noticed some cough at night.  Some previous tightness in her chest.  Cough previously productive of yellow mucus.  States no chest tightness now.  Cough is better.  No sob.  Overall feels much better.  No chest pain.  She has been taking tylenol, taking cough syrup and gargling with salt water.  Eating. Some acid reflux.  Taking medication.  No vomiting.  Due to f/u soon with GI.  Blood pressure doing better.  Most readings now 120-130s/70s.      ROS: See pertinent positives and negatives per HPI.  Past Medical History:  Diagnosis Date  . Anemia   . Arthritis    back and knees   . Asthma   . Chronic diarrhea   . COPD (chronic obstructive pulmonary disease) ([address])   . Coronary artery disease    s/p stent placement 06/25/06  . Depression    secondary to the death of her husband (died 65)  . Diabetes mellitus without complication ([place]) Diagnosed 05/2008  . Diverticulitis   . Fatty infiltration of liver   . GERD (gastroesophageal reflux disease)   . Hypertension   . Hypertriglyceridemia   . Sleep apnea    on CPAP  . Spastic colon   . Tobacco abuse     Past Surgical History:  Procedure Laterality Date  . ABDOMINAL HYSTERECTOMY  with left ovary in place 1996  . APPENDECTOMY  1985  . BREAST BIOPSY Left 10/14/2017   calcs bx, fibrosis giant cell reaction and chronic inflammation, negative for malignancy.   Marland Kitchen CARDIAC CATHETERIZATION  2007   stents  . CESAREAN SECTION  1984  . CHOLECYSTECTOMY  1985  . COLONOSCOPY WITH PROPOFOL N/A 09/13/2016   Procedure: COLONOSCOPY WITH PROPOFOL;  Surgeon: Manya Silvas, MD;  Location[facility] ENDOSCOPY;  Service: Endoscopy;  Laterality: N/A;  . COLONOSCOPY WITH PROPOFOL N/A 11/09/2018   Procedure: COLONOSCOPY WITH PROPOFOL;  Surgeon: Manya Silvas, MD;  Location: [facility] ENDOSCOPY;  Service: Endoscopy;  Laterality: N/A;  . ESOPHAGOGASTRODUODENOSCOPY (EGD) WITH PROPOFOL N/A 02/02/2018   Procedure: ESOPHAGOGASTRODUODENOSCOPY (EGD) WITH PROPOFOL;  Surgeon: Manya Silvas, MD;  Location: [facility] ENDOSCOPY;  Service: Endoscopy;  Laterality: N/A;  . JOINT REPLACEMENT  bilateral knee replacements  . KNEE ARTHROSCOPY  Arthroscopic left knee surgery   . KNEE SURGERY  status post knee surgey   . LEFT HEART CATH AND CORONARY ANGIOGRAPHY Left 05/14/2018   Procedure: LEFT HEART CATH AND CORONARY ANGIOGRAPHY;  Surgeon: Corey Skains, MD;  Location: [place] CV LAB;  Service: Cardiovascular;  Laterality: Left;  . REPLACEMENT TOTAL KNEE  ([facility])  . SHOULDER SURGERY  shoulder operation secondary to a torn tendon    Family  History  Problem Relation Age of Onset  . Other Mother        Hit by a fire truck and has had multiple operations on her back , and has history of MVP   . Mitral valve prolapse Mother   . Lung cancer Mother   . Heart disease Father        myocardial infarction and is status post bypass surgery  . Mitral valve prolapse Sister   . Hepatitis C Brother   . Cirrhosis Brother   . Colon cancer Paternal Aunt     SOCIAL HX: reviewed.    Current Outpatient Medications:  .  ADVAIR DISKUS 250-50 MCG/DOSE AEPB, INHALE 1 PUFF 2 TIMES A DAY. RINSE MOUTH AND SPIT AFTER EACH USE., Disp: 180 each, Rfl: 0 .  amLODipine (NORVASC) 10 MG tablet, Take 1 tablet (10 mg total) by mouth daily., Disp: 90 tablet, Rfl: 2 .  aspirin 81 MG tablet, Take 81 mg by mouth daily. , Disp: , Rfl:  .  atorvastatin (LIPITOR) 20 MG tablet, Take 1 tablet (20 mg total) by mouth every evening., Disp: 90 tablet, Rfl: 1 .  Brexpiprazole (REXULTI) 2 MG TABS, Take 2 mg by mouth daily., Disp: , Rfl:  .  CALCIUM PO, Take 1 tablet by mouth daily., Disp: , Rfl:  .  clopidogrel (PLAVIX) 75 MG tablet, TAKE ONE TABLET BY MOUTH EVERY DAY, Disp: 90 tablet, Rfl: 3 .  colestipol (COLESTID) 1 g tablet, Take 3 g by mouth daily. , Disp: , Rfl:  .  cyclobenzaprine (FLEXERIL) 10 MG tablet, TAKE ONE TABLET BY MOUTH 3 TIMES A DAY AS NEEDED FOR MUSCLE SPASMS, Disp: 90 tablet, Rfl: 1 .  DULoxetine (CYMBALTA) 60 MG capsule, Take 60 mg by mouth daily., Disp: , Rfl:  .  escitalopram (LEXAPRO) 10 MG tablet, Take 15 mg by mouth daily., Disp: , Rfl:  .  hyoscyamine (LEVSIN SL) 0.125 MG SL tablet, Place 0.125 mg under the tongue every 4 (four) hours as needed for cramping (diarrhea). , Disp: , Rfl:  .  isosorbide mononitrate (IMDUR) 30 MG 24 hr tablet, Take 30 mg by mouth daily. , Disp: , Rfl:  .  liraglutide (VICTOZA) 18 MG/3ML SOPN, Inject 0.6 mg subcutaneously daily for 1 week, then increase to 1.2 mg daily for 1 week, then increase to 1.8 mg daily  thereafter. (Patient taking differently: Inject 1.8 mg into the skin daily. ), Disp: 1 pen, Rfl: 0 .  Loperamide HCl (IMODIUM A-D PO), Take 2 tablets by mouth daily., Disp: , Rfl:  .  metFORMIN (GLUCOPHAGE-XR) 500 MG 24 hr tablet, TAKE TWO TABLETS (1000MG) BY MOUTH 2 TIMES A DAY, Disp: 360 tablet, Rfl: 1 .  Multiple Vitamin (MULTIVITAMIN PO), Take 1 tablet by mouth daily., Disp: , Rfl:  .  mupirocin ointment (BACTROBAN) 2 %, Apply to affected area bid (Patient not taking: Reported on 06/17/2019), Disp: 22 g, Rfl: 0 .  nystatin (NYSTATIN) powder, APPLY TOPICALLY 2 TIMES A DAY (Patient taking differently: Apply  topically 2 (two) times daily as needed (yeast infection). ), Disp: 15 g, Rfl: 0 .  nystatin cream (MYCOSTATIN), APPLY ONE APPLICATION TOPICALLY 2 TIMES A DAY (Patient taking differently: Apply 1 application topically 2 (two) times daily as needed (yeast infection). ), Disp: 30 g, Rfl: 0 .  pantoprazole (PROTONIX) 40 MG tablet, Take 1 tablet (40 mg total) by mouth 2 (two) times daily before a meal., Disp: 180 tablet, Rfl: 1 .  Probiotic Product (PROBIOTIC DAILY PO), Take 1 tablet by mouth daily., Disp: , Rfl:  .  propranolol (INDERAL) 40 MG tablet, TAKE ONE TABLET BY MOUTH 2 TIMES A DAY, Disp: 180 tablet, Rfl: 3 .  sucralfate (CARAFATE) 1 g tablet, Take 1 g by mouth 2 (two) times daily before a meal. , Disp: , Rfl:  .  telmisartan (MICARDIS) 40 MG tablet, Take 1 tablet (40 mg total) by mouth daily., Disp: 90 tablet, Rfl: 1 .  traZODone (DESYREL) 100 MG tablet, Take 200 mg by mouth at bedtime. , Disp: , Rfl:  .  VENTOLIN HFA 108 (90 Base) MCG/ACT inhaler, INHALE 2 PUFFS FOUR TIMES A DAY, Disp: 54 g, Rfl: 0  EXAM:  VITALS per patient if applicable: 696/29 85, 528/41, 90  GENERAL: alert.  Sounds to be in no acute distress.  Answering questions appropriately.    PSYCH/NEURO: pleasant and cooperative, no obvious depression or anxiety, speech and thought processing grossly intact  ASSESSMENT AND  PLAN:  Discussed the following assessment and plan:  COPD (chronic obstructive pulmonary disease) Breathing overall stable.  No chest tightness now.  Cough is better.  Feeling better.  Continue current inhaler regimen.  Robitussin as directed.  Nasal spray as directed.  Follow.    Diabetes Low carb diet and exercise.  Follow met b and a1c.    Essential hypertension, benign Blood pressure doing better.  Continue same medication regimen.  Follow pressures.  Follow metabolic panel.    GERD (gastroesophageal reflux disease) Still with some acid reflux despite protonix.  Keep f/u with GI and discuss further evaluation.    Hypercholesterolemia On lipitor.  Low cholesterol diet and exercise.  Follow lipid panel and liver function tests.      I discussed the assessment and treatment plan with the patient. The patient was provided an opportunity to ask questions and all were answered. The patient agreed with the plan and demonstrated an understanding of the instructions.   The patient was advised to call back or seek an in-person evaluation if the symptoms worsen or if the condition fails to improve as anticipated.  I provided 20 minutes of non-face-to-face time during this encounter.   Einar Pheasant, MD

## 2019-07-19 ENCOUNTER — Encounter: Payer: Self-pay | Admitting: Internal Medicine

## 2019-07-19 NOTE — Assessment & Plan Note (Signed)
Still with some acid reflux despite protonix.  Keep f/u with GI and discuss further evaluation.

## 2019-07-19 NOTE — Assessment & Plan Note (Signed)
On lipitor.  Low cholesterol diet and exercise.  Follow lipid panel and liver function tests.   

## 2019-07-19 NOTE — Assessment & Plan Note (Addendum)
Low carb diet and exercise.  Follow met b and a1c.   

## 2019-07-19 NOTE — Assessment & Plan Note (Signed)
Breathing overall stable.  No chest tightness now.  Cough is better.  Feeling better.  Continue current inhaler regimen.  Robitussin as directed.  Nasal spray as directed.  Follow.

## 2019-07-19 NOTE — Assessment & Plan Note (Signed)
Blood pressure doing better.  Continue same medication regimen.  Follow pressures.  Follow metabolic panel.   

## 2019-08-09 DIAGNOSIS — I471 Supraventricular tachycardia: Secondary | ICD-10-CM | POA: Insufficient documentation

## 2019-08-10 ENCOUNTER — Other Ambulatory Visit: Payer: Self-pay | Admitting: Internal Medicine

## 2019-08-11 ENCOUNTER — Other Ambulatory Visit: Payer: Self-pay

## 2019-08-11 ENCOUNTER — Telehealth: Payer: Self-pay

## 2019-08-11 MED ORDER — PANTOPRAZOLE SODIUM 40 MG PO TBEC: DELAYED_RELEASE_TABLET | ORAL | 0 refills | Status: DC

## 2019-08-11 NOTE — Telephone Encounter (Signed)
Refill for pantoprazole resent.  Patient aware.

## 2019-08-11 NOTE — Telephone Encounter (Signed)
Copied from [place] [id number]. Topic: General - Other >> Aug 11, 2019  9:53 AM Carolyn Stare wrote: RX showing received by pharmacy, pt contacted pharmacy and they told her they checked everywhere and they dont have the RX please resend   pantoprazole (PROTONIX) 40 MG tablet  Medication mgmnt

## 2019-08-24 ENCOUNTER — Telehealth: Payer: Self-pay | Admitting: Pharmacist

## 2019-08-24 ENCOUNTER — Other Ambulatory Visit: Payer: Self-pay | Admitting: Internal Medicine

## 2019-08-24 NOTE — Telephone Encounter (Signed)
08/24/2019 12:13:03 PM - Victoza & tips refill to Dr. Nicki Reaper  08/24/2019 Mailing [facility] [employer] refill request for Victoza Inject 1.8mg  once daily #4 & tips to Dr. Einar Pheasant to sign & return.Kathryn Friedman

## 2019-08-30 ENCOUNTER — Other Ambulatory Visit: Payer: Self-pay | Admitting: Internal Medicine

## 2019-09-06 ENCOUNTER — Other Ambulatory Visit: Payer: Self-pay

## 2019-09-06 ENCOUNTER — Other Ambulatory Visit (INDEPENDENT_AMBULATORY_CARE_PROVIDER_SITE_OTHER): Payer: Self-pay

## 2019-09-06 DIAGNOSIS — E1159 Type 2 diabetes mellitus with other circulatory complications: Secondary | ICD-10-CM

## 2019-09-06 DIAGNOSIS — E7800 Pure hypercholesterolemia, unspecified: Secondary | ICD-10-CM

## 2019-09-06 DIAGNOSIS — D649 Anemia, unspecified: Secondary | ICD-10-CM

## 2019-09-06 LAB — CBC WITH DIFFERENTIAL/PLATELET
Basophils Absolute: 0 10*3/uL (ref 0.0–0.1)
Basophils Relative: 0.6 % (ref 0.0–3.0)
Eosinophils Absolute: 0.1 10*3/uL (ref 0.0–0.7)
Eosinophils Relative: 1.5 % (ref 0.0–5.0)
HCT: 44.5 % (ref 36.0–46.0)
Hemoglobin: 14.9 g/dL (ref 12.0–15.0)
Lymphocytes Relative: 23.6 % (ref 12.0–46.0)
Lymphs Abs: 2 10*3/uL (ref 0.7–4.0)
MCHC: 33.6 g/dL (ref 30.0–36.0)
MCV: 91.9 fl (ref 78.0–100.0)
Monocytes Absolute: 0.5 10*3/uL (ref 0.1–1.0)
Monocytes Relative: 5.5 % (ref 3.0–12.0)
Neutro Abs: 5.9 10*3/uL (ref 1.4–7.7)
Neutrophils Relative %: 68.8 % (ref 43.0–77.0)
Platelets: 174 10*3/uL (ref 150.0–400.0)
RBC: 4.84 Mil/uL (ref 3.87–5.11)
RDW: 18.1 % — ABNORMAL HIGH (ref 11.5–15.5)
WBC: 8.5 10*3/uL (ref 4.0–10.5)

## 2019-09-06 LAB — BASIC METABOLIC PANEL
BUN: 5 mg/dL — ABNORMAL LOW (ref 6–23)
CO2: 27 mEq/L (ref 19–32)
Calcium: 9 mg/dL (ref 8.4–10.5)
Chloride: 105 mEq/L (ref 96–112)
Creatinine, Ser: 0.66 mg/dL (ref 0.40–1.20)
GFR: 90.31 mL/min (ref >60.00)
Glucose, Bld: 104 mg/dL — ABNORMAL HIGH (ref 70–99)
Potassium: 4.2 mEq/L (ref 3.5–5.1)
Sodium: 138 mEq/L (ref 135–145)

## 2019-09-06 LAB — HEPATIC FUNCTION PANEL
ALT: 14 U/L (ref 0–35)
AST: 18 U/L (ref 0–37)
Albumin: 3.6 g/dL (ref 3.5–5.2)
Alkaline Phosphatase: 64 U/L (ref 39–117)
Bilirubin, Direct: 0.1 mg/dL (ref 0.0–0.3)
Total Bilirubin: 0.4 mg/dL (ref 0.2–1.2)
Total Protein: 6.1 g/dL (ref 6.0–8.3)

## 2019-09-06 LAB — LIPID PANEL
Cholesterol: 96 mg/dL (ref 0–200)
HDL: 35.8 mg/dL — ABNORMAL LOW (ref >39.00)
LDL Cholesterol: 31 mg/dL (ref 0–99)
NonHDL: 59.83
Total CHOL/HDL Ratio: 3
Triglycerides: 142 mg/dL (ref 0.0–149.0)
VLDL: 28.4 mg/dL (ref 0.0–40.0)

## 2019-09-06 LAB — HEMOGLOBIN A1C: Hgb A1c MFr Bld: 6.4 % (ref 4.6–6.5)

## 2019-09-07 ENCOUNTER — Encounter: Payer: Self-pay | Admitting: Internal Medicine

## 2019-09-08 ENCOUNTER — Other Ambulatory Visit: Payer: Self-pay

## 2019-09-10 ENCOUNTER — Ambulatory Visit (INDEPENDENT_AMBULATORY_CARE_PROVIDER_SITE_OTHER): Payer: Self-pay | Admitting: Internal Medicine

## 2019-09-10 ENCOUNTER — Other Ambulatory Visit: Payer: Self-pay

## 2019-09-10 DIAGNOSIS — R1310 Dysphagia, unspecified: Secondary | ICD-10-CM

## 2019-09-10 DIAGNOSIS — I2510 Atherosclerotic heart disease of native coronary artery without angina pectoris: Secondary | ICD-10-CM

## 2019-09-10 DIAGNOSIS — F329 Major depressive disorder, single episode, unspecified: Secondary | ICD-10-CM

## 2019-09-10 DIAGNOSIS — G4733 Obstructive sleep apnea (adult) (pediatric): Secondary | ICD-10-CM

## 2019-09-10 DIAGNOSIS — K219 Gastro-esophageal reflux disease without esophagitis: Secondary | ICD-10-CM

## 2019-09-10 DIAGNOSIS — J449 Chronic obstructive pulmonary disease, unspecified: Secondary | ICD-10-CM

## 2019-09-10 DIAGNOSIS — R197 Diarrhea, unspecified: Secondary | ICD-10-CM

## 2019-09-10 DIAGNOSIS — E1159 Type 2 diabetes mellitus with other circulatory complications: Secondary | ICD-10-CM

## 2019-09-10 DIAGNOSIS — I10 Essential (primary) hypertension: Secondary | ICD-10-CM

## 2019-09-10 DIAGNOSIS — E7800 Pure hypercholesterolemia, unspecified: Secondary | ICD-10-CM

## 2019-09-10 DIAGNOSIS — F32A Depression, unspecified: Secondary | ICD-10-CM

## 2019-09-10 NOTE — Progress Notes (Signed)
Patient ID: Kathryn Friedman, female   DOB: 07-01-1956, 63 y.o.   MRN: [id number]   Virtual Visit via telephone Note  This visit type was conducted due to national recommendations for restrictions regarding the COVID-19 pandemic (e.g. social distancing).  This format is felt to be most appropriate for this patient at this time.  All issues noted in this document were discussed and addressed.  No physical exam was performed (except for noted visual exam findings with Video Visits).   I connected with [name] by telephone and verified that I am speaking with the correct person using two identifiers. Location patient: home Location provider: work Persons participating in the virtual visit: patient, provider  I discussed the limitations, risks, security and privacy concerns of performing an evaluation and management service by telephone and the availability of in person appointments. The patient expressed understanding and agreed to proceed.   Reason for visit: scheduled follow up.    HPI: She reports she is doing relatively well.  Breathing stable.  Discussed diet and exercise.  Discussed importance of staying active.  No chest pain.  No increased cough or congestion.  Some pill dysphagia.  Seeing GI.  Planning for EGD and colonoscopy.  Taking colestipol and imodium.  Bowels better.  Discussed shingrix.     ROS: See pertinent positives and negatives per HPI.  Past Medical History:  Diagnosis Date  . Anemia   . Arthritis    back and knees  . Asthma   . Chronic diarrhea   . COPD (chronic obstructive pulmonary disease) ([place])   . Coronary artery disease    s/p stent placement 06/25/06  . Depression    secondary to the death of her husband (died 66)  . Diabetes mellitus without complication ([place]) Diagnosed 05/2008  . Diverticulitis   . Fatty infiltration of liver   . GERD (gastroesophageal reflux disease)   . Hypertension   . Hypertriglyceridemia   . Sleep apnea     on CPAP  . Spastic colon   . Tobacco abuse     Past Surgical History:  Procedure Laterality Date  . ABDOMINAL HYSTERECTOMY  with left ovary in place 1996  . APPENDECTOMY  1985  . BREAST BIOPSY Left 10/14/2017   calcs bx, fibrosis giant cell reaction and chronic inflammation, negative for malignancy.   Marland Kitchen CARDIAC CATHETERIZATION  2007   stents  . CESAREAN SECTION  1984  . CHOLECYSTECTOMY  1985  . COLONOSCOPY WITH PROPOFOL N/A 09/13/2016   Procedure: COLONOSCOPY WITH PROPOFOL;  Surgeon: Manya Silvas, MD;  Location: [facility] ENDOSCOPY;  Service: Endoscopy;  Laterality: N/A;  . COLONOSCOPY WITH PROPOFOL N/A 11/09/2018   Procedure: COLONOSCOPY WITH PROPOFOL;  Surgeon: Manya Silvas, MD;  Location: [facility] ENDOSCOPY;  Service: Endoscopy;  Laterality: N/A;  . ESOPHAGOGASTRODUODENOSCOPY (EGD) WITH PROPOFOL N/A 02/02/2018   Procedure: ESOPHAGOGASTRODUODENOSCOPY (EGD) WITH PROPOFOL;  Surgeon: Manya Silvas, MD;  Location: [facility] ENDOSCOPY;  Service: Endoscopy;  Laterality: N/A;  . JOINT REPLACEMENT     bilateral knee replacements  . KNEE ARTHROSCOPY  Arthroscopic left knee surgery   . KNEE SURGERY  status post knee surgey   . LEFT HEART CATH AND CORONARY ANGIOGRAPHY Left 05/14/2018   Procedure: LEFT HEART CATH AND CORONARY ANGIOGRAPHY;  Surgeon: Corey Skains, MD;  Location: [place] CV LAB;  Service: Cardiovascular;  Laterality: Left;  . REPLACEMENT TOTAL KNEE  ([facility])  . SHOULDER SURGERY  shoulder operation secondary to a torn tendon    Family History  Problem Relation Age of Onset  . Other Mother        Hit by a fire truck and has had multiple operations on her back , and has history of MVP   . Mitral valve prolapse Mother   . Lung cancer Mother   . Heart disease Father        myocardial infarction and is status post bypass surgery  . Mitral valve prolapse Sister   . Hepatitis C Brother   . Cirrhosis Brother   . Colon cancer Paternal Aunt     SOCIAL HX: reviewed.     Current Outpatient Medications:  .  ADVAIR DISKUS 250-50 MCG/DOSE AEPB, INHALE 1 PUFF 2 TIMES A DAY. RINSE MOUTH AND SPIT AFTER EACH USE., Disp: 180 each, Rfl: 0 .  amLODipine (NORVASC) 10 MG tablet, Take 1 tablet (10 mg total) by mouth daily., Disp: 90 tablet, Rfl: 2 .  aspirin 81 MG tablet, Take 81 mg by mouth daily. , Disp: , Rfl:  .  atorvastatin (LIPITOR) 20 MG tablet, TAKE ONE TABLET BY MOUTH EVERY EVENING, Disp: 90 tablet, Rfl: 0 .  Brexpiprazole (REXULTI) 2 MG TABS, Take 2 mg by mouth daily., Disp: , Rfl:  .  CALCIUM PO, Take 1 tablet by mouth daily., Disp: , Rfl:  .  clopidogrel (PLAVIX) 75 MG tablet, TAKE ONE TABLET BY MOUTH EVERY DAY, Disp: 90 tablet, Rfl: 3 .  colestipol (COLESTID) 1 g tablet, Take 3 g by mouth daily. , Disp: , Rfl:  .  cyclobenzaprine (FLEXERIL) 10 MG tablet, TAKE ONE TABLET BY MOUTH 3 TIMES A DAY AS NEEDED FOR MUSCLE SPASMS, Disp: 90 tablet, Rfl: 1 .  DULoxetine (CYMBALTA) 60 MG capsule, Take 60 mg by mouth daily., Disp: , Rfl:  .  escitalopram (LEXAPRO) 10 MG tablet, Take 15 mg by mouth daily., Disp: , Rfl:  .  hyoscyamine (LEVSIN SL) 0.125 MG SL tablet, Place 0.125 mg under the tongue every 4 (four) hours as needed for cramping (diarrhea). , Disp: , Rfl:  .  isosorbide mononitrate (IMDUR) 30 MG 24 hr tablet, Take 30 mg by mouth daily. , Disp: , Rfl:  .  liraglutide (VICTOZA) 18 MG/3ML SOPN, Inject 0.6 mg subcutaneously daily for 1 week, then increase to 1.2 mg daily for 1 week, then increase to 1.8 mg daily thereafter. (Patient taking differently: Inject 1.8 mg into the skin daily. ), Disp: 1 pen, Rfl: 0 .  Loperamide HCl (IMODIUM A-D PO), Take 2 tablets by mouth daily., Disp: , Rfl:  .  metFORMIN (GLUCOPHAGE-XR) 500 MG 24 hr tablet, TAKE TWO TABLETS (1000MG) BY MOUTH 2 TIMES A DAY, Disp: 360 tablet, Rfl: 0 .  Multiple Vitamin (MULTIVITAMIN PO), Take 1 tablet by mouth daily., Disp: , Rfl:  .  mupirocin ointment (BACTROBAN) 2 %, Apply to affected area bid (Patient  not taking: Reported on 06/17/2019), Disp: 22 g, Rfl: 0 .  nystatin (NYSTATIN) powder, APPLY TOPICALLY 2 TIMES A DAY (Patient taking differently: Apply topically 2 (two) times daily as needed (yeast infection). ), Disp: 15 g, Rfl: 0 .  nystatin cream (MYCOSTATIN), APPLY ONE APPLICATION TOPICALLY 2 TIMES A DAY (Patient taking differently: Apply 1 application topically 2 (two) times daily as needed (yeast infection). ), Disp: 30 g, Rfl: 0 .  pantoprazole (PROTONIX) 40 MG tablet, TAKE ONE TABLET BY MOUTH TWO TIMES DAILY BEFORE A MEAL., Disp: 90 tablet, Rfl: 0 .  Probiotic Product (PROBIOTIC DAILY PO), Take 1 tablet by mouth daily., Disp: , Rfl:  .  propranolol (INDERAL) 40 MG tablet, TAKE ONE TABLET BY MOUTH 2 TIMES A DAY, Disp: 180 tablet, Rfl: 0 .  sucralfate (CARAFATE) 1 g tablet, Take 1 g by mouth 2 (two) times daily before a meal. , Disp: , Rfl:  .  telmisartan (MICARDIS) 40 MG tablet, Take 1 tablet (40 mg total) by mouth daily., Disp: 90 tablet, Rfl: 1 .  traZODone (DESYREL) 100 MG tablet, Take 200 mg by mouth at bedtime. , Disp: , Rfl:  .  VENTOLIN HFA 108 (90 Base) MCG/ACT inhaler, INHALE 2 PUFFS FOUR TIMES A DAY, Disp: 54 g, Rfl: 0  EXAM:  VITALS per patient if applicable: 947/09, 90  GENERAL: alert, oriented, appears well and in no acute distress  HEENT: atraumatic, conjunttiva clear, no obvious abnormalities on inspection of external nose and ears  NECK: normal movements of the head and neck  LUNGS: on inspection no signs of respiratory distress, breathing rate appears normal, no obvious gross SOB, gasping or wheezing  CV: no obvious cyanosis  MS: moves all visible extremities without noticeable abnormality  PSYCH/NEURO: pleasant and cooperative, no obvious depression or anxiety, speech and thought processing grossly intact  ASSESSMENT AND PLAN:  Discussed the following assessment and plan:  CAD (coronary artery disease) S/p stent placement.  Followed by cardiology.  Overall  stable now.  Follow.    COPD (chronic obstructive pulmonary disease) Breathing stable.    Depression Stable.  On lexapro.    Diabetes Low carb diet and exercise.  Follow met b and a1c.   Diarrhea Followed by GI.  Taking colestipol and imodium.  Bowls better.   Dysphagia Seeing GI.  Planning for EGD and colonoscopy 22/2020.    Essential hypertension, benign Blood pressure has been under better control.  Blood pressure as outlined.  Follow pressures.  Follow metabolic panel.    GERD (gastroesophageal reflux disease) Continue protonix.   Hypercholesterolemia On lipitor.  Low cholesterol diet and exercise.  Follow lipid panel and liver function tests.    Obstructive sleep apnea CPAP.     I discussed the assessment and treatment plan with the patient. The patient was provided an opportunity to ask questions and all were answered. The patient agreed with the plan and demonstrated an understanding of the instructions.   The patient was advised to call back or seek an in-person evaluation if the symptoms worsen or if the condition fails to improve as anticipated.  I provided 14 minutes of non-face-to-face time during this encounter.   Einar Pheasant, MD

## 2019-09-15 ENCOUNTER — Encounter: Payer: Self-pay | Admitting: Internal Medicine

## 2019-09-15 ENCOUNTER — Telehealth: Payer: Self-pay | Admitting: Pharmacist

## 2019-09-15 NOTE — Assessment & Plan Note (Signed)
CPAP.  

## 2019-09-15 NOTE — Assessment & Plan Note (Signed)
S/p stent placement.  Followed by cardiology.  Overall stable now.  Follow.

## 2019-09-15 NOTE — Assessment & Plan Note (Signed)
Low carb diet and exercise.  Follow met b and a1c.  

## 2019-09-15 NOTE — Assessment & Plan Note (Signed)
Breathing stable.

## 2019-09-15 NOTE — Assessment & Plan Note (Signed)
Followed by GI.  Taking colestipol and imodium.  Bowls better.

## 2019-09-15 NOTE — Assessment & Plan Note (Signed)
Continue protonix  

## 2019-09-15 NOTE — Assessment & Plan Note (Signed)
Seeing GI.  Planning for EGD and colonoscopy 22/2020.

## 2019-09-15 NOTE — Assessment & Plan Note (Signed)
On lipitor.  Low cholesterol diet and exercise.  Follow lipid panel and liver function tests.   

## 2019-09-15 NOTE — Assessment & Plan Note (Signed)
Blood pressure has been under better control.  Blood pressure as outlined.  Follow pressures.  Follow metabolic panel.

## 2019-09-15 NOTE — Assessment & Plan Note (Signed)
Stable.  On lexapro.  ?

## 2019-09-15 NOTE — Telephone Encounter (Signed)
09/15/2019 2:03:22 PM - Refill online with [facility] for Ventolin & Advair  09/15/2019 Placed refill online with [facility] for Ventolin & Advair 250/50, to ship 10/05/2019, order# [id number].Delos Haring

## 2019-09-20 ENCOUNTER — Other Ambulatory Visit: Payer: Self-pay | Admitting: Internal Medicine

## 2019-09-22 ENCOUNTER — Other Ambulatory Visit: Payer: Self-pay | Admitting: Internal Medicine

## 2019-09-23 ENCOUNTER — Telehealth: Payer: Self-pay | Admitting: Pharmacist

## 2019-09-23 NOTE — Telephone Encounter (Signed)
09/23/2019 3:49:06 PM - Victoza & tips refill to [facility]  09/23/2019 [facility] refill for Victoza inject 1.8mg  once daily #4 boxes & tips.Delos Haring

## 2019-09-27 ENCOUNTER — Other Ambulatory Visit: Payer: Self-pay | Admitting: Internal Medicine

## 2019-10-15 ENCOUNTER — Other Ambulatory Visit: Payer: Self-pay | Admitting: Internal Medicine

## 2019-10-15 DIAGNOSIS — I779 Disorder of arteries and arterioles, unspecified: Secondary | ICD-10-CM | POA: Insufficient documentation

## 2019-10-15 DIAGNOSIS — I6523 Occlusion and stenosis of bilateral carotid arteries: Secondary | ICD-10-CM | POA: Insufficient documentation

## 2019-10-19 ENCOUNTER — Encounter: Payer: Self-pay | Admitting: *Deleted

## 2019-10-20 ENCOUNTER — Other Ambulatory Visit: Payer: Self-pay

## 2019-10-20 ENCOUNTER — Other Ambulatory Visit
Admission: RE | Admit: 2019-10-20 | Discharge: 2019-10-20 | Disposition: A | Discharge status: Home / Self Care | Payer: Self-pay | Source: Ambulatory Visit | Attending: Internal Medicine | Admitting: Internal Medicine

## 2019-10-20 DIAGNOSIS — Z01812 Encounter for preprocedural laboratory examination: Secondary | ICD-10-CM | POA: Insufficient documentation

## 2019-10-20 DIAGNOSIS — Z20828 Contact with and (suspected) exposure to other viral communicable diseases: Secondary | ICD-10-CM | POA: Insufficient documentation

## 2019-10-20 LAB — SARS CORONAVIRUS 2 (TAT 6-24 HRS): SARS Coronavirus 2: NEGATIVE

## 2019-10-22 ENCOUNTER — Other Ambulatory Visit: Payer: Self-pay

## 2019-10-25 ENCOUNTER — Encounter: Payer: Self-pay | Admitting: Certified Registered Nurse Anesthetist

## 2019-10-25 ENCOUNTER — Ambulatory Visit: Payer: Self-pay | Admitting: Anesthesiology

## 2019-10-25 ENCOUNTER — Other Ambulatory Visit: Payer: Self-pay

## 2019-10-25 ENCOUNTER — Ambulatory Visit
Admission: RE | Admit: 2019-10-25 | Discharge: 2019-10-25 | Disposition: A | Discharge status: Home / Self Care | Payer: Self-pay | Attending: Internal Medicine | Admitting: Internal Medicine

## 2019-10-25 ENCOUNTER — Encounter
Admission: RE | Disposition: A | Discharge status: Home / Self Care | Payer: Self-pay | Source: Home / Self Care | Attending: Internal Medicine

## 2019-10-25 DIAGNOSIS — Z5309 Procedure and treatment not carried out because of other contraindication: Secondary | ICD-10-CM | POA: Insufficient documentation

## 2019-10-25 DIAGNOSIS — R197 Diarrhea, unspecified: Secondary | ICD-10-CM | POA: Insufficient documentation

## 2019-10-25 DIAGNOSIS — R109 Unspecified abdominal pain: Secondary | ICD-10-CM | POA: Insufficient documentation

## 2019-10-25 HISTORY — DX: Dysphagia, unspecified: R13.10

## 2019-10-25 HISTORY — DX: Dizziness and giddiness: R42

## 2019-10-25 HISTORY — DX: Supraventricular tachycardia, unspecified: I47.10

## 2019-10-25 HISTORY — DX: Melena: K92.1

## 2019-10-25 HISTORY — DX: Headache, unspecified: R51.9

## 2019-10-25 HISTORY — DX: Diverticulosis of intestine, part unspecified, without perforation or abscess without bleeding: K57.90

## 2019-10-25 HISTORY — DX: Venous insufficiency (chronic) (peripheral): I87.2

## 2019-10-25 HISTORY — DX: Lower abdominal pain, unspecified: R10.30

## 2019-10-25 HISTORY — DX: Intra-abdominal and pelvic swelling, mass and lump, unspecified site: R19.00

## 2019-10-25 HISTORY — DX: Interstitial pulmonary disease, unspecified: J84.9

## 2019-10-25 HISTORY — DX: Supraventricular tachycardia: I47.1

## 2019-10-25 LAB — GLUCOSE, CAPILLARY: Glucose-Capillary: 124 mg/dL — ABNORMAL HIGH (ref 70–99)

## 2019-10-25 SURGERY — ESOPHAGOGASTRODUODENOSCOPY (EGD) WITH PROPOFOL
Anesthesia: General

## 2019-10-25 MED ORDER — SODIUM CHLORIDE 0.9 % IV SOLN: INTRAVENOUS | Status: DC

## 2019-10-25 MED ORDER — PROPOFOL 500 MG/50ML IV EMUL
INTRAVENOUS | Status: AC
  Filled 2019-10-25: qty 50

## 2019-10-25 NOTE — Progress Notes (Signed)
Pt took Plavix 10/24/19. MD canceled procedure.

## 2019-10-25 NOTE — Anesthesia Preprocedure Evaluation (Addendum)
Anesthesia Evaluation  Patient identified by MRN, date of birth, ID band Patient awake    Reviewed: Allergy & Precautions, H&P , NPO status , Patient's Chart, lab work & pertinent test results  Airway        Dental   Pulmonary asthma , sleep apnea , COPD, Current Smoker,           Cardiovascular hypertension, + CAD    Echo 08/09/19: NORMAL LEFT VENTRICULAR SYSTOLIC FUNCTION  WITH MILD LVH NORMAL RIGHT VENTRICULAR SYSTOLIC FUNCTION NO VALVULAR STENOSIS MILD MR, TR EF 55%  LHC 05/14/18: The patient has had progressive canadian class 3 anginal symptoms with a high probability stress test with risk factors including diabetes, high blood pressure, high cholesterol and smoking.  With inferior myocardial perfusion defect  normal left ventricular function with ejection fraction of 55%  severe 1 vessel coronary artery disease   There is significant stenosis of right coronary artery with significant diffuse coronary artery atherosclerosis throughout the entire length of the right coronary artery including proximal and mid disease throughout prior stent Diffuse and significant occlusion of entire distal right coronary artery with collaterals to PDA and PL from left septal  Plan Continue medical management of CAD risk factors, Additional medications for management of angina and No further cardiac intervention at this time Patient will continue on antiplatelet medication management isosorbide beta-blocker calcium channel blocker and high intensity cholesterol therapy She has been instructed to stop smoking   Neuro/Psych  Headaches, PSYCHIATRIC DISORDERS Depression    GI/Hepatic Neg liver ROS, GERD  Controlled,  Endo/Other  diabetes  Renal/GU negative Renal ROS  negative genitourinary   Musculoskeletal   Abdominal   Peds  Hematology negative hematology ROS (+)   Anesthesia Other Findings Past Medical History: No date:  Anemia No date: Arthritis     Comment:  back and knees No date: Asthma No date: Blood in stool No date: Chronic diarrhea No date: COPD (chronic obstructive pulmonary disease) (HCC) No date: Coronary artery disease     Comment:  s/p stent placement 06/25/06 No date: Depression     Comment:  secondary to the death of her husband (died 30) Diagnosed Jul 10, 2008: Diabetes mellitus without complication ([place]) No date: Diverticulitis No date: Diverticulosis No date: Dizzinesses No date: Dysphagia No date: Fatty infiltration of liver No date: GERD (gastroesophageal reflux disease) No date: Headache No date: Hypertension No date: Hypertriglyceridemia No date: ILD (interstitial lung disease) (HCC) No date: Lower abdominal pain No date: Lump in the abdomen No date: PSVT (paroxysmal supraventricular tachycardia) (HCC) No date: Sleep apnea     Comment:  on CPAP No date: Spastic colon No date: Tobacco abuse No date: Venous insufficiency of both lower extremities  Past Surgical History: with left ovary in place 1996: [place]: APPENDECTOMY 10/14/2017: BREAST BIOPSY; Left     Comment:  calcs bx, fibrosis giant cell reaction and chronic               inflammation, negative for malignancy.  2007: CARDIAC CATHETERIZATION     Comment:  stents 1984: [place]: CHOLECYSTECTOMY 09/13/2016: COLONOSCOPY WITH PROPOFOL; N/A     Comment:  Procedure: COLONOSCOPY WITH PROPOFOL;  Surgeon: Manya Silvas, MD;  Location: [facility] ENDOSCOPY;  Service:               Endoscopy;  Laterality: N/A; 11/09/2018: COLONOSCOPY WITH PROPOFOL; N/A     Comment:  Procedure:  COLONOSCOPY WITH PROPOFOL;  Surgeon: Manya Silvas, MD;  Location: [facility] ENDOSCOPY;  Service:               Endoscopy;  Laterality: N/A; 02/02/2018: ESOPHAGOGASTRODUODENOSCOPY (EGD) WITH PROPOFOL; N/A     Comment:  Procedure: ESOPHAGOGASTRODUODENOSCOPY (EGD) WITH               PROPOFOL;   Surgeon: Manya Silvas, MD;  Location:               [facility] ENDOSCOPY;  Service: Endoscopy;  Laterality: N/A; No date: JOINT REPLACEMENT     Comment:  bilateral knee replacements Arthroscopic left knee surgery : KNEE ARTHROSCOPY status post knee surgey : KNEE SURGERY 05/14/2018: LEFT HEART CATH AND CORONARY ANGIOGRAPHY; Left     Comment:  Procedure: LEFT HEART CATH AND CORONARY ANGIOGRAPHY;                Surgeon: Corey Skains, MD;  Location: [place]               CV LAB;  Service: Cardiovascular;  Laterality: Left; ([facility]): REPLACEMENT TOTAL KNEE shoulder operation secondary to a torn tendon: SHOULDER SURGERY     Reproductive/Obstetrics negative OB ROS                            Anesthesia Physical Anesthesia Plan  ASA: III  Anesthesia Plan: General   Post-op Pain Management:    Induction:   PONV Risk Score and Plan: Propofol infusion and TIVA  Airway Management Planned: Natural Airway and Nasal Cannula  Additional Equipment:   Intra-op Plan:   Post-operative Plan:   Informed Consent: I have reviewed the patients History and Physical, chart, labs and discussed the procedure including the risks, benefits and alternatives for the proposed anesthesia with the patient or authorized representative who has indicated his/her understanding and acceptance.     Dental Advisory Given  Plan Discussed with: Anesthesiologist  Anesthesia Plan Comments:         Anesthesia Quick Evaluation

## 2019-11-22 ENCOUNTER — Other Ambulatory Visit: Payer: Self-pay | Admitting: Internal Medicine

## 2019-11-29 ENCOUNTER — Other Ambulatory Visit: Payer: Self-pay | Admitting: Internal Medicine

## 2019-12-06 ENCOUNTER — Other Ambulatory Visit: Payer: Self-pay | Admitting: Internal Medicine

## 2019-12-14 ENCOUNTER — Other Ambulatory Visit: Payer: Self-pay | Admitting: Internal Medicine

## 2019-12-23 ENCOUNTER — Telehealth: Payer: Self-pay | Admitting: Pharmacy Technician

## 2019-12-23 NOTE — Telephone Encounter (Signed)
Received 2021 proof of income.  Patient eligible to receive medication assistance at [facility] until 4/1/2.  Patient will be eligible to sign-up for Medicare.  Patient provided with information about how to be screened for the Medicare Savings Program and L.I.S. (Low Income Subsidy).  [facility]

## 2019-12-27 ENCOUNTER — Telehealth: Payer: Self-pay | Admitting: Pharmacist

## 2019-12-27 NOTE — Telephone Encounter (Signed)
12/27/2019 11:21:19 AM - Victoza & tips refill to provider  12/27/2019 Mailing Dr. Nicki Reaper forms to sign for refills on Victoza Inject 1.8mg  once daily #4 boxes & Novofine 32G tips #2 boxes.Delos Haring

## 2019-12-28 ENCOUNTER — Telehealth: Payer: Self-pay | Admitting: Internal Medicine

## 2019-12-28 NOTE — Progress Notes (Signed)
Patient pre-screened for BCCCP eligibility due to COVID 19 precautions. Two patient identifiers used for verification that I was speaking to correct patient.  Patient to Present directly to [facility] 12/29/19 for BCCCP screening mammogram.

## 2019-12-28 NOTE — Telephone Encounter (Signed)
Kathryn Friedman [phone] called [facility] mail order to follow up on 16 medications that was faxed it over on 12/20/2019. Please advise and thank you!  clopidogrel (PLAVIX) 75 MG tablet amLODipine (NORVASC) 10 MG tablet metFORMIN (GLUCOPHAGE-XR) 500 MG 24 hr tablet pantoprazole (PROTONIX) 40 MG tablet propranolol (INDERAL) 40 MG tablet telmisartan (MICARDIS) 40 MG tablet isosorbide mononitrate (IMDUR) 30 MG 24 hr tablet(Expired) atorvastatin (LIPITOR) 20 MG tablet cyclobenzaprine (FLEXERIL) 10 MG tablet VENTOLIN HFA 108 (90 Base) MCG/ACT inhaler ADVAIR DISKUS 250-50 MCG/DOSE AEPB Accucheck meter Accucheck test trips and lancets Accucheck solution BD signal use swaps

## 2019-12-29 ENCOUNTER — Ambulatory Visit
Admission: RE | Admit: 2019-12-29 | Discharge: 2019-12-29 | Disposition: A | Discharge status: Home / Self Care | Payer: Medicaid Other | Source: Ambulatory Visit | Attending: Oncology | Admitting: Oncology

## 2019-12-29 ENCOUNTER — Ambulatory Visit: Payer: Medicaid Other | Attending: Oncology

## 2019-12-29 ENCOUNTER — Other Ambulatory Visit: Payer: Self-pay

## 2019-12-29 DIAGNOSIS — R920 Mammographic microcalcification found on diagnostic imaging of breast: Secondary | ICD-10-CM

## 2019-12-30 ENCOUNTER — Telehealth: Payer: Self-pay | Admitting: Internal Medicine

## 2019-12-30 ENCOUNTER — Other Ambulatory Visit: Payer: Self-pay

## 2019-12-30 NOTE — Telephone Encounter (Signed)
See other note

## 2019-12-30 NOTE — Telephone Encounter (Signed)
Patient stated that her insurance was not supposed to take effect until April. Will call [employer] to confirm before sending in medication

## 2019-12-30 NOTE — Telephone Encounter (Signed)
Pt was returning your call.

## 2019-12-30 NOTE — Telephone Encounter (Signed)
LMTCB.  Patient normally gets medication through medication management. Need to confirm she wants sent to [facility]

## 2020-01-03 ENCOUNTER — Telehealth: Payer: Self-pay | Admitting: *Deleted

## 2020-01-03 DIAGNOSIS — Z87891 Personal history of nicotine dependence: Secondary | ICD-10-CM

## 2020-01-03 NOTE — Telephone Encounter (Signed)
Patient called about the status of the note below, please call patient.

## 2020-01-03 NOTE — Telephone Encounter (Signed)
Patient has been notified that annual lung cancer screening low dose CT scan is due currently or will be in near future. Confirmed that patient is within the age range of 55-77, and asymptomatic, (no signs or symptoms of lung cancer). Patient denies illness that would prevent curative treatment for lung cancer if found. Verified smoking history, (current, 72 pack year). The shared decision making visit was done 08/13/16. Patient is agreeable for CT scan being scheduled.

## 2020-01-04 NOTE — Telephone Encounter (Signed)
Patient stated her insurance does not start until April and she does not want her medications sent to [facility] until then.

## 2020-01-11 ENCOUNTER — Other Ambulatory Visit: Payer: Self-pay

## 2020-01-19 ENCOUNTER — Telehealth: Payer: Self-pay | Admitting: Pharmacist

## 2020-01-19 NOTE — Telephone Encounter (Signed)
01/19/2020 8:27:31 AM - Victoza & tips refill to [facility] - Wednesday, January 19, 2020 8:26 AM -- I have faxed Novo Nordisk refill for Victoza injet 1.8mg  once daily #4 boxes & Novofine 32G tips #2 boxes.

## 2020-01-21 ENCOUNTER — Encounter: Payer: Self-pay | Admitting: Internal Medicine

## 2020-01-27 ENCOUNTER — Other Ambulatory Visit: Payer: Self-pay

## 2020-01-27 ENCOUNTER — Ambulatory Visit
Admission: RE | Admit: 2020-01-27 | Discharge: 2020-01-27 | Disposition: A | Discharge status: Home / Self Care | Payer: Self-pay | Source: Ambulatory Visit | Attending: Nurse Practitioner | Admitting: Nurse Practitioner

## 2020-01-27 ENCOUNTER — Telehealth: Payer: Self-pay | Admitting: Internal Medicine

## 2020-01-27 DIAGNOSIS — Z87891 Personal history of nicotine dependence: Secondary | ICD-10-CM | POA: Insufficient documentation

## 2020-01-27 NOTE — Telephone Encounter (Signed)
Pt called wanting to talk about her Medications and if Dr. Nicki Reaper would take over filling her anxiety medication

## 2020-01-27 NOTE — Telephone Encounter (Signed)
Pt stated that she would like to talk with Dr Nicki Reaper about her medications and a couple things- did not disclose with me. Requested phone visit. Scheduled for 3/9.

## 2020-01-31 ENCOUNTER — Other Ambulatory Visit: Payer: Self-pay | Admitting: Internal Medicine

## 2020-02-01 ENCOUNTER — Ambulatory Visit (INDEPENDENT_AMBULATORY_CARE_PROVIDER_SITE_OTHER): Payer: Medicaid Other | Admitting: Internal Medicine

## 2020-02-01 ENCOUNTER — Other Ambulatory Visit: Payer: Self-pay

## 2020-02-01 DIAGNOSIS — F32A Depression, unspecified: Secondary | ICD-10-CM

## 2020-02-01 DIAGNOSIS — D649 Anemia, unspecified: Secondary | ICD-10-CM

## 2020-02-01 DIAGNOSIS — I2510 Atherosclerotic heart disease of native coronary artery without angina pectoris: Secondary | ICD-10-CM

## 2020-02-01 DIAGNOSIS — E7800 Pure hypercholesterolemia, unspecified: Secondary | ICD-10-CM

## 2020-02-01 DIAGNOSIS — E1159 Type 2 diabetes mellitus with other circulatory complications: Secondary | ICD-10-CM | POA: Diagnosis not present

## 2020-02-01 DIAGNOSIS — J449 Chronic obstructive pulmonary disease, unspecified: Secondary | ICD-10-CM | POA: Diagnosis not present

## 2020-02-01 DIAGNOSIS — F320 Major depressive disorder, single episode, mild: Secondary | ICD-10-CM

## 2020-02-01 DIAGNOSIS — J849 Interstitial pulmonary disease, unspecified: Secondary | ICD-10-CM

## 2020-02-01 DIAGNOSIS — R350 Frequency of micturition: Secondary | ICD-10-CM

## 2020-02-01 DIAGNOSIS — I10 Essential (primary) hypertension: Secondary | ICD-10-CM

## 2020-02-01 DIAGNOSIS — R197 Diarrhea, unspecified: Secondary | ICD-10-CM

## 2020-02-01 DIAGNOSIS — K219 Gastro-esophageal reflux disease without esophagitis: Secondary | ICD-10-CM

## 2020-02-01 DIAGNOSIS — D509 Iron deficiency anemia, unspecified: Secondary | ICD-10-CM

## 2020-02-01 DIAGNOSIS — Z87891 Personal history of nicotine dependence: Secondary | ICD-10-CM

## 2020-02-01 NOTE — Progress Notes (Signed)
Patient ID: Kathryn Friedman, female   DOB: 1956-11-25, 64 y.o.   MRN: [id number]   Virtual Visit via telephone Note  This visit type was conducted due to national recommendations for restrictions regarding the COVID-19 pandemic (e.g. social distancing).  This format is felt to be most appropriate for this patient at this time.  All issues noted in this document were discussed and addressed.  No physical exam was performed (except for noted visual exam findings with Video Visits).   I connected with Kathryn Friedman by telephone and verified that I am speaking with the correct person using two identifiers. Location patient: home Location provider: work  Persons participating in the virtual visit: patient, provider  The limitations, risks, security and privacy concerns of performing an evaluation and management service by telephone and the availability of in person appointments have been discussed.  The patient expressed understanding and agreed to proceed.   Reason for visit: scheduled follow up  HPI: She reports she is doing relatively well.  Has been seeing Dr A at [facility].  States they will be out of her network in 02/2020.  They have had her maintained on cymbalta, lexapro, trazodone and rexulti.  She has done well on this regimen.  States she will not be able to afford to stay on the medication. Discussed the need with her to find a new psychiatrist.  She plans to call Nira Conn (who used to counsel her).  She is now at [facility].  She is also off colestipol.  States [facility] to get it [facility].  Discussed trying another pharmacy.  States since being off, she does have some flares with her bowels, but overall is better.  Taking imodium.  States may have 1-2 flares per week.  Eating.  No chest pain.  Breathing stable.  States blood pressure 138/72.  Reports blood sugars in 130s.  Still smoking.  States smokes one ppd.  Desires not to quit at this time.  She request  referral to Dr Erlene Quan.  States she is having increased urinary frequency and can't hold her urine as well.  Discussed exercises, emptying bladder, etc.     ROS: See pertinent positives and negatives per HPI.  Past Medical History:  Diagnosis Date  . Anemia   . Arthritis    back and knees  . Asthma   . Blood in stool   . Chronic diarrhea   . COPD (chronic obstructive pulmonary disease) ([place])   . Coronary artery disease    s/p stent placement 06/25/06  . Depression    secondary to the death of her husband (died 75)  . Diabetes mellitus without complication ([place]) Diagnosed 05/2008  . Diverticulitis   . Diverticulosis   . Dizzinesses   . Dysphagia   . Fatty infiltration of liver   . GERD (gastroesophageal reflux disease)   . Headache   . Hypertension   . Hypertriglyceridemia   . ILD (interstitial lung disease) ([place])   . Lower abdominal pain   . Lump in the abdomen   . PSVT (paroxysmal supraventricular tachycardia) ([place])   . Sleep apnea    on CPAP  . Spastic colon   . Tobacco abuse   . Venous insufficiency of both lower extremities     Past Surgical History:  Procedure Laterality Date  . ABDOMINAL HYSTERECTOMY  with left ovary in place 1996  . APPENDECTOMY  1985  . BREAST BIOPSY Left 10/14/2017   calcs bx, fibrosis giant cell reaction and  chronic inflammation, negative for malignancy.   [place] CARDIAC CATHETERIZATION  2007   stents  . CESAREAN SECTION  1984  . CHOLECYSTECTOMY  1985  . COLONOSCOPY WITH PROPOFOL N/A 09/13/2016   Procedure: COLONOSCOPY WITH PROPOFOL;  Surgeon: Manya Silvas, MD;  Location: [facility] ENDOSCOPY;  Service: Endoscopy;  Laterality: N/A;  . COLONOSCOPY WITH PROPOFOL N/A 11/09/2018   Procedure: COLONOSCOPY WITH PROPOFOL;  Surgeon: Manya Silvas, MD;  Location: [facility] ENDOSCOPY;  Service: Endoscopy;  Laterality: N/A;  . ESOPHAGOGASTRODUODENOSCOPY (EGD) WITH PROPOFOL N/A 02/02/2018   Procedure: ESOPHAGOGASTRODUODENOSCOPY (EGD) WITH PROPOFOL;  Surgeon:  Manya Silvas, MD;  Location: [facility] ENDOSCOPY;  Service: Endoscopy;  Laterality: N/A;  . JOINT REPLACEMENT     bilateral knee replacements  . KNEE ARTHROSCOPY  Arthroscopic left knee surgery   . KNEE SURGERY  status post knee surgey   . LEFT HEART CATH AND CORONARY ANGIOGRAPHY Left 05/14/2018   Procedure: LEFT HEART CATH AND CORONARY ANGIOGRAPHY;  Surgeon: Corey Skains, MD;  Location: [place] CV LAB;  Service: Cardiovascular;  Laterality: Left;  . REPLACEMENT TOTAL KNEE  ([facility])  . SHOULDER SURGERY  shoulder operation secondary to a torn tendon    Family History  Problem Relation Age of Onset  . Other Mother        Hit by a fire truck and has had multiple operations on her back , and has history of MVP   . Mitral valve prolapse Mother   . Lung cancer Mother   . Heart disease Father        myocardial infarction and is status post bypass surgery  . Mitral valve prolapse Sister   . Hepatitis C Brother   . Cirrhosis Brother   . Colon cancer Paternal Aunt   . Breast cancer Neg Hx     SOCIAL HX: reviewed.    Current Outpatient Medications:  .  ADVAIR DISKUS 250-50 MCG/DOSE AEPB, INHALE 1 PUFF 2 TIMES A DAY. RINSE MOUTH AND SPIT AFTER EACH USE., Disp: 180 each, Rfl: 1 .  amLODipine (NORVASC) 10 MG tablet, Take 1 tablet by mouth once daily, Disp: 90 tablet, Rfl: 0 .  aspirin 81 MG tablet, Take 81 mg by mouth daily. , Disp: , Rfl:  .  atorvastatin (LIPITOR) 20 MG tablet, TAKE ONE TABLET BY MOUTH EVERY EVENING, Disp: 90 tablet, Rfl: 0 .  Brexpiprazole (REXULTI) 2 MG TABS, Take 2 mg by mouth daily., Disp: , Rfl:  .  CALCIUM PO, Take 1 tablet by mouth daily., Disp: , Rfl:  .  clopidogrel (PLAVIX) 75 MG tablet, TAKE ONE TABLET BY MOUTH EVERY DAY, Disp: 90 tablet, Rfl: 0 .  colestipol (COLESTID) 1 g tablet, Take 3 g by mouth daily. , Disp: , Rfl:  .  cyclobenzaprine (FLEXERIL) 10 MG tablet, TAKE ONE TABLET BY MOUTH 3 TIMES A DAY AS NEEDED FOR MUSCLE SPASMS, Disp: 90 tablet, Rfl:  0 .  DULoxetine (CYMBALTA) 60 MG capsule, Take 60 mg by mouth daily., Disp: , Rfl:  .  escitalopram (LEXAPRO) 10 MG tablet, Take 15 mg by mouth daily., Disp: , Rfl:  .  folic acid (FOLVITE) 1 MG tablet, Take 1 mg by mouth daily., Disp: , Rfl:  .  hyoscyamine (LEVSIN SL) 0.125 MG SL tablet, Place 0.125 mg under the tongue every 4 (four) hours as needed for cramping (diarrhea). , Disp: , Rfl:  .  isosorbide mononitrate (IMDUR) 30 MG 24 hr tablet, Take 30 mg by mouth daily. , Disp: , Rfl:  .  liraglutide (VICTOZA) 18 MG/3ML SOPN, Inject 0.6 mg subcutaneously daily for 1 week, then increase to 1.2 mg daily for 1 week, then increase to 1.8 mg daily thereafter. (Patient taking differently: Inject 1.8 mg into the skin daily. ), Disp: 1 pen, Rfl: 0 .  Loperamide HCl (IMODIUM A-D PO), Take 2 tablets by mouth daily., Disp: , Rfl:  .  metFORMIN (GLUCOPHAGE-XR) 500 MG 24 hr tablet, TAKE TWO TABLETS (1000MG) BY MOUTH 2 TIMES A DAY, Disp: 360 tablet, Rfl: 0 .  Multiple Vitamin (MULTIVITAMIN PO), Take 1 tablet by mouth daily., Disp: , Rfl:  .  mupirocin ointment (BACTROBAN) 2 %, Apply to affected area bid (Patient not taking: Reported on 06/17/2019), Disp: 22 g, Rfl: 0 .  nystatin (NYSTATIN) powder, APPLY TOPICALLY 2 TIMES A DAY (Patient taking differently: Apply topically 2 (two) times daily as needed (yeast infection). ), Disp: 15 g, Rfl: 0 .  nystatin cream (MYCOSTATIN), APPLY ONE APPLICATION TOPICALLY 2 TIMES A DAY (Patient taking differently: Apply 1 application topically 2 (two) times daily as needed (yeast infection). ), Disp: 30 g, Rfl: 0 .  pantoprazole (PROTONIX) 40 MG tablet, TAKE ONE TABLET BY MOUTH TWO TIMES DAILY BEFORE A MEAL., Disp: 120 tablet, Rfl: 0 .  Probiotic Product (PROBIOTIC DAILY PO), Take 1 tablet by mouth daily., Disp: , Rfl:  .  propranolol (INDERAL) 40 MG tablet, TAKE ONE TABLET BY MOUTH 2 TIMES A DAY, Disp: 180 tablet, Rfl: 0 .  sucralfate (CARAFATE) 1 g tablet, Take 1 g by mouth 2  (two) times daily before a meal. , Disp: , Rfl:  .  telmisartan (MICARDIS) 40 MG tablet, TAKE ONE TABLET BY MOUTH EVERY DAY, Disp: 90 tablet, Rfl: 0 .  traZODone (DESYREL) 100 MG tablet, Take 200 mg by mouth at bedtime. , Disp: , Rfl:  .  VENTOLIN HFA 108 (90 Base) MCG/ACT inhaler, INHALE 2 PUFFS FOUR TIMES A DAY, Disp: 54 g, Rfl: 0  EXAM:  VITALS per patient if applicable: 539/76  GENERAL: alert.  Sounds to be in no acute distress.  Answering questions appropriately.    PSYCH/NEURO: pleasant and cooperative, no obvious depression or anxiety, speech and thought processing grossly intact  ASSESSMENT AND PLAN:  Discussed the following assessment and plan:  Anemia Follow cbc.   CAD (coronary artery disease) S/p stent placement.  Followed by cardiology.  Overall stable.  No chest pain or sob.  Continue risk factor modification.  Continue plavix, micardis and lipitor.   COPD (chronic obstructive pulmonary disease) Breathing stable.  Diabetes Low carb diet and exercise.  On metformin.  Sugars as outlined. Follow met b and a1c.   Diarrhea Off colestipol.  Has noticed some intermittent flares.  Was controlled.  Will check another pharmacy for availability.    Essential hypertension, benign Blood pressure under good control.  Continue same medication regimen - on micardis.   Follow pressures.  Follow metabolic panel.    GERD (gastroesophageal reflux disease) Upper symptoms controlled on protonix.    Hypercholesterolemia On lipitor.  Low cholesterol diet and exercise.  Follow lipid panel and liver function tests.    ILD (interstitial lung disease) (HCC) Breathing stable.   Iron deficiency anemia Follow cbc.   Mild depression ([place]) Has been seeing psychiatry - followed by [facility].  Will be out of network. She is on lexapro, trazodone, rexulti and cymbalta.  Discussed the need for scheduled follow up with psychiatry.  Discussed not able to stop her medication.   She is  going to talk with her previous therapist Nira Conn - now at [facility].    Personal history of tobacco use, presenting hazards to health Discussed the importance of quitting.  She declines.  Follow.   Urinary frequency Urinary frequency and inability to hold urine.  Request referral to Dr Erlene Quan.     Orders Placed This Encounter  Procedures  . Ambulatory referral to Urology    Referral Priority:   Routine    Referral Type:   Consultation    Referral Reason:   Specialty Services Required    Requested Specialty:   Urology    Number of Visits Requested:   1     I discussed the assessment and treatment plan with the patient. The patient was provided an opportunity to ask questions and all were answered. The patient agreed with the plan and demonstrated an understanding of the instructions.   The patient was advised to call back or seek an in-person evaluation if the symptoms worsen or if the condition fails to improve as anticipated.   I spent 25 minutes in discussion with this patient during this visit.    Einar Pheasant, MD

## 2020-02-04 ENCOUNTER — Encounter: Payer: Self-pay | Admitting: *Deleted

## 2020-02-06 ENCOUNTER — Encounter: Payer: Self-pay | Admitting: Internal Medicine

## 2020-02-06 DIAGNOSIS — R350 Frequency of micturition: Secondary | ICD-10-CM | POA: Insufficient documentation

## 2020-02-06 NOTE — Assessment & Plan Note (Signed)
Breathing stable.

## 2020-02-06 NOTE — Assessment & Plan Note (Signed)
Upper symptoms controlled on protonix.  

## 2020-02-06 NOTE — Assessment & Plan Note (Signed)
Low carb diet and exercise.  On metformin.  Sugars as outlined.  Follow met b and a1c.  

## 2020-02-06 NOTE — Assessment & Plan Note (Signed)
Blood pressure under good control.  Continue same medication regimen - on micardis.   Follow pressures.  Follow metabolic panel.

## 2020-02-06 NOTE — Assessment & Plan Note (Signed)
Discussed the importance of quitting.  She declines.  Follow.

## 2020-02-06 NOTE — Assessment & Plan Note (Signed)
Follow cbc.  

## 2020-02-06 NOTE — Assessment & Plan Note (Signed)
On lipitor.  Low cholesterol diet and exercise.  Follow lipid panel and liver function tests.   

## 2020-02-06 NOTE — Assessment & Plan Note (Signed)
S/p stent placement.  Followed by cardiology.  Overall stable.  No chest pain or sob.  Continue risk factor modification.  Continue plavix, micardis and lipitor.

## 2020-02-06 NOTE — Assessment & Plan Note (Signed)
Has been seeing psychiatry - followed by [facility].  Will be out of network. She is on lexapro, trazodone, rexulti and cymbalta.  Discussed the need for scheduled follow up with psychiatry.  Discussed not able to stop her medication.  She is going to talk with her previous therapist Nira Conn - now at [facility].

## 2020-02-06 NOTE — Assessment & Plan Note (Signed)
Urinary frequency and inability to hold urine.  Request referral to Dr Erlene Quan.

## 2020-02-06 NOTE — Assessment & Plan Note (Signed)
Off colestipol.  Has noticed some intermittent flares.  Was controlled.  Will check another pharmacy for availability.

## 2020-02-09 ENCOUNTER — Telehealth: Payer: Self-pay | Admitting: Internal Medicine

## 2020-02-09 NOTE — Telephone Encounter (Signed)
Spoke to [employer].  She is currently on medicaid.  She informed me she was changing insurance 02/24/20.I thought she said she was changing to medicare.  If this is correct, I spoke to [facility] and they accept medicare.  If she is keeping medicaid and medicare - no charge.  If she is medicare only, then she would pay 20%.  (this would be 21 dollars per visit).  I would (and [place] would) like for her to continue her care through them since she has done so well.  I have also placed a call to speak with Dr A).  He is to call me back.

## 2020-02-09 NOTE — Telephone Encounter (Signed)
I can send her prescriptions to [facility] but wanted to confirm that you are going to take over filling this for her

## 2020-02-09 NOTE — Telephone Encounter (Signed)
Patient needs a refillescitalopram (LEXAPRO) 10 MG tablet .  Patient goes on Medicare on April 1st. [employer] has been trying to contact office and Larena Glassman has also tried to reach [employer]. Patient would like to know if Larena Glassman ever got in contact with [facility].

## 2020-02-10 NOTE — Telephone Encounter (Signed)
Patient is aware and stated she will call over there and try to schedule. She says they told her they do not take medicare. Advised of message below and that I would call her back if anything further needs to be addressed. Pt understood.

## 2020-02-17 NOTE — Telephone Encounter (Signed)
Pt said that [employer] has called [place] 3 times to get her prescriptions sent to them. She needs all of her prescriptions sent to them as of 02/24/20 because she will be on Medicare then. She said please call [phone] ask for pharmacy.

## 2020-02-18 ENCOUNTER — Other Ambulatory Visit: Payer: Self-pay

## 2020-02-18 MED ORDER — ATORVASTATIN CALCIUM 20 MG PO TABS: 20.0000 mg | ORAL_TABLET | Freq: Every evening | ORAL | 0 refills | Status: DC

## 2020-02-18 MED ORDER — AMLODIPINE BESYLATE 10 MG PO TABS: 10.0000 mg | ORAL_TABLET | Freq: Every day | ORAL | 0 refills | Status: DC

## 2020-02-18 MED ORDER — METFORMIN HCL ER 500 MG PO TB24: 1000.0000 mg | ORAL_TABLET | Freq: Two times a day (BID) | ORAL | 0 refills | Status: DC

## 2020-02-18 MED ORDER — PANTOPRAZOLE SODIUM 40 MG PO TBEC: 40.0000 mg | DELAYED_RELEASE_TABLET | Freq: Two times a day (BID) | ORAL | 0 refills | Status: DC

## 2020-02-18 MED ORDER — TELMISARTAN 40 MG PO TABS: 40.0000 mg | ORAL_TABLET | Freq: Every day | ORAL | 0 refills | Status: DC

## 2020-02-18 MED ORDER — PROPRANOLOL HCL 40 MG PO TABS: ORAL_TABLET | ORAL | 0 refills | Status: DC

## 2020-02-18 MED ORDER — CLOPIDOGREL BISULFATE 75 MG PO TABS: 75.0000 mg | ORAL_TABLET | Freq: Every day | ORAL | 0 refills | Status: DC

## 2020-02-18 MED ORDER — CYCLOBENZAPRINE HCL 10 MG PO TABS: ORAL_TABLET | ORAL | 0 refills | Status: DC

## 2020-02-18 MED ORDER — FLUTICASONE-SALMETEROL 250-50 MCG/DOSE IN AEPB: INHALATION_SPRAY | RESPIRATORY_TRACT | 1 refills | Status: DC

## 2020-02-18 NOTE — Telephone Encounter (Signed)
Medication sent to [facility] mail order

## 2020-02-21 ENCOUNTER — Other Ambulatory Visit: Payer: Self-pay | Admitting: Internal Medicine

## 2020-02-24 ENCOUNTER — Telehealth: Payer: Self-pay | Admitting: Internal Medicine

## 2020-02-24 ENCOUNTER — Other Ambulatory Visit: Payer: Medicaid Other

## 2020-02-24 ENCOUNTER — Telehealth: Payer: Self-pay | Admitting: Pharmacy Technician

## 2020-02-24 NOTE — Telephone Encounter (Signed)
Pt called and said she got the covid vaccine and now her chest is tight, nose is burning, cough, and just feel horrible. I asked if she has any shortness of breath or pain she said no. She moved her lab and cpe out. She would like for her provider to know what is going on.

## 2020-02-24 NOTE — Telephone Encounter (Signed)
Needs to be triaged to confirm nothing more acute going on.

## 2020-02-24 NOTE — Telephone Encounter (Signed)
Patient has Medicare.  No longer meets [facility]'s eligibility criteria.  Patient notified.  [facility]

## 2020-02-24 NOTE — Telephone Encounter (Signed)
Spoke to patient. She recently received Covid Vaccine. The very next day she has been experiencing flu like SX, Cough, fatigue, sinus drainage, sore throat, chest tightness from coughing. No fever, chills, and nausea/vomiting.  Instructed patient to contact [facility] per protocol on the vaccination. If sx worsen she will need to go to UC/ED due to [place].

## 2020-03-02 ENCOUNTER — Encounter: Payer: Self-pay | Admitting: Internal Medicine

## 2020-03-02 ENCOUNTER — Ambulatory Visit (INDEPENDENT_AMBULATORY_CARE_PROVIDER_SITE_OTHER): Payer: Medicare PPO | Admitting: Urology

## 2020-03-02 ENCOUNTER — Other Ambulatory Visit: Payer: Self-pay

## 2020-03-02 ENCOUNTER — Encounter: Payer: Self-pay | Admitting: Urology

## 2020-03-02 VITALS — BP 129/75 | HR 88 | Ht 64.0 in | Wt 206.0 lb

## 2020-03-02 DIAGNOSIS — R350 Frequency of micturition: Secondary | ICD-10-CM | POA: Diagnosis not present

## 2020-03-02 DIAGNOSIS — N3281 Overactive bladder: Secondary | ICD-10-CM

## 2020-03-02 DIAGNOSIS — N3941 Urge incontinence: Secondary | ICD-10-CM | POA: Insufficient documentation

## 2020-03-02 LAB — URINALYSIS, COMPLETE
Bilirubin, UA: NEGATIVE
Glucose, UA: NEGATIVE
Leukocytes,UA: NEGATIVE
Nitrite, UA: NEGATIVE
Protein,UA: NEGATIVE
RBC, UA: NEGATIVE
Specific Gravity, UA: 1.02 (ref 1.005–1.030)
Urobilinogen, Ur: 0.2 mg/dL (ref 0.2–1.0)
pH, UA: 6 (ref 5.0–7.5)

## 2020-03-02 LAB — BLADDER SCAN AMB NON-IMAGING: Scan Result: 0

## 2020-03-02 LAB — MICROSCOPIC EXAMINATION: RBC, Urine: NONE SEEN /hpf (ref 0–2)

## 2020-03-02 MED ORDER — TOLTERODINE TARTRATE ER 4 MG PO CP24: 4.0000 mg | ORAL_CAPSULE | Freq: Every day | ORAL | 1 refills | Status: DC

## 2020-03-02 NOTE — Progress Notes (Signed)
03/02/2020 02:30 PM  Kathryn Friedman 10-27-1956 [id number]  Referring provider: Einar Pheasant, MD [id number] [place],  [place] [id number]  Chief Complaint  Patient presents with  . Urinary Frequency    HPI: Kathryn Friedman is a 64 yo female seen at the request of Dr. Nicki Reaper for urinary frequency and incontinence.  -Has experienced severe frequency, urgency with urge incontinence and nocturia 3-4x for 6 months -Mild stress incontinence -Currently wearing adult diapers -Denies hematuria or UTI -Denies neurologic problems including degenerative disc disease, CVA, MS or Parkinson's -Has had chronic diarrhea for years -Currently complains of constipation -Hx of Hysterectomy and C-section  -PVR is 0 -No previous urologic evaluation or symptom treatment  PMH: Past Medical History:  Diagnosis Date  . Anemia   . Arthritis    back and knees  . Asthma   . Blood in stool   . Chronic diarrhea   . COPD (chronic obstructive pulmonary disease) ([place])   . Coronary artery disease    s/p stent placement 06/25/06  . Depression    secondary to the death of her husband (died 51)  . Diabetes mellitus without complication ([place]) Diagnosed 05/2008  . Diverticulitis   . Diverticulosis   . Dizzinesses   . Dysphagia   . Fatty infiltration of liver   . GERD (gastroesophageal reflux disease)   . Headache   . Hypertension   . Hypertriglyceridemia   . ILD (interstitial lung disease) ([place])   . Lower abdominal pain   . Lump in the abdomen   . PSVT (paroxysmal supraventricular tachycardia) ([place])   . Sleep apnea    on CPAP  . Spastic colon   . Tobacco abuse   . Venous insufficiency of both lower extremities     Surgical History: Past Surgical History:  Procedure Laterality Date  . ABDOMINAL HYSTERECTOMY  with left ovary in place 1996  . APPENDECTOMY  1985  . BREAST BIOPSY Left 10/14/2017   calcs bx, fibrosis giant cell reaction and chronic  inflammation, negative for malignancy.   [facility] CARDIAC CATHETERIZATION  2007   stents  . CESAREAN SECTION  1984  . CHOLECYSTECTOMY  1985  . COLONOSCOPY WITH PROPOFOL N/A 09/13/2016   Procedure: COLONOSCOPY WITH PROPOFOL;  Surgeon: Manya Silvas, MD;  Location: [facility] ENDOSCOPY;  Service: Endoscopy;  Laterality: N/A;  . COLONOSCOPY WITH PROPOFOL N/A 11/09/2018   Procedure: COLONOSCOPY WITH PROPOFOL;  Surgeon: Manya Silvas, MD;  Location: [facility] ENDOSCOPY;  Service: Endoscopy;  Laterality: N/A;  . ESOPHAGOGASTRODUODENOSCOPY (EGD) WITH PROPOFOL N/A 02/02/2018   Procedure: ESOPHAGOGASTRODUODENOSCOPY (EGD) WITH PROPOFOL;  Surgeon: Manya Silvas, MD;  Location: [facility] ENDOSCOPY;  Service: Endoscopy;  Laterality: N/A;  . JOINT REPLACEMENT     bilateral knee replacements  . KNEE ARTHROSCOPY  Arthroscopic left knee surgery   . KNEE SURGERY  status post knee surgey   . LEFT HEART CATH AND CORONARY ANGIOGRAPHY Left 05/14/2018   Procedure: LEFT HEART CATH AND CORONARY ANGIOGRAPHY;  Surgeon: Corey Skains, MD;  Location: [facility] CV LAB;  Service: Cardiovascular;  Laterality: Left;  . REPLACEMENT TOTAL KNEE  ([facility])  . SHOULDER SURGERY  shoulder operation secondary to a torn tendon    Home Medications:  Allergies as of 03/02/2020      Reactions   Chantix [varenicline] Other (See Comments)   "I got really depressed"   Jardiance [empagliflozin] Other (See Comments)   Yeast infection      Medication List  Accurate as of March 02, 2020 10:29 PM. If you have any questions, ask your nurse or doctor.        amLODipine 10 MG tablet Commonly known as: NORVASC Take 1 tablet (10 mg total) by mouth daily.   aspirin 81 MG tablet Take 81 mg by mouth daily.   atorvastatin 20 MG tablet Commonly known as: LIPITOR TAKE ONE TABLET BY MOUTH EVERY EVENING   CALCIUM PO Take 1 tablet by mouth daily.   clopidogrel 75 MG tablet Commonly known as: PLAVIX Take 1 tablet (75 mg total) by mouth  daily.   colestipol 1 g tablet Commonly known as: COLESTID Take 3 g by mouth daily.   cyclobenzaprine 10 MG tablet Commonly known as: FLEXERIL TAKE ONE TABLET BY MOUTH 3 TIMES A DAY AS NEEDED FOR MUSCLE SPASMS   DULoxetine 60 MG capsule Commonly known as: CYMBALTA Take 60 mg by mouth daily.   escitalopram 10 MG tablet Commonly known as: LEXAPRO Take 15 mg by mouth daily.   Fluticasone-Salmeterol 250-50 MCG/DOSE Aepb Commonly known as: Advair Diskus INHALE 1 PUFF 2 TIMES A DAY. RINSE MOUTH AND SPIT AFTER EACH USE.   folic acid 1 MG tablet Commonly known as: FOLVITE Take 1 mg by mouth daily.   hyoscyamine 0.125 MG SL tablet Commonly known as: LEVSIN SL Place 0.125 mg under the tongue every 4 (four) hours as needed for cramping (diarrhea).   IMODIUM A-D PO Take 2 tablets by mouth daily.   isosorbide mononitrate 30 MG 24 hr tablet Commonly known as: IMDUR Take 30 mg by mouth daily.   liraglutide 18 MG/3ML Sopn Commonly known as: Victoza Inject 0.6 mg subcutaneously daily for 1 week, then increase to 1.2 mg daily for 1 week, then increase to 1.8 mg daily thereafter. What changed:   how much to take  how to take this  when to take this  additional instructions   metFORMIN 500 MG 24 hr tablet Commonly known as: GLUCOPHAGE-XR Take 2 tablets (1,000 mg total) by mouth in the morning and at bedtime.   MULTIVITAMIN PO Take 1 tablet by mouth daily.   mupirocin ointment 2 % Commonly known as: Bactroban Apply to affected area bid   nystatin cream Commonly known as: MYCOSTATIN APPLY ONE APPLICATION TOPICALLY 2 TIMES A DAY What changed: See the new instructions.   nystatin powder Commonly known as: nystatin APPLY TOPICALLY 2 TIMES A DAY What changed:   how to take this  when to take this  reasons to take this  additional instructions   pantoprazole 40 MG tablet Commonly known as: PROTONIX Take 1 tablet (40 mg total) by mouth 2 (two) times daily before a  meal.   PROBIOTIC DAILY PO Take 1 tablet by mouth daily.   propranolol 40 MG tablet Commonly known as: INDERAL TAKE ONE TABLET BY MOUTH 2 TIMES A DAY   Rexulti 2 MG Tabs tablet Generic drug: brexpiprazole Take 2 mg by mouth daily.   sucralfate 1 g tablet Commonly known as: CARAFATE Take 1 g by mouth 2 (two) times daily before a meal.   telmisartan 40 MG tablet Commonly known as: MICARDIS Take 1 tablet (40 mg total) by mouth daily.   traZODone 100 MG tablet Commonly known as: DESYREL Take 200 mg by mouth at bedtime.   Ventolin HFA 108 (90 Base) MCG/ACT inhaler Generic drug: albuterol INHALE 2 PUFFS FOUR TIMES A DAY       Allergies:  Allergies  Allergen Reactions  . Chantix [Varenicline]  Other (See Comments)    "I got really depressed"  . Jardiance [Empagliflozin] Other (See Comments)    Yeast infection    Family History: Family History  Problem Relation Age of Onset  . Other Mother        Hit by a fire truck and has had multiple operations on her back , and has history of MVP   . Mitral valve prolapse Mother   . Lung cancer Mother   . Heart disease Father        myocardial infarction and is status post bypass surgery  . Mitral valve prolapse Sister   . Hepatitis C Brother   . Cirrhosis Brother   . Colon cancer Paternal Aunt   . Breast cancer Neg Hx     Social History:  reports that she has been smoking cigarettes. She has a 67.50 pack-year smoking history. She has never used smokeless tobacco. She reports that she does not drink alcohol or use drugs.   Physical Exam: BP 129/75   Pulse 88   Ht 5\' 4"  (1.626 m)   Wt 206 lb (93.4 kg)   BMI 35.36 kg/m   Constitutional:  Alert and oriented, No acute distress. HEENT: [place] AT, moist mucus membranes.  Trachea midline, no masses. Cardiovascular: No clubbing, cyanosis, or edema. Respiratory: Normal respiratory effort, no increased work of breathing. Skin: No rashes, bruises or suspicious lesions. Neurologic:  Grossly intact, no focal deficits, moving all 4 extremities. Psychiatric: Normal mood and affect.   Assessment & Plan:   1. Overactive bladder w/urge incontinence -Idiopathic overactive bladder  -Adequate emptying of bladder -Discussed pelvic floor therapy and medical treatment options -Pt prefered to take medication -Rx tolterodine sent to pharmacy however if not on her Medicare formulary asked her to have her pharmacy contact us for a economical alternative -1 month telephone f/u    Abbie Sons, [place] [address] [place], [place] [address] [phone]  I, Noland Fordyce, am acting as a scribe for Dr. John Giovanni.  I have reviewed the above documentation for accuracy and completeness, and I agree with the above.   Abbie Sons, MD

## 2020-03-03 ENCOUNTER — Telehealth: Payer: Self-pay | Admitting: Urology

## 2020-03-03 NOTE — Telephone Encounter (Signed)
Left patient message to call 

## 2020-03-03 NOTE — Telephone Encounter (Signed)
Pt left vm she was in to see Dr. Bernardo Heater and was given a prescription send to [facility] she states it is costing her $150 and wanted to see if we could call in a cheaper prescription

## 2020-03-06 ENCOUNTER — Telehealth: Payer: Self-pay

## 2020-03-06 NOTE — Telephone Encounter (Signed)
Do we know what the preferred oab med is on her formulary?

## 2020-03-06 NOTE — Telephone Encounter (Signed)
See telephone encounter.

## 2020-03-06 NOTE — Telephone Encounter (Signed)
Patient called to follow up from phone call on Friday ab possibly changing her prescription due to cost (Tolteridine) however patient informed me that she found coupons on [facility] for the prescription and no longer wants to change it.

## 2020-03-07 NOTE — Telephone Encounter (Signed)
Patient was able to use [facility] to get medication at a lower cost

## 2020-03-08 ENCOUNTER — Telehealth: Payer: Self-pay | Admitting: Internal Medicine

## 2020-03-08 MED ORDER — ALBUTEROL SULFATE HFA 108 (90 BASE) MCG/ACT IN AERS: INHALATION_SPRAY | RESPIRATORY_TRACT | 0 refills | Status: DC

## 2020-03-08 NOTE — Telephone Encounter (Signed)
Refill sent to [facility].

## 2020-03-08 NOTE — Addendum Note (Signed)
Addended by: Nanci Pina on: 03/08/2020 12:41 PM   Modules accepted: Orders

## 2020-03-08 NOTE — Telephone Encounter (Signed)
[facility] called to request an RX for albuterol inhaler

## 2020-03-09 MED ORDER — ALBUTEROL SULFATE HFA 108 (90 BASE) MCG/ACT IN AERS: INHALATION_SPRAY | RESPIRATORY_TRACT | 0 refills | Status: DC

## 2020-03-09 NOTE — Telephone Encounter (Signed)
Rx has been sent electronically. 

## 2020-03-09 NOTE — Telephone Encounter (Signed)
Pt states that pharmacy did not receive refill-please resend [phone]

## 2020-03-09 NOTE — Addendum Note (Signed)
Addended byElpidio Galea T on: 03/09/2020 01:15 PM   Modules accepted: Orders

## 2020-03-12 DIAGNOSIS — G4733 Obstructive sleep apnea (adult) (pediatric): Secondary | ICD-10-CM | POA: Diagnosis not present

## 2020-03-17 ENCOUNTER — Other Ambulatory Visit: Payer: Self-pay

## 2020-03-17 ENCOUNTER — Other Ambulatory Visit (INDEPENDENT_AMBULATORY_CARE_PROVIDER_SITE_OTHER): Payer: Medicare PPO

## 2020-03-17 DIAGNOSIS — E1159 Type 2 diabetes mellitus with other circulatory complications: Secondary | ICD-10-CM

## 2020-03-17 DIAGNOSIS — E7800 Pure hypercholesterolemia, unspecified: Secondary | ICD-10-CM | POA: Diagnosis not present

## 2020-03-17 DIAGNOSIS — I10 Essential (primary) hypertension: Secondary | ICD-10-CM | POA: Diagnosis not present

## 2020-03-17 LAB — BASIC METABOLIC PANEL
BUN: 9 mg/dL (ref 6–23)
CO2: 25 mEq/L (ref 19–32)
Calcium: 8.8 mg/dL (ref 8.4–10.5)
Chloride: 103 mEq/L (ref 96–112)
Creatinine, Ser: 0.71 mg/dL (ref 0.40–1.20)
GFR: 82.87 mL/min (ref >60.00)
Glucose, Bld: 145 mg/dL — ABNORMAL HIGH (ref 70–99)
Potassium: 3.7 mEq/L (ref 3.5–5.1)
Sodium: 136 mEq/L (ref 135–145)

## 2020-03-17 LAB — HEPATIC FUNCTION PANEL
ALT: 15 U/L (ref 0–35)
AST: 16 U/L (ref 0–37)
Albumin: 3.7 g/dL (ref 3.5–5.2)
Alkaline Phosphatase: 70 U/L (ref 39–117)
Bilirubin, Direct: 0.1 mg/dL (ref 0.0–0.3)
Total Bilirubin: 0.3 mg/dL (ref 0.2–1.2)
Total Protein: 6.6 g/dL (ref 6.0–8.3)

## 2020-03-17 LAB — TSH: TSH: 1.56 u[IU]/mL (ref 0.35–4.50)

## 2020-03-17 LAB — HEMOGLOBIN A1C: Hgb A1c MFr Bld: 6.1 % (ref 4.6–6.5)

## 2020-03-17 LAB — MICROALBUMIN / CREATININE URINE RATIO
Creatinine,U: 143.1 mg/dL
Microalb Creat Ratio: 0.6 mg/g (ref 0.0–30.0)
Microalb, Ur: 0.9 mg/dL (ref 0.0–1.9)

## 2020-03-17 LAB — LIPID PANEL
Cholesterol: 110 mg/dL (ref 0–200)
HDL: 38.9 mg/dL — ABNORMAL LOW (ref >39.00)
LDL Cholesterol: 47 mg/dL (ref 0–99)
NonHDL: 71.47
Total CHOL/HDL Ratio: 3
Triglycerides: 122 mg/dL (ref 0.0–149.0)
VLDL: 24.4 mg/dL (ref 0.0–40.0)

## 2020-03-20 DIAGNOSIS — K2950 Unspecified chronic gastritis without bleeding: Secondary | ICD-10-CM | POA: Diagnosis not present

## 2020-03-20 DIAGNOSIS — D509 Iron deficiency anemia, unspecified: Secondary | ICD-10-CM | POA: Diagnosis not present

## 2020-03-20 DIAGNOSIS — R1312 Dysphagia, oropharyngeal phase: Secondary | ICD-10-CM | POA: Diagnosis not present

## 2020-03-20 DIAGNOSIS — K529 Noninfective gastroenteritis and colitis, unspecified: Secondary | ICD-10-CM | POA: Diagnosis not present

## 2020-03-21 ENCOUNTER — Encounter: Payer: Self-pay | Admitting: Internal Medicine

## 2020-03-23 NOTE — Progress Notes (Signed)
Letter mailed from [facility] to notify of normal mammogram results.  Patient to return in one year for annual screening.  Copy to [facility]. 

## 2020-03-24 ENCOUNTER — Encounter: Payer: Self-pay | Admitting: Internal Medicine

## 2020-03-24 ENCOUNTER — Other Ambulatory Visit
Admission: RE | Admit: 2020-03-24 | Discharge: 2020-03-24 | Disposition: A | Discharge status: Home / Self Care | Payer: Medicare PPO | Source: Ambulatory Visit | Attending: General Surgery | Admitting: General Surgery

## 2020-03-24 DIAGNOSIS — Z01812 Encounter for preprocedural laboratory examination: Secondary | ICD-10-CM | POA: Insufficient documentation

## 2020-03-24 DIAGNOSIS — Z20822 Contact with and (suspected) exposure to covid-19: Secondary | ICD-10-CM | POA: Diagnosis not present

## 2020-03-24 LAB — SARS CORONAVIRUS 2 (TAT 6-24 HRS): SARS Coronavirus 2: NEGATIVE

## 2020-03-27 ENCOUNTER — Other Ambulatory Visit: Payer: Medicare PPO

## 2020-03-29 ENCOUNTER — Ambulatory Visit: Payer: Medicare PPO | Admitting: Anesthesiology

## 2020-03-29 ENCOUNTER — Encounter: Payer: Self-pay | Admitting: General Surgery

## 2020-03-29 ENCOUNTER — Encounter
Admission: RE | Disposition: A | Discharge status: Home / Self Care | Payer: Self-pay | Source: Home / Self Care | Attending: General Surgery

## 2020-03-29 ENCOUNTER — Ambulatory Visit
Admission: RE | Admit: 2020-03-29 | Discharge: 2020-03-29 | Disposition: A | Discharge status: Home / Self Care | Payer: Medicare PPO | Attending: General Surgery | Admitting: General Surgery

## 2020-03-29 ENCOUNTER — Other Ambulatory Visit: Payer: Self-pay

## 2020-03-29 DIAGNOSIS — Z7902 Long term (current) use of antithrombotics/antiplatelets: Secondary | ICD-10-CM | POA: Insufficient documentation

## 2020-03-29 DIAGNOSIS — E781 Pure hyperglyceridemia: Secondary | ICD-10-CM | POA: Diagnosis not present

## 2020-03-29 DIAGNOSIS — I10 Essential (primary) hypertension: Secondary | ICD-10-CM | POA: Diagnosis not present

## 2020-03-29 DIAGNOSIS — Z86010 Personal history of colonic polyps: Secondary | ICD-10-CM | POA: Diagnosis not present

## 2020-03-29 DIAGNOSIS — Z955 Presence of coronary angioplasty implant and graft: Secondary | ICD-10-CM | POA: Diagnosis not present

## 2020-03-29 DIAGNOSIS — Z7951 Long term (current) use of inhaled steroids: Secondary | ICD-10-CM | POA: Insufficient documentation

## 2020-03-29 DIAGNOSIS — K449 Diaphragmatic hernia without obstruction or gangrene: Secondary | ICD-10-CM | POA: Diagnosis not present

## 2020-03-29 DIAGNOSIS — F329 Major depressive disorder, single episode, unspecified: Secondary | ICD-10-CM | POA: Insufficient documentation

## 2020-03-29 DIAGNOSIS — R109 Unspecified abdominal pain: Secondary | ICD-10-CM | POA: Diagnosis not present

## 2020-03-29 DIAGNOSIS — R1310 Dysphagia, unspecified: Secondary | ICD-10-CM | POA: Diagnosis not present

## 2020-03-29 DIAGNOSIS — E119 Type 2 diabetes mellitus without complications: Secondary | ICD-10-CM | POA: Diagnosis not present

## 2020-03-29 DIAGNOSIS — F17210 Nicotine dependence, cigarettes, uncomplicated: Secondary | ICD-10-CM | POA: Insufficient documentation

## 2020-03-29 DIAGNOSIS — K228 Other specified diseases of esophagus: Secondary | ICD-10-CM | POA: Insufficient documentation

## 2020-03-29 DIAGNOSIS — K2940 Chronic atrophic gastritis without bleeding: Secondary | ICD-10-CM | POA: Insufficient documentation

## 2020-03-29 DIAGNOSIS — K219 Gastro-esophageal reflux disease without esophagitis: Secondary | ICD-10-CM | POA: Insufficient documentation

## 2020-03-29 DIAGNOSIS — Z7982 Long term (current) use of aspirin: Secondary | ICD-10-CM | POA: Insufficient documentation

## 2020-03-29 DIAGNOSIS — Z7984 Long term (current) use of oral hypoglycemic drugs: Secondary | ICD-10-CM | POA: Diagnosis not present

## 2020-03-29 DIAGNOSIS — R197 Diarrhea, unspecified: Secondary | ICD-10-CM | POA: Diagnosis not present

## 2020-03-29 DIAGNOSIS — K2970 Gastritis, unspecified, without bleeding: Secondary | ICD-10-CM | POA: Diagnosis not present

## 2020-03-29 DIAGNOSIS — I2510 Atherosclerotic heart disease of native coronary artery without angina pectoris: Secondary | ICD-10-CM | POA: Insufficient documentation

## 2020-03-29 DIAGNOSIS — Z79899 Other long term (current) drug therapy: Secondary | ICD-10-CM | POA: Insufficient documentation

## 2020-03-29 DIAGNOSIS — K319 Disease of stomach and duodenum, unspecified: Secondary | ICD-10-CM | POA: Diagnosis not present

## 2020-03-29 DIAGNOSIS — Z96653 Presence of artificial knee joint, bilateral: Secondary | ICD-10-CM | POA: Insufficient documentation

## 2020-03-29 DIAGNOSIS — K529 Noninfective gastroenteritis and colitis, unspecified: Secondary | ICD-10-CM | POA: Diagnosis not present

## 2020-03-29 DIAGNOSIS — R195 Other fecal abnormalities: Secondary | ICD-10-CM | POA: Diagnosis not present

## 2020-03-29 DIAGNOSIS — D509 Iron deficiency anemia, unspecified: Secondary | ICD-10-CM | POA: Insufficient documentation

## 2020-03-29 DIAGNOSIS — J449 Chronic obstructive pulmonary disease, unspecified: Secondary | ICD-10-CM | POA: Diagnosis not present

## 2020-03-29 DIAGNOSIS — G4730 Sleep apnea, unspecified: Secondary | ICD-10-CM | POA: Insufficient documentation

## 2020-03-29 DIAGNOSIS — K2100 Gastro-esophageal reflux disease with esophagitis, without bleeding: Secondary | ICD-10-CM | POA: Diagnosis not present

## 2020-03-29 HISTORY — PX: COLONOSCOPY WITH PROPOFOL: SHX5780

## 2020-03-29 HISTORY — PX: ESOPHAGOGASTRODUODENOSCOPY (EGD) WITH PROPOFOL: SHX5813

## 2020-03-29 SURGERY — ESOPHAGOGASTRODUODENOSCOPY (EGD) WITH PROPOFOL
Anesthesia: General

## 2020-03-29 MED ORDER — SODIUM CHLORIDE 0.9 % IV SOLN
INTRAVENOUS | Status: DC | PRN
Start: 2020-03-29 — End: 2020-03-29

## 2020-03-29 MED ORDER — PROPOFOL 10 MG/ML IV BOLUS
INTRAVENOUS | Status: DC | PRN
  Administered 2020-03-29: 100 mg via INTRAVENOUS

## 2020-03-29 MED ORDER — PROPOFOL 10 MG/ML IV BOLUS
INTRAVENOUS | Status: AC
  Filled 2020-03-29: qty 20

## 2020-03-29 MED ORDER — PHENYLEPHRINE HCL (PRESSORS) 10 MG/ML IV SOLN
INTRAVENOUS | Status: DC | PRN
  Administered 2020-03-29 (×5): 200 ug via INTRAVENOUS

## 2020-03-29 MED ORDER — PROPOFOL 500 MG/50ML IV EMUL
INTRAVENOUS | Status: AC
  Filled 2020-03-29: qty 50

## 2020-03-29 MED ORDER — GLYCOPYRROLATE PF 0.2 MG/ML IJ SOSY
PREFILLED_SYRINGE | INTRAMUSCULAR | Status: DC | PRN
  Administered 2020-03-29: .2 mg via INTRAVENOUS

## 2020-03-29 MED ORDER — LIDOCAINE HCL (PF) 2 % IJ SOLN
INTRAMUSCULAR | Status: AC
  Filled 2020-03-29: qty 5

## 2020-03-29 MED ORDER — SODIUM CHLORIDE 0.9 % IV SOLN: INTRAVENOUS | Status: DC

## 2020-03-29 MED ORDER — LIDOCAINE HCL (CARDIAC) PF 100 MG/5ML IV SOSY
PREFILLED_SYRINGE | INTRAVENOUS | Status: DC | PRN
  Administered 2020-03-29: 100 mg via INTRAVENOUS

## 2020-03-29 MED ORDER — PROPOFOL 500 MG/50ML IV EMUL
INTRAVENOUS | Status: DC | PRN
  Administered 2020-03-29: 160 ug/kg/min via INTRAVENOUS

## 2020-03-29 MED ORDER — GLYCOPYRROLATE 0.2 MG/ML IJ SOLN
INTRAMUSCULAR | Status: AC
  Filled 2020-03-29: qty 1

## 2020-03-29 NOTE — Op Note (Addendum)
[facility] Gastroenterology Patient Name: Kathryn Friedman Procedure Date: 03/29/2020 9:15 AM MRN: [id number] Account #: [id number] Date of Birth: [birth year] Admit Type: Outpatient Age: 64 Room: [facility] ENDO ROOM 1 Gender: Female Note Status: Supervisor Override Procedure:             Colonoscopy Indications:           High risk colon cancer surveillance: Personal history                         of colonic polyps, Chronic diarrhea, Heme positive                         stool, Iron deficiency anemia Providers:             Robert Bellow, MD Medicines:             Monitored Anesthesia Care Complications:         No immediate complications. Procedure:             Pre-Anesthesia Assessment:                        - Prior to the procedure, a History and Physical was                         performed, and patient medications, allergies and                         sensitivities were reviewed. The patient's tolerance                         of previous anesthesia was reviewed.                        - Prior to the procedure, a History and Physical was                         performed, and patient medications, allergies and                         sensitivities were reviewed. The patient's tolerance                         of previous anesthesia was reviewed.                        - The risks and benefits of the procedure and the                         sedation options and risks were discussed with the                         patient. All questions were answered and informed                         consent was obtained.                        After obtaining informed consent, the colonoscope was  passed under direct vision. Throughout the procedure,                         the patient's blood pressure, pulse, and oxygen                         saturations were monitored continuously. The                         Colonoscope was introduced  through the anus and                         advanced to the the cecum, identified by appendiceal                         orifice and ileocecal valve. The colonoscopy was                         performed without difficulty. The patient tolerated                         the procedure well. The quality of the bowel                         preparation was good. Findings:      The entire examined colon appeared normal on direct and retroflexion       views. Impression:            - The entire examined colon is normal on direct and                         retroflexion views.                        - No specimens collected. Recommendation:        - Telephone endoscopist for pathology results in 1                         week. Procedure Code(s):     --- Professional ---                        [phone], Colonoscopy, flexible; diagnostic, including                         collection of specimen(s) by brushing or washing, when                         performed (separate procedure) Diagnosis Code(s):     --- Professional ---                        Z86.010, Personal history of colonic polyps CPT copyright 2019 American Medical Association. All rights reserved. The codes documented in this report are preliminary and upon coder review may  be revised to meet current compliance requirements. Robert Bellow, MD 03/29/2020 10:10:29 AM This report has been signed electronically. Number of Addenda: 0 Note Initiated On: 03/29/2020 9:15 AM Scope Withdrawal Time: 0 hours 15 minutes 9 seconds  Total Procedure Duration: 0 hours 24 minutes 30 seconds  Estimated Blood Loss:  Estimated blood loss: none.      [facility]

## 2020-03-29 NOTE — Op Note (Addendum)
[facility] Gastroenterology Patient Name: Kathryn Friedman Procedure Date: 03/29/2020 9:15 AM MRN: [id number] Account #: [id number] Date of Birth: 12-01-1955 Admit Type: Outpatient Age: 64 Room: [facility] ENDO ROOM 1 Gender: Female Note Status: Supervisor Override Procedure:             Upper GI endoscopy Indications:           Iron deficiency anemia, Dysphagia, Heme positive stool Providers:             Robert Bellow, MD Medicines:             Monitored Anesthesia Care Complications:         No immediate complications. Procedure:             Pre-Anesthesia Assessment:                        - Prior to the procedure, a History and Physical was                         performed, and patient medications, allergies and                         sensitivities were reviewed. The patient's tolerance                         of previous anesthesia was reviewed.                        - The risks and benefits of the procedure and the                         sedation options and risks were discussed with the                         patient. All questions were answered and informed                         consent was obtained.                        After obtaining informed consent, the endoscope was                         passed under direct vision. Throughout the procedure,                         the patient's blood pressure, pulse, and oxygen                         saturations were monitored continuously. The Endoscope                         was introduced through the mouth, and advanced to the                         third part of duodenum. The upper GI endoscopy was                         accomplished without difficulty. The patient tolerated  the procedure well. Findings:      The esophagus was normal.      A small hiatal hernia was present.      Diffuse atrophic mucosa was found in the gastric body and in the gastric       antrum.    The examined duodenum was normal. Impression:            - Normal esophagus.                        - Small hiatal hernia.                        - Gastric mucosal atrophy.                        - Normal examined duodenum.                        - No specimens collected. Recommendation:        - Telephone endoscopist for pathology results in 1                         week. Procedure Code(s):     --- Professional ---                        [phone], Esophagogastroduodenoscopy, flexible,                         transoral; diagnostic, including collection of                         specimen(s) by brushing or washing, when performed                         (separate procedure) Diagnosis Code(s):     --- Professional ---                        K44.9, Diaphragmatic hernia without obstruction or                         gangrene                        K31.89, Other diseases of stomach and duodenum                        R13.10, Dysphagia, unspecified                        R19.5, Other fecal abnormalities CPT copyright 2019 American Medical Association. All rights reserved. The codes documented in this report are preliminary and upon coder review may  be revised to meet current compliance requirements. Robert Bellow, MD 03/29/2020 9:41:41 AM This report has been signed electronically. Number of Addenda: 0 Note Initiated On: 03/29/2020 9:15 AM Estimated Blood Loss:  Estimated blood loss: none.      [facility]

## 2020-03-29 NOTE — H&P (Signed)
Kathryn Friedman [id number] Nov 30, 1955     HPI:  64 year old woman with long history of multi-factorial diarrhea. Recent onset dysphagia, especially for hard boiled eggs.  Heme positive stools. For upper and lower endoscopy.   Medications Prior to Admission  Medication Sig Dispense Refill Last Dose  . albuterol (VENTOLIN HFA) 108 (90 Base) MCG/ACT inhaler INHALE 2 PUFFS FOUR TIMES A DAY 54 g 0 03/29/2020 at 0530  . amLODipine (NORVASC) 10 MG tablet Take 1 tablet (10 mg total) by mouth daily. 90 tablet 0 03/29/2020 at 0530  . aspirin 81 MG tablet Take 81 mg by mouth daily.    Past Week at Unknown time  . atorvastatin (LIPITOR) 20 MG tablet TAKE ONE TABLET BY MOUTH EVERY EVENING 90 tablet 0 03/28/2020 at 1700  . Brexpiprazole (REXULTI) 2 MG TABS Take 2 mg by mouth daily.   03/28/2020 at 2000  . CALCIUM PO Take 1 tablet by mouth daily.   Past Week at Unknown time  . clopidogrel (PLAVIX) 75 MG tablet Take 1 tablet (75 mg total) by mouth daily. 90 tablet 0 03/23/2020 at Unknown time  . colestipol (COLESTID) 1 g tablet Take 3 g by mouth daily.    Past Week at Unknown time  . cyclobenzaprine (FLEXERIL) 10 MG tablet TAKE ONE TABLET BY MOUTH 3 TIMES A DAY AS NEEDED FOR MUSCLE SPASMS 90 tablet 0 03/28/2020 at 2000  . DULoxetine (CYMBALTA) 60 MG capsule Take 60 mg by mouth daily.   03/28/2020 at 0500  . escitalopram (LEXAPRO) 10 MG tablet Take 15 mg by mouth daily.   03/28/2020 at 0500  . Fluticasone-Salmeterol (ADVAIR DISKUS) 250-50 MCG/DOSE AEPB INHALE 1 PUFF 2 TIMES A DAY. RINSE MOUTH AND SPIT AFTER EACH USE. 180 each 1 03/29/2020 at 0530  . liraglutide (VICTOZA) 18 MG/3ML SOPN Inject 0.6 mg subcutaneously daily for 1 week, then increase to 1.2 mg daily for 1 week, then increase to 1.8 mg daily thereafter. 1 pen 0 Past Week at Unknown time  . Loperamide HCl (IMODIUM A-D PO) Take 2 tablets by mouth daily.   Past Week at Unknown time  . metFORMIN (GLUCOPHAGE-XR) 500 MG 24 hr tablet Take 2 tablets (1,000 mg total)  by mouth in the morning and at bedtime. 360 tablet 0 03/28/2020 at 1700  . Multiple Vitamin (MULTIVITAMIN PO) Take 1 tablet by mouth daily.   Past Week at Unknown time  . mupirocin ointment (BACTROBAN) 2 % Apply to affected area bid 22 g 0 Past Week at Unknown time  . nystatin (NYSTATIN) powder APPLY TOPICALLY 2 TIMES A DAY (Patient taking differently: Apply topically 2 (two) times daily as needed (yeast infection). ) 15 g 0 Past Month at Unknown time  . nystatin cream (MYCOSTATIN) APPLY ONE APPLICATION TOPICALLY 2 TIMES A DAY (Patient taking differently: Apply 1 application topically 2 (two) times daily as needed (yeast infection). ) 30 g 0 Past Month at Unknown time  . pantoprazole (PROTONIX) 40 MG tablet Take 1 tablet (40 mg total) by mouth 2 (two) times daily before a meal. 180 tablet 0 03/29/2020 at 0530  . propranolol (INDERAL) 40 MG tablet TAKE ONE TABLET BY MOUTH 2 TIMES A DAY 180 tablet 0 03/28/2020 at 0500  . sucralfate (CARAFATE) 1 g tablet Take 1 g by mouth 2 (two) times daily before a meal.    Past Month at Unknown time  . telmisartan (MICARDIS) 40 MG tablet Take 1 tablet (40 mg total) by mouth daily. 90 tablet 0  03/29/2020 at 0530  . tolterodine (DETROL LA) 4 MG 24 hr capsule Take 1 capsule (4 mg total) by mouth daily. 30 capsule 1 03/29/2020 at 0530  . traZODone (DESYREL) 100 MG tablet Take 200 mg by mouth at bedtime.    03/28/2020 at 2000  . folic acid (FOLVITE) 1 MG tablet Take 1 mg by mouth daily.   Not Taking at Unknown time  . hyoscyamine (LEVSIN SL) 0.125 MG SL tablet Place 0.125 mg under the tongue every 4 (four) hours as needed for cramping (diarrhea).      . isosorbide mononitrate (IMDUR) 30 MG 24 hr tablet Take 30 mg by mouth daily.      . Probiotic Product (PROBIOTIC DAILY PO) Take 1 tablet by mouth daily.   Not Taking at Unknown time   Allergies  Allergen Reactions  . Chantix [Varenicline] Other (See Comments)    "I got really depressed"  . Jardiance [Empagliflozin] Other (See  Comments)    Yeast infection   Past Medical History:  Diagnosis Date  . Anemia   . Arthritis    back and knees  . Asthma   . Blood in stool   . Chronic diarrhea   . COPD (chronic obstructive pulmonary disease) ([place])   . Coronary artery disease    s/p stent placement 06/25/06  . Depression    secondary to the death of her husband (died 46)  . Diabetes mellitus without complication ([place]) Diagnosed 05/2008  . Diverticulitis   . Diverticulosis   . Dizzinesses   . Dysphagia   . Fatty infiltration of liver   . GERD (gastroesophageal reflux disease)   . Headache   . Hypertension   . Hypertriglyceridemia   . ILD (interstitial lung disease) ([place])   . Lower abdominal pain   . Lump in the abdomen   . PSVT (paroxysmal supraventricular tachycardia) ([place])   . Sleep apnea    on CPAP  . Spastic colon   . Tobacco abuse   . Venous insufficiency of both lower extremities    Past Surgical History:  Procedure Laterality Date  . ABDOMINAL HYSTERECTOMY  with left ovary in place 1996  . APPENDECTOMY  1985  . BREAST BIOPSY Left 10/14/2017   calcs bx, fibrosis giant cell reaction and chronic inflammation, negative for malignancy.   Marland Kitchen CARDIAC CATHETERIZATION  2007   stents  . CESAREAN SECTION  1984  . CHOLECYSTECTOMY  1985  . COLONOSCOPY WITH PROPOFOL N/A 09/13/2016   Procedure: COLONOSCOPY WITH PROPOFOL;  Surgeon: Manya Silvas, MD;  Location[facility] ENDOSCOPY;  Service: Endoscopy;  Laterality: N/A;  . COLONOSCOPY WITH PROPOFOL N/A 11/09/2018   Procedure: COLONOSCOPY WITH PROPOFOL;  Surgeon: Manya Silvas, MD;  [facility] ENDOSCOPY;  Service: Endoscopy;  Laterality: N/A;  . ESOPHAGOGASTRODUODENOSCOPY (EGD) WITH PROPOFOL N/A 02/02/2018   Procedure: ESOPHAGOGASTRODUODENOSCOPY (EGD) WITH PROPOFOL;  Surgeon: Manya Silvas, MD;  Location: [facility] ENDOSCOPY;  Service: Endoscopy;  Laterality: N/A;  . JOINT REPLACEMENT     bilateral knee replacements  . KNEE ARTHROSCOPY  Arthroscopic  left knee surgery   . KNEE SURGERY  status post knee surgey   . LEFT HEART CATH AND CORONARY ANGIOGRAPHY Left 05/14/2018   Procedure: LEFT HEART CATH AND CORONARY ANGIOGRAPHY;  Surgeon: Corey Skains, MD;  Location: [place] CV LAB;  Service: Cardiovascular;  Laterality: Left;  . REPLACEMENT TOTAL KNEE  ([facility])  . SHOULDER SURGERY  shoulder operation secondary to a torn tendon   Social History   Socioeconomic  History  . Marital status: Married    Spouse name: Not on file  . Number of children: 1  . Years of education: Not on file  . Highest education level: Not on file  Occupational History    Employer: [employer]  Tobacco Use  . Smoking status: Current Every Day Smoker    Packs/day: 1.00    Years: 45.00    Pack years: 45.00    Types: Cigarettes  . Smokeless tobacco: Never Used  Substance and Sexual Activity  . Alcohol use: No    Alcohol/week: 0.0 standard drinks  . Drug use: No  . Sexual activity: Not on file  Other Topics Concern  . Not on file  Social History Narrative  . Not on file   Social Determinants of Health   Financial Resource Strain:   . Difficulty of Paying Living Expenses:   Food Insecurity:   . Worried About Charity fundraiser in the Last Year:   . Arboriculturist in the Last Year:   Transportation Needs:   . Film/video editor (Medical):   Marland Kitchen Lack of Transportation (Non-Medical):   Physical Activity: Inactive  . Days of Exercise per Week: 0 days  . Minutes of Exercise per Session: 0 min  Stress:   . Feeling of Stress :   Social Connections:   . Frequency of Communication with Friends and Family:   . Frequency of Social Gatherings with Friends and Family:   . Attends Religious Services:   . Active Member of Clubs or Organizations:   . Attends Archivist Meetings:   Marland Kitchen Marital Status:   Intimate Partner Violence:   . Fear of Current or Ex-Partner:   . Emotionally Abused:   Marland Kitchen Physically Abused:   . Sexually Abused:    Social  History   Social History Narrative  . Not on file     ROS: Negative.     PE: HEENT: Negative. Lungs: Clear. Cardio: RR.  Assessment/Plan:  Proceed with planned endoscopy.    Forest Gleason Greysen Swanton 03/29/2020   Assessment/Plan:  Proceed with planned endoscopy.

## 2020-03-29 NOTE — Anesthesia Postprocedure Evaluation (Signed)
Anesthesia Post Note  Patient: MAKYA GRILLIOT  Procedure(s) Performed: ESOPHAGOGASTRODUODENOSCOPY (EGD) WITH PROPOFOL (N/A ) COLONOSCOPY WITH PROPOFOL (N/A )  Patient location during evaluation: Endoscopy Anesthesia Type: General Level of consciousness: awake and alert Pain management: pain level controlled Vital Signs Assessment: post-procedure vital signs reviewed and stable Respiratory status: spontaneous breathing, nonlabored ventilation, respiratory function stable and patient connected to nasal cannula oxygen Cardiovascular status: blood pressure returned to baseline and stable Postop Assessment: no apparent nausea or vomiting Anesthetic complications: no     Last Vitals:  Vitals:   03/29/20 1013 03/29/20 1041  BP: 101/73 117/65  Pulse: 73   Resp:    Temp:    SpO2: 94%     Last Pain:  Vitals:   03/29/20 1041  TempSrc:   PainSc: 0-No pain                 Precious Haws Haden Suder

## 2020-03-29 NOTE — Anesthesia Preprocedure Evaluation (Signed)
Anesthesia Evaluation  Patient identified by MRN, date of birth, ID band Patient awake    Reviewed: Allergy & Precautions, H&P , NPO status , Patient's Chart, lab work & pertinent test results  History of Anesthesia Complications Negative for: history of anesthetic complications  Airway Mallampati: III  TM Distance: <3 FB Neck ROM: limited    Dental  (+) Chipped, Poor Dentition, Missing   Pulmonary asthma , sleep apnea and Continuous Positive Airway Pressure Ventilation , COPD, Current Smoker,    Pulmonary exam normal        Cardiovascular Exercise Tolerance: Good hypertension, (-) angina+ CAD and + Cardiac Stents  (-) DOE Normal cardiovascular exam     Neuro/Psych  Headaches, PSYCHIATRIC DISORDERS    GI/Hepatic negative GI ROS, Neg liver ROS, GERD  Medicated and Controlled,  Endo/Other  diabetes, Type 2  Renal/GU negative Renal ROS  negative genitourinary   Musculoskeletal  (+) Arthritis ,   Abdominal   Peds  Hematology negative hematology ROS (+)   Anesthesia Other Findings Past Medical History: No date: Anemia No date: Arthritis     Comment:  back and knees No date: Asthma No date: Blood in stool No date: Chronic diarrhea No date: COPD (chronic obstructive pulmonary disease) ([facility]) No date: Coronary artery disease     Comment:  s/p stent placement 06/25/06 No date: Depression     Comment:  secondary to the death of her husband (died 59) Diagnosed 07/12/2008: Diabetes mellitus without complication ([place]) No date: Diverticulitis No date: Diverticulosis No date: Dizzinesses No date: Dysphagia No date: Fatty infiltration of liver No date: GERD (gastroesophageal reflux disease) No date: Headache No date: Hypertension No date: Hypertriglyceridemia No date: ILD (interstitial lung disease) (H[facility]) No date: Lower abdominal pain No date: Lump in the abdomen No date: PSVT (paroxysmal supraventricular tachycardia)  ([facility]) No date: Sleep apnea     Comment:  on CPAP No date: Spastic colon No date: Tobacco abuse No date: Venous insufficiency of both lower extremities  Past Surgical History: with left ovary in place 1996: [place]: APPENDECTOMY 10/14/2017: BREAST BIOPSY; Left     Comment:  calcs bx, fibrosis giant cell reaction and chronic               inflammation, negative for malignancy.  2007: CARDIAC CATHETERIZATION     Comment:  stents 1984: [place]: CHOLECYSTECTOMY 09/13/2016: COLONOSCOPY WITH PROPOFOL; N/A     Comment:  Procedure: COLONOSCOPY WITH PROPOFOL;  Surgeon: Manya Silvas, MD;  Location: [facility] ENDOSCOPY;  Service:               Endoscopy;  Laterality: N/A; 11/09/2018: COLONOSCOPY WITH PROPOFOL; N/A     Comment:  Procedure: COLONOSCOPY WITH PROPOFOL;  Surgeon: Manya Silvas, MD;  Location: [facility] ENDOSCOPY;  Service:               Endoscopy;  Laterality: N/A; 02/02/2018: ESOPHAGOGASTRODUODENOSCOPY (EGD) WITH PROPOFOL; N/A     Comment:  Procedure: ESOPHAGOGASTRODUODENOSCOPY (EGD) WITH               PROPOFOL;  Surgeon: Manya Silvas, MD;  Location:               [facility] ENDOSCOPY;  Service: Endoscopy;  Laterality: N/A; No date: JOINT REPLACEMENT     Comment:  bilateral knee replacements Arthroscopic left  knee surgery : KNEE ARTHROSCOPY status post knee surgey : KNEE SURGERY 05/14/2018: LEFT HEART CATH AND CORONARY ANGIOGRAPHY; Left     Comment:  Procedure: LEFT HEART CATH AND CORONARY ANGIOGRAPHY;                Surgeon: Corey Skains, MD;  Location: [place]               CV LAB;  Service: Cardiovascular;  Laterality: Left; ([facility]): REPLACEMENT TOTAL KNEE shoulder operation secondary to a torn tendon: SHOULDER SURGERY  BMI    Body Mass Index: 35.19 kg/m      Reproductive/Obstetrics negative OB ROS                             Anesthesia Physical Anesthesia Plan  ASA:  III  Anesthesia Plan: General   Post-op Pain Management:    Induction: Intravenous  PONV Risk Score and Plan: Propofol infusion and TIVA  Airway Management Planned: Natural Airway and Nasal Cannula  Additional Equipment:   Intra-op Plan:   Post-operative Plan:   Informed Consent: I have reviewed the patients History and Physical, chart, labs and discussed the procedure including the risks, benefits and alternatives for the proposed anesthesia with the patient or authorized representative who has indicated his/her understanding and acceptance.     Dental Advisory Given  Plan Discussed with: Anesthesiologist, CRNA and Surgeon  Anesthesia Plan Comments: (Patient consented for risks of anesthesia including but not limited to:  - adverse reactions to medications - risk of intubation if required - damage to eyes, teeth, lips or other oral mucosa - nerve damage due to positioning  - sore throat or hoarseness - Damage to heart, brain, nerves, lungs or loss of life  Patient voiced understanding.)        Anesthesia Quick Evaluation

## 2020-03-29 NOTE — Transfer of Care (Signed)
Immediate Anesthesia Transfer of Care Note  Patient: Kathryn Friedman  Procedure(s) Performed: ESOPHAGOGASTRODUODENOSCOPY (EGD) WITH PROPOFOL (N/A ) COLONOSCOPY WITH PROPOFOL (N/A )  Patient Location: Endoscopy Unit  Anesthesia Type:General  Level of Consciousness: awake  Airway & Oxygen Therapy: Patient Spontanous Breathing and Patient connected to nasal cannula oxygen  Post-op Assessment: Report given to RN and Post -op Vital signs reviewed and stable  Post vital signs: Reviewed and stable  Last Vitals:  Vitals Value Taken Time  BP 101/73 03/29/20 1013  Temp 36.7 C 03/29/20 1011  Pulse 73 03/29/20 1013  Resp 16 03/29/20 1012  SpO2 94 % 03/29/20 1013  Vitals shown include unvalidated device data.  Last Pain:  Vitals:   03/29/20 1011  TempSrc: Temporal  PainSc: 0-No pain         Complications: No apparent anesthesia complications

## 2020-03-30 ENCOUNTER — Encounter: Payer: Self-pay | Admitting: *Deleted

## 2020-03-31 LAB — SURGICAL PATHOLOGY

## 2020-04-03 ENCOUNTER — Encounter: Payer: Self-pay | Admitting: Internal Medicine

## 2020-04-03 ENCOUNTER — Ambulatory Visit (INDEPENDENT_AMBULATORY_CARE_PROVIDER_SITE_OTHER): Payer: Medicare PPO

## 2020-04-03 ENCOUNTER — Ambulatory Visit (INDEPENDENT_AMBULATORY_CARE_PROVIDER_SITE_OTHER): Payer: Medicare PPO | Admitting: Internal Medicine

## 2020-04-03 ENCOUNTER — Other Ambulatory Visit: Payer: Self-pay

## 2020-04-03 VITALS — BP 128/70 | HR 95 | Temp 97.8°F | Resp 16 | Ht 64.0 in | Wt 213.2 lb

## 2020-04-03 DIAGNOSIS — J849 Interstitial pulmonary disease, unspecified: Secondary | ICD-10-CM

## 2020-04-03 DIAGNOSIS — I10 Essential (primary) hypertension: Secondary | ICD-10-CM

## 2020-04-03 DIAGNOSIS — F320 Major depressive disorder, single episode, mild: Secondary | ICD-10-CM | POA: Diagnosis not present

## 2020-04-03 DIAGNOSIS — E7800 Pure hypercholesterolemia, unspecified: Secondary | ICD-10-CM

## 2020-04-03 DIAGNOSIS — Z0000 Encounter for general adult medical examination without abnormal findings: Secondary | ICD-10-CM

## 2020-04-03 DIAGNOSIS — I2510 Atherosclerotic heart disease of native coronary artery without angina pectoris: Secondary | ICD-10-CM

## 2020-04-03 DIAGNOSIS — R059 Cough, unspecified: Secondary | ICD-10-CM

## 2020-04-03 DIAGNOSIS — Z87891 Personal history of nicotine dependence: Secondary | ICD-10-CM

## 2020-04-03 DIAGNOSIS — R05 Cough: Secondary | ICD-10-CM

## 2020-04-03 DIAGNOSIS — F32A Depression, unspecified: Secondary | ICD-10-CM

## 2020-04-03 DIAGNOSIS — D509 Iron deficiency anemia, unspecified: Secondary | ICD-10-CM

## 2020-04-03 DIAGNOSIS — K219 Gastro-esophageal reflux disease without esophagitis: Secondary | ICD-10-CM | POA: Diagnosis not present

## 2020-04-03 DIAGNOSIS — E1159 Type 2 diabetes mellitus with other circulatory complications: Secondary | ICD-10-CM

## 2020-04-03 DIAGNOSIS — D649 Anemia, unspecified: Secondary | ICD-10-CM

## 2020-04-03 DIAGNOSIS — G4733 Obstructive sleep apnea (adult) (pediatric): Secondary | ICD-10-CM

## 2020-04-03 DIAGNOSIS — J449 Chronic obstructive pulmonary disease, unspecified: Secondary | ICD-10-CM

## 2020-04-03 MED ORDER — CEFDINIR 300 MG PO CAPS: 300.0000 mg | ORAL_CAPSULE | Freq: Two times a day (BID) | ORAL | 0 refills | Status: DC

## 2020-04-03 MED ORDER — BENZONATATE 100 MG PO CAPS
100.0000 mg | ORAL_CAPSULE | Freq: Three times a day (TID) | ORAL | 0 refills | Status: DC | PRN
Start: 2020-04-03 — End: 2020-06-21
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
## 2020-04-03 NOTE — Assessment & Plan Note (Signed)
Physical today 04/03/20.  Mammogram 12/29/19 - Birads II.  Colonoscopy 03/29/20.

## 2020-04-04 ENCOUNTER — Other Ambulatory Visit: Payer: Self-pay | Admitting: Internal Medicine

## 2020-04-04 DIAGNOSIS — R9389 Abnormal findings on diagnostic imaging of other specified body structures: Secondary | ICD-10-CM

## 2020-04-04 DIAGNOSIS — R05 Cough: Secondary | ICD-10-CM

## 2020-04-04 DIAGNOSIS — R059 Cough, unspecified: Secondary | ICD-10-CM

## 2020-04-04 NOTE — Progress Notes (Signed)
Order placed for pulmonary referral.  

## 2020-04-07 ENCOUNTER — Telehealth (INDEPENDENT_AMBULATORY_CARE_PROVIDER_SITE_OTHER): Payer: Medicare PPO | Admitting: Urology

## 2020-04-07 ENCOUNTER — Other Ambulatory Visit: Payer: Self-pay

## 2020-04-07 ENCOUNTER — Encounter: Payer: Self-pay | Admitting: Urology

## 2020-04-07 DIAGNOSIS — G4730 Sleep apnea, unspecified: Secondary | ICD-10-CM

## 2020-04-07 DIAGNOSIS — Z9989 Dependence on other enabling machines and devices: Secondary | ICD-10-CM

## 2020-04-07 DIAGNOSIS — R351 Nocturia: Secondary | ICD-10-CM

## 2020-04-07 DIAGNOSIS — N3941 Urge incontinence: Secondary | ICD-10-CM

## 2020-04-07 DIAGNOSIS — N3281 Overactive bladder: Secondary | ICD-10-CM | POA: Diagnosis not present

## 2020-04-07 MED ORDER — SOLIFENACIN SUCCINATE 10 MG PO TABS: 10.0000 mg | ORAL_TABLET | Freq: Every day | ORAL | 0 refills | Status: DC

## 2020-04-07 NOTE — Progress Notes (Signed)
Virtual Visit via Telephone Note  I connected with Kathryn Friedman on 04/07/20 at 11:40AM by telephone and verified that I am speaking with the correct person using two identifiers.  Location: Patient: Home Provider: [facility]   I discussed the limitations, risks, security and privacy concerns of performing an evaluation and management service by telephone and the availability of in person appointments. I also discussed with the patient that there may be a patient responsible charge related to this service. The patient expressed understanding and agreed to proceed.   History of Present Illness: 64 y.o. female initially seen 03/02/2020 for overactive bladder with urge incontinence. She elected medical management and was started on tolterodine. She has noted marked improvement in her daytime symptoms on this medication however complains of nocturia x3-4. She feels her nighttime voided volumes are greater than her daytime voided volumes. She does have sleep apnea on CPAP. Denies dysuria or gross hematuria.   Observations/Objective: N/A  Assessment and Plan:  -Overactive bladder with urge incontinence Marked improvement in daytime symptoms on tolterodine. Nocturia has persisted and is most likely secondary to nocturnal polyuria. She would be unable to afford Nocdurna. Unlikely a different anticholinergic would benefit her nocturia however she would like to try. Rx solifenacin 10 mg was sent to pharmacy   Follow Up Instructions: Call back 1 month regarding medication efficacy   I discussed the assessment and treatment plan with the patient. The patient was provided an opportunity to ask questions and all were answered. The patient agreed with the plan and demonstrated an understanding of the instructions.   The patient was advised to call back or seek an in-person evaluation if the symptoms worsen or if the condition fails to improve as anticipated.  I provided 15 minutes  of non-face-to-face time during this encounter.   Abbie Sons, MD

## 2020-04-09 ENCOUNTER — Encounter: Payer: Self-pay | Admitting: Internal Medicine

## 2020-04-09 NOTE — Assessment & Plan Note (Signed)
Discussed the importance of quitting smoking.  She declines to stop.  Follow.

## 2020-04-09 NOTE — Assessment & Plan Note (Signed)
Follow cbc.  

## 2020-04-09 NOTE — Assessment & Plan Note (Signed)
Low carb diet and exercise.  Follow met b and a1c.

## 2020-04-09 NOTE — Assessment & Plan Note (Signed)
Continue inhalers.  Follow. Treat current infection.

## 2020-04-09 NOTE — Assessment & Plan Note (Signed)
Follow cbc and iron studies.  

## 2020-04-09 NOTE — Assessment & Plan Note (Signed)
CPAP.  

## 2020-04-09 NOTE — Assessment & Plan Note (Signed)
Upper symptoms controlled on protonix.  

## 2020-04-09 NOTE — Assessment & Plan Note (Signed)
With increased cough and congestion as outlined.  Treat infection. F/u with pulmonary.  Continue inhalers.

## 2020-04-09 NOTE — Assessment & Plan Note (Addendum)
Increased cough and congestion as outlined.  Continue advair bid and albuterol prn.  Check cxr.  Treat for URI - omnicef.   With current symptoms, was tested for covid.  Negative.  Steroid nasal spray and saline nasal spray as directed.  Mucinex.  Follow.

## 2020-04-09 NOTE — Assessment & Plan Note (Signed)
Has been seeing psychiatry - has been followed by [facility].  Per pt, with her new insurance, will be out of network.  Plans to f/u with [facility].  Stable.

## 2020-04-09 NOTE — Assessment & Plan Note (Signed)
On lipitor.  Low cholesterol diet and exercise.  Follow lipid panel and liver function tests.   

## 2020-04-09 NOTE — Assessment & Plan Note (Signed)
S/p stent placement.  No chest pain.  Continue risk factor modification.  On plavix.  Continue lipitor.

## 2020-04-09 NOTE — Assessment & Plan Note (Signed)
Blood pressure under good control.  Continues on amlodipine.  Follow pressures.  Follow metabolic panel.

## 2020-04-11 DIAGNOSIS — G4733 Obstructive sleep apnea (adult) (pediatric): Secondary | ICD-10-CM | POA: Diagnosis not present

## 2020-04-12 DIAGNOSIS — R509 Fever, unspecified: Secondary | ICD-10-CM | POA: Diagnosis not present

## 2020-04-12 DIAGNOSIS — Z1389 Encounter for screening for other disorder: Secondary | ICD-10-CM | POA: Diagnosis not present

## 2020-04-12 DIAGNOSIS — R0602 Shortness of breath: Secondary | ICD-10-CM | POA: Diagnosis not present

## 2020-04-12 DIAGNOSIS — G4733 Obstructive sleep apnea (adult) (pediatric): Secondary | ICD-10-CM | POA: Diagnosis not present

## 2020-04-12 DIAGNOSIS — F17218 Nicotine dependence, cigarettes, with other nicotine-induced disorders: Secondary | ICD-10-CM | POA: Diagnosis not present

## 2020-04-12 DIAGNOSIS — J209 Acute bronchitis, unspecified: Secondary | ICD-10-CM | POA: Diagnosis not present

## 2020-04-12 DIAGNOSIS — F330 Major depressive disorder, recurrent, mild: Secondary | ICD-10-CM | POA: Diagnosis not present

## 2020-04-12 DIAGNOSIS — J439 Emphysema, unspecified: Secondary | ICD-10-CM | POA: Diagnosis not present

## 2020-04-12 DIAGNOSIS — R05 Cough: Secondary | ICD-10-CM | POA: Diagnosis not present

## 2020-04-17 ENCOUNTER — Other Ambulatory Visit: Payer: Self-pay | Admitting: Urology

## 2020-04-17 DIAGNOSIS — E782 Mixed hyperlipidemia: Secondary | ICD-10-CM | POA: Diagnosis not present

## 2020-04-17 DIAGNOSIS — G4733 Obstructive sleep apnea (adult) (pediatric): Secondary | ICD-10-CM | POA: Diagnosis not present

## 2020-04-17 DIAGNOSIS — I25119 Atherosclerotic heart disease of native coronary artery with unspecified angina pectoris: Secondary | ICD-10-CM | POA: Diagnosis not present

## 2020-04-17 DIAGNOSIS — I10 Essential (primary) hypertension: Secondary | ICD-10-CM | POA: Diagnosis not present

## 2020-04-17 DIAGNOSIS — I6523 Occlusion and stenosis of bilateral carotid arteries: Secondary | ICD-10-CM | POA: Diagnosis not present

## 2020-04-17 DIAGNOSIS — I471 Supraventricular tachycardia: Secondary | ICD-10-CM | POA: Diagnosis not present

## 2020-04-18 ENCOUNTER — Telehealth: Payer: Self-pay | Admitting: Internal Medicine

## 2020-04-18 MED ORDER — CYCLOBENZAPRINE HCL 10 MG PO TABS: ORAL_TABLET | ORAL | 0 refills | Status: DC

## 2020-04-18 NOTE — Telephone Encounter (Signed)
Pt called in to said that she could not get her medication [employer] called and said that Dr.Scott did not refill order for cyclobenzaprine (FLEXERIL) 10 MG tablet

## 2020-04-19 MED ORDER — ATORVASTATIN CALCIUM 20 MG PO TABS: 20.0000 mg | ORAL_TABLET | Freq: Every evening | ORAL | 0 refills | Status: DC

## 2020-04-19 MED ORDER — ISOSORBIDE MONONITRATE ER 30 MG PO TB24: 30.0000 mg | ORAL_TABLET | Freq: Every day | ORAL | 12 refills | Status: DC

## 2020-04-19 NOTE — Telephone Encounter (Signed)
Pt called to check on refill status of the flexril also needs the following sent [employer]  isosorbide mononitrate (IMDUR) 30 MG 24 hr tablet atorvastatin (LIPITOR) 20 MG tablet

## 2020-04-19 NOTE — Addendum Note (Signed)
Addended byElpidio Galea T on: 04/19/2020 02:46 PM   Modules accepted: Orders

## 2020-04-21 ENCOUNTER — Telehealth: Payer: Self-pay | Admitting: Internal Medicine

## 2020-04-21 MED ORDER — PROPRANOLOL HCL 40 MG PO TABS: ORAL_TABLET | ORAL | 0 refills | Status: DC

## 2020-04-21 MED ORDER — ALBUTEROL SULFATE HFA 108 (90 BASE) MCG/ACT IN AERS: INHALATION_SPRAY | RESPIRATORY_TRACT | 0 refills | Status: DC

## 2020-04-21 MED ORDER — CYCLOBENZAPRINE HCL 10 MG PO TABS: ORAL_TABLET | ORAL | 0 refills | Status: DC

## 2020-04-21 MED ORDER — VICTOZA 18 MG/3ML ~~LOC~~ SOPN: PEN_INJECTOR | SUBCUTANEOUS | 0 refills | Status: DC

## 2020-04-21 MED ORDER — TRAZODONE HCL 100 MG PO TABS: 200.0000 mg | ORAL_TABLET | Freq: Every day | ORAL | 1 refills | Status: DC

## 2020-04-21 MED ORDER — PANTOPRAZOLE SODIUM 40 MG PO TBEC: 40.0000 mg | DELAYED_RELEASE_TABLET | Freq: Two times a day (BID) | ORAL | 0 refills | Status: DC

## 2020-04-21 MED ORDER — ESCITALOPRAM OXALATE 10 MG PO TABS: 15.0000 mg | ORAL_TABLET | Freq: Every day | ORAL | 1 refills | Status: DC

## 2020-04-21 MED ORDER — DULOXETINE HCL 60 MG PO CPEP: 60.0000 mg | ORAL_CAPSULE | Freq: Every day | ORAL | 1 refills | Status: DC

## 2020-04-21 MED ORDER — CLOPIDOGREL BISULFATE 75 MG PO TABS: 75.0000 mg | ORAL_TABLET | Freq: Every day | ORAL | 0 refills | Status: DC

## 2020-04-21 MED ORDER — METFORMIN HCL ER 500 MG PO TB24: 1000.0000 mg | ORAL_TABLET | Freq: Two times a day (BID) | ORAL | 0 refills | Status: DC

## 2020-04-21 MED ORDER — AMLODIPINE BESYLATE 10 MG PO TABS: 10.0000 mg | ORAL_TABLET | Freq: Every day | ORAL | 0 refills | Status: DC

## 2020-04-21 MED ORDER — FLUTICASONE-SALMETEROL 250-50 MCG/DOSE IN AEPB: INHALATION_SPRAY | RESPIRATORY_TRACT | 1 refills | Status: DC

## 2020-04-21 MED ORDER — TELMISARTAN 40 MG PO TABS: 40.0000 mg | ORAL_TABLET | Freq: Every day | ORAL | 0 refills | Status: DC

## 2020-04-21 NOTE — Telephone Encounter (Signed)
Pt needs refill on all meds sent to [facility]

## 2020-05-01 DIAGNOSIS — R0600 Dyspnea, unspecified: Secondary | ICD-10-CM | POA: Diagnosis not present

## 2020-05-01 DIAGNOSIS — R05 Cough: Secondary | ICD-10-CM | POA: Diagnosis not present

## 2020-05-01 DIAGNOSIS — F17200 Nicotine dependence, unspecified, uncomplicated: Secondary | ICD-10-CM | POA: Diagnosis not present

## 2020-05-01 DIAGNOSIS — J849 Interstitial pulmonary disease, unspecified: Secondary | ICD-10-CM | POA: Diagnosis not present

## 2020-05-01 DIAGNOSIS — G4733 Obstructive sleep apnea (adult) (pediatric): Secondary | ICD-10-CM | POA: Diagnosis not present

## 2020-05-01 DIAGNOSIS — Z01818 Encounter for other preprocedural examination: Secondary | ICD-10-CM | POA: Diagnosis not present

## 2020-05-01 DIAGNOSIS — F17210 Nicotine dependence, cigarettes, uncomplicated: Secondary | ICD-10-CM | POA: Diagnosis not present

## 2020-05-02 DIAGNOSIS — Z01818 Encounter for other preprocedural examination: Secondary | ICD-10-CM | POA: Diagnosis not present

## 2020-05-12 DIAGNOSIS — G4733 Obstructive sleep apnea (adult) (pediatric): Secondary | ICD-10-CM | POA: Diagnosis not present

## 2020-05-20 ENCOUNTER — Other Ambulatory Visit: Payer: Self-pay | Admitting: Urology

## 2020-06-05 ENCOUNTER — Other Ambulatory Visit: Payer: Self-pay | Admitting: Internal Medicine

## 2020-06-08 ENCOUNTER — Telehealth: Payer: Self-pay | Admitting: Internal Medicine

## 2020-06-08 NOTE — Telephone Encounter (Signed)
Pt called in and stated that pharmacy will be faxing over a new precripation for true metrix test strips

## 2020-06-11 DIAGNOSIS — G4733 Obstructive sleep apnea (adult) (pediatric): Secondary | ICD-10-CM | POA: Diagnosis not present

## 2020-06-15 ENCOUNTER — Other Ambulatory Visit: Payer: Self-pay

## 2020-06-15 MED ORDER — GLUCOSE BLOOD VI STRP: ORAL_STRIP | 12 refills | Status: DC

## 2020-06-15 NOTE — Telephone Encounter (Signed)
Pt called in stated that Dr.Scott need to fax over to [facility] the prescription for True Metrix strips

## 2020-06-15 NOTE — Telephone Encounter (Signed)
Sent in rx electronically to [facility].

## 2020-06-16 NOTE — Telephone Encounter (Signed)
Pt called in needs Dr.Scott to sent in prescription for meter and test strips,lancets to [facility] she ask if she could do this as soon as possible

## 2020-06-16 NOTE — Telephone Encounter (Signed)
Script written for you to sign. They already have the script for test strips.

## 2020-06-16 NOTE — Telephone Encounter (Signed)
Signed and placed in box.   

## 2020-06-16 NOTE — Telephone Encounter (Signed)
Faxed to [employer]

## 2020-06-18 ENCOUNTER — Other Ambulatory Visit: Payer: Self-pay | Admitting: Internal Medicine

## 2020-06-19 ENCOUNTER — Telehealth: Payer: Self-pay | Admitting: Internal Medicine

## 2020-06-19 MED ORDER — NYSTATIN 100000 UNIT/GM EX POWD: CUTANEOUS | 0 refills | Status: DC

## 2020-06-19 MED ORDER — PROPRANOLOL HCL 40 MG PO TABS: ORAL_TABLET | ORAL | 0 refills | Status: DC

## 2020-06-19 NOTE — Telephone Encounter (Signed)
Pt called and said she also needs famotidine 40mg  refilled also and sent to [facility].

## 2020-06-19 NOTE — Telephone Encounter (Signed)
Error put message in medication refill encounter.

## 2020-06-20 NOTE — Telephone Encounter (Signed)
Pt has called twice in the last hour for her refill to [facility] she stated that they have not received the order

## 2020-06-21 ENCOUNTER — Other Ambulatory Visit: Payer: Self-pay

## 2020-06-21 ENCOUNTER — Telehealth: Payer: Self-pay | Admitting: Internal Medicine

## 2020-06-21 DIAGNOSIS — M79672 Pain in left foot: Secondary | ICD-10-CM | POA: Diagnosis not present

## 2020-06-21 DIAGNOSIS — D2372 Other benign neoplasm of skin of left lower limb, including hip: Secondary | ICD-10-CM | POA: Diagnosis not present

## 2020-06-21 DIAGNOSIS — E1142 Type 2 diabetes mellitus with diabetic polyneuropathy: Secondary | ICD-10-CM | POA: Diagnosis not present

## 2020-06-21 DIAGNOSIS — M21622 Bunionette of left foot: Secondary | ICD-10-CM | POA: Diagnosis not present

## 2020-06-21 DIAGNOSIS — M79671 Pain in right foot: Secondary | ICD-10-CM | POA: Diagnosis not present

## 2020-06-21 DIAGNOSIS — M7732 Calcaneal spur, left foot: Secondary | ICD-10-CM | POA: Diagnosis not present

## 2020-06-21 DIAGNOSIS — M7741 Metatarsalgia, right foot: Secondary | ICD-10-CM | POA: Diagnosis not present

## 2020-06-21 DIAGNOSIS — M216X1 Other acquired deformities of right foot: Secondary | ICD-10-CM | POA: Diagnosis not present

## 2020-06-21 DIAGNOSIS — L851 Acquired keratosis [keratoderma] palmaris et plantaris: Secondary | ICD-10-CM | POA: Diagnosis not present

## 2020-06-21 DIAGNOSIS — M2141 Flat foot [pes planus] (acquired), right foot: Secondary | ICD-10-CM | POA: Diagnosis not present

## 2020-06-21 DIAGNOSIS — M2011 Hallux valgus (acquired), right foot: Secondary | ICD-10-CM | POA: Diagnosis not present

## 2020-06-21 DIAGNOSIS — M21621 Bunionette of right foot: Secondary | ICD-10-CM | POA: Diagnosis not present

## 2020-06-21 DIAGNOSIS — M722 Plantar fascial fibromatosis: Secondary | ICD-10-CM | POA: Diagnosis not present

## 2020-06-21 MED ORDER — FAMOTIDINE 40 MG PO TABS: 40.0000 mg | ORAL_TABLET | Freq: Every day | ORAL | 1 refills | Status: DC

## 2020-06-21 MED ORDER — TELMISARTAN 40 MG PO TABS: 40.0000 mg | ORAL_TABLET | Freq: Every day | ORAL | 0 refills | Status: DC

## 2020-06-21 NOTE — Telephone Encounter (Signed)
Patient confirmed that [facility] has received her famotidine

## 2020-06-21 NOTE — Telephone Encounter (Signed)
Pt needs refill on telmisartan (MICARDIS) 40 MG tablet sent to [facility]

## 2020-06-26 ENCOUNTER — Telehealth: Payer: Self-pay | Admitting: Internal Medicine

## 2020-06-26 ENCOUNTER — Other Ambulatory Visit: Payer: Self-pay

## 2020-06-26 ENCOUNTER — Encounter: Payer: Self-pay | Admitting: Psychiatry

## 2020-06-26 ENCOUNTER — Telehealth (INDEPENDENT_AMBULATORY_CARE_PROVIDER_SITE_OTHER): Payer: Medicare PPO | Admitting: Psychiatry

## 2020-06-26 DIAGNOSIS — G4701 Insomnia due to medical condition: Secondary | ICD-10-CM | POA: Diagnosis not present

## 2020-06-26 DIAGNOSIS — F3342 Major depressive disorder, recurrent, in full remission: Secondary | ICD-10-CM | POA: Insufficient documentation

## 2020-06-26 DIAGNOSIS — F17200 Nicotine dependence, unspecified, uncomplicated: Secondary | ICD-10-CM | POA: Diagnosis not present

## 2020-06-26 DIAGNOSIS — F419 Anxiety disorder, unspecified: Secondary | ICD-10-CM | POA: Diagnosis not present

## 2020-06-26 MED ORDER — CYCLOBENZAPRINE HCL 10 MG PO TABS: ORAL_TABLET | ORAL | 0 refills | Status: DC
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
## 2020-06-26 NOTE — Telephone Encounter (Signed)
Pt needs a refill on cyclobenzaprine (FLEXERIL) 10 MG tablet sent to [facility]

## 2020-06-29 ENCOUNTER — Other Ambulatory Visit: Payer: Self-pay | Admitting: Internal Medicine

## 2020-06-29 MED ORDER — NYSTATIN 100000 UNIT/GM EX POWD: CUTANEOUS | 0 refills | Status: DC

## 2020-06-29 NOTE — Telephone Encounter (Signed)
Pt is requesting a refill on nystatin (NYSTATIN) powder sent to [facility]

## 2020-06-30 NOTE — Telephone Encounter (Signed)
Pt called to let us know that [facility] is faxing over prior authorization and to be on the look out for it so she can get prescription.

## 2020-07-03 NOTE — Telephone Encounter (Signed)
Medication has been approved through 11/24/2020.

## 2020-07-04 DIAGNOSIS — J310 Chronic rhinitis: Secondary | ICD-10-CM | POA: Diagnosis not present

## 2020-07-04 DIAGNOSIS — R942 Abnormal results of pulmonary function studies: Secondary | ICD-10-CM | POA: Diagnosis not present

## 2020-07-04 DIAGNOSIS — R0600 Dyspnea, unspecified: Secondary | ICD-10-CM | POA: Diagnosis not present

## 2020-07-04 DIAGNOSIS — G4733 Obstructive sleep apnea (adult) (pediatric): Secondary | ICD-10-CM | POA: Diagnosis not present

## 2020-07-06 ENCOUNTER — Other Ambulatory Visit (INDEPENDENT_AMBULATORY_CARE_PROVIDER_SITE_OTHER): Payer: Medicare PPO

## 2020-07-06 ENCOUNTER — Other Ambulatory Visit: Payer: Self-pay

## 2020-07-06 DIAGNOSIS — E1159 Type 2 diabetes mellitus with other circulatory complications: Secondary | ICD-10-CM | POA: Diagnosis not present

## 2020-07-06 DIAGNOSIS — I10 Essential (primary) hypertension: Secondary | ICD-10-CM | POA: Diagnosis not present

## 2020-07-06 DIAGNOSIS — E7800 Pure hypercholesterolemia, unspecified: Secondary | ICD-10-CM

## 2020-07-06 LAB — HEPATIC FUNCTION PANEL
ALT: 16 U/L (ref 0–35)
AST: 14 U/L (ref 0–37)
Albumin: 4 g/dL (ref 3.5–5.2)
Alkaline Phosphatase: 64 U/L (ref 39–117)
Bilirubin, Direct: 0.1 mg/dL (ref 0.0–0.3)
Total Bilirubin: 0.4 mg/dL (ref 0.2–1.2)
Total Protein: 6.7 g/dL (ref 6.0–8.3)

## 2020-07-06 LAB — BASIC METABOLIC PANEL
BUN: 10 mg/dL (ref 6–23)
CO2: 27 mEq/L (ref 19–32)
Calcium: 9.5 mg/dL (ref 8.4–10.5)
Chloride: 102 mEq/L (ref 96–112)
Creatinine, Ser: 0.79 mg/dL (ref 0.40–1.20)
GFR: 73.19 mL/min (ref >60.00)
Glucose, Bld: 130 mg/dL — ABNORMAL HIGH (ref 70–99)
Potassium: 4.2 mEq/L (ref 3.5–5.1)
Sodium: 137 mEq/L (ref 135–145)

## 2020-07-06 LAB — LIPID PANEL
Cholesterol: 118 mg/dL (ref 0–200)
HDL: 32.5 mg/dL — ABNORMAL LOW (ref >39.00)
LDL Cholesterol: 53 mg/dL (ref 0–99)
NonHDL: 85.99
Total CHOL/HDL Ratio: 4
Triglycerides: 166 mg/dL — ABNORMAL HIGH (ref 0.0–149.0)
VLDL: 33.2 mg/dL (ref 0.0–40.0)

## 2020-07-06 LAB — HEMOGLOBIN A1C: Hgb A1c MFr Bld: 6.2 % (ref 4.6–6.5)

## 2020-07-12 DIAGNOSIS — G4733 Obstructive sleep apnea (adult) (pediatric): Secondary | ICD-10-CM | POA: Diagnosis not present

## 2020-07-13 ENCOUNTER — Encounter: Payer: Self-pay | Admitting: Internal Medicine

## 2020-07-13 ENCOUNTER — Telehealth: Payer: Self-pay | Admitting: Internal Medicine

## 2020-07-13 ENCOUNTER — Other Ambulatory Visit: Payer: Self-pay

## 2020-07-13 ENCOUNTER — Ambulatory Visit (INDEPENDENT_AMBULATORY_CARE_PROVIDER_SITE_OTHER): Payer: Medicare PPO | Admitting: Internal Medicine

## 2020-07-13 VITALS — BP 116/62 | HR 71 | Temp 98.8°F | Resp 16 | Ht 64.0 in | Wt 209.0 lb

## 2020-07-13 DIAGNOSIS — Z23 Encounter for immunization: Secondary | ICD-10-CM

## 2020-07-13 DIAGNOSIS — G4733 Obstructive sleep apnea (adult) (pediatric): Secondary | ICD-10-CM | POA: Diagnosis not present

## 2020-07-13 DIAGNOSIS — J849 Interstitial pulmonary disease, unspecified: Secondary | ICD-10-CM

## 2020-07-13 DIAGNOSIS — R42 Dizziness and giddiness: Secondary | ICD-10-CM | POA: Diagnosis not present

## 2020-07-13 DIAGNOSIS — E7800 Pure hypercholesterolemia, unspecified: Secondary | ICD-10-CM | POA: Diagnosis not present

## 2020-07-13 DIAGNOSIS — D509 Iron deficiency anemia, unspecified: Secondary | ICD-10-CM

## 2020-07-13 DIAGNOSIS — F17200 Nicotine dependence, unspecified, uncomplicated: Secondary | ICD-10-CM

## 2020-07-13 DIAGNOSIS — K219 Gastro-esophageal reflux disease without esophagitis: Secondary | ICD-10-CM

## 2020-07-13 DIAGNOSIS — L989 Disorder of the skin and subcutaneous tissue, unspecified: Secondary | ICD-10-CM

## 2020-07-13 DIAGNOSIS — F320 Major depressive disorder, single episode, mild: Secondary | ICD-10-CM | POA: Diagnosis not present

## 2020-07-13 DIAGNOSIS — I10 Essential (primary) hypertension: Secondary | ICD-10-CM

## 2020-07-13 DIAGNOSIS — I471 Supraventricular tachycardia: Secondary | ICD-10-CM

## 2020-07-13 DIAGNOSIS — F32A Depression, unspecified: Secondary | ICD-10-CM

## 2020-07-13 MED ORDER — MUPIROCIN 2 % EX OINT: TOPICAL_OINTMENT | CUTANEOUS | 0 refills | Status: DC

## 2020-07-13 NOTE — Progress Notes (Signed)
Patient ID: Kathryn Friedman, female   DOB: July 27, 1956, 64 y.o.   MRN: [id number]   Subjective:    Patient ID: Kathryn Friedman, female    DOB: 06-02-1956, 64 y.o.   MRN: [id number]  HPI This visit occurred during the SARS-CoV-2 public health emergency.  Safety protocols were in place, including screening questions prior to the visit, additional usage of staff PPE, and extensive cleaning of exam room while observing appropriate contact time as indicated for disinfecting solutions.  Patient here for a scheduled follow up.  She reports noticied some increased dizziness.  Had episode last night - sitting in chair - room started spinning.  She would close her eyes.  States would only last a few seconds to minute.  Intermittent.  No headache.  Taking aspirin and plavix.  No chest pain.  Does reports occasionally will notice increased heart rate. Breathing overall stable.  Using advair and rescue inhaler.  No nausea or vomiting.  Bowels ok.  Blood pressure doing well - averaging 120/60.    Past Medical History:  Diagnosis Date  . Anemia   . Arthritis    back and knees  . Asthma   . Blood in stool   . Chronic diarrhea   . COPD (chronic obstructive pulmonary disease) ([place])   . Coronary artery disease    s/p stent placement 06/25/06  . Depression    secondary to the death of her husband (died 84)  . Diabetes mellitus without complication ([place]) Diagnosed 05/2008  . Diverticulitis   . Diverticulosis   . Dizzinesses   . Dysphagia   . Fatty infiltration of liver   . GERD (gastroesophageal reflux disease)   . Headache   . Hypertension   . Hypertriglyceridemia   . ILD (interstitial lung disease) ([place])   . Lower abdominal pain   . Lump in the abdomen   . PSVT (paroxysmal supraventricular tachycardia) ([place])   . Sleep apnea    on CPAP  . Spastic colon   . Tobacco abuse   . Venous insufficiency of both lower extremities    Past Surgical History:  Procedure Laterality Date  .  ABDOMINAL HYSTERECTOMY  with left ovary in place 1996  . APPENDECTOMY  1985  . BREAST BIOPSY Left 10/14/2017   calcs bx, fibrosis giant cell reaction and chronic inflammation, negative for malignancy.   Marland Kitchen CARDIAC CATHETERIZATION  2007   stents  . CESAREAN SECTION  1984  . CHOLECYSTECTOMY  1985  . COLONOSCOPY WITH PROPOFOL N/A 09/13/2016   Procedure: COLONOSCOPY WITH PROPOFOL;  Surgeon: Manya Silvas, MD;  Location: [facility] ENDOSCOPY;  Service: Endoscopy;  Laterality: N/A;  . COLONOSCOPY WITH PROPOFOL N/A 11/09/2018   Procedure: COLONOSCOPY WITH PROPOFOL;  Surgeon: Manya Silvas, MD;  Location: [facility] ENDOSCOPY;  Service: Endoscopy;  Laterality: N/A;  . COLONOSCOPY WITH PROPOFOL N/A 03/29/2020   Procedure: COLONOSCOPY WITH PROPOFOL;  Surgeon: Robert Bellow, MD;  Location: [facility] ENDOSCOPY;  Service: Endoscopy;  Laterality: N/A;  . ESOPHAGOGASTRODUODENOSCOPY (EGD) WITH PROPOFOL N/A 02/02/2018   Procedure: ESOPHAGOGASTRODUODENOSCOPY (EGD) WITH PROPOFOL;  Surgeon: Manya Silvas, MD;  Location: [facility] ENDOSCOPY;  Service: Endoscopy;  Laterality: N/A;  . ESOPHAGOGASTRODUODENOSCOPY (EGD) WITH PROPOFOL N/A 03/29/2020   Procedure: ESOPHAGOGASTRODUODENOSCOPY (EGD) WITH PROPOFOL;  Surgeon: Robert Bellow, MD;  Location: [facility] ENDOSCOPY;  Service: Endoscopy;  Laterality: N/A;  . JOINT REPLACEMENT     bilateral knee replacements  . KNEE ARTHROSCOPY  Arthroscopic left knee surgery   . KNEE SURGERY  status  post knee surgey   . LEFT HEART CATH AND CORONARY ANGIOGRAPHY Left 05/14/2018   Procedure: LEFT HEART CATH AND CORONARY ANGIOGRAPHY;  Surgeon: Corey Skains, MD;  Location: [facility] LAB;  Service: Cardiovascular;  Laterality: Left;  . REPLACEMENT TOTAL KNEE  ([facility])  . SHOULDER SURGERY  shoulder operation secondary to a torn tendon   Family History  Problem Relation Age of Onset  . Other Mother        Hit by a fire truck and has had multiple operations on her back , and has history of  MVP   . Mitral valve prolapse Mother   . Lung cancer Mother   . Depression Mother   . Heart disease Father        myocardial infarction and is status post bypass surgery  . Mitral valve prolapse Sister   . Bipolar disorder Sister   . Hepatitis C Brother   . Cirrhosis Brother   . Colon cancer Paternal Aunt   . Breast cancer Neg Hx    Social History   Socioeconomic History  . Marital status: Married    Spouse name: Not on file  . Number of children: 1  . Years of education: Not on file  . Highest education level: Not on file  Occupational History    Employer: [employer]  Tobacco Use  . Smoking status: Current Every Day Smoker    Packs/day: 1.50    Years: 45.00    Pack years: 67.50    Types: Cigarettes  . Smokeless tobacco: Never Used  Vaping Use  . Vaping Use: Former  Substance and Sexual Activity  . Alcohol use: No    Alcohol/week: 0.0 standard drinks  . Drug use: No  . Sexual activity: Not on file  Other Topics Concern  . Not on file  Social History Narrative  . Not on file   Social Determinants of Health   Financial Resource Strain:   . Difficulty of Paying Living Expenses: Not on file  Food Insecurity:   . Worried About Charity fundraiser in the Last Year: Not on file  . Ran Out of Food in the Last Year: Not on file  Transportation Needs:   . Lack of Transportation (Medical): Not on file  . Lack of Transportation (Non-Medical): Not on file  Physical Activity:   . Days of Exercise per Week: Not on file  . Minutes of Exercise per Session: Not on file  Stress:   . Feeling of Stress : Not on file  Social Connections:   . Frequency of Communication with Friends and Family: Not on file  . Frequency of Social Gatherings with Friends and Family: Not on file  . Attends Religious Services: Not on file  . Active Member of Clubs or Organizations: Not on file  . Attends Archivist Meetings: Not on file  . Marital Status: Not on file    Outpatient Encounter  Medications as of 07/13/2020  Medication Sig  . albuterol (VENTOLIN HFA) 108 (90 Base) MCG/ACT inhaler INHALE 2 PUFFS FOUR TIMES A DAY  . amLODipine (NORVASC) 10 MG tablet TAKE 1 TABLET (10 MG TOTAL) BY MOUTH DAILY.  Marland Kitchen aspirin 81 MG tablet Take 81 mg by mouth daily.   Marland Kitchen atorvastatin (LIPITOR) 20 MG tablet TAKE 1 TABLET (20 MG TOTAL) BY MOUTH EVERY EVENING.  . Blood Glucose Monitoring Suppl (TRUE METRIX METER) w/Device KIT   . budesonide-formoterol (SYMBICORT) 160-4.5 MCG/ACT inhaler Inhale into the lungs.  Marland Kitchen  CALCIUM PO Take 1 tablet by mouth daily.  . clopidogrel (PLAVIX) 75 MG tablet Take 1 tablet (75 mg total) by mouth daily.  . colestipol (COLESTID) 1 g tablet Take by mouth.  . cyclobenzaprine (FLEXERIL) 10 MG tablet As instructed  . DULoxetine (CYMBALTA) 60 MG capsule Take 1 capsule (60 mg total) by mouth daily.  Marland Kitchen escitalopram (LEXAPRO) 10 MG tablet Take 1.5 tablets (15 mg total) by mouth daily.  . famotidine (PEPCID) 40 MG tablet Take 1 tablet (40 mg total) by mouth daily.  . Fluticasone-Salmeterol (ADVAIR DISKUS) 250-50 MCG/DOSE AEPB INHALE 1 PUFF 2 TIMES A DAY. RINSE MOUTH AND SPIT AFTER EACH USE.  . glucose blood test strip Use as instructed to check blood sugars twice daily. Dx E11.9.  . isosorbide mononitrate (IMDUR) 30 MG 24 hr tablet Take 1 tablet (30 mg total) by mouth daily.  . Loperamide HCl (IMODIUM A-D PO) Take 2 tablets by mouth daily.  . metFORMIN (GLUCOPHAGE-XR) 500 MG 24 hr tablet Take 2 tablets (1,000 mg total) by mouth in the morning and at bedtime.  . Multiple Vitamin (MULTIVITAMIN PO) Take 1 tablet by mouth daily.  . mupirocin ointment (BACTROBAN) 2 % Apply to affected area on abdomen bid  . nystatin (NYSTATIN) powder APPLY TOPICALLY 2 TIMES A DAY  . nystatin cream (MYCOSTATIN) APPLY ONE APPLICATION TOPICALLY 2 TIMES A DAY (Patient taking differently: Apply 1 application topically 2 (two) times daily as needed (yeast infection). )  . pantoprazole (PROTONIX) 40 MG  tablet Take 1 tablet (40 mg total) by mouth 2 (two) times daily before a meal.  . propranolol (INDERAL) 40 MG tablet 1 tab daily PO  . solifenacin (VESICARE) 10 MG tablet Take 1 tablet (10 mg total) by mouth daily.  Marland Kitchen telmisartan (MICARDIS) 40 MG tablet Take 1 tablet (40 mg total) by mouth daily.  Marland Kitchen tolterodine (DETROL LA) 4 MG 24 hr capsule TAKE ONE CAPSULE BY MOUTH DAILY  . traZODone (DESYREL) 100 MG tablet Take 2 tablets (200 mg total) by mouth at bedtime.  . TRUEplus Lancets 33G MISC    No facility-administered encounter medications on file as of 07/13/2020.    Review of Systems  Constitutional: Negative for appetite change and unexpected weight change.  HENT: Negative for congestion and sinus pressure.   Respiratory: Negative for cough and chest tightness.        Breathing stable.   Cardiovascular: Negative for chest pain and leg swelling.       Some intermittent increased heart rate as outlined.    Gastrointestinal: Negative for abdominal pain, diarrhea, nausea and vomiting.  Genitourinary: Negative for difficulty urinating and dysuria.  Musculoskeletal: Negative for joint swelling and myalgias.  Skin: Negative for color change and rash.  Neurological: Negative for headaches.       Dizziness as outlined.    Psychiatric/Behavioral: Negative for agitation and dysphoric mood.       Objective:    Physical Exam Vitals reviewed.  Constitutional:      General: She is not in acute distress.    Appearance: Normal appearance.  HENT:     Head: Normocephalic and atraumatic.     Right Ear: External ear normal.     Left Ear: External ear normal.  Eyes:     General: No scleral icterus.       Right eye: No discharge.        Left eye: No discharge.     Conjunctiva/sclera: Conjunctivae normal.  Neck:     Thyroid:  No thyromegaly.  Cardiovascular:     Rate and Rhythm: Normal rate and regular rhythm.  Pulmonary:     Effort: No respiratory distress.     Breath sounds: Normal breath  sounds. No wheezing.  Abdominal:     General: Bowel sounds are normal.     Palpations: Abdomen is soft.     Tenderness: There is no abdominal tenderness.  Musculoskeletal:        General: No swelling or tenderness.     Cervical back: Neck supple. No tenderness.  Lymphadenopathy:     Cervical: No cervical adenopathy.  Skin:    Findings: No erythema or rash.  Neurological:     Mental Status: She is alert.  Psychiatric:        Mood and Affect: Mood normal.        Behavior: Behavior normal.     BP 116/62   Pulse 71   Temp 98.8 F (37.1 C) (Oral)   Resp 16   Ht '5\' 4"'  (1.626 m)   Wt 209 lb (94.8 kg)   SpO2 96%   BMI 35.87 kg/m  Wt Readings from Last 3 Encounters:  07/20/20 212 lb (96.2 kg)  07/13/20 209 lb (94.8 kg)  04/03/20 213 lb 3.2 oz (96.7 kg)     Lab Results  Component Value Date   WBC 8.5 09/06/2019   HGB 14.9 09/06/2019   HCT 44.5 09/06/2019   PLT 174.0 09/06/2019   GLUCOSE 130 (H) 07/06/2020   CHOL 118 07/06/2020   TRIG 166.0 (H) 07/06/2020   HDL 32.50 (L) 07/06/2020   LDLCALC 53 07/06/2020   ALT 16 07/06/2020   AST 14 07/06/2020   NA 137 07/06/2020   K 4.2 07/06/2020   CL 102 07/06/2020   CREATININE 0.79 07/06/2020   BUN 10 07/06/2020   CO2 27 07/06/2020   TSH 1.56 03/17/2020   HGBA1C 6.2 07/06/2020   MICROALBUR 0.9 03/17/2020       Assessment & Plan:   Problem List Items Addressed This Visit    Tobacco use disorder    Discussed the importance of quitting smoking.  Discussed the need to quit.  Declines to stop at this time.  Follow.        Skin lesion    Skin lesion - abdominal wall. Bactroban.  Follow.        PSVT (paroxysmal supraventricular tachycardia) ([place])    Documented history.  Describes noticing occasionally - increased heart rate.  Has seen cardiology. Overall feels from a cardiac standpoint things are stable.  Wants to monitor.       Obstructive sleep apnea    CPAP.       Mild depression ([place])    Seeing psychiatry.   Doing well on current medication regimen.  Follow.        Iron deficiency anemia    Follow cbc and iron studies.        ILD (interstitial lung disease) ([facility])    Breathing stable.  Continue inhalers.        Hypercholesterolemia    On lipitor.  Low cholesterol diet and exercise.  Follow lipid panel and liver function tests.        GERD (gastroesophageal reflux disease)    Upper symptoms controlled.  On protonix.       Essential hypertension, benign    Blood pressure under good control.  On amlodipine and micardis.  Follow pressures.  Follow metabolic panel.       Dizziness - Primary  Intermittent dizziness as outlined.  Described as room spinning.  Reproducible on exam.  Discussed further evaluation and w/up.  Wants to hold on MRI.  Agreeable to ENT referral.        Relevant Orders   Ambulatory referral to ENT    Other Visit Diagnoses    Need for immunization against influenza       Relevant Orders   Flu Vaccine QUAD 36+ mos IM (Completed)       Einar Pheasant, MD

## 2020-07-13 NOTE — Telephone Encounter (Signed)
Pt states that she would prefer to be referred to Dr Tami Ribas for ENT

## 2020-07-13 NOTE — Telephone Encounter (Signed)
You saw this patient today. Would like to see Dr Tami Ribas for ENT

## 2020-07-14 NOTE — Telephone Encounter (Signed)
Referral already placed and authorized with Dr Tami Ribas.

## 2020-07-18 ENCOUNTER — Telehealth: Payer: Self-pay | Admitting: Internal Medicine

## 2020-07-18 NOTE — Telephone Encounter (Signed)
Patient screened per protocol and scheduled.

## 2020-07-18 NOTE — Telephone Encounter (Signed)
Patient called in stated that she got her flu shot on her last visit about 5 days ago and her arm is still hurting and she stated that she has a boil in her growing area

## 2020-07-18 NOTE — Telephone Encounter (Signed)
Spoken with patient, she stated her arm is still sore in her shoulder but no more redness and swelling. Patient does not have a fever, chills, nausea, vomiting, and SOB.  Patient stated she is unsure if it is a boil but she has a painful lump below abdomen and above her groin. Patient stated she usually has a boil but then they pop and go away on their own. She is taking tylenol for sx. Patient wants appointment with Dr Nicki Reaper only. Please advise.

## 2020-07-18 NOTE — Telephone Encounter (Signed)
Please triage her to get more information.

## 2020-07-18 NOTE — Telephone Encounter (Signed)
If passes screening, I can work her in on 03/20/20 - I have 8:00.  I am not here for part of day tomorrow.  If acute issues, will need to be seen earlier.

## 2020-07-19 NOTE — Telephone Encounter (Signed)
Patient has been using warm compresses on the area and is draining. She has canceled appt and continue to monitor

## 2020-07-19 NOTE — Telephone Encounter (Signed)
Pt wanted to know if she needed to keep appt since the boil is draining now

## 2020-07-19 NOTE — Telephone Encounter (Signed)
Patient called back to check if she needs to keep appt for boil on Thursday 8-26

## 2020-07-20 ENCOUNTER — Other Ambulatory Visit: Payer: Self-pay

## 2020-07-20 ENCOUNTER — Ambulatory Visit (INDEPENDENT_AMBULATORY_CARE_PROVIDER_SITE_OTHER): Payer: Medicare PPO | Admitting: Internal Medicine

## 2020-07-20 DIAGNOSIS — L0292 Furuncle, unspecified: Secondary | ICD-10-CM | POA: Insufficient documentation

## 2020-07-20 DIAGNOSIS — L02211 Cutaneous abscess of abdominal wall: Secondary | ICD-10-CM | POA: Diagnosis not present

## 2020-07-20 DIAGNOSIS — E1159 Type 2 diabetes mellitus with other circulatory complications: Secondary | ICD-10-CM

## 2020-07-20 MED ORDER — DOXYCYCLINE HYCLATE 100 MG PO TABS: 100.0000 mg | ORAL_TABLET | Freq: Two times a day (BID) | ORAL | 0 refills | Status: DC
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
## 2020-07-20 NOTE — Patient Instructions (Addendum)
Take a probiotic daily while on antibiotics and for two weeks after completing antibiotics.

## 2020-07-22 ENCOUNTER — Other Ambulatory Visit: Payer: Self-pay | Admitting: Internal Medicine

## 2020-07-23 ENCOUNTER — Encounter: Payer: Self-pay | Admitting: Internal Medicine

## 2020-07-23 ENCOUNTER — Other Ambulatory Visit: Payer: Self-pay | Admitting: Internal Medicine

## 2020-07-23 DIAGNOSIS — L989 Disorder of the skin and subcutaneous tissue, unspecified: Secondary | ICD-10-CM | POA: Insufficient documentation

## 2020-07-23 NOTE — Assessment & Plan Note (Signed)
Follow cbc and iron studies.  

## 2020-07-23 NOTE — Assessment & Plan Note (Signed)
Documented history.  Describes noticing occasionally - increased heart rate.  Has seen cardiology. Overall feels from a cardiac standpoint things are stable.  Wants to monitor.

## 2020-07-23 NOTE — Assessment & Plan Note (Signed)
Skin lesion - abdominal wall. Bactroban.  Follow.

## 2020-07-23 NOTE — Assessment & Plan Note (Signed)
Discussed the importance of quitting smoking.  Discussed the need to quit.  Declines to stop at this time.  Follow.

## 2020-07-23 NOTE — Assessment & Plan Note (Signed)
Blood pressure under good control.  On amlodipine and micardis.  Follow pressures.  Follow metabolic panel.

## 2020-07-23 NOTE — Assessment & Plan Note (Signed)
Upper symptoms controlled.  On protonix.  

## 2020-07-23 NOTE — Assessment & Plan Note (Signed)
Intermittent dizziness as outlined.  Described as room spinning.  Reproducible on exam.  Discussed further evaluation and w/up.  Wants to hold on MRI.  Agreeable to ENT referral.

## 2020-07-23 NOTE — Assessment & Plan Note (Signed)
Breathing stable.  Continue inhalers.   

## 2020-07-23 NOTE — Assessment & Plan Note (Signed)
CPAP.  

## 2020-07-23 NOTE — Assessment & Plan Note (Signed)
Seeing psychiatry.  Doing well on current medication regimen.  Follow.

## 2020-07-23 NOTE — Assessment & Plan Note (Signed)
On lipitor.  Low cholesterol diet and exercise.  Follow lipid panel and liver function tests.   

## 2020-07-24 ENCOUNTER — Telehealth: Payer: Self-pay | Admitting: Internal Medicine

## 2020-07-24 MED ORDER — CYCLOBENZAPRINE HCL 10 MG PO TABS: ORAL_TABLET | ORAL | 0 refills | Status: DC

## 2020-07-24 NOTE — Telephone Encounter (Signed)
Pt said her prescription for cyclobenzaprine went to wrong pharmacy. It should have went to [facility]. She would like this fixed and sent to correct place. She said she hasn't used medication management clinic since she got insurance.

## 2020-07-26 ENCOUNTER — Other Ambulatory Visit: Payer: Self-pay | Admitting: Unknown Physician Specialty

## 2020-07-26 ENCOUNTER — Telehealth: Payer: Self-pay | Admitting: Internal Medicine

## 2020-07-26 ENCOUNTER — Other Ambulatory Visit (HOSPITAL_COMMUNITY): Payer: Self-pay | Admitting: Unknown Physician Specialty

## 2020-07-26 DIAGNOSIS — R42 Dizziness and giddiness: Secondary | ICD-10-CM

## 2020-07-26 NOTE — Telephone Encounter (Signed)
Patient called in stated that she need a prescription for cyclobenzaprine (FLEXERIL) 10 MG tablet sent to [facility] she stated that she is almost out of her pills

## 2020-07-26 NOTE — Telephone Encounter (Signed)
Cyclobenzaprine was last refilled on 07/24/20

## 2020-07-28 ENCOUNTER — Other Ambulatory Visit: Payer: Self-pay

## 2020-07-28 MED ORDER — CYCLOBENZAPRINE HCL 10 MG PO TABS
ORAL_TABLET | ORAL | 1 refills | Status: DC
Start: 2020-07-28 — End: 2020-08-01

## 2020-07-28 NOTE — Telephone Encounter (Signed)
Patient called in again stated that she is about to be out of medicationcyclobenzaprine (FLEXERIL) 10 MG tablet because it was sent to the wrong pharmacy it was suppose to be sent to [facility].

## 2020-07-30 ENCOUNTER — Encounter: Payer: Self-pay | Admitting: Internal Medicine

## 2020-07-30 DIAGNOSIS — L02211 Cutaneous abscess of abdominal wall: Secondary | ICD-10-CM | POA: Insufficient documentation

## 2020-07-30 NOTE — Assessment & Plan Note (Signed)
Low carb diet and exercise.  States sugars are ok.  Follow.

## 2020-07-30 NOTE — Assessment & Plan Note (Signed)
Abdominal wall abscess.  Treat with warm compresses.  Doxycycline as directed.  Probiotics as directed.  Surgery referral for I&D.

## 2020-08-01 ENCOUNTER — Other Ambulatory Visit: Payer: Self-pay

## 2020-08-01 MED ORDER — CYCLOBENZAPRINE HCL 10 MG PO TABS: 10.0000 mg | ORAL_TABLET | Freq: Three times a day (TID) | ORAL | 1 refills | Status: DC | PRN

## 2020-08-01 NOTE — Telephone Encounter (Signed)
Pt states that [employer] received refill but did not receive directions. Please send over ASAP as pt is out of medication.

## 2020-08-01 NOTE — Telephone Encounter (Signed)
Rx corrected and new one sent in to [facility]

## 2020-08-01 NOTE — Telephone Encounter (Signed)
How do you want patient to take medication? Only info on rx is use as directed. Please advise.

## 2020-08-01 NOTE — Telephone Encounter (Signed)
It has been tid prn - per review of medication list.  It appears it was changed when rx written at beginning of August.  Not sure what happened. Do I need to send in another rx or do we just need to give a verbal - is tid prn.

## 2020-08-03 ENCOUNTER — Other Ambulatory Visit: Payer: Self-pay

## 2020-08-03 ENCOUNTER — Ambulatory Visit
Admission: RE | Admit: 2020-08-03 | Discharge: 2020-08-03 | Disposition: A | Discharge status: Home / Self Care | Payer: Medicare PPO | Source: Ambulatory Visit | Attending: Unknown Physician Specialty | Admitting: Unknown Physician Specialty

## 2020-08-03 DIAGNOSIS — R42 Dizziness and giddiness: Secondary | ICD-10-CM | POA: Insufficient documentation

## 2020-08-03 DIAGNOSIS — I6523 Occlusion and stenosis of bilateral carotid arteries: Secondary | ICD-10-CM | POA: Diagnosis not present

## 2020-08-12 DIAGNOSIS — G4733 Obstructive sleep apnea (adult) (pediatric): Secondary | ICD-10-CM | POA: Diagnosis not present

## 2020-08-17 DIAGNOSIS — L02211 Cutaneous abscess of abdominal wall: Secondary | ICD-10-CM | POA: Diagnosis not present

## 2020-08-19 ENCOUNTER — Other Ambulatory Visit: Payer: Self-pay | Admitting: Internal Medicine

## 2020-08-19 DIAGNOSIS — G4733 Obstructive sleep apnea (adult) (pediatric): Secondary | ICD-10-CM | POA: Diagnosis not present

## 2020-08-22 ENCOUNTER — Telehealth: Payer: Self-pay | Admitting: Internal Medicine

## 2020-08-22 NOTE — Telephone Encounter (Signed)
Patient did not request this. Pharmacy is filling.

## 2020-08-22 NOTE — Telephone Encounter (Signed)
Received refill request for lexapro. It appears she is seeing psychiatry.  They started her on this medication and are seeing her.  Please confirm they will refill medication.  Let me know if a problem.

## 2020-08-22 NOTE — Telephone Encounter (Signed)
Edit: Psychiatry is filling, not pharmacy.

## 2020-08-25 DIAGNOSIS — G4733 Obstructive sleep apnea (adult) (pediatric): Secondary | ICD-10-CM | POA: Diagnosis not present

## 2020-08-27 ENCOUNTER — Other Ambulatory Visit: Payer: Self-pay | Admitting: Internal Medicine

## 2020-09-01 DIAGNOSIS — H903 Sensorineural hearing loss, bilateral: Secondary | ICD-10-CM | POA: Diagnosis not present

## 2020-09-01 DIAGNOSIS — R42 Dizziness and giddiness: Secondary | ICD-10-CM | POA: Diagnosis not present

## 2020-09-11 DIAGNOSIS — G4733 Obstructive sleep apnea (adult) (pediatric): Secondary | ICD-10-CM | POA: Diagnosis not present

## 2020-09-13 ENCOUNTER — Telehealth: Payer: Self-pay | Admitting: Internal Medicine

## 2020-09-13 MED ORDER — CYCLOBENZAPRINE HCL 10 MG PO TABS
10.0000 mg | ORAL_TABLET | Freq: Three times a day (TID) | ORAL | 1 refills | Status: DC | PRN
Start: 2020-09-13 — End: 2020-09-14

## 2020-09-13 NOTE — Addendum Note (Signed)
Addended by: Elpidio Galea T on: 09/13/2020 01:24 PM   Modules accepted: Orders

## 2020-09-13 NOTE — Telephone Encounter (Signed)
Pt needs a refill on cyclobenzaprine (FLEXERIL) 10 MG tablet sent to [facility]  Pt is requesting a 90 day supply

## 2020-09-14 ENCOUNTER — Telehealth: Payer: Self-pay | Admitting: Urology

## 2020-09-14 ENCOUNTER — Other Ambulatory Visit: Payer: Self-pay

## 2020-09-14 ENCOUNTER — Telehealth (INDEPENDENT_AMBULATORY_CARE_PROVIDER_SITE_OTHER): Payer: Medicare PPO | Admitting: Internal Medicine

## 2020-09-14 ENCOUNTER — Encounter: Payer: Self-pay | Admitting: Internal Medicine

## 2020-09-14 DIAGNOSIS — D509 Iron deficiency anemia, unspecified: Secondary | ICD-10-CM | POA: Diagnosis not present

## 2020-09-14 DIAGNOSIS — F32A Depression, unspecified: Secondary | ICD-10-CM

## 2020-09-14 DIAGNOSIS — I2510 Atherosclerotic heart disease of native coronary artery without angina pectoris: Secondary | ICD-10-CM

## 2020-09-14 DIAGNOSIS — E7800 Pure hypercholesterolemia, unspecified: Secondary | ICD-10-CM | POA: Diagnosis not present

## 2020-09-14 DIAGNOSIS — F17200 Nicotine dependence, unspecified, uncomplicated: Secondary | ICD-10-CM

## 2020-09-14 DIAGNOSIS — G4733 Obstructive sleep apnea (adult) (pediatric): Secondary | ICD-10-CM | POA: Diagnosis not present

## 2020-09-14 DIAGNOSIS — R42 Dizziness and giddiness: Secondary | ICD-10-CM

## 2020-09-14 DIAGNOSIS — I10 Essential (primary) hypertension: Secondary | ICD-10-CM | POA: Diagnosis not present

## 2020-09-14 DIAGNOSIS — E1159 Type 2 diabetes mellitus with other circulatory complications: Secondary | ICD-10-CM

## 2020-09-14 DIAGNOSIS — J849 Interstitial pulmonary disease, unspecified: Secondary | ICD-10-CM | POA: Diagnosis not present

## 2020-09-14 DIAGNOSIS — F320 Major depressive disorder, single episode, mild: Secondary | ICD-10-CM | POA: Diagnosis not present

## 2020-09-14 DIAGNOSIS — J449 Chronic obstructive pulmonary disease, unspecified: Secondary | ICD-10-CM

## 2020-09-14 MED ORDER — TELMISARTAN 80 MG PO TABS: 80.0000 mg | ORAL_TABLET | Freq: Every day | ORAL | 1 refills | Status: DC

## 2020-09-14 MED ORDER — CYCLOBENZAPRINE HCL 10 MG PO TABS
10.0000 mg | ORAL_TABLET | Freq: Three times a day (TID) | ORAL | 0 refills | Status: DC | PRN
Start: 2020-09-14 — End: 2020-12-07

## 2020-09-14 MED ORDER — PROPRANOLOL HCL 40 MG PO TABS
ORAL_TABLET | ORAL | 1 refills | Status: DC
Start: 2020-09-14 — End: 2021-04-04

## 2020-09-14 MED ORDER — CYCLOBENZAPRINE HCL 10 MG PO TABS
10.0000 mg | ORAL_TABLET | Freq: Three times a day (TID) | ORAL | 0 refills | Status: DC | PRN
Start: 2020-09-14 — End: 2020-09-14

## 2020-09-14 MED ORDER — PANTOPRAZOLE SODIUM 40 MG PO TBEC
40.0000 mg | DELAYED_RELEASE_TABLET | Freq: Two times a day (BID) | ORAL | 1 refills | Status: DC
Start: 2020-09-14 — End: 2020-11-23

## 2020-09-14 MED ORDER — TOLTERODINE TARTRATE ER 4 MG PO CP24
4.0000 mg | ORAL_CAPSULE | Freq: Every day | ORAL | 6 refills | Status: DC
Start: 2020-09-14 — End: 2021-02-12

## 2020-09-14 MED ORDER — METFORMIN HCL ER 500 MG PO TB24
1000.0000 mg | ORAL_TABLET | Freq: Two times a day (BID) | ORAL | 1 refills | Status: DC
Start: 2020-09-14 — End: 2020-10-25

## 2020-09-14 NOTE — Telephone Encounter (Signed)
Pt needs refill of tolterodine to be sent to [facility]. Please advise.

## 2020-09-14 NOTE — Progress Notes (Signed)
Patient ID: Kathryn Friedman, female   DOB: [birth year], 64 y.o.   MRN: [id number]   Virtual Visit via telephone Note  This visit type was conducted due to national recommendations for restrictions regarding the COVID-19 pandemic (e.g. social distancing).  This format is felt to be most appropriate for this patient at this time.  All issues noted in this document were discussed and addressed.  No physical exam was performed (except for noted visual exam findings with Video Visits).   I connected with [name] by telephone and verified that I am speaking with the correct person using two identifiers. Location patient: home Location provider: work  Persons participating in the telephone visit: patient, provider  The limitations, risks, security and privacy concerns of performing an evaluation and management service by telephone and the availability of in person appointments have been discussed. It has also been Idiscussed with the patient that there may be a patient responsible charge related to this service. The patient expressed understanding and agreed to proceed.   Reason for visit: scheduled follow up.    HPI: Follow up for her diabetes, hypertension and hypercholesterolemia.  She reports she is doing well. Tries to stay active.  No chest pain or sob.  No cough or congestion.  Doing well on symbicort.  Breathing better. Not able to afford advair.  No acid reflux.  No abdominal pain.  Bowels moving.  Blood pressures averaging 130-140/70-80.  (more 140 range).  Blood sugars averaging 110-120 in am.  Feels good.  Handling stress.     ROS: See pertinent positives and negatives per HPI.  Past Medical History:  Diagnosis Date  . Anemia   . Arthritis    back and knees  . Asthma   . Blood in stool   . Chronic diarrhea   . COPD (chronic obstructive pulmonary disease) ([place])   . Coronary artery disease    s/p stent placement 06/25/06  . Depression    secondary to the death  of her husband (died 85)  . Diabetes mellitus without complication ([place]) Diagnosed 05/2008  . Diverticulitis   . Diverticulosis   . Dizzinesses   . Dysphagia   . Fatty infiltration of liver   . GERD (gastroesophageal reflux disease)   . Headache   . Hypertension   . Hypertriglyceridemia   . ILD (interstitial lung disease) ([place])   . Lower abdominal pain   . Lump in the abdomen   . PSVT (paroxysmal supraventricular tachycardia) ([place])   . Sleep apnea    on CPAP  . Spastic colon   . Tobacco abuse   . Venous insufficiency of both lower extremities     Past Surgical History:  Procedure Laterality Date  . ABDOMINAL HYSTERECTOMY  with left ovary in place 1996  . APPENDECTOMY  1985  . BREAST BIOPSY Left 10/14/2017   calcs bx, fibrosis giant cell reaction and chronic inflammation, negative for malignancy.   Marland Kitchen CARDIAC CATHETERIZATION  2007   stents  . CESAREAN SECTION  1984  . CHOLECYSTECTOMY  1985  . COLONOSCOPY WITH PROPOFOL N/A 09/13/2016   Procedure: COLONOSCOPY WITH PROPOFOL;  Surgeon: Manya Silvas, MD;  Location: [facility] ENDOSCOPY;  Service: Endoscopy;  Laterality: N/A;  . COLONOSCOPY WITH PROPOFOL N/A 11/09/2018   Procedure: COLONOSCOPY WITH PROPOFOL;  Surgeon: Manya Silvas, MD;  Location: [facility] ENDOSCOPY;  Service: Endoscopy;  Laterality: N/A;  . COLONOSCOPY WITH PROPOFOL N/A 03/29/2020   Procedure: COLONOSCOPY WITH PROPOFOL;  Surgeon: Robert Bellow, MD;  Location: [facility] ENDOSCOPY;  Service: Endoscopy;  Laterality: N/A;  . ESOPHAGOGASTRODUODENOSCOPY (EGD) WITH PROPOFOL N/A 02/02/2018   Procedure: ESOPHAGOGASTRODUODENOSCOPY (EGD) WITH PROPOFOL;  Surgeon: Manya Silvas, MD;  Location: [facility] ENDOSCOPY;  Service: Endoscopy;  Laterality: N/A;  . ESOPHAGOGASTRODUODENOSCOPY (EGD) WITH PROPOFOL N/A 03/29/2020   Procedure: ESOPHAGOGASTRODUODENOSCOPY (EGD) WITH PROPOFOL;  Surgeon: Robert Bellow, MD;  Location: [facility] ENDOSCOPY;  Service: Endoscopy;  Laterality: N/A;  . JOINT  REPLACEMENT     bilateral knee replacements  . KNEE ARTHROSCOPY  Arthroscopic left knee surgery   . KNEE SURGERY  status post knee surgey   . LEFT HEART CATH AND CORONARY ANGIOGRAPHY Left 05/14/2018   Procedure: LEFT HEART CATH AND CORONARY ANGIOGRAPHY;  Surgeon: Corey Skains, MD;  Location: [facility];  Service: Cardiovascular;  Laterality: Left;  . REPLACEMENT TOTAL KNEE  ([facility])  . SHOULDER SURGERY  shoulder operation secondary to a torn tendon    Family History  Problem Relation Age of Onset  . Other Mother        Hit by a fire truck and has had multiple operations on her back , and has history of MVP   . Mitral valve prolapse Mother   . Lung cancer Mother   . Depression Mother   . Heart disease Father        myocardial infarction and is status post bypass surgery  . Mitral valve prolapse Sister   . Bipolar disorder Sister   . Hepatitis C Brother   . Cirrhosis Brother   . Colon cancer Paternal Aunt   . Breast cancer Neg Hx     SOCIAL HX: reviewed.    Current Outpatient Medications:  .  albuterol (VENTOLIN HFA) 108 (90 Base) MCG/ACT inhaler, INHALE 2 PUFFS FOUR TIMES A DAY, Disp: 54 g, Rfl: 0 .  amLODipine (NORVASC) 10 MG tablet, TAKE 1 TABLET EVERY DAY, Disp: 90 tablet, Rfl: 0 .  aspirin 81 MG tablet, Take 81 mg by mouth daily. , Disp: , Rfl:  .  atorvastatin (LIPITOR) 20 MG tablet, TAKE 1 TABLET (20 MG TOTAL) BY MOUTH EVERY EVENING., Disp: 90 tablet, Rfl: 0 .  Blood Glucose Monitoring Suppl (TRUE METRIX METER) w/Device KIT, , Disp: , Rfl:  .  budesonide-formoterol (SYMBICORT) 160-4.5 MCG/ACT inhaler, Inhale into the lungs., Disp: , Rfl:  .  CALCIUM PO, Take 1 tablet by mouth daily., Disp: , Rfl:  .  clopidogrel (PLAVIX) 75 MG tablet, TAKE 1 TABLET (75 MG TOTAL) BY MOUTH DAILY., Disp: 90 tablet, Rfl: 0 .  colestipol (COLESTID) 1 g tablet, Take by mouth., Disp: , Rfl:  .  cyclobenzaprine (FLEXERIL) 10 MG tablet, Take 1 tablet (10 mg total) by mouth 3 (three)  times daily as needed for muscle spasms., Disp: 270 tablet, Rfl: 0 .  doxycycline (VIBRA-TABS) 100 MG tablet, Take 1 tablet (100 mg total) by mouth 2 (two) times daily., Disp: 20 tablet, Rfl: 0 .  DULoxetine (CYMBALTA) 60 MG capsule, Take 1 capsule (60 mg total) by mouth daily., Disp: 90 capsule, Rfl: 1 .  escitalopram (LEXAPRO) 10 MG tablet, TAKE 1 AND 1/2 TABLETS (15 MG TOTAL) BY MOUTH DAILY., Disp: 135 tablet, Rfl: 1 .  famotidine (PEPCID) 40 MG tablet, Take 1 tablet (40 mg total) by mouth daily., Disp: 90 tablet, Rfl: 1 .  Fluticasone-Salmeterol (ADVAIR DISKUS) 250-50 MCG/DOSE AEPB, INHALE 1 PUFF 2 TIMES A DAY. RINSE MOUTH AND SPIT AFTER EACH USE., Disp: 180 each, Rfl: 1 .  glucose blood test strip,  Use as instructed to check blood sugars twice daily. Dx E11.9., Disp: 100 each, Rfl: 12 .  isosorbide mononitrate (IMDUR) 30 MG 24 hr tablet, Take 1 tablet (30 mg total) by mouth daily., Disp: 30 tablet, Rfl: 12 .  Loperamide HCl (IMODIUM A-D PO), Take 2 tablets by mouth daily., Disp: , Rfl:  .  metFORMIN (GLUCOPHAGE-XR) 500 MG 24 hr tablet, Take 2 tablets (1,000 mg total) by mouth in the morning and at bedtime., Disp: 360 tablet, Rfl: 1 .  Multiple Vitamin (MULTIVITAMIN PO), Take 1 tablet by mouth daily., Disp: , Rfl:  .  mupirocin ointment (BACTROBAN) 2 %, Apply to affected area on abdomen bid, Disp: 22 g, Rfl: 0 .  nystatin (NYSTATIN) powder, APPLY TOPICALLY 2 TIMES A DAY, Disp: 15 g, Rfl: 0 .  nystatin cream (MYCOSTATIN), APPLY ONE APPLICATION TOPICALLY 2 TIMES A DAY (Patient taking differently: Apply 1 application topically 2 (two) times daily as needed (yeast infection). ), Disp: 30 g, Rfl: 0 .  pantoprazole (PROTONIX) 40 MG tablet, Take 1 tablet (40 mg total) by mouth 2 (two) times daily before a meal., Disp: 180 tablet, Rfl: 1 .  propranolol (INDERAL) 40 MG tablet, 1 tab daily PO, Disp: 180 tablet, Rfl: 1 .  telmisartan (MICARDIS) 80 MG tablet, Take 1 tablet (80 mg total) by mouth daily.,  Disp: 90 tablet, Rfl: 1 .  tolterodine (DETROL LA) 4 MG 24 hr capsule, Take 1 capsule (4 mg total) by mouth daily., Disp: 30 capsule, Rfl: 6 .  traZODone (DESYREL) 100 MG tablet, TAKE 2 TABLETS (200 MG TOTAL) BY MOUTH AT BEDTIME., Disp: 180 tablet, Rfl: 1 .  TRUEplus Lancets 33G MISC, , Disp: , Rfl:   EXAM:  GENERAL: alert. Sounds to be in no acute distress.  Answering questions appropriately.   PSYCH/NEURO: pleasant and cooperative, no obvious depression or anxiety, speech and thought processing grossly intact  ASSESSMENT AND PLAN:  Discussed the following assessment and plan:  Problem List Items Addressed This Visit    Tobacco use disorder    Have discussed need to quit smoking.  She has declined to stop.  Follow.       Obstructive sleep apnea    CPAP      Mild depression (HCC)    Doing well on current regimen.  Taking trazodone, cymbalta.  Seeing psychiatry.        Iron deficiency anemia    Follow cbc and iron studies.       Relevant Orders   CBC with Differential/Platelet   IBC + Ferritin   ILD (interstitial lung disease) ([place])    Breathing doing well on symbicort.  Unable to afford advair.  Breathing better currently.  Discussed with her regarding CCM referral to see if can help with medication costs.  She is agreeable.  No increased cough or congestion.  Follow.       Hypercholesterolemia    On lipitor.  Low cholesterol diet and exercise.  Follow lipid panel and liver function tests.        Relevant Medications   telmisartan (MICARDIS) 80 MG tablet   propranolol (INDERAL) 40 MG tablet   Other Relevant Orders   Hepatic function panel   Lipid panel   Essential hypertension, benign    Blood pressure has been doing well.  Elevated now.  States staying 130-140/70-80 range.  (more 140 range).  Will increased micardis to 15m q day.  Follow pressures.  Follow metabolic panel.  Relevant Medications   telmisartan (MICARDIS) 80 MG tablet   propranolol  (INDERAL) 40 MG tablet   Other Relevant Orders   Basic metabolic panel   Dizziness    Evaluated by ENT.  Not an issue now.  Follow.       Diabetes ([place])    Low carb diet and exercise.  On metformin.  Follow met b and a1c.      Relevant Medications   telmisartan (MICARDIS) 80 MG tablet   metFORMIN (GLUCOPHAGE-XR) 500 MG 24 hr tablet   Other Relevant Orders   Hemoglobin A1c   COPD (chronic obstructive pulmonary disease) (HCC)    Doing well on symbicort.  Unable to afford advair.  Has rescue inhaler if needed.  Breathing doing well.  Follow.       CAD (coronary artery disease)    S/p stent placement.  No chest pain or increased sob.  Continue risk factor modification.  Continue lipitor.       Relevant Medications   telmisartan (MICARDIS) 80 MG tablet   propranolol (INDERAL) 40 MG tablet       I discussed the assessment and treatment plan with the patient. The patient was provided an opportunity to ask questions and all were answered. The patient agreed with the plan and demonstrated an understanding of the instructions.   The patient was advised to call back or seek an in-person evaluation if the symptoms worsen or if the condition fails to improve as anticipated.  I provided 22 minutes of non-face-to-face time during this encounter.   Einar Pheasant, MD

## 2020-09-16 ENCOUNTER — Encounter: Payer: Self-pay | Admitting: Internal Medicine

## 2020-09-16 NOTE — Assessment & Plan Note (Signed)
Evaluated by ENT.  Not an issue now.  Follow.

## 2020-09-16 NOTE — Assessment & Plan Note (Signed)
S/p stent placement.  No chest pain or increased sob.  Continue risk factor modification.  Continue lipitor.

## 2020-09-16 NOTE — Assessment & Plan Note (Signed)
Breathing doing well on symbicort.  Unable to afford advair.  Breathing better currently.  Discussed with her regarding CCM referral to see if can help with medication costs.  She is agreeable.  No increased cough or congestion.  Follow.

## 2020-09-16 NOTE — Assessment & Plan Note (Signed)
CPAP.  

## 2020-09-16 NOTE — Assessment & Plan Note (Signed)
Doing well on symbicort.  Unable to afford advair.  Has rescue inhaler if needed.  Breathing doing well.  Follow.

## 2020-09-16 NOTE — Assessment & Plan Note (Signed)
On lipitor.  Low cholesterol diet and exercise.  Follow lipid panel and liver function tests.   

## 2020-09-16 NOTE — Assessment & Plan Note (Signed)
Doing well on current regimen.  Taking trazodone, cymbalta.  Seeing psychiatry.

## 2020-09-16 NOTE — Assessment & Plan Note (Signed)
Blood pressure has been doing well.  Elevated now.  States staying 130-140/70-80 range.  (more 140 range).  Will increased micardis to 80mg  q day.  Follow pressures.  Follow metabolic panel.

## 2020-09-16 NOTE — Assessment & Plan Note (Signed)
Have discussed need to quit smoking.  She has declined to stop.  Follow.

## 2020-09-16 NOTE — Assessment & Plan Note (Signed)
Follow cbc and iron studies.  

## 2020-09-16 NOTE — Assessment & Plan Note (Signed)
Low carb diet and exercise.  On metformin.  Follow met b and a1c. 

## 2020-09-18 ENCOUNTER — Other Ambulatory Visit: Payer: Self-pay

## 2020-09-18 ENCOUNTER — Telehealth (INDEPENDENT_AMBULATORY_CARE_PROVIDER_SITE_OTHER): Payer: Medicare PPO | Admitting: Psychiatry

## 2020-09-18 DIAGNOSIS — Z5329 Procedure and treatment not carried out because of patient's decision for other reasons: Secondary | ICD-10-CM

## 2020-09-18 NOTE — Progress Notes (Signed)
No response to call or text or video invite  

## 2020-09-19 ENCOUNTER — Telehealth: Payer: Self-pay

## 2020-09-19 NOTE — Telephone Encounter (Signed)
Patient called regarding her appt yesterday and stated that she needs to talk with you at [phone]

## 2020-09-19 NOTE — Telephone Encounter (Signed)
Returned call to patient.  Patient reports that she just wanted to explain why she missed yesterday's appointment.  She reports she had forgotten that she will be getting a text message and also forgot all about MyChart video visit.  She reports finally when she found out she had a text message she tried to login however she kept getting an error message.  She however agrees to logging through her MyChart next time.  She has enough medication to last until her next appointment.

## 2020-09-21 ENCOUNTER — Telehealth: Payer: Self-pay | Admitting: Internal Medicine

## 2020-09-21 DIAGNOSIS — J449 Chronic obstructive pulmonary disease, unspecified: Secondary | ICD-10-CM

## 2020-09-21 NOTE — Telephone Encounter (Signed)
Pt called wanting to know if she was still going to see Catie

## 2020-09-21 NOTE — Telephone Encounter (Signed)
Pt following up on CCM referral

## 2020-09-22 NOTE — Telephone Encounter (Signed)
CCM referral placed.  Someone will be contacting her.

## 2020-09-22 NOTE — Addendum Note (Signed)
Addended by: Alisa Graff on: 09/22/2020 05:26 AM   Modules accepted: Orders

## 2020-09-22 NOTE — Telephone Encounter (Signed)
Patient informed and verbalized understanding

## 2020-09-26 ENCOUNTER — Telehealth: Payer: Self-pay

## 2020-09-26 NOTE — Chronic Care Management (AMB) (Signed)
  Chronic Care Management   Note  09/26/2020 Name: Kathryn Friedman MRN: [id number] DOB: 06/18/1956  Kathryn Friedman is a 64 y.o. year old female who is a primary care patient of Einar Pheasant, MD. I reached out to [name] by phone today in response to a referral sent by Ms. Dorena Bodo Friddle-Dwight's PCP, Dr. Nicki Reaper     [name] was given information about Chronic Care Management services today including:  1. CCM service includes personalized support from designated clinical staff supervised by her physician, including individualized plan of care and coordination with other care providers 2. 24/7 contact phone numbers for assistance for urgent and routine care needs. 3. Service will only be billed when office clinical staff spend 20 minutes or more in a month to coordinate care. 4. Only one practitioner may furnish and bill the service in a calendar month. 5. The patient may stop CCM services at any time (effective at the end of the month) by phone call to the office staff. 6. The patient will be responsible for cost sharing (co-pay) of up to 20% of the service fee (after annual deductible is met).  Patient agreed to services and verbal consent obtained.   Follow up plan: Telephone appointment with care management team member scheduled for:10/18/2020  Noreene Larsson, [place], [place], [place] [address] Direct Dial: [phone] [email or web address]: [email or web address]

## 2020-10-12 DIAGNOSIS — G4733 Obstructive sleep apnea (adult) (pediatric): Secondary | ICD-10-CM | POA: Diagnosis not present

## 2020-10-17 ENCOUNTER — Telehealth (INDEPENDENT_AMBULATORY_CARE_PROVIDER_SITE_OTHER): Payer: Medicare PPO | Admitting: Psychiatry

## 2020-10-17 ENCOUNTER — Other Ambulatory Visit: Payer: Self-pay

## 2020-10-17 ENCOUNTER — Encounter: Payer: Self-pay | Admitting: Psychiatry

## 2020-10-17 DIAGNOSIS — G4701 Insomnia due to medical condition: Secondary | ICD-10-CM

## 2020-10-17 DIAGNOSIS — F330 Major depressive disorder, recurrent, mild: Secondary | ICD-10-CM | POA: Diagnosis not present

## 2020-10-17 DIAGNOSIS — F419 Anxiety disorder, unspecified: Secondary | ICD-10-CM

## 2020-10-17 DIAGNOSIS — F17200 Nicotine dependence, unspecified, uncomplicated: Secondary | ICD-10-CM | POA: Diagnosis not present

## 2020-10-17 MED ORDER — MIRTAZAPINE 7.5 MG PO TABS: 7.5000 mg | ORAL_TABLET | Freq: Every day | ORAL | 0 refills | Status: DC

## 2020-10-17 MED ORDER — ESCITALOPRAM OXALATE 10 MG PO TABS: 10.0000 mg | ORAL_TABLET | Freq: Every day | ORAL | 0 refills | Status: DC

## 2020-10-17 MED ORDER — DULOXETINE HCL 60 MG PO CPEP: 60.0000 mg | ORAL_CAPSULE | Freq: Every day | ORAL | 1 refills | Status: DC
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
## 2020-10-18 ENCOUNTER — Ambulatory Visit: Payer: Medicare PPO | Admitting: Pharmacist

## 2020-10-18 ENCOUNTER — Telehealth: Payer: Self-pay | Admitting: Internal Medicine

## 2020-10-18 ENCOUNTER — Telehealth: Payer: Self-pay

## 2020-10-18 DIAGNOSIS — E7800 Pure hypercholesterolemia, unspecified: Secondary | ICD-10-CM

## 2020-10-18 DIAGNOSIS — K219 Gastro-esophageal reflux disease without esophagitis: Secondary | ICD-10-CM

## 2020-10-18 DIAGNOSIS — I10 Essential (primary) hypertension: Secondary | ICD-10-CM

## 2020-10-18 DIAGNOSIS — F17200 Nicotine dependence, unspecified, uncomplicated: Secondary | ICD-10-CM

## 2020-10-18 DIAGNOSIS — E1159 Type 2 diabetes mellitus with other circulatory complications: Secondary | ICD-10-CM

## 2020-10-18 DIAGNOSIS — J449 Chronic obstructive pulmonary disease, unspecified: Secondary | ICD-10-CM

## 2020-10-18 DIAGNOSIS — J849 Interstitial pulmonary disease, unspecified: Secondary | ICD-10-CM

## 2020-10-18 DIAGNOSIS — I2510 Atherosclerotic heart disease of native coronary artery without angina pectoris: Secondary | ICD-10-CM

## 2020-10-18 DIAGNOSIS — F330 Major depressive disorder, recurrent, mild: Secondary | ICD-10-CM

## 2020-10-18 MED ORDER — ALBUTEROL SULFATE HFA 108 (90 BASE) MCG/ACT IN AERS
INHALATION_SPRAY | RESPIRATORY_TRACT | 11 refills | Status: DC
Start: 2020-10-18 — End: 2020-10-25

## 2020-10-18 MED ORDER — AMLODIPINE BESYLATE 5 MG PO TABS
5.0000 mg | ORAL_TABLET | Freq: Every day | ORAL | 1 refills | Status: DC
Start: 2020-10-18 — End: 2020-10-25

## 2020-10-18 MED ORDER — ESCITALOPRAM OXALATE 10 MG PO TABS: 5.0000 mg | ORAL_TABLET | Freq: Every day | ORAL | 0 refills | Status: DC

## 2020-10-18 NOTE — Telephone Encounter (Signed)
Left message to return call to office.

## 2020-10-18 NOTE — Patient Instructions (Signed)
Kathryn Friedman,   It was great talking with you today!  We discussed:  - Reapplied for Symbicort assistance for 2022 from [employer]. Symbicort and Advair are duplicative therapy, so you do not need both. - Sent generic albuterol prescription to Kathryn Friedman to use with Good Rx card. Use this as needed for shortness of breath, coughing, wheeziness.  - Updated albuterol tablet strength to 5 mg so that you do not need to split the 10 mg tablet. Please make sure your [facility] providers update that on your medication list over there. Please monitor your blood pressure at home periodically.  - Only use the Flexeril as needed. That in combination with some of your other medications can increase your risk for dizziness, drowsiness, and sedation.    Call me with any questions or concerns!  Kathryn Friedman, PharmD [phone]   Visit Information Patient Care Plan: General Pharmacy (Adult)    Problem Identified: COPD, CAD, HLD, HTN     Long-Range Goal: Disease Progression Prevention   Note:   Current Barriers:   Unable to independently afford treatment regimen  Complex patient with multiple comorbidities including diabetes, coronary artery disease, COPD, depression/anxiety  Pharmacist Clinical Goal(s):   Over the next 90 days, patient will verbalize ability to afford treatment regimen.  Over the next 90 days, patient will adhere to prescribed medication regimen  Interventions:  Inter-disciplinary care team collaboration (see longitudinal plan of care)  Comprehensive medication review performed; medication list updated in electronic medical record  Diabetes:  Controlled; current treatment: metformin XR 1000 mg BID  Recommended to continue current regimen  Hypertension, Coronary Artery Disease  Appropriately managed; current treatment: amlodipine 5 mg daily (splitting 10 mg tabs, reports this is difficult to do), isosorbide mononitrate 30 mg QAM, propranolol 40 mg  BID, telmisartan 80 mg daily; follows w/ [facility]iah [facility]  Recommended to continue current treatment regimen.  Updated script for amlodipine 5 mg tabs sent to pharmacy for patient ease  Hyperlipidemia, ASCVD Risk Reduction  Controlled; current treatment: atorvastatin 20 mg daily  Antiplatelet therapy: clopidogrel 75 mg daily, aspirin 81 mg daily   Recommended to continue current treatment regimen  Chronic Obstructive Pulmonary Disease:  Uncontrolled; current treatment: Symbicort 160/4.5 mcg 2 puffs BID, though patient reports she was also previously taking Advair. Notes that she does not have albuterol rescue inhaler due to cost. Was receiving Symbicort and Advair through respective patient assistance programs in 2021. Notes that she reapplied for [place] for Advair, but wasn't aware of the out of pocket spend requirement resetting in 2022. She is unaware how to reapply for [place] assistance.  Follows w/ Dr. Raul Del.  Discussed patient assistance principals for [place] and [place]. Reviewed that Advair and Symbicort are duplicative therapy. Assisted patient in online reapplication request for Symbicort through [employer], reviewed that she will not be approved for Advair immediately anyway. Patient verbalized understanding.   Reviewed [employer] copay for generic albuterol HFA. Cost is $45. Reviewed GoodRx cost; only ~$20/1 inhaler at [facility]. Script sent to Kathryn Friedman per patient request. Reviewed importance of having albuerol HFA for PRN use.   Will evaluate appropriateness of triple therapy moving forward, based on reported use of rescue therapy  Depression/Anxiety/Insomnia:  Not well controlled, but with recent medication change. Follows w/ Dr. Shea Evans. Current regimen: escitalopram 10 mg daily (tapering); duloxetine 60 mg daily, mirtazapine 7.5 mg QPM started yesterday by Dr. Shea Evans to improve mood and sleep.   Recommended to continue current regimen  with collaboration with  psychiatry and therapy.   Diarrhea, chronic with GERD:  Improved per patient report; current regimen: colestipol 1 g daily, loperamide PRN. Pantoprazole 40 mg BID with famotidine 40 mg daily. Follows w/ gastroenterology  Recommended to continue current regimen with GI collaboration.   Overactive Bladder:  Well managed; current regimen: tolterodine LA 4 mg, follows w/ Dr. Bernardo Heater  Recommended to continue current regimen and collaboration w/ urology  Pain, Arthritis:  Patient reports 2 month history of sharp groin pain that radiates down L leg. Improved with heat. Notes that it impedes her ability to walk. Has not discussed with any providers or received work up. At baseline has been taking cyclobenzaprine 10 mg TID, did not understand PRN nature of this medication.   Discussed desired work up with PCP. Reviewed to only take cyclobenzaprine if needed, given other CNS sedating medicatinos. Patient verbalizes understanding.   Patient Goals/Self-Care Activities  Over the next 90 days, patient will:  - take medications as prescribed collaborate with provider on medication access solutions  Follow Up Plan: Telephone follow up appointment with care management team member scheduled for:~  8 weeks      Kathryn Friedman was given information about Chronic Care Management services today including:  1. CCM service includes personalized support from designated clinical staff supervised by her physician, including individualized plan of care and coordination with other care providers 2. 24/7 contact phone numbers for assistance for urgent and routine care needs. 3. Service will only be billed when office clinical staff spend 20 minutes or more in a month to coordinate care. 4. Only one practitioner may furnish and bill the service in a calendar month. 5. The patient may stop CCM services at any time (effective at the end of the month) by phone call to the office staff. 6. The patient will be  responsible for cost sharing (co-pay) of up to 20% of the service fee (after annual deductible is met).  Patient agreed to services and verbal consent obtained.   The patient verbalized understanding of instructions, educational materials, and care plan provided today and agreed to receive a mailed copy of patient instructions, educational materials, and care plan.    Plan: Telephone follow up appointment with care management team member scheduled for: ~ 8 weeks  Kathryn Friedman, PharmD, [place], [place] Pharmacist [place] [phone]

## 2020-10-18 NOTE — Telephone Encounter (Signed)
Discussed with patient that her Cymbalta, Lexapro and mirtazapine can have drug to drug interaction.  The plan is to wean her off of the Lexapro.  Discussed with her if she feels comfortable she can reduce Lexapro to 5 mg tomorrow, take it for a week and stop taking it.  She can start taking Remeron for sleep tonight since she is already on a lower dosage of Lexapro which is 10 mg right now.  Discussed drug to drug interaction, discontinuation syndrome.  She will monitor herself closely.

## 2020-10-18 NOTE — Telephone Encounter (Signed)
Second message left

## 2020-10-18 NOTE — Telephone Encounter (Signed)
Catie sent me this message - Talked to her today. She's endorsing a 2 month history of sharp groin pain that radiates down her L leg, hurts so much that she can't walk sometimes. Ranks it 8/10. Has been taking cyclobenzaprine TID anyways (didn't understand the PRN nature), and that isn't helping. Notes heating pad helps. Thoughts? Appt with you or NP, whoever can see?  if she is having 8/10 pain despite taking muscle relaxer tid, she needs to be see.  Would recommend [facility] - walk in.

## 2020-10-18 NOTE — Chronic Care Management (AMB) (Signed)
Chronic Care Management   Pharmacy Note  10/18/2020 Name: Kathryn Friedman MRN: [id number] DOB: [birth year]   Subjective:  Kathryn Friedman is a 64 y.o. year old female who is a primary care patient of Einar Pheasant, MD. The CCM team was consulted for assistance with chronic disease management and care coordination needs.    Engaged with patient by telephone for initial visit in response to provider referral for pharmacy case management and/or care coordination services.   Consent to Services:  Kathryn Friedman was given the following information about Chronic Care Management services today, agreed to services, and gave verbal consent: 1.CCM service includes personalized support from designated clinical staff supervised by her physician, including individualized plan of care and coordination with other care providers 2. 24/7 contact phone numbers for assistance for urgent and routine care needs. 3. Service will only be billed when office clinical staff spend 20 minutes or more in a month to coordinate care. 4. Only one practitioner may furnish and bill the service in a calendar month. 5.The patient may stop CCM services at any time (effective at the end of the month) by phone call to the office staff. 6. The patient will be responsible for cost sharing (co-pay) of up to 20% of the service fee (after annual deductible is met).  SDOH (Social Determinants of Health) assessments and interventions performed:  SDOH Interventions     Most Recent Value  SDOH Interventions  SDOH Interventions for the Following Domains Tobacco  Financial Strain Interventions Other (Comment)  [manufacturer assistance,  patient has also signed up for a different insurance plan for next year]  Tobacco Interventions Other (Comment)  [discussed need to quit. Will discuss cessation options and support moving forward.]       Objective:  Lab Results  Component Value Date   CREATININE 0.79 07/06/2020    CREATININE 0.71 03/17/2020   CREATININE 0.66 09/06/2019    Lab Results  Component Value Date   HGBA1C 6.2 07/06/2020       Component Value Date/Time   CHOL 118 07/06/2020 0824   TRIG 166.0 (H) 07/06/2020 0824   HDL 32.50 (L) 07/06/2020 0824   CHOLHDL 4 07/06/2020 0824   VLDL 33.2 07/06/2020 0824   LDLCALC 53 07/06/2020 0824   BP Readings from Last 3 Encounters:  09/14/20 (!) 156/89  07/20/20 128/74  07/13/20 116/62    Assessment/Interventions: Review of patient past medical history, allergies, medications, health status, including review of consultants reports, laboratory and other test data, was performed as part of comprehensive evaluation and provision of chronic care management services.   Allergies  Allergen Reactions  . Varenicline Other (See Comments)    "I got really depressed" "I got really depressed" CHANTEX  . Jardiance [Empagliflozin] Other (See Comments)    Yeast infection  . Methylprednisolone Nausea Only and Nausea And Vomiting    Medications Reviewed Today    Reviewed by De Hollingshead, RPH-CPP (Pharmacist) on 10/18/20 at [phone]  Med List Status: <None>  Medication Order Taking? Sig Documenting Provider Last Dose Status Informant  albuterol (VENTOLIN HFA) 108 (90 Base) MCG/ACT inhaler [id number] Yes INHALE 2 PUFFS FOUR TIMES A DAY Einar Pheasant, MD Taking Active   amLODipine (NORVASC) 10 MG tablet [id number] Yes TAKE 1 TABLET EVERY DAY  Patient taking differently: Take 5 mg by mouth daily.    Einar Pheasant, MD Taking Active   aspirin 81 MG tablet [id number] Yes Take 81 mg by mouth daily.  [provider] Taking Active Self  atorvastatin (LIPITOR) 20 MG tablet [id number] Yes TAKE 1 TABLET (20 MG TOTAL) BY MOUTH EVERY EVENING. Einar Pheasant, MD Taking Active   Blood Glucose Monitoring Suppl (TRUE METRIX METER) w/Device Drucie Opitz [id number] Yes  [provider] Taking Active   budesonide-formoterol (SYMBICORT) 160-4.5 MCG/ACT inhaler [id number]  Yes Inhale 2 puffs into the lungs in the morning and at bedtime.  [provider] Taking Active   CALCIUM PO [id number] Yes Take 600 mg by mouth daily.  [provider] Taking Active Self  clopidogrel (PLAVIX) 75 MG tablet [id number] Yes TAKE 1 TABLET (75 MG TOTAL) BY MOUTH DAILY. Einar Pheasant, MD Taking Active   colestipol (COLESTID) 1 g tablet [id number] Yes Take 1 g by mouth daily.  [provider] Taking Active   cyclobenzaprine (FLEXERIL) 10 MG tablet [id number] Yes Take 1 tablet (10 mg total) by mouth 3 (three) times daily as needed for muscle spasms. Einar Pheasant, MD Taking Active         Discontinued 10/18/20 [phone] (Completed Course)   DULoxetine (CYMBALTA) 60 MG capsule [id number] Yes Take 1 capsule (60 mg total) by mouth daily. Ursula Alert, MD Taking Active   escitalopram (LEXAPRO) 10 MG tablet [id number] Yes Take 1 tablet (10 mg total) by mouth daily. Ursula Alert, MD Taking Active   famotidine (PEPCID) 40 MG tablet [id number] Yes Take 1 tablet (40 mg total) by mouth daily. Einar Pheasant, MD Taking Active   glucose blood test strip [id number] Yes Use as instructed to check blood sugars twice daily. Dx E11.9. Einar Pheasant, MD Taking Active   isosorbide mononitrate (IMDUR) 30 MG 24 hr tablet [id number] Yes Take 1 tablet (30 mg total) by mouth daily. Einar Pheasant, MD Taking Active   Loperamide HCl (IMODIUM A-D PO) [id number] Yes Take 2 tablets by mouth daily as needed.  [provider] Taking Active Self  metFORMIN (GLUCOPHAGE-XR) 500 MG 24 hr tablet [id number] Yes Take 2 tablets (1,000 mg total) by mouth in the morning and at bedtime. Einar Pheasant, MD Taking Active   mirtazapine (REMERON) 7.5 MG tablet [id number] Yes Take 1 tablet (7.5 mg total) by mouth at bedtime. For sleep and mood Eappen, Ria Clock, MD Taking Active   Multiple Vitamin (MULTIVITAMIN PO) [id number] Yes Take 1 tablet by mouth daily. [provider] Taking Active Self    mupirocin ointment (BACTROBAN) 2 % [id number] No Apply to affected area on abdomen bid  Patient not taking: Reported on 10/18/2020   Einar Pheasant, MD Not Taking Active   nystatin (NYSTATIN) powder [id number] No APPLY TOPICALLY 2 TIMES A DAY  Patient not taking: Reported on 10/18/2020   Einar Pheasant, MD Not Taking Active   nystatin cream (MYCOSTATIN) [id number] No APPLY ONE APPLICATION TOPICALLY 2 TIMES A DAY  Patient not taking: Reported on 10/18/2020   Einar Pheasant, MD Not Taking Active Self  pantoprazole (PROTONIX) 40 MG tablet [id number] Yes Take 1 tablet (40 mg total) by mouth 2 (two) times daily before a meal. Einar Pheasant, MD Taking Active   propranolol (INDERAL) 40 MG tablet [id number] Yes 1 tab daily PO  Patient taking differently: Take 40 mg by mouth 2 (two) times daily. 1 tab daily PO   Einar Pheasant, MD Taking Active   telmisartan (MICARDIS) 80 MG tablet [id number] Yes Take 1 tablet (80 mg total) by mouth daily. Einar Pheasant, MD Taking Active   tolterodine (DETROL LA) 4 MG 24 hr capsule [id number] Yes Take 1 capsule (4 mg total) by mouth daily. Stoioff, Ronda Fairly, MD Taking  Active   TRUEplus Lancets 33G MISC [id number] Yes  [provider] Taking Active           Patient Active Problem List   Diagnosis Date Noted  . Abdominal wall abscess 07/30/2020  . Skin lesion 07/23/2020  . Boil 07/20/2020  . MDD (major depressive disorder), recurrent, in full remission ([place]) 06/26/2020  . Insomnia due to medical condition 06/26/2020  . Anxiety disorder 06/26/2020  . Cough 04/03/2020  . Urge incontinence 03/02/2020  . Urinary frequency 02/06/2020  . Bilateral carotid artery stenosis 10/15/2019  . PSVT (paroxysmal supraventricular tachycardia) ([place]) 08/09/2019  . History of migraine headaches 05/10/2019  . Lower abdominal pain 03/21/2019  . Dysphagia 09/15/2018  . ILD (interstitial lung disease) ([place]) 08/17/2017  . Tobacco use disorder 08/11/2016  . Venous  insufficiency of both lower extremities 07/10/2016  . Healthcare maintenance 06/18/2016  . Lump in the abdomen 08/06/2015  . Dizziness 08/06/2015  . Fatty infiltration of liver 08/11/2014  . Blood in the stool 08/11/2014  . Acute diarrhea 08/11/2014  . Hemorrhage of gastrointestinal tract 08/07/2014  . GERD (gastroesophageal reflux disease) 10/20/2013  . Iron deficiency anemia 10/20/2013  . Nausea with vomiting 10/19/2013  . LUQ pain 10/19/2013  . Diarrhea 09/26/2013  . Anemia 04/12/2013  . CAD (coronary artery disease) 02/18/2013  . COPD (chronic obstructive pulmonary disease) ([place]) 02/18/2013  . Obstructive sleep apnea 02/18/2013  . Mild depression ([place]) 02/18/2013  . Diverticulosis 02/18/2013  . Diabetes ([place]) 02/17/2013  . Essential hypertension, benign 02/17/2013  . Hypercholesterolemia 02/17/2013    Medication Assistance: Application for Symbicort medication assistance program in process. Anticipated assistance start date 11/2020. See plan of care below for additional detail.   Patient Care Plan: General Pharmacy (Adult)    Problem Identified: COPD, CAD, HLD, HTN     Long-Range Goal: Disease Progression Prevention   Note:   Current Barriers:  . Unable to independently afford treatment regimen . Complex patient with multiple comorbidities including diabetes, coronary artery disease, COPD, depression/anxiety  Pharmacist Clinical Goal(s):  Marland Kitchen Over the next 90 days, patient will verbalize ability to afford treatment regimen. . Over the next 90 days, patient will adhere to prescribed medication regimen  Interventions: . Inter-disciplinary care team collaboration (see longitudinal plan of care) . Comprehensive medication review performed; medication list updated in electronic medical record  Diabetes: . Controlled; current treatment: metformin XR 1000 mg BID . Recommended to continue current regimen  Hypertension, Coronary Artery Disease . Appropriately managed; current  treatment: amlodipine 5 mg daily (splitting 10 mg tabs, reports this is difficult to do), isosorbide mononitrate 30 mg QAM, propranolol 40 mg BID, telmisartan 80 mg daily; follows w/ [facility]. Nehemiah Massed at [facility] . Recommended to continue current treatment regimen.  Updated script for amlodipine 5 mg tabs sent to pharmacy for patient ease  Hyperlipidemia, ASCVD Risk Reduction . Controlled; current treatment: atorvastatin 20 mg daily . Antiplatelet therapy: clopidogrel 75 mg daily, aspirin 81 mg daily  . Recommended to continue current treatment regimen  Chronic Obstructive Pulmonary Disease: Marland Kitchen Uncontrolled; current treatment: Symbicort 160/4.5 mcg 2 puffs BID, though patient reports she was also previously taking Advair. Notes that she does not have albuterol rescue inhaler due to cost. Was receiving Symbicort and Advair through respective patient assistance programs in 2021. Notes that she reapplied for [place] for Advair, but wasn't aware of the out of pocket spend requirement resetting in 2022. She is unaware how to reapply for [place] assistance.  Follows w/ Dr. Raul Del. Marland Kitchen  Discussed patient assistance principals for [place]. Reviewed that Advair and Symbicort are duplicative therapy. Assisted patient in online reapplication request for Symbicort through [employer], reviewed that she will not be approved for Advair immediately anyway. Patient verbalized understanding.  . Reviewed [employer] copay for generic albuterol HFA. Cost is $45. Reviewed GoodRx cost; only ~$20/1 inhaler at [facility]. Script sent to [name] per patient request. Reviewed importance of having albuerol HFA for PRN use.  . Will evaluate appropriateness of triple therapy moving forward, based on reported use of rescue therapy  Depression/Anxiety/Insomnia: . Not well controlled, but with recent medication change. Follows w/ Dr. Shea Evans. Current regimen: escitalopram 10 mg daily (tapering); duloxetine 60 mg daily, mirtazapine 7.5  mg QPM started yesterday by Dr. Shea Evans to improve mood and sleep.  Marland Kitchen Recommended to continue current regimen with collaboration with psychiatry and therapy.   Diarrhea, chronic with GERD: . Improved per patient report; current regimen: colestipol 1 g daily, loperamide PRN. Pantoprazole 40 mg BID with famotidine 40 mg daily. Follows w/ gastroenterology . Recommended to continue current regimen with GI collaboration.   Overactive Bladder: . Well managed; current regimen: tolterodine LA 4 mg, follows w/ Dr. Bernardo Heater . Recommended to continue current regimen and collaboration w/ urology  Pain, Arthritis: . Patient reports 2 month history of sharp groin pain that radiates down L leg. Improved with heat. Notes that it impedes her ability to walk. Has not discussed with any providers or received work up. At baseline has been taking cyclobenzaprine 10 mg TID, did not understand PRN nature of this medication.  . Discussed desired work up with PCP. Reviewed to only take cyclobenzaprine if needed, given other CNS sedating medicatinos. Patient verbalizes understanding.   Patient Goals/Self-Care Activities . Over the next 90 days, patient will:  - take medications as prescribed collaborate with provider on medication access solutions  Follow Up Plan: Telephone follow up appointment with care management team member scheduled for:~  8 weeks       Plan: Telephone follow up appointment with care management team member scheduled for: ~ 8 weeks  [name], PharmD, [place], CPP Clinical Pharmacist [facility] [place] [phone]

## 2020-10-18 NOTE — Telephone Encounter (Signed)
pt called states she has some questions for you she states she confused about the medication changes. that the pharmacy states she cant take a combination of medication and so she really confused.

## 2020-10-18 NOTE — Telephone Encounter (Signed)
My chart message sent could not reach patient by phone.

## 2020-10-22 DIAGNOSIS — G4733 Obstructive sleep apnea (adult) (pediatric): Secondary | ICD-10-CM | POA: Diagnosis not present

## 2020-10-23 ENCOUNTER — Ambulatory Visit (INDEPENDENT_AMBULATORY_CARE_PROVIDER_SITE_OTHER): Payer: Medicare PPO | Admitting: Licensed Clinical Social Worker

## 2020-10-23 ENCOUNTER — Other Ambulatory Visit: Payer: Self-pay

## 2020-10-23 ENCOUNTER — Encounter: Payer: Self-pay | Admitting: Licensed Clinical Social Worker

## 2020-10-23 DIAGNOSIS — F330 Major depressive disorder, recurrent, mild: Secondary | ICD-10-CM | POA: Diagnosis not present

## 2020-10-23 DIAGNOSIS — F4310 Post-traumatic stress disorder, unspecified: Secondary | ICD-10-CM | POA: Diagnosis not present

## 2020-10-23 DIAGNOSIS — F419 Anxiety disorder, unspecified: Secondary | ICD-10-CM
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
## 2020-10-24 ENCOUNTER — Telehealth: Payer: Self-pay

## 2020-10-24 NOTE — Telephone Encounter (Signed)
prior auth approved for escitalopram 10mg  good until 11-24-2021

## 2020-10-25 ENCOUNTER — Telehealth: Payer: Self-pay

## 2020-10-25 MED ORDER — ATORVASTATIN CALCIUM 20 MG PO TABS
20.0000 mg | ORAL_TABLET | Freq: Every evening | ORAL | 0 refills | Status: DC
Start: 2020-10-25 — End: 2021-01-22

## 2020-10-25 MED ORDER — ALBUTEROL SULFATE HFA 108 (90 BASE) MCG/ACT IN AERS: INHALATION_SPRAY | RESPIRATORY_TRACT | 11 refills | Status: DC

## 2020-10-25 MED ORDER — METFORMIN HCL ER 500 MG PO TB24
1000.0000 mg | ORAL_TABLET | Freq: Two times a day (BID) | ORAL | 1 refills | Status: DC
Start: 2020-10-25 — End: 2021-08-10

## 2020-10-25 MED ORDER — AMLODIPINE BESYLATE 5 MG PO TABS
5.0000 mg | ORAL_TABLET | Freq: Every day | ORAL | 1 refills | Status: DC
Start: 2020-10-25 — End: 2020-12-04

## 2020-10-25 MED ORDER — TELMISARTAN 80 MG PO TABS
80.0000 mg | ORAL_TABLET | Freq: Every day | ORAL | 1 refills | Status: DC
Start: 2020-10-25 — End: 2021-04-04

## 2020-10-25 NOTE — Telephone Encounter (Signed)
Pt needs atorvastatin and all future refills for albuterol inhaler sent to [facility]. She said they are trying to send [place] a request to refill all her medications also.

## 2020-10-30 ENCOUNTER — Telehealth: Payer: Self-pay

## 2020-10-30 DIAGNOSIS — G4701 Insomnia due to medical condition: Secondary | ICD-10-CM

## 2020-10-30 DIAGNOSIS — F4310 Post-traumatic stress disorder, unspecified: Secondary | ICD-10-CM

## 2020-10-30 MED ORDER — ZOLPIDEM TARTRATE 5 MG PO TABS: 5.0000 mg | ORAL_TABLET | Freq: Every evening | ORAL | 1 refills | Status: DC | PRN

## 2020-10-30 NOTE — Telephone Encounter (Signed)
pt called states she needs something else for sleep. ths still having issues

## 2020-10-30 NOTE — Telephone Encounter (Signed)
Returned call to patient.  She reports her restless leg symptoms are getting worse with the mirtazapine. Discontinue mirtazapine. Start Ambien 5 mg p.o. nightly Provided medication education, discussed the risk of sleep complex behaviors.  She reports she went back on the Lexapro 10 mg and she had some anger issues.  Unknown if the anger issues were due to her sleep issues. Discussed to reduce Lexapro to 5 mg since she is on Cymbalta.  She will stop it in 10 days from now.

## 2020-11-04 ENCOUNTER — Encounter: Payer: Self-pay | Admitting: Internal Medicine

## 2020-11-06 ENCOUNTER — Other Ambulatory Visit: Payer: Self-pay

## 2020-11-06 LAB — HM DIABETES EYE EXAM

## 2020-11-06 MED ORDER — FAMOTIDINE 40 MG PO TABS: 40.0000 mg | ORAL_TABLET | Freq: Every day | ORAL | 1 refills | Status: DC

## 2020-11-06 NOTE — Telephone Encounter (Signed)
Please notify pt that she needs to notify cardiology and pulmonary of the planned surgery.  She will need pulmonary and cardiology clearance prior to her surgery.  Also, per her 09/14/20 note, needed f/u appt in 3 months.  Thanks

## 2020-11-06 NOTE — Telephone Encounter (Signed)
Please let her know that she needs to contact pulmonary and cardiology and let them know that surgery is planned.  Will need clearance.  Per 09/14/20 note, needed 3 month f/u.  Thanks

## 2020-11-08 ENCOUNTER — Other Ambulatory Visit: Payer: Self-pay | Admitting: Internal Medicine

## 2020-11-11 DIAGNOSIS — G4733 Obstructive sleep apnea (adult) (pediatric): Secondary | ICD-10-CM | POA: Diagnosis not present

## 2020-11-16 ENCOUNTER — Encounter: Payer: Self-pay | Admitting: Psychiatry

## 2020-11-16 ENCOUNTER — Other Ambulatory Visit: Payer: Self-pay | Admitting: Internal Medicine

## 2020-11-16 ENCOUNTER — Telehealth (INDEPENDENT_AMBULATORY_CARE_PROVIDER_SITE_OTHER): Payer: Medicare PPO | Admitting: Psychiatry

## 2020-11-16 ENCOUNTER — Other Ambulatory Visit: Payer: Self-pay

## 2020-11-16 DIAGNOSIS — G4701 Insomnia due to medical condition: Secondary | ICD-10-CM | POA: Diagnosis not present

## 2020-11-16 DIAGNOSIS — F330 Major depressive disorder, recurrent, mild: Secondary | ICD-10-CM | POA: Diagnosis not present

## 2020-11-16 DIAGNOSIS — F17200 Nicotine dependence, unspecified, uncomplicated: Secondary | ICD-10-CM

## 2020-11-16 DIAGNOSIS — F419 Anxiety disorder, unspecified: Secondary | ICD-10-CM | POA: Diagnosis not present

## 2020-11-16 MED ORDER — LAMOTRIGINE 25 MG PO TABS: 25.0000 mg | ORAL_TABLET | Freq: Every day | ORAL | 1 refills | Status: DC

## 2020-11-16 NOTE — Patient Instructions (Signed)
Lamotrigine tablets What is this medicine? LAMOTRIGINE (la MOE tri jeen) is used to control seizures in adults and children with epilepsy and Lennox-Gastaut syndrome. It is also used in adults to treat bipolar disorder. This medicine may be used for other purposes; ask your health care provider or pharmacist if you have questions. COMMON BRAND NAME(S): Lamictal, Subvenite What should I tell my health care provider before I take this medicine? They need to know if you have any of these conditions:  aseptic meningitis during prior use of lamotrigine  depression  folate deficiency  kidney disease  liver disease  suicidal thoughts, plans, or attempt; a previous suicide attempt by you or a family member  an unusual or allergic reaction to lamotrigine or other seizure medications, other medicines, foods, dyes, or preservatives  pregnant or trying to get pregnant  breast-feeding How should I use this medicine? Take this medicine by mouth with a glass of water. Follow the directions on the prescription label. Do not chew these tablets. If this medicine upsets your stomach, take it with food or milk. Take your doses at regular intervals. Do not take your medicine more often than directed. A special MedGuide will be given to you by the pharmacist with each new prescription and refill. Be sure to read this information carefully each time. Talk to your pediatrician regarding the use of this medicine in children. While this drug may be prescribed for children as young as 2 years for selected conditions, precautions do apply. Overdosage: If you think you have taken too much of this medicine contact a poison control center or emergency room at once. NOTE: This medicine is only for you. Do not share this medicine with others. What if I miss a dose? If you miss a dose, take it as soon as you can. If it is almost time for your next dose, take only that dose. Do not take double or extra doses. What may  interact with this medicine?  atazanavir  carbamazepine  female hormones, including contraceptive or birth control pills  lopinavir  methotrexate  phenobarbital  phenytoin  primidone  pyrimethamine  rifampin  ritonavir  trimethoprim  valproic acid This list may not describe all possible interactions. Give your health care provider a list of all the medicines, herbs, non-prescription drugs, or dietary supplements you use. Also tell them if you smoke, drink alcohol, or use illegal drugs. Some items may interact with your medicine. What should I watch for while using this medicine? Visit your doctor or health care provider for regular checks on your progress. If you take this medicine for seizures, wear a Medic Alert bracelet or necklace. Carry an identification card with information about your condition, medicines, and doctor or health care provider. It is important to take this medicine exactly as directed. When first starting treatment, your dose will need to be adjusted slowly. It may take weeks or months before your dose is stable. You should contact your doctor or health care provider if your seizures get worse or if you have any new types of seizures. Do not stop taking this medicine unless instructed by your doctor or health care provider. Stopping your medicine suddenly can increase your seizures or their severity. This medicine may cause serious skin reactions. They can happen weeks to months after starting the medicine. Contact your health care provider right away if you notice fevers or flu-like symptoms with a rash. The rash may be red or purple and then turn into blisters or peeling   of the skin. Or, you might notice a red rash with swelling of the face, lips or lymph nodes in your neck or under your arms. You may get drowsy, dizzy, or have blurred vision. Do not drive, use machinery, or do anything that needs mental alertness until you know how this medicine affects you. To  reduce dizzy or fainting spells, do not sit or stand up quickly, especially if you are an older patient. Alcohol can increase drowsiness and dizziness. Avoid alcoholic drinks. If you are taking this medicine for bipolar disorder, it is important to report any changes in your mood to your doctor or health care provider. If your condition gets worse, you get mentally depressed, feel very hyperactive or manic, have difficulty sleeping, or have thoughts of hurting yourself or committing suicide, you need to get help from your health care provider right away. If you are a caregiver for someone taking this medicine for bipolar disorder, you should also report these behavioral changes right away. The use of this medicine may increase the chance of suicidal thoughts or actions. Pay special attention to how you are responding while on this medicine. Your mouth may get dry. Chewing sugarless gum or sucking hard candy, and drinking plenty of water may help. Contact your doctor if the problem does not go away or is severe. Women who become pregnant while using this medicine may enroll in the [place] Antiepileptic Drug Pregnancy Registry by calling 1-888-233-2334. This registry collects information about the safety of antiepileptic drug use during pregnancy. This medicine may cause a decrease in folic acid. You should make sure that you get enough folic acid while you are taking this medicine. Discuss the foods you eat and the vitamins you take with your health care provider. What side effects may I notice from receiving this medicine? Side effects that you should report to your doctor or health care professional as soon as possible:  allergic reactions like skin rash, itching or hives, swelling of the face, lips, or tongue  changes in vision  depressed mood  elevated mood, decreased need for sleep, racing thoughts, impulsive behavior  loss of balance or coordination  mouth sores  rash, fever, and  swollen lymph nodes  redness, blistering, peeling or loosening of the skin, including inside the mouth  right upper belly pain  seizures  severe muscle pain  signs and symptoms of aseptic meningitis such as stiff neck and sensitivity to light, headache, drowsiness, fever, nausea, vomiting, rash  signs of infection - fever or chills, cough, sore throat, pain or difficulty passing urine  suicidal thoughts or other mood changes  swollen lymph nodes  trouble walking  unusual bruising or bleeding  unusually weak or tired  yellowing of the eyes or skin Side effects that usually do not require medical attention (report to your doctor or health care professional if they continue or are bothersome):  diarrhea  dizziness  dry mouth  stuffy nose  tiredness  tremors  trouble sleeping This list may not describe all possible side effects. Call your doctor for medical advice about side effects. You may report side effects to FDA at 1-800-FDA-1088. Where should I keep my medicine? Keep out of reach of children. Store at room temperature between 15 and 30 degrees C (59 and 86 degrees F). Throw away any unused medicine after the expiration date. NOTE: This sheet is a summary. It may not cover all possible information. If you have questions about this medicine, talk to your doctor,   pharmacist, or health care provider.  2020 Elsevier/Gold Standard (2019-02-12 15:03:40)  

## 2020-11-16 NOTE — Progress Notes (Signed)
Virtual Visit via Video Note  I connected with Kathryn Friedman on 11/16/20 at  9:30 AM EST by a video enabled telemedicine application and verified that I am speaking with the correct person using two identifiers.  Location Provider Location : [facility] Patient Location : Home  Participants: Patient , Provider   I discussed the limitations of evaluation and management by telemedicine and the availability of in person appointments. The patient expressed understanding and agreed to proceed.   I discussed the assessment and treatment plan with the patient. The patient was provided an opportunity to ask questions and all were answered. The patient agreed with the plan and demonstrated an understanding of the instructions.   The patient was advised to call back or seek an in-person evaluation if the symptoms worsen or if the condition fails to improve as anticipated.   [place] MD OP Progress Note  11/16/2020 9:55 AM Kathryn Friedman  MRN:  [id number]  Chief Complaint:  Chief Complaint    Follow-up     HPI: Kathryn Friedman is a 64 year old Caucasian female, currently married, retired, on [employer], lives in [place], has a history of MDD, anxiety, insomnia, obstructive sleep apnea on CPAP, coronary artery disease status post stent placement, COPD, diabetes melitis, hypertension, gastroesophageal reflux disease, arthritis, interstitial lung disease, hyperlipidemia, iron deficiency anemia, tobacco use disorder was evaluated by telemedicine today.  Patient today reports she is currently making progress with regards to her depressive symptoms.  She reports the holidays are always hard for her since it is the anniversary of her ex-husband's death, birthday, her mother's birthday and so on.  She however reports she has been coping okay.  She continues to struggle with anger and irritability often.  She hence is interested in a mood stabilizer.  She has never tried Lamictal before and is  willing to try it.  She is compliant on her Cymbalta.  She was able to taper herself off of the Lexapro.  She is doing well with that.  Patient reports sleep as good on the Ambien.  Denies side effects.  Patient continues to smoke cigarettes and reports she is not ready to quit at this time.  Patient denies any suicidality, homicidality or perceptual disturbances.  Patient was referred for CBT previously however today reports she is not interested in continuing therapy.  Patient denies any other concerns today.  Visit Diagnosis:    ICD-10-CM   1. MDD (major depressive disorder), recurrent episode, mild (HCC)  F33.0 lamoTRIgine (LAMICTAL) 25 MG tablet  2. Insomnia due to medical condition  G47.01    Sleep apnea , mental disorder  3. Anxiety disorder, unspecified type  F41.9   4. Tobacco use disorder  F17.200     Past Psychiatric History: I have reviewed past psychiatric history from my progress note on 06/26/2020.  Patient was under the care of [employer].  Past trials of medications like trazodone, Cymbalta, Lexapro, Rexulti  Past Medical History:  Past Medical History:  Diagnosis Date  . Anemia   . Arthritis    back and knees  . Asthma   . Blood in stool   . Chronic diarrhea   . COPD (chronic obstructive pulmonary disease) ([place])   . Coronary artery disease    s/p stent placement 06/25/06  . Depression    secondary to the death of her husband (died 33)  . Diabetes mellitus without complication ([place]) Diagnosed 05/2008  . Diverticulitis   . Diverticulosis   . Dizzinesses   .  Dysphagia   . Fatty infiltration of liver   . GERD (gastroesophageal reflux disease)   . Headache   . Hypertension   . Hypertriglyceridemia   . ILD (interstitial lung disease) ([place])   . Lower abdominal pain   . Lump in the abdomen   . PSVT (paroxysmal supraventricular tachycardia) ([place])   . Sleep apnea    on CPAP  . Spastic colon   . Tobacco abuse   . Venous insufficiency of both  lower extremities     Past Surgical History:  Procedure Laterality Date  . ABDOMINAL HYSTERECTOMY  with left ovary in place 1996  . APPENDECTOMY  1985  . BREAST BIOPSY Left 10/14/2017   calcs bx, fibrosis giant cell reaction and chronic inflammation, negative for malignancy.   Marland Kitchen CARDIAC CATHETERIZATION  2007   stents  . CESAREAN SECTION  1984  . CHOLECYSTECTOMY  1985  . COLONOSCOPY WITH PROPOFOL N/A 09/13/2016   Procedure: COLONOSCOPY WITH PROPOFOL;  Surgeon: Manya Silvas, MD;  Location[facility] ENDOSCOPY;  Service: Endoscopy;  Laterality: N/A;  . COLONOSCOPY WITH PROPOFOL N/A 11/09/2018   Procedure: COLONOSCOPY WITH PROPOFOL;  Surgeon: Manya Silvas, MD;  Location[facility] ENDOSCOPY;  Service: Endoscopy;  Laterality: N/A;  . COLONOSCOPY WITH PROPOFOL N/A 03/29/2020   Procedure: COLONOSCOPY WITH PROPOFOL;  Surgeon: Robert Bellow, MD;  Location: [facility] ENDOSCOPY;  Service: Endoscopy;  Laterality: N/A;  . ESOPHAGOGASTRODUODENOSCOPY (EGD) WITH PROPOFOL N/A 02/02/2018   Procedure: ESOPHAGOGASTRODUODENOSCOPY (EGD) WITH PROPOFOL;  Surgeon: Manya Silvas, MD;  Location: [facility] ENDOSCOPY;  Service: Endoscopy;  Laterality: N/A;  . ESOPHAGOGASTRODUODENOSCOPY (EGD) WITH PROPOFOL N/A 03/29/2020   Procedure: ESOPHAGOGASTRODUODENOSCOPY (EGD) WITH PROPOFOL;  Surgeon: Robert Bellow, MD;  Location: [facility] ENDOSCOPY;  Service: Endoscopy;  Laterality: N/A;  . JOINT REPLACEMENT     bilateral knee replacements  . KNEE ARTHROSCOPY  Arthroscopic left knee surgery   . KNEE SURGERY  status post knee surgey   . LEFT HEART CATH AND CORONARY ANGIOGRAPHY Left 05/14/2018   Procedure: LEFT HEART CATH AND CORONARY ANGIOGRAPHY;  Surgeon: Corey Skains, MD;  Location: [place] CV LAB;  Service: Cardiovascular;  Laterality: Left;  . REPLACEMENT TOTAL KNEE  ([facility])  . SHOULDER SURGERY  shoulder operation secondary to a torn tendon    Family Psychiatric History: I have reviewed family psychiatric history from  my progress note on 06/26/2020  Family History:  Family History  Problem Relation Age of Onset  . Other Mother        Hit by a fire truck and has had multiple operations on her back , and has history of MVP   . Mitral valve prolapse Mother   . Lung cancer Mother   . Depression Mother   . Heart disease Father        myocardial infarction and is status post bypass surgery  . Mitral valve prolapse Sister   . Bipolar disorder Sister   . Hepatitis C Brother   . Cirrhosis Brother   . Colon cancer Paternal Aunt   . Breast cancer Neg Hx     Social History: I have reviewed social history from my progress note on 06/26/2020 Social History   Socioeconomic History  . Marital status: Married    Spouse name: Not on file  . Number of children: 1  . Years of education: Not on file  . Highest education level: Not on file  Occupational History    Employer: [employer]  Tobacco Use  . Smoking status: Current  Every Day Smoker    Packs/day: 1.50    Years: 45.00    Pack years: 67.50    Types: Cigarettes  . Smokeless tobacco: Never Used  Vaping Use  . Vaping Use: Former  Substance and Sexual Activity  . Alcohol use: No    Alcohol/week: 0.0 standard drinks  . Drug use: No  . Sexual activity: Not on file  Other Topics Concern  . Not on file  Social History Narrative  . Not on file   Social Determinants of Health   Financial Resource Strain: Medium Risk  . Difficulty of Paying Living Expenses: Somewhat hard  Food Insecurity: Not on file  Transportation Needs: Not on file  Physical Activity: Not on file  Stress: Not on file  Social Connections: Not on file    Allergies:  Allergies  Allergen Reactions  . Varenicline Other (See Comments)    "I got really depressed" "I got really depressed" CHANTEX  . Jardiance [Empagliflozin] Other (See Comments)    Yeast infection  . Methylprednisolone Nausea Only and Nausea And Vomiting    Metabolic Disorder Labs: Lab Results  Component Value Date    HGBA1C 6.2 07/06/2020   No results found for: PROLACTIN Lab Results  Component Value Date   CHOL 118 07/06/2020   TRIG 166.0 (H) 07/06/2020   HDL 32.50 (L) 07/06/2020   CHOLHDL 4 07/06/2020   VLDL 33.2 07/06/2020   LDLCALC 53 07/06/2020   LDLCALC 47 03/17/2020   Lab Results  Component Value Date   TSH 1.56 03/17/2020   TSH 1.42 12/02/2017    Therapeutic Level Labs: No results found for: LITHIUM No results found for: VALPROATE No components found for:  CBMZ  Current Medications: Current Outpatient Medications  Medication Sig Dispense Refill  . albuterol (VENTOLIN HFA) 108 (90 Base) MCG/ACT inhaler INHALE 2 PUFFS FOUR TIMES A DAY 8.5 g 11  . amLODipine (NORVASC) 5 MG tablet Take 1 tablet (5 mg total) by mouth daily. 90 tablet 1  . aspirin 81 MG tablet Take 81 mg by mouth daily.     Marland Kitchen atorvastatin (LIPITOR) 20 MG tablet Take 1 tablet (20 mg total) by mouth every evening. 90 tablet 0  . Blood Glucose Monitoring Suppl (TRUE METRIX METER) w/Device KIT     . budesonide-formoterol (SYMBICORT) 160-4.5 MCG/ACT inhaler Inhale 2 puffs into the lungs in the morning and at bedtime.     Marland Kitchen CALCIUM PO Take 600 mg by mouth daily.     . clopidogrel (PLAVIX) 75 MG tablet TAKE 1 TABLET (75 MG TOTAL) BY MOUTH DAILY. 90 tablet 0  . colestipol (COLESTID) 1 g tablet Take 1 g by mouth daily.     . cyclobenzaprine (FLEXERIL) 10 MG tablet Take 1 tablet (10 mg total) by mouth 3 (three) times daily as needed for muscle spasms. 270 tablet 0  . DULoxetine (CYMBALTA) 60 MG capsule Take 1 capsule (60 mg total) by mouth daily. 90 capsule 1  . famotidine (PEPCID) 40 MG tablet TAKE 1 TABLET EVERY DAY 90 tablet 1  . glucose blood test strip Use as instructed to check blood sugars twice daily. Dx E11.9. 100 each 12  . isosorbide mononitrate (IMDUR) 30 MG 24 hr tablet Take 1 tablet (30 mg total) by mouth daily. 30 tablet 12  . lamoTRIgine (LAMICTAL) 25 MG tablet Take 1 tablet (25 mg total) by mouth daily. FOR  MOOD 30 tablet 1  . Loperamide HCl (IMODIUM A-D PO) Take 2 tablets by mouth daily as  needed.     . metFORMIN (GLUCOPHAGE-XR) 500 MG 24 hr tablet Take 2 tablets (1,000 mg total) by mouth in the morning and at bedtime. 360 tablet 1  . Multiple Vitamin (MULTIVITAMIN PO) Take 1 tablet by mouth daily.    . mupirocin ointment (BACTROBAN) 2 % Apply to affected area on abdomen bid (Patient not taking: Reported on 10/18/2020) 22 g 0  . nystatin (NYSTATIN) powder APPLY TOPICALLY 2 TIMES A DAY (Patient not taking: Reported on 10/18/2020) 15 g 0  . nystatin cream (MYCOSTATIN) APPLY ONE APPLICATION TOPICALLY 2 TIMES A DAY (Patient not taking: Reported on 10/18/2020) 30 g 0  . pantoprazole (PROTONIX) 40 MG tablet Take 1 tablet (40 mg total) by mouth 2 (two) times daily before a meal. 180 tablet 1  . propranolol (INDERAL) 40 MG tablet 1 tab daily PO (Patient taking differently: Take 40 mg by mouth 2 (two) times daily. 1 tab daily PO) 180 tablet 1  . telmisartan (MICARDIS) 80 MG tablet Take 1 tablet (80 mg total) by mouth daily. 90 tablet 1  . tolterodine (DETROL LA) 4 MG 24 hr capsule Take 1 capsule (4 mg total) by mouth daily. 30 capsule 6  . TRUEplus Lancets 33G MISC     . zolpidem (AMBIEN) 5 MG tablet Take 1 tablet (5 mg total) by mouth at bedtime as needed for sleep. 30 tablet 1   No current facility-administered medications for this visit.     Musculoskeletal: Strength & Muscle Tone: UTA Gait & Station: UTA Patient leans: N/A  Psychiatric Specialty Exam: Review of Systems  Psychiatric/Behavioral: Positive for dysphoric mood. The patient is nervous/anxious.   All other systems reviewed and are negative.   There were no vitals taken for this visit.There is no height or weight on file to calculate BMI.  General Appearance: Casual  Eye Contact:  Fair  Speech:  Clear and Coherent  Volume:  Normal  Mood:  Depressed and Irritable  Affect:  Congruent  Thought Process:  Goal Directed and Descriptions  of Associations: Intact  Orientation:  Full (Time, Place, and Person)  Thought Content: Logical   Suicidal Thoughts:  No  Homicidal Thoughts:  No  Memory:  Immediate;   Fair Recent;   Fair Remote;   Fair  Judgement:  Fair  Insight:  Fair  Psychomotor Activity:  Normal  Concentration:  Concentration: Fair and Attention Span: Fair  Recall:  [employer] of Knowledge: Fair  Language: Fair  Akathisia:  No  Handed:  Right  AIMS (if indicated): UTA  Assets:  Communication Skills Desire for Improvement Housing Social Support  ADL's:  Intact  Cognition: WNL  Sleep:  Fair   Screenings: Doctor, general practice Office Visit from 07/20/2020 in [place] Office Visit from 05/07/2019 in [place] Office Visit from 08/14/2017 in [place] Office Visit from 08/06/2016 in [place]  PHQ-2 Total Score 0 2 5 0  PHQ-9 Total Score $RemoveBef'3 2 19 'gPKbIaHhTz$ --       Assessment and Plan: Kathryn Friedman is a 64 year old Caucasian female who has a history of MDD, anxiety, sleep problems, multiple medical problems was evaluated by telemedicine today.  Patient is biologically predisposed given her family history of trauma, multiple medical problems.  Patient with psychosocial stressors of recent situational stressors, pandemic, relationship struggles with her son.  Patient continues to struggle with irritability, will benefit from the following medication changes.  Plan MDD-some progress Cymbalta  60 mg p.o. daily Discontinue Lexapro. Start Lamictal 25 mg p.o. daily Discussed medication benefits and risk factors, discussed Stevens-Johnson syndrome risk.   Anxiety disorder unspecified-unstable Patient was referred for CBT-patient however reports today she does not want to continues therapy at this time. We will monitor closely  Insomnia-improving Ambien 5 mg p.o. nightly  Tobacco use disorder-unstable Provided  counseling  Follow-up in clinic in 4 weeks or sooner if needed.  I have spent atleast 20 minutes face to face by video with patient today. More than 50 % of the time was spent for preparing to see the patient ( e.g., review of test, records ), ordering medications and test ,psychoeducation and supportive psychotherapy and care coordination,as well as documenting clinical information in electronic health record. This note was generated in part or whole with voice recognition software. Voice recognition is usually quite accurate but there are transcription errors that can and very often do occur. I apologize for any typographical errors that were not detected and corrected.        Ursula Alert, MD 11/16/2020, 9:55 AM

## 2020-11-20 DIAGNOSIS — H524 Presbyopia: Secondary | ICD-10-CM | POA: Diagnosis not present

## 2020-11-20 DIAGNOSIS — H2511 Age-related nuclear cataract, right eye: Secondary | ICD-10-CM | POA: Diagnosis not present

## 2020-11-20 DIAGNOSIS — H52213 Irregular astigmatism, bilateral: Secondary | ICD-10-CM | POA: Diagnosis not present

## 2020-11-20 DIAGNOSIS — H25013 Cortical age-related cataract, bilateral: Secondary | ICD-10-CM | POA: Diagnosis not present

## 2020-11-20 DIAGNOSIS — E119 Type 2 diabetes mellitus without complications: Secondary | ICD-10-CM | POA: Diagnosis not present

## 2020-11-20 DIAGNOSIS — H40033 Anatomical narrow angle, bilateral: Secondary | ICD-10-CM | POA: Diagnosis not present

## 2020-11-20 DIAGNOSIS — H2513 Age-related nuclear cataract, bilateral: Secondary | ICD-10-CM | POA: Diagnosis not present

## 2020-11-20 DIAGNOSIS — H35033 Hypertensive retinopathy, bilateral: Secondary | ICD-10-CM | POA: Diagnosis not present

## 2020-11-21 ENCOUNTER — Ambulatory Visit (INDEPENDENT_AMBULATORY_CARE_PROVIDER_SITE_OTHER): Payer: Medicare PPO | Admitting: Licensed Clinical Social Worker

## 2020-11-21 ENCOUNTER — Encounter: Payer: Self-pay | Admitting: Licensed Clinical Social Worker

## 2020-11-21 ENCOUNTER — Other Ambulatory Visit: Payer: Self-pay

## 2020-11-21 DIAGNOSIS — F4310 Post-traumatic stress disorder, unspecified: Secondary | ICD-10-CM

## 2020-11-21 DIAGNOSIS — F419 Anxiety disorder, unspecified: Secondary | ICD-10-CM | POA: Diagnosis not present

## 2020-11-21 DIAGNOSIS — F330 Major depressive disorder, recurrent, mild: Secondary | ICD-10-CM

## 2020-11-21 NOTE — Progress Notes (Signed)
Virtual Visit via Telephone Note  I connected with Kathryn Friedman on 11/21/20 at  9:00 AM EST by telephone and verified that I am speaking with the correct person using two identifiers.  Participating Parties Patient Provider  Location: Patient: Home Provider: Home Office   I discussed the limitations, risks, security and privacy concerns of performing an evaluation and management service by telephone and the availability of in person appointments. I also discussed with the patient that there may be a patient responsible charge related to this service. The patient expressed understanding and agreed to proceed.  THERAPY PROGRESS NOTE  Session Time: 47 Minutes  Participation Level: Active  Behavioral Response: Casual and Well GroomedAlertDepressed  Type of Therapy: Individual Therapy  Treatment Goals addressed: Coping  Interventions: CBT  Summary: Kathryn Friedman is a 64 y.o. female who presents with depression and anxiety. Pt described relationship with deceased first husband and the dreams she continues to have about his death. Pt reported she had difficulty coping with his loss over 22 years ago and was able to cope by focusing on caring for their son who was age 21 when his father died. Pt reported she had her son go to therapy and found it to be beneficial. Pt reported she has also been a lover of cats and dogs which have also helped her cope with loss. Pt described current marriage and issues she had in making comparisons coupled with feelings of guilt. Towards end of session, pt reported experiencing the following sxs for last 4 days: "feeling hot, sweating profusely, feels like my head is spinning, and feeling weak, like I am going to pass out, but I don't". Pt reported the head spinning and feeling like she is going to pass out has been ongoing for the past year and has had several tests done by PCP w/ no explanation of what may be causing these sxs. Pt reported  she was referred to a neurologist, however has not followed through. Pt reported she was thinking about going to urgent care, but is hesitant because of the long wait. Pt confirmed she had changed medications recently with psychiatrist.  Suicidal/Homicidal: No   Therapist Response: Therapist met with patient for follow up after completing CCA. Therapist and patient reviewed treatment plan and goals. Pt in agreement. Therapist and patient explored grief and loss issues and attempts to cope. Therapist provided psychoeducation around stages of grief and validated patient feelings and concerns. Therapist encouraged patient to reach out to medical provider regarding medical sxs she was reporting.  Plan: Return again in 1 month.  Diagnosis: Axis I: Anxiety Disorder NOS, Post Traumatic Stress Disorder and MDD, Recurrent, Mild    Axis II: N/A  Josephine Igo, LCSW, LCAS 11/21/2020

## 2020-11-23 ENCOUNTER — Other Ambulatory Visit: Payer: Self-pay | Admitting: Internal Medicine

## 2020-11-23 DIAGNOSIS — F330 Major depressive disorder, recurrent, mild: Secondary | ICD-10-CM

## 2020-11-27 ENCOUNTER — Telehealth: Payer: Self-pay | Admitting: Internal Medicine

## 2020-11-27 NOTE — Telephone Encounter (Signed)
Rx was sent in 12/1

## 2020-11-27 NOTE — Telephone Encounter (Signed)
Patient is requesting a refill on her amLODipine (NORVASC) 5 MG tablet. Please refill through her mail order pharmacy.

## 2020-11-29 ENCOUNTER — Other Ambulatory Visit: Payer: Self-pay | Admitting: Internal Medicine

## 2020-11-30 ENCOUNTER — Ambulatory Visit: Payer: Medicare HMO | Admitting: Pharmacist

## 2020-11-30 DIAGNOSIS — F17200 Nicotine dependence, unspecified, uncomplicated: Secondary | ICD-10-CM

## 2020-11-30 DIAGNOSIS — E1159 Type 2 diabetes mellitus with other circulatory complications: Secondary | ICD-10-CM

## 2020-11-30 DIAGNOSIS — I2510 Atherosclerotic heart disease of native coronary artery without angina pectoris: Secondary | ICD-10-CM

## 2020-11-30 DIAGNOSIS — E7800 Pure hypercholesterolemia, unspecified: Secondary | ICD-10-CM

## 2020-11-30 DIAGNOSIS — F320 Major depressive disorder, single episode, mild: Secondary | ICD-10-CM

## 2020-11-30 DIAGNOSIS — I10 Essential (primary) hypertension: Secondary | ICD-10-CM

## 2020-11-30 DIAGNOSIS — J449 Chronic obstructive pulmonary disease, unspecified: Secondary | ICD-10-CM

## 2020-11-30 DIAGNOSIS — F32A Depression, unspecified: Secondary | ICD-10-CM

## 2020-11-30 NOTE — Chronic Care Management (AMB) (Signed)
Chronic Care Management   Pharmacy Note  11/30/2020 Name: Kathryn Friedman MRN: [id number] DOB: 1956-09-01  Subjective:  Kathryn Friedman is a 65 y.o. year old female who is a primary care patient of Einar Pheasant, MD. The CCM team was consulted for assistance with chronic disease management and care coordination needs.    Engaged with patient by telephone for follow up visit in response to provider referral for pharmacy case management and/or care coordination services.   Consent to Services:  Patient was given information about Chronic Care Management services, agreed to services, and gave verbal consent prior to initiation of services on 10/18/20. Please see initial visit note for detailed documentation.   Objective:  Lab Results  Component Value Date   CREATININE 0.79 07/06/2020   CREATININE 0.71 03/17/2020   CREATININE 0.66 09/06/2019    Lab Results  Component Value Date   HGBA1C 6.2 07/06/2020       Component Value Date/Time   CHOL 118 07/06/2020 0824   TRIG 166.0 (H) 07/06/2020 0824   HDL 32.50 (L) 07/06/2020 0824   CHOLHDL 4 07/06/2020 0824   VLDL 33.2 07/06/2020 0824   LDLCALC 53 07/06/2020 0824    BP Readings from Last 3 Encounters:  09/14/20 (!) 156/89  07/20/20 128/74  07/13/20 116/62    Assessment/Interventions: Review of patient past medical history, allergies, medications, health status, including review of consultants reports, laboratory and other test data, was performed as part of comprehensive evaluation and provision of chronic care management services.   SDOH (Social Determinants of Health) assessments and interventions performed:  SDOH Interventions   Flowsheet Row Most Recent Value  SDOH Interventions   SDOH Interventions for the Following Domains Tobacco  Financial Strain Interventions Other (Comment)  [manufacturer assistance]  Tobacco Interventions Cessation Materials Given and Reviewed       CCM Care Plan  Allergies   Allergen Reactions  . Varenicline Other (See Comments)    "I got really depressed" "I got really depressed" CHANTEX  . Jardiance [Empagliflozin] Other (See Comments)    Yeast infection  . Methylprednisolone Nausea Only and Nausea And Vomiting    Medications Reviewed Today    Reviewed by De Hollingshead, RPH-CPP (Pharmacist) on 11/30/20 at [place] List Status: <None>  Medication Order Taking? Sig Documenting Provider Last Dose Status Informant  albuterol (VENTOLIN HFA) 108 (90 Base) MCG/ACT inhaler [id number] Yes INHALE 2 PUFFS FOUR TIMES A DAY Einar Pheasant, MD Taking Active   amLODipine (NORVASC) 5 MG tablet [id number] Yes Take 1 tablet (5 mg total) by mouth daily. Einar Pheasant, MD Taking Active   aspirin 81 MG tablet [id number] Yes Take 81 mg by mouth daily.  [provider] Taking Active Self  atorvastatin (LIPITOR) 20 MG tablet [id number] Yes Take 1 tablet (20 mg total) by mouth every evening. Einar Pheasant, MD Taking Active   Blood Glucose Monitoring Suppl (TRUE METRIX METER) w/Device Drucie Opitz [id number] Yes  [provider] Taking Active   budesonide-formoterol (SYMBICORT) 160-4.5 MCG/ACT inhaler [id number] Yes Inhale 2 puffs into the lungs in the morning and at bedtime.  [provider] Taking Active   CALCIUM PO [id number] Yes Take 600 mg by mouth daily.  [provider] Taking Active Self  clopidogrel (PLAVIX) 75 MG tablet [id number] Yes TAKE 1 TABLET EVERY DAY Einar Pheasant, MD Taking Active   colestipol (COLESTID) 1 g tablet [id number] Yes Take 1 g by mouth daily.  [provider] Taking Active   cyclobenzaprine (FLEXERIL) 10 MG  tablet [id number] Yes Take 1 tablet (10 mg total) by mouth 3 (three) times daily as needed for muscle spasms. Einar Pheasant, MD Taking Active   DULoxetine (CYMBALTA) 60 MG capsule [id number] Yes TAKE 1 CAPSULE (60 MG TOTAL) BY MOUTH DAILY. Einar Pheasant, MD Taking Active   famotidine (PEPCID) 40 MG tablet  [id number] Yes TAKE 1 TABLET EVERY DAY Einar Pheasant, MD Taking Active   glucose blood test strip [id number] Yes Use as instructed to check blood sugars twice daily. Dx E11.9. Einar Pheasant, MD Taking Active   isosorbide mononitrate (IMDUR) 30 MG 24 hr tablet [id number] Yes Take 1 tablet (30 mg total) by mouth daily. Einar Pheasant, MD Taking Active   lamoTRIgine (LAMICTAL) 25 MG tablet [id number] Yes Take 1 tablet (25 mg total) by mouth daily. FOR Cyndra Numbers, MD Taking Active   Loperamide HCl (IMODIUM A-D PO) [id number] Yes Take 2 tablets by mouth daily as needed.  [provider] Taking Active Self  metFORMIN (GLUCOPHAGE-XR) 500 MG 24 hr tablet [id number] Yes Take 2 tablets (1,000 mg total) by mouth in the morning and at bedtime. Einar Pheasant, MD Taking Active   Multiple Vitamin (MULTIVITAMIN PO) [id number] Yes Take 1 tablet by mouth daily. [provider] Taking Active Self  mupirocin ointment (BACTROBAN) 2 % [id number] Yes Apply to affected area on abdomen bid Einar Pheasant, MD Taking Active   nystatin (MYCOSTATIN/NYSTOP) powder [id number] No APPLY TO THE AFFECTED AREA(S) TWICE DAILY  Patient not taking: Reported on 11/30/2020   Einar Pheasant, MD Not Taking Active   nystatin cream (MYCOSTATIN) [id number] No APPLY ONE APPLICATION TOPICALLY 2 TIMES A DAY  Patient not taking: No sig reported   Einar Pheasant, MD Not Taking Active Self  pantoprazole (PROTONIX) 40 MG tablet [id number] Yes TAKE 1 TABLET TWICE DAILY BEFORE MEALS Einar Pheasant, MD Taking Active   propranolol (INDERAL) 40 MG tablet [id number] Yes 1 tab daily PO  Patient taking differently: Take 40 mg by mouth 2 (two) times daily. 1 tab daily PO   Einar Pheasant, MD Taking Active   telmisartan (MICARDIS) 80 MG tablet [id number] Yes Take 1 tablet (80 mg total) by mouth daily. Einar Pheasant, MD Taking Active   tolterodine (DETROL LA) 4 MG 24 hr capsule [id number] Yes Take 1 capsule (4 mg total) by mouth  daily. Abbie Sons, MD Taking Active   TRUEplus Lancets 33G [place] [id number] Yes  [provider] Taking Active   zolpidem (AMBIEN) 5 MG tablet [id number] Yes Take 1 tablet (5 mg total) by mouth at bedtime as needed for sleep. Ursula Alert, MD Taking Active           Patient Active Problem List   Diagnosis Date Noted  . MDD (major depressive disorder), recurrent episode, mild ([place]) 11/16/2020  . Abdominal wall abscess 07/30/2020  . Skin lesion 07/23/2020  . Boil 07/20/2020  . MDD (major depressive disorder), recurrent, in full remission ([place]) 06/26/2020  . Insomnia due to medical condition 06/26/2020  . PTSD (post-traumatic stress disorder) 06/26/2020  . Cough 04/03/2020  . Urge incontinence 03/02/2020  . Urinary frequency 02/06/2020  . Bilateral carotid artery stenosis 10/15/2019  . PSVT (paroxysmal supraventricular tachycardia) ([place]) 08/09/2019  . History of migraine headaches 05/10/2019  . Lower abdominal pain 03/21/2019  . Dysphagia 09/15/2018  . ILD (interstitial lung disease) ([place]) 08/17/2017  . Tobacco use disorder 08/11/2016  . Venous insufficiency of both lower extremities 07/10/2016  . Healthcare maintenance 06/18/2016  . Lump in the abdomen 08/06/2015  .  Dizziness 08/06/2015  . Fatty infiltration of liver 08/11/2014  . Blood in the stool 08/11/2014  . Acute diarrhea 08/11/2014  . Hemorrhage of gastrointestinal tract 08/07/2014  . GERD (gastroesophageal reflux disease) 10/20/2013  . Iron deficiency anemia 10/20/2013  . Nausea with vomiting 10/19/2013  . LUQ pain 10/19/2013  . Diarrhea 09/26/2013  . Anemia 04/12/2013  . CAD (coronary artery disease) 02/18/2013  . COPD (chronic obstructive pulmonary disease) ([place]) 02/18/2013  . Obstructive sleep apnea 02/18/2013  . Mild depression ([place]) 02/18/2013  . Diverticulosis 02/18/2013  . Diabetes ([place]) 02/17/2013  . Essential hypertension, benign 02/17/2013  . Hypercholesterolemia 02/17/2013     Conditions to be addressed/monitored: CAD, HTN, HLD, DM and tobacco use  Patient Care Plan: General Pharmacy (Adult)    Problem Identified: COPD, CAD, HLD, HTN     Long-Range Goal: Disease Progression Prevention   This Visit's Progress: On track  Priority: High  Note:   Current Barriers:  . Unable to independently afford treatment regimen . Complex patient with multiple comorbidities including diabetes, coronary artery disease, COPD, depression/anxiety  Pharmacist Clinical Goal(s):  Marland Kitchen Over the next 90 days, patient will verbalize ability to afford treatment regimen. . Over the next 90 days, patient will adhere to prescribed medication regimen  Interventions: . 1:1 collaboration with Einar Pheasant, MD regarding development and update of comprehensive plan of care as evidenced by provider attestation and co-signature . Inter-disciplinary care team collaboration (see longitudinal plan of care) . Comprehensive medication review performed; medication list updated in electronic medical record  Diabetes: . Controlled; current treatment: metformin XR 1000 mg BID . Patient asked why we weren't using 1000 mg tablets. Reviewed that XR 1000 mg tablets are generally much more expensive and not covered well.  . Recommended to continue current regimen  Hypertension, Coronary Artery Disease . Appropriately managed; current treatment: amlodipine 5 mg daily, isosorbide mononitrate 30 mg QAM, propranolol 40 mg BID (also for headaches), telmisartan 80 mg daily; follows w/ Dr. Nehemiah Massed at [facility] . Recommended to continue current treatment regimen.    Hyperlipidemia, ASCVD Risk Reduction . Controlled; current treatment: atorvastatin 20 mg daily . Antiplatelet therapy: clopidogrel 75 mg daily, aspirin 81 mg daily  . Recommended to continue current treatment regimen  Chronic Obstructive Pulmonary Disease: Marland Kitchen Uncontrolled; current treatment: Symbicort 160/4.5 mcg 2 puffs BID, albuterol  HFA PRN. Reports that she goes through 1 albuterol inhaler ~ monthly. Notes that the cost with [facility]Rx at [name] is ~$19 per inhaler, much better than $45/inhaler that she pays with her insurance  . Discussed that due to frequency of albuterol use, it may be prudent to discuss triple therapy with Dr. Raul Del at next appt. Also discussed impact that tobacco cessation would have on breathing and frequency of albuterol use. Patient would also be able to receive Breztri from [facility] patient assistance. Encouraged her to discuss w/ Dr. Raul Del at next appt.  . [place] require [place] for this patient. Will collaborate w/ CPhT to mail patient her portion of application, will collaborate w/ PCP for prescriber portion.   Depression/Anxiety/Insomnia/Pain: . Not well controlled, but with recent medication change. Follows w/ Dr. Shea Evans. Current regimen: lamotrigine 25 mg daily (just started); duloxetine 60 mg daily, zolpidem 5 mg QPM PRN, patient notes she is still requiring 1 trazodone tablet per night with the zolpidem to sleep; taking cyclobenzaprine 5 mg up to TID for pain/muscle spasms. Attempted taper, but notes resurgence of full body aches/pains . Encouraged to communicate w/  Dr. Shea Evans that she has also been taking trazodone. Encouraged continued collaboration w/ psychiatry and LCSW.   Diarrhea, chronic with GERD: . Improved per patient report; current regimen: colestipol 1 g daily, loperamide PRN. Pantoprazole 40 mg BID with famotidine 40 mg daily. Follows w/ gastroenterology . Recommended to continue current regimen with GI collaboration.   Overactive Bladder: . Well managed; current regimen: tolterodine LA 4 mg, follows w/ Dr. Bernardo Heater . Recommended to continue current regimen and collaboration w/ urology  Tobacco Abuse: . 1.5 packs per day; 50 years of use;  Marland Kitchen Previous quit attempts: unsuccessful using varenicline (night mares) . Triggers to smoke: stress- holidays were a  difficult time for the patient, multiple deaths in her family during the winter.  . Discussed benefit of pharmacotherapy in addition to behavioral changes. Discussed dual nicotine replacement therapy. Patient notes that cost has limited before. Reviewed her [employer] benefits on her app - it does appear that she can order patches and gum through [facility] OTC benefit. She will order nicotine 21 mg patches + nicotine 4 mg gum. She is not ready to quit yet, but will go ahead and obtain supplies. Prefers to discuss quit date at our next call  Patient Goals/Self-Care Activities . Over the next 90 days, patient will:  - take medications as prescribed collaborate with provider on medication access solutions  Follow Up Plan: Telephone follow up appointment with care management team member scheduled for:~  4 weeks      Medication Assistance: [place] for [place]Symb[place]ort[place] in process. Anticipated assistance start date TBD. See plan of care above for additional detail.   Plan: Telephone follow up appointment with care management team member scheduled for:~ 4 weeks  [name], PharmD, [place], [place] Clinical Pharmacist [employer] at [employer] [phone]

## 2020-11-30 NOTE — Patient Instructions (Signed)
Kathryn Friedman,   It was great talking with you today!  I recommend we get the nicotine 21 mg patches and nicotine 4 mg gum. Call me if you decide you want to start these therapies sooner than our next call and we can talk through the correct way to use the patches and gum.   Be on the look out for mail from my pharmacy technician next week with the pages of the [employer] application that I need you to fill out.   Let Dr. Meredeth Ide know that if he thinks you would benefit from escalating from Symbicort to [facility], we can help with the cost of that as well. He may not think you need the added steroid component of Breztri.   Call me with any questions or concerns in the meantime!  Catie Feliz Beam, PharmD [phone]   Visit Information  Patient Care Plan: General Pharmacy (Adult)    Problem Identified: COPD, CAD, HLD, HTN     Long-Range Goal: Disease Progression Prevention   This Visit's Progress: On track  Priority: High  Note:   Current Barriers:  . Unable to independently afford treatment regimen . Complex patient with multiple comorbidities including diabetes, coronary artery disease, COPD, depression/anxiety  Pharmacist Clinical Goal(s):  Marland Kitchen Over the next 90 days, patient will verbalize ability to afford treatment regimen. . Over the next 90 days, patient will adhere to prescribed medication regimen  Interventions: . 1:1 collaboration with Dale , MD regarding development and update of comprehensive plan of care as evidenced by provider attestation and co-signature . Inter-disciplinary care team collaboration (see longitudinal plan of care) . Comprehensive medication review performed; medication list updated in electronic medical record  Diabetes: . Controlled; current treatment: metformin XR 1000 mg BID . Patient asked why we weren't using 1000 mg tablets. Reviewed that XR 1000 mg tablets are generally much more expensive and not covered well.  . Recommended to continue  current regimen  Hypertension, Coronary Artery Disease . Appropriately managed; current treatment: amlodipine 5 mg daily, isosorbide mononitrate 30 mg QAM, propranolol 40 mg BID (also for headaches), telmisartan 80 mg daily; follows w/ Dr. Gwen Pounds at [facility] . Recommended to continue current treatment regimen.    Hyperlipidemia, ASCVD Risk Reduction . Controlled; current treatment: atorvastatin 20 mg daily . Antiplatelet therapy: clopidogrel 75 mg daily, aspirin 81 mg daily  . Recommended to continue current treatment regimen  Chronic Obstructive Pulmonary Disease: Marland Kitchen Uncontrolled; current treatment: Symbicort 160/4.5 mcg 2 puffs BID, albuterol HFA PRN. Reports that she goes through 1 albuterol inhaler ~ monthly. Notes that the cost with [facility]Rx at [facility] is ~$19 per inhaler, much better than $45/inhaler that she pays with her insurance  . Discussed that due to frequency of albuterol use, it may be prudent to discuss triple therapy with Dr. Meredeth Ide at next appt. Also discussed impact that tobacco cessation would have on breathing and frequency of albuterol use. Patient would also be able to receive Breztri from [facility] patient assistance. Encouraged her to discuss w/ Dr. Meredeth Ide at next appt.  . [place] for [place] patient. Will collaborate w/ CPhT to mail patient her portion of application, will collaborate w/ PCP for prescriber portion.   Depression/Anxiety/Insomnia/Pain: . Not well controlled, but with recent medication change. Follows w/ Dr. Elna Breslow. Current regimen: lamotrigine 25 mg daily (just started); duloxetine 60 mg daily, zolpidem 5 mg QPM PRN, patient notes she is still requiring 1 trazodone tablet per night with the zolpidem to  sleep; taking cyclobenzaprine 5 mg up to TID for pain/muscle spasms. Attempted taper, but notes resurgence of full body aches/pains . Encouraged to communicate w/ Dr. Shea Evans that she has also been taking trazodone.  Encouraged continued collaboration w/ psychiatry and LCSW.   Diarrhea, chronic with GERD: . Improved per patient report; current regimen: colestipol 1 g daily, loperamide PRN. Pantoprazole 40 mg BID with famotidine 40 mg daily. Follows w/ gastroenterology . Recommended to continue current regimen with GI collaboration.   Overactive Bladder: . Well managed; current regimen: tolterodine LA 4 mg, follows w/ Dr. Bernardo Heater . Recommended to continue current regimen and collaboration w/ urology  Tobacco Abuse: . 1.5 packs per day; 50 years of use;  Marland Kitchen Previous quit attempts: unsuccessful using varenicline (night mares) . Triggers to smoke: stress- holidays were a difficult time for the patient, multiple deaths in her family during the winter.  . Discussed benefit of pharmacotherapy in addition to behavioral changes. Discussed dual nicotine replacement therapy. Patient notes that cost has limited before. Reviewed her [employer] benefits on her app - it does appear that she can order patches and gum through [facility] OTC benefit. She will order nicotine 21 mg patches + nicotine 4 mg gum. She is not ready to quit yet, but will go ahead and obtain supplies. Prefers to discuss quit date at our next call  Patient Goals/Self-Care Activities . Over the next 90 days, patient will:  - take medications as prescribed collaborate with provider on medication access solutions  Follow Up Plan: Telephone follow up appointment with care management team member scheduled for:~  4 weeks       The patient verbalized understanding of instructions, educational materials, and care plan provided today and agreed to receive a mailed copy of patient instructions, educational materials, and care plan.   Plan: Telephone follow up appointment with care management team member scheduled for:~ 4 weeks  [name], PharmD, [place], [place] Clinical Pharmacist [employer] at [employer] [phone]

## 2020-12-01 ENCOUNTER — Telehealth: Payer: Medicare PPO

## 2020-12-04 ENCOUNTER — Telehealth: Payer: Medicare PPO

## 2020-12-04 ENCOUNTER — Telehealth: Payer: Self-pay | Admitting: Internal Medicine

## 2020-12-04 DIAGNOSIS — G4733 Obstructive sleep apnea (adult) (pediatric): Secondary | ICD-10-CM | POA: Diagnosis not present

## 2020-12-04 MED ORDER — AMLODIPINE BESYLATE 5 MG PO TABS: 5.0000 mg | ORAL_TABLET | Freq: Every day | ORAL | 1 refills | Status: DC

## 2020-12-04 NOTE — Telephone Encounter (Signed)
Pt called and said that [facility] mail order has not received this rx

## 2020-12-04 NOTE — Telephone Encounter (Signed)
Pt needs a rx sent to [facility] for Controlled Solution to clear her meter

## 2020-12-04 NOTE — Telephone Encounter (Signed)
RX has been resent to pharmacy 

## 2020-12-05 NOTE — Telephone Encounter (Signed)
After trying to find rx for controlled solution with Juliann Pulse it was determined patient will need to get the solution OTC. There was no solution found in system for this for her meter. Spoke with patient and she is aware she can get solution at [facility] or [facility] for her meter.

## 2020-12-06 ENCOUNTER — Telehealth: Payer: Self-pay | Admitting: Internal Medicine

## 2020-12-07 ENCOUNTER — Other Ambulatory Visit: Payer: Self-pay | Admitting: Internal Medicine

## 2020-12-08 ENCOUNTER — Telehealth: Payer: Self-pay | Admitting: Internal Medicine

## 2020-12-08 NOTE — Telephone Encounter (Signed)
Patient called in about a prior authorization needed for her refill for nystatin (MYCOSTATIN/NYSTOP) powder

## 2020-12-09 DIAGNOSIS — Z8616 Personal history of COVID-19: Secondary | ICD-10-CM

## 2020-12-09 HISTORY — DX: Personal history of COVID-19: Z86.16

## 2020-12-11 NOTE — Telephone Encounter (Signed)
Please confirm why pt needs.  See if has nystatin cream.  If not, can send in nystatin cream instead.

## 2020-12-11 NOTE — Telephone Encounter (Signed)
Is there any substitute for this medication? If not, will work on a PA.

## 2020-12-12 DIAGNOSIS — G4733 Obstructive sleep apnea (adult) (pediatric): Secondary | ICD-10-CM | POA: Diagnosis not present

## 2020-12-12 NOTE — Telephone Encounter (Signed)
Patient says that she is using the cream but the powder works better. She has some yeast underneath her abdominal fold on the left side/pelvic area. She has been using the powder and cream prn for awhile. Can try to submit PA if needed.

## 2020-12-12 NOTE — Telephone Encounter (Signed)
We may need to because I am not aware of a substitute.  Can see if pharmacy knows if there is a preferred.  Please let ms friddle know - working on [place]

## 2020-12-13 ENCOUNTER — Other Ambulatory Visit: Payer: Self-pay | Admitting: Internal Medicine

## 2020-12-19 ENCOUNTER — Other Ambulatory Visit: Payer: Self-pay

## 2020-12-19 ENCOUNTER — Encounter: Payer: Self-pay | Admitting: Internal Medicine

## 2020-12-19 ENCOUNTER — Telehealth (INDEPENDENT_AMBULATORY_CARE_PROVIDER_SITE_OTHER): Payer: Medicare HMO | Admitting: Internal Medicine

## 2020-12-19 ENCOUNTER — Telehealth (INDEPENDENT_AMBULATORY_CARE_PROVIDER_SITE_OTHER): Payer: Medicare HMO | Admitting: Psychiatry

## 2020-12-19 ENCOUNTER — Encounter: Payer: Self-pay | Admitting: Psychiatry

## 2020-12-19 DIAGNOSIS — Z20822 Contact with and (suspected) exposure to covid-19: Secondary | ICD-10-CM

## 2020-12-19 DIAGNOSIS — F419 Anxiety disorder, unspecified: Secondary | ICD-10-CM

## 2020-12-19 DIAGNOSIS — F330 Major depressive disorder, recurrent, mild: Secondary | ICD-10-CM | POA: Diagnosis not present

## 2020-12-19 DIAGNOSIS — F17200 Nicotine dependence, unspecified, uncomplicated: Secondary | ICD-10-CM

## 2020-12-19 DIAGNOSIS — G4701 Insomnia due to medical condition: Secondary | ICD-10-CM

## 2020-12-19 MED ORDER — LAMOTRIGINE 25 MG PO TABS: 25.0000 mg | ORAL_TABLET | Freq: Every day | ORAL | 0 refills | Status: DC

## 2020-12-19 MED ORDER — PREDNISONE 20 MG PO TABS: 20.0000 mg | ORAL_TABLET | Freq: Every day | ORAL | 0 refills | Status: DC

## 2020-12-19 MED ORDER — TRAZODONE HCL 100 MG PO TABS: 100.0000 mg | ORAL_TABLET | Freq: Every day | ORAL | 0 refills | Status: DC

## 2020-12-19 MED ORDER — LEVOFLOXACIN 500 MG PO TABS: 500.0000 mg | ORAL_TABLET | Freq: Every day | ORAL | 0 refills | Status: DC

## 2020-12-19 MED ORDER — CHERATUSSIN AC 100-10 MG/5ML PO SOLN: 5.0000 mL | Freq: Three times a day (TID) | ORAL | 0 refills | Status: DC | PRN

## 2020-12-19 NOTE — Patient Instructions (Addendum)
The office will call you to set up a covid test for tomorrow   Please try taking the prednisone 20 mg daily for 5 days,  Along with the antibiotic levaquin   You can use the cough medicine as needed ,  It has codeine in it so it may slow your bowels down a little  Please eat a serving of  Yogurt daily  for a minimum of 2 weeks to prevent a serious antibiotic associated diarrhea  Called clostridium dificile colitis.  Arlina Robes this is basically Taking a probiotic , which helps put back the right bacteria in your body and may also prevent vaginitis due to yeast infections and can be continued indefinitely if you feel that it improves your digestion or your elimination (bowels).

## 2020-12-19 NOTE — Progress Notes (Signed)
Virtual Visit via Video Note  I connected with Kathryn Friedman on 12/19/20 at  9:30 AM EST by a video enabled telemedicine application and verified that I am speaking with the correct person using two identifiers.  Location Provider Location : [facility] Patient Location : Home  Participants: Patient , Provider   I discussed the limitations of evaluation and management by telemedicine and the availability of in person appointments. The patient expressed understanding and agreed to proceed.   I discussed the assessment and treatment plan with the patient. The patient was provided an opportunity to ask questions and all were answered. The patient agreed with the plan and demonstrated an understanding of the instructions.   The patient was advised to call back or seek an in-person evaluation if the symptoms worsen or if the condition fails to improve as anticipated.   [place] MD OP Progress Note  12/19/2020 10:00 AM Kathryn Friedman  MRN:  [id number]  Chief Complaint:  Chief Complaint    Follow-up     HPI: Kathryn Friedman is a 65 year old Caucasian female, married, retired, on [employer], lives in [place], has a history of MDD, insomnia, anxiety disorder unspecified, tobacco use disorder was evaluated by telemedicine today.  Patient with multiple medical problems including obstructive sleep apnea on CPAP, coronary artery disease status post stent placement, COPD, diabetes melitis, hypertension, gastroesophageal reflux disease, arthritis, interstitial lung disease, hyperlipidemia, iron deficiency anemia, tobacco use disorder.  Patient today reports she is currently struggling with upper respiratory tract infection symptoms like congestion, headaches, fatigue, chest tightness as well as fever.  This has been going on since the past 1 week.  She has not gotten in touch with her primary care provider however agrees to do so.  Patient reports sleep affected.  She reports that  zolpidem stopped working.  She hence started combining zolpidem with  trazodone and that seems to help.  She however reports her current condition and upper respiratory tract infection symptoms does have an impact on her sleep and hence sleep as restless.  Patient denies any significant mood lability.  She is anxious about her health in general however reports she is coping okay.  The Lamictal does help.  She denies side effects.  Patient denies any suicidality, homicidality or perceptual disturbances.  She reports she is interested in cutting back on smoking cigarettes and was able to get nicotine patches and gums over-the-counter.  She is planning to start using them soon.  Patient denies any other concerns today.  Visit Diagnosis:    ICD-10-CM   1. MDD (major depressive disorder), recurrent episode, mild (HCC)  F33.0 lamoTRIgine (LAMICTAL) 25 MG tablet    traZODone (DESYREL) 100 MG tablet  2. Insomnia due to medical condition  G47.01    anxiety, URI, sleep apnea  3. Anxiety disorder, unspecified type  F41.9   4. Tobacco use disorder  F17.200     Past Psychiatric History: I have reviewed past psychiatric history from my progress note on 06/26/2020.  Past trials of trazodone, Cymbalta, Lexapro, Rexulti.  Patient was under the care of [employer].  Past Medical History:  Past Medical History:  Diagnosis Date  . Anemia   . Arthritis    back and knees  . Asthma   . Blood in stool   . Chronic diarrhea   . COPD (chronic obstructive pulmonary disease) ([place])   . Coronary artery disease    s/p stent placement 06/25/06  . Depression    secondary to  the death of her husband (died 54)  . Diabetes mellitus without complication ([place]) Diagnosed 05/2008  . Diverticulitis   . Diverticulosis   . Dizzinesses   . Dysphagia   . Fatty infiltration of liver   . GERD (gastroesophageal reflux disease)   . Headache   . Hypertension   . Hypertriglyceridemia   . ILD (interstitial lung  disease) ([place])   . Lower abdominal pain   . Lump in the abdomen   . PSVT (paroxysmal supraventricular tachycardia) ([place])   . Sleep apnea    on CPAP  . Spastic colon   . Tobacco abuse   . Venous insufficiency of both lower extremities     Past Surgical History:  Procedure Laterality Date  . ABDOMINAL HYSTERECTOMY  with left ovary in place 1996  . APPENDECTOMY  1985  . BREAST BIOPSY Left 10/14/2017   calcs bx, fibrosis giant cell reaction and chronic inflammation, negative for malignancy.   Marland Kitchen CARDIAC CATHETERIZATION  2007   stents  . CESAREAN SECTION  1984  . CHOLECYSTECTOMY  1985  . COLONOSCOPY WITH PROPOFOL N/A 09/13/2016   Procedure: COLONOSCOPY WITH PROPOFOL;  Surgeon: Manya Silvas, MD;  Location[facility] ENDOSCOPY;  Service: Endoscopy;  Laterality: N/A;  . COLONOSCOPY WITH PROPOFOL N/A 11/09/2018   Procedure: COLONOSCOPY WITH PROPOFOL;  Surgeon: Manya Silvas, MD;  Location[facility] ENDOSCOPY;  Service: Endoscopy;  Laterality: N/A;  . COLONOSCOPY WITH PROPOFOL N/A 03/29/2020   Procedure: COLONOSCOPY WITH PROPOFOL;  Surgeon: Robert Bellow, MD;  Location: [facility] ENDOSCOPY;  Service: Endoscopy;  Laterality: N/A;  . ESOPHAGOGASTRODUODENOSCOPY (EGD) WITH PROPOFOL N/A 02/02/2018   Procedure: ESOPHAGOGASTRODUODENOSCOPY (EGD) WITH PROPOFOL;  Surgeon: Manya Silvas, MD;  Location: [facility] ENDOSCOPY;  Service: Endoscopy;  Laterality: N/A;  . ESOPHAGOGASTRODUODENOSCOPY (EGD) WITH PROPOFOL N/A 03/29/2020   Procedure: ESOPHAGOGASTRODUODENOSCOPY (EGD) WITH PROPOFOL;  Surgeon: Robert Bellow, MD;  Location: [facility] ENDOSCOPY;  Service: Endoscopy;  Laterality: N/A;  . JOINT REPLACEMENT     bilateral knee replacements  . KNEE ARTHROSCOPY  Arthroscopic left knee surgery   . KNEE SURGERY  status post knee surgey   . LEFT HEART CATH AND CORONARY ANGIOGRAPHY Left 05/14/2018   Procedure: LEFT HEART CATH AND CORONARY ANGIOGRAPHY;  Surgeon: Corey Skains, MD;  Location: [facility] CV LAB;   Service: Cardiovascular;  Laterality: Left;  . REPLACEMENT TOTAL KNEE  ([facility])  . SHOULDER SURGERY  shoulder operation secondary to a torn tendon    Family Psychiatric History: I have reviewed family psychiatric history from my progress note on 06/26/2020  Family History:  Family History  Problem Relation Age of Onset  . Other Mother        Hit by a fire truck and has had multiple operations on her back , and has history of MVP   . Mitral valve prolapse Mother   . Lung cancer Mother   . Depression Mother   . Heart disease Father        myocardial infarction and is status post bypass surgery  . Mitral valve prolapse Sister   . Bipolar disorder Sister   . Hepatitis C Brother   . Cirrhosis Brother   . Colon cancer Paternal Aunt   . Breast cancer Neg Hx     Social History: Reviewed social history from my progress note on 06/26/2020 Social History   Socioeconomic History  . Marital status: Married    Spouse name: Not on file  . Number of children: 1  . Years of  education: Not on file  . Highest education level: Not on file  Occupational History    Employer: [employer]  Tobacco Use  . Smoking status: Current Every Day Smoker    Packs/day: 1.50    Years: 45.00    Pack years: 67.50    Types: Cigarettes  . Smokeless tobacco: Never Used  Vaping Use  . Vaping Use: Former  Substance and Sexual Activity  . Alcohol use: No    Alcohol/week: 0.0 standard drinks  . Drug use: No  . Sexual activity: Not on file  Other Topics Concern  . Not on file  Social History Narrative  . Not on file   Social Determinants of Health   Financial Resource Strain: Medium Risk  . Difficulty of Paying Living Expenses: Somewhat hard  Food Insecurity: Not on file  Transportation Needs: Not on file  Physical Activity: Not on file  Stress: Not on file  Social Connections: Not on file    Allergies:  Allergies  Allergen Reactions  . Varenicline Other (See Comments)    "I got really depressed" "I got  really depressed" CHANTEX  . Jardiance [Empagliflozin] Other (See Comments)    Yeast infection  . Methylprednisolone Nausea Only and Nausea And Vomiting    Metabolic Disorder Labs: Lab Results  Component Value Date   HGBA1C 6.2 07/06/2020   No results found for: PROLACTIN Lab Results  Component Value Date   CHOL 118 07/06/2020   TRIG 166.0 (H) 07/06/2020   HDL 32.50 (L) 07/06/2020   CHOLHDL 4 07/06/2020   VLDL 33.2 07/06/2020   LDLCALC 53 07/06/2020   LDLCALC 47 03/17/2020   Lab Results  Component Value Date   TSH 1.56 03/17/2020   TSH 1.42 12/02/2017    Therapeutic Level Labs: No results found for: LITHIUM No results found for: VALPROATE No components found for:  CBMZ  Current Medications: Current Outpatient Medications  Medication Sig Dispense Refill  . traZODone (DESYREL) 100 MG tablet Take 1-1.5 tablets (100-150 mg total) by mouth at bedtime. 135 tablet 0  . albuterol (VENTOLIN HFA) 108 (90 Base) MCG/ACT inhaler INHALE 2 PUFFS FOUR TIMES A DAY 8.5 g 11  . amLODipine (NORVASC) 5 MG tablet Take 1 tablet (5 mg total) by mouth daily. 90 tablet 1  . aspirin 81 MG tablet Take 81 mg by mouth daily.     Marland Kitchen atorvastatin (LIPITOR) 20 MG tablet Take 1 tablet (20 mg total) by mouth every evening. 90 tablet 0  . Blood Glucose Monitoring Suppl (TRUE METRIX METER) w/Device KIT     . brimonidine (ALPHAGAN) 0.2 % ophthalmic solution Place 1 drop into the right eye 3 (three) times daily. (Patient not taking: Reported on 12/19/2020)    . budesonide-formoterol (SYMBICORT) 160-4.5 MCG/ACT inhaler Inhale 2 puffs into the lungs in the morning and at bedtime.     Marland Kitchen CALCIUM PO Take 600 mg by mouth daily.     . clopidogrel (PLAVIX) 75 MG tablet TAKE 1 TABLET EVERY DAY 90 tablet 0  . cyclobenzaprine (FLEXERIL) 10 MG tablet TAKE 1 TABLET THREE TIMES DAILY AS NEEDED FOR MUSCLE SPASM(S) 270 tablet 1  . DULoxetine (CYMBALTA) 60 MG capsule TAKE 1 CAPSULE (60 MG TOTAL) BY MOUTH DAILY. 90 capsule 1   . famotidine (PEPCID) 40 MG tablet TAKE 1 TABLET EVERY DAY 90 tablet 1  . glucose blood test strip Use as instructed to check blood sugars twice daily. Dx E11.9. 100 each 12  . guaiFENesin-codeine (CHERATUSSIN AC) 100-10 MG/5ML syrup  Take 5 mLs by mouth 3 (three) times daily as needed for cough. 120 mL 0  . isosorbide mononitrate (IMDUR) 30 MG 24 hr tablet Take 1 tablet (30 mg total) by mouth daily. 30 tablet 12  . ketorolac (ACULAR) 0.5 % ophthalmic solution Place 1 drop into the right eye 4 (four) times daily. (Patient not taking: Reported on 12/19/2020)    . lamoTRIgine (LAMICTAL) 25 MG tablet Take 1 tablet (25 mg total) by mouth daily. FOR MOOD 90 tablet 0  . levofloxacin (LEVAQUIN) 500 MG tablet Take 1 tablet (500 mg total) by mouth daily. 7 tablet 0  . Loperamide HCl (IMODIUM A-D PO) Take 2 tablets by mouth daily as needed.     . metFORMIN (GLUCOPHAGE-XR) 500 MG 24 hr tablet Take 2 tablets (1,000 mg total) by mouth in the morning and at bedtime. 360 tablet 1  . Multiple Vitamin (MULTIVITAMIN PO) Take 1 tablet by mouth daily.    . mupirocin ointment (BACTROBAN) 2 % Apply to affected area on abdomen bid (Patient not taking: Reported on 12/19/2020) 22 g 0  . nystatin (MYCOSTATIN/NYSTOP) powder APPLY TO THE AFFECTED AREA(S) TWICE DAILY 15 g 0  . nystatin cream (MYCOSTATIN) APPLY ONE APPLICATION TOPICALLY 2 TIMES A DAY (Patient not taking: No sig reported) 30 g 0  . ofloxacin (OCUFLOX) 0.3 % ophthalmic solution 1 drop 4 (four) times daily. (Patient not taking: Reported on 12/19/2020)    . pantoprazole (PROTONIX) 40 MG tablet TAKE 1 TABLET TWICE DAILY BEFORE MEALS 180 tablet 1  . prednisoLONE acetate (PRED FORTE) 1 % ophthalmic suspension Place 1 drop into the right eye 4 (four) times daily. (Patient not taking: Reported on 12/19/2020)    . predniSONE (DELTASONE) 20 MG tablet Take 1 tablet (20 mg total) by mouth daily with breakfast. 6 tablets on Day 1 , then reduce by 1 tablet daily until gone 5  tablet 0  . propranolol (INDERAL) 40 MG tablet 1 tab daily PO (Patient taking differently: Take 40 mg by mouth 2 (two) times daily. 1 tab daily PO) 180 tablet 1  . telmisartan (MICARDIS) 80 MG tablet Take 1 tablet (80 mg total) by mouth daily. 90 tablet 1  . tolterodine (DETROL LA) 4 MG 24 hr capsule Take 1 capsule (4 mg total) by mouth daily. 30 capsule 6  . TRUEplus Lancets 33G MISC      No current facility-administered medications for this visit.     Musculoskeletal: Strength & Muscle Tone: UTA Gait & Station: UTA Patient leans: N/A  Psychiatric Specialty Exam: Review of Systems  Constitutional: Positive for appetite change, fatigue and fever.  HENT: Positive for congestion.   Respiratory: Positive for chest tightness.   Psychiatric/Behavioral: Positive for sleep disturbance. The patient is nervous/anxious.     There were no vitals taken for this visit.There is no height or weight on file to calculate BMI.  General Appearance: Casual  Eye Contact:  Fair  Speech:  Clear and Coherent  Volume:  Normal  Mood:  Anxious  Affect:  Congruent  Thought Process:  Goal Directed and Descriptions of Associations: Intact  Orientation:  Full (Time, Place, and Person)  Thought Content: Logical   Suicidal Thoughts:  No  Homicidal Thoughts:  No  Memory:  Immediate;   Fair Recent;   Fair Remote;   Fair  Judgement:  Fair  Insight:  Fair  Psychomotor Activity:  Normal  Concentration:  Concentration: Fair and Attention Span: Fair  Recall:  [employer] of Knowledge:  Fair  Language: Fair  Akathisia:  No  Handed:  Right  AIMS (if indicated): UTA  Assets:  Communication Skills Desire for Improvement Housing Intimacy Social Support  ADL's:  Intact  Cognition: WNL  Sleep:  Poor   Screenings: Doctor, general practice Office Visit from 07/20/2020 in [facility] Office Visit from 05/07/2019 in [facility] Office Visit from 08/14/2017 in [facility] Office Visit from 08/06/2016 in [facility]  PHQ-2 Total Score 0 2 5 0  PHQ-9 Total Score '3 2 19 ' --       Assessment and Plan: Kathryn Friedman is a 65 year old Caucasian female who has a history of MDD, anxiety, sleep problems, multiple medical problems was evaluated by telemedicine today.  Patient is biologically predisposed given her family history of trauma, multiple medical problems.  Patient with psychosocial stressors of the pandemic, recent upper respiratory tract infection symptoms, relationship struggles with her son.  Patient continues to struggle with sleep.  Plan as noted below.  Plan MDD-improving Cymbalta 60 mg p.o. daily Lamictal 25 mg p.o. daily   Anxiety disorder unspecified-unstable Patient to continue CBT. Continue Cymbalta.  Insomnia-unstable Discontinue Ambien for lack of benefit. Restart trazodone 100-150 mg p.o. nightly. Advised patient to add melatonin along with trazodone, start melatonin 5 mg and increase to 10 mg as needed. Discussed with patient not to change medications without consulting with her providers. Provided medication education.  Tobacco use disorder-unstable Provided counseling.  Patient provided information for [facility] health urgent care in [place].  Patient advised to go to the nearest urgent care or call her primary care provider for her upper respiratory tract infection symptoms.  Follow-up in clinic in 2 weeks or sooner if needed.  I have spent atleast 20 minutes face to face by video with patient today. More than 50 % of the time was spent for preparing to see the patient ( e.g., review of test, records ), obtaining and to review and separately obtained history , ordering medications and test ,psychoeducation and supportive psychotherapy and care coordination,as well as documenting clinical information in electronic health record. This note was generated in part or whole with voice recognition  software. Voice recognition is usually quite accurate but there are transcription errors that can and very often do occur. I apologize for any typographical errors that were not detected and corrected.      Ursula Alert, MD 12/20/2020, 8:15 AM

## 2020-12-19 NOTE — Progress Notes (Unsigned)
Virtual Visit converted to Telephone  Note  This visit type was conducted due to national recommendations for restrictions regarding the COVID-19 pandemic (e.g. social distancing).  This format is felt to be most appropriate for this patient at this time.  All issues noted in this document were discussed and addressed.  No physical exam was performed (except for noted visual exam findings with Video Visits).   I connected with@ on 12/19/20 at  4:00 PM EST by a video enabled telemedicine application or telephone and verified that I am speaking with the correct person using two identifiers. Location patient: home Location provider: work or home office Persons participating in the virtual visit: patient, provider  I discussed the limitations, risks, security and privacy concerns of performing an evaluation and management service by telephone and the availability of in person appointments. I also discussed with the patient that there may be a patient responsible charge related to this service. The patient expressed understanding and agreed to proceed.  Interactive audio and video telecommunications were attempted between this provider and patient, however failed, due to patient having technical difficulties  capability.  We continued and completed visit with audio only.   Reason for visit: s/s covid   HPI:  65 yr old female with COPD, CAD, type 2 DM   Presents with 10 days of chest tightness,  Cough,  Headache ,  low grade fevers,  Followed by sinus congestion,  Ears feeling full,  Feeling Weak and dizzy. Symptoms  have improved as of today .  Reports "low grade 'fevers of 100.1 , 99.  Did not get COVID TESTED BUT IS VACCINATED.  CHECKED PULSE OX AND IT WAS 95%.  Has felt very short of breath and light headed with moderate exertion (walking around the house)  COPD AND OSA:   USing albuterol every 4 to 6 hours during acute illness,  Using SYMBICORT as directed.  Marland Kitchen  USES [employer] to clean her CPAP .     She is intolerant of METHYLPREDNISOLONE  But willing to try a low dose of prednisone    ROS: See pertinent positives and negatives per HPI.  Past Medical History:  Diagnosis Date  . Anemia   . Arthritis    back and knees  . Asthma   . Blood in stool   . Chronic diarrhea   . COPD (chronic obstructive pulmonary disease) ([place])   . Coronary artery disease    s/p stent placement 06/25/06  . Depression    secondary to the death of her husband (died 16)  . Diabetes mellitus without complication ([place]) Diagnosed 05/2008  . Diverticulitis   . Diverticulosis   . Dizzinesses   . Dysphagia   . Fatty infiltration of liver   . GERD (gastroesophageal reflux disease)   . Headache   . Hypertension   . Hypertriglyceridemia   . ILD (interstitial lung disease) ([place])   . Lower abdominal pain   . Lump in the abdomen   . PSVT (paroxysmal supraventricular tachycardia) ([place])   . Sleep apnea    on CPAP  . Spastic colon   . Tobacco abuse   . Venous insufficiency of both lower extremities     Past Surgical History:  Procedure Laterality Date  . ABDOMINAL HYSTERECTOMY  with left ovary in place 1996  . APPENDECTOMY  1985  . BREAST BIOPSY Left 10/14/2017   calcs bx, fibrosis giant cell reaction and chronic inflammation, negative for malignancy.   Marland Kitchen CARDIAC CATHETERIZATION  2007  stents  . CESAREAN SECTION  1984  . CHOLECYSTECTOMY  1985  . COLONOSCOPY WITH PROPOFOL N/A 09/13/2016   Procedure: COLONOSCOPY WITH PROPOFOL;  Surgeon: Manya Silvas, MD;  Location[facility] ENDOSCOPY;  Service: Endoscopy;  Laterality: N/A;  . COLONOSCOPY WITH PROPOFOL N/A 11/09/2018   Procedure: COLONOSCOPY WITH PROPOFOL;  Surgeon: Manya Silvas, MD;  Location: [facility] ENDOSCOPY;  Service: Endoscopy;  Laterality: N/A;  . COLONOSCOPY WITH PROPOFOL N/A 03/29/2020   Procedure: COLONOSCOPY WITH PROPOFOL;  Surgeon: Robert Bellow, MD;  Location: [facility] ENDOSCOPY;  Service: Endoscopy;  Laterality: N/A;  .  ESOPHAGOGASTRODUODENOSCOPY (EGD) WITH PROPOFOL N/A 02/02/2018   Procedure: ESOPHAGOGASTRODUODENOSCOPY (EGD) WITH PROPOFOL;  Surgeon: Manya Silvas, MD;  Location: [facility] ENDOSCOPY;  Service: Endoscopy;  Laterality: N/A;  . ESOPHAGOGASTRODUODENOSCOPY (EGD) WITH PROPOFOL N/A 03/29/2020   Procedure: ESOPHAGOGASTRODUODENOSCOPY (EGD) WITH PROPOFOL;  Surgeon: Robert Bellow, MD;  Location: [facility] ENDOSCOPY;  Service: Endoscopy;  Laterality: N/A;  . JOINT REPLACEMENT     bilateral knee replacements  . KNEE ARTHROSCOPY  Arthroscopic left knee surgery   . KNEE SURGERY  status post knee surgey   . LEFT HEART CATH AND CORONARY ANGIOGRAPHY Left 05/14/2018   Procedure: LEFT HEART CATH AND CORONARY ANGIOGRAPHY;  Surgeon: Corey Skains, MD;  Location: [place] CV LAB;  Service: Cardiovascular;  Laterality: Left;  . REPLACEMENT TOTAL KNEE  ([facility])  . SHOULDER SURGERY  shoulder operation secondary to a torn tendon    Family History  Problem Relation Age of Onset  . Other Mother        Hit by a fire truck and has had multiple operations on her back , and has history of MVP   . Mitral valve prolapse Mother   . Lung cancer Mother   . Depression Mother   . Heart disease Father        myocardial infarction and is status post bypass surgery  . Mitral valve prolapse Sister   . Bipolar disorder Sister   . Hepatitis C Brother   . Cirrhosis Brother   . Colon cancer Paternal Aunt   . Breast cancer Neg Hx     SOCIAL HX:  reports that she has been smoking cigarettes. She has a 67.50 pack-year smoking history. She has never used smokeless tobacco. She reports that she does not drink alcohol and does not use drugs.   Current Outpatient Medications:  .  albuterol (VENTOLIN HFA) 108 (90 Base) MCG/ACT inhaler, INHALE 2 PUFFS FOUR TIMES A DAY, Disp: 8.5 g, Rfl: 11 .  amLODipine (NORVASC) 5 MG tablet, Take 1 tablet (5 mg total) by mouth daily., Disp: 90 tablet, Rfl: 1 .  aspirin 81 MG tablet, Take 81 mg by  mouth daily. , Disp: , Rfl:  .  atorvastatin (LIPITOR) 20 MG tablet, Take 1 tablet (20 mg total) by mouth every evening., Disp: 90 tablet, Rfl: 0 .  Blood Glucose Monitoring Suppl (TRUE METRIX METER) w/Device KIT, , Disp: , Rfl:  .  budesonide-formoterol (SYMBICORT) 160-4.5 MCG/ACT inhaler, Inhale 2 puffs into the lungs in the morning and at bedtime. , Disp: , Rfl:  .  CALCIUM PO, Take 600 mg by mouth daily. , Disp: , Rfl:  .  clopidogrel (PLAVIX) 75 MG tablet, TAKE 1 TABLET EVERY DAY, Disp: 90 tablet, Rfl: 0 .  cyclobenzaprine (FLEXERIL) 10 MG tablet, TAKE 1 TABLET THREE TIMES DAILY AS NEEDED FOR MUSCLE SPASM(S), Disp: 270 tablet, Rfl: 1 .  DULoxetine (CYMBALTA) 60 MG capsule, TAKE 1 CAPSULE (  60 MG TOTAL) BY MOUTH DAILY., Disp: 90 capsule, Rfl: 1 .  famotidine (PEPCID) 40 MG tablet, TAKE 1 TABLET EVERY DAY, Disp: 90 tablet, Rfl: 1 .  glucose blood test strip, Use as instructed to check blood sugars twice daily. Dx E11.9., Disp: 100 each, Rfl: 12 .  guaiFENesin-codeine (CHERATUSSIN AC) 100-10 MG/5ML syrup, Take 5 mLs by mouth 3 (three) times daily as needed for cough., Disp: 120 mL, Rfl: 0 .  isosorbide mononitrate (IMDUR) 30 MG 24 hr tablet, Take 1 tablet (30 mg total) by mouth daily., Disp: 30 tablet, Rfl: 12 .  lamoTRIgine (LAMICTAL) 25 MG tablet, Take 1 tablet (25 mg total) by mouth daily. FOR MOOD, Disp: 90 tablet, Rfl: 0 .  levofloxacin (LEVAQUIN) 500 MG tablet, Take 1 tablet (500 mg total) by mouth daily., Disp: 7 tablet, Rfl: 0 .  Loperamide HCl (IMODIUM A-D PO), Take 2 tablets by mouth daily as needed. , Disp: , Rfl:  .  metFORMIN (GLUCOPHAGE-XR) 500 MG 24 hr tablet, Take 2 tablets (1,000 mg total) by mouth in the morning and at bedtime., Disp: 360 tablet, Rfl: 1 .  Multiple Vitamin (MULTIVITAMIN PO), Take 1 tablet by mouth daily., Disp: , Rfl:  .  nystatin (MYCOSTATIN/NYSTOP) powder, APPLY TO THE AFFECTED AREA(S) TWICE DAILY, Disp: 15 g, Rfl: 0 .  pantoprazole (PROTONIX) 40 MG tablet,  TAKE 1 TABLET TWICE DAILY BEFORE MEALS, Disp: 180 tablet, Rfl: 1 .  predniSONE (DELTASONE) 20 MG tablet, Take 1 tablet (20 mg total) by mouth daily with breakfast. 6 tablets on Day 1 , then reduce by 1 tablet daily until gone, Disp: 5 tablet, Rfl: 0 .  propranolol (INDERAL) 40 MG tablet, 1 tab daily PO (Patient taking differently: Take 40 mg by mouth 2 (two) times daily. 1 tab daily PO), Disp: 180 tablet, Rfl: 1 .  telmisartan (MICARDIS) 80 MG tablet, Take 1 tablet (80 mg total) by mouth daily., Disp: 90 tablet, Rfl: 1 .  tolterodine (DETROL LA) 4 MG 24 hr capsule, Take 1 capsule (4 mg total) by mouth daily., Disp: 30 capsule, Rfl: 6 .  traZODone (DESYREL) 100 MG tablet, Take 1-1.5 tablets (100-150 mg total) by mouth at bedtime., Disp: 135 tablet, Rfl: 0 .  TRUEplus Lancets 33G MISC, , Disp: , Rfl:  .  brimonidine (ALPHAGAN) 0.2 % ophthalmic solution, Place 1 drop into the right eye 3 (three) times daily. (Patient not taking: Reported on 12/19/2020), Disp: , Rfl:  .  ketorolac (ACULAR) 0.5 % ophthalmic solution, Place 1 drop into the right eye 4 (four) times daily. (Patient not taking: Reported on 12/19/2020), Disp: , Rfl:  .  mupirocin ointment (BACTROBAN) 2 %, Apply to affected area on abdomen bid (Patient not taking: Reported on 12/19/2020), Disp: 22 g, Rfl: 0 .  nystatin cream (MYCOSTATIN), APPLY ONE APPLICATION TOPICALLY 2 TIMES A DAY (Patient not taking: No sig reported), Disp: 30 g, Rfl: 0 .  ofloxacin (OCUFLOX) 0.3 % ophthalmic solution, 1 drop 4 (four) times daily. (Patient not taking: Reported on 12/19/2020), Disp: , Rfl:  .  prednisoLONE acetate (PRED FORTE) 1 % ophthalmic suspension, Place 1 drop into the right eye 4 (four) times daily. (Patient not taking: Reported on 12/19/2020), Disp: , Rfl:   EXAM:  VITALS per patient if applicable:  GENERAL: alert, oriented, appears well and in no acute distress  HEENT: atraumatic, conjunttiva clear, no obvious abnormalities on inspection of external  nose and ears  NECK: normal movements of the head and neck  LUNGS:  on inspection no signs of respiratory distress, breathing rate appears normal, no obvious gross SOB, gasping or wheezing  CV: no obvious cyanosis  MS: moves all visible extremities without noticeable abnormality  PSYCH/NEURO: pleasant and cooperative, no obvious depression or anxiety, speech and thought processing grossly intact  ASSESSMENT AND PLAN:  Discussed the following assessment and plan:  Suspected COVID-19 virus infection  Suspected COVID-19 virus infection Prescribing low dose prednisone , cheratussin and levaquin given histor y of COPD.  Testing for influenza and COVID 19 ORDERED    I discussed the assessment and treatment plan with the patient. The patient was provided an opportunity to ask questions and all were answered. The patient agreed with the plan and demonstrated an understanding of the instructions.   The patient was advised to call back or seek an in-person evaluation if the symptoms worsen or if the condition fails to improve as anticipated.  Video connection was lost at < 50% of the duration of the visit ,  At which time the remainder of the visit was completed via audio only.   I provided  25 minutes of non-face-to-face time during this encounter reviewing patient's current problems and post surgeries.  Providing counseling on the above mentioned problems , and coordination  of care .  Crecencio Mc, MD

## 2020-12-20 ENCOUNTER — Telehealth: Payer: Self-pay

## 2020-12-20 ENCOUNTER — Other Ambulatory Visit (INDEPENDENT_AMBULATORY_CARE_PROVIDER_SITE_OTHER): Payer: Medicare HMO

## 2020-12-20 DIAGNOSIS — R059 Cough, unspecified: Secondary | ICD-10-CM | POA: Diagnosis not present

## 2020-12-20 DIAGNOSIS — Z20822 Contact with and (suspected) exposure to covid-19: Secondary | ICD-10-CM | POA: Insufficient documentation

## 2020-12-20 LAB — POCT INFLUENZA A/B
Influenza A, POC: NEGATIVE
Influenza B, POC: NEGATIVE

## 2020-12-20 NOTE — Telephone Encounter (Signed)
Pt was able to pick up nystatin

## 2020-12-20 NOTE — Telephone Encounter (Signed)
LMTCB

## 2020-12-20 NOTE — Assessment & Plan Note (Signed)
Prescribing low dose prednisone , cheratussin and levaquin given histor y of COPD.  Testing for influenza and COVID 19 ORDERED

## 2020-12-20 NOTE — Telephone Encounter (Signed)
Orders placed for lab appt this afternoon.

## 2020-12-22 ENCOUNTER — Other Ambulatory Visit: Payer: Self-pay

## 2020-12-22 ENCOUNTER — Encounter: Payer: Self-pay | Admitting: Licensed Clinical Social Worker

## 2020-12-22 ENCOUNTER — Ambulatory Visit (INDEPENDENT_AMBULATORY_CARE_PROVIDER_SITE_OTHER): Payer: Medicare HMO | Admitting: Licensed Clinical Social Worker

## 2020-12-22 DIAGNOSIS — G4701 Insomnia due to medical condition: Secondary | ICD-10-CM | POA: Diagnosis not present

## 2020-12-22 DIAGNOSIS — F330 Major depressive disorder, recurrent, mild: Secondary | ICD-10-CM

## 2020-12-22 DIAGNOSIS — F419 Anxiety disorder, unspecified: Secondary | ICD-10-CM | POA: Diagnosis not present

## 2020-12-22 DIAGNOSIS — F4310 Post-traumatic stress disorder, unspecified: Secondary | ICD-10-CM | POA: Diagnosis not present

## 2020-12-22 NOTE — Progress Notes (Signed)
Virtual Visit via Video Note  I connected with Kathryn Friedman on 12/22/20 at  9:00 AM EST by a video enabled telemedicine application and verified that I am speaking with the correct person using two identifiers.  Participating Parties Patient Provider  Location: Patient: Home Provider: Home Office   I discussed the limitations of evaluation and management by telemedicine and the availability of in person appointments. The patient expressed understanding and agreed to proceed.  THERAPY PROGRESS NOTE  Session Time: 30 Minutes  Participation Level: Active  Behavioral Response: CasualAlertEuthymic  Type of Therapy: Individual Therapy  Treatment Goals addressed: Coping  Interventions: CBT  Summary: Kathryn Friedman is a 65 y.o. female who presents with depression and anxiety sxs. Pt reported she is feeling "much better" and getting a lot of rest and sleep. Pt denied experiencing nightmares. Pt reported there are many nights she has trouble sleeping due to "can't shut my brain off", however believes her current medication mixed with cough medicine for her illness is helping with that lately. Pt reported family is close by and interacts with them regularly. Pt reported her brother-in-law has Alzheimer's and is supportive of her sister in "dealing with this". Pt reported she remembered her own mother going through dementia and had difficulty watching her health decline, but believes "she is in a better place now" since her death. Pt reported she spends most of her time watching TV and caring for her pets. Pt reflected on relationship with her adult son and memories of when he was little.   Suicidal/Homicidal: No  Therapist Response: Therapist met with patient for follow up. Therapist and patient reviewed sxs and attempts to cope. Therapist provided psychoeducation around using worry journal and sleep hygiene. Pt was receptive. Therapist and patient processed feelings and  thoughts related to past experiences.  Plan: Return again in 1 month.  Diagnosis: Axis I: Anxiety Disorder NOS, Post Traumatic Stress Disorder and MDD, Recurrent, Mild    Axis II: N/A  Josephine Igo, LCSW, LCAS 12/22/2020

## 2020-12-23 LAB — NOVEL CORONAVIRUS, NAA

## 2020-12-23 NOTE — Progress Notes (Signed)
Your  COVID TEST WAS POSITIVE .  THE MEDICATIONS I PRESCRIBED should help with the symptoms you were having.  Please continue to isolate for a total of 7 days from the day you developed symptoms . Please all the office if you are not improving by Monday

## 2020-12-25 ENCOUNTER — Telehealth: Payer: Self-pay

## 2020-12-25 NOTE — Telephone Encounter (Signed)
Left message to call back for lab results.

## 2020-12-25 NOTE — Telephone Encounter (Signed)
Patient returned office phone call for lab resutls. 

## 2020-12-26 ENCOUNTER — Telehealth (INDEPENDENT_AMBULATORY_CARE_PROVIDER_SITE_OTHER): Payer: Medicare HMO | Admitting: Internal Medicine

## 2020-12-26 DIAGNOSIS — B370 Candidal stomatitis: Secondary | ICD-10-CM | POA: Diagnosis not present

## 2020-12-26 DIAGNOSIS — Z20822 Contact with and (suspected) exposure to covid-19: Secondary | ICD-10-CM

## 2020-12-26 MED ORDER — NYSTATIN 100000 UNIT/ML MT SUSP: OROMUCOSAL | 0 refills | Status: DC

## 2020-12-26 NOTE — Progress Notes (Signed)
Patient ID: [name], female   DOB: November 14, 1956, 65 y.o.   MRN: [id number]   Virtual Visit via telephone Note  This visit type was conducted due to national recommendations for restrictions regarding the COVID-19 pandemic (e.g. social distancing).  This format is felt to be most appropriate for this patient at this time.  All issues noted in this document were discussed and addressed.  No physical exam was performed (except for noted visual exam findings with Video Visits).   I connected with Kathryn Friedman today by telephone and verified that I am speaking with the correct person using two identifiers. Location patient: home Location provider: work Persons participating in the telephone visit: patient, provider  The limitations, risks, security and privacy concerns of performing an evaluation and management service by telephone and the availability of in person appointments have been discussed.  It has also been discussed with the patient that there may be a patient responsible charge related to this service. The patient expressed understanding and agreed to proceed.   Reason for visit: work in appt  HPI: Work in appt - cough and congestion.  Was evaluated recently by Dr Derrel Nip 12/19/20. Per message, was informed covid positive.  Was treated with prednisone, levaquin and cheratussin.  She reports headache has resolved.  Still with cough, but is better.  Runny nose.  Left ear stopped up.  Some drainage.  Some chest congestion, wheezing and sob, but overall seems to be better.  Reports increased discomfort in her mouth - states her tongue burns - feels scorched.  Some loose stool the last two days.     ROS: See pertinent positives and negatives per HPI.  Past Medical History:  Diagnosis Date  . Anemia   . Arthritis    back and knees  . Asthma   . Blood in stool   . Chronic diarrhea   . COPD (chronic obstructive pulmonary disease) ([place])   . Coronary artery disease     s/p stent placement 06/25/06  . Depression    secondary to the death of her husband (died 34)  . Diabetes mellitus without complication ([place]) Diagnosed 05/2008  . Diverticulitis   . Diverticulosis   . Dizzinesses   . Dysphagia   . Fatty infiltration of liver   . GERD (gastroesophageal reflux disease)   . Headache   . Hypertension   . Hypertriglyceridemia   . ILD (interstitial lung disease) ([place])   . Lower abdominal pain   . Lump in the abdomen   . PSVT (paroxysmal supraventricular tachycardia) ([place])   . Sleep apnea    on CPAP  . Spastic colon   . Tobacco abuse   . Venous insufficiency of both lower extremities     Past Surgical History:  Procedure Laterality Date  . ABDOMINAL HYSTERECTOMY  with left ovary in place 1996  . APPENDECTOMY  1985  . BREAST BIOPSY Left 10/14/2017   calcs bx, fibrosis giant cell reaction and chronic inflammation, negative for malignancy.   Marland Kitchen CARDIAC CATHETERIZATION  2007   stents  . CESAREAN SECTION  1984  . CHOLECYSTECTOMY  1985  . COLONOSCOPY WITH PROPOFOL N/A 09/13/2016   Procedure: COLONOSCOPY WITH PROPOFOL;  Surgeon: Manya Silvas, MD;  Location: [facility] ENDOSCOPY;  Service: Endoscopy;  Laterality: N/A;  . COLONOSCOPY WITH PROPOFOL N/A 11/09/2018   Procedure: COLONOSCOPY WITH PROPOFOL;  Surgeon: Manya Silvas, MD;  Location: [facility] ENDOSCOPY;  Service: Endoscopy;  Laterality: N/A;  . COLONOSCOPY WITH PROPOFOL N/A 03/29/2020  Procedure: COLONOSCOPY WITH PROPOFOL;  Surgeon: Robert Bellow, MD;  Location: [facility] ENDOSCOPY;  Service: Endoscopy;  Laterality: N/A;  . ESOPHAGOGASTRODUODENOSCOPY (EGD) WITH PROPOFOL N/A 02/02/2018   Procedure: ESOPHAGOGASTRODUODENOSCOPY (EGD) WITH PROPOFOL;  Surgeon: Manya Silvas, MD;  Location: [facility] ENDOSCOPY;  Service: Endoscopy;  Laterality: N/A;  . ESOPHAGOGASTRODUODENOSCOPY (EGD) WITH PROPOFOL N/A 03/29/2020   Procedure: ESOPHAGOGASTRODUODENOSCOPY (EGD) WITH PROPOFOL;  Surgeon: Robert Bellow, MD;   Location: [facility] ENDOSCOPY;  Service: Endoscopy;  Laterality: N/A;  . JOINT REPLACEMENT     bilateral knee replacements  . KNEE ARTHROSCOPY  Arthroscopic left knee surgery   . KNEE SURGERY  status post knee surgey   . LEFT HEART CATH AND CORONARY ANGIOGRAPHY Left 05/14/2018   Procedure: LEFT HEART CATH AND CORONARY ANGIOGRAPHY;  Surgeon: Corey Skains, MD;  Location: [place] CV LAB;  Service: Cardiovascular;  Laterality: Left;  . REPLACEMENT TOTAL KNEE  ([facility])  . SHOULDER SURGERY  shoulder operation secondary to a torn tendon    Family History  Problem Relation Age of Onset  . Other Mother        Hit by a fire truck and has had multiple operations on her back , and has history of MVP   . Mitral valve prolapse Mother   . Lung cancer Mother   . Depression Mother   . Heart disease Father        myocardial infarction and is status post bypass surgery  . Mitral valve prolapse Sister   . Bipolar disorder Sister   . Hepatitis C Brother   . Cirrhosis Brother   . Colon cancer Paternal Aunt   . Breast cancer Neg Hx     SOCIAL HX: reviewed.    Current Outpatient Medications:  .  albuterol (VENTOLIN HFA) 108 (90 Base) MCG/ACT inhaler, INHALE 2 PUFFS FOUR TIMES A DAY, Disp: 8.5 g, Rfl: 11 .  amLODipine (NORVASC) 5 MG tablet, Take 1 tablet (5 mg total) by mouth daily., Disp: 90 tablet, Rfl: 1 .  aspirin 81 MG tablet, Take 81 mg by mouth daily. , Disp: , Rfl:  .  atorvastatin (LIPITOR) 20 MG tablet, Take 1 tablet (20 mg total) by mouth every evening., Disp: 90 tablet, Rfl: 0 .  Blood Glucose Monitoring Suppl (TRUE METRIX METER) w/Device KIT, , Disp: , Rfl:  .  brimonidine (ALPHAGAN) 0.2 % ophthalmic solution, Place 1 drop into the right eye 3 (three) times daily., Disp: , Rfl:  .  budesonide-formoterol (SYMBICORT) 160-4.5 MCG/ACT inhaler, Inhale 2 puffs into the lungs in the morning and at bedtime. , Disp: , Rfl:  .  CALCIUM PO, Take 600 mg by mouth daily. , Disp: , Rfl:  .   clopidogrel (PLAVIX) 75 MG tablet, TAKE 1 TABLET EVERY DAY, Disp: 90 tablet, Rfl: 0 .  cyclobenzaprine (FLEXERIL) 10 MG tablet, TAKE 1 TABLET THREE TIMES DAILY AS NEEDED FOR MUSCLE SPASM(S), Disp: 270 tablet, Rfl: 1 .  DULoxetine (CYMBALTA) 60 MG capsule, TAKE 1 CAPSULE (60 MG TOTAL) BY MOUTH DAILY., Disp: 90 capsule, Rfl: 1 .  famotidine (PEPCID) 40 MG tablet, TAKE 1 TABLET EVERY DAY, Disp: 90 tablet, Rfl: 1 .  glucose blood test strip, Use as instructed to check blood sugars twice daily. Dx E11.9., Disp: 100 each, Rfl: 12 .  guaiFENesin-codeine (CHERATUSSIN AC) 100-10 MG/5ML syrup, Take 5 mLs by mouth 3 (three) times daily as needed for cough., Disp: 120 mL, Rfl: 0 .  isosorbide mononitrate (IMDUR) 30 MG 24 hr tablet, Take 1  tablet (30 mg total) by mouth daily., Disp: 30 tablet, Rfl: 12 .  ketorolac (ACULAR) 0.5 % ophthalmic solution, Place 1 drop into the right eye 4 (four) times daily., Disp: , Rfl:  .  lamoTRIgine (LAMICTAL) 25 MG tablet, Take 1 tablet (25 mg total) by mouth daily. FOR MOOD, Disp: 90 tablet, Rfl: 0 .  levofloxacin (LEVAQUIN) 500 MG tablet, Take 1 tablet (500 mg total) by mouth daily., Disp: 7 tablet, Rfl: 0 .  Loperamide HCl (IMODIUM A-D PO), Take 2 tablets by mouth daily as needed. , Disp: , Rfl:  .  metFORMIN (GLUCOPHAGE-XR) 500 MG 24 hr tablet, Take 2 tablets (1,000 mg total) by mouth in the morning and at bedtime., Disp: 360 tablet, Rfl: 1 .  Multiple Vitamin (MULTIVITAMIN PO), Take 1 tablet by mouth daily., Disp: , Rfl:  .  mupirocin ointment (BACTROBAN) 2 %, Apply to affected area on abdomen bid, Disp: 22 g, Rfl: 0 .  nystatin (MYCOSTATIN) 100000 UNIT/ML suspension, 5 cc's swish and spit tid., Disp: 120 mL, Rfl: 0 .  nystatin (MYCOSTATIN/NYSTOP) powder, APPLY TO THE AFFECTED AREA(S) TWICE DAILY, Disp: 15 g, Rfl: 0 .  nystatin cream (MYCOSTATIN), APPLY ONE APPLICATION TOPICALLY 2 TIMES A DAY, Disp: 30 g, Rfl: 0 .  ofloxacin (OCUFLOX) 0.3 % ophthalmic solution, 1 drop 4  (four) times daily., Disp: , Rfl:  .  pantoprazole (PROTONIX) 40 MG tablet, TAKE 1 TABLET TWICE DAILY BEFORE MEALS, Disp: 180 tablet, Rfl: 1 .  prednisoLONE acetate (PRED FORTE) 1 % ophthalmic suspension, Place 1 drop into the right eye 4 (four) times daily., Disp: , Rfl:  .  predniSONE (DELTASONE) 20 MG tablet, Take 1 tablet (20 mg total) by mouth daily with breakfast. 6 tablets on Day 1 , then reduce by 1 tablet daily until gone, Disp: 5 tablet, Rfl: 0 .  propranolol (INDERAL) 40 MG tablet, 1 tab daily PO (Patient taking differently: Take 40 mg by mouth 2 (two) times daily. 1 tab daily PO), Disp: 180 tablet, Rfl: 1 .  telmisartan (MICARDIS) 80 MG tablet, Take 1 tablet (80 mg total) by mouth daily., Disp: 90 tablet, Rfl: 1 .  tolterodine (DETROL LA) 4 MG 24 hr capsule, Take 1 capsule (4 mg total) by mouth daily., Disp: 30 capsule, Rfl: 6 .  traZODone (DESYREL) 100 MG tablet, Take 1-1.5 tablets (100-150 mg total) by mouth at bedtime., Disp: 135 tablet, Rfl: 0 .  TRUEplus Lancets 33G MISC, , Disp: , Rfl:   EXAM:  GENERAL: alert. Appears to be in no acute distress.  Answering questions appropriately.   PULM:  No increased cough with forced expiration.   PSYCH/NEURO: pleasant and cooperative, no obvious depression or anxiety, speech and thought processing grossly intact  ASSESSMENT AND PLAN:  Discussed the following assessment and plan:  Problem List Items Addressed This Visit    Suspected COVID-19 virus infection    Recently evaluated as outlined.  Completing prednisone and levaquin.  Has cheratussin if needed.  Discussed symptoms.  Appears to be some better.  Discussed using albuterol inhaler as directed.  Can use saline nasal spray and afrin nasal spray as directed.  Nystatin suspension as directed.  Discussed cxr if cough persists.  Follow symptoms closely.  Call with update.       Thrush    Suspected thrush.  Burning tongue.  Nystatin suspension as directed.  Follow. Call with update.        Relevant Medications   nystatin (MYCOSTATIN) 100000 UNIT/ML suspension  I discussed the assessment and treatment plan with the patient. The patient was provided an opportunity to ask questions and all were answered. The patient agreed with the plan and demonstrated an understanding of the instructions.   The patient was advised to call back or seek an in-person evaluation if the symptoms worsen or if the condition fails to improve as anticipated.  I provided 24 minutes of non-face-to-face time during this encounter.   Einar Pheasant, MD

## 2020-12-27 ENCOUNTER — Encounter: Payer: Self-pay | Admitting: Internal Medicine

## 2020-12-27 DIAGNOSIS — B370 Candidal stomatitis: Secondary | ICD-10-CM | POA: Insufficient documentation

## 2020-12-27 NOTE — Assessment & Plan Note (Signed)
Recently evaluated as outlined.  Completing prednisone and levaquin.  Has cheratussin if needed.  Discussed symptoms.  Appears to be some better.  Discussed using albuterol inhaler as directed.  Can use saline nasal spray and afrin nasal spray as directed.  Nystatin suspension as directed.  Discussed cxr if cough persists.  Follow symptoms closely.  Call with update.

## 2020-12-27 NOTE — Assessment & Plan Note (Signed)
Suspected thrush.  Burning tongue.  Nystatin suspension as directed.  Follow. Call with update.

## 2020-12-29 ENCOUNTER — Telehealth: Payer: Self-pay | Admitting: Internal Medicine

## 2020-12-29 NOTE — Telephone Encounter (Signed)
If she is not any better and has two virtual visits, she needs in person evaluation.

## 2020-12-29 NOTE — Telephone Encounter (Signed)
You saw her for this on Tuesday

## 2020-12-29 NOTE — Telephone Encounter (Signed)
Pt called she is not feeling any better. She is still having congestion, ear pain and her tongue is raw

## 2020-12-29 NOTE — Telephone Encounter (Signed)
Patient is going to urgent care 

## 2020-12-30 ENCOUNTER — Other Ambulatory Visit: Payer: Self-pay | Admitting: Psychiatry

## 2020-12-30 DIAGNOSIS — F330 Major depressive disorder, recurrent, mild: Secondary | ICD-10-CM

## 2021-01-02 DIAGNOSIS — H25811 Combined forms of age-related cataract, right eye: Secondary | ICD-10-CM | POA: Diagnosis not present

## 2021-01-02 DIAGNOSIS — H2511 Age-related nuclear cataract, right eye: Secondary | ICD-10-CM | POA: Diagnosis not present

## 2021-01-03 ENCOUNTER — Other Ambulatory Visit: Payer: Self-pay | Admitting: Internal Medicine

## 2021-01-04 ENCOUNTER — Other Ambulatory Visit: Payer: Self-pay

## 2021-01-04 ENCOUNTER — Encounter: Payer: Self-pay | Admitting: Psychiatry

## 2021-01-04 ENCOUNTER — Telehealth: Payer: Self-pay

## 2021-01-04 ENCOUNTER — Telehealth (INDEPENDENT_AMBULATORY_CARE_PROVIDER_SITE_OTHER): Payer: Medicare HMO | Admitting: Psychiatry

## 2021-01-04 DIAGNOSIS — F419 Anxiety disorder, unspecified: Secondary | ICD-10-CM

## 2021-01-04 DIAGNOSIS — F330 Major depressive disorder, recurrent, mild: Secondary | ICD-10-CM

## 2021-01-04 DIAGNOSIS — F17200 Nicotine dependence, unspecified, uncomplicated: Secondary | ICD-10-CM | POA: Diagnosis not present

## 2021-01-04 DIAGNOSIS — G4701 Insomnia due to medical condition: Secondary | ICD-10-CM | POA: Diagnosis not present

## 2021-01-04 NOTE — Telephone Encounter (Signed)
90 mg has been sent.

## 2021-01-04 NOTE — Progress Notes (Unsigned)
Virtual Visit via Video Note  I connected with Kathryn Friedman on 01/04/21 at 10:20 AM EST by a video enabled telemedicine application and verified that I am speaking with the correct person using two identifiers.  Location Provider Location : [facility] Patient Location : Home  Participants: Patient , Provider    I discussed the limitations of evaluation and management by telemedicine and the availability of in person appointments. The patient expressed understanding and agreed to proceed.   I discussed the assessment and treatment plan with the patient. The patient was provided an opportunity to ask questions and all were answered. The patient agreed with the plan and demonstrated an understanding of the instructions.   The patient was advised to call back or seek an in-person evaluation if the symptoms worsen or if the condition fails to improve as anticipated.  [place] MD OP Progress Note  01/04/2021 12:57 PM [facility]  MRN:  [id number]  Chief Complaint:  Chief Complaint    Follow-up     HPI: ANATALIA Friedman is a 65 year old Caucasian female, married, retired, on [employer], lives in [place], has a history of MDD, insomnia, anxiety disorder, tobacco use disorder was evaluated by telemedicine today.  Patient with multiple medical problems including obstructive sleep apnea on CPAP, coronary artery disease status post stent placement, COPD, diabetes melitis, hypertension, GERD, interstitial lung disease, iron deficiency, tobacco use disorder.  Patient today reports she recently had her eye surgery.  This was 2 days ago.  She is currently recovering from the same.  Patient reports since her last visit she has been sleeping better.  The medication combination does help.  Patient reports she currently feels depressed and is worried about her husband's health issues.  She reports her husband has not been supportive at all.  He had major medical problems in November  2020.  Since then he has not been doing anything around the house or helping her out.  Patient reports he recovered from his medical problems however in spite of that he continues to stay in bed 15 hours or more.  She reports it has been difficult for her and this does have an impact on her mood.  Patient however reports she has been trying to cope with it and trying to set boundaries.  Patient reports she is planning to talk to his provider about getting him help.  Patient denies any suicidality, homicidality or perceptual disturbances.  She is compliant on medications and denies side effects.  Visit Diagnosis:    ICD-10-CM   1. MDD (major depressive disorder), recurrent episode, mild ([place])  F33.0   2. Insomnia due to medical condition  G47.01   3. Anxiety disorder, unspecified type  F41.9   4. Tobacco use disorder  F17.200     Past Psychiatric History: I have reviewed past psychiatric history from my progress note on 06/26/2020.  Past trials of trazodone, Cymbalta, Lexapro, Rexulti.  Patient was under the care of [facility] in the past.  Past Medical History:  Past Medical History:  Diagnosis Date  . Anemia   . Arthritis    back and knees  . Asthma   . Blood in stool   . Chronic diarrhea   . COPD (chronic obstructive pulmonary disease) ([place])   . Coronary artery disease    s/p stent placement 06/25/06  . Depression    secondary to the death of her husband (died 33)  . Diabetes mellitus without complication ([place]) Diagnosed 05/2008  .  Diverticulitis   . Diverticulosis   . Dizzinesses   . Dysphagia   . Fatty infiltration of liver   . GERD (gastroesophageal reflux disease)   . Headache   . Hypertension   . Hypertriglyceridemia   . ILD (interstitial lung disease) ([place])   . Lower abdominal pain   . Lump in the abdomen   . PSVT (paroxysmal supraventricular tachycardia) ([place])   . Sleep apnea    on CPAP  . Spastic colon   . Tobacco abuse   . Venous insufficiency  of both lower extremities     Past Surgical History:  Procedure Laterality Date  . ABDOMINAL HYSTERECTOMY  with left ovary in place 1996  . APPENDECTOMY  1985  . BREAST BIOPSY Left 10/14/2017   calcs bx, fibrosis giant cell reaction and chronic inflammation, negative for malignancy.   Marland Kitchen CARDIAC CATHETERIZATION  2007   stents  . CESAREAN SECTION  1984  . CHOLECYSTECTOMY  1985  . COLONOSCOPY WITH PROPOFOL N/A 09/13/2016   Procedure: COLONOSCOPY WITH PROPOFOL;  Surgeon: Manya Silvas, MD;  Location: [facility] ENDOSCOPY;  Service: Endoscopy;  Laterality: N/A;  . COLONOSCOPY WITH PROPOFOL N/A 11/09/2018   Procedure: COLONOSCOPY WITH PROPOFOL;  Surgeon: Manya Silvas, MD;  Location[facility] ENDOSCOPY;  Service: Endoscopy;  Laterality: N/A;  . COLONOSCOPY WITH PROPOFOL N/A 03/29/2020   Procedure: COLONOSCOPY WITH PROPOFOL;  Surgeon: Robert Bellow, MD;  Location: [facility] ENDOSCOPY;  Service: Endoscopy;  Laterality: N/A;  . ESOPHAGOGASTRODUODENOSCOPY (EGD) WITH PROPOFOL N/A 02/02/2018   Procedure: ESOPHAGOGASTRODUODENOSCOPY (EGD) WITH PROPOFOL;  Surgeon: Manya Silvas, MD;  Location: [facility] ENDOSCOPY;  Service: Endoscopy;  Laterality: N/A;  . ESOPHAGOGASTRODUODENOSCOPY (EGD) WITH PROPOFOL N/A 03/29/2020   Procedure: ESOPHAGOGASTRODUODENOSCOPY (EGD) WITH PROPOFOL;  Surgeon: Robert Bellow, MD;  Location: [facility] ENDOSCOPY;  Service: Endoscopy;  Laterality: N/A;  . JOINT REPLACEMENT     bilateral knee replacements  . KNEE ARTHROSCOPY  Arthroscopic left knee surgery   . KNEE SURGERY  status post knee surgey   . LEFT HEART CATH AND CORONARY ANGIOGRAPHY Left 05/14/2018   Procedure: LEFT HEART CATH AND CORONARY ANGIOGRAPHY;  Surgeon: Corey Skains, MD;  Location: [facility] CV LAB;  Service: Cardiovascular;  Laterality: Left;  . REPLACEMENT TOTAL KNEE  ([facility])  . SHOULDER SURGERY  shoulder operation secondary to a torn tendon    Family Psychiatric History: I have reviewed family psychiatric  history from my progress note on 06/26/2020  Family History:  Family History  Problem Relation Age of Onset  . Other Mother        Hit by a fire truck and has had multiple operations on her back , and has history of MVP   . Mitral valve prolapse Mother   . Lung cancer Mother   . Depression Mother   . Heart disease Father        myocardial infarction and is status post bypass surgery  . Mitral valve prolapse Sister   . Bipolar disorder Sister   . Hepatitis C Brother   . Cirrhosis Brother   . Colon cancer Paternal Aunt   . Breast cancer Neg Hx     Social History: Reviewed social history from my progress note on 06/26/2020 Social History   Socioeconomic History  . Marital status: Married    Spouse name: Not on file  . Number of children: 1  . Years of education: Not on file  . Highest education level: Not on file  Occupational History  Employer: [employer]  Tobacco Use  . Smoking status: Current Every Day Smoker    Packs/day: 1.50    Years: 45.00    Pack years: 67.50    Types: Cigarettes  . Smokeless tobacco: Never Used  Vaping Use  . Vaping Use: Former  Substance and Sexual Activity  . Alcohol use: No    Alcohol/week: 0.0 standard drinks  . Drug use: No  . Sexual activity: Not on file  Other Topics Concern  . Not on file  Social History Narrative  . Not on file   Social Determinants of Health   Financial Resource Strain: Medium Risk  . Difficulty of Paying Living Expenses: Somewhat hard  Food Insecurity: Not on file  Transportation Needs: Not on file  Physical Activity: Not on file  Stress: Not on file  Social Connections: Not on file    Allergies:  Allergies  Allergen Reactions  . Varenicline Other (See Comments)    "I got really depressed" "I got really depressed" CHANTEX  . Jardiance [Empagliflozin] Other (See Comments)    Yeast infection  . Methylprednisolone Nausea Only and Nausea And Vomiting    Metabolic Disorder Labs: Lab Results  Component Value  Date   HGBA1C 6.2 07/06/2020   No results found for: PROLACTIN Lab Results  Component Value Date   CHOL 118 07/06/2020   TRIG 166.0 (H) 07/06/2020   HDL 32.50 (L) 07/06/2020   CHOLHDL 4 07/06/2020   VLDL 33.2 07/06/2020   LDLCALC 53 07/06/2020   LDLCALC 47 03/17/2020   Lab Results  Component Value Date   TSH 1.56 03/17/2020   TSH 1.42 12/02/2017    Therapeutic Level Labs: No results found for: LITHIUM No results found for: VALPROATE No components found for:  CBMZ  Current Medications: Current Outpatient Medications  Medication Sig Dispense Refill  . albuterol (VENTOLIN HFA) 108 (90 Base) MCG/ACT inhaler INHALE 2 PUFFS FOUR TIMES A DAY 8.5 g 11  . amLODipine (NORVASC) 5 MG tablet Take 1 tablet (5 mg total) by mouth daily. 90 tablet 1  . aspirin 81 MG tablet Take 81 mg by mouth daily.     Marland Kitchen atorvastatin (LIPITOR) 20 MG tablet Take 1 tablet (20 mg total) by mouth every evening. 90 tablet 0  . Blood Glucose Monitoring Suppl (TRUE METRIX METER) w/Device KIT     . brimonidine (ALPHAGAN) 0.2 % ophthalmic solution Place 1 drop into the right eye 3 (three) times daily.    . budesonide-formoterol (SYMBICORT) 160-4.5 MCG/ACT inhaler Inhale 2 puffs into the lungs in the morning and at bedtime.     Marland Kitchen CALCIUM PO Take 600 mg by mouth daily.     . clopidogrel (PLAVIX) 75 MG tablet TAKE 1 TABLET EVERY DAY 90 tablet 0  . cyclobenzaprine (FLEXERIL) 10 MG tablet TAKE 1 TABLET THREE TIMES DAILY AS NEEDED FOR MUSCLE SPASM(S) 270 tablet 1  . DULoxetine (CYMBALTA) 60 MG capsule TAKE 1 CAPSULE (60 MG TOTAL) BY MOUTH DAILY. 90 capsule 1  . famotidine (PEPCID) 40 MG tablet TAKE 1 TABLET EVERY DAY 90 tablet 1  . glucose blood test strip Use as instructed to check blood sugars twice daily. Dx E11.9. 100 each 12  . guaiFENesin-codeine (CHERATUSSIN AC) 100-10 MG/5ML syrup Take 5 mLs by mouth 3 (three) times daily as needed for cough. 120 mL 0  . isosorbide mononitrate (IMDUR) 30 MG 24 hr tablet Take 1  tablet (30 mg total) by mouth daily. 30 tablet 12  . ketorolac (ACULAR) 0.5 %  ophthalmic solution Place 1 drop into the right eye 4 (four) times daily.    Marland Kitchen lamoTRIgine (LAMICTAL) 25 MG tablet Take 1 tablet (25 mg total) by mouth daily. FOR MOOD 90 tablet 0  . levofloxacin (LEVAQUIN) 500 MG tablet Take 1 tablet (500 mg total) by mouth daily. 7 tablet 0  . Loperamide HCl (IMODIUM A-D PO) Take 2 tablets by mouth daily as needed.     . metFORMIN (GLUCOPHAGE-XR) 500 MG 24 hr tablet Take 2 tablets (1,000 mg total) by mouth in the morning and at bedtime. 360 tablet 1  . Multiple Vitamin (MULTIVITAMIN PO) Take 1 tablet by mouth daily.    . mupirocin ointment (BACTROBAN) 2 % Apply to affected area on abdomen bid 22 g 0  . nystatin (MYCOSTATIN) 100000 UNIT/ML suspension 5 cc's swish and spit tid. 120 mL 0  . nystatin (MYCOSTATIN/NYSTOP) powder APPLY TO THE AFFECTED AREA(S) TWICE DAILY 15 g 0  . nystatin cream (MYCOSTATIN) APPLY ONE APPLICATION TOPICALLY 2 TIMES A DAY 30 g 0  . ofloxacin (OCUFLOX) 0.3 % ophthalmic solution 1 drop 4 (four) times daily.    . pantoprazole (PROTONIX) 40 MG tablet TAKE 1 TABLET TWICE DAILY BEFORE MEALS 180 tablet 1  . prednisoLONE acetate (PRED FORTE) 1 % ophthalmic suspension Place 1 drop into the right eye 4 (four) times daily.    . predniSONE (DELTASONE) 20 MG tablet Take 1 tablet (20 mg total) by mouth daily with breakfast. 6 tablets on Day 1 , then reduce by 1 tablet daily until gone 5 tablet 0  . propranolol (INDERAL) 40 MG tablet 1 tab daily PO (Patient taking differently: Take 40 mg by mouth 2 (two) times daily. 1 tab daily PO) 180 tablet 1  . telmisartan (MICARDIS) 80 MG tablet Take 1 tablet (80 mg total) by mouth daily. 90 tablet 1  . tolterodine (DETROL LA) 4 MG 24 hr capsule Take 1 capsule (4 mg total) by mouth daily. 30 capsule 6  . traZODone (DESYREL) 100 MG tablet Take 1-1.5 tablets (100-150 mg total) by mouth at bedtime. 135 tablet 0  . TRUEplus Lancets 33G MISC       No current facility-administered medications for this visit.     Musculoskeletal: Strength & Muscle Tone: UTA Gait & Station: UTA Patient leans: N/A  Psychiatric Specialty Exam: Review of Systems  Eyes:       S/P eye surgery  Psychiatric/Behavioral: Positive for dysphoric mood. The patient is nervous/anxious.   All other systems reviewed and are negative.   There were no vitals taken for this visit.There is no height or weight on file to calculate BMI.  General Appearance: Casual  Eye Contact:  Fair  Speech:  Clear and Coherent  Volume:  Normal  Mood:  Anxious and Dysphoric  Affect:  Congruent  Thought Process:  Goal Directed and Descriptions of Associations: Intact  Orientation:  Full (Time, Place, and Person)  Thought Content: Logical   Suicidal Thoughts:  No  Homicidal Thoughts:  No  Memory:  Immediate;   Fair Recent;   Fair Remote;   Fair  Judgement:  Intact  Insight:  Fair  Psychomotor Activity:  Normal  Concentration:  Concentration: Fair and Attention Span: Fair  Recall:  [employer] of Knowledge: Fair  Language: Fair  Akathisia:  No  Handed:  Right  AIMS (if indicated): UTA  Assets:  Communication Skills Desire for Improvement Housing Social Support  ADL's:  Intact  Cognition: WNL  Sleep:  Improving  Screenings: PHQ2-9   Flowsheet Row Video Visit from 01/04/2021 in [place] Office Visit from 07/20/2020 in [facility] Office Visit from 05/07/2019 in [facility] Office Visit from 08/14/2017 in [facility] Office Visit from 08/06/2016 in [facility]  PHQ-2 Total Score 1 0 2 5 0  PHQ-9 Total Score - '3 2 19 ' -    Flowsheet Row Video Visit from 01/04/2021 in [facility] Admission (Discharged) from 11/09/2018 in [facility] END[facility]  C-SSRS RISK CATEGORY No Risk No Risk       Assessment and Plan:  TEKESHIA KLAHR is a 65 year old Caucasian female who has a history of MDD, anxiety disorder, sleep problems, multiple medical problems was evaluated by telemedicine today.  Patient with psychosocial stressors of the pandemic, recent surgery, relationship struggles with her husband, continues to struggle with anxiety and will benefit from the following plan.  Plan MDD-improving Cymbalta 60 mg p.o. daily Lamictal 25 mg p.o. daily Patient however currently with situational stressors of her husband's health problems, relationship struggles, recent eye surgery -will benefit from psychotherapy sessions.  Anxiety disorder unspecified-some progress Continue CBT. Continue Cymbalta 60 mg p.o. daily  Insomnia-improving Trazodone 100 to 150 mg p.o. nightly . Continue melatonin 5 to 10 mg p.o. nightly as needed.  Tobacco use disorder-improving Provided counseling.  Follow-up in clinic in 1 month or sooner if needed.  I have spent atleast 20 minutes face to face by video with patient today. More than 50 % of the time was spent for preparing to see the patient ( e.g., review of test, records ), ordering medications and test ,psychoeducation and supportive psychotherapy and care coordination,as well as documenting clinical information in electronic health record. This note was generated in part or whole with voice recognition software. Voice recognition is usually quite accurate but there are transcription errors that can and very often do occur. I apologize for any typographical errors that were not detected and corrected.      Ursula Alert, MD 01/05/2021, 7:58 AM

## 2021-01-04 NOTE — Telephone Encounter (Signed)
Pt called and states that [facility] sent her 40mg  of telmisartan (MICARDIS) 80 MG tablet instead of the 80mg . Please research and call pt-She is almost out either way

## 2021-01-09 ENCOUNTER — Telehealth: Payer: Medicare HMO

## 2021-01-10 DIAGNOSIS — H2511 Age-related nuclear cataract, right eye: Secondary | ICD-10-CM | POA: Diagnosis not present

## 2021-01-12 ENCOUNTER — Ambulatory Visit (INDEPENDENT_AMBULATORY_CARE_PROVIDER_SITE_OTHER): Payer: Medicare HMO | Admitting: Pharmacist

## 2021-01-12 DIAGNOSIS — I10 Essential (primary) hypertension: Secondary | ICD-10-CM

## 2021-01-12 DIAGNOSIS — J449 Chronic obstructive pulmonary disease, unspecified: Secondary | ICD-10-CM

## 2021-01-12 DIAGNOSIS — R197 Diarrhea, unspecified: Secondary | ICD-10-CM

## 2021-01-12 DIAGNOSIS — E7800 Pure hypercholesterolemia, unspecified: Secondary | ICD-10-CM

## 2021-01-12 DIAGNOSIS — G4733 Obstructive sleep apnea (adult) (pediatric): Secondary | ICD-10-CM | POA: Diagnosis not present

## 2021-01-12 DIAGNOSIS — E1159 Type 2 diabetes mellitus with other circulatory complications: Secondary | ICD-10-CM

## 2021-01-12 DIAGNOSIS — F32A Depression, unspecified: Secondary | ICD-10-CM

## 2021-01-12 DIAGNOSIS — I2510 Atherosclerotic heart disease of native coronary artery without angina pectoris: Secondary | ICD-10-CM

## 2021-01-12 DIAGNOSIS — F320 Major depressive disorder, single episode, mild: Secondary | ICD-10-CM

## 2021-01-12 MED ORDER — BREZTRI AEROSPHERE 160-9-4.8 MCG/ACT IN AERO
2.0000 | INHALATION_SPRAY | Freq: Two times a day (BID) | RESPIRATORY_TRACT | 3 refills | Status: DC
Start: 2021-01-12 — End: 2021-04-04

## 2021-01-12 NOTE — Chronic Care Management (AMB) (Signed)
Chronic Care Management Pharmacy Note  01/12/2021 Name:  Kathryn Friedman MRN:  [id number] DOB:  1956-10-19  Subjective: Kathryn Friedman is an 65 y.o. year old female who is a primary patient of Einar Pheasant, MD.  The CCM team was consulted for assistance with disease management and care coordination needs.    Engaged with patient by telephone for follow up visit in response to provider referral for pharmacy case management and/or care coordination services.   Consent to Services:  The patient was given the following information about Chronic Care Management services today, agreed to services, and gave verbal consent: 1. CCM service includes personalized support from designated clinical staff supervised by the primary care provider, including individualized plan of care and coordination with other care providers 2. 24/7 contact phone numbers for assistance for urgent and routine care needs. 3. Service will only be billed when office clinical staff spend 20 minutes or more in a month to coordinate care. 4. Only one practitioner may furnish and bill the service in a calendar month. 5.The patient may stop CCM services at any time (effective at the end of the month) by phone call to the office staff. 6. The patient will be responsible for cost sharing (co-pay) of up to 20% of the service fee (after annual deductible is met). Patient agreed to services and consent obtained.  Patient Care Team: Einar Pheasant, MD as PCP - General (Internal Medicine) De Hollingshead, RPH-CPP (Pharmacist)  Recent office visits:  2/1 and 1/25 - video visit w/ Dr. Nicki Reaper, Dr. Derrel Nip for suspected COVID and COVID f/u   Recent consult visits:  1/28 - psychiatry Dr. Shea Evans. Zolpidem discontinued, trazodone re-started.   Hospital visits: None in previous 6 months  Objective:  Lab Results  Component Value Date   CREATININE 0.79 07/06/2020   BUN 10 07/06/2020   GFR 73.19 07/06/2020    GFRNONAA >60 12/24/2018   GFRAA >60 12/24/2018   NA 137 07/06/2020   K 4.2 07/06/2020   CALCIUM 9.5 07/06/2020   CO2 27 07/06/2020    Lab Results  Component Value Date/Time   HGBA1C 6.2 07/06/2020 08:24 AM   HGBA1C 6.1 03/17/2020 09:01 AM   GFR 73.19 07/06/2020 08:24 AM   GFR 82.87 03/17/2020 09:01 AM   MICROALBUR 0.9 03/17/2020 09:01 AM   MICROALBUR <0.7 09/15/2018 10:47 AM    Last diabetic Eye exam:  Lab Results  Component Value Date/Time   HMDIABEYEEXA No Retinopathy 11/06/2020 12:00 AM    Last diabetic Foot exam:  Lab Results  Component Value Date/Time   HMDIABFOOTEX by Dr Cleda Mccreedy - podiatry 12/10/2018 12:00 AM     Lab Results  Component Value Date   CHOL 118 07/06/2020   HDL 32.50 (L) 07/06/2020   LDLCALC 53 07/06/2020   TRIG 166.0 (H) 07/06/2020   CHOLHDL 4 07/06/2020    Hepatic Function Latest Ref Rng & Units 07/06/2020 03/17/2020 09/06/2019  Total Protein 6.0 - 8.3 g/dL 6.7 6.6 6.1  Albumin 3.5 - 5.2 g/dL 4.0 3.7 3.6  AST 0 - 37 U/L '14 16 18  ' ALT 0 - 35 U/L '16 15 14  ' Alk Phosphatase 39 - 117 U/L 64 70 64  Total Bilirubin 0.2 - 1.2 mg/dL 0.4 0.3 0.4  Bilirubin, Direct 0.0 - 0.3 mg/dL 0.1 0.1 0.1    Lab Results  Component Value Date/Time   TSH 1.56 03/17/2020 09:01 AM   TSH 1.42 12/02/2017 09:32 AM    CBC Latest Ref Rng & Units  09/06/2019 04/28/2019 12/24/2018  WBC 4.0 - 10.5 K/uL 8.5 8.6 13.0(H)  Hemoglobin 12.0 - 15.0 g/dL 14.9 13.9 13.6  Hematocrit 36.0 - 46.0 % 44.5 40.4 41.6  Platelets 150.0 - 400.0 K/uL 174.0 181.0 195    No results found for: VD25OH  Clinical ASCVD: No  The ASCVD Risk score Mikey Bussing DC Jr., et al., 2013) failed to calculate for the following reasons:   The valid total cholesterol range is 130 to 320 mg/dL    Depression screen [facility] 2/9 01/04/2021 07/20/2020 05/07/2019  Decreased Interest 0 0 1  Down, Depressed, Hopeless 1 0 1  PHQ - 2 Score 1 0 2  Altered sleeping - 1 0  Tired, decreased energy - 1 0  Change in appetite - 0 0   Feeling bad or failure about yourself  - 1 0  Trouble concentrating - 0 0  Moving slowly or fidgety/restless - 0 0  Suicidal thoughts - 0 0  PHQ-9 Score - 3 2  Difficult doing work/chores - Not difficult at all Not difficult at all  Some recent data might be hidden      Social History   Tobacco Use  Smoking Status Current Every Day Smoker  . Packs/day: 1.50  . Years: 45.00  . Pack years: 67.50  . Types: Cigarettes  Smokeless Tobacco Never Used   BP Readings from Last 3 Encounters:  12/19/20 133/84  09/14/20 (!) 156/89  07/20/20 128/74   Pulse Readings from Last 3 Encounters:  12/19/20 77  09/14/20 81  07/20/20 64   Wt Readings from Last 3 Encounters:  12/26/20 221 lb (100.2 kg)  12/19/20 210 lb (95.3 kg)  09/14/20 210 lb (95.3 kg)    Assessment/Interventions: Review of patient past medical history, allergies, medications, health status, including review of consultants reports, laboratory and other test data, was performed as part of comprehensive evaluation and provision of chronic care management services.   SDOH:  (Social Determinants of Health) assessments and interventions performed: Yes SDOH Interventions   Flowsheet Row Most Recent Value  SDOH Interventions   SDOH Interventions for the Following Domains Tobacco  Financial Strain Interventions Other (Comment)  [manufacturer assistance evaluation]  Stress Interventions Other (Comment)  [engaged with psychiatry]  Tobacco Interventions Cessation Materials Given and Reviewed      CCM Care Plan  Allergies  Allergen Reactions  . Varenicline Other (See Comments)    "I got really depressed" "I got really depressed" CHANTEX  . Jardiance [Empagliflozin] Other (See Comments)    Yeast infection  . Methylprednisolone Nausea Only and Nausea And Vomiting    Medications Reviewed Today    Reviewed by De Hollingshead, RPH-CPP (Pharmacist) on 01/12/21 at [place] List Status: <None>  Medication Order Taking?  Sig Documenting Provider Last Dose Status Informant  albuterol (VENTOLIN HFA) 108 (90 Base) MCG/ACT inhaler [id number] Yes INHALE 2 PUFFS FOUR TIMES A DAY Einar Pheasant, MD Taking Active   amLODipine (NORVASC) 5 MG tablet [id number] Yes Take 1 tablet (5 mg total) by mouth daily. Einar Pheasant, MD Taking Active   aspirin 81 MG tablet [id number] Yes Take 81 mg by mouth daily.  [provider] Taking Active Self  atorvastatin (LIPITOR) 20 MG tablet [id number] Yes Take 1 tablet (20 mg total) by mouth every evening. Einar Pheasant, MD Taking Active   Blood Glucose Monitoring Suppl (TRUE METRIX METER) w/Device Drucie Opitz [id number] Yes  [provider] Taking Active   budesonide-formoterol (SYMBICORT) 160-4.5 MCG/ACT inhaler [id number] Yes Inhale 2 puffs  into the lungs in the morning and at bedtime.  [provider] Taking Active   CALCIUM PO [id number] Yes Take 600 mg by mouth daily.  [provider] Taking Active Self  clopidogrel (PLAVIX) 75 MG tablet [id number] Yes TAKE 1 TABLET EVERY DAY Einar Pheasant, MD Taking Active   cyclobenzaprine (FLEXERIL) 10 MG tablet [id number] Yes TAKE 1 TABLET THREE TIMES DAILY AS NEEDED FOR MUSCLE SPASM(S) Einar Pheasant, MD Taking Active            Med Note Darnelle Maffucci, Arville Lime   Fri Jan 12, 2021  9:11 AM) Taking 2-3 times daily  DULoxetine (CYMBALTA) 60 MG capsule [id number] Yes TAKE 1 CAPSULE (60 MG TOTAL) BY MOUTH DAILY. Einar Pheasant, MD Taking Active   famotidine (PEPCID) 40 MG tablet [id number] Yes TAKE 1 TABLET EVERY DAY Einar Pheasant, MD Taking Active   glucose blood test strip [id number] Yes Use as instructed to check blood sugars twice daily. Dx E11.9. Einar Pheasant, MD Taking Active   isosorbide mononitrate (IMDUR) 30 MG 24 hr tablet [id number] Yes Take 1 tablet (30 mg total) by mouth daily. Einar Pheasant, MD Taking Active   ketorolac (ACULAR) 0.5 % ophthalmic solution [id number] Yes Place 1 drop into the right eye 4 (four) times  daily. [provider] Taking Active   lamoTRIgine (LAMICTAL) 25 MG tablet [id number] Yes Take 1 tablet (25 mg total) by mouth daily. FOR Cyndra Numbers, MD Taking Active   Loperamide HCl (IMODIUM A-D PO) [id number] Yes Take 2 tablets by mouth daily as needed.  [provider] Taking Active Self  metFORMIN (GLUCOPHAGE-XR) 500 MG 24 hr tablet [id number] Yes Take 2 tablets (1,000 mg total) by mouth in the morning and at bedtime. Einar Pheasant, MD Taking Active   Multiple Vitamin (MULTIVITAMIN PO) [id number] Yes Take 1 tablet by mouth daily. [provider] Taking Active Self  mupirocin ointment (BACTROBAN) 2 % [id number] No Apply to affected area on abdomen bid  Patient not taking: Reported on 01/12/2021   Einar Pheasant, MD Not Taking Active   nystatin (MYCOSTATIN) 100000 UNIT/ML suspension [id number] No 5 cc's swish and spit tid.  Patient not taking: Reported on 01/12/2021   Einar Pheasant, MD Not Taking Active   nystatin (MYCOSTATIN/NYSTOP) powder [id number] Yes APPLY TO THE AFFECTED AREA(S) TWICE DAILY Einar Pheasant, MD Taking Active   nystatin cream (MYCOSTATIN) [id number] No APPLY ONE APPLICATION TOPICALLY 2 TIMES A DAY  Patient not taking: Reported on 01/12/2021   Einar Pheasant, MD Not Taking Active   pantoprazole (PROTONIX) 40 MG tablet [id number] Yes TAKE 1 TABLET TWICE DAILY BEFORE MEALS Einar Pheasant, MD Taking Active   prednisoLONE acetate (PRED FORTE) 1 % ophthalmic suspension [id number] Yes Place 1 drop into the right eye 4 (four) times daily. [provider] Taking Active   propranolol (INDERAL) 40 MG tablet [id number] Yes 1 tab daily PO  Patient taking differently: Take 40 mg by mouth 2 (two) times daily. 1 tab daily PO   Einar Pheasant, MD Taking Active   telmisartan (MICARDIS) 80 MG tablet [id number] Yes Take 1 tablet (80 mg total) by mouth daily. Einar Pheasant, MD Taking Active   tolterodine (DETROL LA) 4 MG 24 hr capsule [id number] Yes  Take 1 capsule (4 mg total) by mouth daily. Abbie Sons, MD Taking Active   traZODone (DESYREL) 100 MG tablet [id number] Yes Take 1-1.5 tablets (100-150 mg total) by mouth at bedtime. Ursula Alert, MD Taking Active   TRUEplus Lancets 33G [place] [id number] Yes  [provider] Taking Active           Patient Active Problem List   Diagnosis Date Noted  . Thrush 12/27/2020  . Suspected COVID-19 virus infection 12/20/2020  . MDD (major depressive disorder), recurrent episode, mild ([place]) 11/16/2020  . Abdominal wall abscess 07/30/2020  . Skin lesion 07/23/2020  . Boil 07/20/2020  . MDD (major depressive disorder), recurrent, in full remission ([place]) 06/26/2020  . Insomnia due to medical condition 06/26/2020  . Anxiety disorder 06/26/2020  . Cough 04/03/2020  . Urge incontinence 03/02/2020  . Urinary frequency 02/06/2020  . Bilateral carotid artery stenosis 10/15/2019  . PSVT (paroxysmal supraventricular tachycardia) ([place]) 08/09/2019  . History of migraine headaches 05/10/2019  . Lower abdominal pain 03/21/2019  . Dysphagia 09/15/2018  . ILD (interstitial lung disease) ([place]) 08/17/2017  . Tobacco use disorder 08/11/2016  . Venous insufficiency of both lower extremities 07/10/2016  . Healthcare maintenance 06/18/2016  . Lump in the abdomen 08/06/2015  . Dizziness 08/06/2015  . Fatty infiltration of liver 08/11/2014  . Blood in the stool 08/11/2014  . Acute diarrhea 08/11/2014  . Hemorrhage of gastrointestinal tract 08/07/2014  . GERD (gastroesophageal reflux disease) 10/20/2013  . Iron deficiency anemia 10/20/2013  . Nausea with vomiting 10/19/2013  . LUQ pain 10/19/2013  . Diarrhea 09/26/2013  . Anemia 04/12/2013  . CAD (coronary artery disease) 02/18/2013  . COPD (chronic obstructive pulmonary disease) ([place]) 02/18/2013  . Obstructive sleep apnea 02/18/2013  . Mild depression ([place]) 02/18/2013  . Diverticulosis 02/18/2013  . Diabetes ([place]) 02/17/2013  . Essential  hypertension, benign 02/17/2013  . Hypercholesterolemia 02/17/2013    Immunization History  Administered Date(s) Administered  . Influenza,inj,Quad PF,6+ Mos 09/23/2013, 09/09/2014, 08/01/2015, 08/06/2016, 08/14/2017, 08/14/2018, 09/16/2019, 07/13/2020  . Influenza-Unspecified 10/24/2011  . PFIZER(Purple Top)SARS-COV-2 Vaccination 02/23/2020, 03/15/2020  . Pneumococcal Polysaccharide-23 09/23/2013  . Td 04/21/2018    Conditions to be addressed/monitored:  Hypertension, Hyperlipidemia, Diabetes, Coronary Artery Disease, COPD, Depression and Anxiety  Care Plan : General Pharmacy (Adult)  Updates made by De Hollingshead, RPH-CPP since 01/12/2021 12:00 AM    Problem: COPD, CAD, HLD, HTN     Long-Range Goal: Disease Progression Prevention   This Visit's Progress: On track  Recent Progress: On track  Priority: High  Note:   Current Barriers:  . Unable to independently afford treatment regimen . Complex patient with multiple comorbidities including diabetes, coronary artery disease, COPD, depression/anxiety  Pharmacist Clinical Goal(s):  Marland Kitchen Over the next 90 days, patient will verbalize ability to afford treatment regimen. . Over the next 90 days, patient will adhere to prescribed medication regimen  Interventions: . 1:1 collaboration with Einar Pheasant, MD regarding development and update of comprehensive plan of care as evidenced by provider attestation and co-signature . Inter-disciplinary care team collaboration (see longitudinal plan of care) . Comprehensive medication review performed; medication list updated in electronic medical record  Health Maintenance: . Discussed financial strain. Denies any financial concerns with utilities, food, as her husband manages these financial aspects. Denies any need for Care guide referral at this time  Diabetes: . Controlled; current treatment: metformin XR 1000 mg BID . Current home glucose: fasting: 120-140s; occasional post  prandial checks  . Due for foot exam. Reviewed this with patient . Up to date on yearly eye exam.  . Recommended to continue current regimen  Hypertension, Coronary Artery Disease . Appropriately managed; current treatment: amlodipine 5 mg daily, isosorbide mononitrate 30 mg QAM, propranolol 40 mg BID (also for headaches), telmisartan 80  mg daily;  . Reports elevated BP readings at home, 140-150s/80-90 w/ occasional HR in 100s. Has tried on both her home cuff and her husband's home cuff. Denies checking immediately after smoking, but notes that it is usually after cup of coffee. Denies chest pain, notes occasional "chest tightness" but associates the onset of this sensation with having COVID. Comes and goes. No lightheadedness, palpitations, headache. Pain.  Arlean Hopping for f/u with cardiology Dr. Nehemiah Massed. Encouraged to call his office to discuss elevated home readings and make an appointment for f/u.  Marland Kitchen Recommended to continue current treatment regimen.    Hyperlipidemia, ASCVD Risk Reduction . Controlled per last lipid panel; current treatment: atorvastatin 20 mg daily . Antiplatelet therapy: clopidogrel 75 mg daily, aspirin 81 mg daily  . Recommended to continue current treatment regimen  Chronic Obstructive Pulmonary Disease: Marland Kitchen Uncontrolled; current treatment: Symbicort 160/4.5 mcg 2 puffs BID, albuterol HFA PRN. Reports that she goes through 1 albuterol inhaler ~ monthly, as she is using albuterol QID. Cost with [facility] at [name] is ~$19 per inhaler, much better than $45/inhaler that she pays with her insurance  . She discussed w/ Dr. Raul Del about escalating to triple therapy d/t frequency of symptoms. He was agreeable (see 12/08/19 phone note per Care Everywhere). To [place] patient [place], we will need to send a letter explaining the change as well as a new script. Will do so today under Dr. Nicki Reaper. Called [facility], left message for Dr. Gust Brooms team that  we were doing this. Patient educated to call [facility] on Monday to request they fill and ship the new script for [place]. . Educated patient to stop Symbicort, start Breztri when she receives from patient assistance. Reiterated to rinse mouth out after use.   Depression/Anxiety/Insomnia/Pain: . Not well controlled. Follows w/ Dr. Shea Evans. Current regimen: lamotrigine 25 mg daily; duloxetine 60 mg daily, trazodone 100-150 mg QPM - though patient is taking 200 mg daily, she misunderstood prescription; taking cyclobenzaprine 5 mg up to TID for pain/muscle spasms. Has not tried melatonin as per Dr. Charlcie Cradle recommendation.  . Discussed that trazodone script will run out before it is due for a refill because she is taking more than prescribed. Reduced to 150 mg daily and add OTC melatonin as per Dr. Charlcie Cradle recommendation. Reviewed to avoid self-adjustment of medications.  . Encouraged to communicate w/ Dr. Shea Evans regarding trazodone administration. Encouraged continued collaboration w/ psychiatry and LCSW.   Diarrhea, chronic with GERD: . Worsened d/t medication access. Colestipol unavailable to be ordered at this time, so GI changed her to cholestyramine 4 g packet BID. Patient reports that [name] told her the GoodRx cost is $190, but appears this is actually under her insurance. She is currently taking imodium daily;  Pantoprazole 40 mg BID with famotidine 40 mg daily.  Alanson Aly, confirmed that colestipol is still unavailable to be ordered. Worked with the pharmacy team. 60 packets ran through for $37.43. Contacted patient. She notes that at this time, she does not have the money to fill. She prefers to continue daily imodium, but will contemplate if she can fill this script over the next several weeks.  . Encouraged to continue to communicate with [name] regarding status of colestipol order.   Overactive Bladder: . Well managed; current regimen: tolterodine LA 4 mg,  follows w/ Dr. Bernardo Heater . Recommended to continue current regimen and collaboration w/ urology  Tobacco Abuse: . 1-1.5 packs per day; 50  years of use . Previous quit attempts: unsuccessful using varenicline (night mares) . Triggers to smoke: stress, familial stress lately  . Plans to "get through" cataract surgery prior to starting patches + gum. Next surgery March 8th.  . Encouraged to hold to her plan of starting patches + gum after cataract surgery. Patient has nicotine 21 mg patches + nicotine 4 mg gum.  . Encouraged to continue to focus on other methods of stress reduction.   Patient Goals/Self-Care Activities . Over the next 90 days, patient will:  - take medications as prescribed collaborate with provider on medication access solutions  Follow Up Plan: Telephone follow up appointment with care management team member scheduled for:~  6 weeks       Medication Assistance: Symbicort (and now Breztri)obtained through [employer] medication assistance program.  Enrollment ends 11/24/21  Patient's preferred pharmacy is:  [facility] - [address] [place], [place] [address] Phone: [phone] Fax: [place], [place] [address] Phone: [phone] Fax: [phone]  [id number] [place]: [phone]: [phone] Uses pill box? Yes Pt endorses 100% compliance  Care Plan and Follow Up Patient Decision:  Patient agrees to Care Plan and Follow-up.  Plan: Telephone follow up appointment with care management team member scheduled for:  ~ 6 weeks  Catie TDarnelle Maffucci PharmD, B[place] CCerritosClinical Pharmacist LOccidental Petroleumat BJohnson [employer] [phone]

## 2021-01-12 NOTE — Patient Instructions (Signed)
Visit Information  PATIENT GOALS: Goals Addressed              This Visit's Progress     Patient Stated   .  Medication Monitoring (pt-stated)        Patient Goals/Self-Care Activities . Over the next 90 days, patient will:  - take medications as prescribed collaborate with provider on medication access solutions        Patient verbalizes understanding of instructions provided today and agrees to view in [place].   Telephone follow up appointment with care management team member scheduled for: ~ 6 weeks  [name], PharmD, [place], [place] Clinical Pharmacist [employer] at [employer] [phone]

## 2021-01-15 ENCOUNTER — Ambulatory Visit: Payer: Medicare HMO | Admitting: Pharmacist

## 2021-01-15 DIAGNOSIS — E7800 Pure hypercholesterolemia, unspecified: Secondary | ICD-10-CM

## 2021-01-15 DIAGNOSIS — J449 Chronic obstructive pulmonary disease, unspecified: Secondary | ICD-10-CM

## 2021-01-15 DIAGNOSIS — F320 Major depressive disorder, single episode, mild: Secondary | ICD-10-CM

## 2021-01-15 DIAGNOSIS — I10 Essential (primary) hypertension: Secondary | ICD-10-CM | POA: Diagnosis not present

## 2021-01-15 DIAGNOSIS — E1159 Type 2 diabetes mellitus with other circulatory complications: Secondary | ICD-10-CM

## 2021-01-15 DIAGNOSIS — I2510 Atherosclerotic heart disease of native coronary artery without angina pectoris: Secondary | ICD-10-CM | POA: Diagnosis not present

## 2021-01-15 DIAGNOSIS — F32A Depression, unspecified: Secondary | ICD-10-CM

## 2021-01-15 NOTE — Patient Instructions (Signed)
Visit Information  PATIENT GOALS: Goals Addressed              This Visit's Progress     Patient Stated   .  Medication Monitoring (pt-stated)        Patient Goals/Self-Care Activities . Over the next 90 days, patient will:  - take medications as prescribed collaborate with provider on medication access solutions       Patient verbalizes understanding of instructions provided today and agrees to view in MyChart.   Plan: Telephone follow up appointment with care management team member scheduled for:  ~ 8 weeks as previously scheduled  Catie Airianna Kreischer, PharmD, BCACP, CPP Clinical Pharmacist [facility] at [place] [phone]   

## 2021-01-15 NOTE — Chronic Care Management (AMB) (Signed)
Chronic Care Management Pharmacy Note  01/15/2021 Name:  Kathryn Friedman MRN:  [id number] DOB:  01-20-1956  Subjective: Kathryn Friedman is an 65 y.o. year old female who is a primary patient of Einar Pheasant, MD.  The CCM team was consulted for assistance with disease management and care coordination needs.    Engaged with patient by telephone for response to her call about medication access in response to provider referral for pharmacy case management and/or care coordination services.   Consent to Services:  The patient was given information about Chronic Care Management services, agreed to services, and gave verbal consent prior to initiation of services.  Please see initial visit note for detailed documentation.   Objective:  Lab Results  Component Value Date   CREATININE 0.79 07/06/2020   CREATININE 0.71 03/17/2020   CREATININE 0.66 09/06/2019    Lab Results  Component Value Date   HGBA1C 6.2 07/06/2020       Component Value Date/Time   CHOL 118 07/06/2020 0824   TRIG 166.0 (H) 07/06/2020 0824   HDL 32.50 (L) 07/06/2020 0824   CHOLHDL 4 07/06/2020 0824   VLDL 33.2 07/06/2020 0824   LDLCALC 53 07/06/2020 0824    BP Readings from Last 3 Encounters:  12/19/20 133/84  09/14/20 (!) 156/89  07/20/20 128/74    Assessment: Review of patient past medical history, allergies, medications, health status, including review of consultants reports, laboratory and other test data, was performed as part of comprehensive evaluation and provision of chronic care management services.   SDOH:  (Social Determinants of Health) assessments and interventions performed:    CCM Care Plan  Allergies  Allergen Reactions  . Varenicline Other (See Comments)    "I got really depressed" "I got really depressed" CHANTEX  . Jardiance [Empagliflozin] Other (See Comments)    Yeast infection  . Methylprednisolone Nausea Only and Nausea And Vomiting    Medications  Reviewed Today    Reviewed by De Hollingshead, RPH-CPP (Pharmacist) on 01/12/21 at [place] List Status: <None>  Medication Order Taking? Sig Documenting Provider Last Dose Status Informant  albuterol (VENTOLIN HFA) 108 (90 Base) MCG/ACT inhaler [id number] Yes INHALE 2 PUFFS FOUR TIMES A DAY Einar Pheasant, MD Taking Active   amLODipine (NORVASC) 5 MG tablet [id number] Yes Take 1 tablet (5 mg total) by mouth daily. Einar Pheasant, MD Taking Active   aspirin 81 MG tablet [id number] Yes Take 81 mg by mouth daily.  [provider] Taking Active Self  atorvastatin (LIPITOR) 20 MG tablet [id number] Yes Take 1 tablet (20 mg total) by mouth every evening. Einar Pheasant, MD Taking Active   Blood Glucose Monitoring Suppl (TRUE METRIX METER) w/Device Drucie Opitz [id number] Yes  [provider] Taking Active   budesonide-formoterol (SYMBICORT) 160-4.5 MCG/ACT inhaler [id number] Yes Inhale 2 puffs into the lungs in the morning and at bedtime.  [provider] Taking Active   CALCIUM PO [id number] Yes Take 600 mg by mouth daily.  [provider] Taking Active Self  clopidogrel (PLAVIX) 75 MG tablet [id number] Yes TAKE 1 TABLET EVERY DAY Einar Pheasant, MD Taking Active   cyclobenzaprine (FLEXERIL) 10 MG tablet [id number] Yes TAKE 1 TABLET THREE TIMES DAILY AS NEEDED FOR MUSCLE SPASM(S) Einar Pheasant, MD Taking Active            Med Note De Hollingshead   Fri Jan 12, 2021  9:11 AM) Taking 2-3 times daily  DULoxetine (CYMBALTA) 60 MG capsule [id number] Yes TAKE 1  CAPSULE (60 MG TOTAL) BY MOUTH DAILY. Einar Pheasant, MD Taking Active   famotidine (PEPCID) 40 MG tablet [id number] Yes TAKE 1 TABLET EVERY DAY Einar Pheasant, MD Taking Active   glucose blood test strip [id number] Yes Use as instructed to check blood sugars twice daily. Dx E11.9. Einar Pheasant, MD Taking Active   isosorbide mononitrate (IMDUR) 30 MG 24 hr tablet [id number] Yes Take 1 tablet (30 mg total) by mouth  daily. Einar Pheasant, MD Taking Active   ketorolac (ACULAR) 0.5 % ophthalmic solution [id number] Yes Place 1 drop into the right eye 4 (four) times daily. [provider] Taking Active   lamoTRIgine (LAMICTAL) 25 MG tablet [id number] Yes Take 1 tablet (25 mg total) by mouth daily. FOR Cyndra Numbers, MD Taking Active   Loperamide HCl (IMODIUM A-D PO) [id number] Yes Take 2 tablets by mouth daily as needed.  [provider] Taking Active Self  metFORMIN (GLUCOPHAGE-XR) 500 MG 24 hr tablet [id number] Yes Take 2 tablets (1,000 mg total) by mouth in the morning and at bedtime. Einar Pheasant, MD Taking Active   Multiple Vitamin (MULTIVITAMIN PO) [id number] Yes Take 1 tablet by mouth daily. [provider] Taking Active Self  mupirocin ointment (BACTROBAN) 2 % [id number] No Apply to affected area on abdomen bid  Patient not taking: Reported on 01/12/2021   Einar Pheasant, MD Not Taking Active   nystatin (MYCOSTATIN) 100000 UNIT/ML suspension [id number] No 5 cc's swish and spit tid.  Patient not taking: Reported on 01/12/2021   Einar Pheasant, MD Not Taking Active   nystatin (MYCOSTATIN/NYSTOP) powder [id number] Yes APPLY TO THE AFFECTED AREA(S) TWICE DAILY Einar Pheasant, MD Taking Active   nystatin cream (MYCOSTATIN) [id number] No APPLY ONE APPLICATION TOPICALLY 2 TIMES A DAY  Patient not taking: Reported on 01/12/2021   Einar Pheasant, MD Not Taking Active   pantoprazole (PROTONIX) 40 MG tablet [id number] Yes TAKE 1 TABLET TWICE DAILY BEFORE MEALS Einar Pheasant, MD Taking Active   prednisoLONE acetate (PRED FORTE) 1 % ophthalmic suspension [id number] Yes Place 1 drop into the right eye 4 (four) times daily. [provider] Taking Active   propranolol (INDERAL) 40 MG tablet [id number] Yes 1 tab daily PO  Patient taking differently: Take 40 mg by mouth 2 (two) times daily. 1 tab daily PO   Einar Pheasant, MD Taking Active   telmisartan (MICARDIS) 80 MG tablet  [id number] Yes Take 1 tablet (80 mg total) by mouth daily. Einar Pheasant, MD Taking Active   tolterodine (DETROL LA) 4 MG 24 hr capsule [id number] Yes Take 1 capsule (4 mg total) by mouth daily. Abbie Sons, MD Taking Active   traZODone (DESYREL) 100 MG tablet [id number] Yes Take 1-1.5 tablets (100-150 mg total) by mouth at bedtime. Ursula Alert, MD Taking Active   TRUEplus Lancets 33G Philip [id number] Yes  [provider] Taking Active           Patient Active Problem List   Diagnosis Date Noted  . Thrush 12/27/2020  . Suspected COVID-19 virus infection 12/20/2020  . MDD (major depressive disorder), recurrent episode, mild ([place]) 11/16/2020  . Abdominal wall abscess 07/30/2020  . Skin lesion 07/23/2020  . Boil 07/20/2020  . MDD (major depressive disorder), recurrent, in full remission ([place]) 06/26/2020  . Insomnia due to medical condition 06/26/2020  . Anxiety disorder 06/26/2020  . Cough 04/03/2020  . Urge incontinence 03/02/2020  . Urinary frequency 02/06/2020  . Bilateral carotid artery stenosis 10/15/2019  . PSVT (paroxysmal supraventricular tachycardia) ([place]) 08/09/2019  .  History of migraine headaches 05/10/2019  . Lower abdominal pain 03/21/2019  . Dysphagia 09/15/2018  . ILD (interstitial lung disease) ([place]) 08/17/2017  . Tobacco use disorder 08/11/2016  . Venous insufficiency of both lower extremities 07/10/2016  . Healthcare maintenance 06/18/2016  . Lump in the abdomen 08/06/2015  . Dizziness 08/06/2015  . Fatty infiltration of liver 08/11/2014  . Blood in the stool 08/11/2014  . Acute diarrhea 08/11/2014  . Hemorrhage of gastrointestinal tract 08/07/2014  . GERD (gastroesophageal reflux disease) 10/20/2013  . Iron deficiency anemia 10/20/2013  . Nausea with vomiting 10/19/2013  . LUQ pain 10/19/2013  . Diarrhea 09/26/2013  . Anemia 04/12/2013  . CAD (coronary artery disease) 02/18/2013  . COPD (chronic obstructive pulmonary disease) ([place])  02/18/2013  . Obstructive sleep apnea 02/18/2013  . Mild depression ([place]) 02/18/2013  . Diverticulosis 02/18/2013  . Diabetes ([place]) 02/17/2013  . Essential hypertension, benign 02/17/2013  . Hypercholesterolemia 02/17/2013    Conditions to be addressed/monitored: CAD, HTN, HLD and COPD  Care Plan : General Pharmacy (Adult)  Updates made by De Hollingshead, RPH-CPP since 01/15/2021 12:00 AM    Problem: COPD, CAD, HLD, HTN     Long-Range Goal: Disease Progression Prevention   Recent Progress: On track  Priority: High  Note:   Current Barriers:  . Unable to independently afford treatment regimen . Complex patient with multiple comorbidities including diabetes, coronary artery disease, COPD, depression/anxiety  Pharmacist Clinical Goal(s):  Marland Kitchen Over the next 90 days, patient will verbalize ability to afford treatment regimen. . Over the next 90 days, patient will adhere to prescribed medication regimen  Interventions: . 1:1 collaboration with Einar Pheasant, MD regarding development and update of comprehensive plan of care as evidenced by provider attestation and co-signature . Inter-disciplinary care team collaboration (see longitudinal plan of care) . Comprehensive medication review performed; medication list updated in electronic medical record  Diabetes: . Controlled; current treatment: metformin XR 1000 mg BID . Current home glucose: fasting: 120-140s; occasional post prandial checks  . Due for foot exam. Previously reviewed this with patient . Up to date on yearly eye exam.  . Recommended to continue current regimen  Hypertension, Coronary Artery Disease . Appropriately managed; current treatment: amlodipine 5 mg daily, isosorbide mononitrate 30 mg QAM, propranolol 40 mg BID (also for headaches), telmisartan 80 mg daily;  . Overdue for f/u with cardiology Dr. Nehemiah Massed. Previously encouraged to call his office to discuss elevated home readings and make an appointment for  f/u. This is scheduled. . Recommended to continue current treatment regimen.    Hyperlipidemia, ASCVD Risk Reduction . Controlled per last lipid panel; current treatment: atorvastatin 20 mg daily . Antiplatelet therapy: clopidogrel 75 mg daily, aspirin 81 mg daily  . Recommended to continue current treatment regimen  Chronic Obstructive Pulmonary Disease: Marland Kitchen Uncontrolled; current treatment: Symbicort 160/4.5 mcg 2 puffs BID, albuterol HFA PRN. Reports that she goes through 1 albuterol inhaler ~ monthly, as she is using albuterol QID. Cost with [facility] at [name] is ~$19 per inhaler, much better than $45/inhaler that she pays with her insurance  . Calls today to clarify regimen. She called [facility] to follow up on Bedias order, and they asked if she was discontinuing Symbicort or continuing. Reviewed with patient that Judithann Sauger contains Symbicort medications, so [facility] Sauger will replace Symbicort. Patient verbalized understanding and will call [place] back to clarify and request Breztri be shipped.   Depression/Anxiety/Insomnia/Pain: . Not well controlled. Follows w/ Dr. Shea Evans. Current regimen: lamotrigine 25  mg daily; duloxetine 60 mg daily, trazodone 100-150 mg QPM - though patient is taking 200 mg daily, she misunderstood prescription; taking cyclobenzaprine 5 mg up to TID for pain/muscle spasms. Has not tried melatonin as per Dr. Charlcie Cradle recommendation.  . Previously encouraged to communicate w/ Dr. Shea Evans regarding trazodone administration. Encouraged continued collaboration w/ psychiatry and LCSW.   Diarrhea, chronic with GERD: . Worsened d/t medication access. Colestipol unavailable to be ordered at this time, so GI changed her to cholestyramine 4 g packet BID. Patient reports that [name] told her the GoodRx cost is $190, but appears this is actually under her insurance. She is currently taking imodium daily;  Pantoprazole 40 mg BID with famotidine 40 mg daily.  . Encouraged  to continue to communicate with [name] regarding status of colestipol order.   Overactive Bladder: . Well managed; current regimen: tolterodine LA 4 mg, follows w/ Dr. Bernardo Heater . Recommended to continue current regimen and collaboration w/ urology  Tobacco Abuse: . 1-1.5 packs per day; 50 years of use . Previous quit attempts: unsuccessful using varenicline (night mares) . Triggers to smoke: stress, familial stress lately  . Plans to "get through" cataract surgery prior to starting patches + gum. Next surgery March 8th.  . Encouraged to hold to her plan of starting patches + gum after cataract surgery. Patient has nicotine 21 mg patches + nicotine 4 mg gum.  . Encouraged to continue to focus on other methods of stress reduction.   Patient Goals/Self-Care Activities . Over the next 90 days, patient will:  - take medications as prescribed collaborate with provider on medication access solutions  Follow Up Plan: Telephone follow up appointment with care management team member scheduled for:~  6 weeks as previously scheduled       Medication Assistance: Breztri obtained through [employer] medication assistance program.  Enrollment ends 11/24/21  Follow Up:  Patient agrees to Care Plan and Follow-up.  Plan: Telephone follow up appointment with care management team member scheduled for:  ~ 8 weeks as previously scheduled  [name], PharmD, [place], [place] Clinical Pharmacist [employer] at [employer] [phone]

## 2021-01-16 DIAGNOSIS — E782 Mixed hyperlipidemia: Secondary | ICD-10-CM | POA: Diagnosis not present

## 2021-01-16 DIAGNOSIS — I471 Supraventricular tachycardia: Secondary | ICD-10-CM | POA: Diagnosis not present

## 2021-01-16 DIAGNOSIS — R0602 Shortness of breath: Secondary | ICD-10-CM | POA: Insufficient documentation

## 2021-01-16 DIAGNOSIS — G4733 Obstructive sleep apnea (adult) (pediatric): Secondary | ICD-10-CM | POA: Diagnosis not present

## 2021-01-16 DIAGNOSIS — I25119 Atherosclerotic heart disease of native coronary artery with unspecified angina pectoris: Secondary | ICD-10-CM | POA: Diagnosis not present

## 2021-01-16 DIAGNOSIS — I10 Essential (primary) hypertension: Secondary | ICD-10-CM | POA: Diagnosis not present

## 2021-01-19 ENCOUNTER — Ambulatory Visit (INDEPENDENT_AMBULATORY_CARE_PROVIDER_SITE_OTHER): Payer: Medicare HMO | Admitting: Licensed Clinical Social Worker

## 2021-01-19 ENCOUNTER — Encounter: Payer: Self-pay | Admitting: Licensed Clinical Social Worker

## 2021-01-19 ENCOUNTER — Other Ambulatory Visit: Payer: Self-pay

## 2021-01-19 DIAGNOSIS — F330 Major depressive disorder, recurrent, mild: Secondary | ICD-10-CM | POA: Diagnosis not present

## 2021-01-19 DIAGNOSIS — F4310 Post-traumatic stress disorder, unspecified: Secondary | ICD-10-CM | POA: Diagnosis not present

## 2021-01-19 DIAGNOSIS — F419 Anxiety disorder, unspecified: Secondary | ICD-10-CM

## 2021-01-19 NOTE — Progress Notes (Signed)
Virtual Visit via Video Note  I connected with Kathryn Friedman on 01/19/21 at 10:00 AM EST by a video enabled telemedicine application and verified that I am speaking with the correct person using two identifiers.  Participating Parties Patient Provider  Location: Patient: Home Provider: Home Office   I discussed the limitations of evaluation and management by telemedicine and the availability of in person appointments. The patient expressed understanding and agreed to proceed.  THERAPY PROGRESS NOTE  Session Time: 30 Minutes  Participation Level: Active  Behavioral Response: Well GroomedAlertEuthymic  Type of Therapy: Individual Therapy  Treatment Goals addressed: Coping  Interventions: CBT  Summary: Kathryn Friedman is a 65 y.o. female who presents with minimal depression and anxiety sxs. Pt reported doing "good" today and has kept busy recently due to attending several follow up medical appointments. Pt reported experiencing some pain for the last month in her left hip and leg down to her knees. Pt is already scheduled to see her doctor to treat pain. Pt reported improvement in sleep "after being put back on trazadone and melatonin" and continues to experience "weird" dreams. Pt reported she experiences some flashbacks at least once or more a day and often finds herself thinking about late husband. Pt reported she copes by listening to music, visiting his [place], and talking to neighbor who has become a close friend. Pt reported she plans on socializing with this friend later tonight while current husband will be attending a sleep study. Pt reported it helps to talk with her friend who also recently lost her husband about 2 months ago. Pt reported no other concerns at this time.  Suicidal/Homicidal: No  Therapist Response: Therapist met with patient for follow up. Therapist and patient reviewed pain assessment. Therapist and patient explored current sxs and  coping skills utilized as well as reliance on support system to address grief/loss.  Plan: Return again in 1 month.  Diagnosis: Axis I: Anxiety Disorder NOS, Post Traumatic Stress Disorder and MDD, Recurrent, Mild    Axis II: N/A  Josephine Igo, LCSW, LCAS 01/19/2021

## 2021-01-22 ENCOUNTER — Other Ambulatory Visit: Payer: Self-pay | Admitting: Internal Medicine

## 2021-01-22 DIAGNOSIS — F330 Major depressive disorder, recurrent, mild: Secondary | ICD-10-CM

## 2021-01-23 ENCOUNTER — Telehealth: Payer: Self-pay | Admitting: *Deleted

## 2021-01-23 DIAGNOSIS — F17200 Nicotine dependence, unspecified, uncomplicated: Secondary | ICD-10-CM

## 2021-01-23 DIAGNOSIS — Z122 Encounter for screening for malignant neoplasm of respiratory organs: Secondary | ICD-10-CM

## 2021-01-23 DIAGNOSIS — Z87891 Personal history of nicotine dependence: Secondary | ICD-10-CM

## 2021-01-23 NOTE — Telephone Encounter (Signed)
Contacted and scheduled for annual lung screening scan. Patient is a current smoker with a 73.5 pack year history.  

## 2021-01-23 NOTE — Telephone Encounter (Signed)
Attempted to contact patient to schedule lung screening. Left message to call Shawn.

## 2021-01-25 DIAGNOSIS — M1612 Unilateral primary osteoarthritis, left hip: Secondary | ICD-10-CM | POA: Diagnosis not present

## 2021-01-25 DIAGNOSIS — Z96652 Presence of left artificial knee joint: Secondary | ICD-10-CM | POA: Diagnosis not present

## 2021-01-25 DIAGNOSIS — M25552 Pain in left hip: Secondary | ICD-10-CM | POA: Diagnosis not present

## 2021-01-25 DIAGNOSIS — E1159 Type 2 diabetes mellitus with other circulatory complications: Secondary | ICD-10-CM | POA: Diagnosis not present

## 2021-01-25 DIAGNOSIS — M25562 Pain in left knee: Secondary | ICD-10-CM | POA: Diagnosis not present

## 2021-01-25 DIAGNOSIS — M25452 Effusion, left hip: Secondary | ICD-10-CM | POA: Diagnosis not present

## 2021-01-25 DIAGNOSIS — M659 Synovitis and tenosynovitis, unspecified: Secondary | ICD-10-CM | POA: Diagnosis not present

## 2021-01-26 ENCOUNTER — Telehealth: Payer: Self-pay | Admitting: Pharmacist

## 2021-01-26 NOTE — Telephone Encounter (Signed)
  Chronic Care Management   Note  01/26/2021 Name: Kathryn Friedman MRN: [id number] DOB: 1956/03/05   Attempted to contact patient in response to voicemail he left me. States she got Breztri order in the mail and wants to discuss what to do with Symbicort. Left HIPAA compliant message for patient to return my call at their convenience.    Plan: - If I do not hear back from the patient by end of business today, will attempt call back on Monday   [name], PharmD, [place], [place] Clinical Pharmacist [employer] at [employer] [phone]

## 2021-01-29 ENCOUNTER — Ambulatory Visit: Payer: Medicare HMO | Admitting: Pharmacist

## 2021-01-29 ENCOUNTER — Telehealth: Payer: Self-pay | Admitting: Internal Medicine

## 2021-01-29 DIAGNOSIS — I2510 Atherosclerotic heart disease of native coronary artery without angina pectoris: Secondary | ICD-10-CM

## 2021-01-29 DIAGNOSIS — E7800 Pure hypercholesterolemia, unspecified: Secondary | ICD-10-CM

## 2021-01-29 DIAGNOSIS — I10 Essential (primary) hypertension: Secondary | ICD-10-CM

## 2021-01-29 DIAGNOSIS — E1159 Type 2 diabetes mellitus with other circulatory complications: Secondary | ICD-10-CM

## 2021-01-29 DIAGNOSIS — J449 Chronic obstructive pulmonary disease, unspecified: Secondary | ICD-10-CM

## 2021-01-29 NOTE — Telephone Encounter (Signed)
Returned call. See CCM documentation 

## 2021-01-29 NOTE — Telephone Encounter (Signed)
Received notification from Kathryn Friedman that she is scheduled for upcoming cataract surgery.  (I think tomorrow).  Please confirm with pt she is not having any acute symptoms that would prevent her from having the surgery.  Confirm no chest pain or change in breathing, etc.  If any acute symptoms, needs to be evaluated.

## 2021-01-29 NOTE — Patient Instructions (Signed)
Visit Information  PATIENT GOALS: Goals Addressed              This Visit's Progress     Patient Stated   .  Medication Monitoring (pt-stated)        Patient Goals/Self-Care Activities . Over the next 90 days, patient will:  - take medications as prescribed collaborate with provider on medication access solutions        Patient verbalizes understanding of instructions provided today and agrees to view in MyChart.   Plan: Telephone follow up appointment with care management team member scheduled for:  ~ 4 weeks as previously scheduled  Catie Deyani Hegarty, PharmD, BCACP, CPP Clinical Pharmacist [facility] at [place] [phone]    

## 2021-01-29 NOTE — Telephone Encounter (Signed)
Confirmed patient is doing ok. She has her surgery tomorrow. No acute issues at this time

## 2021-01-29 NOTE — Chronic Care Management (AMB) (Signed)
Chronic Care Management Pharmacy Note  01/29/2021 Name:  Kathryn Friedman MRN:  [id number] DOB:  1955/11/27  Subjective: Kathryn Friedman is an 65 y.o. year old female who is a primary patient of Einar Pheasant, MD.  The CCM team was consulted for assistance with disease management and care coordination needs.    Engaged with patient by telephone for reponse to voicemail with question regarding medication management in response to provider referral for pharmacy case management and/or care coordination services.   Consent to Services:  The patient was given information about Chronic Care Management services, agreed to services, and gave verbal consent prior to initiation of services.  Please see initial visit note for detailed documentation.   Patient Care Team: Einar Pheasant, MD as PCP - General (Internal Medicine) De Hollingshead, RPH-CPP (Pharmacist)  Recent office visits: None since our last appt  Recent consult visits:  01/16/21 - cardiology Dr. Nehemiah Massed; ordered stress test, ECHO, increased telmisartan to 80 mg daily   01/19/21 - therapist [name], LCSW  01/25/21- orthopedics Dr. Rudene Christians, IA injection  Hospital visits: None in previous 6 months  Objective:  Lab Results  Component Value Date   CREATININE 0.79 07/06/2020   BUN 10 07/06/2020   GFR 73.19 07/06/2020   GFRNONAA >60 12/24/2018   GFRAA >60 12/24/2018   NA 137 07/06/2020   K 4.2 07/06/2020   CALCIUM 9.5 07/06/2020   CO2 27 07/06/2020    Lab Results  Component Value Date/Time   HGBA1C 6.2 07/06/2020 08:24 AM   HGBA1C 6.1 03/17/2020 09:01 AM   GFR 73.19 07/06/2020 08:24 AM   GFR 82.87 03/17/2020 09:01 AM   MICROALBUR 0.9 03/17/2020 09:01 AM   MICROALBUR <0.7 09/15/2018 10:47 AM    Last diabetic Eye exam:  Lab Results  Component Value Date/Time   HMDIABEYEEXA No Retinopathy 11/06/2020 12:00 AM    Last diabetic Foot exam:  Lab Results  Component Value Date/Time    HMDIABFOOTEX by Dr Cleda Mccreedy - podiatry 12/10/2018 12:00 AM     Lab Results  Component Value Date   CHOL 118 07/06/2020   HDL 32.50 (L) 07/06/2020   LDLCALC 53 07/06/2020   TRIG 166.0 (H) 07/06/2020   CHOLHDL 4 07/06/2020    Hepatic Function Latest Ref Rng & Units 07/06/2020 03/17/2020 09/06/2019  Total Protein 6.0 - 8.3 g/dL 6.7 6.6 6.1  Albumin 3.5 - 5.2 g/dL 4.0 3.7 3.6  AST 0 - 37 U/L '14 16 18  ' ALT 0 - 35 U/L '16 15 14  ' Alk Phosphatase 39 - 117 U/L 64 70 64  Total Bilirubin 0.2 - 1.2 mg/dL 0.4 0.3 0.4  Bilirubin, Direct 0.0 - 0.3 mg/dL 0.1 0.1 0.1    Lab Results  Component Value Date/Time   TSH 1.56 03/17/2020 09:01 AM   TSH 1.42 12/02/2017 09:32 AM    CBC Latest Ref Rng & Units 09/06/2019 04/28/2019 12/24/2018  WBC 4.0 - 10.5 K/uL 8.5 8.6 13.0(H)  Hemoglobin 12.0 - 15.0 g/dL 14.9 13.9 13.6  Hematocrit 36.0 - 46.0 % 44.5 40.4 41.6  Platelets 150.0 - 400.0 K/uL 174.0 181.0 195    Clinical ASCVD: Yes - CAD  Depression screen [facility] 2/9 01/04/2021 07/20/2020 05/07/2019  Decreased Interest 0 0 1  Down, Depressed, Hopeless 1 0 1  PHQ - 2 Score 1 0 2  Altered sleeping - 1 0  Tired, decreased energy - 1 0  Change in appetite - 0 0  Feeling bad or failure about yourself  -  1 0  Trouble concentrating - 0 0  Moving slowly or fidgety/restless - 0 0  Suicidal thoughts - 0 0  PHQ-9 Score - 3 2  Difficult doing work/chores - Not difficult at all Not difficult at all  Some recent data might be hidden      Social History   Tobacco Use  Smoking Status Current Every Day Smoker  . Packs/day: 1.50  . Years: 45.00  . Pack years: 67.50  . Types: Cigarettes  Smokeless Tobacco Never Used   BP Readings from Last 3 Encounters:  12/19/20 133/84  09/14/20 (!) 156/89  07/20/20 128/74   Pulse Readings from Last 3 Encounters:  12/19/20 77  09/14/20 81  07/20/20 64   Wt Readings from Last 3 Encounters:  12/26/20 221 lb (100.2 kg)  12/19/20 210 lb (95.3 kg)  09/14/20 210 lb (95.3 kg)     Assessment/Interventions: Review of patient past medical history, allergies, medications, health status, including review of consultants reports, laboratory and other test data, was performed as part of comprehensive evaluation and provision of chronic care management services.   SDOH:  (Social Determinants of Health) assessments and interventions performed: Yes SDOH Interventions   Flowsheet Row Most Recent Value  SDOH Interventions   Financial Strain Interventions Other (Comment)  [manufacturer assistance]      CCM Care Plan  Allergies  Allergen Reactions  . Varenicline Other (See Comments)    "I got really depressed" "I got really depressed" CHANTEX  . Jardiance [Empagliflozin] Other (See Comments)    Yeast infection  . Methylprednisolone Nausea Only and Nausea And Vomiting    Medications Reviewed Today    Reviewed by Josephine Igo, LCSW (Social Worker) on 01/19/21 at [place] List Status: <None>  Medication Order Taking? Sig Documenting Provider Last Dose Status Informant  albuterol (VENTOLIN HFA) 108 (90 Base) MCG/ACT inhaler [id number] No INHALE 2 PUFFS FOUR TIMES A DAY Einar Pheasant, MD Taking Active   amLODipine (NORVASC) 5 MG tablet [id number] No Take 1 tablet (5 mg total) by mouth daily. Einar Pheasant, MD Taking Active   aspirin 81 MG tablet [id number] No Take 81 mg by mouth daily.  [provider] Taking Active Self  atorvastatin (LIPITOR) 20 MG tablet [id number] No Take 1 tablet (20 mg total) by mouth every evening. Einar Pheasant, MD Taking Active   Blood Glucose Monitoring Suppl (TRUE METRIX METER) w/Device Drucie Opitz [id number] No  [provider] Taking Active   Budeson-Glycopyrrol-Formoterol (BREZTRI AEROSPHERE) 160-9-4.8 MCG/ACT AERO [id number]  Inhale 2 puffs into the lungs in the morning and at bedtime. Einar Pheasant, MD  Active   CALCIUM PO [id number] No Take 600 mg by mouth daily.  [provider] Taking Active Self  clopidogrel  (PLAVIX) 75 MG tablet [id number] No TAKE 1 TABLET EVERY DAY Einar Pheasant, MD Taking Active   cyclobenzaprine (FLEXERIL) 10 MG tablet [id number] No TAKE 1 TABLET THREE TIMES DAILY AS NEEDED FOR MUSCLE SPASM(S) Einar Pheasant, MD Taking Active            Med Note Darnelle Maffucci, Arville Lime   Fri Jan 12, 2021  9:11 AM) Taking 2-3 times daily  DULoxetine (CYMBALTA) 60 MG capsule [id number] No TAKE 1 CAPSULE (60 MG TOTAL) BY MOUTH DAILY. Einar Pheasant, MD Taking Active   famotidine (PEPCID) 40 MG tablet [id number] No TAKE 1 TABLET EVERY DAY Einar Pheasant, MD Taking Active   glucose blood test strip [id number] No Use as instructed to check blood sugars twice daily. Dx E11.9.  Einar Pheasant, MD Taking Active   isosorbide mononitrate (IMDUR) 30 MG 24 hr tablet [id number] No Take 1 tablet (30 mg total) by mouth daily. Einar Pheasant, MD Taking Active   ketorolac (ACULAR) 0.5 % ophthalmic solution [id number] No Place 1 drop into the right eye 4 (four) times daily. [provider] Taking Active   L-FORMULA LYSINE HCL PO [id number] No Take by mouth. [provider] Taking Active   lamoTRIgine (LAMICTAL) 25 MG tablet [id number] No Take 1 tablet (25 mg total) by mouth daily. FOR Cyndra Numbers, MD Taking Active   Loperamide HCl (IMODIUM A-D PO) [id number] No Take 2 tablets by mouth daily as needed.  [provider] Taking Active Self  metFORMIN (GLUCOPHAGE-XR) 500 MG 24 hr tablet [id number] No Take 2 tablets (1,000 mg total) by mouth in the morning and at bedtime. Einar Pheasant, MD Taking Active   Multiple Vitamin (MULTIVITAMIN PO) [id number] No Take 1 tablet by mouth daily. [provider] Taking Active Self  mupirocin ointment (BACTROBAN) 2 % [id number] No Apply to affected area on abdomen bid  Patient not taking: Reported on 01/12/2021   Einar Pheasant, MD Not Taking Active   nystatin (MYCOSTATIN) 100000 UNIT/ML suspension [id number] No 5 cc's swish and spit tid.  Patient not  taking: Reported on 01/12/2021   Einar Pheasant, MD Not Taking Active   nystatin (MYCOSTATIN/NYSTOP) powder [id number] No APPLY TO THE AFFECTED AREA(S) TWICE DAILY Einar Pheasant, MD Taking Active   nystatin cream (MYCOSTATIN) [id number] No APPLY ONE APPLICATION TOPICALLY 2 TIMES A DAY  Patient not taking: Reported on 01/12/2021   Einar Pheasant, MD Not Taking Active   pantoprazole (PROTONIX) 40 MG tablet [id number] No TAKE 1 TABLET TWICE DAILY BEFORE MEALS Einar Pheasant, MD Taking Active   Potassium 99 MG TABS [id number] No Take by mouth. [provider] Taking Active   prednisoLONE acetate (PRED FORTE) 1 % ophthalmic suspension [id number] No Place 1 drop into the right eye 4 (four) times daily. [provider] Taking Active   propranolol (INDERAL) 40 MG tablet [id number] No 1 tab daily PO  Patient taking differently: Take 40 mg by mouth 2 (two) times daily. 1 tab daily PO   Einar Pheasant, MD Taking Active   telmisartan (MICARDIS) 80 MG tablet [id number] No Take 1 tablet (80 mg total) by mouth daily. Einar Pheasant, MD Taking Active   tolterodine (DETROL LA) 4 MG 24 hr capsule [id number] No Take 1 capsule (4 mg total) by mouth daily. Abbie Sons, MD Taking Active   traZODone (DESYREL) 100 MG tablet [id number] No Take 1-1.5 tablets (100-150 mg total) by mouth at bedtime. Ursula Alert, MD Taking Active   TRUEplus Lancets 33G [place] [id number] No  [provider] Taking Active           Patient Active Problem List   Diagnosis Date Noted  . Thrush 12/27/2020  . Suspected COVID-19 virus infection 12/20/2020  . MDD (major depressive disorder), recurrent episode, mild ([place]) 11/16/2020  . Abdominal wall abscess 07/30/2020  . Skin lesion 07/23/2020  . Boil 07/20/2020  . MDD (major depressive disorder), recurrent, in full remission ([place]) 06/26/2020  . Insomnia due to medical condition 06/26/2020  . Anxiety disorder 06/26/2020  . Cough 04/03/2020  . Urge  incontinence 03/02/2020  . Urinary frequency 02/06/2020  . Bilateral carotid artery stenosis 10/15/2019  . PSVT (paroxysmal supraventricular tachycardia) ([place]) 08/09/2019  . History of migraine headaches 05/10/2019  . Lower abdominal pain 03/21/2019  . Dysphagia 09/15/2018  .  ILD (interstitial lung disease) ([place]) 08/17/2017  . Tobacco use disorder 08/11/2016  . Venous insufficiency of both lower extremities 07/10/2016  . Healthcare maintenance 06/18/2016  . Lump in the abdomen 08/06/2015  . Dizziness 08/06/2015  . Fatty infiltration of liver 08/11/2014  . Blood in the stool 08/11/2014  . Acute diarrhea 08/11/2014  . Hemorrhage of gastrointestinal tract 08/07/2014  . GERD (gastroesophageal reflux disease) 10/20/2013  . Iron deficiency anemia 10/20/2013  . Nausea with vomiting 10/19/2013  . LUQ pain 10/19/2013  . Diarrhea 09/26/2013  . Anemia 04/12/2013  . CAD (coronary artery disease) 02/18/2013  . COPD (chronic obstructive pulmonary disease) ([place]) 02/18/2013  . Obstructive sleep apnea 02/18/2013  . Mild depression ([place]) 02/18/2013  . Diverticulosis 02/18/2013  . Diabetes ([place]) 02/17/2013  . Essential hypertension, benign 02/17/2013  . Hypercholesterolemia 02/17/2013    Immunization History  Administered Date(s) Administered  . Influenza,inj,Quad PF,6+ Mos 09/23/2013, 09/09/2014, 08/01/2015, 08/06/2016, 08/14/2017, 08/14/2018, 09/16/2019, 07/13/2020  . Influenza-Unspecified 10/24/2011  . PFIZER(Purple Top)SARS-COV-2 Vaccination 02/23/2020, 03/15/2020  . Pneumococcal Polysaccharide-23 09/23/2013  . Td 04/21/2018    Conditions to be addressed/monitored:  Hypertension, Hyperlipidemia and COPD  Care Plan : General Pharmacy (Adult)  Updates made by De Hollingshead, RPH-CPP since 01/29/2021 12:00 AM    Problem: COPD, CAD, HLD, HTN     Long-Range Goal: Disease Progression Prevention   Recent Progress: On track  Priority: High  Note:   Current Barriers:  . Unable to  independently afford treatment regimen . Complex patient with multiple comorbidities including diabetes, coronary artery disease, COPD, depression/anxiety  Pharmacist Clinical Goal(s):  Marland Kitchen Over the next 90 days, patient will verbalize ability to afford treatment regimen. . Over the next 90 days, patient will adhere to prescribed medication regimen  Interventions: . 1:1 collaboration with Einar Pheasant, MD regarding development and update of comprehensive plan of care as evidenced by provider attestation and co-signature . Inter-disciplinary care team collaboration (see longitudinal plan of care) . Comprehensive medication review performed; medication list updated in electronic medical record  Diabetes: . Controlled; current treatment: metformin XR 1000 mg BID . Current home glucose: fasting: 120-140s; occasional post prandial checks  . Due for foot exam. Previously reviewed this with patient . Up to date on yearly eye exam.  . Previously recommended to continue current regimen  Hypertension, Coronary Artery Disease . Appropriately managed; current treatment: amlodipine 5 mg daily, isosorbide mononitrate 30 mg QAM, propranolol 40 mg BID (also for headaches), telmisartan 80 mg daily;  . Overdue for f/u with cardiology Dr. Nehemiah Massed. Previously encouraged to call his office to discuss elevated home readings and make an appointment for f/u. This is scheduled. . Recommended to continue current treatment regimen.    Hyperlipidemia, ASCVD Risk Reduction . Controlled per last lipid panel; current treatment: atorvastatin 20 mg daily . Antiplatelet therapy: clopidogrel 75 mg daily, aspirin 81 mg daily  . Recommended to continue current treatment regimen  Chronic Obstructive Pulmonary Disease: Marland Kitchen Uncontrolled; current treatment: Breztri 160/9/4.8 mcg 2 puffs BID; albuterol HFA PRN. Received Breztri from PAP last week, has been using for a few days now. Wondered if she was supposed to continue  Symbicort as well. Denies breathing concerns. Notes that she has continued to use albuterol QID because "that's what it says on the box";  o Albuterol cost with [facility] at [name] is ~$19 per inhaler, much better than $45/inhaler that she pays with her insurance  . Reviewed concepts of maintenance vs rescue inhaler. Reviewed that she only needs  to take albuterol if she has breakthrough symptoms such as SOB, cough, etc. She verbalized understanding.   Depression/Anxiety/Insomnia/Pain: . Moderately well controlled. Follows w/ Dr. Shea Evans. Current regimen: lamotrigine 25 mg daily; duloxetine 60 mg daily, trazodone 100-150 mg QPM;; taking cyclobenzaprine 5 mg up to TID for pain/muscle spasms. Has not tried melatonin as per Dr. Charlcie Cradle recommendation.  . Encouraged continued collaboration w/ psychiatry and LCSW.   Diarrhea, chronic with GERD: . Worsened d/t medication access. Colestipol unavailable to be ordered at this time, so GI changed her to cholestyramine 4 g packet BID. Patient reports that [name] told her the Good[facility] cost is $190, but appears this is actually under her insurance. She is currently taking imodium daily;  Pantoprazole 40 mg BID with famotidine 40 mg daily.  . Encouraged to continue to communicate with [name] regarding status of colestipol order.   Overactive Bladder: . Well managed; current regimen: tolterodine LA 4 mg, follows w/ Dr. Bernardo Heater . Recommended to continue current regimen and collaboration w/ urology  Tobacco Abuse: . 1-1.5 packs per day; 50 years of use . Previous quit attempts: unsuccessful using varenicline (night mares) . Triggers to smoke: stress, familial stress lately  . Plans to "get through" cataract surgery prior to starting patches + gum. Next surgery March 8th.  . Encouraged to hold to her plan of starting patches + gum after cataract surgery. Patient has nicotine 21 mg patches + nicotine 4 mg gum.  . Encouraged to continue to focus on  other methods of stress reduction.   Patient Goals/Self-Care Activities . Over the next 90 days, patient will:  - take medications as prescribed collaborate with provider on medication access solutions  Follow Up Plan: Telephone follow up appointment with care management team member scheduled for:~  4 weeks as previously scheduled        Medication Assistance: Breztri obtained through [employer] medication assistance program.  Enrollment ends 11/24/21  Patient's preferred pharmacy is:  [facility] [place], [address] [place] [address] Phone: [phone] Fax: [place], [place] [address] Phone: [phone] Fax: [phone]   Care Plan and Follow Up Patient Decision:  Patient agrees to Care Plan and Follow-up.  Plan: Telephone follow up appointment with care management team member scheduled for:  ~ 4 weeks as previously scheduled  [name], PharmD, [place], [place] Clinical Pharmacist [employer] [facility] [employer] [phone]

## 2021-01-30 DIAGNOSIS — H2512 Age-related nuclear cataract, left eye: Secondary | ICD-10-CM | POA: Diagnosis not present

## 2021-01-30 DIAGNOSIS — H25012 Cortical age-related cataract, left eye: Secondary | ICD-10-CM | POA: Diagnosis not present

## 2021-01-30 DIAGNOSIS — H25812 Combined forms of age-related cataract, left eye: Secondary | ICD-10-CM | POA: Diagnosis not present

## 2021-01-31 ENCOUNTER — Other Ambulatory Visit: Payer: Self-pay

## 2021-01-31 ENCOUNTER — Encounter: Payer: Self-pay | Admitting: Psychiatry

## 2021-01-31 ENCOUNTER — Telehealth (INDEPENDENT_AMBULATORY_CARE_PROVIDER_SITE_OTHER): Payer: Medicare HMO | Admitting: Psychiatry

## 2021-01-31 DIAGNOSIS — F330 Major depressive disorder, recurrent, mild: Secondary | ICD-10-CM

## 2021-01-31 DIAGNOSIS — G4701 Insomnia due to medical condition: Secondary | ICD-10-CM

## 2021-01-31 DIAGNOSIS — F17200 Nicotine dependence, unspecified, uncomplicated: Secondary | ICD-10-CM | POA: Diagnosis not present

## 2021-01-31 DIAGNOSIS — F418 Other specified anxiety disorders: Secondary | ICD-10-CM | POA: Diagnosis not present

## 2021-01-31 NOTE — Progress Notes (Signed)
Virtual Visit via Video Note  I connected with Kathryn Friedman on 01/31/21 at  2:20 PM EST by a video enabled telemedicine application and verified that I am speaking with the correct person using two identifiers.  Location Provider Location : [facility] Patient Location : Home  Participants: Patient , Provider    I discussed the limitations of evaluation and management by telemedicine and the availability of in person appointments. The patient expressed understanding and agreed to proceed.   I discussed the assessment and treatment plan with the patient. The patient was provided an opportunity to ask questions and all were answered. The patient agreed with the plan and demonstrated an understanding of the instructions.   The patient was advised to call back or seek an in-person evaluation if the symptoms worsen or if the condition fails to improve as anticipated.   [place] MD OP Progress Note  01/31/2021 6:40 PM Kathryn Friedman  MRN:  [id number]  Chief Complaint:  Chief Complaint    Follow-up; Anxiety     HPI: Kathryn Friedman is a 65 year old Caucasian female, married, retired, on [employer], lives in [place], has a history of MDD, insomnia, anxiety disorder, tobacco use disorder was evaluated by telemedicine today.  Patient has multiple medical problems including recent eye surgery, obstructive sleep apnea on CPAP, coronary artery disease, status post stent placement, COPD, diabetes melitis, hypertension, GERD, interstitial lung disease, iron deficiency, tobacco use disorder.  Patient reports she had her left-sided eye surgery yesterday.  She is recovering well.  Her vision has gotten better.  She reports she has upcoming appointment with her surgeon for a follow-up in a few days.  Patient reports she does struggle with hip pain and low back pain since the past few days.  Last week she could not sleep because of that.  She had to go for an injection and that helped to some  extent.  She currently sleeps around 6 hours.  She continues to use CPAP.  Patient reports she has stopped enabling her husband who has been very dependent.  She reports he has has made some minor changes which is helpful.  Patient denies any significant depression or anxiety symptoms.  She denies any suicidality, homicidality or perceptual disturbances.  She continues to follow-up with her therapist and reports therapy sessions are beneficial.   Visit Diagnosis:    ICD-10-CM   1. MDD (major depressive disorder), recurrent episode, mild ([place])  F33.0   2. Insomnia due to medical condition  G47.01   3. Other specified anxiety disorders  F41.8   4. Tobacco use disorder  F17.200     Past Psychiatric History: I have reviewed past psychiatric history from my progress note on 06/26/2020.  Past trials of trazodone, Cymbalta, Lexapro, Rexulti.  Patient was under the care of [facility] in the past.  Past Medical History:  Past Medical History:  Diagnosis Date  . Anemia   . Arthritis    back and knees  . Asthma   . Blood in stool   . Chronic diarrhea   . COPD (chronic obstructive pulmonary disease) ([place])   . Coronary artery disease    s/p stent placement 06/25/06  . Depression    secondary to the death of her husband (died 51)  . Diabetes mellitus without complication ([place]) Diagnosed 05/2008  . Diverticulitis   . Diverticulosis   . Dizzinesses   . Dysphagia   . Fatty infiltration of liver   . GERD (gastroesophageal reflux disease)   .  Headache   . Hypertension   . Hypertriglyceridemia   . ILD (interstitial lung disease) ([place])   . Lower abdominal pain   . Lump in the abdomen   . PSVT (paroxysmal supraventricular tachycardia) ([place])   . Sleep apnea    on CPAP  . Spastic colon   . Tobacco abuse   . Venous insufficiency of both lower extremities     Past Surgical History:  Procedure Laterality Date  . ABDOMINAL HYSTERECTOMY  with left ovary in place 1996  .  APPENDECTOMY  1985  . BREAST BIOPSY Left 10/14/2017   calcs bx, fibrosis giant cell reaction and chronic inflammation, negative for malignancy.   Marland Kitchen CARDIAC CATHETERIZATION  2007   stents  . CESAREAN SECTION  1984  . CHOLECYSTECTOMY  1985  . COLONOSCOPY WITH PROPOFOL N/A 09/13/2016   Procedure: COLONOSCOPY WITH PROPOFOL;  Surgeon: Manya Silvas, MD;  Location: [facility] ENDOSCOPY;  Service: Endoscopy;  Laterality: N/A;  . COLONOSCOPY WITH PROPOFOL N/A 11/09/2018   Procedure: COLONOSCOPY WITH PROPOFOL;  Surgeon: Manya Silvas, MD;  Location: [facility] ENDOSCOPY;  Service: Endoscopy;  Laterality: N/A;  . COLONOSCOPY WITH PROPOFOL N/A 03/29/2020   Procedure: COLONOSCOPY WITH PROPOFOL;  Surgeon: Robert Bellow, MD;  Location: [facility] ENDOSCOPY;  Service: Endoscopy;  Laterality: N/A;  . ESOPHAGOGASTRODUODENOSCOPY (EGD) WITH PROPOFOL N/A 02/02/2018   Procedure: ESOPHAGOGASTRODUODENOSCOPY (EGD) WITH PROPOFOL;  Surgeon: Manya Silvas, MD;  Location[facility] ENDOSCOPY;  Service: Endoscopy;  Laterality: N/A;  . ESOPHAGOGASTRODUODENOSCOPY (EGD) WITH PROPOFOL N/A 03/29/2020   Procedure: ESOPHAGOGASTRODUODENOSCOPY (EGD) WITH PROPOFOL;  Surgeon: Robert Bellow, MD;  Location: [facility] ENDOSCOPY;  Service: Endoscopy;  Laterality: N/A;  . JOINT REPLACEMENT     bilateral knee replacements  . KNEE ARTHROSCOPY  Arthroscopic left knee surgery   . KNEE SURGERY  status post knee surgey   . LEFT HEART CATH AND CORONARY ANGIOGRAPHY Left 05/14/2018   Procedure: LEFT HEART CATH AND CORONARY ANGIOGRAPHY;  Surgeon: Corey Skains, MD;  Location: [facility] CV LAB;  Service: Cardiovascular;  Laterality: Left;  . REPLACEMENT TOTAL KNEE  ([facility])  . SHOULDER SURGERY  shoulder operation secondary to a torn tendon    Family Psychiatric History: I have reviewed family psychiatric history from my progress note on 06/26/2020.  Family History:  Family History  Problem Relation Age of Onset  . Other Mother        Hit by a fire  truck and has had multiple operations on her back , and has history of MVP   . Mitral valve prolapse Mother   . Lung cancer Mother   . Depression Mother   . Heart disease Father        myocardial infarction and is status post bypass surgery  . Mitral valve prolapse Sister   . Bipolar disorder Sister   . Hepatitis C Brother   . Cirrhosis Brother   . Colon cancer Paternal Aunt   . Breast cancer Neg Hx     Social History: I have reviewed social history from my progress note on 06/26/2020. Social History   Socioeconomic History  . Marital status: Married    Spouse name: Not on file  . Number of children: 1  . Years of education: Not on file  . Highest education level: Not on file  Occupational History    Employer: [employer]  Tobacco Use  . Smoking status: Current Every Day Smoker    Packs/day: 1.50    Years: 45.00    Pack years:  67.50    Types: Cigarettes  . Smokeless tobacco: Never Used  Vaping Use  . Vaping Use: Former  Substance and Sexual Activity  . Alcohol use: No    Alcohol/week: 0.0 standard drinks  . Drug use: No  . Sexual activity: Not on file  Other Topics Concern  . Not on file  Social History Narrative  . Not on file   Social Determinants of Health   Financial Resource Strain: Medium Risk  . Difficulty of Paying Living Expenses: Somewhat hard  Food Insecurity: Not on file  Transportation Needs: Not on file  Physical Activity: Not on file  Stress: Stress Concern Present  . Feeling of Stress : Very much  Social Connections: Not on file    Allergies:  Allergies  Allergen Reactions  . Varenicline Other (See Comments)    "I got really depressed" "I got really depressed" CHANTEX  . Jardiance [Empagliflozin] Other (See Comments)    Yeast infection  . Methylprednisolone Nausea Only and Nausea And Vomiting    Metabolic Disorder Labs: Lab Results  Component Value Date   HGBA1C 6.2 07/06/2020   No results found for: PROLACTIN Lab Results  Component  Value Date   CHOL 118 07/06/2020   TRIG 166.0 (H) 07/06/2020   HDL 32.50 (L) 07/06/2020   CHOLHDL 4 07/06/2020   VLDL 33.2 07/06/2020   LDLCALC 53 07/06/2020   LDLCALC 47 03/17/2020   Lab Results  Component Value Date   TSH 1.56 03/17/2020   TSH 1.42 12/02/2017    Therapeutic Level Labs: No results found for: LITHIUM No results found for: VALPROATE No components found for:  CBMZ  Current Medications: Current Outpatient Medications  Medication Sig Dispense Refill  . cholestyramine light (PREVALITE) 4 GM/DOSE powder Take by mouth.    Marland Kitchen ofloxacin (OCUFLOX) 0.3 % ophthalmic solution Apply to eye.    Marland Kitchen albuterol (VENTOLIN HFA) 108 (90 Base) MCG/ACT inhaler INHALE 2 PUFFS FOUR TIMES A DAY 8.5 g 11  . amLODipine (NORVASC) 5 MG tablet Take 1 tablet (5 mg total) by mouth daily. 90 tablet 1  . aspirin 81 MG tablet Take 81 mg by mouth daily.     Marland Kitchen atorvastatin (LIPITOR) 20 MG tablet TAKE 1 TABLET  EVERY EVENING. 90 tablet 0  . Blood Glucose Monitoring Suppl (TRUE METRIX METER) w/Device KIT     . Budeson-Glycopyrrol-Formoterol (BREZTRI AEROSPHERE) 160-9-4.8 MCG/ACT AERO Inhale 2 puffs into the lungs in the morning and at bedtime. 32.1 g 3  . CALCIUM PO Take 600 mg by mouth daily.     . clopidogrel (PLAVIX) 75 MG tablet TAKE 1 TABLET EVERY DAY 90 tablet 0  . cyclobenzaprine (FLEXERIL) 10 MG tablet TAKE 1 TABLET THREE TIMES DAILY AS NEEDED FOR MUSCLE SPASM(S) 270 tablet 1  . DULoxetine (CYMBALTA) 60 MG capsule TAKE 1 CAPSULE (60 MG TOTAL) BY MOUTH DAILY. 90 capsule 1  . famotidine (PEPCID) 40 MG tablet TAKE 1 TABLET EVERY DAY 90 tablet 1  . glucose blood test strip Use as instructed to check blood sugars twice daily. Dx E11.9. 100 each 12  . isosorbide mononitrate (IMDUR) 30 MG 24 hr tablet Take 1 tablet (30 mg total) by mouth daily. 30 tablet 12  . ketorolac (ACULAR) 0.5 % ophthalmic solution Place 1 drop into the right eye 4 (four) times daily.    . L-FORMULA LYSINE HCL PO Take by mouth.     . lamoTRIgine (LAMICTAL) 25 MG tablet Take 1 tablet (25 mg total) by mouth daily.  FOR MOOD 90 tablet 0  . Loperamide HCl (IMODIUM A-D PO) Take 2 tablets by mouth daily as needed.     . metFORMIN (GLUCOPHAGE-XR) 500 MG 24 hr tablet Take 2 tablets (1,000 mg total) by mouth in the morning and at bedtime. 360 tablet 1  . Multiple Vitamin (MULTIVITAMIN PO) Take 1 tablet by mouth daily.    . mupirocin ointment (BACTROBAN) 2 % Apply to affected area on abdomen bid (Patient not taking: Reported on 01/12/2021) 22 g 0  . nystatin (MYCOSTATIN/NYSTOP) powder APPLY TO THE AFFECTED AREA(S) TWICE DAILY 15 g 0  . nystatin cream (MYCOSTATIN) APPLY ONE APPLICATION TOPICALLY 2 TIMES A DAY (Patient not taking: Reported on 01/12/2021) 30 g 0  . ofloxacin (OCUFLOX) 0.3 % ophthalmic solution Place 1 drop into the left eye 4 (four) times daily.    . pantoprazole (PROTONIX) 40 MG tablet TAKE 1 TABLET TWICE DAILY BEFORE MEALS 180 tablet 1  . Potassium 99 MG TABS Take by mouth.    . prednisoLONE acetate (PRED FORTE) 1 % ophthalmic suspension Place 1 drop into the right eye 4 (four) times daily.    . propranolol (INDERAL) 40 MG tablet 1 tab daily PO (Patient taking differently: Take 40 mg by mouth 2 (two) times daily. 1 tab daily PO) 180 tablet 1  . telmisartan (MICARDIS) 80 MG tablet Take 1 tablet (80 mg total) by mouth daily. 90 tablet 1  . tolterodine (DETROL LA) 4 MG 24 hr capsule Take 1 capsule (4 mg total) by mouth daily. 30 capsule 6  . traZODone (DESYREL) 100 MG tablet TAKE 2 TABLETS AT BEDTIME 180 tablet 1  . TRUEplus Lancets 33G MISC      No current facility-administered medications for this visit.     Musculoskeletal: Strength & Muscle Tone: UTA Gait & Station: UTA Patient leans: N/A  Psychiatric Specialty Exam: Review of Systems  Eyes:       Status post left eye surgery-healing  Musculoskeletal: Positive for back pain.  Psychiatric/Behavioral: Positive for sleep disturbance. The patient is  nervous/anxious.   All other systems reviewed and are negative.   There were no vitals taken for this visit.There is no height or weight on file to calculate BMI.  General Appearance: Casual  Eye Contact:  Fair  Speech:  Clear and Coherent  Volume:  Normal  Mood:  Anxious improving  Affect:  Congruent  Thought Process:  Goal Directed and Descriptions of Associations: Intact  Orientation:  Full (Time, Place, and Person)  Thought Content: Logical   Suicidal Thoughts:  No  Homicidal Thoughts:  No  Memory:  Immediate;   Fair Recent;   Fair Remote;   Fair  Judgement:  Fair  Insight:  Fair  Psychomotor Activity:  Normal  Concentration:  Concentration: Fair and Attention Span: Fair  Recall:  [employer] of Knowledge: Fair  Language: Fair  Akathisia:  No  Handed:  Right  AIMS (if indicated): UTA  Assets:  Communication Skills Desire for Improvement Housing Social Support  ADL's:  Intact  Cognition: WNL  Sleep:  Restless due to pain   Screenings: PHQ2-9   Flowsheet Row Video Visit from 01/31/2021 in [facility] Video Visit from 01/04/2021 in [facility] Office Visit from 07/20/2020 in [facility] Office Visit from 05/07/2019 in [facility] Office Visit from 08/14/2017 in [facility]  PHQ-2 Total Score 1 1 0 2 5  PHQ-9 Total Score 5 -- 3  2 [place] Video Visit from 01/31/2021 in [facility] Video Visit from 01/04/2021 in [place] [facility] Admission (Discharged) from 11/09/2018 in [facility] ENDOSCOPY  C-SSRS RISK CATEGORY Low Risk No Risk No Risk       Assessment and Plan: Kathryn Friedman is a 65 year old Caucasian female who has a history of MDD, anxiety disorder, sleep problems, multiple medical problems was evaluated by telemedicine today.  Patient with psychosocial stressors  of the pandemic, recent surgery, relationship struggles with her husband is currently making progress.  Plan as noted below.  Plan MDD in remission Cymbalta 60 mg p.o. daily Lamictal 25 mg p.o. daily Continue CBT  Other specified anxiety disorder-generalized anxiety not occurring more days than not-improving Continue CBT Cymbalta 60 mg p.o. daily  Insomnia-due to pain Trazodone 100 to 150 mg p.o. nightly Continue melatonin 5 to 10 mg p.o. nightly as needed Continue CPAP  Tobacco use disorder-improving Provided counseling.  Follow-up in clinic in the office in 6 to 8 weeks  This note was generated in part or whole with voice recognition software. Voice recognition is usually quite accurate but there are transcription errors that can and very often do occur. I apologize for any typographical errors that were not detected and corrected.      Ursula Alert, MD 01/31/2021, 6:40 PM

## 2021-02-05 ENCOUNTER — Ambulatory Visit: Admission: RE | Admit: 2021-02-05 | Payer: Medicare HMO | Source: Ambulatory Visit

## 2021-02-07 DIAGNOSIS — H2512 Age-related nuclear cataract, left eye: Secondary | ICD-10-CM | POA: Diagnosis not present

## 2021-02-07 DIAGNOSIS — G4733 Obstructive sleep apnea (adult) (pediatric): Secondary | ICD-10-CM | POA: Diagnosis not present

## 2021-02-09 DIAGNOSIS — G4733 Obstructive sleep apnea (adult) (pediatric): Secondary | ICD-10-CM | POA: Diagnosis not present

## 2021-02-12 ENCOUNTER — Other Ambulatory Visit: Payer: Self-pay

## 2021-02-12 MED ORDER — TOLTERODINE TARTRATE ER 4 MG PO CP24: 4.0000 mg | ORAL_CAPSULE | Freq: Every day | ORAL | 0 refills | Status: DC

## 2021-02-12 NOTE — Telephone Encounter (Signed)
Patient left a voicemail on triage line asking for script to be sent to [facility] mail order pharmacy, she is able to receive medication with a $0 copay. A one time 90 day script was sent. Patient's last visit was in May 2021. Patient will need a follow up in May of this year for additional refills. Called patient to make an appointment. Left a message to call back

## 2021-02-16 ENCOUNTER — Encounter: Payer: Self-pay | Admitting: Licensed Clinical Social Worker

## 2021-02-16 ENCOUNTER — Ambulatory Visit (INDEPENDENT_AMBULATORY_CARE_PROVIDER_SITE_OTHER): Payer: Medicare HMO | Admitting: Licensed Clinical Social Worker

## 2021-02-16 ENCOUNTER — Other Ambulatory Visit: Payer: Self-pay

## 2021-02-16 DIAGNOSIS — F17200 Nicotine dependence, unspecified, uncomplicated: Secondary | ICD-10-CM

## 2021-02-16 DIAGNOSIS — G4701 Insomnia due to medical condition: Secondary | ICD-10-CM

## 2021-02-16 DIAGNOSIS — F330 Major depressive disorder, recurrent, mild: Secondary | ICD-10-CM

## 2021-02-16 DIAGNOSIS — F419 Anxiety disorder, unspecified: Secondary | ICD-10-CM

## 2021-02-16 DIAGNOSIS — F4310 Post-traumatic stress disorder, unspecified: Secondary | ICD-10-CM | POA: Diagnosis not present

## 2021-02-16 NOTE — Progress Notes (Signed)
Virtual Visit via Telephone Note  I connected with Kathryn Friedman on 02/16/21 at 10:00 AM EDT by telephone and verified that I am speaking with the correct person using two identifiers.  Participating Parties Patient Provider  Location: Patient: Home Provider: Home Office   I discussed the limitations, risks, security and privacy concerns of performing an evaluation and management service by telephone and the availability of in person appointments. I also discussed with the patient that there may be a patient responsible charge related to this service. The patient expressed understanding and agreed to proceed.  THERAPY PROGRESS NOTE  Session Time: 33 Minutes  Participation Level: Active  Behavioral Response: AlertEuthymic  Type of Therapy: Individual Therapy  Treatment Goals addressed: Coping  Interventions: CBT  Summary: Kathryn Friedman is a 65 y.o. female who presents with depression and anxiety sxs. Pt reported she continues to struggle getting to sleep, often does not feel tired until late at night and wakes up several times despite continued use of medication. Pt reported continued pain in her knee despite injection and taking anti-inflammatory for the last 2 weeks. Pt reported doctor asked her to follow up once finished with taking the medication. Pt explained that she has had 10 different doctor appointments this month and 3 more to go. Pt reported she continues to enjoy her relationship with friend/neighbor several times per week. Pt reflected on difficult times as a mother and times she enjoyed.  Suicidal/Homicidal: No  Therapist Response: Therapist met with patient for follow up. Therapist and patient reviewed sxs and attempts to cope. Therapist provided psychoeducation around sleep hygiene. Pt was receptive. Therapist informed patient regarding initial phase of terminating services with this therapist due to leaving the practice in the next 5 weeks. Pt  denied any concerns around this and had no questions at this time.  Plan: Return again in 2 weeks.  Diagnosis: Axis I: Anxiety Disorder NOS, Post Traumatic Stress Disorder and MDD, Recurrent, Mild, Tobacco Use, Insomnia    Axis II: N/A  Josephine Igo, LCSW, LCAS 02/16/2021

## 2021-02-19 ENCOUNTER — Other Ambulatory Visit: Payer: Self-pay

## 2021-02-19 ENCOUNTER — Ambulatory Visit
Admission: RE | Admit: 2021-02-19 | Discharge: 2021-02-19 | Disposition: A | Discharge status: Home / Self Care | Payer: Medicare HMO | Source: Ambulatory Visit | Attending: Oncology | Admitting: Oncology

## 2021-02-19 DIAGNOSIS — Z122 Encounter for screening for malignant neoplasm of respiratory organs: Secondary | ICD-10-CM | POA: Insufficient documentation

## 2021-02-19 DIAGNOSIS — F17200 Nicotine dependence, unspecified, uncomplicated: Secondary | ICD-10-CM | POA: Diagnosis not present

## 2021-02-19 DIAGNOSIS — Z87891 Personal history of nicotine dependence: Secondary | ICD-10-CM | POA: Insufficient documentation

## 2021-02-19 DIAGNOSIS — F17210 Nicotine dependence, cigarettes, uncomplicated: Secondary | ICD-10-CM | POA: Diagnosis not present

## 2021-02-21 ENCOUNTER — Encounter: Payer: Self-pay | Admitting: *Deleted

## 2021-02-22 ENCOUNTER — Ambulatory Visit (INDEPENDENT_AMBULATORY_CARE_PROVIDER_SITE_OTHER): Payer: Medicare HMO | Admitting: Pharmacist

## 2021-02-22 DIAGNOSIS — F320 Major depressive disorder, single episode, mild: Secondary | ICD-10-CM | POA: Diagnosis not present

## 2021-02-22 DIAGNOSIS — E1159 Type 2 diabetes mellitus with other circulatory complications: Secondary | ICD-10-CM | POA: Diagnosis not present

## 2021-02-22 DIAGNOSIS — I10 Essential (primary) hypertension: Secondary | ICD-10-CM

## 2021-02-22 DIAGNOSIS — J849 Interstitial pulmonary disease, unspecified: Secondary | ICD-10-CM

## 2021-02-22 DIAGNOSIS — E7800 Pure hypercholesterolemia, unspecified: Secondary | ICD-10-CM | POA: Diagnosis not present

## 2021-02-22 DIAGNOSIS — J449 Chronic obstructive pulmonary disease, unspecified: Secondary | ICD-10-CM

## 2021-02-22 DIAGNOSIS — F17200 Nicotine dependence, unspecified, uncomplicated: Secondary | ICD-10-CM

## 2021-02-22 DIAGNOSIS — R197 Diarrhea, unspecified: Secondary | ICD-10-CM

## 2021-02-22 DIAGNOSIS — I2510 Atherosclerotic heart disease of native coronary artery without angina pectoris: Secondary | ICD-10-CM

## 2021-02-22 DIAGNOSIS — F32A Depression, unspecified: Secondary | ICD-10-CM

## 2021-02-22 NOTE — Patient Instructions (Signed)
Visit Information  PATIENT GOALS: Goals Addressed              This Visit's Progress     Patient Stated   .  Medication Monitoring (pt-stated)        Patient Goals/Self-Care Activities . Over the next 90 days, patient will:  - take medications as prescribed collaborate with provider on medication access solutions           Patient verbalizes understanding of instructions provided today and agrees to view in [place].   Plan: Telephone follow up appointment with care management team member scheduled for:  ~ 8 weeks  [name], PharmD, [place], [place] Clinical Pharmacist [employer] [facility] [employer] [phone]

## 2021-02-22 NOTE — Chronic Care Management (AMB) (Signed)
Chronic Care Management Pharmacy Note  02/22/2021 Name:  Kathryn Friedman MRN:  [id number] DOB:  10/31/1956  Subjective: Kathryn Friedman is an 65 y.o. year old female who is a primary patient of Einar Pheasant, MD.  The CCM team was consulted for assistance with disease management and care coordination needs.    Engaged with patient by telephone for follow up visit in response to provider referral for pharmacy case management and/or care coordination services.   Consent to Services:  The patient was given information about Chronic Care Management services, agreed to services, and gave verbal consent prior to initiation of services.  Please see initial visit note for detailed documentation.   Patient Care Team: Einar Pheasant, MD as PCP - General (Internal Medicine) De Hollingshead, RPH-CPP (Pharmacist)  Recent office visits: None since our last call  Recent consult visits:  3/9 - [place], noted improvement, no changes made  Phone notes - Added celecoxib per ortho, cardio in agreement  3/25 - therapy w/ [facility] visits: None in previous 6 months  Objective:  Lab Results  Component Value Date   CREATININE 0.79 07/06/2020   CREATININE 0.71 03/17/2020   CREATININE 0.66 09/06/2019    Lab Results  Component Value Date   HGBA1C 6.2 07/06/2020   Last diabetic Eye exam:  Lab Results  Component Value Date/Time   HMDIABEYEEXA No Retinopathy 11/06/2020 12:00 AM    Last diabetic Foot exam:  Lab Results  Component Value Date/Time   HMDIABFOOTEX by Dr Cleda Mccreedy - podiatry 12/10/2018 12:00 AM        Component Value Date/Time   CHOL 118 07/06/2020 0824   TRIG 166.0 (H) 07/06/2020 0824   HDL 32.50 (L) 07/06/2020 0824   CHOLHDL 4 07/06/2020 0824   VLDL 33.2 07/06/2020 0824   LDLCALC 53 07/06/2020 0824    Hepatic Function Latest Ref Rng & Units 07/06/2020 03/17/2020 09/06/2019  Total Protein 6.0 - 8.3 g/dL 6.7 6.6 6.1  Albumin 3.5 - 5.2  g/dL 4.0 3.7 3.6  AST 0 - 37 U/L $Remo'14 16 18  'ZdcLw$ ALT 0 - 35 U/L $Remo'16 15 14  'sNAsy$ Alk Phosphatase 39 - 117 U/L 64 70 64  Total Bilirubin 0.2 - 1.2 mg/dL 0.4 0.3 0.4  Bilirubin, Direct 0.0 - 0.3 mg/dL 0.1 0.1 0.1    Lab Results  Component Value Date/Time   TSH 1.56 03/17/2020 09:01 AM   TSH 1.42 12/02/2017 09:32 AM    CBC Latest Ref Rng & Units 09/06/2019 04/28/2019 12/24/2018  WBC 4.0 - 10.5 K/uL 8.5 8.6 13.0(H)  Hemoglobin 12.0 - 15.0 g/dL 14.9 13.9 13.6  Hematocrit 36.0 - 46.0 % 44.5 40.4 41.6  Platelets 150.0 - 400.0 K/uL 174.0 181.0 195   Clinical ASCVD: Yes    Social History   Tobacco Use  Smoking Status Current Every Day Smoker  . Packs/day: 1.50  . Years: 45.00  . Pack years: 67.50  . Types: Cigarettes  Smokeless Tobacco Never Used   BP Readings from Last 3 Encounters:  12/19/20 133/84  09/14/20 (!) 156/89  07/20/20 128/74   Pulse Readings from Last 3 Encounters:  12/19/20 77  09/14/20 81  07/20/20 64   Wt Readings from Last 3 Encounters:  02/19/21 221 lb (100.2 kg)  12/26/20 221 lb (100.2 kg)  12/19/20 210 lb (95.3 kg)    Assessment: Review of patient past medical history, allergies, medications, health status, including review of consultants reports, laboratory and other test data, was performed as  part of comprehensive evaluation and provision of chronic care management services.   SDOH:  (Social Determinants of Health) assessments and interventions performed:  SDOH Interventions   Flowsheet Row Most Recent Value  SDOH Interventions   Financial Strain Interventions Other (Comment)  [manufacturer assistance]      CCM Care Plan  Allergies  Allergen Reactions  . Varenicline Other (See Comments)    "I got really depressed" "I got really depressed" CHANTEX  . Jardiance [Empagliflozin] Other (See Comments)    Yeast infection  . Methylprednisolone Nausea Only and Nausea And Vomiting    Medications Reviewed Today    Reviewed by Josephine Igo, LCSW (Social  Worker) on 02/16/21 at 1135  Med List Status: <None>  Medication Order Taking? Sig Documenting Provider Last Dose Status Informant  albuterol (VENTOLIN HFA) 108 (90 Base) MCG/ACT inhaler [id number] No INHALE 2 PUFFS FOUR TIMES A DAY Einar Pheasant, MD Taking Active   amLODipine (NORVASC) 5 MG tablet [id number] No Take 1 tablet (5 mg total) by mouth daily. Einar Pheasant, MD Taking Active   aspirin 81 MG tablet [id number] No Take 81 mg by mouth daily.  [provider] Taking Active Self  atorvastatin (LIPITOR) 20 MG tablet [id number]  TAKE 1 TABLET  EVERY EVENING. Einar Pheasant, MD  Active   Blood Glucose Monitoring Suppl (TRUE METRIX METER) w/Device Drucie Opitz [id number] No  [provider] Taking Active   Budeson-Glycopyrrol-Formoterol (BREZTRI AEROSPHERE) 160-9-4.8 MCG/ACT AERO [id number] No Inhale 2 puffs into the lungs in the morning and at bedtime. Einar Pheasant, MD Taking Active   CALCIUM PO [id number] No Take 600 mg by mouth daily.  [provider] Taking Active Self  cholestyramine light (PREVALITE) 4 GM/DOSE powder [id number]  Take by mouth. [provider]  Active   clopidogrel (PLAVIX) 75 MG tablet [id number]  TAKE 1 TABLET EVERY DAY Einar Pheasant, MD  Active   cyclobenzaprine (FLEXERIL) 10 MG tablet [id number] No TAKE 1 TABLET THREE TIMES DAILY AS NEEDED FOR MUSCLE SPASM(S) Einar Pheasant, MD Taking Active            Med Note Darnelle Maffucci, Arville Lime   Fri Jan 12, 2021  9:11 AM) Taking 2-3 times daily  DULoxetine (CYMBALTA) 60 MG capsule [id number] No TAKE 1 CAPSULE (60 MG TOTAL) BY MOUTH DAILY. Einar Pheasant, MD Taking Active   famotidine (PEPCID) 40 MG tablet [id number] No TAKE 1 TABLET EVERY DAY Einar Pheasant, MD Taking Active   glucose blood test strip [id number] No Use as instructed to check blood sugars twice daily. Dx E11.9. Einar Pheasant, MD Taking Active   isosorbide mononitrate (IMDUR) 30 MG 24 hr tablet [id number] No Take 1 tablet (30 mg total) by  mouth daily. Einar Pheasant, MD Taking Active   ketorolac (ACULAR) 0.5 % ophthalmic solution [id number] No Place 1 drop into the right eye 4 (four) times daily. [provider] Taking Active   L-FORMULA LYSINE HCL PO [id number] No Take by mouth. [provider] Taking Active   lamoTRIgine (LAMICTAL) 25 MG tablet [id number] No Take 1 tablet (25 mg total) by mouth daily. FOR Cyndra Numbers, MD Taking Active   Loperamide HCl (IMODIUM A-D PO) [id number] No Take 2 tablets by mouth daily as needed.  [provider] Taking Active Self  metFORMIN (GLUCOPHAGE-XR) 500 MG 24 hr tablet [id number] No Take 2 tablets (1,000 mg total) by mouth in the morning and at bedtime. Einar Pheasant, MD Taking Active   Multiple Vitamin (MULTIVITAMIN PO) [id number] No Take 1 tablet  by mouth daily. [provider] Taking Active Self  mupirocin ointment (BACTROBAN) 2 % [id number] No Apply to affected area on abdomen bid  Patient not taking: Reported on 01/12/2021   Einar Pheasant, MD Not Taking Active   nystatin (MYCOSTATIN/NYSTOP) powder [id number] No APPLY TO THE AFFECTED AREA(S) TWICE DAILY Einar Pheasant, MD Taking Active   nystatin cream (MYCOSTATIN) [id number] No APPLY ONE APPLICATION TOPICALLY 2 TIMES A DAY  Patient not taking: Reported on 01/12/2021   Einar Pheasant, MD Not Taking Active   ofloxacin (OCUFLOX) 0.3 % ophthalmic solution [id number]  Apply to eye. [provider]  Active   ofloxacin (OCUFLOX) 0.3 % ophthalmic solution [id number]  Place 1 drop into the left eye 4 (four) times daily. [provider]  Active   pantoprazole (PROTONIX) 40 MG tablet [id number] No TAKE 1 TABLET TWICE DAILY BEFORE MEALS Einar Pheasant, MD Taking Active   Potassium 99 MG TABS [id number] No Take by mouth. [provider] Taking Active   prednisoLONE acetate (PRED FORTE) 1 % ophthalmic suspension [id number] No Place 1 drop into the right eye 4 (four) times daily. [provider] Taking Active   propranolol (INDERAL) 40 MG tablet [id number] No 1 tab daily PO  Patient taking differently: Take 40 mg by mouth 2 (two) times daily. 1 tab daily PO   Einar Pheasant, MD Taking Active   telmisartan (MICARDIS) 80 MG tablet [id number] No Take 1 tablet (80 mg total) by mouth daily. Einar Pheasant, MD Taking Active   tolterodine (DETROL LA) 4 MG 24 hr capsule [id number]  Take 1 capsule (4 mg total) by mouth daily. Abbie Sons, MD  Active   traZODone (DESYREL) 100 MG tablet [id number]  TAKE 2 TABLETS AT BEDTIME Einar Pheasant, MD  Active   TRUEplus Lancets 33G MISC [id number] No  [provider] Taking Active           Patient Active Problem List   Diagnosis Date Noted  . SOBOE (shortness of breath on exertion) 01/16/2021  . Thrush 12/27/2020  . Suspected COVID-19 virus infection 12/20/2020  . MDD (major depressive disorder), recurrent episode, mild ([place]) 11/16/2020  . Abdominal wall abscess 07/30/2020  . Skin lesion 07/23/2020  . Boil 07/20/2020  . MDD (major depressive disorder), recurrent, in full remission ([place]) 06/26/2020  . Insomnia due to medical condition 06/26/2020  . Anxiety disorder 06/26/2020  . Cough 04/03/2020  . Urge incontinence 03/02/2020  . Urinary frequency 02/06/2020  . Bilateral carotid artery stenosis 10/15/2019  . PSVT (paroxysmal supraventricular tachycardia) ([place]) 08/09/2019  . History of migraine headaches 05/10/2019  . Lower abdominal pain 03/21/2019  . Dysphagia 09/15/2018  . ILD (interstitial lung disease) ([place]) 08/17/2017  . Tobacco use disorder 08/11/2016  . Venous insufficiency of both lower extremities 07/10/2016  . Healthcare maintenance 06/18/2016  . Lump in the abdomen 08/06/2015  . Dizziness 08/06/2015  . Fatty infiltration of liver 08/11/2014  . Blood in the stool 08/11/2014  . Acute diarrhea 08/11/2014  . Hemorrhage of gastrointestinal tract 08/07/2014  . GERD (gastroesophageal reflux disease)  10/20/2013  . Iron deficiency anemia 10/20/2013  . Nausea with vomiting 10/19/2013  . LUQ pain 10/19/2013  . Diarrhea 09/26/2013  . Anemia 04/12/2013  . CAD (coronary artery disease) 02/18/2013  . COPD (chronic obstructive pulmonary disease) ([place]) 02/18/2013  . Obstructive sleep apnea 02/18/2013  . Mild depression ([place]) 02/18/2013  . Diverticulosis 02/18/2013  . Diabetes ([place]) 02/17/2013  . Essential hypertension, benign 02/17/2013  . Hypercholesterolemia 02/17/2013  Immunization History  Administered Date(s) Administered  . Influenza,inj,Quad PF,6+ Mos 09/23/2013, 09/09/2014, 08/01/2015, 08/06/2016, 08/14/2017, 08/14/2018, 09/16/2019, 07/13/2020  . Influenza-Unspecified 10/24/2011  . PFIZER(Purple Top)SARS-COV-2 Vaccination 02/23/2020, 03/15/2020  . Pneumococcal Polysaccharide-23 09/23/2013  . Td 04/21/2018    Conditions to be addressed/monitored: CAD, HTN, HLD, COPD and DMII  Care Plan : General Pharmacy (Adult)  Updates made by De Hollingshead, RPH-CPP since 02/22/2021 12:00 AM    Problem: COPD, CAD, HLD, HTN     Long-Range Goal: Disease Progression Prevention   This Visit's Progress: On track  Recent Progress: On track  Priority: High  Note:   Current Barriers:  . Unable to independently afford treatment regimen . Complex patient with multiple comorbidities including diabetes, coronary artery disease, COPD, depression/anxiety  Pharmacist Clinical Goal(s):  Marland Kitchen Over the next 90 days, patient will verbalize ability to afford treatment regimen. . Over the next 90 days, patient will adhere to prescribed medication regimen  Interventions: . 1:1 collaboration with Einar Pheasant, MD regarding development and update of comprehensive plan of care as evidenced by provider attestation and co-signature . Inter-disciplinary care team collaboration (see longitudinal plan of care) . Comprehensive medication review performed; medication list updated in electronic medical  record  Diabetes: . Controlled; current treatment: metformin XR 1000 mg BID . Current meal patterns: breakfast: cereal, fried ham/sausage on bread or biscuit; skips lunch; supper: fish sandwich w/ slaw, onion rings, peach cobbler w/ ice cream; sometimes cooks; generally goes out to eat; country grill - spaghetti  . Current exercise: unable to do d/t hip pain . Educated on Plate Method. Sending handout. Discussed minimizing carbohydrate portion sizes.  . Discussed use of GLP1 to continue to control diabetes and assist in weight loss efforts. Discussed Ozempic. Will discuss w/ Dr. Nicki Reaper at upcoming appointment. . Continue current regimen at this time.   Hypertension, Coronary Artery Disease . Appropriately managed; current treatment: amlodipine 5 mg daily, isosorbide mononitrate 30 mg QAM, propranolol 40 mg BID (also for headaches), telmisartan 80 mg daily;  . Scheduled for f/u with cardiology next month. ECHO and stress test next month w/ Dr. Nehemiah Massed . Home BP readings: reports elevated readings- 180s/100s. Patient has discussed this with cardiology. She has an arm cuff but has never brought to office for comparison. Encouraged to take cuff to cardiology appt next week.  . Recommended to continue current treatment regimen along with collaboration with cardiology    Hyperlipidemia, ASCVD Friedman Reduction . Controlled per last lipid panel; current treatment: atorvastatin 20 mg daily . Antiplatelet therapy: clopidogrel 75 mg daily, aspirin 81 mg daily  . Asks what aortic atherosclerosis is. Reviewed.  . Recommended to continue current treatment regimen  Chronic Obstructive Pulmonary Disease: . Controlled; current treatment: Breztri 160/9/4.8 mcg 2 puffs BID; albuterol HFA PRN - 1-2 times daily due to wheezing and SOB; follows w/ Dr. Raul Del o Albuterol cost with [facility] at [name] is ~$19 per inhaler, much better than $45/inhaler that she pays with her insurance  . Continue current regimen  at this time. She will schedule f/u with Dr. Raul Del.  Depression/Anxiety/Insomnia/Pain: . Moderately well controlled, reports improvement; follows w/ Dr. Shea Evans. Current regimen: lamotrigine 25 mg daily; duloxetine 60 mg daily, trazodone 100-150 mg QPM;  . Sad that Neta Mends is changing jobs. Worried about whether she will connect as well with next therapist. Encouraged to communicate well and let them know if she doesn't feel a good connection.  . Encouraged continued collaboration w/ psychiatry and LCSW.   Pain: .  Uncontrolled; current treatment: celecoxib 100 mg BID,  cyclobenzaprine 5 mg up to TID for pain/muscle spasms. Met with Dr. Tamala Julian orthopedist. Joint injection to hip did not provide benefit. Denies benefit since starting celecoxib ~ 2 weeks ago.  . Encouraged to continue current regimen, but outreach orthopedists if celecoxib benefit is not seen after ~ 1 month.   Diarrhea, chronic with GERD: . Worsened d/t medication access. Colestipol was unavailable to be orderede, so GI changed her to cholestyramine 4 g packet BID, but was too expensive. She has not reached back out to [employer] to review if colestipol is able to be ordered; taking imodium daily; pantoprazole 40 mg BID with famotidine 40 mg daily.  . Encouraged to communicate with [name] regarding status of colestipol order.   Overactive Bladder: . Well managed; current regimen: tolterodine LA 4 mg, follows w/ Dr. Bernardo Heater . Recommended to continue current regimen and collaboration w/ urology  Tobacco Abuse: . 1-1.5 packs per day; 50 years of use . Previous quit attempts: unsuccessful using varenicline (nightmares) . Triggers to smoke: stress, familial stress lately  . Has nicotine 21 mg patches + nicotine 4 mg gum. Notes that since she only has ~1 box of patches, she doesn't want to start until she has a bigger supply. She is going to order nicotine patches next Quarter from her insurance. Encouraged to also call  [place] [facility] for additional supplies. She notes she will do this . Encouraged to continue to focus on other methods of stress reduction.   Patient Goals/Self-Care Activities . Over the next 90 days, patient will:  - take medications as prescribed collaborate with provider on medication access solutions  Follow Up Plan: Telephone follow up appointment with care management team member scheduled for:~  8 weeks      Medication Assistance: Breztri obtained through [facility] medication assistance program.  Enrollment ends 11/24/21  Patient's preferred pharmacy is:  [facility] - [place], [address] [place] [address] Phone: [phone] Fax: [place], [address] [place] [address] Phone: [phone] Fax: [phone]  Uses pill box? Yes Pt endorses 100% compliance  Follow Up:  Patient agrees to Care Plan and Follow-up.  Plan: Telephone follow up appointment with care management team member scheduled for:  ~ 8 weeks  [name], PharmD, [place], [place] Clinical Pharmacist [employer] at [employer] [phone]

## 2021-02-27 DIAGNOSIS — R0602 Shortness of breath: Secondary | ICD-10-CM | POA: Diagnosis not present

## 2021-02-28 ENCOUNTER — Ambulatory Visit: Payer: Medicare HMO | Admitting: Pharmacist

## 2021-02-28 DIAGNOSIS — F17200 Nicotine dependence, unspecified, uncomplicated: Secondary | ICD-10-CM

## 2021-02-28 DIAGNOSIS — I10 Essential (primary) hypertension: Secondary | ICD-10-CM

## 2021-02-28 DIAGNOSIS — I2510 Atherosclerotic heart disease of native coronary artery without angina pectoris: Secondary | ICD-10-CM

## 2021-02-28 DIAGNOSIS — R197 Diarrhea, unspecified: Secondary | ICD-10-CM

## 2021-02-28 DIAGNOSIS — J449 Chronic obstructive pulmonary disease, unspecified: Secondary | ICD-10-CM

## 2021-02-28 DIAGNOSIS — E7800 Pure hypercholesterolemia, unspecified: Secondary | ICD-10-CM

## 2021-02-28 DIAGNOSIS — E1159 Type 2 diabetes mellitus with other circulatory complications: Secondary | ICD-10-CM

## 2021-02-28 NOTE — Patient Instructions (Signed)
Visit Information  PATIENT GOALS: Goals Addressed              This Visit's Progress     Patient Stated   .  Medication Monitoring (pt-stated)        Patient Goals/Self-Care Activities . Over the next 90 days, patient will:  - take medications as prescribed collaborate with provider on medication access solutions            Patient verbalizes understanding of instructions provided today and agrees to view in [place].   Plan: Telephone follow up appointment with care management team member scheduled for:  ~ 8 weeks as previously scheduled  [name], PharmD, [place], [place] Clinical Pharmacist [employer] at [employer] [phone]

## 2021-02-28 NOTE — Chronic Care Management (AMB) (Signed)
Chronic Care Management Pharmacy Note  02/28/2021 Name:  Kathryn Friedman MRN:  [id number] DOB:  1956-10-03  Subjective: Kathryn Friedman is an 65 y.o. year old female who is a primary patient of Kathryn Pheasant, MD.  The CCM team was consulted for assistance with disease management and care coordination needs.    Engaged with patient by telephone for response to question about medication mangagement in response to provider referral for pharmacy case management and/or care coordination services.   Consent to Services:  The patient was given information about Chronic Care Management services, agreed to services, and gave verbal consent prior to initiation of services.  Please see initial visit note for detailed documentation.   Patient Care Team: Kathryn Pheasant, MD as PCP - General (Internal Medicine) De Hollingshead, RPH-CPP (Pharmacist)  Recent office visits: None since our last call  Recent consult visits: None since our last call  Hospital visits: None in previous 6 months  Objective:  Lab Results  Component Value Date   CREATININE 0.79 07/06/2020   CREATININE 0.71 03/17/2020   CREATININE 0.66 09/06/2019    Lab Results  Component Value Date   HGBA1C 6.2 07/06/2020   Last diabetic Eye exam:  Lab Results  Component Value Date/Time   HMDIABEYEEXA No Retinopathy 11/06/2020 12:00 AM    Last diabetic Foot exam:  Lab Results  Component Value Date/Time   HMDIABFOOTEX by Dr Cleda Mccreedy - podiatry 12/10/2018 12:00 AM        Component Value Date/Time   CHOL 118 07/06/2020 0824   TRIG 166.0 (H) 07/06/2020 0824   HDL 32.50 (L) 07/06/2020 0824   CHOLHDL 4 07/06/2020 0824   VLDL 33.2 07/06/2020 0824   LDLCALC 53 07/06/2020 0824    Hepatic Function Latest Ref Rng & Units 07/06/2020 03/17/2020 09/06/2019  Total Protein 6.0 - 8.3 g/dL 6.7 6.6 6.1  Albumin 3.5 - 5.2 g/dL 4.0 3.7 3.6  AST 0 - 37 U/L _0 ALT 0 - 35 U/L _1 Alk Phosphatase 39  - 117 U/L 64 70 64  Total Bilirubin 0.2 - 1.2 mg/dL 0.4 0.3 0.4  Bilirubin, Direct 0.0 - 0.3 mg/dL 0.1 0.1 0.1    Lab Results  Component Value Date/Time   TSH 1.56 03/17/2020 09:01 AM   TSH 1.42 12/02/2017 09:32 AM    CBC Latest Ref Rng & Units 09/06/2019 04/28/2019 12/24/2018  WBC 4.0 - 10.5 K/uL 8.5 8.6 13.0(H)  Hemoglobin 12.0 - 15.0 g/dL 14.9 13.9 13.6  Hematocrit 36.0 - 46.0 % 44.5 40.4 41.6  Platelets 150.0 - 400.0 K/uL 174.0 181.0 195   Clinical ASCVD: No    Social History   Tobacco Use  Smoking Status Current Every Day Smoker  . Packs/day: 1.50  . Years: 45.00  . Pack years: 67.50  . Types: Cigarettes  Smokeless Tobacco Never Used   BP Readings from Last 3 Encounters:  12/19/20 133/84  09/14/20 (!) 156/89  07/20/20 128/74   Pulse Readings from Last 3 Encounters:  12/19/20 77  09/14/20 81  07/20/20 64   Wt Readings from Last 3 Encounters:  02/19/21 221 lb (100.2 kg)  12/26/20 221 lb (100.2 kg)  12/19/20 210 lb (95.3 kg)    Assessment: Review of patient past medical history, allergies, medications, health status, including review of consultants reports, laboratory and other test data, was performed as part of comprehensive evaluation and provision of chronic care management services.   SDOH:  (Social Determinants of Health)  assessments and interventions performed:    CCM Care Plan  Allergies  Allergen Reactions  . Varenicline Other (See Comments)    "I got really depressed" "I got really depressed" CHANTEX  . Jardiance [Empagliflozin] Other (See Comments)    Yeast infection  . Methylprednisolone Nausea Only and Nausea And Vomiting    Medications Reviewed Today    Reviewed by Josephine Igo, LCSW (Social Worker) on 02/16/21 at 1135  Med List Status: <None>  Medication Order Taking? Sig Documenting Provider Last Dose Status Informant  albuterol (VENTOLIN HFA) 108 (90 Base) MCG/ACT inhaler [id number] No INHALE 2 PUFFS FOUR TIMES A DAY Kathryn Pheasant, MD Taking Active   amLODipine (NORVASC) 5 MG tablet [id number] No Take 1 tablet (5 mg total) by mouth daily. Kathryn Pheasant, MD Taking Active   aspirin 81 MG tablet [id number] No Take 81 mg by mouth daily.  [provider] Taking Active Self  atorvastatin (LIPITOR) 20 MG tablet [id number]  TAKE 1 TABLET  EVERY EVENING. Kathryn Pheasant, MD  Active   Blood Glucose Monitoring Suppl (TRUE METRIX METER) w/Device Drucie Opitz [id number] No  [provider] Taking Active   Budeson-Glycopyrrol-Formoterol (BREZTRI AEROSPHERE) 160-9-4.8 MCG/ACT AERO [id number] No Inhale 2 puffs into the lungs in the morning and at bedtime. Kathryn Pheasant, MD Taking Active   CALCIUM PO [id number] No Take 600 mg by mouth daily.  [provider] Taking Active Self  cholestyramine light (PREVALITE) 4 GM/DOSE powder [id number]  Take by mouth. [provider]  Active   clopidogrel (PLAVIX) 75 MG tablet [id number]  TAKE 1 TABLET EVERY DAY Kathryn Pheasant, MD  Active   cyclobenzaprine (FLEXERIL) 10 MG tablet [id number] No TAKE 1 TABLET THREE TIMES DAILY AS NEEDED FOR MUSCLE SPASM(S) Kathryn Pheasant, MD Taking Active            Med Note Darnelle Maffucci, Arville Lime   Fri Jan 12, 2021  9:11 AM) Taking 2-3 times daily  DULoxetine (CYMBALTA) 60 MG capsule [id number] No TAKE 1 CAPSULE (60 MG TOTAL) BY MOUTH DAILY. Kathryn Pheasant, MD Taking Active   famotidine (PEPCID) 40 MG tablet [id number] No TAKE 1 TABLET EVERY DAY Kathryn Pheasant, MD Taking Active   glucose blood test strip [id number] No Use as instructed to check blood sugars twice daily. Dx E11.9. Kathryn Pheasant, MD Taking Active   isosorbide mononitrate (IMDUR) 30 MG 24 hr tablet [id number] No Take 1 tablet (30 mg total) by mouth daily. Kathryn Pheasant, MD Taking Active   ketorolac (ACULAR) 0.5 % ophthalmic solution [id number] No Place 1 drop into the right eye 4 (four) times daily. [provider] Taking Active   L-FORMULA LYSINE HCL PO [id number] No Take  by mouth. [provider] Taking Active   lamoTRIgine (LAMICTAL) 25 MG tablet [id number] No Take 1 tablet (25 mg total) by mouth daily. FOR Kathryn Numbers, MD Taking Active   Loperamide HCl (IMODIUM A-D PO) [id number] No Take 2 tablets by mouth daily as needed.  [provider] Taking Active Self  metFORMIN (GLUCOPHAGE-XR) 500 MG 24 hr tablet [id number] No Take 2 tablets (1,000 mg total) by mouth in the morning and at bedtime. Kathryn Pheasant, MD Taking Active   Multiple Vitamin (MULTIVITAMIN PO) [id number] No Take 1 tablet by mouth daily. [provider] Taking Active Self  mupirocin ointment (BACTROBAN) 2 % [id number] No Apply to affected area on abdomen bid  Patient not taking: Reported on 01/12/2021   Kathryn Pheasant, MD Not Taking Active   nystatin (MYCOSTATIN/NYSTOP) powder  [id number] No APPLY TO THE AFFECTED AREA(S) TWICE DAILY Kathryn Pheasant, MD Taking Active   nystatin cream (MYCOSTATIN) [id number] No APPLY ONE APPLICATION TOPICALLY 2 TIMES A DAY  Patient not taking: Reported on 01/12/2021   Kathryn Pheasant, MD Not Taking Active   ofloxacin (OCUFLOX) 0.3 % ophthalmic solution [id number]  Apply to eye. [provider]  Active   ofloxacin (OCUFLOX) 0.3 % ophthalmic solution [id number]  Place 1 drop into the left eye 4 (four) times daily. [provider]  Active   pantoprazole (PROTONIX) 40 MG tablet [id number] No TAKE 1 TABLET TWICE DAILY BEFORE MEALS Kathryn Pheasant, MD Taking Active   Potassium 99 MG TABS [id number] No Take by mouth. [provider] Taking Active   prednisoLONE acetate (PRED FORTE) 1 % ophthalmic suspension [id number] No Place 1 drop into the right eye 4 (four) times daily. [provider] Taking Active   propranolol (INDERAL) 40 MG tablet [id number] No 1 tab daily PO  Patient taking differently: Take 40 mg by mouth 2 (two) times daily. 1 tab daily PO   Kathryn Pheasant, MD Taking Active   telmisartan (MICARDIS) 80 MG  tablet [id number] No Take 1 tablet (80 mg total) by mouth daily. Kathryn Pheasant, MD Taking Active   tolterodine (DETROL LA) 4 MG 24 hr capsule [id number]  Take 1 capsule (4 mg total) by mouth daily. Abbie Sons, MD  Active   traZODone (DESYREL) 100 MG tablet [id number]  TAKE 2 TABLETS AT BEDTIME Kathryn Pheasant, MD  Active   TRUEplus Lancets 33G MISC [id number] No  [provider] Taking Active           Patient Active Problem List   Diagnosis Date Noted  . SOBOE (shortness of breath on exertion) 01/16/2021  . Thrush 12/27/2020  . Suspected COVID-19 virus infection 12/20/2020  . MDD (major depressive disorder), recurrent episode, mild ([place]) 11/16/2020  . Abdominal wall abscess 07/30/2020  . Skin lesion 07/23/2020  . Boil 07/20/2020  . MDD (major depressive disorder), recurrent, in full remission ([place]) 06/26/2020  . Insomnia due to medical condition 06/26/2020  . Anxiety disorder 06/26/2020  . Cough 04/03/2020  . Urge incontinence 03/02/2020  . Urinary frequency 02/06/2020  . Bilateral carotid artery stenosis 10/15/2019  . PSVT (paroxysmal supraventricular tachycardia) ([place]) 08/09/2019  . History of migraine headaches 05/10/2019  . Lower abdominal pain 03/21/2019  . Dysphagia 09/15/2018  . ILD (interstitial lung disease) ([place]) 08/17/2017  . Tobacco use disorder 08/11/2016  . Venous insufficiency of both lower extremities 07/10/2016  . Healthcare maintenance 06/18/2016  . Lump in the abdomen 08/06/2015  . Dizziness 08/06/2015  . Fatty infiltration of liver 08/11/2014  . Blood in the stool 08/11/2014  . Acute diarrhea 08/11/2014  . Hemorrhage of gastrointestinal tract 08/07/2014  . GERD (gastroesophageal reflux disease) 10/20/2013  . Iron deficiency anemia 10/20/2013  . Nausea with vomiting 10/19/2013  . LUQ pain 10/19/2013  . Diarrhea 09/26/2013  . Anemia 04/12/2013  . CAD (coronary artery disease) 02/18/2013  . COPD (chronic obstructive pulmonary disease) ([place])  02/18/2013  . Obstructive sleep apnea 02/18/2013  . Mild depression ([place]) 02/18/2013  . Diverticulosis 02/18/2013  . Diabetes ([place]) 02/17/2013  . Essential hypertension, benign 02/17/2013  . Hypercholesterolemia 02/17/2013    Immunization History  Administered Date(s) Administered  . Influenza,inj,Quad PF,6+ Mos 09/23/2013, 09/09/2014, 08/01/2015, 08/06/2016, 08/14/2017, 08/14/2018, 09/16/2019, 07/13/2020  . Influenza-Unspecified 10/24/2011  . PFIZER(Purple Top)SARS-COV-2 Vaccination 02/23/2020, 03/15/2020  . Pneumococcal Polysaccharide-23 09/23/2013  . Td 04/21/2018  Conditions to be addressed/monitored: CAD, HTN, HLD and COPD  Care Plan : General Pharmacy (Adult)  Updates made by De Hollingshead, RPH-CPP since 02/28/2021 12:00 AM    Problem: COPD, CAD, HLD, HTN     Long-Range Goal: Disease Progression Prevention   This Visit's Progress: On track  Recent Progress: On track  Priority: High  Note:   Current Barriers:  . Unable to independently afford treatment regimen . Complex patient with multiple comorbidities including diabetes, coronary artery disease, COPD, depression/anxiety  Pharmacist Clinical Goal(s):  Marland Kitchen Over the next 90 days, patient will verbalize ability to afford treatment regimen. . Over the next 90 days, patient will adhere to prescribed medication regimen  Interventions: . 1:1 collaboration with Kathryn Pheasant, MD regarding development and update of comprehensive plan of care as evidenced by provider attestation and co-signature . Inter-disciplinary care team collaboration (see longitudinal plan of care) . Comprehensive medication review performed; medication list updated in electronic medical record  Diabetes: . Controlled; current treatment: metformin XR 1000 mg BID . Previously recommended to continue current regimen at this time.   Hypertension, Coronary Artery Disease . Appropriately managed; current treatment: amlodipine 5 mg daily,  isosorbide mononitrate 30 mg QAM, propranolol 40 mg BID (also for headaches), telmisartan 80 mg daily;  . Previously recommended to continue current treatment regimen along with collaboration with cardiology   Hyperlipidemia, ASCVD Risk Reduction . Controlled per last lipid panel; current treatment: atorvastatin 20 mg daily . Antiplatelet therapy: clopidogrel 75 mg daily, aspirin 81 mg daily  . Previously recommended to continue current treatment regimen  Chronic Obstructive Pulmonary Disease: . Controlled; current treatment: Breztri 160/9/4.8 mcg 2 puffs BID; albuterol HFA PRN - 1-2 times daily due to wheezing and SOB; follows w/ Dr. Raul Del . Calls today to report that she developed a sore under her tongue. This is the second time this has happened since switching from Symbicort to [place]. Discussed that generally the inhaled steroid component is what can cause mouth sores, and Breztri and Symbicor have the same inhaled corticosteroid. She notes that she is rinsing her mouth after taking Breztri. Discussed that she has f/u with Dr. Raul Del next week. Encouraged to brush teeth/tongue after Breztri dosing and discuss w/ Dr. Raul Del if she continues to develop sores. If switching back to Symbicort, a script would need to be sent to [facility] and patient or provider would need to call the company to request the script be filled.   Depression/Anxiety/Insomnia/Pain: . Moderately well controlled, reports improvement; follows w/ Dr. Shea Evans. Current regimen: lamotrigine 25 mg daily; duloxetine 60 mg daily, trazodone 100-150 mg QPM;  . Sad that Neta Mends is changing jobs. Worried about whether she will connect as well with next therapist. Encouraged to communicate well and let them know if she doesn't feel a good connection.  . Previously recommended to continue current regimen along with collaboration w/ psychiatry and LCSW.   Pain: . Uncontrolled; current treatment: celecoxib 100 mg BID, cyclobenzaprine 5 mg  up to TID for pain/muscle spasms. Met with Dr. Tamala Julian orthopedist. Joint injection to hip did not provide benefit. Denies benefit since starting celecoxib ~ 2 weeks ago.  . Previously recommended to continue current regimen, but outreach orthopedists if celecoxib benefit is not seen after ~ 1 month.   Diarrhea, chronic with GERD: . Worsened d/t medication access. Colestipol was unavailable to be ordered, so GI changed her to cholestyramine 4 g packet BID, but was too expensive. Taking imodium daily; pantoprazole 40 mg BID with  famotidine 40 mg daily.  . Patient contacted [name] and they have colestipol in stock now. She will fill this when she is able.   Overactive Bladder: . Well managed; current regimen: tolterodine LA 4 mg, follows w/ Dr. Bernardo Heater . Recommended to continue current regimen and collaboration w/ urology  Tobacco Abuse: . 1-1.5 packs per day; 50 years of use . Previous quit attempts: unsuccessful using varenicline (nightmares) . Triggers to smoke: stress, familial stress lately  . Previously encouraged to start nicotine patches + gum when ready to quit.  Patient Goals/Self-Care Activities . Over the next 90 days, patient will:  - take medications as prescribed collaborate with provider on medication access solutions  Follow Up Plan: Telephone follow up appointment with care management team member scheduled for:~  8 weeks as previously scheduled      Medication Assistance: Breztri obtained through [facility] medication assistance program.  Enrollment ends 11/24/21  Patient's preferred pharmacy is:  [facility] - [place], [place] [address] Phone: [phone] Fax: [place], [place] [address] Phone: [phone] Fax: [phone]   Follow Up:  Patient agrees to Care Plan and Follow-up.  Plan:  Telephone follow up appointment with care management team member scheduled for:  ~ 8 weeks as previously scheduled  [name], PharmD, [place], [place] Clinical Pharmacist [employer] [facility] [employer] [phone]

## 2021-03-01 ENCOUNTER — Ambulatory Visit (INDEPENDENT_AMBULATORY_CARE_PROVIDER_SITE_OTHER): Payer: Medicare HMO | Admitting: Urology

## 2021-03-01 ENCOUNTER — Encounter: Payer: Self-pay | Admitting: Urology

## 2021-03-01 ENCOUNTER — Other Ambulatory Visit: Payer: Self-pay

## 2021-03-01 VITALS — BP 153/72 | HR 96 | Ht 64.0 in | Wt 224.0 lb

## 2021-03-01 DIAGNOSIS — N3281 Overactive bladder: Secondary | ICD-10-CM | POA: Diagnosis not present

## 2021-03-01 DIAGNOSIS — N3941 Urge incontinence: Secondary | ICD-10-CM

## 2021-03-01 MED ORDER — MIRABEGRON ER 25 MG PO TB24: 25.0000 mg | ORAL_TABLET | Freq: Every day | ORAL | 3 refills | Status: DC

## 2021-03-01 NOTE — Progress Notes (Signed)
03/01/2021 8:52 AM   Kathryn Friedman [birth year] [id number]  Referring provider: Einar Pheasant, MD [address] [place],  [place] [address]  Chief Complaint  Patient presents with  . Over Active Bladder    1year follow up    HPI: 65 y.o. female presents for annual follow-up.   Seen last year for overactive bladder with urge incontinence  Started on tolterodine with marked improvement in her symptoms  States the medication had worked well however ~ 6 weeks ago she began to have worsening frequency, urgency with urge incontinence  Denies dysuria, gross hematuria  Denies flank, abdominal or pelvic pain  No new medications within the last 6 weeks except for Celebrex   PMH: Past Medical History:  Diagnosis Date  . Anemia   . Arthritis    back and knees  . Asthma   . Blood in stool   . Chronic diarrhea   . COPD (chronic obstructive pulmonary disease) ([place])   . Coronary artery disease    s/p stent placement 06/25/06  . Depression    secondary to the death of her husband (died 74)  . Diabetes mellitus without complication ([place]) Diagnosed 05/2008  . Diverticulitis   . Diverticulosis   . Dizzinesses   . Dysphagia   . Fatty infiltration of liver   . GERD (gastroesophageal reflux disease)   . Headache   . Hypertension   . Hypertriglyceridemia   . ILD (interstitial lung disease) ([place])   . Lower abdominal pain   . Lump in the abdomen   . PSVT (paroxysmal supraventricular tachycardia) ([place])   . Sleep apnea    on CPAP  . Spastic colon   . Tobacco abuse   . Venous insufficiency of both lower extremities     Surgical History: Past Surgical History:  Procedure Laterality Date  . ABDOMINAL HYSTERECTOMY  with left ovary in place 1996  . APPENDECTOMY  1985  . BREAST BIOPSY Left 10/14/2017   calcs bx, fibrosis giant cell reaction and chronic inflammation, negative for malignancy.   [facility] CARDIAC CATHETERIZATION  2007   stents  .  CATARACT EXTRACTION, BILATERAL    . CESAREAN SECTION  1984  . CHOLECYSTECTOMY  1985  . COLONOSCOPY WITH PROPOFOL N/A 09/13/2016   Procedure: COLONOSCOPY WITH PROPOFOL;  Surgeon: Manya Silvas, MD;  Location: [facility] ENDOSCOPY;  Service: Endoscopy;  Laterality: N/A;  . COLONOSCOPY WITH PROPOFOL N/A 11/09/2018   Procedure: COLONOSCOPY WITH PROPOFOL;  Surgeon: Manya Silvas, MD;  Location[facility] ENDOSCOPY;  Service: Endoscopy;  Laterality: N/A;  . COLONOSCOPY WITH PROPOFOL N/A 03/29/2020   Procedure: COLONOSCOPY WITH PROPOFOL;  Surgeon: Robert Bellow, MD;  Location: [facility] ENDOSCOPY;  Service: Endoscopy;  Laterality: N/A;  . ESOPHAGOGASTRODUODENOSCOPY (EGD) WITH PROPOFOL N/A 02/02/2018   Procedure: ESOPHAGOGASTRODUODENOSCOPY (EGD) WITH PROPOFOL;  Surgeon: Manya Silvas, MD;  Location: [facility] ENDOSCOPY;  Service: Endoscopy;  Laterality: N/A;  . ESOPHAGOGASTRODUODENOSCOPY (EGD) WITH PROPOFOL N/A 03/29/2020   Procedure: ESOPHAGOGASTRODUODENOSCOPY (EGD) WITH PROPOFOL;  Surgeon: Robert Bellow, MD;  Location: [facility] ENDOSCOPY;  Service: Endoscopy;  Laterality: N/A;  . JOINT REPLACEMENT     bilateral knee replacements  . KNEE ARTHROSCOPY  Arthroscopic left knee surgery   . KNEE SURGERY  status post knee surgey   . LEFT HEART CATH AND CORONARY ANGIOGRAPHY Left 05/14/2018   Procedure: LEFT HEART CATH AND CORONARY ANGIOGRAPHY;  Surgeon: Corey Skains, MD;  Location: [place] CV LAB;  Service: Cardiovascular;  Laterality: Left;  . REPLACEMENT  TOTAL KNEE  ([facility])  . SHOULDER SURGERY  shoulder operation secondary to a torn tendon    Home Medications:  Allergies as of 03/01/2021      Reactions   Varenicline Other (See Comments)   "I got really depressed" "I got really depressed" CHANTEX   Jardiance [empagliflozin] Other (See Comments)   Yeast infection   Methylprednisolone Nausea Only, Nausea And Vomiting      Medication List       Accurate as of March 01, 2021  8:52 AM. If you have any  questions, ask your nurse or doctor.        STOP taking these medications   cholestyramine light 4 GM/DOSE powder Commonly known as: PREVALITE Stopped by: Abbie Sons, MD   IMODIUM A-D PO Stopped by: Abbie Sons, MD   ketorolac 0.5 % ophthalmic solution Commonly known as: ACULAR Stopped by: Abbie Sons, MD   ofloxacin 0.3 % ophthalmic solution Commonly known as: OCUFLOX Stopped by: Abbie Sons, MD   prednisoLONE acetate 1 % ophthalmic suspension Commonly known as: PRED FORTE Stopped by: Abbie Sons, MD     TAKE these medications   albuterol 108 (90 Base) MCG/ACT inhaler Commonly known as: Ventolin HFA INHALE 2 PUFFS FOUR TIMES A DAY   amLODipine 5 MG tablet Commonly known as: NORVASC Take 1 tablet (5 mg total) by mouth daily.   aspirin 81 MG tablet Take 81 mg by mouth daily.   atorvastatin 20 MG tablet Commonly known as: LIPITOR TAKE 1 TABLET  EVERY EVENING.   Breztri Aerosphere 160-9-4.8 MCG/ACT Aero Generic drug: Budeson-Glycopyrrol-Formoterol Inhale 2 puffs into the lungs in the morning and at bedtime.   CALCIUM PO Take 600 mg by mouth daily.   celecoxib 100 MG capsule Commonly known as: CELEBREX Take 100 mg by mouth in the morning and at bedtime.   clopidogrel 75 MG tablet Commonly known as: PLAVIX TAKE 1 TABLET EVERY DAY   cyclobenzaprine 10 MG tablet Commonly known as: FLEXERIL TAKE 1 TABLET THREE TIMES DAILY AS NEEDED FOR MUSCLE SPASM(S)   diclofenac 75 MG EC tablet Commonly known as: VOLTAREN Take by mouth.   DULoxetine 60 MG capsule Commonly known as: CYMBALTA TAKE 1 CAPSULE (60 MG TOTAL) BY MOUTH DAILY.   famotidine 40 MG tablet Commonly known as: PEPCID TAKE 1 TABLET EVERY DAY   glucose blood test strip Use as instructed to check blood sugars twice daily. Dx E11.9.   isosorbide mononitrate 30 MG 24 hr tablet Commonly known as: IMDUR Take 1 tablet (30 mg total) by mouth daily.   L-FORMULA LYSINE HCL PO Take  by mouth.   lamoTRIgine 25 MG tablet Commonly known as: LaMICtal Take 1 tablet (25 mg total) by mouth daily. FOR MOOD   metFORMIN 500 MG 24 hr tablet Commonly known as: GLUCOPHAGE-XR Take 2 tablets (1,000 mg total) by mouth in the morning and at bedtime.   MULTIVITAMIN PO Take 1 tablet by mouth daily.   mupirocin ointment 2 % Commonly known as: Bactroban Apply to affected area on abdomen bid   nystatin powder Commonly known as: MYCOSTATIN/NYSTOP APPLY TO THE AFFECTED AREA(S) TWICE DAILY What changed: Another medication with the same name was removed. Continue taking this medication, and follow the directions you see here. Changed by: Abbie Sons, MD   pantoprazole 40 MG tablet Commonly known as: PROTONIX TAKE 1 TABLET TWICE DAILY BEFORE MEALS   Potassium 99 MG Tabs Take by mouth.   propranolol 40 MG  tablet Commonly known as: INDERAL 1 tab daily PO What changed:   how much to take  how to take this  when to take this   telmisartan 80 MG tablet Commonly known as: Micardis Take 1 tablet (80 mg total) by mouth daily.   tolterodine 4 MG 24 hr capsule Commonly known as: DETROL LA Take 1 capsule (4 mg total) by mouth daily.   traZODone 100 MG tablet Commonly known as: DESYREL TAKE 2 TABLETS AT BEDTIME   True Metrix Meter w/Device Kit   TRUEplus Lancets 33G Misc       Allergies:  Allergies  Allergen Reactions  . Varenicline Other (See Comments)    "I got really depressed" "I got really depressed" CHANTEX  . Jardiance [Empagliflozin] Other (See Comments)    Yeast infection  . Methylprednisolone Nausea Only and Nausea And Vomiting    Family History: Family History  Problem Relation Age of Onset  . Other Mother        Hit by a fire truck and has had multiple operations on her back , and has history of MVP   . Mitral valve prolapse Mother   . Lung cancer Mother   . Depression Mother   . Heart disease Father        myocardial infarction and is  status post bypass surgery  . Mitral valve prolapse Sister   . Bipolar disorder Sister   . Hepatitis C Brother   . Cirrhosis Brother   . Colon cancer Paternal Aunt   . Breast cancer Neg Hx   . Prostate cancer Neg Hx   . Bladder Cancer Neg Hx   . Kidney cancer Neg Hx     Social History:  reports that she has been smoking cigarettes. She has a 67.50 pack-year smoking history. She has never used smokeless tobacco. She reports that she does not drink alcohol and does not use drugs.   Physical Exam: BP (!) 153/72   Pulse 96   Ht _0  (1.626 m)   Wt 224 lb (101.6 kg)   BMI 38.45 kg/m   Constitutional:  Alert and oriented, No acute distress. HEENT: [place] AT, moist mucus membranes.  Trachea midline, no masses. Cardiovascular: No clubbing, cyanosis, or edema. Respiratory: Normal respiratory effort, no increased work of breathing.   Assessment & Plan:    1.  Overactive bladder with urge incontinence  Worsening voiding symptoms on tolterodine  Trial Myrbetriq 25 mg x 4 weeks  Rx Myrbetriq sent to [facility] to see if this will be covered; if not covered will send in a different anticholinergic medication  Cystoscopy for persistent symptoms   Abbie Sons, MD  [facility] [address] [place], [place] [address] [phone]

## 2021-03-02 ENCOUNTER — Telehealth: Payer: Self-pay | Admitting: Family Medicine

## 2021-03-02 ENCOUNTER — Ambulatory Visit (INDEPENDENT_AMBULATORY_CARE_PROVIDER_SITE_OTHER): Payer: Medicare HMO | Admitting: Licensed Clinical Social Worker

## 2021-03-02 ENCOUNTER — Encounter: Payer: Self-pay | Admitting: Licensed Clinical Social Worker

## 2021-03-02 DIAGNOSIS — F4310 Post-traumatic stress disorder, unspecified: Secondary | ICD-10-CM

## 2021-03-02 DIAGNOSIS — F330 Major depressive disorder, recurrent, mild: Secondary | ICD-10-CM | POA: Diagnosis not present

## 2021-03-02 DIAGNOSIS — G4701 Insomnia due to medical condition: Secondary | ICD-10-CM | POA: Diagnosis not present

## 2021-03-02 DIAGNOSIS — F419 Anxiety disorder, unspecified: Secondary | ICD-10-CM | POA: Diagnosis not present

## 2021-03-02 DIAGNOSIS — F17200 Nicotine dependence, unspecified, uncomplicated: Secondary | ICD-10-CM

## 2021-03-02 NOTE — Progress Notes (Signed)
Virtual Visit via Video Note  I connected with Kathryn Friedman on 03/02/21 at  9:00 AM EDT by a video enabled telemedicine application and verified that I am speaking with the correct person using two identifiers.  Participating Parties Patient Provider  Location: Patient: Home Provider: Home Office   I discussed the limitations of evaluation and management by telemedicine and the availability of in person appointments. The patient expressed understanding and agreed to proceed.  THERAPY PROGRESS NOTE  Session Time: 30 Minutes  Participation Level: Active  Behavioral Response: CasualAlertDepressed  Type of Therapy: Individual Therapy  Treatment Goals addressed: Coping  Interventions: CBT  Summary: [name] is a 65 y.o. female who presents with depression and anxiety sxs. Pt reported the inflammation from her knee has lifted after discussing with her doctor and having medication changed. Pt reported that she isn't so sure her neighbor will continue to be a source of social support and had concerns about her crossing boundaries. Pt reported that she continues to keep in touch with her adult son and enjoys taking care of her pets. Pt reported she is a bit sad about this therapist leaving s/ sessions "have really helped me". Pt reported she would like to stay with [facility] and see the new therapist who has yet to be confirmed at this time. Pt to follow up for last session with this therapist in 2 weeks.    Suicidal/Homicidal: No  Therapist Response: Therapist met with patient for follow up. Therapist and patient explored thoughts, feelings and reactions to current stressors. Therapist and patient discussed connection with supports and setting boundaries.  Plan: Return again in 2 weeks.  Diagnosis: Axis I: Anxiety Disorder NOS, Post Traumatic Stress Disorder and MDD, Recurrent, Mild, Tobacco Use, Insomnia    Axis II: N/A  Josephine Igo, LCSW,  LCAS 03/02/2021

## 2021-03-02 NOTE — Telephone Encounter (Signed)
Patient called and spoke to [facility] about getting the Myrbetriq. [employer] will cover the medication but the cost will be 125.00. She asked them about a tier exception and they told her that one cannot be done for that medication. They recommended she try Oxybutynin. Please advise.

## 2021-03-02 NOTE — Telephone Encounter (Signed)
Spoke to patient informed her that a PA has been filled out. She wanted them to send me a tier exception form. I have not received that yet.

## 2021-03-05 ENCOUNTER — Other Ambulatory Visit: Payer: Self-pay | Admitting: Internal Medicine

## 2021-03-05 ENCOUNTER — Other Ambulatory Visit: Payer: Self-pay | Admitting: *Deleted

## 2021-03-05 MED ORDER — OXYBUTYNIN CHLORIDE ER 10 MG PO TB24
10.0000 mg | ORAL_TABLET | Freq: Every day | ORAL | 0 refills | Status: DC
Start: 2021-03-05 — End: 2021-04-04

## 2021-03-05 MED ORDER — OXYBUTYNIN CHLORIDE ER 10 MG PO TB24
10.0000 mg | ORAL_TABLET | Freq: Every day | ORAL | 0 refills | Status: DC
Start: 2021-03-05 — End: 2021-03-05

## 2021-03-05 NOTE — Telephone Encounter (Signed)
Rx oxybutynin sent to pharmacy.  Please let her know the Myrbetriq cost

## 2021-03-05 NOTE — Telephone Encounter (Signed)
Sent the  Oxybutynin to [name]  Per patient

## 2021-03-06 ENCOUNTER — Telehealth: Payer: Self-pay | Admitting: Internal Medicine

## 2021-03-06 DIAGNOSIS — Z1231 Encounter for screening mammogram for malignant neoplasm of breast: Secondary | ICD-10-CM

## 2021-03-06 NOTE — Telephone Encounter (Signed)
Mammogram ordered

## 2021-03-06 NOTE — Addendum Note (Signed)
Addended by: Lars Masson on: 03/06/2021 09:16 AM   Modules accepted: Orders

## 2021-03-06 NOTE — Telephone Encounter (Signed)
Pt needs an order place for a mammogram to [facility]

## 2021-03-06 NOTE — Telephone Encounter (Signed)
Patient is aware 

## 2021-03-07 ENCOUNTER — Telehealth: Payer: Self-pay | Admitting: Internal Medicine

## 2021-03-07 NOTE — Telephone Encounter (Signed)
Pt called and said that [facility] sent over a PA for her nystatin (MYCOSTATIN/NYSTOP) powder. She was calling to follow up

## 2021-03-07 NOTE — Telephone Encounter (Signed)
Attempted to file PA. Patients insurance is not going through. Will need to call [employer].

## 2021-03-08 DIAGNOSIS — K1370 Unspecified lesions of oral mucosa: Secondary | ICD-10-CM | POA: Diagnosis not present

## 2021-03-08 DIAGNOSIS — R0600 Dyspnea, unspecified: Secondary | ICD-10-CM | POA: Diagnosis not present

## 2021-03-08 DIAGNOSIS — J439 Emphysema, unspecified: Secondary | ICD-10-CM | POA: Diagnosis not present

## 2021-03-08 DIAGNOSIS — J310 Chronic rhinitis: Secondary | ICD-10-CM | POA: Diagnosis not present

## 2021-03-08 DIAGNOSIS — G4733 Obstructive sleep apnea (adult) (pediatric): Secondary | ICD-10-CM | POA: Diagnosis not present

## 2021-03-08 NOTE — Telephone Encounter (Signed)
Patient aware that PA for nystatin has been submitted and approved for the year.  Key: [id number]

## 2021-03-13 ENCOUNTER — Other Ambulatory Visit: Payer: Self-pay | Admitting: Psychiatry

## 2021-03-13 DIAGNOSIS — F330 Major depressive disorder, recurrent, mild: Secondary | ICD-10-CM

## 2021-03-16 ENCOUNTER — Ambulatory Visit (INDEPENDENT_AMBULATORY_CARE_PROVIDER_SITE_OTHER): Payer: Medicare HMO | Admitting: Licensed Clinical Social Worker

## 2021-03-16 ENCOUNTER — Other Ambulatory Visit: Payer: Self-pay

## 2021-03-16 ENCOUNTER — Encounter: Payer: Self-pay | Admitting: Licensed Clinical Social Worker

## 2021-03-16 DIAGNOSIS — G4701 Insomnia due to medical condition: Secondary | ICD-10-CM | POA: Diagnosis not present

## 2021-03-16 DIAGNOSIS — F17200 Nicotine dependence, unspecified, uncomplicated: Secondary | ICD-10-CM | POA: Diagnosis not present

## 2021-03-16 DIAGNOSIS — F419 Anxiety disorder, unspecified: Secondary | ICD-10-CM

## 2021-03-16 DIAGNOSIS — F4310 Post-traumatic stress disorder, unspecified: Secondary | ICD-10-CM

## 2021-03-16 DIAGNOSIS — F331 Major depressive disorder, recurrent, moderate: Secondary | ICD-10-CM | POA: Diagnosis not present

## 2021-03-16 NOTE — Progress Notes (Signed)
Virtual Visit via Video Note  I connected with Kathryn Friedman on 03/16/21 at  9:00 AM EDT by a video enabled telemedicine application and verified that I am speaking with the correct person using two identifiers.  Participating Parties Patient Provider  Location: Patient: Home Provider: Home Office   I discussed the limitations of evaluation and management by telemedicine and the availability of in person appointments. The patient expressed understanding and agreed to proceed.  THERAPY PROGRESS NOTE  Session Time: 30 Minutes  Participation Level: Active  Behavioral Response: TiredAlertDepressed  Type of Therapy: Individual Therapy  Treatment Goals addressed: Coping  Interventions: CBT  Summary: Kathryn Friedman is a 65 y.o. female who presents with depression and anxiety sxs. Pt reported feeling "not good, fed up with my husband. I am miserable". Pt reported she has not had much sleep and bags under eyes appeared very puffy. Pt reported that she is overwhelmed taking care of both her husband and neighbor. Pt reported that her husband has an appointment scheduled with is doctor and will be "running a test to see if he has dementia or Alzheimer's". Pt denied any concerns around safety and that her husband has not displayed any aggressive behaviors. Pt reported her neighbor who is also a close friend has issues with cocaine abuse and is linked to the [place]. Pt reported she often checks in on her and encourages neighbor to get help for her substance use and mental health problems. Pt reported lacking social supports and is doubtful that her son would be available to help out s/ "he is always working".  Suicidal/Homicidal: No  Therapist Response: Therapist met with patient for follow up. Therapist and patient explored thoughts, feelings and reactions to current stressors. Therapist and patient discussed ways to help others get the care they need while also relying on  supports and caring for her own needs. Pt was receptive. Therapist to send patient resources.  Plan: Return again as needed to resume therapy with new therapist. Follow up with psychiatrist.  Diagnosis: Axis I: Anxiety Disorder NOS, Post Traumatic Stress Disorder and MDD, Recurrent, Moderate, Tobacco Use, Insomnia    Axis II: N/A  Josephine Igo, LCSW, LCAS 03/16/2021

## 2021-03-23 ENCOUNTER — Other Ambulatory Visit: Payer: Self-pay | Admitting: Orthopedic Surgery

## 2021-03-23 DIAGNOSIS — M5416 Radiculopathy, lumbar region: Secondary | ICD-10-CM

## 2021-03-27 ENCOUNTER — Other Ambulatory Visit: Payer: Self-pay

## 2021-03-27 ENCOUNTER — Ambulatory Visit: Payer: Medicare HMO | Admitting: Psychiatry

## 2021-03-28 DIAGNOSIS — G4733 Obstructive sleep apnea (adult) (pediatric): Secondary | ICD-10-CM | POA: Diagnosis not present

## 2021-03-30 ENCOUNTER — Other Ambulatory Visit: Payer: Self-pay

## 2021-03-30 ENCOUNTER — Other Ambulatory Visit (INDEPENDENT_AMBULATORY_CARE_PROVIDER_SITE_OTHER): Payer: Medicare HMO

## 2021-03-30 DIAGNOSIS — E7800 Pure hypercholesterolemia, unspecified: Secondary | ICD-10-CM | POA: Diagnosis not present

## 2021-03-30 DIAGNOSIS — D509 Iron deficiency anemia, unspecified: Secondary | ICD-10-CM | POA: Diagnosis not present

## 2021-03-30 DIAGNOSIS — E1159 Type 2 diabetes mellitus with other circulatory complications: Secondary | ICD-10-CM | POA: Diagnosis not present

## 2021-03-30 DIAGNOSIS — I10 Essential (primary) hypertension: Secondary | ICD-10-CM | POA: Diagnosis not present

## 2021-03-30 LAB — IBC + FERRITIN
Ferritin: 28 ng/mL (ref 10.0–291.0)
Iron: 83 ug/dL (ref 42–145)
Saturation Ratios: 21.2 % (ref 20.0–50.0)
Transferrin: 280 mg/dL (ref 212.0–360.0)

## 2021-03-30 LAB — CBC WITH DIFFERENTIAL/PLATELET
Basophils Absolute: 0.1 10*3/uL (ref 0.0–0.1)
Basophils Relative: 0.5 % (ref 0.0–3.0)
Eosinophils Absolute: 0.3 10*3/uL (ref 0.0–0.7)
Eosinophils Relative: 2.6 % (ref 0.0–5.0)
HCT: 46.6 % — ABNORMAL HIGH (ref 36.0–46.0)
Hemoglobin: 15.8 g/dL — ABNORMAL HIGH (ref 12.0–15.0)
Lymphocytes Relative: 22.6 % (ref 12.0–46.0)
Lymphs Abs: 2.4 10*3/uL (ref 0.7–4.0)
MCHC: 33.9 g/dL (ref 30.0–36.0)
MCV: 94.9 fl (ref 78.0–100.0)
Monocytes Absolute: 0.5 10*3/uL (ref 0.1–1.0)
Monocytes Relative: 4.7 % (ref 3.0–12.0)
Neutro Abs: 7.4 10*3/uL (ref 1.4–7.7)
Neutrophils Relative %: 69.6 % (ref 43.0–77.0)
Platelets: 191 10*3/uL (ref 150.0–400.0)
RBC: 4.91 Mil/uL (ref 3.87–5.11)
RDW: 15.8 % — ABNORMAL HIGH (ref 11.5–15.5)
WBC: 10.6 10*3/uL — ABNORMAL HIGH (ref 4.0–10.5)

## 2021-03-30 LAB — HEPATIC FUNCTION PANEL
ALT: 26 U/L (ref 0–35)
AST: 27 U/L (ref 0–37)
Albumin: 4.1 g/dL (ref 3.5–5.2)
Alkaline Phosphatase: 70 U/L (ref 39–117)
Bilirubin, Direct: 0.1 mg/dL (ref 0.0–0.3)
Total Bilirubin: 0.5 mg/dL (ref 0.2–1.2)
Total Protein: 6.7 g/dL (ref 6.0–8.3)

## 2021-03-30 LAB — LDL CHOLESTEROL, DIRECT: Direct LDL: 103 mg/dL

## 2021-03-30 LAB — BASIC METABOLIC PANEL
BUN: 8 mg/dL (ref 6–23)
CO2: 27 mEq/L (ref 19–32)
Calcium: 9.2 mg/dL (ref 8.4–10.5)
Chloride: 100 mEq/L (ref 96–112)
Creatinine, Ser: 0.66 mg/dL (ref 0.40–1.20)
GFR: 92.29 mL/min (ref >60.00)
Glucose, Bld: 123 mg/dL — ABNORMAL HIGH (ref 70–99)
Potassium: 3.9 mEq/L (ref 3.5–5.1)
Sodium: 136 mEq/L (ref 135–145)

## 2021-03-30 LAB — LIPID PANEL
Cholesterol: 175 mg/dL (ref 0–200)
HDL: 41.7 mg/dL (ref >39.00)
NonHDL: 133.77
Total CHOL/HDL Ratio: 4
Triglycerides: 298 mg/dL — ABNORMAL HIGH (ref 0.0–149.0)
VLDL: 59.6 mg/dL — ABNORMAL HIGH (ref 0.0–40.0)

## 2021-03-30 LAB — HEMOGLOBIN A1C: Hgb A1c MFr Bld: 7.1 % — ABNORMAL HIGH (ref 4.6–6.5)

## 2021-04-03 ENCOUNTER — Encounter: Payer: Self-pay | Admitting: Internal Medicine

## 2021-04-03 ENCOUNTER — Other Ambulatory Visit: Payer: Self-pay

## 2021-04-03 ENCOUNTER — Ambulatory Visit (INDEPENDENT_AMBULATORY_CARE_PROVIDER_SITE_OTHER): Payer: Medicare HMO | Admitting: Internal Medicine

## 2021-04-03 DIAGNOSIS — I700 Atherosclerosis of aorta: Secondary | ICD-10-CM | POA: Diagnosis not present

## 2021-04-03 DIAGNOSIS — F32A Depression, unspecified: Secondary | ICD-10-CM

## 2021-04-03 DIAGNOSIS — I10 Essential (primary) hypertension: Secondary | ICD-10-CM

## 2021-04-03 DIAGNOSIS — G4733 Obstructive sleep apnea (adult) (pediatric): Secondary | ICD-10-CM | POA: Diagnosis not present

## 2021-04-03 DIAGNOSIS — J449 Chronic obstructive pulmonary disease, unspecified: Secondary | ICD-10-CM | POA: Diagnosis not present

## 2021-04-03 DIAGNOSIS — I471 Supraventricular tachycardia, unspecified: Secondary | ICD-10-CM

## 2021-04-03 DIAGNOSIS — D509 Iron deficiency anemia, unspecified: Secondary | ICD-10-CM

## 2021-04-03 DIAGNOSIS — J849 Interstitial pulmonary disease, unspecified: Secondary | ICD-10-CM

## 2021-04-03 DIAGNOSIS — F320 Major depressive disorder, single episode, mild: Secondary | ICD-10-CM

## 2021-04-03 DIAGNOSIS — E1159 Type 2 diabetes mellitus with other circulatory complications: Secondary | ICD-10-CM

## 2021-04-03 DIAGNOSIS — I2510 Atherosclerotic heart disease of native coronary artery without angina pectoris: Secondary | ICD-10-CM | POA: Diagnosis not present

## 2021-04-03 DIAGNOSIS — K219 Gastro-esophageal reflux disease without esophagitis: Secondary | ICD-10-CM | POA: Diagnosis not present

## 2021-04-03 DIAGNOSIS — E7800 Pure hypercholesterolemia, unspecified: Secondary | ICD-10-CM

## 2021-04-03 DIAGNOSIS — F17200 Nicotine dependence, unspecified, uncomplicated: Secondary | ICD-10-CM

## 2021-04-03 MED ORDER — ROSUVASTATIN CALCIUM 20 MG PO TABS: 20.0000 mg | ORAL_TABLET | Freq: Every day | ORAL | 1 refills | Status: DC

## 2021-04-03 NOTE — Patient Instructions (Signed)
Call cardiology to reschedule your stress test.  I spoke to them and they informed me that you just needed to call and reschedule.

## 2021-04-03 NOTE — Progress Notes (Signed)
Patient ID: Kathryn Friedman, female   DOB: 07-29-1956, 65 y.o.   MRN: [id number]   Subjective:    Patient ID: Kathryn Friedman, female    DOB: 1956-02-15, 65 y.o.   MRN: [id number]  HPI This visit occurred during the SARS-CoV-2 public health emergency.  Safety protocols were in place, including screening questions prior to the visit, additional usage of staff PPE, and extensive cleaning of exam room while observing appropriate contact time as indicated for disinfecting solutions.  Patient here for a scheduled follow up.  Here to follow up regarding her blood pressure, cholesterol and blood sugar.  Recently saw cardiology.  Had recent ECHO 02/27/21 - normal LV function with mild MR, TR and EF 55%.  Had ordered stress test.  Has noticed some intermittent chest heaviness and sob.  No pain, heaviness currently. They had ordered stress test and she did not reschedule.  No acid reflux reported.  No abdominal pain.  States am sugars 120-130 and pm sugars 180-190.  Having left low back pain.  Seeing ortho.  Scheduled for MRI tomorrow.  Blood pressure dong well.  Increased stress. Discussed.  Seeing psychiatry.  Overall stable.   Past Medical History:  Diagnosis Date  . Anemia   . Arthritis    back and knees  . Asthma   . Blood in stool   . Chronic diarrhea   . COPD (chronic obstructive pulmonary disease) ([place])   . Coronary artery disease    s/p stent placement 06/25/06  . Depression    secondary to the death of her husband (died 66)  . Diabetes mellitus without complication ([place]) Diagnosed 05/2008  . Diverticulitis   . Diverticulosis   . Dizzinesses   . Dysphagia   . Fatty infiltration of liver   . GERD (gastroesophageal reflux disease)   . Headache   . Hypertension   . Hypertriglyceridemia   . ILD (interstitial lung disease) ([place])   . Lower abdominal pain   . Lump in the abdomen   . PSVT (paroxysmal supraventricular tachycardia) ([place])   . Sleep apnea    on CPAP  . Spastic  colon   . Tobacco abuse   . Venous insufficiency of both lower extremities    Past Surgical History:  Procedure Laterality Date  . ABDOMINAL HYSTERECTOMY  with left ovary in place 1996  . APPENDECTOMY  1985  . BREAST BIOPSY Left 10/14/2017   calcs bx, fibrosis giant cell reaction and chronic inflammation, negative for malignancy.   Marland Kitchen CARDIAC CATHETERIZATION  2007   stents  . CATARACT EXTRACTION, BILATERAL    . CESAREAN SECTION  1984  . CHOLECYSTECTOMY  1985  . COLONOSCOPY WITH PROPOFOL N/A 09/13/2016   Procedure: COLONOSCOPY WITH PROPOFOL;  Surgeon: Manya Silvas, MD;  Location: [facility] ENDOSCOPY;  Service: Endoscopy;  Laterality: N/A;  . COLONOSCOPY WITH PROPOFOL N/A 11/09/2018   Procedure: COLONOSCOPY WITH PROPOFOL;  Surgeon: Manya Silvas, MD;  Location: [facility] ENDOSCOPY;  Service: Endoscopy;  Laterality: N/A;  . COLONOSCOPY WITH PROPOFOL N/A 03/29/2020   Procedure: COLONOSCOPY WITH PROPOFOL;  Surgeon: Robert Bellow, MD;  Location: [facility] ENDOSCOPY;  Service: Endoscopy;  Laterality: N/A;  . ESOPHAGOGASTRODUODENOSCOPY (EGD) WITH PROPOFOL N/A 02/02/2018   Procedure: ESOPHAGOGASTRODUODENOSCOPY (EGD) WITH PROPOFOL;  Surgeon: Manya Silvas, MD;  Location: [facility] ENDOSCOPY;  Service: Endoscopy;  Laterality: N/A;  . ESOPHAGOGASTRODUODENOSCOPY (EGD) WITH PROPOFOL N/A 03/29/2020   Procedure: ESOPHAGOGASTRODUODENOSCOPY (EGD) WITH PROPOFOL;  Surgeon: Robert Bellow, MD;  Location: [facility] ENDOSCOPY;  Service:  Endoscopy;  Laterality: N/A;  . JOINT REPLACEMENT     bilateral knee replacements  . KNEE ARTHROSCOPY  Arthroscopic left knee surgery   . KNEE SURGERY  status post knee surgey   . LEFT HEART CATH AND CORONARY ANGIOGRAPHY Left 05/14/2018   Procedure: LEFT HEART CATH AND CORONARY ANGIOGRAPHY;  Surgeon: Corey Skains, MD;  Location: [place] [facility];  Service: Cardiovascular;  Laterality: Left;  . REPLACEMENT TOTAL KNEE  ([facility])  . SHOULDER SURGERY  shoulder operation secondary to a  torn tendon   Family History  Problem Relation Age of Onset  . Other Mother        Hit by a fire truck and has had multiple operations on her back , and has history of MVP   . Mitral valve prolapse Mother   . Lung cancer Mother   . Depression Mother   . Heart disease Father        myocardial infarction and is status post bypass surgery  . Mitral valve prolapse Sister   . Bipolar disorder Sister   . Hepatitis C Brother   . Cirrhosis Brother   . Colon cancer Paternal Aunt   . Breast cancer Neg Hx   . Prostate cancer Neg Hx   . Bladder Cancer Neg Hx   . Kidney cancer Neg Hx    Social History   Socioeconomic History  . Marital status: Married    Spouse name: Not on file  . Number of children: 1  . Years of education: Not on file  . Highest education level: Not on file  Occupational History    Employer: [employer]  Tobacco Use  . Smoking status: Current Every Day Smoker    Packs/day: 1.50    Years: 45.00    Pack years: 67.50    Types: Cigarettes  . Smokeless tobacco: Never Used  Vaping Use  . Vaping Use: Former  Substance and Sexual Activity  . Alcohol use: No    Alcohol/week: 0.0 standard drinks  . Drug use: No  . Sexual activity: Not on file  Other Topics Concern  . Not on file  Social History Narrative  . Not on file   Social Determinants of Health   Financial Resource Strain: Medium Risk  . Difficulty of Paying Living Expenses: Somewhat hard  Food Insecurity: Not on file  Transportation Needs: Not on file  Physical Activity: Not on file  Stress: Stress Concern Present  . Feeling of Stress : Very much  Social Connections: Not on file    Outpatient Encounter Medications as of 04/03/2021  Medication Sig  . [DISCONTINUED] rosuvastatin (CRESTOR) 20 MG tablet Take 1 tablet (20 mg total) by mouth daily.  Marland Kitchen aspirin 81 MG tablet Take 81 mg by mouth daily.   . Blood Glucose Monitoring Suppl (TRUE METRIX METER) w/Device KIT   . CALCIUM PO Take 600 mg by mouth daily.    . cyclobenzaprine (FLEXERIL) 10 MG tablet TAKE 1 TABLET THREE TIMES DAILY AS NEEDED FOR MUSCLE SPASM(S)  . diclofenac (VOLTAREN) 75 MG EC tablet Take by mouth.  . isosorbide mononitrate (IMDUR) 30 MG 24 hr tablet Take 1 tablet (30 mg total) by mouth daily.  . L-FORMULA LYSINE HCL PO Take by mouth.  . lamoTRIgine (LAMICTAL) 25 MG tablet TAKE 1 TABLET EVERY DAY FOR MOOD  . metFORMIN (GLUCOPHAGE-XR) 500 MG 24 hr tablet Take 2 tablets (1,000 mg total) by mouth in the morning and at bedtime.  . Multiple Vitamin (MULTIVITAMIN PO) Take  1 tablet by mouth daily.  . mupirocin ointment (BACTROBAN) 2 % Apply to affected area on abdomen bid  . nystatin (MYCOSTATIN/NYSTOP) powder APPLY TO THE AFFECTED AREA(S) TWICE DAILY  . Potassium 99 MG TABS Take by mouth.  . TRUEplus Lancets 33G MISC   . [DISCONTINUED] albuterol (VENTOLIN HFA) 108 (90 Base) MCG/ACT inhaler INHALE 2 PUFFS FOUR TIMES A DAY  . [DISCONTINUED] amLODipine (NORVASC) 5 MG tablet Take 1 tablet (5 mg total) by mouth daily.  . [DISCONTINUED] atorvastatin (LIPITOR) 20 MG tablet TAKE 1 TABLET  EVERY EVENING.  . [DISCONTINUED] Budeson-Glycopyrrol-Formoterol (BREZTRI AEROSPHERE) 160-9-4.8 MCG/ACT AERO Inhale 2 puffs into the lungs in the morning and at bedtime.  . [DISCONTINUED] clopidogrel (PLAVIX) 75 MG tablet TAKE 1 TABLET EVERY DAY  . [DISCONTINUED] DULoxetine (CYMBALTA) 60 MG capsule TAKE 1 CAPSULE (60 MG TOTAL) BY MOUTH DAILY.  . [DISCONTINUED] famotidine (PEPCID) 40 MG tablet TAKE 1 TABLET EVERY DAY  . [DISCONTINUED] glucose blood test strip Use as instructed to check blood sugars twice daily. Dx E11.9.  . [DISCONTINUED] oxybutynin (DITROPAN-XL) 10 MG 24 hr tablet Take 1 tablet (10 mg total) by mouth daily.  . [DISCONTINUED] pantoprazole (PROTONIX) 40 MG tablet TAKE 1 TABLET TWICE DAILY BEFORE MEALS  . [DISCONTINUED] propranolol (INDERAL) 40 MG tablet 1 tab daily PO (Patient taking differently: Take 40 mg by mouth 2 (two) times daily. 1 tab  daily PO)  . [DISCONTINUED] telmisartan (MICARDIS) 80 MG tablet Take 1 tablet (80 mg total) by mouth daily.  . [DISCONTINUED] traZODone (DESYREL) 100 MG tablet TAKE 2 TABLETS AT BEDTIME   No facility-administered encounter medications on file as of 04/03/2021.    Review of Systems  Constitutional: Negative for appetite change and unexpected weight change.  HENT: Negative for congestion and sinus pressure.   Respiratory: Negative for cough.        Has noticed some sob as outlined.   Cardiovascular: Negative for palpitations and leg swelling.       Intermittent chest heaviness.    Gastrointestinal: Negative for vomiting.  Genitourinary: Negative for difficulty urinating and dysuria.  Musculoskeletal: Negative for joint swelling and myalgias.  Skin: Negative for color change and rash.  Neurological: Negative for dizziness, light-headedness and headaches.  Psychiatric/Behavioral: Negative for agitation and dysphoric mood.       Objective:    Physical Exam Vitals reviewed.  Constitutional:      General: She is not in acute distress.    Appearance: Normal appearance.  HENT:     Head: Normocephalic and atraumatic.     Right Ear: External ear normal.     Left Ear: External ear normal.  Eyes:     General: No scleral icterus.       Right eye: No discharge.        Left eye: No discharge.     Conjunctiva/sclera: Conjunctivae normal.  Neck:     Thyroid: No thyromegaly.  Cardiovascular:     Rate and Rhythm: Normal rate and regular rhythm.  Pulmonary:     Effort: No respiratory distress.     Breath sounds: Normal breath sounds. No wheezing.  Abdominal:     General: Bowel sounds are normal.     Palpations: Abdomen is soft.     Tenderness: There is no abdominal tenderness.  Musculoskeletal:        General: No swelling or tenderness.     Cervical back: Neck supple. No tenderness.  Lymphadenopathy:     Cervical: No cervical adenopathy.  Skin:  Findings: No erythema or rash.   Neurological:     Mental Status: She is alert.  Psychiatric:        Mood and Affect: Mood normal.        Behavior: Behavior normal.     BP 114/66   Pulse 65   Temp 97.9 F (36.6 C) (Oral)   Resp 16   Ht _0  (1.626 m)   Wt 223 lb (101.2 kg)   SpO2 98%   BMI 38.28 kg/m  Wt Readings from Last 3 Encounters:  04/03/21 223 lb (101.2 kg)  03/01/21 224 lb (101.6 kg)  02/19/21 221 lb (100.2 kg)     Lab Results  Component Value Date   WBC 10.6 (H) 03/30/2021   HGB 15.8 (H) 03/30/2021   HCT 46.6 (H) 03/30/2021   PLT 191.0 03/30/2021   GLUCOSE 123 (H) 03/30/2021   CHOL 175 03/30/2021   TRIG 298.0 (H) 03/30/2021   HDL 41.70 03/30/2021   LDLDIRECT 103.0 03/30/2021   LDLCALC 53 07/06/2020   ALT 26 03/30/2021   AST 27 03/30/2021   NA 136 03/30/2021   K 3.9 03/30/2021   CL 100 03/30/2021   CREATININE 0.66 03/30/2021   BUN 8 03/30/2021   CO2 27 03/30/2021   TSH 1.56 03/17/2020   HGBA1C 7.1 (H) 03/30/2021   MICROALBUR 0.9 03/17/2020    CT CHEST LUNG CANCER SCREENING LOW DOSE WO CONTRAST  Result Date: 02/19/2021 CLINICAL DATA:  Lung cancer screening. 73.5 pack-year history. Current asymptomatic smoker EXAM: CT CHEST WITHOUT CONTRAST LOW-DOSE FOR LUNG CANCER SCREENING TECHNIQUE: Multidetector CT imaging of the chest was performed following the standard protocol without IV contrast. COMPARISON:  01/27/2020 FINDINGS: Cardiovascular: Heart size normal. No pericardial effusion. Aortic atherosclerosis. Coronary artery atherosclerotic calcifications. Mediastinum/Nodes: No discrete thyroid nodules. The trachea appears patent and is midline. Previous 1.3 cm right paratracheal lymph node measures the same on today's study. Stable 1.3 cm subcarinal lymph node. No enlarged mediastinal, supraclavicular or axillary lymph nodes by CT criteria. Hilar lymph nodes are suboptimally evaluated due to lack of IV contrast. Lungs/Pleura: No pleural effusion, airspace consolidation or atelectasis. Mild  centrilobular emphysema. Patchy and confluent ground-glass centrilobular nodularity is again noted with upper lung zone predominance. Findings are compatible with smoking related respiratory bronchiolitis. Previously noted lung nodules are unchanged from previous exam. The largest nodule appears non solid and is in the superior segment of right lower lobe with an equivalent diameter of 6.4 mm. No new lung nodules identified. Upper Abdomen: No acute abnormality. Aortic atherosclerosis. Hepatic steatosis. Musculoskeletal: Spondylosis identified within the thoracic spine. No acute or suspicious osseous findings IMPRESSION: 1. Lung-RADS 2, benign appearance or behavior. Continue annual screening with low-dose chest CT without contrast in 12 months. 2. Aortic Atherosclerosis (ICD10-I70.0) and Emphysema (ICD10-J43.9). Coronary artery calcifications noted. 3. Hepatic steatosis. Electronically Signed   By: Kerby Moors M.D.   On: 02/19/2021 15:47       Assessment & Plan:   Problem List Items Addressed This Visit    Aortic atherosclerosis ([place])    Continue lipitor.       CAD (coronary artery disease)    S/p stent placement.  Continue lipitor.  Given chest heaviness and sob, discussed EKG.  She declined.  Sees cardiology. Just had echo as outlined.  Discussed f/u with cardiology for further evaluation and w/up.  She did not have her stress test they ordered.  Did not reschedule.  Called cardiology.  They informed me she would need to call  and reschedule.  Symptoms - stable from her visit, but given the chest heaviness and sob, I do feel she needs to complete the cardiac w/up.  She is agreeable. Discussed if any worsening symptoms or recurring symptoms, she is to be evaluated.        COPD (chronic obstructive pulmonary disease) (H[facility])    Continue symbicort.  Non increased cough or congestion.  Follow. Plan cardiac w/up as outlined.       Diabetes ([place])    Continue metformin.  Low carb diet and exercise.   Follow met b and a1c.       Essential hypertension, benign    Blood pressure as outlined.  Continue micardis 15m q day.  Follow pressures.  Follow metabolic panel.       GERD (gastroesophageal reflux disease)    No upper symptoms reported.  On protonix.       Hypercholesterolemia    On lipitor. Discussed labs. Change to crestor 252mq day.  Low cholesterol diet and exercise.  Follow lipid panel and liver function tests.        ILD (interstitial lung disease) ([place]   Continue on symbicort.  No increased cough or congestion.  Follow.       Iron deficiency anemia    Follow cbc and iron studies.       Mild depression ([place]   Seeing psychiatry.  Doing well on current regimen.  Taking trazodone and cymbalta.        Obstructive sleep apnea    CPAP.       PSVT (paroxysmal supraventricular tachycardia) ([place]   Documented history.  No increased heart rate reported.  Plan f/u with cardiology as outlined.       Tobacco use disorder    Discussed the need to quit smoking.  Declines to quit.           ChEinar PheasantMD

## 2021-04-04 ENCOUNTER — Other Ambulatory Visit: Payer: Self-pay

## 2021-04-04 ENCOUNTER — Telehealth: Payer: Self-pay

## 2021-04-04 ENCOUNTER — Ambulatory Visit
Admission: RE | Admit: 2021-04-04 | Discharge: 2021-04-04 | Disposition: A | Discharge status: Home / Self Care | Payer: Medicare HMO | Source: Ambulatory Visit | Attending: Orthopedic Surgery | Admitting: Orthopedic Surgery

## 2021-04-04 DIAGNOSIS — M545 Low back pain, unspecified: Secondary | ICD-10-CM | POA: Diagnosis not present

## 2021-04-04 DIAGNOSIS — M5416 Radiculopathy, lumbar region: Secondary | ICD-10-CM | POA: Insufficient documentation

## 2021-04-04 DIAGNOSIS — J449 Chronic obstructive pulmonary disease, unspecified: Secondary | ICD-10-CM

## 2021-04-04 DIAGNOSIS — F330 Major depressive disorder, recurrent, mild: Secondary | ICD-10-CM

## 2021-04-04 MED ORDER — ALBUTEROL SULFATE HFA 108 (90 BASE) MCG/ACT IN AERS: INHALATION_SPRAY | RESPIRATORY_TRACT | 11 refills | Status: DC

## 2021-04-04 MED ORDER — AMLODIPINE BESYLATE 5 MG PO TABS: 5.0000 mg | ORAL_TABLET | Freq: Every day | ORAL | 1 refills | Status: DC

## 2021-04-04 MED ORDER — CLOPIDOGREL BISULFATE 75 MG PO TABS
1.0000 | ORAL_TABLET | Freq: Every day | ORAL | 0 refills | Status: DC
Start: 2021-04-04 — End: 2021-04-13

## 2021-04-04 MED ORDER — PROPRANOLOL HCL 40 MG PO TABS: ORAL_TABLET | ORAL | 1 refills | Status: DC

## 2021-04-04 MED ORDER — PANTOPRAZOLE SODIUM 40 MG PO TBEC: DELAYED_RELEASE_TABLET | ORAL | 1 refills | Status: DC

## 2021-04-04 MED ORDER — ROSUVASTATIN CALCIUM 20 MG PO TABS: 20.0000 mg | ORAL_TABLET | Freq: Every day | ORAL | 1 refills | Status: DC

## 2021-04-04 MED ORDER — FAMOTIDINE 40 MG PO TABS: 40.0000 mg | ORAL_TABLET | Freq: Every day | ORAL | 1 refills | Status: DC

## 2021-04-04 MED ORDER — DULOXETINE HCL 60 MG PO CPEP: 60.0000 mg | ORAL_CAPSULE | Freq: Every day | ORAL | 1 refills | Status: DC

## 2021-04-04 MED ORDER — GLUCOSE BLOOD VI STRP: ORAL_STRIP | 12 refills | Status: DC

## 2021-04-04 MED ORDER — TELMISARTAN 80 MG PO TABS: 80.0000 mg | ORAL_TABLET | Freq: Every day | ORAL | 1 refills | Status: DC

## 2021-04-04 MED ORDER — OXYBUTYNIN CHLORIDE ER 10 MG PO TB24: 10.0000 mg | ORAL_TABLET | Freq: Every day | ORAL | 0 refills | Status: DC

## 2021-04-04 MED ORDER — TRAZODONE HCL 100 MG PO TABS: 200.0000 mg | ORAL_TABLET | Freq: Every day | ORAL | 1 refills | Status: DC

## 2021-04-04 MED ORDER — BREZTRI AEROSPHERE 160-9-4.8 MCG/ACT IN AERO: 2.0000 | INHALATION_SPRAY | Freq: Two times a day (BID) | RESPIRATORY_TRACT | 3 refills | Status: DC

## 2021-04-04 NOTE — Telephone Encounter (Signed)
Pt called and states that she needs all of her meds refilled. PLEASE SEND TO [facility]

## 2021-04-05 ENCOUNTER — Telehealth (INDEPENDENT_AMBULATORY_CARE_PROVIDER_SITE_OTHER): Payer: Medicare HMO | Admitting: Psychiatry

## 2021-04-05 ENCOUNTER — Encounter: Payer: Self-pay | Admitting: Psychiatry

## 2021-04-05 ENCOUNTER — Ambulatory Visit
Admission: RE | Admit: 2021-04-05 | Discharge: 2021-04-05 | Disposition: A | Discharge status: Home / Self Care | Payer: Medicare HMO | Source: Ambulatory Visit | Attending: Internal Medicine | Admitting: Internal Medicine

## 2021-04-05 DIAGNOSIS — F17200 Nicotine dependence, unspecified, uncomplicated: Secondary | ICD-10-CM | POA: Diagnosis not present

## 2021-04-05 DIAGNOSIS — F418 Other specified anxiety disorders: Secondary | ICD-10-CM | POA: Diagnosis not present

## 2021-04-05 DIAGNOSIS — G4701 Insomnia due to medical condition: Secondary | ICD-10-CM | POA: Diagnosis not present

## 2021-04-05 DIAGNOSIS — Z1231 Encounter for screening mammogram for malignant neoplasm of breast: Secondary | ICD-10-CM | POA: Diagnosis not present

## 2021-04-05 DIAGNOSIS — F331 Major depressive disorder, recurrent, moderate: Secondary | ICD-10-CM

## 2021-04-05 MED ORDER — HYDROXYZINE PAMOATE 25 MG PO CAPS: 25.0000 mg | ORAL_CAPSULE | Freq: Every evening | ORAL | 1 refills | Status: DC | PRN

## 2021-04-05 NOTE — Patient Instructions (Signed)
Hydroxyzine capsules or tablets What is this medicine? HYDROXYZINE (hye [place] i zeen) is an antihistamine. This medicine is used to treat allergy symptoms. It is also used to treat anxiety and tension. This medicine can be used with other medicines to induce sleep before surgery. This medicine may be used for other purposes; ask your health care provider or pharmacist if you have questions. COMMON BRAND NAME(S): ANX, Atarax, Rezine, Vistaril What should I tell my health care provider before I take this medicine? They need to know if you have any of these conditions:  glaucoma  heart disease  history of irregular heartbeat  kidney disease  liver disease  lung or breathing disease, like asthma  stomach or intestine problems  thyroid disease  trouble passing urine  an unusual or allergic reaction to hydroxyzine, cetirizine, other medicines, foods, dyes or preservatives  pregnant or trying to get pregnant  breast-feeding How should I use this medicine? Take this medicine by mouth with a full glass of water. Follow the directions on the prescription label. You may take this medicine with food or on an empty stomach. Take your medicine at regular intervals. Do not take your medicine more often than directed. Talk to your pediatrician regarding the use of this medicine in children. Special care may be needed. While this drug may be prescribed for children as young as 73 years of age for selected conditions, precautions do apply. Patients over 36 years old may have a stronger reaction and need a smaller dose. Overdosage: If you think you have taken too much of this medicine contact a poison control center or emergency room at once. NOTE: This medicine is only for you. Do not share this medicine with others. What if I miss a dose? If you miss a dose, take it as soon as you can. If it is almost time for your next dose, take only that dose. Do not take double or extra doses. What may  interact with this medicine? Do not take this medicine with any of the following medications:  cisapride  dronedarone  pimozide  thioridazine This medicine may also interact with the following medications:  alcohol  antihistamines for allergy, cough, and cold  atropine  barbiturate medicines for sleep or seizures, like phenobarbital  certain antibiotics like erythromycin or clarithromycin  certain medicines for anxiety or sleep  certain medicines for bladder problems like oxybutynin, tolterodine  certain medicines for depression or psychotic disturbances  certain medicines for irregular heart beat  certain medicines for Parkinson's disease like benztropine, trihexyphenidyl  certain medicines for seizures like phenobarbital, primidone  certain medicines for stomach problems like dicyclomine, hyoscyamine  certain medicines for travel sickness like scopolamine  ipratropium  narcotic medicines for pain  other medicines that prolong the QT interval (an abnormal heart rhythm) like dofetilide This list may not describe all possible interactions. Give your health care provider a list of all the medicines, herbs, non-prescription drugs, or dietary supplements you use. Also tell them if you smoke, drink alcohol, or use illegal drugs. Some items may interact with your medicine. What should I watch for while using this medicine? Tell your doctor or health care professional if your symptoms do not improve. You may get drowsy or dizzy. Do not drive, use machinery, or do anything that needs mental alertness until you know how this medicine affects you. Do not stand or sit up quickly, especially if you are an older patient. This reduces the risk of dizzy or fainting spells. Alcohol may  interfere with the effect of this medicine. Avoid alcoholic drinks. Your mouth may get dry. Chewing sugarless gum or sucking hard candy, and drinking plenty of water may help. Contact your doctor if the  problem does not go away or is severe. This medicine may cause dry eyes and blurred vision. If you wear contact lenses you may feel some discomfort. Lubricating drops may help. See your eye doctor if the problem does not go away or is severe. If you are receiving skin tests for allergies, tell your doctor you are using this medicine. What side effects may I notice from receiving this medicine? Side effects that you should report to your doctor or health care professional as soon as possible:  allergic reactions like skin rash, itching or hives, swelling of the face, lips, or tongue  changes in vision  confusion  fast, irregular heartbeat  seizures  tremor  trouble passing urine or change in the amount of urine Side effects that usually do not require medical attention (report to your doctor or health care professional if they continue or are bothersome):  constipation  drowsiness  dry mouth  headache  tiredness This list may not describe all possible side effects. Call your doctor for medical advice about side effects. You may report side effects to FDA at 1-800-FDA-1088. Where should I keep my medicine? Keep out of the reach of children. Store at room temperature between 15 and 30 degrees C (59 and 86 degrees F). Keep container tightly closed. Throw away any unused medicine after the expiration date. NOTE: This sheet is a summary. It may not cover all possible information. If you have questions about this medicine, talk to your doctor, pharmacist, or health care provider.  2021 Elsevier/Gold Standard (2018-11-02 13:19:55)  

## 2021-04-05 NOTE — Progress Notes (Signed)
Virtual Visit via Video Note  I connected with Kathryn Friedman on 04/05/21 at  1:30 PM EDT by a video enabled telemedicine application and verified that I am speaking with the correct person using two identifiers.  Location Provider Location : [facility] Patient Location : Home  Participants: Patient , Provider    I discussed the limitations of evaluation and management by telemedicine and the availability of in person appointments. The patient expressed understanding and agreed to proceed.  I discussed the assessment and treatment plan with the patient. The patient was provided an opportunity to ask questions and all were answered. The patient agreed with the plan and demonstrated an understanding of the instructions.   The patient was advised to call back or seek an in-person evaluation if the symptoms worsen or if the condition fails to improve as anticipated.   [place] MD OP Progress Note  04/05/2021 1:59 PM AMANADA PHILBRICK  MRN:  [id number]  Chief Complaint:  Chief Complaint    Follow-up; Anxiety; Insomnia     HPI: ARIYAN Friedman is a 65 year old Caucasian female, married, retired, on [employer], lives in [place], has a history of MDD, insomnia, anxiety disorder, tobacco use disorder was evaluated by telemedicine today.  Patient has multiple medical problems including obstructive sleep apnea on CPAP, eye surgery, coronary artery disease status post stent placement, COPD, diabetes melitis, hypertension, GERD, interstitial lung disease, iron deficiency, tobacco use disorder.  Patient today reports she is currently in a lot of pain.  She reports the pain is so severe and it is in her hip joint, which radiates down her legs to the back.  She reports she got an injection recently however that has not helped.  She is currently trying to manage her pain with Tylenol.  She does have upcoming appointment with her providers at [facility].  She reports she likely will need  surgery soon.  The pain does keep her up at night.  She reports the trazodone and the melatonin has not been very helpful.  She has been using CBD Gummies which seems to help her some.  Patient denies any suicidality, homicidality or perceptual disturbances.  She is anxious about her health and does worry a lot about it.  She however denies any depressive symptoms.  She reports appetite is fair.  She reports when she feels better with her pain she wants to quit smoking.  Patient denies any other concerns today.  Visit Diagnosis:    ICD-10-CM   1. Moderate episode of recurrent major depressive disorder (HCC)  F33.1   2. Insomnia due to medical condition  G47.01 hydrOXYzine (VISTARIL) 25 MG capsule  3. Other specified anxiety disorders  F41.8   4. Tobacco use disorder  F17.200     Past Psychiatric History: I have reviewed past psychiatric history from progress note on Mar 26, 2020.  Past trials of trazodone, Cymbalta, Lexapro, Rexulti.  Patient was under the care of [facility] in the past.  Past Medical History:  Past Medical History:  Diagnosis Date  . Anemia   . Arthritis    back and knees  . Asthma   . Blood in stool   . Chronic diarrhea   . COPD (chronic obstructive pulmonary disease) ([place])   . Coronary artery disease    s/p stent placement 06/25/06  . Depression    secondary to the death of her husband (died 51)  . Diabetes mellitus without complication ([place]) Diagnosed 05/2008  . Diverticulitis   .  Diverticulosis   . Dizzinesses   . Dysphagia   . Fatty infiltration of liver   . GERD (gastroesophageal reflux disease)   . Headache   . Hypertension   . Hypertriglyceridemia   . ILD (interstitial lung disease) ([place])   . Lower abdominal pain   . Lump in the abdomen   . PSVT (paroxysmal supraventricular tachycardia) ([place])   . Sleep apnea    on CPAP  . Spastic colon   . Tobacco abuse   . Venous insufficiency of both lower extremities     Past  Surgical History:  Procedure Laterality Date  . ABDOMINAL HYSTERECTOMY  with left ovary in place 1996  . APPENDECTOMY  1985  . BREAST BIOPSY Left 10/14/2017   calcs bx, fibrosis giant cell reaction and chronic inflammation, negative for malignancy.   Marland Kitchen CARDIAC CATHETERIZATION  2007   stents  . CATARACT EXTRACTION, BILATERAL    . CESAREAN SECTION  1984  . CHOLECYSTECTOMY  1985  . COLONOSCOPY WITH PROPOFOL N/A 09/13/2016   Procedure: COLONOSCOPY WITH PROPOFOL;  Surgeon: Manya Silvas, MD;  Location: [facility] ENDOSCOPY;  Service: Endoscopy;  Laterality: N/A;  . COLONOSCOPY WITH PROPOFOL N/A 11/09/2018   Procedure: COLONOSCOPY WITH PROPOFOL;  Surgeon: Manya Silvas, MD;  Location: [facility] ENDOSCOPY;  Service: Endoscopy;  Laterality: N/A;  . COLONOSCOPY WITH PROPOFOL N/A 03/29/2020   Procedure: COLONOSCOPY WITH PROPOFOL;  Surgeon: Robert Bellow, MD;  Location: [facility] ENDOSCOPY;  Service: Endoscopy;  Laterality: N/A;  . ESOPHAGOGASTRODUODENOSCOPY (EGD) WITH PROPOFOL N/A 02/02/2018   Procedure: ESOPHAGOGASTRODUODENOSCOPY (EGD) WITH PROPOFOL;  Surgeon: Manya Silvas, MD;  Location[facility] ENDOSCOPY;  Service: Endoscopy;  Laterality: N/A;  . ESOPHAGOGASTRODUODENOSCOPY (EGD) WITH PROPOFOL N/A 03/29/2020   Procedure: ESOPHAGOGASTRODUODENOSCOPY (EGD) WITH PROPOFOL;  Surgeon: Robert Bellow, MD;  Location: [facility] ENDOSCOPY;  Service: Endoscopy;  Laterality: N/A;  . JOINT REPLACEMENT     bilateral knee replacements  . KNEE ARTHROSCOPY  Arthroscopic left knee surgery   . KNEE SURGERY  status post knee surgey   . LEFT HEART CATH AND CORONARY ANGIOGRAPHY Left 05/14/2018   Procedure: LEFT HEART CATH AND CORONARY ANGIOGRAPHY;  Surgeon: Corey Skains, MD;  Location: [facility] CV LAB;  Service: Cardiovascular;  Laterality: Left;  . REPLACEMENT TOTAL KNEE  ([facility])  . SHOULDER SURGERY  shoulder operation secondary to a torn tendon    Family Psychiatric History: I have reviewed family psychiatric  history from progress note on 06/26/2020.  Family History:  Family History  Problem Relation Age of Onset  . Other Mother        Hit by a fire truck and has had multiple operations on her back , and has history of MVP   . Mitral valve prolapse Mother   . Lung cancer Mother   . Depression Mother   . Heart disease Father        myocardial infarction and is status post bypass surgery  . Mitral valve prolapse Sister   . Bipolar disorder Sister   . Hepatitis C Brother   . Cirrhosis Brother   . Colon cancer Paternal Aunt   . Breast cancer Neg Hx   . Prostate cancer Neg Hx   . Bladder Cancer Neg Hx   . Kidney cancer Neg Hx     Social History: I have reviewed social history from my progress note on 06/26/2020. Social History   Socioeconomic History  . Marital status: Married    Spouse name: Not on file  . Number  of children: 1  . Years of education: Not on file  . Highest education level: Not on file  Occupational History    Employer: [employer]  Tobacco Use  . Smoking status: Current Every Day Smoker    Packs/day: 1.50    Years: 45.00    Pack years: 67.50    Types: Cigarettes  . Smokeless tobacco: Never Used  Vaping Use  . Vaping Use: Former  Substance and Sexual Activity  . Alcohol use: No    Alcohol/week: 0.0 standard drinks  . Drug use: No  . Sexual activity: Not on file  Other Topics Concern  . Not on file  Social History Narrative  . Not on file   Social Determinants of Health   Financial Resource Strain: Medium Risk  . Difficulty of Paying Living Expenses: Somewhat hard  Food Insecurity: Not on file  Transportation Needs: Not on file  Physical Activity: Not on file  Stress: Stress Concern Present  . Feeling of Stress : Very much  Social Connections: Not on file    Allergies:  Allergies  Allergen Reactions  . Varenicline Other (See Comments)    "I got really depressed" "I got really depressed" CHANTEX  . Jardiance [Empagliflozin] Other (See Comments)     Yeast infection  . Methylprednisolone Nausea Only and Nausea And Vomiting    Metabolic Disorder Labs: Lab Results  Component Value Date   HGBA1C 7.1 (H) 03/30/2021   No results found for: PROLACTIN Lab Results  Component Value Date   CHOL 175 03/30/2021   TRIG 298.0 (H) 03/30/2021   HDL 41.70 03/30/2021   CHOLHDL 4 03/30/2021   VLDL 59.6 (H) 03/30/2021   LDLCALC 53 07/06/2020   LDLCALC 47 03/17/2020   Lab Results  Component Value Date   TSH 1.56 03/17/2020   TSH 1.42 12/02/2017    Therapeutic Level Labs: No results found for: LITHIUM No results found for: VALPROATE No components found for:  CBMZ  Current Medications: Current Outpatient Medications  Medication Sig Dispense Refill  . colestipol (COLESTID) 1 g tablet Take by mouth.    . hydrOXYzine (VISTARIL) 25 MG capsule Take 1 capsule (25 mg total) by mouth at bedtime as needed. For sleep 30 capsule 1  . methylPREDNISolone (MEDROL DOSEPAK) 4 MG TBPK tablet See admin instructions.    Marland Kitchen albuterol (VENTOLIN HFA) 108 (90 Base) MCG/ACT inhaler INHALE 2 PUFFS FOUR TIMES A DAY 8.5 g 11  . amLODipine (NORVASC) 5 MG tablet Take 1 tablet (5 mg total) by mouth daily. 90 tablet 1  . aspirin 81 MG tablet Take 81 mg by mouth daily.     . Blood Glucose Monitoring Suppl (TRUE METRIX METER) w/Device KIT     . Budeson-Glycopyrrol-Formoterol (BREZTRI AEROSPHERE) 160-9-4.8 MCG/ACT AERO Inhale 2 puffs into the lungs in the morning and at bedtime. 32.1 g 3  . CALCIUM PO Take 600 mg by mouth daily.     . clopidogrel (PLAVIX) 75 MG tablet Take 1 tablet (75 mg total) by mouth daily. 90 tablet 0  . cyclobenzaprine (FLEXERIL) 10 MG tablet TAKE 1 TABLET THREE TIMES DAILY AS NEEDED FOR MUSCLE SPASM(S) 270 tablet 1  . diclofenac (VOLTAREN) 75 MG EC tablet Take by mouth.    . DULoxetine (CYMBALTA) 60 MG capsule Take 1 capsule (60 mg total) by mouth daily. 90 capsule 1  . famotidine (PEPCID) 40 MG tablet Take 1 tablet (40 mg total) by mouth daily. 90  tablet 1  . glucose blood test strip Use  as instructed to check blood sugars twice daily. Dx E11.9. 100 each 12  . isosorbide mononitrate (IMDUR) 30 MG 24 hr tablet Take 1 tablet (30 mg total) by mouth daily. 30 tablet 12  . L-FORMULA LYSINE HCL PO Take by mouth.    . lamoTRIgine (LAMICTAL) 25 MG tablet TAKE 1 TABLET EVERY DAY FOR MOOD 90 tablet 0  . metFORMIN (GLUCOPHAGE-XR) 500 MG 24 hr tablet Take 2 tablets (1,000 mg total) by mouth in the morning and at bedtime. 360 tablet 1  . Multiple Vitamin (MULTIVITAMIN PO) Take 1 tablet by mouth daily.    . mupirocin ointment (BACTROBAN) 2 % Apply to affected area on abdomen bid 22 g 0  . nystatin (MYCOSTATIN/NYSTOP) powder APPLY TO THE AFFECTED AREA(S) TWICE DAILY 15 g 0  . oxybutynin (DITROPAN-XL) 10 MG 24 hr tablet Take 1 tablet (10 mg total) by mouth daily. 90 tablet 0  . pantoprazole (PROTONIX) 40 MG tablet TAKE 1 TABLET TWICE DAILY BEFORE MEALS 180 tablet 1  . PFIZER-BIONT COVID-19 VAC-TRIS SUSP injection     . Potassium 99 MG TABS Take by mouth.    . propranolol (INDERAL) 40 MG tablet 1 tab daily PO 180 tablet 1  . rosuvastatin (CRESTOR) 20 MG tablet Take 1 tablet (20 mg total) by mouth daily. 90 tablet 1  . telmisartan (MICARDIS) 80 MG tablet Take 1 tablet (80 mg total) by mouth daily. 90 tablet 1  . traZODone (DESYREL) 100 MG tablet Take 2 tablets (200 mg total) by mouth at bedtime. 180 tablet 1  . TRUEplus Lancets 33G MISC      No current facility-administered medications for this visit.     Musculoskeletal: Strength & Muscle Tone: UTA Gait & Station: UTA Patient leans: N/A  Psychiatric Specialty Exam: Review of Systems  Musculoskeletal:       Hip joint pain,radiating down to leg  Psychiatric/Behavioral: Positive for sleep disturbance. The patient is nervous/anxious.   All other systems reviewed and are negative.   There were no vitals taken for this visit.There is no height or weight on file to calculate BMI.  General  Appearance: Casual  Eye Contact:  Fair  Speech:  Clear and Coherent  Volume:  Normal  Mood:  Anxious  Affect:  Congruent  Thought Process:  Goal Directed and Descriptions of Associations: Intact  Orientation:  Full (Time, Place, and Person)  Thought Content: Logical   Suicidal Thoughts:  No  Homicidal Thoughts:  No  Memory:  Immediate;   Fair Recent;   Fair Remote;   Fair  Judgement:  Fair  Insight:  Fair  Psychomotor Activity:  Normal  Concentration:  Concentration: Fair and Attention Span: Fair  Recall:  [employer] of Knowledge: Fair  Language: Fair  Akathisia:  No  Handed:  Right  AIMS (if indicated): UTA  Assets:  Communication Skills Desire for Improvement Housing Social Support  ADL's:  Intact  Cognition: WNL  Sleep:  Poor   Screenings: GAD-7   Flowsheet Row Video Visit from 04/05/2021 in [place]  Total GAD-7 Score 9    PHQ2-9   Flowsheet Row Video Visit from 04/05/2021 in [place] Video Visit from 01/31/2021 in [facility] Video Visit from 01/04/2021 in [facility] Office Visit from 07/20/2020 in [facility] Office Visit from 05/07/2019 in [facility]  PHQ-2 Total Score '2 1 1 ' 0 2  PHQ-9 Total Score 6 5 -- 3 2  Flowsheet Row Video Visit from 01/31/2021 in [facility] Video Visit from 01/04/2021 in [facility] Admission (Discharged) from 11/09/2018 in [facility] ENDOSCOPY  C-SSRS RISK CATEGORY Low Risk No Risk No Risk       Assessment and Plan: GALE KLAR is a 65 year old Caucasian female who has a history of MDD, anxiety disorder, sleep problems, multiple medical problems was evaluated by telemedicine today.  Patient with psychosocial stressors of the pandemic, recent surgery, pain, relationship struggles.  She will  benefit from the following plan since she continues to struggle with pain which does have an impact on her mood and sleep.  Plan MDD in remission Cymbalta 60 mg p.o. daily Lamictal 25 mg p.o. daily Continue CBT  Other specified anxiety disorder- unstable Continue CBT Anxiety symptoms more so because of her pain. Cymbalta 60 mg p.o. daily   Insomnia-unstable Likely due to pain.  She will benefit from sufficient pain management Trazodone 100 to 200 mg p.o. nightly as needed Start hydroxyzine 25 mg p.o. nightly as needed She also has melatonin available Continue CPAP  Tobacco use disorder- improving Provided counseling.  Provided education about CBD, patient is aware about drug to drug interaction and limited research.  Follow-up in clinic in 4 weeks or sooner if needed.  This note was generated in part or whole with voice recognition software. Voice recognition is usually quite accurate but there are transcription errors that can and very often do occur. I apologize for any typographical errors that were not detected and corrected.       Ursula Alert, MD 04/05/2021, 7:07 PM

## 2021-04-09 ENCOUNTER — Encounter: Payer: Self-pay | Admitting: Internal Medicine

## 2021-04-09 NOTE — Assessment & Plan Note (Signed)
Discussed the need to quit smoking.  Declines to quit.

## 2021-04-09 NOTE — Assessment & Plan Note (Signed)
Continue metformin.  Low carb diet and exercise.  Follow met b and a1c.  

## 2021-04-09 NOTE — Assessment & Plan Note (Signed)
Seeing psychiatry.  Doing well on current regimen.  Taking trazodone and cymbalta.

## 2021-04-09 NOTE — Assessment & Plan Note (Signed)
Continue symbicort.  Non increased cough or congestion.  Follow. Plan cardiac w/up as outlined.

## 2021-04-09 NOTE — Assessment & Plan Note (Addendum)
S/p stent placement.  Continue lipitor.  Given chest heaviness and sob, discussed EKG.  She declined.  Sees cardiology. Just had echo as outlined.  Discussed f/u with cardiology for further evaluation and w/up.  She did not have her stress test they ordered.  Did not reschedule.  Called cardiology.  They informed me she would need to call and reschedule.  Symptoms - stable from her visit, but given the chest heaviness and sob, I do feel she needs to complete the cardiac w/up.  She is agreeable. Discussed if any worsening symptoms or recurring symptoms, she is to be evaluated.

## 2021-04-09 NOTE — Assessment & Plan Note (Signed)
Continue on symbicort.  No increased cough or congestion.  Follow.

## 2021-04-09 NOTE — Assessment & Plan Note (Signed)
Blood pressure as outlined.  Continue micardis 80mg  q day.  Follow pressures.  Follow metabolic panel.

## 2021-04-09 NOTE — Assessment & Plan Note (Addendum)
On lipitor. Discussed labs. Change to crestor 20mg  q day.  Low cholesterol diet and exercise.  Follow lipid panel and liver function tests.

## 2021-04-09 NOTE — Assessment & Plan Note (Signed)
Documented history.  No increased heart rate reported.  Plan f/u with cardiology as outlined.

## 2021-04-09 NOTE — Assessment & Plan Note (Signed)
Follow cbc and iron studies.  

## 2021-04-09 NOTE — Assessment & Plan Note (Signed)
No upper symptoms reported.  On protonix.   

## 2021-04-09 NOTE — Assessment & Plan Note (Signed)
Continue lipitor  ?

## 2021-04-09 NOTE — Assessment & Plan Note (Signed)
CPAP.  

## 2021-04-10 NOTE — Telephone Encounter (Signed)
PT called to inform that she talk to [facility] and they stated they will need a PA from [place] for the Oxybutin

## 2021-04-12 ENCOUNTER — Other Ambulatory Visit: Payer: Self-pay | Admitting: Internal Medicine

## 2021-04-12 NOTE — Telephone Encounter (Signed)
LMTCB

## 2021-04-12 NOTE — Telephone Encounter (Signed)
Pt called to let me know that she is not getting rx from [facility]. She is going to get it from [facility] when she needs is because it is cheaper. Will let me know when she needs rx

## 2021-04-12 NOTE — Telephone Encounter (Signed)
PT called back to return phone call.

## 2021-04-13 ENCOUNTER — Ambulatory Visit: Payer: Medicare PPO | Admitting: Urology

## 2021-04-13 ENCOUNTER — Other Ambulatory Visit: Payer: Self-pay | Admitting: Internal Medicine

## 2021-04-16 DIAGNOSIS — M48062 Spinal stenosis, lumbar region with neurogenic claudication: Secondary | ICD-10-CM | POA: Diagnosis not present

## 2021-04-16 DIAGNOSIS — M5416 Radiculopathy, lumbar region: Secondary | ICD-10-CM | POA: Diagnosis not present

## 2021-04-17 ENCOUNTER — Telehealth: Payer: Self-pay | Admitting: Internal Medicine

## 2021-04-17 NOTE — Telephone Encounter (Signed)
If she is taking and tolerating crestor - I would prefer her to stay on crestor.  Can correct med list if needed.

## 2021-04-17 NOTE — Telephone Encounter (Signed)
Patient returned [name]'s phone call

## 2021-04-17 NOTE — Telephone Encounter (Signed)
Patient has both at home atorvastatin 20 mg and rosuvastatin 20 mg at home. She has been taking rosuvastatin 20 mg and that is the most recent prescription. Last office note says continue lipitor. Just wanted to clarify which one you want her to continue

## 2021-04-17 NOTE — Telephone Encounter (Signed)
Patient called in wanted to know if Dr.Scott has taken her off atorvastatin

## 2021-04-17 NOTE — Telephone Encounter (Signed)
Patient was advised of Dr Lars Mage message below. Will continue crestor and med list is correct

## 2021-04-17 NOTE — Telephone Encounter (Signed)
LMTCB

## 2021-04-23 ENCOUNTER — Telehealth: Payer: Self-pay | Admitting: Internal Medicine

## 2021-04-23 NOTE — Telephone Encounter (Signed)
Per last visit, I had called cardiology to reschedule Kathryn Friedman's stress test.  Was informed she would need to call and schedule.  She was informed of this at her appt.  I do not see where this has been scheduled.  Please contact her and confirm if she rescheduled.  If not, she needs to reschedule.  Thanks.

## 2021-04-24 ENCOUNTER — Telehealth: Payer: Medicare HMO

## 2021-04-24 NOTE — Telephone Encounter (Signed)
She is rescheduled for the stress test 05/01/21

## 2021-04-26 DIAGNOSIS — M1612 Unilateral primary osteoarthritis, left hip: Secondary | ICD-10-CM | POA: Diagnosis not present

## 2021-04-28 ENCOUNTER — Other Ambulatory Visit: Payer: Self-pay | Admitting: Psychiatry

## 2021-04-28 DIAGNOSIS — G4701 Insomnia due to medical condition: Secondary | ICD-10-CM

## 2021-05-01 DIAGNOSIS — R079 Chest pain, unspecified: Secondary | ICD-10-CM | POA: Diagnosis not present

## 2021-05-01 DIAGNOSIS — R0602 Shortness of breath: Secondary | ICD-10-CM | POA: Diagnosis not present

## 2021-05-02 ENCOUNTER — Ambulatory Visit (INDEPENDENT_AMBULATORY_CARE_PROVIDER_SITE_OTHER): Payer: Medicare HMO

## 2021-05-02 VITALS — Ht 64.0 in | Wt 223.0 lb

## 2021-05-02 DIAGNOSIS — Z1159 Encounter for screening for other viral diseases: Secondary | ICD-10-CM | POA: Diagnosis not present

## 2021-05-02 DIAGNOSIS — Z0000 Encounter for general adult medical examination without abnormal findings: Secondary | ICD-10-CM | POA: Diagnosis not present

## 2021-05-02 DIAGNOSIS — Z780 Asymptomatic menopausal state: Secondary | ICD-10-CM

## 2021-05-02 NOTE — Patient Instructions (Addendum)
Kathryn Friedman , Thank you for taking time to come for your Medicare Wellness Visit. I appreciate your ongoing commitment to your health goals. Please review the following plan we discussed and let me know if I can assist you in the future.   These are the goals we discussed: Goals      Patient Stated   .  Medication Monitoring (pt-stated)      Patient Goals/Self-Care Activities . Over the next 90 days, patient will:  - take medications as prescribed collaborate with provider on medication access solutions            Other   .  Follow up with Primary Care Provider      As needed       This is a list of the screening recommended for you and due dates:  Health Maintenance  Topic Date Due  . HIV Screening  Never done  . Hepatitis C Screening: USPSTF Recommendation to screen - Ages 102-79 yo.  Never done  . DEXA scan (bone density measurement)  Never done  . Pneumococcal Vaccination (1 of 4 - PCV13) 06/25/2021*  . Pneumonia vaccines (1 of 2 - PCV13) 06/25/2021*  . Zoster (Shingles) Vaccine (1 of 2) 08/02/2021*  . COVID-19 Vaccine (4 - Booster for Pfizer series) 05/12/2021  . Flu Shot  06/25/2021  . Hemoglobin A1C  09/30/2021  . Eye exam for diabetics  11/06/2021  . Complete foot exam   04/03/2022  . Mammogram  04/05/2022  . Tetanus Vaccine  04/21/2028  . Colon Cancer Screening  03/29/2030  . HPV Vaccine  Aged Out  . Pap Smear  Discontinued  *Topic was postponed. The date shown is not the original due date.   Advanced directives: not yet completed  Conditions/risks identified: none new  Follow up in one year for your annual wellness visit    Preventive Care 65 Years and Older, Female Preventive care refers to lifestyle choices and visits with your health care provider that can promote health and wellness. What does preventive care include?  A yearly physical exam. This is also called an annual well check.  Dental exams once or twice a year.  Routine eye  exams. Ask your health care provider how often you should have your eyes checked.  Personal lifestyle choices, including:  Daily care of your teeth and gums.  Regular physical activity.  Eating a healthy diet.  Avoiding tobacco and drug use.  Limiting alcohol use.  Practicing safe sex.  Taking low-dose aspirin every day.  Taking vitamin and mineral supplements as recommended by your health care provider. What happens during an annual well check? The services and screenings done by your health care provider during your annual well check will depend on your age, overall health, lifestyle risk factors, and family history of disease. Counseling  Your health care provider may ask you questions about your:  Alcohol use.  Tobacco use.  Drug use.  Emotional well-being.  Home and relationship well-being.  Sexual activity.  Eating habits.  History of falls.  Memory and ability to understand (cognition).  Work and work Statistician.  Reproductive health. Screening  You may have the following tests or measurements:  Height, weight, and BMI.  Blood pressure.  Lipid and cholesterol levels. These may be checked every 5 years, or more frequently if you are over 1 years old.  Skin check.  Lung cancer screening. You may have this screening every year starting at age 42 if you have a 30-pack-year  history of smoking and currently smoke or have quit within the past 15 years.  Fecal occult blood test (FOBT) of the stool. You may have this test every year starting at age 79.  Flexible sigmoidoscopy or colonoscopy. You may have a sigmoidoscopy every 5 years or a colonoscopy every 10 years starting at age 56.  Hepatitis C blood test.  Hepatitis B blood test.  Sexually transmitted disease (STD) testing.  Diabetes screening. This is done by checking your blood sugar (glucose) after you have not eaten for a while (fasting). You may have this done every 1-3 years.  Bone  density scan. This is done to screen for osteoporosis. You may have this done starting at age 25.  Mammogram. This may be done every 1-2 years. Talk to your health care provider about how often you should have regular mammograms. Talk with your health care provider about your test results, treatment options, and if necessary, the need for more tests. Vaccines  Your health care provider may recommend certain vaccines, such as:  Influenza vaccine. This is recommended every year.  Tetanus, diphtheria, and acellular pertussis (Tdap, Td) vaccine. You may need a Td booster every 10 years.  Zoster vaccine. You may need this after age 4.  Pneumococcal 13-valent conjugate (PCV13) vaccine. One dose is recommended after age 65.  Pneumococcal polysaccharide (PPSV23) vaccine. One dose is recommended after age 75. Talk to your health care provider about which screenings and vaccines you need and how often you need them. This information is not intended to replace advice given to you by your health care provider. Make sure you discuss any questions you have with your health care provider. Document Released: 12/08/2015 Document Revised: 07/31/2016 Document Reviewed: 09/12/2015 Elsevier Interactive Patient Education  2017 [employer] Prevention in the Home Falls can cause injuries. They can happen to people of all ages. There are many things you can do to make your home safe and to help prevent falls. What can I do on the outside of my home?  Regularly fix the edges of walkways and driveways and fix any cracks.  Remove anything that might make you trip as you walk through a door, such as a raised step or threshold.  Trim any bushes or trees on the path to your home.  Use bright outdoor lighting.  Clear any walking paths of anything that might make someone trip, such as rocks or tools.  Regularly check to see if handrails are loose or broken. Make sure that both sides of any steps have  handrails.  Any raised decks and porches should have guardrails on the edges.  Have any leaves, snow, or ice cleared regularly.  Use sand or salt on walking paths during winter.  Clean up any spills in your garage right away. This includes oil or grease spills. What can I do in the bathroom?  Use night lights.  Install grab bars by the toilet and in the tub and shower. Do not use towel bars as grab bars.  Use non-skid mats or decals in the tub or shower.  If you need to sit down in the shower, use a plastic, non-slip stool.  Keep the floor dry. Clean up any water that spills on the floor as soon as it happens.  Remove soap buildup in the tub or shower regularly.  Attach bath mats securely with double-sided non-slip rug tape.  Do not have throw rugs and other things on the floor that can make you trip.  What can I do in the bedroom?  Use night lights.  Make sure that you have a light by your bed that is easy to reach.  Do not use any sheets or blankets that are too big for your bed. They should not hang down onto the floor.  Have a firm chair that has side arms. You can use this for support while you get dressed.  Do not have throw rugs and other things on the floor that can make you trip. What can I do in the kitchen?  Clean up any spills right away.  Avoid walking on wet floors.  Keep items that you use a lot in easy-to-reach places.  If you need to reach something above you, use a strong step stool that has a grab bar.  Keep electrical cords out of the way.  Do not use floor polish or wax that makes floors slippery. If you must use wax, use non-skid floor wax.  Do not have throw rugs and other things on the floor that can make you trip. What can I do with my stairs?  Do not leave any items on the stairs.  Make sure that there are handrails on both sides of the stairs and use them. Fix handrails that are broken or loose. Make sure that handrails are as long as  the stairways.  Check any carpeting to make sure that it is firmly attached to the stairs. Fix any carpet that is loose or worn.  Avoid having throw rugs at the top or bottom of the stairs. If you do have throw rugs, attach them to the floor with carpet tape.  Make sure that you have a light switch at the top of the stairs and the bottom of the stairs. If you do not have them, ask someone to add them for you. What else can I do to help prevent falls?  Wear shoes that:  Do not have high heels.  Have rubber bottoms.  Are comfortable and fit you well.  Are closed at the toe. Do not wear sandals.  If you use a stepladder:  Make sure that it is fully opened. Do not climb a closed stepladder.  Make sure that both sides of the stepladder are locked into place.  Ask someone to hold it for you, if possible.  Clearly mark and make sure that you can see:  Any grab bars or handrails.  First and last steps.  Where the edge of each step is.  Use tools that help you move around (mobility aids) if they are needed. These include:  Canes.  Walkers.  Scooters.  Crutches.  Turn on the lights when you go into a dark area. Replace any light bulbs as soon as they burn out.  Set up your furniture so you have a clear path. Avoid moving your furniture around.  If any of your floors are uneven, fix them.  If there are any pets around you, be aware of where they are.  Review your medicines with your doctor. Some medicines can make you feel dizzy. This can increase your chance of falling. Ask your doctor what other things that you can do to help prevent falls. This information is not intended to replace advice given to you by your health care provider. Make sure you discuss any questions you have with your health care provider. Document Released: 09/07/2009 Document Revised: 04/18/2016 Document Reviewed: 12/16/2014 Elsevier Interactive Patient Education  2017 [employer].
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
## 2021-05-03 ENCOUNTER — Telehealth (INDEPENDENT_AMBULATORY_CARE_PROVIDER_SITE_OTHER): Payer: Medicare HMO | Admitting: Psychiatry

## 2021-05-03 ENCOUNTER — Encounter: Payer: Self-pay | Admitting: Psychiatry

## 2021-05-03 ENCOUNTER — Other Ambulatory Visit: Payer: Self-pay

## 2021-05-03 DIAGNOSIS — F418 Other specified anxiety disorders: Secondary | ICD-10-CM

## 2021-05-03 DIAGNOSIS — F17200 Nicotine dependence, unspecified, uncomplicated: Secondary | ICD-10-CM

## 2021-05-03 DIAGNOSIS — G4701 Insomnia due to medical condition: Secondary | ICD-10-CM | POA: Diagnosis not present

## 2021-05-03 DIAGNOSIS — F3342 Major depressive disorder, recurrent, in full remission: Secondary | ICD-10-CM

## 2021-05-03 MED ORDER — GABAPENTIN 300 MG PO CAPS: 300.0000 mg | ORAL_CAPSULE | Freq: Every day | ORAL | 1 refills | Status: DC

## 2021-05-03 NOTE — Progress Notes (Signed)
Virtual Visit via Video Note  I connected with Kathryn Friedman on 05/03/21 at 10:20 AM EDT by a video enabled telemedicine application and verified that I am speaking with the correct person using two identifiers.  Location Provider Location : Office Patient Location : Home  Participants: Patient , Provider   I discussed the limitations of evaluation and management by telemedicine and the availability of in person appointments. The patient expressed understanding and agreed to proceed.    I discussed the assessment and treatment plan with the patient. The patient was provided an opportunity to ask questions and all were answered. The patient agreed with the plan and demonstrated an understanding of the instructions.   The patient was advised to call back or seek an in-person evaluation if the symptoms worsen or if the condition fails to improve as anticipated.   [place] MD OP Progress Note  05/03/2021 10:54 AM Kathryn Friedman  MRN:  [id number]  Chief Complaint:  Chief Complaint   Follow-up; Anxiety; Depression; Insomnia    HPI: Kathryn Friedman is a 65 year old Caucasian female, married, retired, on [employer], lives in [place], has a history of MDD, insomnia, anxiety disorder, tobacco use disorder was evaluated by telemedicine today.  Patient with multiple medical problems including obstructive sleep apnea on CPAP, eye surgery, coronary artery disease status post stent placement, COPD, diabetes mellitus, hypertension, GERD, interstitial lung disease, iron deficiency, tobacco use disorder.  Patient today reports she continues to struggle with hip joint pain.  It is on her left side and she currently rates it high.  She reports she has upcoming appointment with her providers and will probably need a surgery soon.  The pain does have an impact on her mood, makes her more anxious.  She also reports sleep is restless due to the pain.  She reports she is currently taking the  trazodone 200 mg and melatonin however due to the pain it does not seem to be that effective.  Patient denies any significant depressive symptoms.  She denies any suicidality, homicidality or perceptual disturbances.  She is not interested in smoking cessation yet.  Provided counseling.  Patient denies any other concerns today.   Visit Diagnosis:    ICD-10-CM   1. MDD (major depressive disorder), recurrent, in full remission ([place])  F33.42     2. Insomnia due to medical condition  G47.01 gabapentin (NEURONTIN) 300 MG capsule    3. Other specified anxiety disorders  F41.8 gabapentin (NEURONTIN) 300 MG capsule    4. Tobacco use disorder  F17.200       Past Psychiatric History: I have reviewed past psychiatric history from progress note on 06/26/2020.  Past trials of trazodone, Cymbalta, Lexapro, Rexulti.  Patient was under the care of [facility] in the past.  Past Medical History:  Past Medical History:  Diagnosis Date   Anemia    Arthritis    back and knees   Asthma    Blood in stool    Chronic diarrhea    COPD (chronic obstructive pulmonary disease) ([place])    Coronary artery disease    s/p stent placement 06/25/06   Depression    secondary to the death of her husband (died 68)   Diabetes mellitus without complication ([place]) Diagnosed 05/2008   Diverticulitis    Diverticulosis    Dizzinesses    Dysphagia    Fatty infiltration of liver    GERD (gastroesophageal reflux disease)    Headache    Hypertension  Hypertriglyceridemia    ILD (interstitial lung disease) ([place])    Lower abdominal pain    Lump in the abdomen    PSVT (paroxysmal supraventricular tachycardia) (H[facility])    Sleep apnea    on CPAP   Spastic colon    Tobacco abuse    Venous insufficiency of both lower extremities     Past Surgical History:  Procedure Laterality Date   ABDOMINAL HYSTERECTOMY  with left ovary in place [place] Left 10/14/2017   calcs  bx, fibrosis giant cell reaction and chronic inflammation, negative for malignancy.    CARDIAC CATHETERIZATION  2007   stents   CATARACT EXTRACTION, BILATERAL     CESAREAN SECTION  1984   CHOLECYSTECTOMY  1985   COLONOSCOPY WITH PROPOFOL N/A 09/13/2016   Procedure: COLONOSCOPY WITH PROPOFOL;  Surgeon: Manya Silvas, MD;  Location: [facility] ENDOSCOPY;  Service: Endoscopy;  Laterality: N/A;   COLONOSCOPY WITH PROPOFOL N/A 11/09/2018   Procedure: COLONOSCOPY WITH PROPOFOL;  Surgeon: Manya Silvas, MD;  Location: [facility] ENDOSCOPY;  Service: Endoscopy;  Laterality: N/A;   COLONOSCOPY WITH PROPOFOL N/A 03/29/2020   Procedure: COLONOSCOPY WITH PROPOFOL;  Surgeon: Robert Bellow, MD;  Location: [facility] ENDOSCOPY;  Service: Endoscopy;  Laterality: N/A;   ESOPHAGOGASTRODUODENOSCOPY (EGD) WITH PROPOFOL N/A 02/02/2018   Procedure: ESOPHAGOGASTRODUODENOSCOPY (EGD) WITH PROPOFOL;  Surgeon: Manya Silvas, MD;  Location: [facility] ENDOSCOPY;  Service: Endoscopy;  Laterality: N/A;   ESOPHAGOGASTRODUODENOSCOPY (EGD) WITH PROPOFOL N/A 03/29/2020   Procedure: ESOPHAGOGASTRODUODENOSCOPY (EGD) WITH PROPOFOL;  Surgeon: Robert Bellow, MD;  Location: [facility] ENDOSCOPY;  Service: Endoscopy;  Laterality: N/A;   JOINT REPLACEMENT     bilateral knee replacements   KNEE ARTHROSCOPY  Arthroscopic left knee surgery    KNEE SURGERY  status post knee surgey    LEFT HEART CATH AND CORONARY ANGIOGRAPHY Left 05/14/2018   Procedure: LEFT HEART CATH AND CORONARY ANGIOGRAPHY;  Surgeon: Corey Skains, MD;  Location: [facility] CV LAB;  Service: Cardiovascular;  Laterality: Left;   REPLACEMENT TOTAL KNEE  ([facility])   SHOULDER SURGERY  shoulder operation secondary to a torn tendon    Family Psychiatric History: I have reviewed family psychiatric history from progress note on 06/26/2020  Family History:  Family History  Problem Relation Age of Onset   Other Mother        Hit by a fire truck and has had multiple operations on her  back , and has history of MVP    Mitral valve prolapse Mother    Lung cancer Mother    Depression Mother    Heart disease Father        myocardial infarction and is status post bypass surgery   Mitral valve prolapse Sister    Bipolar disorder Sister    Hepatitis C Brother    Cirrhosis Brother    Colon cancer Paternal Aunt    Breast cancer Neg Hx    Prostate cancer Neg Hx    Bladder Cancer Neg Hx    Kidney cancer Neg Hx     Social History: I have reviewed social history from progress note on 06/26/2020 Social History   Socioeconomic History   Marital status: Married    Spouse name: Not on file   Number of children: 1   Years of education: Not on file   Highest education level: Not on file  Occupational History    Employer: [employer]  Tobacco Use   Smoking status: Every  Day    Packs/day: 1.50    Years: 45.00    Pack years: 67.50    Types: Cigarettes   Smokeless tobacco: Never  Vaping Use   Vaping Use: Former  Substance and Sexual Activity   Alcohol use: No    Alcohol/week: 0.0 standard drinks   Drug use: No   Sexual activity: Not on file  Other Topics Concern   Not on file  Social History Narrative   Not on file   Social Determinants of Health   Financial Resource Strain: Medium Risk   Difficulty of Paying Living Expenses: Somewhat hard  Food Insecurity: No Food Insecurity   Worried About Charity fundraiser in the Last Year: Never true   Ran Out of Food in the Last Year: Never true  Transportation Needs: No Transportation Needs   Lack of Transportation (Medical): No   Lack of Transportation (Non-Medical): No  Physical Activity: Not on file  Stress: No Stress Concern Present   Feeling of Stress : Only a little  Social Connections: Unknown   Frequency of Communication with Friends and Family: Not on file   Frequency of Social Gatherings with Friends and Family: Not on file   Attends Religious Services: Not on file   Active Member of Clubs or Organizations: Not on  file   Attends Archivist Meetings: Not on file   Marital Status: Married    Allergies:  Allergies  Allergen Reactions   Varenicline Other (See Comments)    "I got really depressed" "I got really depressed" CHANTEX   Jardiance [Empagliflozin] Other (See Comments)    Yeast infection   Methylprednisolone Nausea Only and Nausea And Vomiting    Metabolic Disorder Labs: Lab Results  Component Value Date   HGBA1C 7.1 (H) 03/30/2021   No results found for: PROLACTIN Lab Results  Component Value Date   CHOL 175 03/30/2021   TRIG 298.0 (H) 03/30/2021   HDL 41.70 03/30/2021   CHOLHDL 4 03/30/2021   VLDL 59.6 (H) 03/30/2021   LDLCALC 53 07/06/2020   LDLCALC 47 03/17/2020   Lab Results  Component Value Date   TSH 1.56 03/17/2020   TSH 1.42 12/02/2017    Therapeutic Level Labs: No results found for: LITHIUM No results found for: VALPROATE No components found for:  CBMZ  Current Medications: Current Outpatient Medications  Medication Sig Dispense Refill   albuterol (VENTOLIN HFA) 108 (90 Base) MCG/ACT inhaler INHALE 2 PUFFS FOUR TIMES A DAY 8.5 g 11   amLODipine (NORVASC) 5 MG tablet Take 1 tablet (5 mg total) by mouth daily. 90 tablet 1   aspirin 81 MG tablet Take 81 mg by mouth daily.      Blood Glucose Monitoring Suppl (TRUE METRIX METER) w/Device KIT      Budeson-Glycopyrrol-Formoterol (BREZTRI AEROSPHERE) 160-9-4.8 MCG/ACT AERO Inhale 2 puffs into the lungs in the morning and at bedtime. 32.1 g 3   CALCIUM PO Take 600 mg by mouth daily.      clopidogrel (PLAVIX) 75 MG tablet TAKE 1 TABLET EVERY DAY 90 tablet 0   cyclobenzaprine (FLEXERIL) 10 MG tablet TAKE 1 TABLET THREE TIMES DAILY AS NEEDED FOR MUSCLE SPASM(S) 270 tablet 1   diclofenac (VOLTAREN) 75 MG EC tablet Take by mouth.     DULoxetine (CYMBALTA) 60 MG capsule Take 1 capsule (60 mg total) by mouth daily. 90 capsule 1   famotidine (PEPCID) 40 MG tablet TAKE 1 TABLET (40 MG TOTAL) BY MOUTH DAILY. 90  tablet 1  gabapentin (NEURONTIN) 300 MG capsule Take 1 capsule (300 mg total) by mouth at bedtime. 30 capsule 1   glucose blood test strip Use as instructed to check blood sugars twice daily. Dx E11.9. 100 each 12   hydrOXYzine (VISTARIL) 25 MG capsule TAKE 1 CAPSULE (25 MG TOTAL) BY MOUTH AT BEDTIME AS NEEDED. FOR SLEEP 90 capsule 1   isosorbide mononitrate (IMDUR) 30 MG 24 hr tablet TAKE 1 TABLET EVERY DAY 90 tablet 1   L-FORMULA LYSINE HCL PO Take by mouth.     lamoTRIgine (LAMICTAL) 25 MG tablet TAKE 1 TABLET EVERY DAY FOR MOOD 90 tablet 0   metFORMIN (GLUCOPHAGE-XR) 500 MG 24 hr tablet Take 2 tablets (1,000 mg total) by mouth in the morning and at bedtime. 360 tablet 1   Multiple Vitamin (MULTIVITAMIN PO) Take 1 tablet by mouth daily.     mupirocin ointment (BACTROBAN) 2 % Apply to affected area on abdomen bid 22 g 0   nystatin (MYCOSTATIN/NYSTOP) powder APPLY TO THE AFFECTED AREA(S) TWICE DAILY 15 g 0   oxybutynin (DITROPAN-XL) 10 MG 24 hr tablet Take 1 tablet (10 mg total) by mouth daily. 90 tablet 0   pantoprazole (PROTONIX) 40 MG tablet TAKE 1 TABLET TWICE DAILY BEFORE MEALS 180 tablet 1   PFIZER-BIONT COVID-19 VAC-TRIS SUSP injection      Potassium 99 MG TABS Take by mouth.     propranolol (INDERAL) 40 MG tablet 1 tab daily PO 180 tablet 1   rosuvastatin (CRESTOR) 20 MG tablet Take 1 tablet (20 mg total) by mouth daily. 90 tablet 1   telmisartan (MICARDIS) 80 MG tablet Take 1 tablet (80 mg total) by mouth daily. 90 tablet 1   traZODone (DESYREL) 100 MG tablet Take 2 tablets (200 mg total) by mouth at bedtime. 180 tablet 1   TRUEplus Lancets 33G MISC      colestipol (COLESTID) 1 g tablet Take by mouth. (Patient not taking: Reported on 05/03/2021)     methylPREDNISolone (MEDROL DOSEPAK) 4 MG TBPK tablet See admin instructions.     No current facility-administered medications for this visit.     Musculoskeletal: Strength & Muscle Tone: UTA Gait & Station: UTA Patient leans:  N/A  Psychiatric Specialty Exam: Review of Systems  Musculoskeletal:        Left hip pain  Psychiatric/Behavioral:  Positive for sleep disturbance. The patient is nervous/anxious.   All other systems reviewed and are negative.  There were no vitals taken for this visit.There is no height or weight on file to calculate BMI.  General Appearance: Casual  Eye Contact:  Good  Speech:  Clear and Coherent  Volume:  Normal  Mood:  Anxious  Affect:  Congruent  Thought Process:  Goal Directed and Descriptions of Associations: Intact  Orientation:  Full (Time, Place, and Person)  Thought Content: Logical   Suicidal Thoughts:  No  Homicidal Thoughts:  No  Memory:  Immediate;   Fair Recent;   Fair Remote;   Fair  Judgement:  Fair  Insight:  Fair  Psychomotor Activity:  Normal  Concentration:  Concentration: Fair and Attention Span: Fair  Recall:  [employer] of Knowledge: Good  Language: Fair  Akathisia:  No  Handed:  Right  AIMS (if indicated): not done  Assets:  Communication Skills Desire for [place] Talents/Skills Transportation  ADL's:  Intact  Cognition: WNL  Sleep:  Poor   Screenings: GAD-7    Flowsheet Row Video Visit from 05/03/2021 in  [place] Video Visit from 04/05/2021 in [place]  Total GAD-7 Score 3 9      PHQ2-9    Flowsheet Row Video Visit from 05/03/2021 in [place] from 05/02/2021 in [facility] Video Visit from 04/05/2021 in [facility] Video Visit from 01/31/2021 in [place] Video Visit from 01/04/2021 in [place]  PHQ-2 Total Score 0 0 _0 PHQ-9 Total Score -- -- 6 5 --      Flowsheet Row Video Visit from 01/31/2021 in [place] Video Visit from 01/04/2021 in [place] Admission (Discharged) from 11/09/2018 in [facility] ENDOSCOPY  C-SSRS RISK CATEGORY Low Risk No Risk No Risk        Assessment and Plan: Kathryn Friedman is a 65 year old Caucasian female who has a history of MDD, anxiety disorder, sleep problems, multiple medical problems was evaluated by telemedicine today.  Patient with psychosocial stressors of pandemic, recent surgery, pain, relationship struggles.  Patient continues to struggle with pain which does have an impact on her sleep as well as mood.  She will benefit from the following plan.  Plan MDD in remission Cymbalta 60 mg p.o. daily Lamictal 25 mg p.o. daily Continue CBT  Other specified anxiety disorder-improving Continue CBT Continue Cymbalta as prescribed Add gabapentin 300 mg p.o. nightly.  Insomnia-unstable She will benefit from sufficient pain management. Add gabapentin 300 mg p.o. nightly Hydroxyzine 25 mg p.o. nightly as needed Trazodone 100-200 mg p.o. nightly as needed Melatonin as needed Continue CPAP  Tobacco use disorder-unstable Provided counseling  Follow-up in clinic in 4 to 6 weeks or sooner if needed.   This note was generated in part or whole with voice recognition software. Voice recognition is usually quite accurate but there are transcription errors that can and very often do occur. I apologize for any typographical errors that were not detected and corrected.      Ursula Alert, MD 05/04/2021, 8:57 AM

## 2021-05-03 NOTE — Patient Instructions (Signed)

## 2021-05-04 DIAGNOSIS — L851 Acquired keratosis [keratoderma] palmaris et plantaris: Secondary | ICD-10-CM | POA: Diagnosis not present

## 2021-05-04 DIAGNOSIS — M21621 Bunionette of right foot: Secondary | ICD-10-CM | POA: Diagnosis not present

## 2021-05-04 DIAGNOSIS — G5781 Other specified mononeuropathies of right lower limb: Secondary | ICD-10-CM | POA: Diagnosis not present

## 2021-05-04 DIAGNOSIS — M79674 Pain in right toe(s): Secondary | ICD-10-CM | POA: Diagnosis not present

## 2021-05-04 DIAGNOSIS — M79675 Pain in left toe(s): Secondary | ICD-10-CM | POA: Diagnosis not present

## 2021-05-04 DIAGNOSIS — E1142 Type 2 diabetes mellitus with diabetic polyneuropathy: Secondary | ICD-10-CM | POA: Diagnosis not present

## 2021-05-04 DIAGNOSIS — M216X1 Other acquired deformities of right foot: Secondary | ICD-10-CM | POA: Diagnosis not present

## 2021-05-04 DIAGNOSIS — B351 Tinea unguium: Secondary | ICD-10-CM | POA: Diagnosis not present

## 2021-05-04 DIAGNOSIS — G5782 Other specified mononeuropathies of left lower limb: Secondary | ICD-10-CM | POA: Diagnosis not present

## 2021-05-04 DIAGNOSIS — M2011 Hallux valgus (acquired), right foot: Secondary | ICD-10-CM | POA: Diagnosis not present

## 2021-05-04 DIAGNOSIS — M2141 Flat foot [pes planus] (acquired), right foot: Secondary | ICD-10-CM | POA: Diagnosis not present

## 2021-05-04 DIAGNOSIS — M7741 Metatarsalgia, right foot: Secondary | ICD-10-CM | POA: Diagnosis not present

## 2021-05-08 DIAGNOSIS — R109 Unspecified abdominal pain: Secondary | ICD-10-CM | POA: Diagnosis not present

## 2021-05-08 DIAGNOSIS — R197 Diarrhea, unspecified: Secondary | ICD-10-CM | POA: Diagnosis not present

## 2021-05-08 DIAGNOSIS — M1612 Unilateral primary osteoarthritis, left hip: Secondary | ICD-10-CM | POA: Diagnosis not present

## 2021-05-08 DIAGNOSIS — K219 Gastro-esophageal reflux disease without esophagitis: Secondary | ICD-10-CM | POA: Diagnosis not present

## 2021-05-08 DIAGNOSIS — M48062 Spinal stenosis, lumbar region with neurogenic claudication: Secondary | ICD-10-CM | POA: Diagnosis not present

## 2021-05-08 DIAGNOSIS — M5136 Other intervertebral disc degeneration, lumbar region: Secondary | ICD-10-CM | POA: Diagnosis not present

## 2021-05-08 DIAGNOSIS — M5416 Radiculopathy, lumbar region: Secondary | ICD-10-CM | POA: Diagnosis not present

## 2021-05-09 ENCOUNTER — Telehealth: Payer: Medicare HMO

## 2021-05-11 ENCOUNTER — Ambulatory Visit (INDEPENDENT_AMBULATORY_CARE_PROVIDER_SITE_OTHER): Payer: Medicare HMO | Admitting: Pharmacist

## 2021-05-11 DIAGNOSIS — I10 Essential (primary) hypertension: Secondary | ICD-10-CM

## 2021-05-11 DIAGNOSIS — I700 Atherosclerosis of aorta: Secondary | ICD-10-CM

## 2021-05-11 DIAGNOSIS — F330 Major depressive disorder, recurrent, mild: Secondary | ICD-10-CM | POA: Diagnosis not present

## 2021-05-11 DIAGNOSIS — F17200 Nicotine dependence, unspecified, uncomplicated: Secondary | ICD-10-CM

## 2021-05-11 DIAGNOSIS — E7800 Pure hypercholesterolemia, unspecified: Secondary | ICD-10-CM

## 2021-05-11 DIAGNOSIS — J849 Interstitial pulmonary disease, unspecified: Secondary | ICD-10-CM

## 2021-05-11 DIAGNOSIS — I2510 Atherosclerotic heart disease of native coronary artery without angina pectoris: Secondary | ICD-10-CM | POA: Diagnosis not present

## 2021-05-11 DIAGNOSIS — E1159 Type 2 diabetes mellitus with other circulatory complications: Secondary | ICD-10-CM | POA: Diagnosis not present

## 2021-05-11 DIAGNOSIS — K219 Gastro-esophageal reflux disease without esophagitis: Secondary | ICD-10-CM

## 2021-05-11 DIAGNOSIS — R197 Diarrhea, unspecified: Secondary | ICD-10-CM

## 2021-05-11 DIAGNOSIS — J449 Chronic obstructive pulmonary disease, unspecified: Secondary | ICD-10-CM | POA: Diagnosis not present

## 2021-05-11 NOTE — Chronic Care Management (AMB) (Signed)
Chronic Care Management Pharmacy Note  05/11/2021 Name:  Kathryn Friedman MRN:  [id number] DOB:  04/14/1956   Subjective: Kathryn Friedman is an 65 y.o. year old female who is a primary patient of Einar Pheasant, MD.  The CCM team was consulted for assistance with disease management and care coordination needs.    Engaged with patient by telephone for follow up visit in response to provider referral for pharmacy case management and/or care coordination services.   Consent to Services:  The patient was given information about Chronic Care Management services, agreed to services, and gave verbal consent prior to initiation of services.  Please see initial visit note for detailed documentation.   Patient Care Team: Einar Pheasant, MD as PCP - General (Internal Medicine) De Hollingshead, RPH-CPP (Pharmacist)  Recent office visits: None since our last call  Recent consult visits: 4/7 - urology [facility] - trial Myrbetriq; ended up being $125 so changed to oxybutynin 4/8, 4/22- therapy 5/10 - PCP f/u - patient needs to schedule stress test 5/12 - [facility] - added hydroxyzine QPM 6/9 - [facility]  add gabapentin 300 mg QPM 6/10 - stress test - normal myocardial perfusion without evidence of myocardial ischemia 6/14 - Meeler, NP ortho - continue plan to meet w/ Poggi re hip replacement - continue gabapentin 300 mg QPM, acetaminophen BI, tramadol 50 mg BID PRN  6/14 Heide Spark, NP GI - continue pantoprazole 40 mg BID, start dicyclomine PRN occasional abdominal pain  Hospital visits: None in previous 6 months  Objective:  Lab Results  Component Value Date   CREATININE 0.66 03/30/2021   CREATININE 0.79 07/06/2020   CREATININE 0.71 03/17/2020    Lab Results  Component Value Date   HGBA1C 7.1 (H) 03/30/2021   Last diabetic Eye exam:  Lab Results  Component Value Date/Time   HMDIABEYEEXA No Retinopathy 11/06/2020 12:00 AM    Last diabetic Foot exam:   Lab Results  Component Value Date/Time   HMDIABFOOTEX by Dr Cleda Mccreedy - podiatry 12/10/2018 12:00 AM        Component Value Date/Time   CHOL 175 03/30/2021 0909   TRIG 298.0 (H) 03/30/2021 0909   HDL 41.70 03/30/2021 0909   CHOLHDL 4 03/30/2021 0909   VLDL 59.6 (H) 03/30/2021 0909   LDLCALC 53 07/06/2020 0824   LDLDIRECT 103.0 03/30/2021 0909    Hepatic Function Latest Ref Rng & Units 03/30/2021 07/06/2020 03/17/2020  Total Protein 6.0 - 8.3 g/dL 6.7 6.7 6.6  Albumin 3.5 - 5.2 g/dL 4.1 4.0 3.7  AST 0 - 37 U/L '27 14 16  ' ALT 0 - 35 U/L '26 16 15  ' Alk Phosphatase 39 - 117 U/L 70 64 70  Total Bilirubin 0.2 - 1.2 mg/dL 0.5 0.4 0.3  Bilirubin, Direct 0.0 - 0.3 mg/dL 0.1 0.1 0.1    Lab Results  Component Value Date/Time   TSH 1.56 03/17/2020 09:01 AM   TSH 1.42 12/02/2017 09:32 AM    CBC Latest Ref Rng & Units 03/30/2021 09/06/2019 04/28/2019  WBC 4.0 - 10.5 K/uL 10.6(H) 8.5 8.6  Hemoglobin 12.0 - 15.0 g/dL 15.8(H) 14.9 13.9  Hematocrit 36.0 - 46.0 % 46.6(H) 44.5 40.4  Platelets 150.0 - 400.0 K/uL 191.0 174.0 181.0    No results found for: VD25OH  Clinical ASCVD: Yes  The 10-year ASCVD risk score Mikey Bussing DC Jr., et al., 2013) is: 32.9%   Values used to calculate the score:     Age: 35 years  Sex: Female     Is Non-Hispanic African American: No     Diabetic: Yes     Tobacco smoker: Yes     Systolic Blood Pressure: 917 mmHg     Is BP treated: Yes     HDL Cholesterol: 41.7 mg/dL     Total Cholesterol: 175 mg/dL     Social History   Tobacco Use  Smoking Status Every Day   Packs/day: 1.50   Years: 45.00   Pack years: 67.50   Types: Cigarettes  Smokeless Tobacco Never   BP Readings from Last 3 Encounters:  04/03/21 114/66  03/01/21 (!) 153/72  12/19/20 133/84   Pulse Readings from Last 3 Encounters:  04/03/21 65  03/01/21 96  12/19/20 77   Wt Readings from Last 3 Encounters:  05/02/21 223 lb (101.2 kg)  04/03/21 223 lb (101.2 kg)  03/01/21 224 lb (101.6 kg)     Assessment: Review of patient past medical history, allergies, medications, health status, including review of consultants reports, laboratory and other test data, was performed as part of comprehensive evaluation and provision of chronic care management services.   SDOH:  (Social Determinants of Health) assessments and interventions performed:  SDOH Interventions    Flowsheet Row Most Recent Value  SDOH Interventions   SDOH Interventions for the Following Domains Financial Strain  Financial Strain Interventions Other (Comment)  [patient assistance]       CCM Care Plan  Allergies  Allergen Reactions   Varenicline Other (See Comments)    "I got really depressed" "I got really depressed" CHANTEX   Jardiance [Empagliflozin] Other (See Comments)    Yeast infection   Methylprednisolone Nausea Only and Nausea And Vomiting    Medications Reviewed Today     Reviewed by De Hollingshead, RPH-CPP (Pharmacist) on 05/11/21 at 1057  Med List Status: <None>   Medication Order Taking? Sig Documenting Provider Last Dose Status Informant  albuterol (VENTOLIN HFA) 108 (90 Base) MCG/ACT inhaler [id number] Yes INHALE 2 PUFFS FOUR TIMES A Lemont Fillers, MD Taking Active            Med Note Darnelle Maffucci, Arville Lime   Fri May 11, 2021 10:50 AM) 0-2 times daily  amLODipine (NORVASC) 5 MG tablet [id number] Yes Take 1 tablet (5 mg total) by mouth daily. Einar Pheasant, MD Taking Active   aspirin 81 MG tablet [id number] Yes Take 81 mg by mouth daily.  [provider] Taking Active Self  Blood Glucose Monitoring Suppl (TRUE METRIX METER) w/Device KIT [id number] Yes  [provider] Taking Active   Budeson-Glycopyrrol-Formoterol (BREZTRI AEROSPHERE) 160-9-4.8 MCG/ACT AERO [id number] Yes Inhale 2 puffs into the lungs in the morning and at bedtime. Einar Pheasant, MD Taking Active   CALCIUM PO [id number] Yes Take 600 mg by mouth daily.  [provider] Taking Active Self   clopidogrel (PLAVIX) 75 MG tablet [id number] Yes TAKE 1 TABLET EVERY DAY Einar Pheasant, MD Taking Active   cyclobenzaprine (FLEXERIL) 10 MG tablet [id number] Yes TAKE 1 TABLET THREE TIMES DAILY AS NEEDED FOR MUSCLE SPASM(S) Einar Pheasant, MD Taking Active            Med Note Darnelle Maffucci, Arville Lime   Fri May 11, 2021 10:52 AM) TID  diclofenac (VOLTAREN) 75 MG EC tablet [id number] Yes Take 75 mg by mouth 2 (two) times daily. [provider] Taking Active   dicyclomine (BENTYL) 20 MG tablet [id number] No Take 1 tablet by mouth every 6 (six) hours as needed.  Patient  not taking: Reported on 05/11/2021   [provider] Not Taking Active   DULoxetine (CYMBALTA) 60 MG capsule [id number] Yes Take 1 capsule (60 mg total) by mouth daily. Einar Pheasant, MD Taking Active   famotidine (PEPCID) 40 MG tablet [id number] Yes TAKE 1 TABLET (40 MG TOTAL) BY MOUTH DAILY. Einar Pheasant, MD Taking Active   gabapentin (NEURONTIN) 300 MG capsule [id number] Yes Take 1 capsule (300 mg total) by mouth at bedtime. Ursula Alert, MD Taking Active   glucose blood test strip [id number]  Use as instructed to check blood sugars twice daily. Dx E11.9. Einar Pheasant, MD  Active   hydrOXYzine (VISTARIL) 25 MG capsule [id number] Yes TAKE 1 CAPSULE (25 MG TOTAL) BY MOUTH AT BEDTIME AS NEEDED. FOR SLEEP Ursula Alert, MD Taking Active   isosorbide mononitrate (IMDUR) 30 MG 24 hr tablet [id number] Yes TAKE 1 TABLET EVERY DAY Einar Pheasant, MD Taking Active   L-FORMULA LYSINE HCL PO [id number] Yes Take by mouth. [provider] Taking Active   lamoTRIgine (LAMICTAL) 25 MG tablet [id number] Yes TAKE 1 TABLET EVERY DAY FOR MOOD Eappen, Saramma, MD Taking Active   metFORMIN (GLUCOPHAGE-XR) 500 MG 24 hr tablet [id number] Yes Take 2 tablets (1,000 mg total) by mouth in the morning and at bedtime. Einar Pheasant, MD Taking Active   Multiple Vitamin (MULTIVITAMIN PO) [id number] Yes Take 1 tablet by mouth daily.  [provider] Taking Active Self  mupirocin ointment (BACTROBAN) 2 % [id number]  Apply to affected area on abdomen bid Einar Pheasant, MD  Active   nystatin (MYCOSTATIN/NYSTOP) powder [id number]  APPLY TO THE AFFECTED AREA(S) TWICE DAILY Einar Pheasant, MD  Active   oxybutynin (DITROPAN-XL) 10 MG 24 hr tablet [id number] Yes Take 1 tablet (10 mg total) by mouth daily. Einar Pheasant, MD Taking Active   pantoprazole (PROTONIX) 40 MG tablet [id number] Yes TAKE 1 TABLET TWICE DAILY BEFORE MEALS Einar Pheasant, MD Taking Active   PFIZER-BIONT COVID-19 VAC-TRIS SUSP injection [id number]   [provider]  Active   Potassium 99 MG TABS [id number] Yes Take by mouth. [provider] Taking Active   propranolol (INDERAL) 40 MG tablet [id number] Yes 1 tab daily PO Einar Pheasant, MD Taking Active   rosuvastatin (CRESTOR) 20 MG tablet [id number] Yes Take 1 tablet (20 mg total) by mouth daily. Einar Pheasant, MD Taking Active   telmisartan (MICARDIS) 80 MG tablet [id number] Yes Take 1 tablet (80 mg total) by mouth daily. Einar Pheasant, MD Taking Active   traMADol Veatrice Bourbon) 50 MG tablet [id number] Yes Take 50 mg by mouth as needed. [provider]  Active            Med Note Darnelle Maffucci, Arville Lime   Fri May 11, 2021 10:49 AM) 1-2 times daily PRN   traZODone (DESYREL) 100 MG tablet [id number] Yes Take 2 tablets (200 mg total) by mouth at bedtime. Einar Pheasant, MD Taking Active             Patient Active Problem List   Diagnosis Date Noted   Moderate episode of recurrent major depressive disorder ([place]) 04/05/2021   Aortic atherosclerosis ([place]) 04/03/2021   SOBOE (shortness of breath on exertion) 01/16/2021   Thrush 12/27/2020   Suspected COVID-19 virus infection 12/20/2020   MDD (major depressive disorder), recurrent episode, mild ([place]) 11/16/2020   Abdominal wall abscess 07/30/2020   Skin lesion 07/23/2020   Boil 07/20/2020   MDD (major depressive disorder),  recurrent, in full remission ([place]) 06/26/2020   Insomnia due to  medical condition 06/26/2020   Anxiety disorder 06/26/2020   Cough 04/03/2020   Urge incontinence 03/02/2020   Urinary frequency 02/06/2020   Bilateral carotid artery stenosis 10/15/2019   PSVT (paroxysmal supraventricular tachycardia) ([place]) 08/09/2019   History of migraine headaches 05/10/2019   Lower abdominal pain 03/21/2019   Dysphagia 09/15/2018   ILD (interstitial lung disease) ([place]) 08/17/2017   Tobacco use disorder 08/11/2016   Venous insufficiency of both lower extremities 07/10/2016   Healthcare maintenance 06/18/2016   Lump in the abdomen 08/06/2015   Dizziness 08/06/2015   Fatty infiltration of liver 08/11/2014   Blood in the stool 08/11/2014   Acute diarrhea 08/11/2014   Hemorrhage of gastrointestinal tract 08/07/2014   GERD (gastroesophageal reflux disease) 10/20/2013   Iron deficiency anemia 10/20/2013   Nausea with vomiting 10/19/2013   LUQ pain 10/19/2013   Diarrhea 09/26/2013   Anemia 04/12/2013   CAD (coronary artery disease) 02/18/2013   COPD (chronic obstructive pulmonary disease) ([place]) 02/18/2013   Obstructive sleep apnea 02/18/2013   Mild depression ([place]) 02/18/2013   Diverticulosis 02/18/2013   Diabetes ([place]) 02/17/2013   Essential hypertension, benign 02/17/2013   Hypercholesterolemia 02/17/2013    Immunization History  Administered Date(s) Administered   Influenza,inj,Quad PF,6+ Mos 09/23/2013, 09/09/2014, 08/01/2015, 08/06/2016, 08/14/2017, 08/14/2018, 09/16/2019, 07/13/2020   Influenza-Unspecified 10/24/2011   PFIZER(Purple Top)SARS-COV-2 Vaccination 02/23/2020, 03/15/2020, 02/09/2021   Pneumococcal Polysaccharide-23 09/23/2013   Td 04/21/2018    Conditions to be addressed/monitored: CAD, HTN, HLD, COPD, and DMII  Care Plan : General Pharmacy (Adult)  Updates made by De Hollingshead, RPH-CPP since 05/11/2021 12:00 AM     Problem: COPD, CAD, HLD, HTN      Long-Range  Goal: Disease Progression Prevention   This Visit's Progress: On track  Recent Progress: On track  Priority: High  Note:   Current Barriers:  Unable to independently afford treatment regimen Complex patient with multiple comorbidities including diabetes, coronary artery disease, COPD, depression/anxiety  Pharmacist Clinical Goal(s):  Over the next 90 days, patient will verbalize ability to afford treatment regimen. Over the next 90 days, patient will adhere to prescribed medication regimen  Interventions: 1:1 collaboration with Einar Pheasant, MD regarding development and update of comprehensive plan of care as evidenced by provider attestation and co-signature Inter-disciplinary care team collaboration (see longitudinal plan of care) Comprehensive medication review performed; medication list updated in electronic medical record  Diabetes: Controlled; current treatment: metformin XR 1000 mg BID Previously recommended to continue current regimen at this time.   Hypertension, Coronary Artery Disease Controlled per last clinic readings; current treatment: amlodipine 5 mg daily, isosorbide mononitrate 30 mg QAM, propranolol 40 mg daily, telmisartan 80 mg daily; follows w/ Dr. Nehemiah Massed. Stress test 6/7. Reported as normal by Dr. Nehemiah Massed Recommended to continue current treatment regimen along with collaboration with cardiology   Hyperlipidemia, ASCVD Risk Reduction Controlled per last lipid panel; current treatment: atorvastatin 20 mg daily Antiplatelet therapy: clopidogrel 75 mg daily, aspirin 81 mg daily  Previously recommended to continue current treatment regimen  Chronic Obstructive Pulmonary Disease: Controlled; current treatment: Breztri 160/9/4.8 mcg 2 puffs BID; albuterol HFA PRN - 1-2 times daily due to wheezing and SOB; follows w/ Dr. Raul Del Denies concerns with worsened mouth sores that she had discussed with me previously.  Receiving Breztri through [facility] patient  assistance Discussed albuterol HFA cost. Encouraged to compare costs for GoodRx (w/o insurance) vs insurance. [facility]/ [facility] is ~ $25. Encouraged to communicate w/ Dr. Raul Del if she would like an albuterol  HFA script sent to either of those pharmacies.  Continue current regimen at this time along with pulmonology collaboration  Depression/Anxiety/Insomnia/Pain: Exacerbated by pain; follows w/ Dr. Shea Evans. Current regimen: lamotrigine 25 mg daily; duloxetine 60 mg daily, trazodone 200 mg QPM; started on hydroxyzine 25 mg QPM in May with no benefit, started on gabapentin 300 mg QPM earlier this month.  Does note some benefit w/ gabapentin. Discussed concern with multiple sedating medications. Consider trial of gabapentin w/o use of hydroxyzine to see if still able to sleep. Patient verbalized understanding.  Recommended to continue current regimen along with collaboration w/ psychiatry and LCSW.   Pain, reports need for hip replacement: Uncontrolled; current treatment: diclofenac 75 mg BID, cyclobenzaprine 5 mg up to TID for pain/muscle spasms, acetaminophen 500 mg BID PRN, tramadol 50 mg BID PRN, gabapentin as above. Follows w/ Tamala Julian, orthopedist Appointment next week w/ Poggi to determine plan for hip replacement.  Recommend to continue current regimen at this time along with orthopedic collaboration  Diarrhea, chronic with GERD: Moderately well controlled per patient report; Current regimen: taking imodium daily from OTC insurance benefits; pantoprazole 40 mg BID with famotidine 40 mg daily, prescribed dicyclomine 10 mg PRN but has not started this yet Reviewed that dicyclomine was covered previously, but it was a stocking issue that cause the transition to cholestyramine that was not covered. Encouraged to call the pharmacy to see what the cost of colestipol would be, given it appears her diarrhea was better controlled at that time. Patient notes that she prefers to continue imodium  because she can get it for free from OTC benefits No patient assistance options for bile acid sequestrants . Encouraged to continue to collaborate with GI to determine the best long term plan for management of chronic diarrhea.   Overactive Bladder: Managed per patient report; current regimen: oxybutynin XL 10 mg daily, follows w/ Dr. Bernardo Heater Previously on tolterodine, beneficial at first but then waned over time Myrbetriq too expensive on her insurance Recommended to continue current regimen and collaboration w/ urology Continue to monitor for need to avoid additional anticholinergic medications  Tobacco Abuse: 1-1.5 packs per day; 50 years of use Previous quit attempts: unsuccessful using varenicline (nightmares) Triggers to smoke: stress, familial stress lately  Previously encouraged to start nicotine patches + gum when ready to quit. Will revisit discussion after pending surgical plan  Patient Goals/Self-Care Activities Over the next 90 days, patient will:  - take medications as prescribed collaborate with provider on medication access solutions  Follow Up Plan: Telephone follow up appointment with care management team member scheduled for:~ 12 weeks      Medication Assistance:  Breztri obtained through [employer] medication assistance program.  Enrollment ends 11/24/21  Patient's preferred pharmacy is:  [facility] - [place], [address] [place] [address] Phone: [phone] Fax: [phone]  [id number] [address]Phone: [phone]: [phone]  Follow Up:  Patient agrees to Care Plan and Follow-up.   Plan: Telephone follow up appointment with care management team member scheduled for:  12 weeks  Catie TDarnelle Maffucci PharmD, [place] CCitrus HeightsClinical Pharmacist LOccidental Petroleumat [phone]

## 2021-05-11 NOTE — Patient Instructions (Signed)
Visit Information  PATIENT GOALS:  Goals Addressed               This Visit's Progress     Patient Stated     Medication Monitoring (pt-stated)        Patient Goals/Self-Care Activities Over the next 90 days, patient will:  - take medications as prescribed - collaborate with provider on medication access solutions.          Patient verbalizes understanding of instructions provided today and agrees to view in MyChart.   Plan: Telephone follow up appointment with care management team member scheduled for:  ~ 12 weeks  Catie Wm Sahagun, PharmD, BCACP, CPP Clinical Pharmacist [facility] [place] [phone] 

## 2021-05-16 ENCOUNTER — Other Ambulatory Visit: Payer: Self-pay | Admitting: *Deleted

## 2021-05-16 MED ORDER — OXYBUTYNIN CHLORIDE ER 10 MG PO TB24: 10.0000 mg | ORAL_TABLET | Freq: Every day | ORAL | 0 refills | Status: DC

## 2021-05-18 ENCOUNTER — Telehealth: Payer: Self-pay

## 2021-05-18 DIAGNOSIS — M1612 Unilateral primary osteoarthritis, left hip: Secondary | ICD-10-CM | POA: Insufficient documentation

## 2021-05-18 NOTE — Telephone Encounter (Signed)
received a fax requesting a refill for the gabapentin 300mg 

## 2021-05-18 NOTE — Telephone Encounter (Signed)
Attempted to contact patient to verify-no response.  Patient's  [facility] does [facility]ill on [facility].  She is not due yet.

## 2021-05-21 ENCOUNTER — Telehealth: Payer: Self-pay | Admitting: *Deleted

## 2021-05-21 ENCOUNTER — Telehealth: Payer: Self-pay

## 2021-05-21 NOTE — Telephone Encounter (Signed)
received fax requesting a 90 day supply of the gabapentin

## 2021-05-21 NOTE — Telephone Encounter (Signed)
Please place future orders for lab appt.    Only order found was for a Hep C ordered by Dr. Derrel Nip

## 2021-05-21 NOTE — Telephone Encounter (Signed)
Decline- only do monthly refill at this time (ordered by Dr. Shea Evans).

## 2021-05-22 ENCOUNTER — Other Ambulatory Visit: Payer: Self-pay | Admitting: Internal Medicine

## 2021-05-22 ENCOUNTER — Telehealth: Payer: Self-pay

## 2021-05-22 DIAGNOSIS — D582 Other hemoglobinopathies: Secondary | ICD-10-CM

## 2021-05-22 DIAGNOSIS — E7800 Pure hypercholesterolemia, unspecified: Secondary | ICD-10-CM

## 2021-05-22 NOTE — Telephone Encounter (Signed)
I have placed order for cbc, liver panel and also would like hep C antibody drawn.   Thanks.

## 2021-05-22 NOTE — Telephone Encounter (Signed)
Incoming call on triage line from patient in regards to her oxybutynin Rx. Patient is stating that Kathryn Friedman is telling her they did not receive Rx. Patient would like for someone to call [facility] and seek clarification.   After calling and speaking with pharmacy tech, Rx is awaiting a clarification about a drug interaction between oxybutynin and hydroxyzine. Once this form is filled out, Rx can be filled and sent to patient. [employer] is resending form, fax number given. Patient notified.

## 2021-05-22 NOTE — Progress Notes (Signed)
Order placed for f/u labs.  

## 2021-05-23 ENCOUNTER — Other Ambulatory Visit (INDEPENDENT_AMBULATORY_CARE_PROVIDER_SITE_OTHER): Payer: Medicare HMO

## 2021-05-23 ENCOUNTER — Other Ambulatory Visit: Payer: Self-pay

## 2021-05-23 DIAGNOSIS — E7800 Pure hypercholesterolemia, unspecified: Secondary | ICD-10-CM | POA: Diagnosis not present

## 2021-05-23 DIAGNOSIS — D582 Other hemoglobinopathies: Secondary | ICD-10-CM | POA: Diagnosis not present

## 2021-05-23 DIAGNOSIS — Z1159 Encounter for screening for other viral diseases: Secondary | ICD-10-CM

## 2021-05-23 LAB — HEPATIC FUNCTION PANEL
ALT: 14 U/L (ref 0–35)
AST: 20 U/L (ref 0–37)
Albumin: 3.9 g/dL (ref 3.5–5.2)
Alkaline Phosphatase: 69 U/L (ref 39–117)
Bilirubin, Direct: 0.2 mg/dL (ref 0.0–0.3)
Total Bilirubin: 0.6 mg/dL (ref 0.2–1.2)
Total Protein: 6.2 g/dL (ref 6.0–8.3)

## 2021-05-23 LAB — CBC WITH DIFFERENTIAL/PLATELET
Basophils Absolute: 0 10*3/uL (ref 0.0–0.1)
Basophils Relative: 0.3 % (ref 0.0–3.0)
Eosinophils Absolute: 0.2 10*3/uL (ref 0.0–0.7)
Eosinophils Relative: 1.8 % (ref 0.0–5.0)
HCT: 46.1 % — ABNORMAL HIGH (ref 36.0–46.0)
Hemoglobin: 15.7 g/dL — ABNORMAL HIGH (ref 12.0–15.0)
Lymphocytes Relative: 22.8 % (ref 12.0–46.0)
Lymphs Abs: 2.4 10*3/uL (ref 0.7–4.0)
MCHC: 34.1 g/dL (ref 30.0–36.0)
MCV: 94.2 fl (ref 78.0–100.0)
Monocytes Absolute: 0.5 10*3/uL (ref 0.1–1.0)
Monocytes Relative: 4.6 % (ref 3.0–12.0)
Neutro Abs: 7.5 10*3/uL (ref 1.4–7.7)
Neutrophils Relative %: 70.5 % (ref 43.0–77.0)
Platelets: 175 10*3/uL (ref 150.0–400.0)
RBC: 4.89 Mil/uL (ref 3.87–5.11)
RDW: 14.7 % (ref 11.5–15.5)
WBC: 10.7 10*3/uL — ABNORMAL HIGH (ref 4.0–10.5)

## 2021-05-24 LAB — HEPATITIS C ANTIBODY
Hepatitis C Ab: NONREACTIVE
SIGNAL TO CUT-OFF: 0.01 (ref <1.00)

## 2021-05-25 NOTE — Telephone Encounter (Signed)
Pa pending via cover my meds. Key [id number])

## 2021-05-29 ENCOUNTER — Telehealth: Payer: Self-pay | Admitting: *Deleted

## 2021-05-29 ENCOUNTER — Other Ambulatory Visit: Payer: Self-pay | Admitting: Surgery

## 2021-05-29 NOTE — Telephone Encounter (Signed)
Oxbutynin is ggod until 11/24/2021 per fax from [facility]

## 2021-05-29 NOTE — Telephone Encounter (Signed)
Patient called to f/u on her Gabapentin. [facility] informed her that per Dr. Shea Evans her Gabapentin mg was going to be changed and they are waiting for new script from provider's office with New mg script. Per pt pharmacy have not filled her medication due to that and need refills for this medication. Informed patient with last message on file and informed her that staff does not currently see anything about a change of dosage in her chart. [facility]. Patient verbalized understanding but was a bit irritated due to not knowing med was there. Staff apologized and patient agreed to go pick med up from pharmacy.

## 2021-06-06 ENCOUNTER — Encounter
Admission: RE | Admit: 2021-06-06 | Discharge: 2021-06-06 | Disposition: A | Discharge status: Home / Self Care | Payer: Medicare HMO | Source: Ambulatory Visit | Attending: Surgery | Admitting: Surgery

## 2021-06-06 ENCOUNTER — Other Ambulatory Visit: Payer: Self-pay

## 2021-06-06 DIAGNOSIS — Z01810 Encounter for preprocedural cardiovascular examination: Secondary | ICD-10-CM | POA: Diagnosis not present

## 2021-06-06 DIAGNOSIS — Z01818 Encounter for other preprocedural examination: Secondary | ICD-10-CM | POA: Insufficient documentation

## 2021-06-06 HISTORY — DX: Angina pectoris, unspecified: I20.9

## 2021-06-06 HISTORY — DX: Dyspnea, unspecified: R06.00

## 2021-06-06 HISTORY — DX: Cardiac arrhythmia, unspecified: I49.9

## 2021-06-06 HISTORY — DX: COVID-19: U07.1

## 2021-06-06 LAB — TYPE AND SCREEN
ABO/RH(D): O NEG
Antibody Screen: NEGATIVE

## 2021-06-06 LAB — CBC WITH DIFFERENTIAL/PLATELET
Abs Immature Granulocytes: 0.04 10*3/uL (ref 0.00–0.07)
Basophils Absolute: 0.1 10*3/uL (ref 0.0–0.1)
Basophils Relative: 1 %
Eosinophils Absolute: 0.2 10*3/uL (ref 0.0–0.5)
Eosinophils Relative: 2 %
HCT: 43 % (ref 36.0–46.0)
Hemoglobin: 14.6 g/dL (ref 12.0–15.0)
Immature Granulocytes: 0 %
Lymphocytes Relative: 26 %
Lymphs Abs: 2.5 10*3/uL (ref 0.7–4.0)
MCH: 32.4 pg (ref 26.0–34.0)
MCHC: 34 g/dL (ref 30.0–36.0)
MCV: 95.6 fL (ref 80.0–100.0)
Monocytes Absolute: 0.4 10*3/uL (ref 0.1–1.0)
Monocytes Relative: 5 %
Neutro Abs: 6.4 10*3/uL (ref 1.7–7.7)
Neutrophils Relative %: 66 %
Platelets: 167 10*3/uL (ref 150–400)
RBC: 4.5 MIL/uL (ref 3.87–5.11)
RDW: 14.1 % (ref 11.5–15.5)
WBC: 9.6 10*3/uL (ref 4.0–10.5)
nRBC: 0 % (ref 0.0–0.2)

## 2021-06-06 LAB — COMPREHENSIVE METABOLIC PANEL
ALT: 13 U/L (ref 0–44)
AST: 22 U/L (ref 15–41)
Albumin: 3.4 g/dL — ABNORMAL LOW (ref 3.5–5.0)
Alkaline Phosphatase: 54 U/L (ref 38–126)
Anion gap: 8 (ref 5–15)
BUN: 12 mg/dL (ref 8–23)
CO2: 27 mmol/L (ref 22–32)
Calcium: 8.5 mg/dL — ABNORMAL LOW (ref 8.9–10.3)
Chloride: 101 mmol/L (ref 98–111)
Creatinine, Ser: 0.81 mg/dL (ref 0.44–1.00)
GFR, Estimated: >60 mL/min (ref >60)
Glucose, Bld: 105 mg/dL — ABNORMAL HIGH (ref 70–99)
Potassium: 4.4 mmol/L (ref 3.5–5.1)
Sodium: 136 mmol/L (ref 135–145)
Total Bilirubin: 0.7 mg/dL (ref 0.3–1.2)
Total Protein: 6.2 g/dL — ABNORMAL LOW (ref 6.5–8.1)

## 2021-06-06 LAB — SURGICAL PCR SCREEN
MRSA, PCR: NEGATIVE
Staphylococcus aureus: NEGATIVE

## 2021-06-06 LAB — URINALYSIS, ROUTINE W REFLEX MICROSCOPIC
Glucose, UA: NEGATIVE mg/dL
Hgb urine dipstick: NEGATIVE
Ketones, ur: 5 mg/dL — AB
Nitrite: NEGATIVE
Protein, ur: 30 mg/dL — AB
Specific Gravity, Urine: 1.036 — ABNORMAL HIGH (ref 1.005–1.030)
pH: 5 (ref 5.0–8.0)

## 2021-06-06 NOTE — Patient Instructions (Addendum)
Your procedure is scheduled on: 06/12/21 - Tuesday Report to the Registration Desk on the 1st floor of the [facility]. To find out your arrival time, please call [phone] between 1PM - 3PM on: 06/11/21  Report to [facility] 06/11/21 at 8:15 am for Covid Test  REMEMBER: Instructions that are not followed completely may result in serious medical risk, up to and including death; or upon the discretion of your surgeon and anesthesiologist your surgery may need to be rescheduled.  Do not eat food after midnight the night before surgery.  No gum chewing, lozengers or hard candies.  You may however, drink CLEAR liquids up to 2 hours before you are scheduled to arrive for your surgery. Do not drink anything within 2 hours of your scheduled arrival time. Type 1 and Type 2 diabetics should only drink water.  In addition, your doctor has ordered for you to drink the provided  Gatorade G2 Drinking this carbohydrate drink up to two hours before surgery helps to reduce insulin resistance and improve patient outcomes. Please complete drinking 2 hours prior to scheduled arrival time.  TAKE THESE MEDICATIONS THE MORNING OF SURGERY WITH A SIP OF WATER:  - amLODipine (NORVASC) 5 MG tablet - Budeson-Glycopyrrol-Formoterol (BREZTRI AEROSPHERE) 160-9-4.8 MCG/ACT AERO - DULoxetine (CYMBALTA) 60 MG capsule - pantoprazole (PROTONIX) 40 MG tablet - isosorbide mononitrate (IMDUR) 30 MG 24 hr tablet - lamoTRIgine (LAMICTAL) 25 MG tablet - oxybutynin (DITROPAN-XL) 10 MG 24 hr tablet - propranolol (INDERAL) 40 MG tablet - rosuvastatin (CRESTOR) 20 MG tablet  Use inhaler albuterol (VENTOLIN HFA) 108 (90 Base) MCG/ACT on the day of surgery and bring to the hospital.  Stop Metformin 2 days prior to surgery. Do not take 07/17, 07/18 and do not take the day of surgery.  Follow recommendations from Cardiologist, Pulmonologist or PCP regarding stopping Aspirin, Coumadin, Plavix, Eliquis, Pradaxa, or Pletal.  Stop Plavix begging 06/07/21, continue taking aspirin but do not take the morning of surgery.  - diclofenac (VOLTAREN) 75 MG EC tablet stop taking 1 week prior to surgery. Stop taking beginning today.  One week prior to surgery: Stop Anti-inflammatories (NSAIDS) such as Advil, Aleve, Ibuprofen, Motrin, Naproxen, Naprosyn and Aspirin based products such as Excedrin, Goodys Powder, BC Powder.  Stop ANY OVER THE COUNTER supplements until after surgery beginning 06/06/21  You may however, continue to take Tylenol if needed for pain up until the day of surgery.  No Alcohol for 24 hours before or after surgery.  No Smoking including e-cigarettes for 24 hours prior to surgery.  No chewable tobacco products for at least 6 hours prior to surgery.  No nicotine patches on the day of surgery.  Do not use any "recreational" drugs for at least a week prior to your surgery.  Please be advised that the combination of cocaine and anesthesia may have negative outcomes, up to and including death. If you test positive for cocaine, your surgery will be cancelled.  On the morning of surgery brush your teeth with toothpaste and water, you may rinse your mouth with mouthwash if you wish. Do not swallow any toothpaste or mouthwash.  Do not wear jewelry, make-up, hairpins, clips or nail polish.  Do not wear lotions, powders, or perfumes.   Do not shave body from the neck down 48 hours prior to surgery just in case you cut yourself which could leave a site for infection.  Also, freshly shaved skin may become irritated if using the CHG soap.  Contact lenses, hearing aids  and dentures may not be worn into surgery.  Do not bring valuables to the hospital. [facility] is not responsible for any missing/lost belongings or valuables.   Use CHG Soap or wipes as directed on instruction sheet.  Bring your C-PAP to the hospital with you in case you may have to spend the night.   Notify your doctor if there is any  change in your medical condition (cold, fever, infection).  Wear comfortable clothing (specific to your surgery type) to the hospital.  After surgery, you can help prevent lung complications by doing breathing exercises.  Take deep breaths and cough every 1-2 hours. Your doctor may order a device called an Incentive Spirometer to help you take deep breaths. When coughing or sneezing, hold a pillow firmly against your incision with both hands. This is called "splinting." Doing this helps protect your incision. It also decreases belly discomfort.  If you are being admitted to the hospital overnight, leave your suitcase in the car. After surgery it may be brought to your room.  If you are being discharged the day of surgery, you will not be allowed to drive home. You will need a responsible adult (18 years or older) to drive you home and stay with you that night.   If you are taking public transportation, you will need to have a responsible adult (18 years or older) with you. Please confirm with your physician that it is acceptable to use public transportation.   Please call the [facility] at [phone] if you have any questions about these instructions.  Surgery Visitation Policy:  Patients undergoing a surgery or procedure may have one family member or support person with them as long as that person is not COVID-19 positive or experiencing its symptoms.  That person may remain in the waiting area during the procedure.  Inpatient Visitation:    Visiting hours are 7 a.m. to 8 p.m. Inpatients will be allowed two visitors daily. The visitors may change each day during the patient's stay. No visitors under the age of 25. Any visitor under the age of 90 must be accompanied by an adult. The visitor must pass COVID-19 screenings, use hand sanitizer when entering and exiting the patient's room and wear a mask at all times, including in the patient's room. Patients must also  wear a mask when staff or their visitor are in the room. Masking is required regardless of vaccination status.

## 2021-06-07 ENCOUNTER — Encounter: Payer: Self-pay | Admitting: Surgery

## 2021-06-07 ENCOUNTER — Telehealth: Payer: Medicare HMO | Admitting: Psychiatry

## 2021-06-07 DIAGNOSIS — I25119 Atherosclerotic heart disease of native coronary artery with unspecified angina pectoris: Secondary | ICD-10-CM | POA: Diagnosis not present

## 2021-06-07 DIAGNOSIS — I480 Paroxysmal atrial fibrillation: Secondary | ICD-10-CM | POA: Insufficient documentation

## 2021-06-07 DIAGNOSIS — I6523 Occlusion and stenosis of bilateral carotid arteries: Secondary | ICD-10-CM | POA: Diagnosis not present

## 2021-06-07 DIAGNOSIS — I10 Essential (primary) hypertension: Secondary | ICD-10-CM | POA: Diagnosis not present

## 2021-06-07 DIAGNOSIS — E782 Mixed hyperlipidemia: Secondary | ICD-10-CM | POA: Diagnosis not present
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
## 2021-06-08 ENCOUNTER — Encounter: Payer: Self-pay | Admitting: Surgery

## 2021-06-08 ENCOUNTER — Telehealth: Payer: Self-pay | Admitting: Urgent Care

## 2021-06-08 DIAGNOSIS — N390 Urinary tract infection, site not specified: Secondary | ICD-10-CM

## 2021-06-08 DIAGNOSIS — Z01818 Encounter for other preprocedural examination: Secondary | ICD-10-CM

## 2021-06-08 MED ORDER — SULFAMETHOXAZOLE-TRIMETHOPRIM 800-160 MG PO TABS: 1.0000 | ORAL_TABLET | Freq: Two times a day (BID) | ORAL | 0 refills | Status: AC

## 2021-06-08 NOTE — Progress Notes (Signed)
  [facility] Perioperative Services: Pre-Admission/Anesthesia Testing  Abnormal Lab Notification and Treatment Plan of Care   Date: 06/08/21  Name: Kathryn Friedman MRN:   [id number]  Re: Abnormal labs noted during PAT appointment   Provider(s) Notified: Milagros Evener, [facility] Notification mode: Routed and/or faxed via [place] LAB VALUE(S): Lab Results  Component Value Date   COLORURINE AMBER (A) 06/06/2021   APPEARANCEUR HAZY (A) 06/06/2021   LABSPEC 1.036 (H) 06/06/2021   PHURINE 5.0 06/06/2021   GLUCOSEU NEGATIVE 06/06/2021   HGBUR NEGATIVE 06/06/2021   BILIRUBINUR SMALL (A) 06/06/2021   KETONESUR 5 (A) 06/06/2021   PROTEINUR 30 (A) 06/06/2021   UROBILINOGEN 0.2 03/18/2017   NITRITE NEGATIVE 06/06/2021   LEUKOCYTESUR TRACE (A) 06/06/2021   EPIU 0-5 06/06/2021   WBCU 6-10 06/06/2021   RBCU 0-5 06/06/2021   BACTERIA FEW (A) 06/06/2021   Lab Results  Component Value Date   CULT >=100,000 COLONIES/mL KLEBSIELLA PNEUMONIAE (A) 06/06/2021    Notes:   Patient scheduled for a LEFT total hip arthroplasty on 06/12/2021 with Dr. Milagros Evener, MD.  UA performed in PAT consistent with/concerning for infection.  No leukocytosis noted on CBC; WBC 9600 Renal function: Estimated Creatinine Clearance: 79 mL/min (by C-G formula based on SCr of 0.81 mg/dL). Urine C&S added to assess for pathogenically significant growth. (+) for 100,000 CFU/mL Klebsiella pneumoniae  Impression and Plan:  UA was (+) for infection; reflex culture sent. Contacted patient to discuss. Patient reporting that she is experiencing LUTS; (+) frequency, urgency, and dysuria.  She notes that her urine has been really dark in color and has a slight odor.  Patient denied any back pain or suprapubic pain.  No nausea, vomiting, or fevers.Marland Kitchen PMH significant for recurrent UTIs. Patient with surgery scheduled soon. In efforts to avoid delaying patient's procedure, or having her experienced any  perioperative complications, I would like to proceed with empiric treatment for urinary tract infection.  Allergies reviewed. Will treat with a 3 day course of SMZ-TMP DS; Rx sent to [facility] on [address] in [place], [place]. Patient encouraged to complete the entire course of antibiotics even if she begins to feel better. She was advised that if culture demonstrates resistance to the prescribed antibiotic, she will be contacted and advised of the need to change the antibiotic being used to treat her infection.  Additionally, patient is scheduled to receive a single preoperative dose of cefazolin on the day of her procedure.  Meds ordered this encounter  Medications   sulfamethoxazole-trimethoprim (BACTRIM DS) 800-160 MG tablet    Sig: Take 1 tablet by mouth 2 (two) times daily for 3 days.    Dispense:  6 tablet    Refill:  0   Patient encouraged to increase her fluid intake as much as possible. Discussed that water is always best to flush the urinary tract. She was advised to avoid caffeine containing fluids until her infections clears, as caffeine can cause her to experience painful bladder spasms.   May use Tylenol as needed for pain/fever.  Results and treatment plan of care forwarded to primary attending surgeon to make them aware.   This is a Community education officer; no formal response is required.  Honor Loh, MSN, APRN, FNP-C, CEN [facility]  Peri-operative Services Nurse Practitioner Phone: [phone] Fax: [phone] 06/08/21 2:56 PM

## 2021-06-09 LAB — URINE CULTURE: Culture: >=100000 — AB

## 2021-06-11 ENCOUNTER — Other Ambulatory Visit: Payer: Self-pay

## 2021-06-11 ENCOUNTER — Other Ambulatory Visit
Admission: RE | Admit: 2021-06-11 | Discharge: 2021-06-11 | Disposition: A | Discharge status: Home / Self Care | Payer: Medicare HMO | Source: Ambulatory Visit | Attending: Surgery | Admitting: Surgery

## 2021-06-11 DIAGNOSIS — I2510 Atherosclerotic heart disease of native coronary artery without angina pectoris: Secondary | ICD-10-CM | POA: Diagnosis present

## 2021-06-11 DIAGNOSIS — J849 Interstitial pulmonary disease, unspecified: Secondary | ICD-10-CM | POA: Diagnosis present

## 2021-06-11 DIAGNOSIS — I10 Essential (primary) hypertension: Secondary | ICD-10-CM | POA: Diagnosis present

## 2021-06-11 DIAGNOSIS — Z96642 Presence of left artificial hip joint: Secondary | ICD-10-CM | POA: Diagnosis not present

## 2021-06-11 DIAGNOSIS — I700 Atherosclerosis of aorta: Secondary | ICD-10-CM | POA: Diagnosis present

## 2021-06-11 DIAGNOSIS — E785 Hyperlipidemia, unspecified: Secondary | ICD-10-CM | POA: Diagnosis present

## 2021-06-11 DIAGNOSIS — Z01812 Encounter for preprocedural laboratory examination: Secondary | ICD-10-CM | POA: Insufficient documentation

## 2021-06-11 DIAGNOSIS — Z7984 Long term (current) use of oral hypoglycemic drugs: Secondary | ICD-10-CM | POA: Diagnosis not present

## 2021-06-11 DIAGNOSIS — R600 Localized edema: Secondary | ICD-10-CM | POA: Diagnosis not present

## 2021-06-11 DIAGNOSIS — G4733 Obstructive sleep apnea (adult) (pediatric): Secondary | ICD-10-CM | POA: Diagnosis present

## 2021-06-11 DIAGNOSIS — Z9889 Other specified postprocedural states: Secondary | ICD-10-CM | POA: Diagnosis not present

## 2021-06-11 DIAGNOSIS — Z20822 Contact with and (suspected) exposure to covid-19: Secondary | ICD-10-CM | POA: Insufficient documentation

## 2021-06-11 DIAGNOSIS — Z471 Aftercare following joint replacement surgery: Secondary | ICD-10-CM | POA: Diagnosis not present

## 2021-06-11 DIAGNOSIS — R531 Weakness: Secondary | ICD-10-CM | POA: Diagnosis not present

## 2021-06-11 DIAGNOSIS — Z801 Family history of malignant neoplasm of trachea, bronchus and lung: Secondary | ICD-10-CM | POA: Diagnosis not present

## 2021-06-11 DIAGNOSIS — Z8616 Personal history of COVID-19: Secondary | ICD-10-CM | POA: Diagnosis not present

## 2021-06-11 DIAGNOSIS — K219 Gastro-esophageal reflux disease without esophagitis: Secondary | ICD-10-CM | POA: Diagnosis present

## 2021-06-11 DIAGNOSIS — Z6836 Body mass index (BMI) 36.0-36.9, adult: Secondary | ICD-10-CM | POA: Diagnosis not present

## 2021-06-11 DIAGNOSIS — Z7982 Long term (current) use of aspirin: Secondary | ICD-10-CM | POA: Diagnosis not present

## 2021-06-11 DIAGNOSIS — M48061 Spinal stenosis, lumbar region without neurogenic claudication: Secondary | ICD-10-CM | POA: Diagnosis present

## 2021-06-11 DIAGNOSIS — I6523 Occlusion and stenosis of bilateral carotid arteries: Secondary | ICD-10-CM | POA: Diagnosis present

## 2021-06-11 DIAGNOSIS — Z7902 Long term (current) use of antithrombotics/antiplatelets: Secondary | ICD-10-CM | POA: Diagnosis not present

## 2021-06-11 DIAGNOSIS — K760 Fatty (change of) liver, not elsewhere classified: Secondary | ICD-10-CM | POA: Diagnosis present

## 2021-06-11 DIAGNOSIS — F17210 Nicotine dependence, cigarettes, uncomplicated: Secondary | ICD-10-CM | POA: Diagnosis present

## 2021-06-11 DIAGNOSIS — Z79899 Other long term (current) drug therapy: Secondary | ICD-10-CM | POA: Diagnosis not present

## 2021-06-11 DIAGNOSIS — M1612 Unilateral primary osteoarthritis, left hip: Secondary | ICD-10-CM | POA: Diagnosis present

## 2021-06-11 DIAGNOSIS — Z96653 Presence of artificial knee joint, bilateral: Secondary | ICD-10-CM | POA: Diagnosis present

## 2021-06-11 DIAGNOSIS — E781 Pure hyperglyceridemia: Secondary | ICD-10-CM | POA: Diagnosis present

## 2021-06-11 DIAGNOSIS — J449 Chronic obstructive pulmonary disease, unspecified: Secondary | ICD-10-CM | POA: Diagnosis present

## 2021-06-11 DIAGNOSIS — E119 Type 2 diabetes mellitus without complications: Secondary | ICD-10-CM | POA: Diagnosis present

## 2021-06-11 LAB — SARS CORONAVIRUS 2 (TAT 6-24 HRS): SARS Coronavirus 2: NEGATIVE

## 2021-06-11 MED ORDER — CHLORHEXIDINE GLUCONATE 0.12 % MT SOLN: 15.0000 mL | Freq: Once | OROMUCOSAL | Status: AC

## 2021-06-11 MED ORDER — CEFAZOLIN SODIUM-DEXTROSE 2-4 GM/100ML-% IV SOLN
2.0000 g | INTRAVENOUS | Status: AC
  Administered 2021-06-12: 2 g via INTRAVENOUS

## 2021-06-11 MED ORDER — SODIUM CHLORIDE 0.9 % IV SOLN: INTRAVENOUS | Status: DC

## 2021-06-11 MED ORDER — ORAL CARE MOUTH RINSE: 15.0000 mL | Freq: Once | OROMUCOSAL | Status: AC

## 2021-06-12 ENCOUNTER — Encounter
Admission: RE | Disposition: A | Discharge status: Home / Self Care | Payer: Self-pay | Source: Home / Self Care | Attending: Surgery

## 2021-06-12 ENCOUNTER — Inpatient Hospital Stay: Payer: Medicare HMO | Admitting: Urgent Care

## 2021-06-12 ENCOUNTER — Other Ambulatory Visit: Payer: Self-pay

## 2021-06-12 ENCOUNTER — Inpatient Hospital Stay
Admission: RE | Admit: 2021-06-12 | Discharge: 2021-06-13 | DRG: 470 | Disposition: A | Discharge status: Home Health | Payer: Medicare HMO | Attending: Surgery | Admitting: Surgery

## 2021-06-12 ENCOUNTER — Encounter: Payer: Self-pay | Admitting: Surgery

## 2021-06-12 ENCOUNTER — Inpatient Hospital Stay: Payer: Medicare HMO

## 2021-06-12 DIAGNOSIS — E119 Type 2 diabetes mellitus without complications: Secondary | ICD-10-CM | POA: Diagnosis present

## 2021-06-12 DIAGNOSIS — Z8616 Personal history of COVID-19: Secondary | ICD-10-CM | POA: Diagnosis not present

## 2021-06-12 DIAGNOSIS — E781 Pure hyperglyceridemia: Secondary | ICD-10-CM | POA: Diagnosis present

## 2021-06-12 DIAGNOSIS — J849 Interstitial pulmonary disease, unspecified: Secondary | ICD-10-CM | POA: Diagnosis present

## 2021-06-12 DIAGNOSIS — K760 Fatty (change of) liver, not elsewhere classified: Secondary | ICD-10-CM | POA: Diagnosis present

## 2021-06-12 DIAGNOSIS — I6523 Occlusion and stenosis of bilateral carotid arteries: Secondary | ICD-10-CM | POA: Diagnosis present

## 2021-06-12 DIAGNOSIS — Z801 Family history of malignant neoplasm of trachea, bronchus and lung: Secondary | ICD-10-CM | POA: Diagnosis not present

## 2021-06-12 DIAGNOSIS — G4733 Obstructive sleep apnea (adult) (pediatric): Secondary | ICD-10-CM | POA: Diagnosis present

## 2021-06-12 DIAGNOSIS — Z7902 Long term (current) use of antithrombotics/antiplatelets: Secondary | ICD-10-CM

## 2021-06-12 DIAGNOSIS — M1612 Unilateral primary osteoarthritis, left hip: Secondary | ICD-10-CM | POA: Diagnosis present

## 2021-06-12 DIAGNOSIS — I2510 Atherosclerotic heart disease of native coronary artery without angina pectoris: Secondary | ICD-10-CM | POA: Diagnosis present

## 2021-06-12 DIAGNOSIS — I700 Atherosclerosis of aorta: Secondary | ICD-10-CM | POA: Diagnosis present

## 2021-06-12 DIAGNOSIS — Z96642 Presence of left artificial hip joint: Secondary | ICD-10-CM

## 2021-06-12 DIAGNOSIS — I10 Essential (primary) hypertension: Secondary | ICD-10-CM | POA: Diagnosis present

## 2021-06-12 DIAGNOSIS — Z6836 Body mass index (BMI) 36.0-36.9, adult: Secondary | ICD-10-CM | POA: Diagnosis not present

## 2021-06-12 DIAGNOSIS — Z79899 Other long term (current) drug therapy: Secondary | ICD-10-CM | POA: Diagnosis not present

## 2021-06-12 DIAGNOSIS — E785 Hyperlipidemia, unspecified: Secondary | ICD-10-CM | POA: Diagnosis present

## 2021-06-12 DIAGNOSIS — F17210 Nicotine dependence, cigarettes, uncomplicated: Secondary | ICD-10-CM | POA: Diagnosis present

## 2021-06-12 DIAGNOSIS — Z7982 Long term (current) use of aspirin: Secondary | ICD-10-CM | POA: Diagnosis not present

## 2021-06-12 DIAGNOSIS — Z96653 Presence of artificial knee joint, bilateral: Secondary | ICD-10-CM | POA: Diagnosis present

## 2021-06-12 DIAGNOSIS — K219 Gastro-esophageal reflux disease without esophagitis: Secondary | ICD-10-CM | POA: Diagnosis present

## 2021-06-12 DIAGNOSIS — Z8249 Family history of ischemic heart disease and other diseases of the circulatory system: Secondary | ICD-10-CM

## 2021-06-12 DIAGNOSIS — Z806 Family history of leukemia: Secondary | ICD-10-CM

## 2021-06-12 DIAGNOSIS — Z7984 Long term (current) use of oral hypoglycemic drugs: Secondary | ICD-10-CM

## 2021-06-12 DIAGNOSIS — Z818 Family history of other mental and behavioral disorders: Secondary | ICD-10-CM

## 2021-06-12 DIAGNOSIS — M48061 Spinal stenosis, lumbar region without neurogenic claudication: Secondary | ICD-10-CM | POA: Diagnosis present

## 2021-06-12 DIAGNOSIS — J449 Chronic obstructive pulmonary disease, unspecified: Secondary | ICD-10-CM | POA: Diagnosis present

## 2021-06-12 DIAGNOSIS — Z888 Allergy status to other drugs, medicaments and biological substances status: Secondary | ICD-10-CM

## 2021-06-12 HISTORY — DX: Occlusion and stenosis of bilateral carotid arteries: I65.23

## 2021-06-12 HISTORY — DX: Dependence on other enabling machines and devices: Z99.89

## 2021-06-12 HISTORY — DX: Long term (current) use of anticoagulants: Z79.01

## 2021-06-12 HISTORY — DX: Obstructive sleep apnea (adult) (pediatric): G47.33

## 2021-06-12 HISTORY — DX: Overactive bladder: N32.81

## 2021-06-12 HISTORY — DX: Anxiety disorder, unspecified: F41.9

## 2021-06-12 HISTORY — PX: TOTAL HIP ARTHROPLASTY: SHX124

## 2021-06-12 LAB — GLUCOSE, CAPILLARY
Glucose-Capillary: 168 mg/dL — ABNORMAL HIGH (ref 70–99)
Glucose-Capillary: 160 mg/dL — ABNORMAL HIGH (ref 70–99)
Glucose-Capillary: 201 mg/dL — ABNORMAL HIGH (ref 70–99)
Glucose-Capillary: 199 mg/dL — ABNORMAL HIGH (ref 70–99)

## 2021-06-12 LAB — ABO/RH: ABO/RH(D): O NEG

## 2021-06-12 SURGERY — ARTHROPLASTY, HIP, TOTAL,POSTERIOR APPROACH
Anesthesia: General | Site: Hip | Laterality: Left

## 2021-06-12 MED ORDER — HYDROMORPHONE HCL 1 MG/ML IJ SOLN: 0.2500 mg | INTRAMUSCULAR | Status: DC | PRN

## 2021-06-12 MED ORDER — PHENYLEPHRINE HCL (PRESSORS) 10 MG/ML IV SOLN
INTRAVENOUS | Status: AC
  Filled 2021-06-12: qty 1

## 2021-06-12 MED ORDER — PNEUMOCOCCAL VAC POLYVALENT 25 MCG/0.5ML IJ INJ: 0.5000 mL | INJECTION | INTRAMUSCULAR | Status: DC

## 2021-06-12 MED ORDER — METOCLOPRAMIDE HCL 5 MG/ML IJ SOLN: 5.0000 mg | Freq: Three times a day (TID) | INTRAMUSCULAR | Status: DC | PRN

## 2021-06-12 MED ORDER — SODIUM CHLORIDE 0.9 % IV SOLN
INTRAVENOUS | Status: DC | PRN
  Administered 2021-06-12: 20 mL

## 2021-06-12 MED ORDER — ONDANSETRON HCL 4 MG/2ML IJ SOLN
INTRAMUSCULAR | Status: DC | PRN
  Administered 2021-06-12: 4 mg via INTRAVENOUS

## 2021-06-12 MED ORDER — TRAMADOL HCL 50 MG PO TABS: 50.0000 mg | ORAL_TABLET | Freq: Four times a day (QID) | ORAL | Status: DC | PRN

## 2021-06-12 MED ORDER — ACETAMINOPHEN 10 MG/ML IV SOLN
INTRAVENOUS | Status: DC | PRN
  Administered 2021-06-12: 1000 mg via INTRAVENOUS

## 2021-06-12 MED ORDER — OXYCODONE HCL 5 MG PO TABS
5.0000 mg | ORAL_TABLET | ORAL | Status: DC | PRN
  Administered 2021-06-12 – 2021-06-13 (×2): 10 mg via ORAL
  Administered 2021-06-13: 5 mg via ORAL
  Filled 2021-06-12: qty 1
  Filled 2021-06-12 (×2): qty 2

## 2021-06-12 MED ORDER — MIDAZOLAM HCL 2 MG/2ML IJ SOLN
INTRAMUSCULAR | Status: DC | PRN
  Administered 2021-06-12: 2 mg via INTRAVENOUS

## 2021-06-12 MED ORDER — FENTANYL CITRATE (PF) 100 MCG/2ML IJ SOLN
INTRAMUSCULAR | Status: AC
  Administered 2021-06-12: 25 ug via INTRAVENOUS
  Filled 2021-06-12: qty 2

## 2021-06-12 MED ORDER — MAGNESIUM HYDROXIDE 400 MG/5ML PO SUSP: 30.0000 mL | Freq: Every day | ORAL | Status: DC | PRN

## 2021-06-12 MED ORDER — SODIUM CHLORIDE 0.9% FLUSH: INTRAVENOUS | Status: DC | PRN

## 2021-06-12 MED ORDER — ISOSORBIDE MONONITRATE ER 30 MG PO TB24
30.0000 mg | ORAL_TABLET | Freq: Every day | ORAL | Status: DC
  Administered 2021-06-13: 30 mg via ORAL
  Filled 2021-06-12: qty 1

## 2021-06-12 MED ORDER — BUPIVACAINE-EPINEPHRINE (PF) 0.5% -1:200000 IJ SOLN
INTRAMUSCULAR | Status: AC
  Filled 2021-06-12: qty 30

## 2021-06-12 MED ORDER — BUPIVACAINE-EPINEPHRINE (PF) 0.5% -1:200000 IJ SOLN
INTRAMUSCULAR | Status: DC | PRN
  Administered 2021-06-12: 30 mL via PERINEURAL

## 2021-06-12 MED ORDER — OXYCODONE HCL 5 MG/5ML PO SOLN: 5.0000 mg | Freq: Once | ORAL | Status: DC | PRN

## 2021-06-12 MED ORDER — POTASSIUM 99 MG PO TABS: 99.0000 mg | ORAL_TABLET | Freq: Every day | ORAL | Status: DC

## 2021-06-12 MED ORDER — FENTANYL CITRATE (PF) 100 MCG/2ML IJ SOLN
25.0000 ug | INTRAMUSCULAR | Status: AC | PRN
  Administered 2021-06-12 (×4): 25 ug via INTRAVENOUS

## 2021-06-12 MED ORDER — FENTANYL CITRATE (PF) 100 MCG/2ML IJ SOLN
INTRAMUSCULAR | Status: AC
  Administered 2021-06-12: 50 ug via INTRAVENOUS
  Filled 2021-06-12: qty 2

## 2021-06-12 MED ORDER — CEFAZOLIN SODIUM-DEXTROSE 2-4 GM/100ML-% IV SOLN
INTRAVENOUS | Status: AC
  Filled 2021-06-12: qty 100

## 2021-06-12 MED ORDER — KETOROLAC TROMETHAMINE 15 MG/ML IJ SOLN
INTRAMUSCULAR | Status: AC
  Filled 2021-06-12: qty 1

## 2021-06-12 MED ORDER — METOCLOPRAMIDE HCL 10 MG PO TABS: 5.0000 mg | ORAL_TABLET | Freq: Three times a day (TID) | ORAL | Status: DC | PRN

## 2021-06-12 MED ORDER — LACTATED RINGERS IV SOLN: INTRAVENOUS | Status: DC | PRN

## 2021-06-12 MED ORDER — PROPOFOL 10 MG/ML IV BOLUS
INTRAVENOUS | Status: DC | PRN
  Administered 2021-06-12: 140 mg via INTRAVENOUS

## 2021-06-12 MED ORDER — PHENYLEPHRINE HCL (PRESSORS) 10 MG/ML IV SOLN
INTRAVENOUS | Status: DC | PRN
  Administered 2021-06-12 (×2): 100 ug via INTRAVENOUS

## 2021-06-12 MED ORDER — KETOROLAC TROMETHAMINE 15 MG/ML IJ SOLN: 15.0000 mg | Freq: Once | INTRAMUSCULAR | Status: DC

## 2021-06-12 MED ORDER — ACETAMINOPHEN 10 MG/ML IV SOLN: 1000.0000 mg | Freq: Once | INTRAVENOUS | Status: DC | PRN

## 2021-06-12 MED ORDER — FENTANYL CITRATE (PF) 100 MCG/2ML IJ SOLN
INTRAMUSCULAR | Status: DC | PRN
  Administered 2021-06-12 (×3): 50 ug via INTRAVENOUS
  Administered 2021-06-12: 100 ug via INTRAVENOUS

## 2021-06-12 MED ORDER — DIPHENHYDRAMINE HCL 12.5 MG/5ML PO ELIX: 12.5000 mg | ORAL_SOLUTION | ORAL | Status: DC | PRN

## 2021-06-12 MED ORDER — INSULIN ASPART 100 UNIT/ML IJ SOLN
0.0000 [IU] | Freq: Three times a day (TID) | INTRAMUSCULAR | Status: DC
  Administered 2021-06-12: 5 [IU] via SUBCUTANEOUS
  Filled 2021-06-12: qty 1

## 2021-06-12 MED ORDER — SODIUM CHLORIDE 0.9 % IV SOLN: INTRAVENOUS | Status: DC

## 2021-06-12 MED ORDER — BISACODYL 10 MG RE SUPP: 10.0000 mg | Freq: Every day | RECTAL | Status: DC | PRN

## 2021-06-12 MED ORDER — DOCUSATE SODIUM 100 MG PO CAPS
100.0000 mg | ORAL_CAPSULE | Freq: Two times a day (BID) | ORAL | Status: DC
  Administered 2021-06-12 – 2021-06-13 (×2): 100 mg via ORAL
  Filled 2021-06-12 (×2): qty 1

## 2021-06-12 MED ORDER — TRANEXAMIC ACID 1000 MG/10ML IV SOLN
INTRAVENOUS | Status: AC
  Filled 2021-06-12: qty 10

## 2021-06-12 MED ORDER — ALBUTEROL SULFATE (2.5 MG/3ML) 0.083% IN NEBU: 2.5000 mg | INHALATION_SOLUTION | RESPIRATORY_TRACT | Status: DC | PRN

## 2021-06-12 MED ORDER — FLEET ENEMA 7-19 GM/118ML RE ENEM: 1.0000 | ENEMA | Freq: Once | RECTAL | Status: DC | PRN

## 2021-06-12 MED ORDER — TRANEXAMIC ACID 1000 MG/10ML IV SOLN
INTRAVENOUS | Status: DC | PRN
  Administered 2021-06-12: 1000 mg via TOPICAL

## 2021-06-12 MED ORDER — ROSUVASTATIN CALCIUM 10 MG PO TABS
20.0000 mg | ORAL_TABLET | Freq: Every day | ORAL | Status: DC
  Administered 2021-06-13: 20 mg via ORAL
  Filled 2021-06-12: qty 2

## 2021-06-12 MED ORDER — OXYMETAZOLINE HCL 0.05 % NA SOLN
1.0000 | Freq: Two times a day (BID) | NASAL | Status: DC
  Administered 2021-06-13: 1 via NASAL
  Filled 2021-06-12: qty 15

## 2021-06-12 MED ORDER — PROPRANOLOL HCL 20 MG PO TABS
40.0000 mg | ORAL_TABLET | Freq: Every day | ORAL | Status: DC
  Administered 2021-06-13: 40 mg via ORAL
  Filled 2021-06-12: qty 2

## 2021-06-12 MED ORDER — CEFAZOLIN SODIUM-DEXTROSE 2-4 GM/100ML-% IV SOLN
2.0000 g | Freq: Four times a day (QID) | INTRAVENOUS | Status: DC
  Filled 2021-06-12: qty 100

## 2021-06-12 MED ORDER — LIDOCAINE HCL (CARDIAC) PF 100 MG/5ML IV SOSY
PREFILLED_SYRINGE | INTRAVENOUS | Status: DC | PRN
  Administered 2021-06-12: 100 mg via INTRAVENOUS

## 2021-06-12 MED ORDER — IRBESARTAN 150 MG PO TABS
300.0000 mg | ORAL_TABLET | Freq: Every day | ORAL | Status: DC
  Administered 2021-06-13: 300 mg via ORAL
  Filled 2021-06-12: qty 2

## 2021-06-12 MED ORDER — DULOXETINE HCL 60 MG PO CPEP
60.0000 mg | ORAL_CAPSULE | Freq: Every day | ORAL | Status: DC
  Administered 2021-06-12 – 2021-06-13 (×2): 60 mg via ORAL
  Filled 2021-06-12 (×2): qty 1

## 2021-06-12 MED ORDER — MIDAZOLAM HCL 2 MG/2ML IJ SOLN
INTRAMUSCULAR | Status: AC
  Filled 2021-06-12: qty 2

## 2021-06-12 MED ORDER — CALCIUM 600-400 MG-UNIT PO CHEW: CHEWABLE_TABLET | Freq: Every day | ORAL | Status: DC

## 2021-06-12 MED ORDER — FENTANYL CITRATE (PF) 100 MCG/2ML IJ SOLN
INTRAMUSCULAR | Status: AC
  Filled 2021-06-12: qty 2

## 2021-06-12 MED ORDER — PROPOFOL 10 MG/ML IV BOLUS
INTRAVENOUS | Status: AC
  Filled 2021-06-12: qty 20

## 2021-06-12 MED ORDER — EPHEDRINE SULFATE 50 MG/ML IJ SOLN
INTRAMUSCULAR | Status: DC | PRN
  Administered 2021-06-12 (×2): 10 mg via INTRAVENOUS

## 2021-06-12 MED ORDER — PANTOPRAZOLE SODIUM 40 MG PO TBEC
40.0000 mg | DELAYED_RELEASE_TABLET | Freq: Two times a day (BID) | ORAL | Status: DC
  Administered 2021-06-12 – 2021-06-13 (×2): 40 mg via ORAL
  Filled 2021-06-12 (×2): qty 1

## 2021-06-12 MED ORDER — ONDANSETRON HCL 4 MG PO TABS: 4.0000 mg | ORAL_TABLET | Freq: Four times a day (QID) | ORAL | Status: DC | PRN

## 2021-06-12 MED ORDER — 0.9 % SODIUM CHLORIDE (POUR BTL) OPTIME
TOPICAL | Status: DC | PRN
  Administered 2021-06-12: 500 mL

## 2021-06-12 MED ORDER — ALBUTEROL SULFATE HFA 108 (90 BASE) MCG/ACT IN AERS: 2.0000 | INHALATION_SPRAY | RESPIRATORY_TRACT | Status: DC | PRN

## 2021-06-12 MED ORDER — SODIUM CHLORIDE 0.9 % IR SOLN
Status: DC | PRN
  Administered 2021-06-12: 1000 mL

## 2021-06-12 MED ORDER — ALBUTEROL SULFATE HFA 108 (90 BASE) MCG/ACT IN AERS
INHALATION_SPRAY | RESPIRATORY_TRACT | Status: DC | PRN
  Administered 2021-06-12: 5 via RESPIRATORY_TRACT

## 2021-06-12 MED ORDER — DEXMEDETOMIDINE (PRECEDEX) IN NS 20 MCG/5ML (4 MCG/ML) IV SYRINGE
PREFILLED_SYRINGE | INTRAVENOUS | Status: DC | PRN
  Administered 2021-06-12 (×2): 8 ug via INTRAVENOUS

## 2021-06-12 MED ORDER — LAMOTRIGINE 25 MG PO TABS
25.0000 mg | ORAL_TABLET | Freq: Every day | ORAL | Status: DC
  Administered 2021-06-13: 25 mg via ORAL
  Filled 2021-06-12: qty 1

## 2021-06-12 MED ORDER — PROMETHAZINE HCL 25 MG/ML IJ SOLN: 6.2500 mg | INTRAMUSCULAR | Status: DC | PRN

## 2021-06-12 MED ORDER — ACETAMINOPHEN 325 MG PO TABS: 325.0000 mg | ORAL_TABLET | Freq: Four times a day (QID) | ORAL | Status: DC | PRN

## 2021-06-12 MED ORDER — ACETAMINOPHEN 10 MG/ML IV SOLN
INTRAVENOUS | Status: AC
  Filled 2021-06-12: qty 100

## 2021-06-12 MED ORDER — ACETAMINOPHEN 500 MG PO TABS
1000.0000 mg | ORAL_TABLET | Freq: Four times a day (QID) | ORAL | Status: AC
  Administered 2021-06-12 – 2021-06-13 (×4): 1000 mg via ORAL
  Filled 2021-06-12 (×4): qty 2

## 2021-06-12 MED ORDER — SODIUM CHLORIDE 0.9 % IV SOLN
INTRAVENOUS | Status: DC | PRN
  Administered 2021-06-12: 25 ug/min via INTRAVENOUS

## 2021-06-12 MED ORDER — ADULT MULTIVITAMIN W/MINERALS CH
ORAL_TABLET | Freq: Every day | ORAL | Status: DC
  Administered 2021-06-13: 1 via ORAL
  Filled 2021-06-12: qty 1

## 2021-06-12 MED ORDER — KETOROLAC TROMETHAMINE 15 MG/ML IJ SOLN
7.5000 mg | Freq: Four times a day (QID) | INTRAMUSCULAR | Status: AC
  Administered 2021-06-12 – 2021-06-13 (×4): 7.5 mg via INTRAVENOUS
  Filled 2021-06-12 (×4): qty 1

## 2021-06-12 MED ORDER — OXYBUTYNIN CHLORIDE ER 5 MG PO TB24
10.0000 mg | ORAL_TABLET | Freq: Every day | ORAL | Status: DC
  Administered 2021-06-13: 10 mg via ORAL
  Filled 2021-06-12: qty 2

## 2021-06-12 MED ORDER — ONDANSETRON HCL 4 MG/2ML IJ SOLN: 4.0000 mg | Freq: Four times a day (QID) | INTRAMUSCULAR | Status: DC | PRN

## 2021-06-12 MED ORDER — APIXABAN 2.5 MG PO TABS
2.5000 mg | ORAL_TABLET | Freq: Two times a day (BID) | ORAL | Status: DC
  Administered 2021-06-13: 2.5 mg via ORAL
  Filled 2021-06-12: qty 1

## 2021-06-12 MED ORDER — BUPIVACAINE LIPOSOME 1.3 % IJ SUSP
INTRAMUSCULAR | Status: AC
  Filled 2021-06-12: qty 20

## 2021-06-12 MED ORDER — SUCCINYLCHOLINE CHLORIDE 20 MG/ML IJ SOLN
INTRAMUSCULAR | Status: DC | PRN
  Administered 2021-06-12: 100 mg via INTRAVENOUS

## 2021-06-12 MED ORDER — DEXAMETHASONE SODIUM PHOSPHATE 10 MG/ML IJ SOLN
INTRAMUSCULAR | Status: DC | PRN
  Administered 2021-06-12: 10 mg via INTRAVENOUS

## 2021-06-12 MED ORDER — CYCLOBENZAPRINE HCL 10 MG PO TABS
10.0000 mg | ORAL_TABLET | Freq: Three times a day (TID) | ORAL | Status: DC | PRN
  Administered 2021-06-13: 10 mg via ORAL
  Filled 2021-06-12: qty 1

## 2021-06-12 MED ORDER — METFORMIN HCL ER 500 MG PO TB24
1000.0000 mg | ORAL_TABLET | Freq: Two times a day (BID) | ORAL | Status: DC
  Administered 2021-06-12 – 2021-06-13 (×2): 1000 mg via ORAL
  Filled 2021-06-12 (×4): qty 2

## 2021-06-12 MED ORDER — AMLODIPINE BESYLATE 5 MG PO TABS
5.0000 mg | ORAL_TABLET | Freq: Every day | ORAL | Status: DC
  Administered 2021-06-13: 5 mg via ORAL
  Filled 2021-06-12: qty 1

## 2021-06-12 MED ORDER — SUGAMMADEX SODIUM 200 MG/2ML IV SOLN
INTRAVENOUS | Status: DC | PRN
  Administered 2021-06-12: 193.2 mg via INTRAVENOUS

## 2021-06-12 MED ORDER — TRAZODONE HCL 100 MG PO TABS
200.0000 mg | ORAL_TABLET | Freq: Every day | ORAL | Status: DC
  Administered 2021-06-12: 200 mg via ORAL
  Filled 2021-06-12: qty 2

## 2021-06-12 MED ORDER — OXYCODONE HCL 5 MG PO TABS: 5.0000 mg | ORAL_TABLET | Freq: Once | ORAL | Status: DC | PRN

## 2021-06-12 MED ORDER — CHLORHEXIDINE GLUCONATE 0.12 % MT SOLN
OROMUCOSAL | Status: AC
  Administered 2021-06-12: 15 mL via OROMUCOSAL
  Filled 2021-06-12: qty 15

## 2021-06-12 MED ORDER — ROCURONIUM BROMIDE 100 MG/10ML IV SOLN
INTRAVENOUS | Status: DC | PRN
  Administered 2021-06-12: 10 mg via INTRAVENOUS
  Administered 2021-06-12: 40 mg via INTRAVENOUS
  Administered 2021-06-12: 30 mg via INTRAVENOUS
  Administered 2021-06-12: 20 mg via INTRAVENOUS

## 2021-06-12 MED ORDER — GABAPENTIN 300 MG PO CAPS
300.0000 mg | ORAL_CAPSULE | Freq: Every day | ORAL | Status: DC
  Administered 2021-06-12: 300 mg via ORAL
  Filled 2021-06-12: qty 1

## 2021-06-12 MED ORDER — CALCIUM CITRATE-VITAMIN D 500-500 MG-UNIT PO CHEW
1.0000 | CHEWABLE_TABLET | Freq: Every day | ORAL | Status: DC
  Administered 2021-06-13: 1 via ORAL
  Filled 2021-06-12: qty 1

## 2021-06-12 MED ORDER — SODIUM CHLORIDE FLUSH 0.9 % IV SOLN
INTRAVENOUS | Status: AC
  Filled 2021-06-12: qty 40

## 2021-06-12 MED ORDER — CEFAZOLIN SODIUM-DEXTROSE 2-4 GM/100ML-% IV SOLN
2.0000 g | Freq: Four times a day (QID) | INTRAVENOUS | Status: AC
  Administered 2021-06-12 – 2021-06-13 (×2): 2 g via INTRAVENOUS
  Filled 2021-06-12: qty 100

## 2021-06-12 SURGICAL SUPPLY — 59 items
APL PRP STRL LF DISP 70% ISPRP (MISCELLANEOUS) ×1
BLADE SAGITTAL WIDE XTHICK NO (BLADE) ×2 IMPLANT
BLADE SURG SZ20 CARB STEEL (BLADE) ×2 IMPLANT
CHLORAPREP W/TINT 26 (MISCELLANEOUS) ×2 IMPLANT
DRAPE 3/4 80X56 (DRAPES) ×2 IMPLANT
DRAPE IMP U-DRAPE 54X76 (DRAPES) IMPLANT
DRAPE INCISE IOBAN 66X60 STRL (DRAPES) ×2 IMPLANT
DRAPE SURG 17X11 SM STRL (DRAPES) ×4 IMPLANT
DRSG MEPILEX SACRM 8.7X9.8 (GAUZE/BANDAGES/DRESSINGS) ×2 IMPLANT
DRSG OPSITE POSTOP 4X10 (GAUZE/BANDAGES/DRESSINGS) ×2 IMPLANT
ELECT BLADE 6.5 EXT (BLADE) ×2 IMPLANT
ELECT CAUTERY BLADE 6.4 (BLADE) ×2 IMPLANT
GAUZE 4X4 16PLY ~~LOC~~+RFID DBL (SPONGE) ×2 IMPLANT
GLOVE SRG 8 PF TXTR STRL LF DI (GLOVE) ×1 IMPLANT
GLOVE SURG ENC MOIS LTX SZ7.5 (GLOVE) ×8 IMPLANT
GLOVE SURG ENC MOIS LTX SZ8 (GLOVE) ×8 IMPLANT
GLOVE SURG UNDER LTX SZ8 (GLOVE) ×2 IMPLANT
GLOVE SURG UNDER POLY LF SZ8 (GLOVE) ×2
GOWN STRL REUS W/ TWL LRG LVL3 (GOWN DISPOSABLE) ×1 IMPLANT
GOWN STRL REUS W/ TWL XL LVL3 (GOWN DISPOSABLE) ×1 IMPLANT
GOWN STRL REUS W/TWL LRG LVL3 (GOWN DISPOSABLE) ×2
GOWN STRL REUS W/TWL XL LVL3 (GOWN DISPOSABLE) ×2
HEAD FEM 32X0 DELTA TI (Head) ×1 IMPLANT
HIP SHELL ACETAB 3H 48MM C (Hips) ×2 IMPLANT
HOOD PEEL AWAY FLYTE STAYCOOL (MISCELLANEOUS) ×7 IMPLANT
IRRIGATION SURGIPHOR STRL (IV SOLUTION) IMPLANT
IV NS IRRIG 3000ML ARTHROMATIC (IV SOLUTION) ×2 IMPLANT
KIT TURNOVER KIT A (KITS) ×2 IMPLANT
LINER ACE G7 32 SZC HIGH WALL (Liner) ×1 IMPLANT
MANIFOLD NEPTUNE II (INSTRUMENTS) ×2 IMPLANT
MAT ABSORB  FLUID 56X50 GRAY (MISCELLANEOUS) ×2
MAT ABSORB FLUID 56X50 GRAY (MISCELLANEOUS) ×1 IMPLANT
NDL SAFETY ECLIPSE 18X1.5 (NEEDLE) ×2 IMPLANT
NDL SPNL 20GX3.5 QUINCKE YW (NEEDLE) ×1 IMPLANT
NEEDLE FILTER BLUNT 18X 1/2SAF (NEEDLE) ×1
NEEDLE FILTER BLUNT 18X1 1/2 (NEEDLE) ×1 IMPLANT
NEEDLE HYPO 18GX1.5 SHARP (NEEDLE) ×4
NEEDLE SPNL 20GX3.5 QUINCKE YW (NEEDLE) ×2 IMPLANT
PACK HIP PROSTHESIS (MISCELLANEOUS) ×2 IMPLANT
PENCIL SMOKE EVACUATOR (MISCELLANEOUS) ×2 IMPLANT
PIN STEINMAN 3/16 (PIN) ×2 IMPLANT
PIN STEINMANN 3/16X9 BAY 6PK (Pin) ×1 IMPLANT
PULSAVAC PLUS IRRIG FAN TIP (DISPOSABLE) ×2
SHELL ACETAB HIP 3H 48MM C (Hips) IMPLANT
SPONGE T-LAP 18X18 ~~LOC~~+RFID (SPONGE) ×10 IMPLANT
ST PIN 3/16X9 BAY 6PK (Pin) ×2 IMPLANT
STAPLER SKIN PROX 35W (STAPLE) ×2 IMPLANT
STEM COLLARLESS FULL 11X135 (Stem) ×1 IMPLANT
SUT TICRON 2-0 30IN 311381 (SUTURE) ×6 IMPLANT
SUT VIC AB 0 CT1 36 (SUTURE) ×2 IMPLANT
SUT VIC AB 1 CT1 36 (SUTURE) ×4 IMPLANT
SUT VIC AB 2-0 CT1 (SUTURE) ×10 IMPLANT
SYR 10ML LL (SYRINGE) ×2 IMPLANT
SYR 20ML LL LF (SYRINGE) ×2 IMPLANT
SYR 30ML LL (SYRINGE) ×4 IMPLANT
TAPE TRANSPORE STRL 2 31045 (GAUZE/BANDAGES/DRESSINGS) ×2 IMPLANT
TIP FAN IRRIG PULSAVAC PLUS (DISPOSABLE) ×1 IMPLANT
TRAP FLUID SMOKE EVACUATOR (MISCELLANEOUS) ×2 IMPLANT
WATER STERILE IRR 1000ML POUR (IV SOLUTION) ×2 IMPLANT

## 2021-06-12 NOTE — Anesthesia Procedure Notes (Signed)
Procedure Name: Intubation Date/Time: 06/12/2021 11:05 AM Performed by: Nelda Marseille, CRNA Pre-anesthesia Checklist: Patient identified, Patient being monitored, Timeout performed, Emergency Drugs available and Suction available Patient Re-evaluated:Patient Re-evaluated prior to induction Oxygen Delivery Method: Circle system utilized Preoxygenation: Pre-oxygenation with 100% oxygen Induction Type: IV induction Ventilation: Mask ventilation without difficulty Laryngoscope Size: Mac, 3 and McGraph Grade View: Grade I Tube type: Oral Tube size: 7.0 mm Number of attempts: 1 Airway Equipment and Method: Stylet Placement Confirmation: ETT inserted through vocal cords under direct vision, positive ETCO2 and breath sounds checked- equal and bilateral Secured at: 21 cm Tube secured with: Tape Dental Injury: Teeth and Oropharynx as per pre-operative assessment

## 2021-06-12 NOTE — Evaluation (Signed)
Physical Therapy Evaluation Patient Details Name: Kathryn Friedman MRN: [id number] DOB: 1956/10/28 Today's Date: 06/12/2021   History of Present Illness  65 y/o female s/p L total hip replacement 7/19, lateral approach.  Clinical Impression  Pt initially was hesitant to do a lot with PT, but after minimal explanation was willing to participate.  Educated on precautions early on, she was able to recall only 1 post session, issued HEP and precaution hand out.  Pt was able to ambulate 18 ft but did fatigue quickly and had small buckling episode, reliant on walker.  Pt did well with bed mobility, but did need assist with getting to standing from standard height surface.  Pt very much hoping to go home (avoid rehab) and per continued progress this should be possible.      Follow Up Recommendations Home health PT (per continued improvement)    Equipment Recommendations  None recommended by PT    Recommendations for Other Services Rehab consult     Precautions / Restrictions Precautions Precautions: Posterior Hip;Fall Precaution Booklet Issued: Yes (comment) Restrictions Weight Bearing Restrictions: Yes LLE Weight Bearing: Weight bearing as tolerated      Mobility  Bed Mobility Overal bed mobility: Modified Independent             General bed mobility comments: Pt was able  to maintain hip precautions and get herself to EOB w/o direct assist, heavy UE use on rails    Transfers Overall transfer level: Needs assistance Equipment used: Rolling walker (2 wheeled) Transfers: Sit to/from Stand Sit to Stand: Min assist         General transfer comment: Pt attempted x 2 on her own w/o ability to attain standing, light assist and increased cuing were successful on 3rd attempt.  Ambulation/Gait Ambulation/Gait assistance: Min assist Gait Distance (Feet): 18 Feet Assistive device: Rolling walker (2 wheeled)       General Gait Details: Pt with slow, guarded gait into  the hallway.  She was very reliant on the walker/UEs, she did have one brief buckling episode that she self arrested but clearly she was fatigued withthe effort.  Stairs            Wheelchair Mobility    Modified Rankin (Stroke Patients Only)       Balance Overall balance assessment: Needs assistance   Sitting balance-Leahy Scale: Good     Standing balance support: Bilateral upper extremity supported Standing balance-Leahy Scale: Fair                               Pertinent Vitals/Pain Pain Assessment: 0-10 Pain Score: 6     Home Living Family/patient expects to be discharged to:: Private residence Living Arrangements: Spouse/significant other Available Help at Discharge: Available 24 hours/day;Neighbor;Family   Home Access: Stairs to enter Entrance Stairs-Rails: Can reach both Entrance Stairs-Number of Steps: 4   Home Equipment: Walker - 2 wheels;Bedside commode      Prior Function Level of Independence: Independent         Comments: Pt reports that she did not need AD and can be active in the community, but has had increasing staggering/catching herself on walls/furniture/etc but     Hand Dominance        Extremity/Trunk Assessment   Upper Extremity Assessment Upper Extremity Assessment: Overall WFL for tasks assessed    Lower Extremity Assessment Lower Extremity Assessment:  (expected post-op weakness on L, R grossly 4/5)  Communication   Communication: No difficulties  Cognition Arousal/Alertness: Awake/alert Behavior During Therapy: WFL for tasks assessed/performed Overall Cognitive Status: Within Functional Limits for tasks assessed                                        General Comments      Exercises Total Joint Exercises Ankle Circles/Pumps: AROM;10 reps Quad Sets: Strengthening;5 reps Short Arc Quad: Strengthening;5 reps Heel Slides: AROM;5 reps (with lightly resisted leg ext) Hip  ABduction/ADduction: Strengthening;AROM;5 reps   Assessment/Plan    PT Assessment Patient needs continued PT services  PT Problem List Decreased strength;Decreased range of motion;Decreased activity tolerance;Decreased balance;Decreased mobility;Decreased coordination       PT Treatment Interventions Gait training;Stair training;DME instruction;Functional mobility training;Therapeutic activities;Therapeutic exercise;Balance training;Neuromuscular re-education;Patient/family education    PT Goals (Current goals can be found in the Care Plan section)  Acute Rehab PT Goals Patient Stated Goal: go home, not rehab PT Goal Formulation: With patient    Frequency BID   Barriers to discharge        Co-evaluation               AM-PAC PT "6 Clicks" Mobility  Outcome Measure Help needed turning from your back to your side while in a flat bed without using bedrails?: None Help needed moving from lying on your back to sitting on the side of a flat bed without using bedrails?: A Little Help needed moving to and from a bed to a chair (including a wheelchair)?: A Little Help needed standing up from a chair using your arms (e.g., wheelchair or bedside chair)?: A Little Help needed to walk in hospital room?: A Lot Help needed climbing 3-5 steps with a railing? : A Lot 6 Click Score: 17    End of Session Equipment Utilized During Treatment: Gait belt;Oxygen Activity Tolerance: Patient limited by fatigue;Patient tolerated treatment well Patient left: with chair alarm set;with call bell/phone within reach;with nursing/sitter in room Nurse Communication: Mobility status PT Visit Diagnosis: Muscle weakness (generalized) (M62.81);Difficulty in walking, not elsewhere classified (R26.2);Pain Pain - Right/Left: Left Pain - part of body: Hip    Time: 5188-4166 PT Time Calculation (min) (ACUTE ONLY): 40 min   Charges:   PT Evaluation $PT Eval Low Complexity: 1 Low PT Treatments $Gait  Training: 8-22 mins $Therapeutic Exercise: 8-22 mins        Kreg Shropshire, DPT 06/12/2021, 5:50 PM

## 2021-06-12 NOTE — Op Note (Signed)
06/12/2021  1:02 PM  Patient:   Kathryn Friedman  Pre-Op Diagnosis:   Degenerative joint disease, left hip.  Post-Op Diagnosis:   Same.  Procedure:   Left total hip arthroplasty.  Surgeon:   Pascal Lux, MD  Assistant:   Cameron Proud, PA-C; Ardelle Park, PA-S  Anesthesia:   GET  Findings:   As above.  Complications:   None  EBL:   200 cc  Fluids:   1000 cc crystalloid  UOP:   None cc  TT:   None  Drains:   None  Closure:   Staples  Implants:   Biomet press-fit system with a #11 offset Echo femoral stem, a 48 mm acetabular shell with an E-poly hi-wall liner, and a 32 mm ceramic head with a +0 mm neck.  Brief Clinical Note:   The patient is a 65 year old female with a history of progressive worsening left hip/groin pain.  Her symptoms have progressed despite medications, activity modification, etc.  Her history and examination are consistent with moderate-severe degenerative joint disease confirmed by plain radiographs.  The patient presents at this time for a left total hip arthroplasty.   Procedure:   The patient was brought into the operating room and lain in the supine position. After adequate general endotracheal intubation and anesthesia was obtained, the patient was repositioned in the right lateral decubitus position and secured using a lateral hip positioner. The left hip and lower extremity were prepped with ChloroPrep solution before being draped sterilely. Preoperative antibiotics were administered. A timeout was performed to verify the appropriate surgical site.    A standard posterior approach to the hip was made through an approximately 4-5 inch incision. The incision was carried down through the subcutaneous tissues to expose the gluteal fascia and proximal end of the iliotibial band. These structures were split the length of the incision and the Charnley self-retaining hip retractor placed. The bursal tissues were swept posteriorly to expose the  short external rotators. The anterior border of the piriformis tendon was identified and this plane developed down through the capsule to enter the joint. A flap of tissue was elevated off the posterior aspect of the femoral neck and greater trochanter and retracted posteriorly. This flap included the piriformis tendon, the short external rotators, and the posterior capsule. The soft tissues were elevated off the lateral aspect of the ilium and a large Steinmann pin placed bicortically.   With the left leg aligned over the right, a drill bit was placed into the greater trochanter parallel to the Steinmann pin and the distance between these two pins measured in order to optimize leg lengths postoperatively. The drill bit was removed and the hip dislocated. The piriformis fossa was debrided of soft tissues before the intramedullary canal was accessed through this point using a triple step reamer. The canal was reamed sequentially beginning with a #7 tapered reamer and progressing to a #11 tapered reamer. This provided excellent circumferential chatter. Using the appropriate guide, a femoral neck cut was made 10-12 mm above the lesser trochanter. The femoral head was removed.  Attention was directed to the acetabular side. The labrum was debrided circumferentially before the ligamentum teres was removed using a large curette. A line was drawn on the drapes corresponding to the native version of the acetabulum. This line was used as a guide while the acetabulum was reamed sequentially beginning with a 40 mm reamer and progressing to a 47 mm reamer. This provided excellent circumferential chatter. The 47 mm  trial acetabulum was positioned and found to fit quite well. Therefore, the 48 mm acetabular shell was selected and impacted into place with care taken to maintain the appropriate version. The trial high wall liner was inserted.  Attention was redirected to the femoral side. A box osteotome was used to establish  version before the canal was broached sequentially beginning with a #7 broach and progressing to a #11 broach. This was left in place and several trial reductions performed using both a standard and laterally offset neck options, as well as the -6 mm, -3 mm, and +0 mm neck lengths. After removing the trial components, the "manhole cover" was placed into the apex of the acetabular shell and tightened securely. The permanent E-polyethylene hi-wall liner was impacted into the acetabular shell and its locking mechanism verified using a quarter-inch osteotome. Next, the #11 laterally offset femoral stem was impacted into place with care taken to maintain the appropriate version. A repeat trial reduction was performed using the +0 mm neck lengths. The +0 mm neck length demonstrated excellent stability both in extension and external rotation as well as with flexion to 90 and internal rotation beyond 70. It also was stable in the position of sleep. In addition, leg lengths appeared to be restored appropriately, both by reassessing the position of the right leg over the left, as well as by measuring the distance between the Steinmann pin and the drill bit. The 32 mm ceramic head with the +0 mm neck was impacted onto the stem of the femoral component. The Morse taper locking mechanism was verified using manual distraction before the head was relocated and placed through a range of motion with the findings as described above.  The wound was copiously irrigated with sterile saline solution via the jet lavage system before the peri-incisional and pericapsular tissues were injected with 30 cc of 0.5% Sensorcaine with epinephrine and 20 cc of Exparel diluted out to 60 cc with normal saline to help with postoperative analgesia. The posterior flap was reapproximated to the posterior aspect of the greater trochanter using #2 Tycron interrupted sutures placed through drill holes. Several additional #2 Tycron interrupted sutures  were used to reinforce this layer of closure. The iliotibial band was reapproximated using #1 Vicryl interrupted sutures before the gluteal fascia was closed using a running #1 Vicryl suture. At this point, 1 g of transexemic acid in 10 cc of normal saline was injected into the joint to help reduce postoperative bleeding. The subcutaneous tissues were closed in several layers using 2-0 Vicryl interrupted sutures before the skin was closed using staples. A sterile occlusive dressing was applied to the wound. The patient was then rolled back into the supine position on his/her hospital bed before being awakened and returned to the recovery room in satisfactory condition after tolerating the procedure well.

## 2021-06-12 NOTE — H&P (Signed)
History of Present Illness: Kathryn Friedman is a 65 y.o. female who is being referred by Cassell Smiles, PA, for evaluation and treatment of her left hip pain with radiation into the left groin region and extending down the anterior aspect of her thigh to her knee. The symptoms have been present for 4-5 months and developed without any apparent cause or injury. She saw Cassell Smiles, PA-C, for the symptoms initially. He had Dr. Candelaria Stagers perform an intra-articular injection which provided only mild temporary relief of her symptoms. Subsequently, the patient was sent for an MRI scan of her lumbar spine which demonstrated significant degenerative changes with with moderate to severe spinal stenosis and bilateral recess stenosis at L4-5 as well as moderate spinal stenosis at L2-3. She underwent an epidural injection which provided no substantial relief of her symptoms. Therefore, the patient has been referred to me for further evaluation and treatment.   The pain is moderate, worsening, constant and 4 on a scale of 1-10. She describes the pain as aching, dull and stabbing. The symptoms are aggravated by standing, walking, carrying heavy loads and daily activities. The symptoms improve with sitting, lying down, acetaminophen and rest. She also has been taking tramadol as necessary with temporary partial relief of her symptoms. She denies any numbness, weakness, paresthesias to either lower extremity. She reports recent changes in bowel or bladder function, but notes that she does have a "hyperactive bladder". She has tried epidural steroid injections in the past. She has not had any prior injury or symptoms to this part of the body in the past.  Current Outpatient Medications:  amLODIPine (NORVASC) 10 MG tablet Take 10 mg by mouth once daily.   aspirin 81 MG EC tablet Take 81 mg by mouth once daily.   budesonide-glycopyrrolate-formoterol (BREZTRI AEROSPHERE) 160-9-4.8 mcg/actuation inhaler Inhale into the lungs    calcium carbonate-vitamin D3 (OS-CAL 500+D) 500 mg(1,250mg ) -200 unit tablet Take 1 tablet by mouth once daily   clopidogrel (PLAVIX) 75 mg tablet Take 75 mg by mouth once daily.   cyclobenzaprine (FLEXERIL) 10 MG tablet Take 1 tablet by mouth 3 (three) times a day   diclofenac (VOLTAREN) 75 MG EC tablet Take 1 tablet (75 mg total) by mouth 2 (two) times daily with meals 60 tablet 1   dicyclomine (BENTYL) 20 mg tablet Take 1 tablet (20 mg total) by mouth every 6 (six) hours 30 tablet 3   DULoxetine (CYMBALTA) 60 MG DR capsule Take 1 capsule by mouth once daily   folic acid/multivit-min/lutein (CENTRUM SILVER ORAL) Take 1 tablet by mouth once daily   gabapentin (NEURONTIN) 300 MG capsule   hydrOXYzine pamoate (VISTARIL) 25 MG capsule TAKE 1 CAPSULE (25 MG TOTAL) BY MOUTH AT BEDTIME AS NEEDED. FOR SLEEP   isosorbide mononitrate (IMDUR) 30 MG ER tablet Take 1 tablet (30 mg total) by mouth once daily 90 tablet 1   L-LYSINE ORAL Take 1 tablet by mouth once daily   lamoTRIgine (LAMICTAL) 25 MG tablet Take 25 mg by mouth 2 (two) times daily   metFORMIN (GLUCOPHAGE) 500 MG tablet Take 1,000 mg by mouth 2 (two) times daily with meals   oxybutynin (DITROPAN-XL) 10 MG XL tablet Take 1 tablet by mouth once daily   pantoprazole (PROTONIX) 40 MG DR tablet Take 1 tablet (40 mg total) by mouth 2 (two) times daily before meals 60 tablet 3   POTASSIUM ORAL Take 1 tablet by mouth once daily One daily   rosuvastatin (CRESTOR) 20 MG tablet Take 20  mg by mouth once daily   telmisartan (MICARDIS) 80 MG tablet Take 1 tablet (80 mg total) by mouth once daily 90 tablet 4   traZODone (DESYREL) 100 MG tablet Take 200 mg by mouth nightly   TRUE METRIX GLUCOSE TEST STRIP test strip 1 strip 3 (three) times daily   TRUEPLUS LANCETS 1 each 3 (three) times daily   Allergies:   Varenicline Other ("I got really depressed" CHANTEX)  Empagliflozin Other (Yeast infection)   Methylprednisolone Nausea, Vomiting and Nausea And  Vomiting   Past Medical History:   Anemia 2014   Anxiety 2015   Arthritis   Asthma, unspecified asthma severity, unspecified whether complicated, unspecified whether persistent   Bilateral carotid artery stenosis   Chicken pox   Chronic diarrhea, unspecified   COPD (chronic obstructive pulmonary disease) (CMS-HCC)   Coronary artery disease   COVID-19 12/09/2020   Depression 2015   Diabetes mellitus without complication (CMS-HCC)   Diverticulosis 08/11/2014   Dysphagia   Fatty infiltration of liver 08/11/2014   GERD (gastroesophageal reflux disease)   Hyperlipidemia   Hypertension   Interstitial lung disease (CMS-HCC)   Obesity   Overactive bladder   PSVT (paroxysmal supraventricular tachycardia) (CMS-HCC)   Sleep apnea with use of continuous positive airway pressure (CPAP)   Spastic colon   Tobacco abuse   Venous insufficiency   Past Surgical History:   APPENDECTOMY 1985   cardiac cath and coronary angiography Left 05/14/2018   cardiac cath with stents 2007   CARDIAC CATHETERIZATION 2008   [place]   COLONOSCOPY 07/13/2002 (Normal Colon)   COLONOSCOPY 12/02/2013, 02/13/2009 (Diverticulosis: [facility] 11/2023)   COLONOSCOPY 09/13/2016 (Adenomatous Polyps: [facility] 08/2019)   COLONOSCOPY 11/09/2018 (Mild focal colitis; [place] Adenomatous Polyps: [facility] 10/2023)   COLONOSCOPY 03/29/2020 (Normal Colon/PHx CP/Repeat 23yrs/JWB)   EGD 12/02/2013, 07/13/2002   EGD 02/02/2018 (Gastritis, Esophagitis: No repeat per RTE)   EGD 03/29/2020 (Normal EGD/No Repeat/J[facility])   hysterectomy 1996 with left ovary in place   INCISION & DRAINAGE ABSCESS 07/20/2020 (right abdomen)   incision and drainage abdominal wall abscess Right 07/20/2020   INCISIONAL BIOPSY BREAST Left 10/14/2017   REPAIR ROTATOR CUFF TEAR ACUTE OPEN 1987   REPLACEMENT TOTAL KNEE BILATERAL 2002,2008   shoulder surgery secondary to a torn tendon   WISDOM TEETH   Family History   Breast  cancer Other   Colon cancer Other (paternal)   Cancer Other   Lung cancer Mother   Heart disease Mother   Mitral valve prolapse Mother   Depression Mother   Heart disease Father   Coronary Artery Disease (Blocked arteries around heart) Father   Myocardial Infarction (Heart attack) Father   Lung cancer Maternal Aunt   Lung cancer Maternal Grandmother   Leukemia Paternal Grandmother   Mitral valve prolapse Sister   Bipolar disorder Sister   Cirrhosis Brother   Hepatitis C Brother   Social History:   Socioeconomic History:   Marital status: Married  Tobacco Use   Smoking status: Current Every Day Smoker  Packs/day: 1.50  Years: 59.00  Pack years: 88.50  Types: Cigarettes   Smokeless tobacco: Never Used  Scientific laboratory technician Use: Never used  Substance and Sexual Activity   Alcohol use: Never   Drug use: No   Sexual activity: Yes  Partners: Male  Birth control/protection: None   Review of Systems:  A comprehensive 14 point ROS was performed, reviewed, and the pertinent orthopaedic findings are documented  in the HPI.  Physical Exam: Vitals:  05/18/21 1319  BP: 132/82  Weight: 99.4 kg (219 lb 3.2 oz)  Height: 162.6 cm (5\' 4" )  PainSc: 4  PainLoc: Hip   General/Constitutional: Pleasant overweight middle-aged female in no acute distress. Neuro/Psych: Normal mood and affect, oriented to person, place and time. Eyes: Non-icteric. Pupils are equal, round, and reactive to light, and exhibit synchronous movement. ENT: Unremarkable. Lymphatic: No palpable adenopathy. Respiratory: Lungs clear to auscultation, Normal chest excursion, No wheezes and Non-labored breathing Cardiovascular: Regular rate and rhythm. No murmurs. and No edema, swelling or tenderness, except as noted in detailed exam. Integumentary: No impressive skin lesions present, except as noted in detailed exam. Musculoskeletal: Unremarkable, except as noted in detailed exam.  Lumbar exam: Skin inspection of  the lower back is unremarkable. In stance, her spine is straight and her pelvis is level. She can arise from a seated position with mild difficulty, and demonstrates a moderate limp, but is not using any assistive devices. She has no tenderness along the thoracic or lumbar spine, nor across the sacrum. She has mild pain over the left SI joint, but there is no tenderness over the right SI joint or either sciatic notch region. She has moderate tenderness to palpation over the lateral aspect of the left hip, but there is no tenderness over the lateral aspect of the right hip. She can heel raise and toe raise without difficulty or weakness and with difficulty in balance.   Hip exam: She has moderate pain with left hip internal rotation and external rotation. She exhibits flexion to 100 degrees, internal rotation to 0 degrees, and external rotation to 45 degrees on the left. She is neurovascularly intact to both lower extremities. She has negative sitting straight leg raises bilaterally.  X-rays/MRI/Lab data:  Recent x-rays of the pelvis and left hip are available for review and have been reviewed by myself. These films demonstrate moderate degenerative changes as manifest by narrowing of the superior clear space as well as moderate osteophyte formation around the femoral neck. No fractures, lytic lesions, or other acute bony abnormalities are identified.  Assessment: Primary osteoarthritis of left hip.   Plan: The treatment options were discussed with the patient. In addition, patient educational materials were provided regarding the diagnosis and treatment options. The patient is quite frustrated by her symptoms and functional limitations. Based on her examination findings, I feel that the majority of her symptoms are indeed coming from her hip rather than from her back. Therefore, I have recommended a surgical procedure, specifically a left total hip arthroplasty. The procedure was discussed with the  patient, as were the potential risks (including bleeding, infection, nerve and/or blood vessel injury, persistent or recurrent pain, loosening and/or failure of the components, dislocation, leg length inequality, need for further surgery, blood clots, strokes, heart attacks and/or arhythmias, pneumonia, etc.) and benefits. The patient states his/her understanding and wishes to proceed. All of the patient's questions and concerns were answered. She can call any time with further concerns. She will follow up post-surgery, routine.   H&P reviewed and patient re-examined. No changes.

## 2021-06-12 NOTE — Progress Notes (Signed)
Patient arrived from PACU in stable condition via stretcher.  Report received from [place], [place].  Husband with patient.    06/12/21 1548  Vitals  Temp 97.9 F (36.6 C)  Temp Source Oral  BP 120/71  MAP (mmHg) 85  BP Location Left Arm  BP Method Automatic  Patient Position (if appropriate) Lying  Pulse Rate 70  Resp 16  MEWS COLOR  MEWS Score Color Green  Oxygen Therapy  SpO2 96 %  O2 Device Room Air

## 2021-06-12 NOTE — Transfer of Care (Signed)
Immediate Anesthesia Transfer of Care Note  Patient: Kathryn Friedman  Procedure(s) Performed: TOTAL HIP ARTHROPLASTY (Left: Hip)  Patient Location: PACU  Anesthesia Type:General  Level of Consciousness: awake, alert  and oriented  Airway & Oxygen Therapy: Patient Spontanous Breathing and Patient connected to face mask oxygen  Post-op Assessment: Report given to RN and Post -op Vital signs reviewed and stable  Post vital signs: Reviewed and stable  Last Vitals:  Vitals Value Taken Time  BP    Temp    Pulse 73 06/12/21 1319  Resp 19 06/12/21 1319  SpO2 98 % 06/12/21 1319  Vitals shown include unvalidated device data.  Last Pain:  Vitals:   06/12/21 0934  TempSrc: Temporal  PainSc: 5          Complications: No notable events documented.

## 2021-06-12 NOTE — Plan of Care (Signed)
?  Problem: Education: ?Goal: Knowledge of the prescribed therapeutic regimen will improve ?Outcome: Progressing ?Goal: Understanding of discharge needs will improve ?Outcome: Progressing ?Goal: Individualized Educational Video(s) ?Outcome: Progressing ?  ?Problem: Activity: ?Goal: Ability to avoid complications of mobility impairment will improve ?Outcome: Progressing ?Goal: Ability to tolerate increased activity will improve ?Outcome: Progressing ?  ?Problem: Clinical Measurements: ?Goal: Postoperative complications will be avoided or minimized ?Outcome: Progressing ?  ?Problem: Pain Management: ?Goal: Pain level will decrease with appropriate interventions ?Outcome: Progressing ?  ?Problem: Skin Integrity: ?Goal: Will show signs of wound healing ?Outcome: Progressing ?  ?

## 2021-06-12 NOTE — Anesthesia Preprocedure Evaluation (Addendum)
Anesthesia Evaluation  Patient identified by MRN, date of birth, ID band Patient awake    Reviewed: Allergy & Precautions, H&P , NPO status , Patient's Chart, lab work & pertinent test results  Airway Mallampati: III       Dental  (+) Missing, Edentulous Upper,    Pulmonary asthma , sleep apnea and Continuous Positive Airway Pressure Ventilation , COPD,  COPD inhaler, Recent URI: pt states she was sick about 2 weeks ago with cough, fever.  Now has residual non-productive cough but no fevers. Energy, appetite are back to normal., Current SmokerPatient did not abstain from smoking.,    Pulmonary exam normal  (-) wheezing (scattered wheeze)      Cardiovascular Exercise Tolerance: Poor hypertension, Pt. on medications + angina (stable) + CAD and + Cardiac Stents   Rhythm:regular Rate:Normal  LHC 05/14/18 results and cardiology plan:  The patient has had progressive canadian class 3 anginal symptoms with a high probability stress test with risk factors including diabetes, high blood pressure, high cholesterol and smoking.  With inferior myocardial perfusion defect  normal left ventricular function with ejection fraction of 55%  severe 1 vessel coronary artery disease   There is significant stenosis of right coronary artery with significant diffuse coronary artery atherosclerosis throughout the entire length of the right coronary artery including proximal and mid disease throughout prior stent Diffuse and significant occlusion of entire distal right coronary artery with collaterals to PDA and PL from left septal  Plan Continue medical management of CAD risk factors, Additional medications for management of angina and No further cardiac intervention at this time Patient will continue on antiplatelet medication management isosorbide beta-blocker calcium channel blocker and high intensity cholesterol therapy She has been instructed to stop  smoking  NM Perfusion Stress Test June 2019: Normal Lexiscan infusion EKG Moderate intensive moderate sized perfusion defect of anterior myocardium consistent with myocardial ischemia Artifact was seen  Pt states angina is unchanged   Neuro/Psych PSYCHIATRIC DISORDERS Depression negative neurological ROS  negative psych ROS   GI/Hepatic Neg liver ROS, GERD  ,  Endo/Other  diabetes, Type 2, Oral Hypoglycemic AgentsMorbid obesity  Renal/GU negative Renal ROS  negative genitourinary   Musculoskeletal  (+) Arthritis ,   Abdominal (+) + obese,   Peds  Hematology  (+) Blood dyscrasia, anemia ,   Anesthesia Other Findings Past Medical History: No date: Anemia No date: Arthritis     Comment:  back and knees No date: Asthma No date: Chronic diarrhea No date: COPD (chronic obstructive pulmonary disease) (HCC) No date: Coronary artery disease     Comment:  s/p stent placement 06/25/06 No date: Depression     Comment:  secondary to the death of her husband (died 38) Diagnosed 06/30/2008: Diabetes mellitus without complication ([place]) No date: Diverticulitis No date: Fatty infiltration of liver No date: GERD (gastroesophageal reflux disease) No date: Hypertension No date: Hypertriglyceridemia No date: Sleep apnea     Comment:  on CPAP No date: Spastic colon No date: Tobacco abuse  Past Surgical History: with left ovary in place 1996: [place]: APPENDECTOMY 10/14/2017: BREAST BIOPSY; Left     Comment:  benign 2007: CARDIAC CATHETERIZATION     Comment:  stents 1984: [place]: CHOLECYSTECTOMY 09/13/2016: COLONOSCOPY WITH PROPOFOL; N/A     Comment:  Procedure: COLONOSCOPY WITH PROPOFOL;  Surgeon: Manya Silvas, MD;  Location: [facility] ENDOSCOPY;  Service:  Endoscopy;  Laterality: N/A; 02/02/2018: ESOPHAGOGASTRODUODENOSCOPY (EGD) WITH PROPOFOL; N/A     Comment:  Procedure: ESOPHAGOGASTRODUODENOSCOPY (EGD) WITH                PROPOFOL;  Surgeon: Manya Silvas, MD;  Location:               [facility] ENDOSCOPY;  Service: Endoscopy;  Laterality: N/A; No date: JOINT REPLACEMENT     Comment:  bilateral knee replacements Arthroscopic left knee surgery : KNEE ARTHROSCOPY status post knee surgey : KNEE SURGERY 05/14/2018: LEFT HEART CATH AND CORONARY ANGIOGRAPHY; Left     Comment:  Procedure: LEFT HEART CATH AND CORONARY ANGIOGRAPHY;                Surgeon: Corey Skains, MD;  Location: [place]               CV LAB;  Service: Cardiovascular;  Laterality: Left; ([facility]): REPLACEMENT TOTAL KNEE shoulder operation secondary to a torn tendon: SHOULDER SURGERY  BMI    Body Mass Index:  39.48 kg/m      Reproductive/Obstetrics negative OB ROS                            Anesthesia Physical  Anesthesia Plan  ASA: III  Anesthesia Plan: General ETT   Post-op Pain Management:    Induction: Intravenous  PONV Risk Score and Plan: 2 and Ondansetron and Dexamethasone  Airway Management Planned: Oral ETT  Additional Equipment:   Intra-op Plan:   Post-operative Plan:   Informed Consent: I have reviewed the patients History and Physical, chart, labs and discussed the procedure including the risks, benefits and alternatives for the proposed anesthesia with the patient or authorized representative who has indicated his/her understanding and acceptance.     Dental Advisory Given  Plan Discussed with: Anesthesiologist, CRNA and Surgeon  Anesthesia Plan Comments:       Anesthesia Quick Evaluation

## 2021-06-12 NOTE — Anesthesia Postprocedure Evaluation (Signed)
Anesthesia Post Note  Patient: Kathryn Friedman  Procedure(s) Performed: TOTAL HIP ARTHROPLASTY (Left: Hip)  Patient location during evaluation: PACU Anesthesia Type: General Level of consciousness: awake and alert Pain management: pain level controlled Vital Signs Assessment: post-procedure vital signs reviewed and stable Respiratory status: spontaneous breathing, nonlabored ventilation, respiratory function stable and patient connected to nasal cannula oxygen Cardiovascular status: blood pressure returned to baseline and stable Postop Assessment: no apparent nausea or vomiting Anesthetic complications: no   No notable events documented.   Last Vitals:  Vitals:   06/12/21 1530 06/12/21 1548  BP:  120/71  Pulse:  70  Resp:  16  Temp: 36.4 C 36.6 C  SpO2:  96%    Last Pain:  Vitals:   06/12/21 1548  TempSrc: Oral  PainSc:                  [place] Ouch

## 2021-06-13 ENCOUNTER — Encounter: Payer: Self-pay | Admitting: Surgery

## 2021-06-13 LAB — CBC
HCT: 43.4 % (ref 36.0–46.0)
Hemoglobin: 14.5 g/dL (ref 12.0–15.0)
MCH: 32.1 pg (ref 26.0–34.0)
MCHC: 33.4 g/dL (ref 30.0–36.0)
MCV: 96 fL (ref 80.0–100.0)
Platelets: 169 10*3/uL (ref 150–400)
RBC: 4.52 MIL/uL (ref 3.87–5.11)
RDW: 14 % (ref 11.5–15.5)
WBC: 14.2 10*3/uL — ABNORMAL HIGH (ref 4.0–10.5)
nRBC: 0 % (ref 0.0–0.2)

## 2021-06-13 LAB — BASIC METABOLIC PANEL
Anion gap: 10 (ref 5–15)
BUN: 6 mg/dL — ABNORMAL LOW (ref 8–23)
CO2: 26 mmol/L (ref 22–32)
Calcium: 8.6 mg/dL — ABNORMAL LOW (ref 8.9–10.3)
Chloride: 103 mmol/L (ref 98–111)
Creatinine, Ser: 0.64 mg/dL (ref 0.44–1.00)
GFR, Estimated: >60 mL/min (ref >60)
Glucose, Bld: 127 mg/dL — ABNORMAL HIGH (ref 70–99)
Potassium: 4.3 mmol/L (ref 3.5–5.1)
Sodium: 139 mmol/L (ref 135–145)

## 2021-06-13 LAB — GLUCOSE, CAPILLARY
Glucose-Capillary: 124 mg/dL — ABNORMAL HIGH (ref 70–99)
Glucose-Capillary: 142 mg/dL — ABNORMAL HIGH (ref 70–99)

## 2021-06-13 MED ORDER — TRAMADOL HCL 50 MG PO TABS: 50.0000 mg | ORAL_TABLET | Freq: Four times a day (QID) | ORAL | 0 refills | Status: DC | PRN

## 2021-06-13 MED ORDER — OXYCODONE HCL 5 MG PO TABS: 5.0000 mg | ORAL_TABLET | ORAL | 0 refills | Status: DC | PRN

## 2021-06-13 MED ORDER — ONDANSETRON HCL 4 MG PO TABS: 4.0000 mg | ORAL_TABLET | Freq: Four times a day (QID) | ORAL | 0 refills | Status: DC | PRN

## 2021-06-13 MED ORDER — APIXABAN 2.5 MG PO TABS: 2.5000 mg | ORAL_TABLET | Freq: Two times a day (BID) | ORAL | 0 refills | Status: DC

## 2021-06-13 NOTE — Progress Notes (Signed)
Subjective: 1 Day Post-Op Procedure(s) (LRB): TOTAL HIP ARTHROPLASTY (Left) Patient reports pain as mild.   Patient is well, and has had no acute complaints or problems Plan is to go Home after hospital stay. Negative for chest pain and shortness of breath Fever: no Gastrointestinal:Negative for nausea and vomiting Patient reports that she is passing gas.  No BM since surgery at this time.  Objective: Vital signs in last 24 hours: Temp:  [96.7 F (35.9 C)-98.4 F (36.9 C)] 97.8 F (36.6 C) (07/20 0528) Pulse Rate:  [66-77] 77 (07/20 0528) Resp:  [16-28] 17 (07/20 0528) BP: (114-154)/(56-80) 122/80 (07/20 0528) SpO2:  [91 %-98 %] 95 % (07/20 0528) Weight:  [96.6 kg] 96.6 kg (07/19 0934)  Intake/Output from previous day:  Intake/Output Summary (Last 24 hours) at 06/13/2021 0712 Last data filed at 06/13/2021 0600 Gross per 24 hour  Intake 2387.17 ml  Output 800 ml  Net 1587.17 ml    Intake/Output this shift: No intake/output data recorded.  Labs: No results for input(s): HGB in the last 72 hours. No results for input(s): WBC, RBC, HCT, PLT in the last 72 hours. No results for input(s): NA, K, CL, CO2, BUN, CREATININE, GLUCOSE, CALCIUM in the last 72 hours. No results for input(s): LABPT, INR in the last 72 hours.   EXAM General - Patient is Alert, Appropriate, and Oriented Extremity - ABD soft Sensation intact distally Dorsiflexion/Plantar flexion intact Incision: dressing C/D/I No cellulitis present Dressing/Incision - clean, dry, no drainage, honeycomb intact to the left hip. Motor Function - intact, moving foot and toes well on exam.  Abdomen soft with norma bowel sounds this AM.  Past Medical History:  Diagnosis Date   Anemia    Anginal pain (HCC)    Anxiety    Arthritis    back and knees   Asthma    Bilateral carotid artery stenosis    Blood in stool    Chronic diarrhea    COPD (chronic obstructive pulmonary disease) (HCC)    Coronary artery disease     a.) 75% pRCA; 3.5 x 28 mm Cypher DES placed on 07/24/2006   Current use of long term anticoagulation    Clopidogrel   Depression    secondary to the death of her husband (died 57)   Diverticulitis    Diverticulosis    Dizzinesses    Dysphagia    Dyspnea    Fatty infiltration of liver    GERD (gastroesophageal reflux disease)    Headache    History of 2019 novel coronavirus disease (COVID-19) 12/09/2020   History of 2019 novel coronavirus disease (COVID-19) 12/20/2020   Hypertension    Hypertriglyceridemia    ILD (interstitial lung disease) ([place])    Lump in the abdomen    OSA on CPAP    Overactive bladder    PSVT (paroxysmal supraventricular tachycardia) (HCC)    Spastic colon    T2DM (type 2 diabetes mellitus) ([place]) 05/2008   Tobacco abuse    Venous insufficiency of both lower extremities     Assessment/Plan: 1 Day Post-Op Procedure(s) (LRB): TOTAL HIP ARTHROPLASTY (Left) Active Problems:   Status post total hip replacement, left  Estimated body mass index is 36.56 kg/m as calculated from the following:   Height as of this encounter: 5\' 4"  (1.626 m).   Weight as of this encounter: 96.6 kg. Advance diet Up with therapy D/C IV fluids when tolerating po intake.  Vitals reviewed, WBC 14.2, Hg 14.5.   Patient walked 18 feet  with PT yesterday.  Up with PT today. Patient is passing gas, work on BM. Plan for possible d/c home today pending progress with PT>  DVT Prophylaxis - Foot Pumps, TED hose, and Eliquis Weight-Bearing as tolerated to left leg  J. Cameron Proud, PA-C [facility] 06/13/2021, 7:12 AM

## 2021-06-13 NOTE — Progress Notes (Signed)
Met with the patient to discuss DC plan and needs She has transportation Can afford her medicaiton She is set up with [place] for [facility] PT She has a rollator and will need a RW, she has a 3 in 1 and shower seat, She needs a rolling walker Will cotninue to monitor for needs

## 2021-06-13 NOTE — Discharge Summary (Signed)
Physician Discharge Summary  Patient ID: Kathryn Friedman MRN: [id number] DOB/AGE: 1956-08-11 65 y.o.  Admit date: 06/12/2021 Discharge date: 06/13/2021  Admission Diagnoses:  Status post total hip replacement, left [Z96.642]  Discharge Diagnoses: Patient Active Problem List   Diagnosis Date Noted   Status post total hip replacement, left 06/12/2021   Moderate episode of recurrent major depressive disorder ([place]) 04/05/2021   Aortic atherosclerosis ([place]) 04/03/2021   SOBOE (shortness of breath on exertion) 01/16/2021   Thrush 12/27/2020   Suspected COVID-19 virus infection 12/20/2020   MDD (major depressive disorder), recurrent episode, mild ([place]) 11/16/2020   Abdominal wall abscess 07/30/2020   Skin lesion 07/23/2020   Boil 07/20/2020   MDD (major depressive disorder), recurrent, in full remission ([place]) 06/26/2020   Insomnia due to medical condition 06/26/2020   Anxiety disorder 06/26/2020   Cough 04/03/2020   Urge incontinence 03/02/2020   Urinary frequency 02/06/2020   Bilateral carotid artery stenosis 10/15/2019   PSVT (paroxysmal supraventricular tachycardia) ([place]) 08/09/2019   History of migraine headaches 05/10/2019   Lower abdominal pain 03/21/2019   Dysphagia 09/15/2018   ILD (interstitial lung disease) ([place]) 08/17/2017   Tobacco use disorder 08/11/2016   Venous insufficiency of both lower extremities 07/10/2016   Healthcare maintenance 06/18/2016   Lump in the abdomen 08/06/2015   Dizziness 08/06/2015   Fatty infiltration of liver 08/11/2014   Blood in the stool 08/11/2014   Acute diarrhea 08/11/2014   Hemorrhage of gastrointestinal tract 08/07/2014   GERD (gastroesophageal reflux disease) 10/20/2013   Iron deficiency anemia 10/20/2013   Nausea with vomiting 10/19/2013   LUQ pain 10/19/2013   Diarrhea 09/26/2013   Anemia 04/12/2013   CAD (coronary artery disease) 02/18/2013   COPD (chronic obstructive pulmonary disease) ([place]) 02/18/2013    Obstructive sleep apnea 02/18/2013   Mild depression ([place]) 02/18/2013   Diverticulosis 02/18/2013   Diabetes ([place]) 02/17/2013   Essential hypertension, benign 02/17/2013   Hypercholesterolemia 02/17/2013    Past Medical History:  Diagnosis Date   Anemia    Anginal pain (HCC)    Anxiety    Arthritis    back and knees   Asthma    Bilateral carotid artery stenosis    Blood in stool    Chronic diarrhea    COPD (chronic obstructive pulmonary disease) (HCC)    Coronary artery disease    a.) 75% pRCA; 3.5 x 28 mm Cypher DES placed on 07/24/2006   Current use of long term anticoagulation    Clopidogrel   Depression    secondary to the death of her husband (died 36)   Diverticulitis    Diverticulosis    Dizzinesses    Dysphagia    Dyspnea    Fatty infiltration of liver    GERD (gastroesophageal reflux disease)    Headache    History of 2019 novel coronavirus disease (COVID-19) 12/09/2020   History of 2019 novel coronavirus disease (COVID-19) 12/20/2020   Hypertension    Hypertriglyceridemia    ILD (interstitial lung disease) ([place])    Lump in the abdomen    OSA on CPAP    Overactive bladder    PSVT (paroxysmal supraventricular tachycardia) ([place])    Spastic colon    T2DM (type 2 diabetes mellitus) ([place]) 05/2008   Tobacco abuse    Venous insufficiency of both lower extremities    Transfusion: None.   Consultants (if any):   Discharged Condition: Improved  Hospital Course: [name] is an 65 y.o. female who was admitted 06/12/2021  with a diagnosis of degenerative joint disease of the left hip and went to the operating room on 06/12/2021 and underwent the above named procedures.    Surgeries: Procedure(s): TOTAL HIP ARTHROPLASTY on 06/12/2021 Patient tolerated the surgery well. Taken to PACU where she was stabilized and then transferred to the orthopedic floor.  Started on Eliquis 2.54m twice daily and her Plavix was held. Foot pumps applied bilaterally at  80 mm. Heels elevated on bed with rolled towels. No evidence of DVT. Negative [place]. Physical therapy started on day #1 for gait training and transfer. OT started day #1 for ADL and assisted devices.  Patient's IV was removed on POD1.  Implants: Biomet press-fit system with a #11 offset Echo femoral stem, a 48 mm acetabular shell with an E-poly hi-wall liner, and a 32 mm ceramic head with a +0 mm neck.  She was given perioperative antibiotics:  Anti-infectives (From admission, onward)    Start     Dose/Rate Route Frequency Ordered Stop   06/12/21 2100  ceFAZolin (ANCEF) IVPB 2g/100 mL premix        2 g 200 mL/hr over 30 Minutes Intravenous Every 6 hours 06/12/21 1954 06/13/21 0414   06/12/21 1700  ceFAZolin (ANCEF) IVPB 2g/100 mL premix  Status:  Discontinued        2 g 200 mL/hr over 30 Minutes Intravenous Every 6 hours 06/12/21 1558 06/12/21 1954   06/12/21 0953  ceFAZolin (ANCEF) 2-4 GM/100ML-% IVPB       Note to Pharmacy: FOlena Mater  : cabinet override      06/12/21 0953 06/12/21 1111   06/12/21 0600  ceFAZolin (ANCEF) IVPB 2g/100 mL premix        2 g 200 mL/hr over 30 Minutes Intravenous On call to O.R. 06/11/21 2236 06/12/21 1105     .  She was given sequential compression devices, early ambulation, and Eliquis for DVT prophylaxis.  She benefited maximally from the hospital stay and there were no complications.    Recent vital signs:  Vitals:   06/13/21 0528 06/13/21 0759  BP: 122/80 (!) 146/93  Pulse: 77 74  Resp: 17 17  Temp: 97.8 F (36.6 C) 98.2 F (36.8 C)  SpO2: 95% 93%   Recent laboratory studies:  Lab Results  Component Value Date   HGB 14.5 06/13/2021   HGB 14.6 06/06/2021   HGB 15.7 (H) 05/23/2021   Lab Results  Component Value Date   WBC 14.2 (H) 06/13/2021   PLT 169 06/13/2021   No results found for: INR Lab Results  Component Value Date   NA 139 06/13/2021   K 4.3 06/13/2021   CL 103 06/13/2021   CO2 26 06/13/2021   BUN 6 (L)  06/13/2021   CREATININE 0.64 06/13/2021   GLUCOSE 127 (H) 06/13/2021    Discharge Medications:   Allergies as of 06/13/2021       Reactions   Varenicline Other (See Comments)   "I got really depressed" "I got really depressed" CHANTEX   Jardiance [empagliflozin] Other (See Comments)   Yeast infection   Methylprednisolone Nausea Only, Nausea And Vomiting        Medication List     STOP taking these medications    clopidogrel 75 MG tablet Commonly known as: PLAVIX   hydrOXYzine 25 MG capsule Commonly known as: VISTARIL       TAKE these medications    acetaminophen 325 MG tablet Commonly known as: TYLENOL Take 650 mg by mouth every 6 (  six) hours as needed.   amLODipine 5 MG tablet Commonly known as: NORVASC Take 1 tablet (5 mg total) by mouth daily.   apixaban 2.5 MG Tabs tablet Commonly known as: ELIQUIS Take 1 tablet (2.5 mg total) by mouth 2 (two) times daily.   aspirin 81 MG tablet Take 81 mg by mouth daily.   Breztri Aerosphere 160-9-4.8 MCG/ACT Aero Generic drug: Budeson-Glycopyrrol-Formoterol Inhale 2 puffs into the lungs in the morning and at bedtime.   CALCIUM PO Take 600 mg by mouth daily.   diclofenac 75 MG EC tablet Commonly known as: VOLTAREN Take 75 mg by mouth 2 (two) times daily.   dicyclomine 20 MG tablet Commonly known as: BENTYL Take 20 mg by mouth every 6 (six) hours as needed.   DULoxetine 60 MG capsule Commonly known as: CYMBALTA Take 1 capsule (60 mg total) by mouth daily.   gabapentin 300 MG capsule Commonly known as: Neurontin Take 1 capsule (300 mg total) by mouth at bedtime.   glucose blood test strip Use as instructed to check blood sugars twice daily. Dx E11.9.   L-FORMULA LYSINE HCL PO Take 1,000 mg by mouth daily.   metFORMIN 500 MG 24 hr tablet Commonly known as: GLUCOPHAGE-XR Take 2 tablets (1,000 mg total) by mouth in the morning and at bedtime.   MULTIVITAMIN PO Take 1 tablet by mouth daily.    ondansetron 4 MG tablet Commonly known as: ZOFRAN Take 1 tablet (4 mg total) by mouth every 6 (six) hours as needed for nausea.   oxybutynin 10 MG 24 hr tablet Commonly known as: DITROPAN-XL Take 1 tablet (10 mg total) by mouth daily.   oxyCODONE 5 MG immediate release tablet Commonly known as: Oxy IR/ROXICODONE Take 1-2 tablets (5-10 mg total) by mouth every 4 (four) hours as needed for moderate pain (pain score 4-6).   oxymetazoline 0.05 % nasal spray Commonly known as: AFRIN Place 1 spray into both nostrils 2 (two) times daily.   Pfizer-BioNT COVID-19 Vac-TriS Susp injection Generic drug: COVID-19 mRNA Vac-TriS (Pfizer)   Potassium 99 MG Tabs Take 99 mg by mouth daily.   rosuvastatin 20 MG tablet Commonly known as: Crestor Take 1 tablet (20 mg total) by mouth daily.   telmisartan 80 MG tablet Commonly known as: Micardis Take 1 tablet (80 mg total) by mouth daily.   traMADol 50 MG tablet Commonly known as: ULTRAM Take 1 tablet (50 mg total) by mouth every 6 (six) hours as needed for moderate pain. What changed: when to take this   traZODone 100 MG tablet Commonly known as: DESYREL Take 2 tablets (200 mg total) by mouth at bedtime.   True Metrix Meter w/Device Kit       ASK your doctor about these medications    albuterol 108 (90 Base) MCG/ACT inhaler Commonly known as: Ventolin HFA INHALE 2 PUFFS FOUR TIMES A DAY   cyclobenzaprine 10 MG tablet Commonly known as: FLEXERIL TAKE 1 TABLET THREE TIMES DAILY AS NEEDED FOR MUSCLE SPASM(S)   famotidine 40 MG tablet Commonly known as: PEPCID TAKE 1 TABLET (40 MG TOTAL) BY MOUTH DAILY.   isosorbide mononitrate 30 MG 24 hr tablet Commonly known as: IMDUR TAKE 1 TABLET EVERY DAY   lamoTRIgine 25 MG tablet Commonly known as: LAMICTAL TAKE 1 TABLET EVERY DAY FOR MOOD   mupirocin ointment 2 % Commonly known as: Bactroban Apply to affected area on abdomen bid   nystatin powder Commonly known as:  MYCOSTATIN/NYSTOP APPLY TO THE AFFECTED AREA(S) TWICE  DAILY   pantoprazole 40 MG tablet Commonly known as: PROTONIX TAKE 1 TABLET TWICE DAILY BEFORE MEALS   propranolol 40 MG tablet Commonly known as: INDERAL 1 tab daily PO               Durable Medical Equipment  (From admission, onward)           Start     Ordered   06/12/21 1559  DME Bedside commode  Once       Question:  Patient needs a bedside commode to treat with the following condition  Answer:  Status post total hip replacement, left   06/12/21 1558   06/12/21 1559  DME 3 n 1  Once        06/12/21 1558   06/12/21 1559  DME Walker rolling  Once       Question Answer Comment  Walker: With 5 Inch Wheels   Patient needs a walker to treat with the following condition Status post total hip replacement, left      06/12/21 1558           Diagnostic Studies: DG Pelvis Portable  Result Date: 06/12/2021 CLINICAL DATA:  Postop hip replacement. EXAM: PORTABLE PELVIS 1-2 VIEWS COMPARISON:  None. FINDINGS: Left hip arthroplasty in expected alignment. No periprosthetic lucency. Chronic changes are noted about the left greater trochanter with enthesopathy and cystic change. Recent postsurgical change includes air and edema in the soft tissues and lateral skin staples. IMPRESSION: Left hip arthroplasty without immediate postoperative complication. Electronically Signed   By: Keith Rake M.D.   On: 06/12/2021 14:48    Disposition: Plan for discharge home today pending progress with PT this afternoon.   Follow-up Information     Lattie Corns, PA-C Follow up in 14 day(s).   Specialty: Physician Assistant Why: Staple Removal and x-rays of the left hip. Contact information: [place] [address] [phone]                Signed: Judson Roch PA-C 06/13/2021, 11:38 AM

## 2021-06-13 NOTE — Progress Notes (Signed)
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    PT Comments    Pt did well with ambulation and stair negotiation.  Of note, she did have an episode of lightheadedness after ~160 ft and needed to sit for a few minutes, vitals were stable/appropriate and she was easily able to complete the loop safely and confidently.  Despite consistent reminders she struggles to verbally recall precautions, though she is able to maintain them appropriately with mobility, etc.  Pt eager to go home and will have 24/7 assist, appropriate to do so from PT stand-point.   Follow Up Recommendations  Home health PT     Equipment Recommendations  Rolling walker with 5" wheels    Recommendations for Other Services       Precautions / Restrictions Precautions Precautions: Posterior Hip;Fall Precaution Comments: Pt good about maintaining precautions during functional tasks, but still needing some cuing to consistently recall Restrictions Weight Bearing Restrictions: Yes LLE Weight Bearing: Weight bearing as tolerated    Mobility  Bed Mobility Overal bed mobility: Modified Independent             General bed mobility comments: Pt was able  to maintain hip precautions and get herself to EOB w/o direct assist, heavy UE use on rails    Transfers Overall transfer level: Modified independent Equipment used: Rolling walker (2 wheeled) Transfers: Sit to/from Stand Sit to Stand: Supervision         General transfer comment: Again pt able to rise sit to stand multiple times t/o session, no direct assist needed each time  Ambulation/Gait Ambulation/Gait assistance: Supervision Gait Distance (Feet): 200 Feet Assistive device: Rolling walker (2 wheeled)       General Gait Details: Pt able to maintain good cadence with  ambulation essentially from the first step.  She did need to stop and rest at ~160 ft secondary to some lightheadedness (check BP which was WNL).  After ~2 minutes she was able to stand up and complete the loop w/o issue   Stairs Stairs: Yes Stairs assistance: Min guard Stair Management: Two rails Number of Stairs: 4 General stair comments: Pt was able to easily ascend and descend the steps, remebered appropriate strategy and did not need assist or excessive cuing   Wheelchair Mobility    Modified Rankin (Stroke Patients Only)       Balance Overall balance assessment: Modified Independent   Sitting balance-Leahy Scale: Good       Standing balance-Leahy Scale: Good                              Cognition Arousal/Alertness: Awake/alert Behavior During Therapy: WFL for tasks assessed/performed Overall Cognitive Status: Within Functional Limits for tasks assessed                                        Exercises      General Comments        Pertinent Vitals/Pain Pain Score: 6     Home Living                      Prior Function  PT Goals (current goals can now be found in the care plan section) Progress towards PT goals: Progressing toward goals    Frequency    BID      PT Plan Current plan remains appropriate    Co-evaluation              AM-PAC PT "6 Clicks" Mobility   Outcome Measure  Help needed turning from your back to your side while in a flat bed without using bedrails?: None Help needed moving from lying on your back to sitting on the side of a flat bed without using bedrails?: None Help needed moving to and from a bed to a chair (including a wheelchair)?: None Help needed standing up from a chair using your arms (e.g., wheelchair or bedside chair)?: None Help needed to walk in hospital room?: A Little Help needed climbing 3-5 steps with a railing? : A Little 6 Click Score: 22    End  of Session Equipment Utilized During Treatment: Gait belt Activity Tolerance: Patient limited by fatigue;Patient tolerated treatment well Patient left: with chair alarm set;with call bell/phone within reach;with nursing/sitter in room Nurse Communication: Mobility status PT Visit Diagnosis: Muscle weakness (generalized) (M62.81);Difficulty in walking, not elsewhere classified (R26.2);Pain Pain - Right/Left: Left Pain - part of body: Hip     Time: 1455-1520 PT Time Calculation (min) (ACUTE ONLY): 25 min  Charges:  $Gait Training: 23-37 mins                     Kreg Shropshire, DPT 06/13/2021, 5:10 PM

## 2021-06-13 NOTE — Discharge Instructions (Signed)
Instructions after Total Hip Replacement     J. Dorien Chihuahua, M.D.  Raquel Masaye Gatchalian, PA-C     Dept. of [facility]  [address], [place]  [address]  Phone: [phone]   Fax: [phone]    DIET: Drink plenty of non-alcoholic fluids. Resume your normal diet. Include foods high in fiber.  ACTIVITY:  You may use crutches or a walker with weight-bearing as tolerated, unless instructed otherwise. You may be weaned off of the walker or crutches by your Physical Therapist.  Do NOT reach below the level of your knees or cross your legs until allowed.    Continue doing gentle exercises. Exercising will reduce the pain and swelling, increase motion, and prevent muscle weakness.   Please continue to use the TED compression stockings for 6 weeks. You may remove the stockings at night, but should reapply them in the morning. Do not drive or operate any equipment until instructed.  WOUND CARE:  Continue to use ice packs periodically to reduce pain and swelling. Keep the incision clean and dry. You may bathe or shower after the staples are removed at the first office visit following surgery.  MEDICATIONS: You may resume your regular medications. Please take the pain medication as prescribed on the medication. Do not take pain medication on an empty stomach. You have been given a prescription for a blood thinner to prevent blood clots. Please take the medication as instructed.  Take the Eliquis for two weeks, after two weeks transition back to your routine Plavix. Pain medications and iron supplements can cause constipation. Use a stool softener (Senokot or Colace) on a daily basis and a laxative (dulcolax or miralax) as needed. Do not drive or drink alcoholic beverages when taking pain medications.  CALL THE OFFICE FOR: Temperature above 101 degrees Excessive bleeding or drainage on the dressing. Excessive swelling, coldness, or paleness  of the toes. Persistent nausea and vomiting.  FOLLOW-UP:  You should have an appointment to return to the office in 2 weeks after surgery. Arrangements have been made for continuation of Physical Therapy (either home therapy or outpatient therapy).

## 2021-06-13 NOTE — Plan of Care (Signed)
  Problem: Education: Goal: Knowledge of the prescribed therapeutic regimen will improve Outcome: Completed/Met Goal: Understanding of discharge needs will improve Outcome: Completed/Met Goal: Individualized Educational Video(s) Outcome: Completed/Met   Problem: Activity: Goal: Ability to avoid complications of mobility impairment will improve Outcome: Completed/Met Goal: Ability to tolerate increased activity will improve Outcome: Completed/Met   Problem: Clinical Measurements: Goal: Postoperative complications will be avoided or minimized Outcome: Completed/Met   Problem: Pain Management: Goal: Pain level will decrease with appropriate interventions Outcome: Completed/Met   Problem: Skin Integrity: Goal: Will show signs of wound healing Outcome: Completed/Met   

## 2021-06-13 NOTE — Progress Notes (Signed)
Physical Therapy Treatment Patient Details Name: TYHESHA DUTSON MRN: [id number] DOB: [birth year] Today's Date: 06/13/2021    History of Present Illness 65 y/o female s/p L total hip replacement 7/19, lateral approach.    PT Comments    Pt with less pain, more confidence today and ultimately did quite well with PT. She was able to get to sitting, standing, on/off commode, etc w/o direct assist.  She initially had some hesitancy with ambulation but was able to quickly assume consistent walker momentum and confident cadence with circumambulation of the nurses' station and overall she is feel well about her progress.  We plan to do stairs this afternoon and if successful pt hopes to be able to d/c today.   Follow Up Recommendations  Home health PT     Equipment Recommendations  Rolling walker with 5" wheels (pt may only have a 4WW at home, will need FWW)    Recommendations for Other Services       Precautions / Restrictions Precautions Precautions: Posterior Hip;Fall Restrictions LLE Weight Bearing: Weight bearing as tolerated    Mobility  Bed Mobility Overal bed mobility: Modified Independent             General bed mobility comments: Pt was able  to maintain hip precautions and get herself to EOB w/o direct assist, heavy UE use on rails    Transfers Overall transfer level: Modified independent Equipment used: Rolling walker (2 wheeled) Transfers: Sit to/from Stand Sit to Stand: Supervision         General transfer comment: Pt able to rise from multiple height surfaces w/o direct assist  Ambulation/Gait Ambulation/Gait assistance: Supervision Gait Distance (Feet): 200 Feet Assistive device: Rolling walker (2 wheeled)       General Gait Details: Pt with improved speed, cadence and confidence with ambulation this date.  She initially had some stop-go hesitancy with heavy UE reliance but was ultimately able to improve speed and cadence nicely and fully  circumambulated the nurses' station w/o assist and needing only light cuing   Stairs             Wheelchair Mobility    Modified Rankin (Stroke Patients Only)       Balance Overall balance assessment: Needs assistance Sitting-balance support: No upper extremity supported Sitting balance-Leahy Scale: Good     Standing balance support: Bilateral upper extremity supported Standing balance-Leahy Scale: Good Standing balance comment: no LOBs or bucking during prolonged standing/functional tasks, able to stand at sink w/o UE support to wash hands                            Cognition Arousal/Alertness: Awake/alert Behavior During Therapy: WFL for tasks assessed/performed Overall Cognitive Status: Within Functional Limits for tasks assessed                                        Exercises Total Joint Exercises Quad Sets: Strengthening;10 reps Short Arc Quad: Strengthening;10 reps Heel Slides: Strengthening;10 reps (with resisted leg ext) Hip ABduction/ADduction: Strengthening;10 reps Straight Leg Raises: AROM;5 reps    General Comments        Pertinent Vitals/Pain Pain Score: 4     Home Living                      Prior Function  PT Goals (current goals can now be found in the care plan section) Progress towards PT goals: Progressing toward goals    Frequency    BID      PT Plan Current plan remains appropriate    Co-evaluation              AM-PAC PT "6 Clicks" Mobility   Outcome Measure  Help needed turning from your back to your side while in a flat bed without using bedrails?: None Help needed moving from lying on your back to sitting on the side of a flat bed without using bedrails?: None Help needed moving to and from a bed to a chair (including a wheelchair)?: None Help needed standing up from a chair using your arms (e.g., wheelchair or bedside chair)?: None Help needed to walk in  hospital room?: A Little Help needed climbing 3-5 steps with a railing? : A Little 6 Click Score: 22    End of Session Equipment Utilized During Treatment: Gait belt;Oxygen Activity Tolerance: Patient limited by fatigue;Patient tolerated treatment well Patient left: with chair alarm set;with call bell/phone within reach;with nursing/sitter in room Nurse Communication: Mobility status PT Visit Diagnosis: Muscle weakness (generalized) (M62.81);Difficulty in walking, not elsewhere classified (R26.2);Pain Pain - Right/Left: Left Pain - part of body: Hip     Time: 1000-1040 PT Time Calculation (min) (ACUTE ONLY): 40 min  Charges:  $Gait Training: 8-22 mins $Therapeutic Exercise: 8-22 mins $Therapeutic Activity: 8-22 mins                     Kreg Shropshire, DPT 06/13/2021, 1:30 PM

## 2021-06-13 NOTE — Progress Notes (Signed)
  Chaplain On-Call responded to Order Requisition for Advance Directives information for the patient.  Provided the documents and education to the patient.  The patient stated that she will read and discuss further with her family later today.  Chaplain advised patient that the Nurse can contact the Chaplain as needed if patient desires to complete the AD documents.  Chaplain Pollyann Samples M.Div., [facility]

## 2021-06-14 DIAGNOSIS — Z471 Aftercare following joint replacement surgery: Secondary | ICD-10-CM | POA: Diagnosis not present

## 2021-06-14 DIAGNOSIS — E785 Hyperlipidemia, unspecified: Secondary | ICD-10-CM | POA: Diagnosis not present

## 2021-06-14 DIAGNOSIS — J449 Chronic obstructive pulmonary disease, unspecified: Secondary | ICD-10-CM | POA: Diagnosis not present

## 2021-06-14 DIAGNOSIS — I872 Venous insufficiency (chronic) (peripheral): Secondary | ICD-10-CM | POA: Diagnosis not present

## 2021-06-14 DIAGNOSIS — Z96642 Presence of left artificial hip joint: Secondary | ICD-10-CM | POA: Diagnosis not present

## 2021-06-14 DIAGNOSIS — I10 Essential (primary) hypertension: Secondary | ICD-10-CM | POA: Diagnosis not present

## 2021-06-14 DIAGNOSIS — E1151 Type 2 diabetes mellitus with diabetic peripheral angiopathy without gangrene: Secondary | ICD-10-CM | POA: Diagnosis not present

## 2021-06-14 DIAGNOSIS — I2510 Atherosclerotic heart disease of native coronary artery without angina pectoris: Secondary | ICD-10-CM | POA: Diagnosis not present

## 2021-06-14 DIAGNOSIS — D649 Anemia, unspecified: Secondary | ICD-10-CM | POA: Diagnosis not present

## 2021-06-14 LAB — SURGICAL PATHOLOGY

## 2021-06-15 DIAGNOSIS — Z471 Aftercare following joint replacement surgery: Secondary | ICD-10-CM | POA: Diagnosis not present

## 2021-06-16 DIAGNOSIS — E1151 Type 2 diabetes mellitus with diabetic peripheral angiopathy without gangrene: Secondary | ICD-10-CM | POA: Diagnosis not present

## 2021-06-16 DIAGNOSIS — I872 Venous insufficiency (chronic) (peripheral): Secondary | ICD-10-CM | POA: Diagnosis not present

## 2021-06-16 DIAGNOSIS — I2510 Atherosclerotic heart disease of native coronary artery without angina pectoris: Secondary | ICD-10-CM | POA: Diagnosis not present

## 2021-06-16 DIAGNOSIS — E785 Hyperlipidemia, unspecified: Secondary | ICD-10-CM | POA: Diagnosis not present

## 2021-06-16 DIAGNOSIS — Z471 Aftercare following joint replacement surgery: Secondary | ICD-10-CM | POA: Diagnosis not present

## 2021-06-16 DIAGNOSIS — Z96642 Presence of left artificial hip joint: Secondary | ICD-10-CM | POA: Diagnosis not present

## 2021-06-16 DIAGNOSIS — I10 Essential (primary) hypertension: Secondary | ICD-10-CM | POA: Diagnosis not present

## 2021-06-16 DIAGNOSIS — J449 Chronic obstructive pulmonary disease, unspecified: Secondary | ICD-10-CM | POA: Diagnosis not present

## 2021-06-16 DIAGNOSIS — D649 Anemia, unspecified: Secondary | ICD-10-CM | POA: Diagnosis not present

## 2021-06-18 DIAGNOSIS — J449 Chronic obstructive pulmonary disease, unspecified: Secondary | ICD-10-CM | POA: Diagnosis not present

## 2021-06-18 DIAGNOSIS — I10 Essential (primary) hypertension: Secondary | ICD-10-CM | POA: Diagnosis not present

## 2021-06-18 DIAGNOSIS — Z96642 Presence of left artificial hip joint: Secondary | ICD-10-CM | POA: Diagnosis not present

## 2021-06-18 DIAGNOSIS — E1151 Type 2 diabetes mellitus with diabetic peripheral angiopathy without gangrene: Secondary | ICD-10-CM | POA: Diagnosis not present

## 2021-06-18 DIAGNOSIS — I2510 Atherosclerotic heart disease of native coronary artery without angina pectoris: Secondary | ICD-10-CM | POA: Diagnosis not present

## 2021-06-18 DIAGNOSIS — Z471 Aftercare following joint replacement surgery: Secondary | ICD-10-CM | POA: Diagnosis not present

## 2021-06-18 DIAGNOSIS — I872 Venous insufficiency (chronic) (peripheral): Secondary | ICD-10-CM | POA: Diagnosis not present

## 2021-06-18 DIAGNOSIS — D649 Anemia, unspecified: Secondary | ICD-10-CM | POA: Diagnosis not present

## 2021-06-18 DIAGNOSIS — E785 Hyperlipidemia, unspecified: Secondary | ICD-10-CM | POA: Diagnosis not present

## 2021-06-19 ENCOUNTER — Inpatient Hospital Stay: Payer: Medicare HMO

## 2021-06-19 ENCOUNTER — Inpatient Hospital Stay
Admission: EM | Admit: 2021-06-19 | Discharge: 2021-06-22 | DRG: 536 | Disposition: A | Discharge status: Home Health | Payer: Medicare HMO | Attending: Internal Medicine | Admitting: Internal Medicine

## 2021-06-19 ENCOUNTER — Emergency Department: Payer: Medicare HMO

## 2021-06-19 ENCOUNTER — Other Ambulatory Visit: Payer: Self-pay

## 2021-06-19 DIAGNOSIS — S72115A Nondisplaced fracture of greater trochanter of left femur, initial encounter for closed fracture: Principal | ICD-10-CM | POA: Diagnosis present

## 2021-06-19 DIAGNOSIS — E861 Hypovolemia: Secondary | ICD-10-CM | POA: Diagnosis present

## 2021-06-19 DIAGNOSIS — I2510 Atherosclerotic heart disease of native coronary artery without angina pectoris: Secondary | ICD-10-CM | POA: Diagnosis present

## 2021-06-19 DIAGNOSIS — S72009A Fracture of unspecified part of neck of unspecified femur, initial encounter for closed fracture: Secondary | ICD-10-CM | POA: Diagnosis present

## 2021-06-19 DIAGNOSIS — I517 Cardiomegaly: Secondary | ICD-10-CM | POA: Diagnosis not present

## 2021-06-19 DIAGNOSIS — Z7951 Long term (current) use of inhaled steroids: Secondary | ICD-10-CM

## 2021-06-19 DIAGNOSIS — Z96653 Presence of artificial knee joint, bilateral: Secondary | ICD-10-CM | POA: Diagnosis present

## 2021-06-19 DIAGNOSIS — G8918 Other acute postprocedural pain: Secondary | ICD-10-CM

## 2021-06-19 DIAGNOSIS — Z79899 Other long term (current) drug therapy: Secondary | ICD-10-CM

## 2021-06-19 DIAGNOSIS — Z8249 Family history of ischemic heart disease and other diseases of the circulatory system: Secondary | ICD-10-CM

## 2021-06-19 DIAGNOSIS — E785 Hyperlipidemia, unspecified: Secondary | ICD-10-CM | POA: Diagnosis present

## 2021-06-19 DIAGNOSIS — J849 Interstitial pulmonary disease, unspecified: Secondary | ICD-10-CM | POA: Diagnosis present

## 2021-06-19 DIAGNOSIS — I480 Paroxysmal atrial fibrillation: Secondary | ICD-10-CM | POA: Diagnosis not present

## 2021-06-19 DIAGNOSIS — Z7982 Long term (current) use of aspirin: Secondary | ICD-10-CM

## 2021-06-19 DIAGNOSIS — F32A Depression, unspecified: Secondary | ICD-10-CM | POA: Diagnosis present

## 2021-06-19 DIAGNOSIS — J189 Pneumonia, unspecified organism: Secondary | ICD-10-CM

## 2021-06-19 DIAGNOSIS — S72112A Displaced fracture of greater trochanter of left femur, initial encounter for closed fracture: Secondary | ICD-10-CM

## 2021-06-19 DIAGNOSIS — E1151 Type 2 diabetes mellitus with diabetic peripheral angiopathy without gangrene: Secondary | ICD-10-CM | POA: Diagnosis present

## 2021-06-19 DIAGNOSIS — J449 Chronic obstructive pulmonary disease, unspecified: Secondary | ICD-10-CM | POA: Diagnosis present

## 2021-06-19 DIAGNOSIS — E1165 Type 2 diabetes mellitus with hyperglycemia: Secondary | ICD-10-CM | POA: Diagnosis present

## 2021-06-19 DIAGNOSIS — Z955 Presence of coronary angioplasty implant and graft: Secondary | ICD-10-CM

## 2021-06-19 DIAGNOSIS — Z888 Allergy status to other drugs, medicaments and biological substances status: Secondary | ICD-10-CM

## 2021-06-19 DIAGNOSIS — N390 Urinary tract infection, site not specified: Secondary | ICD-10-CM | POA: Diagnosis present

## 2021-06-19 DIAGNOSIS — E781 Pure hyperglyceridemia: Secondary | ICD-10-CM | POA: Diagnosis present

## 2021-06-19 DIAGNOSIS — Z7901 Long term (current) use of anticoagulants: Secondary | ICD-10-CM

## 2021-06-19 DIAGNOSIS — M25559 Pain in unspecified hip: Secondary | ICD-10-CM | POA: Diagnosis not present

## 2021-06-19 DIAGNOSIS — F419 Anxiety disorder, unspecified: Secondary | ICD-10-CM | POA: Diagnosis present

## 2021-06-19 DIAGNOSIS — R059 Cough, unspecified: Secondary | ICD-10-CM | POA: Diagnosis not present

## 2021-06-19 DIAGNOSIS — F17210 Nicotine dependence, cigarettes, uncomplicated: Secondary | ICD-10-CM | POA: Diagnosis present

## 2021-06-19 DIAGNOSIS — M25552 Pain in left hip: Secondary | ICD-10-CM | POA: Diagnosis not present

## 2021-06-19 DIAGNOSIS — Z471 Aftercare following joint replacement surgery: Secondary | ICD-10-CM | POA: Diagnosis not present

## 2021-06-19 DIAGNOSIS — N179 Acute kidney failure, unspecified: Secondary | ICD-10-CM | POA: Diagnosis present

## 2021-06-19 DIAGNOSIS — Z96642 Presence of left artificial hip joint: Secondary | ICD-10-CM | POA: Diagnosis not present

## 2021-06-19 DIAGNOSIS — S72001A Fracture of unspecified part of neck of right femur, initial encounter for closed fracture: Secondary | ICD-10-CM

## 2021-06-19 DIAGNOSIS — I10 Essential (primary) hypertension: Secondary | ICD-10-CM | POA: Diagnosis present

## 2021-06-19 DIAGNOSIS — Z8616 Personal history of COVID-19: Secondary | ICD-10-CM

## 2021-06-19 DIAGNOSIS — R52 Pain, unspecified: Secondary | ICD-10-CM

## 2021-06-19 DIAGNOSIS — E871 Hypo-osmolality and hyponatremia: Secondary | ICD-10-CM | POA: Diagnosis present

## 2021-06-19 DIAGNOSIS — K219 Gastro-esophageal reflux disease without esophagitis: Secondary | ICD-10-CM | POA: Diagnosis present

## 2021-06-19 DIAGNOSIS — Y92009 Unspecified place in unspecified non-institutional (private) residence as the place of occurrence of the external cause: Secondary | ICD-10-CM

## 2021-06-19 DIAGNOSIS — G4733 Obstructive sleep apnea (adult) (pediatric): Secondary | ICD-10-CM | POA: Diagnosis present

## 2021-06-19 DIAGNOSIS — D500 Iron deficiency anemia secondary to blood loss (chronic): Secondary | ICD-10-CM | POA: Diagnosis not present

## 2021-06-19 DIAGNOSIS — X58XXXA Exposure to other specified factors, initial encounter: Secondary | ICD-10-CM | POA: Diagnosis present

## 2021-06-19 DIAGNOSIS — Z7984 Long term (current) use of oral hypoglycemic drugs: Secondary | ICD-10-CM

## 2021-06-19 DIAGNOSIS — Z20822 Contact with and (suspected) exposure to covid-19: Secondary | ICD-10-CM | POA: Diagnosis not present

## 2021-06-19 DIAGNOSIS — Z818 Family history of other mental and behavioral disorders: Secondary | ICD-10-CM

## 2021-06-19 DIAGNOSIS — S72001D Fracture of unspecified part of neck of right femur, subsequent encounter for closed fracture with routine healing: Secondary | ICD-10-CM | POA: Diagnosis not present

## 2021-06-19 DIAGNOSIS — R0602 Shortness of breath: Secondary | ICD-10-CM | POA: Diagnosis not present

## 2021-06-19 LAB — RESP PANEL BY RT-PCR (FLU A&B, COVID) ARPGX2
Influenza A by PCR: NEGATIVE
Influenza B by PCR: NEGATIVE
SARS Coronavirus 2 by RT PCR: NEGATIVE

## 2021-06-19 LAB — BASIC METABOLIC PANEL
Anion gap: 11 (ref 5–15)
BUN: 13 mg/dL (ref 8–23)
CO2: 21 mmol/L — ABNORMAL LOW (ref 22–32)
Calcium: 8.2 mg/dL — ABNORMAL LOW (ref 8.9–10.3)
Chloride: 102 mmol/L (ref 98–111)
Creatinine, Ser: 1.09 mg/dL — ABNORMAL HIGH (ref 0.44–1.00)
GFR, Estimated: 56 mL/min — ABNORMAL LOW (ref >60)
Glucose, Bld: 125 mg/dL — ABNORMAL HIGH (ref 70–99)
Potassium: 3.7 mmol/L (ref 3.5–5.1)
Sodium: 134 mmol/L — ABNORMAL LOW (ref 135–145)

## 2021-06-19 LAB — RETICULOCYTES
Immature Retic Fract: 31.2 % — ABNORMAL HIGH (ref 2.3–15.9)
RBC.: 3.95 MIL/uL (ref 3.87–5.11)
Retic Count, Absolute: 114.2 10*3/uL (ref 19.0–186.0)
Retic Ct Pct: 2.9 % (ref 0.4–3.1)

## 2021-06-19 LAB — CBC WITH DIFFERENTIAL/PLATELET
Abs Immature Granulocytes: 0.09 10*3/uL — ABNORMAL HIGH (ref 0.00–0.07)
Basophils Absolute: 0 10*3/uL (ref 0.0–0.1)
Basophils Relative: 0 %
Eosinophils Absolute: 0.2 10*3/uL (ref 0.0–0.5)
Eosinophils Relative: 1 %
HCT: 34.2 % — ABNORMAL LOW (ref 36.0–46.0)
Hemoglobin: 11.6 g/dL — ABNORMAL LOW (ref 12.0–15.0)
Immature Granulocytes: 1 %
Lymphocytes Relative: 10 %
Lymphs Abs: 1.3 10*3/uL (ref 0.7–4.0)
MCH: 32.7 pg (ref 26.0–34.0)
MCHC: 33.9 g/dL (ref 30.0–36.0)
MCV: 96.3 fL (ref 80.0–100.0)
Monocytes Absolute: 0.7 10*3/uL (ref 0.1–1.0)
Monocytes Relative: 5 %
Neutro Abs: 10.9 10*3/uL — ABNORMAL HIGH (ref 1.7–7.7)
Neutrophils Relative %: 83 %
Platelets: 193 10*3/uL (ref 150–400)
RBC: 3.55 MIL/uL — ABNORMAL LOW (ref 3.87–5.11)
RDW: 14.2 % (ref 11.5–15.5)
WBC: 13.3 10*3/uL — ABNORMAL HIGH (ref 4.0–10.5)
nRBC: 0 % (ref 0.0–0.2)

## 2021-06-19 LAB — URINALYSIS, ROUTINE W REFLEX MICROSCOPIC
Bilirubin Urine: NEGATIVE
Glucose, UA: NEGATIVE mg/dL
Hgb urine dipstick: NEGATIVE
Ketones, ur: NEGATIVE mg/dL
Leukocytes,Ua: NEGATIVE
Nitrite: POSITIVE — AB
Protein, ur: NEGATIVE mg/dL
Specific Gravity, Urine: 1.004 — ABNORMAL LOW (ref 1.005–1.030)
pH: 6 (ref 5.0–8.0)

## 2021-06-19 LAB — IRON AND TIBC
Iron: 31 ug/dL (ref 28–170)
Saturation Ratios: 12 % (ref 10.4–31.8)
TIBC: 258 ug/dL (ref 250–450)
UIBC: 227 ug/dL

## 2021-06-19 LAB — FERRITIN: Ferritin: 158 ng/mL (ref 11–307)

## 2021-06-19 LAB — GLUCOSE, CAPILLARY: Glucose-Capillary: 130 mg/dL — ABNORMAL HIGH (ref 70–99)

## 2021-06-19 LAB — HIV ANTIBODY (ROUTINE TESTING W REFLEX): HIV Screen 4th Generation wRfx: NONREACTIVE

## 2021-06-19 LAB — FOLATE: Folate: 35 ng/mL (ref >5.9)

## 2021-06-19 LAB — VITAMIN B12: Vitamin B-12: 319 pg/mL (ref 180–914)

## 2021-06-19 MED ORDER — OXYBUTYNIN CHLORIDE ER 5 MG PO TB24
10.0000 mg | ORAL_TABLET | Freq: Every day | ORAL | Status: DC
  Administered 2021-06-20 – 2021-06-22 (×3): 10 mg via ORAL
  Filled 2021-06-19 (×3): qty 2

## 2021-06-19 MED ORDER — MOMETASONE FURO-FORMOTEROL FUM 200-5 MCG/ACT IN AERO
2.0000 | INHALATION_SPRAY | Freq: Two times a day (BID) | RESPIRATORY_TRACT | Status: DC
  Administered 2021-06-20 – 2021-06-22 (×5): 2 via RESPIRATORY_TRACT
  Filled 2021-06-19 (×2): qty 8.8

## 2021-06-19 MED ORDER — TRAMADOL HCL 50 MG PO TABS
50.0000 mg | ORAL_TABLET | Freq: Four times a day (QID) | ORAL | Status: DC | PRN
  Administered 2021-06-21 – 2021-06-22 (×2): 50 mg via ORAL
  Filled 2021-06-19 (×2): qty 1

## 2021-06-19 MED ORDER — ROSUVASTATIN CALCIUM 10 MG PO TABS
20.0000 mg | ORAL_TABLET | Freq: Every day | ORAL | Status: DC
  Administered 2021-06-19 – 2021-06-21 (×3): 20 mg via ORAL
  Filled 2021-06-19: qty 1
  Filled 2021-06-19 (×3): qty 2

## 2021-06-19 MED ORDER — ALBUTEROL SULFATE HFA 108 (90 BASE) MCG/ACT IN AERS: 2.0000 | INHALATION_SPRAY | RESPIRATORY_TRACT | Status: DC | PRN

## 2021-06-19 MED ORDER — ACETAMINOPHEN 325 MG PO TABS: 650.0000 mg | ORAL_TABLET | Freq: Four times a day (QID) | ORAL | Status: DC | PRN

## 2021-06-19 MED ORDER — PANTOPRAZOLE SODIUM 40 MG PO TBEC
40.0000 mg | DELAYED_RELEASE_TABLET | Freq: Two times a day (BID) | ORAL | Status: DC
  Administered 2021-06-19 – 2021-06-22 (×6): 40 mg via ORAL
  Filled 2021-06-19 (×6): qty 1

## 2021-06-19 MED ORDER — ISOSORBIDE MONONITRATE ER 30 MG PO TB24
30.0000 mg | ORAL_TABLET | Freq: Every day | ORAL | Status: DC
  Administered 2021-06-19 – 2021-06-21 (×3): 30 mg via ORAL
  Filled 2021-06-19 (×3): qty 1

## 2021-06-19 MED ORDER — OXYCODONE HCL 5 MG PO TABS
5.0000 mg | ORAL_TABLET | ORAL | Status: DC | PRN
  Administered 2021-06-19 – 2021-06-21 (×6): 10 mg via ORAL
  Filled 2021-06-19 (×6): qty 2

## 2021-06-19 MED ORDER — UMECLIDINIUM BROMIDE 62.5 MCG/INH IN AEPB
1.0000 | INHALATION_SPRAY | Freq: Every day | RESPIRATORY_TRACT | Status: DC
  Administered 2021-06-20 – 2021-06-22 (×3): 1 via RESPIRATORY_TRACT
  Filled 2021-06-19: qty 7

## 2021-06-19 MED ORDER — LAMOTRIGINE 25 MG PO TABS
25.0000 mg | ORAL_TABLET | Freq: Every day | ORAL | Status: DC
  Administered 2021-06-20 – 2021-06-22 (×3): 25 mg via ORAL
  Filled 2021-06-19 (×3): qty 1

## 2021-06-19 MED ORDER — ONDANSETRON HCL 4 MG PO TABS: 4.0000 mg | ORAL_TABLET | Freq: Four times a day (QID) | ORAL | Status: DC | PRN

## 2021-06-19 MED ORDER — ALBUTEROL SULFATE (2.5 MG/3ML) 0.083% IN NEBU: 2.5000 mg | INHALATION_SOLUTION | RESPIRATORY_TRACT | Status: DC | PRN

## 2021-06-19 MED ORDER — OXYMETAZOLINE HCL 0.05 % NA SOLN
1.0000 | Freq: Two times a day (BID) | NASAL | Status: AC
  Administered 2021-06-19 – 2021-06-21 (×4): 1 via NASAL
  Filled 2021-06-19: qty 15

## 2021-06-19 MED ORDER — ONDANSETRON HCL 4 MG/2ML IJ SOLN: 4.0000 mg | Freq: Four times a day (QID) | INTRAMUSCULAR | Status: DC | PRN

## 2021-06-19 MED ORDER — BUDESON-GLYCOPYRROL-FORMOTEROL 160-9-4.8 MCG/ACT IN AERO: 2.0000 | INHALATION_SPRAY | Freq: Every day | RESPIRATORY_TRACT | Status: DC

## 2021-06-19 MED ORDER — AMLODIPINE BESYLATE 5 MG PO TABS
5.0000 mg | ORAL_TABLET | Freq: Every day | ORAL | Status: DC
  Administered 2021-06-20 – 2021-06-22 (×3): 5 mg via ORAL
  Filled 2021-06-19 (×3): qty 1

## 2021-06-19 MED ORDER — DULOXETINE HCL 60 MG PO CPEP
60.0000 mg | ORAL_CAPSULE | Freq: Every day | ORAL | Status: DC
  Administered 2021-06-20 – 2021-06-22 (×3): 60 mg via ORAL
  Filled 2021-06-19 (×3): qty 1

## 2021-06-19 MED ORDER — SODIUM CHLORIDE 0.9 % IV SOLN: INTRAVENOUS | Status: DC

## 2021-06-19 MED ORDER — ADULT MULTIVITAMIN W/MINERALS CH
ORAL_TABLET | Freq: Every day | ORAL | Status: DC
  Administered 2021-06-20 – 2021-06-22 (×3): 1 via ORAL
  Filled 2021-06-19 (×3): qty 1

## 2021-06-19 MED ORDER — PROPRANOLOL HCL 20 MG PO TABS
40.0000 mg | ORAL_TABLET | Freq: Every day | ORAL | Status: DC
  Administered 2021-06-19 – 2021-06-22 (×4): 40 mg via ORAL
  Filled 2021-06-19 (×4): qty 2

## 2021-06-19 MED ORDER — GABAPENTIN 300 MG PO CAPS
300.0000 mg | ORAL_CAPSULE | Freq: Every day | ORAL | Status: DC
  Administered 2021-06-19 – 2021-06-21 (×3): 300 mg via ORAL
  Filled 2021-06-19 (×3): qty 1

## 2021-06-19 MED ORDER — ASPIRIN EC 81 MG PO TBEC
81.0000 mg | DELAYED_RELEASE_TABLET | Freq: Every day | ORAL | Status: DC
  Administered 2021-06-20 – 2021-06-22 (×3): 81 mg via ORAL
  Filled 2021-06-19 (×3): qty 1

## 2021-06-19 NOTE — Progress Notes (Signed)
Patient came up from ED, ambulated to bathroom.   MD Rudene Christians paged for weight bearing restrictions,  WBAT no abduction exercises. Poggi to see her in AM
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
## 2021-06-19 NOTE — ED Triage Notes (Signed)
Pt comes into the ED via EMS from home , states she had left hip replacement 7/19 states she she felt a pop and is having pain in right leg last night  124/78 98%RA 94HR
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
## 2021-06-20 DIAGNOSIS — S72001A Fracture of unspecified part of neck of right femur, initial encounter for closed fracture: Secondary | ICD-10-CM | POA: Diagnosis not present

## 2021-06-20 LAB — BASIC METABOLIC PANEL
Anion gap: 8 (ref 5–15)
BUN: 10 mg/dL (ref 8–23)
CO2: 26 mmol/L (ref 22–32)
Calcium: 8.4 mg/dL — ABNORMAL LOW (ref 8.9–10.3)
Chloride: 104 mmol/L (ref 98–111)
Creatinine, Ser: 0.69 mg/dL (ref 0.44–1.00)
GFR, Estimated: >60 mL/min (ref >60)
Glucose, Bld: 101 mg/dL — ABNORMAL HIGH (ref 70–99)
Potassium: 4.1 mmol/L (ref 3.5–5.1)
Sodium: 138 mmol/L (ref 135–145)

## 2021-06-20 LAB — CBC
HCT: 36.4 % (ref 36.0–46.0)
Hemoglobin: 11.8 g/dL — ABNORMAL LOW (ref 12.0–15.0)
MCH: 31.6 pg (ref 26.0–34.0)
MCHC: 32.4 g/dL (ref 30.0–36.0)
MCV: 97.3 fL (ref 80.0–100.0)
Platelets: 179 10*3/uL (ref 150–400)
RBC: 3.74 MIL/uL — ABNORMAL LOW (ref 3.87–5.11)
RDW: 14.2 % (ref 11.5–15.5)
WBC: 7.3 10*3/uL (ref 4.0–10.5)
nRBC: 0 % (ref 0.0–0.2)

## 2021-06-20 LAB — GLUCOSE, CAPILLARY
Glucose-Capillary: 123 mg/dL — ABNORMAL HIGH (ref 70–99)
Glucose-Capillary: 216 mg/dL — ABNORMAL HIGH (ref 70–99)
Glucose-Capillary: 111 mg/dL — ABNORMAL HIGH (ref 70–99)
Glucose-Capillary: 136 mg/dL — ABNORMAL HIGH (ref 70–99)

## 2021-06-20 MED ORDER — INSULIN ASPART 100 UNIT/ML IJ SOLN: 0.0000 [IU] | Freq: Every day | INTRAMUSCULAR | Status: DC

## 2021-06-20 MED ORDER — ENOXAPARIN SODIUM 60 MG/0.6ML IJ SOSY
0.5000 mg/kg | PREFILLED_SYRINGE | INTRAMUSCULAR | Status: DC
  Administered 2021-06-20: 50 mg via SUBCUTANEOUS
  Filled 2021-06-20: qty 0.6

## 2021-06-20 MED ORDER — SIMETHICONE 80 MG PO CHEW
80.0000 mg | CHEWABLE_TABLET | Freq: Four times a day (QID) | ORAL | Status: DC
  Administered 2021-06-20 – 2021-06-21 (×5): 80 mg via ORAL
  Filled 2021-06-20 (×12): qty 1

## 2021-06-20 MED ORDER — INSULIN ASPART 100 UNIT/ML IJ SOLN
0.0000 [IU] | Freq: Three times a day (TID) | INTRAMUSCULAR | Status: DC
  Administered 2021-06-21 – 2021-06-22 (×3): 2 [IU] via SUBCUTANEOUS
  Filled 2021-06-20 (×3): qty 1

## 2021-06-20 MED ORDER — SODIUM CHLORIDE 0.9 % IV SOLN
1.0000 g | INTRAVENOUS | Status: DC
  Administered 2021-06-20 – 2021-06-21 (×2): 1 g via INTRAVENOUS
  Filled 2021-06-20 (×3): qty 10

## 2021-06-20 MED ORDER — LOPERAMIDE HCL 2 MG PO CAPS
2.0000 mg | ORAL_CAPSULE | ORAL | Status: DC | PRN
  Administered 2021-06-20: 2 mg via ORAL
  Filled 2021-06-20: qty 1

## 2021-06-20 NOTE — Progress Notes (Signed)
PROGRESS NOTE    Kathryn Friedman  [id number] DOB: [birth year] DOA: 06/19/2021 PCP: Einar Pheasant, MD   Brief Narrative:  65 y.o. 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 Patient notes since then she has not been able to bear weight on right leg and pain is persistent in lateral hip and upper leg.  EDP apparently contacted orthopedics who discussed the case with Dr. Roland Rack who performed the initial surgery.  His evaluation is currently pending.    Assessment & Plan:   Active Problems:   Hip fx (HCC)  Avulsion fracture of greater trochanter left hip Awaiting orthopedics recs Dr. Roland Rack is apparently aware Plan: Therapy evaluations Pain control Hold DOAC for now  Leukocytosis Unclear etiology Possibly related to urinary tract infection UA with positive nitrites Plan: Empiric Rocephin, limit to 3-day course Monitor fever curve  Acute kidney injury Hypovolemic hyponatremia Mild, creatinine baseline 0.64 Suspect prerenal azotemia driving AKI and hyponatremia Gently hydrate Monitor serum sodium and kidney function  Acute on chronic anemia Unclear etiology Possibly related to underlying vascular disease versus postoperative blood loss No acute bleeding noted Monitor H&H  Coronary artery disease Peripheral arterial disease Normal myocardial perfusion test on 6/22 No active anginal symptoms Continue statin, beta-blocker, Imdur  Hypertension PTA Imdur and amlodipine  Hyperlipidemia PTA statin  Paroxysmal atrial fibrillation PTA propranolol for rate control Holding DOAC pending surgical evaluation SQ Lovenox for VT prophylaxis  Anxiety/depression PTA SSRI  Asthma/COPD Not acutely  exacerbated PTA bronchodilators  Type 2 diabetes mellitus with hyperglycemia Holding home metformin Last hemoglobin A1c 7.1, moderate control Plan: Hold metformin Moderate sliding scale Carb modified diet     DVT prophylaxis: SQ Lovenox Code Status: Full Family Communication: Friend at bedside 7/27  disposition Plan: Status is: Inpatient  Remains inpatient appropriate because:Inpatient level of care appropriate due to severity of illness  Dispo: The patient is from: Home              Anticipated d/c is to: SNF              Patient currently is not medically stable to d/c.   Difficult to place patient No  Pending orthopedic surgery evaluation     Level of care: Med-Surg  Consultants:  Orthopedic surgery  Procedures:  None  Antimicrobials:  None   Subjective: Seen and examined.  Reports pain is moderate.  Objective: Vitals:   06/19/21 2346 06/20/21 0419 06/20/21 0422 06/20/21 0853  BP: (!) 119/55 138/74  (!) 155/84  Pulse: 70 78  76  Resp: '19 19  18  '$ Temp: 98 F (36.7 C) 97.6 F (36.4 C)  97.7 F (36.5 C)  TempSrc: Oral Oral    SpO2: 95% 95%  95%  Weight:   99.3 kg   Height:        Intake/Output Summary (Last 24 hours) at 06/20/2021 1418 Last data filed at 06/20/2021 0600 Gross per 24 hour  Intake 711.68 ml  Output 900 ml  Net -188.32 ml   Filed Weights   06/19/21 2000 06/20/21 0422  Weight: 101.1 kg 99.3 kg    Examination:  General exam: Appears calm and comfortable  Respiratory system: Clear to auscultation. Respiratory effort normal. Cardiovascular system: S1 & S2 heard, RRR.  No JVD, murmurs, rubs, gallops or clicks. No pedal edema. Gastrointestinal system: Abdomen is nondistended, soft and nontender. No organomegaly or masses felt. Normal bowel sounds heard. Central nervous system: Alert and oriented. No focal neurological deficits. Extremities: Left hip decreased range of motion.  Pain on palpation. Skin: No rashes, lesions or  ulcers Psychiatry: Judgement and insight appear normal. Mood & affect appropriate.     Data Reviewed: I have personally reviewed following labs and imaging studies  CBC: Recent Labs  Lab 06/19/21 1326 06/20/21 0601  WBC 13.3* 7.3  NEUTROABS 10.9*  --   HGB 11.6* 11.8*  HCT 34.2* 36.4  MCV 96.3 97.3  PLT 193 0000000   Basic Metabolic Panel: Recent Labs  Lab 06/19/21 1326 06/20/21 0601  NA 134* 138  K 3.7 4.1  CL 102 104  CO2 21* 26  GLUCOSE 125* 101*  BUN 13 10  CREATININE 1.09* 0.69  CALCIUM 8.2* 8.4*   GFR: Estimated Creatinine Clearance: 80.2 mL/min (by C-G formula based on SCr of 0.69 mg/dL). Liver Function Tests: No results for input(s): AST, ALT, ALKPHOS, BILITOT, PROT, ALBUMIN in the last 168 hours. No results for input(s): LIPASE, AMYLASE in the last 168 hours. No results for input(s): AMMONIA in the last 168 hours. Coagulation Profile: No results for input(s): INR, PROTIME in the last 168 hours. Cardiac Enzymes: No results for input(s): CKTOTAL, CKMB, CKMBINDEX, TROPONINI in the last 168 hours. BNP (last 3 results) No results for input(s): PROBNP in the last 8760 hours. HbA1C: No results for input(s): HGBA1C in the last 72 hours. CBG: Recent Labs  Lab 06/19/21 2043 06/20/21 0750 06/20/21 1131  GLUCAP 130* 123* 216*   Lipid Profile: No results for input(s): CHOL, HDL, LDLCALC, TRIG, CHOLHDL, LDLDIRECT in the last 72 hours. Thyroid Function Tests: No results for input(s): TSH, T4TOTAL, FREET4, T3FREE, THYROIDAB in the last 72 hours. Anemia Panel: Recent Labs    06/19/21 1903  VITAMINB12 319  FOLATE 35.0  FERRITIN 158  TIBC 258  IRON 31  RETICCTPCT 2.9   Sepsis Labs: No results for input(s): PROCALCITON, LATICACIDVEN in the last 168 hours.  Recent Results (from the past 240 hour(s))  SARS CORONAVIRUS 2 (TAT 6-24 HRS) Nasopharyngeal Nasopharyngeal Swab     Status: None   Collection Time: 06/11/21  9:36 AM   Specimen: Nasopharyngeal Swab   Result Value Ref Range Status   SARS Coronavirus 2 NEGATIVE NEGATIVE Final    Comment: (NOTE) SARS-CoV-2 target nucleic acids are NOT DETECTED.  The SARS-CoV-2 RNA is generally detectable in upper and lower respiratory specimens during the acute phase of infection. Negative results do not preclude SARS-CoV-2 infection, do not rule out co-infections with other pathogens, and should not be used as the sole basis for treatment or other patient management decisions. Negative results must be combined with clinical observations, patient history, and epidemiological information. The expected result is Negative.  Fact Sheet for Patients: SugarRoll.be  Fact Sheet for Healthcare Providers: https://www.woods-mathews.com/  This test is not yet approved or cleared by the [place] FDA and  has been authorized for detection and/or diagnosis of SARS-CoV-2 by FDA under an Emergency Use Authorization (EUA). This EUA will remain  in effect (meaning this test can be used) for the duration of the COVID-19 declaration under Se ction 564(b)(1) of the Act, 21 U.S.C. section 360bbb-3(b)(1), unless the authorization is terminated or revoked sooner.  Performed at [facility], [place] [address], [place], [place] [address]   Resp Panel by  RT-PCR (Flu A&B, Covid) Nasopharyngeal Swab     Status: None   Collection Time: 06/19/21  1:26 PM   Specimen: Nasopharyngeal Swab; Nasopharyngeal(NP) swabs in vial transport medium  Result Value Ref Range Status   SARS Coronavirus 2 by RT PCR NEGATIVE NEGATIVE Final    Comment: (NOTE) SARS-CoV-2 target nucleic acids are NOT DETECTED.  The SARS-CoV-2 RNA is generally detectable in upper respiratory specimens during the acute phase of infection. The lowest concentration of SARS-CoV-2 viral copies this assay can detect is 138 copies/mL. A negative result does not preclude SARS-Cov-2 infection and should not be used as  the sole basis for treatment or other patient management decisions. A negative result may occur with  improper specimen collection/handling, submission of specimen other than nasopharyngeal swab, presence of viral mutation(s) within the areas targeted by this assay, and inadequate number of viral copies(<138 copies/mL). A negative result must be combined with clinical observations, patient history, and epidemiological information. The expected result is Negative.  Fact Sheet for Patients:  EntrepreneurPulse.com.au  Fact Sheet for Healthcare Providers:  IncredibleEmployment.be  This test is no t yet approved or cleared by the [place] FDA and  has been authorized for detection and/or diagnosis of SARS-CoV-2 by FDA under an Emergency Use Authorization (EUA). This EUA will remain  in effect (meaning this test can be used) for the duration of the COVID-19 declaration under Section 564(b)(1) of the Act, 21 U.S.C.section 360bbb-3(b)(1), unless the authorization is terminated  or revoked sooner.       Influenza A by PCR NEGATIVE NEGATIVE Final   Influenza B by PCR NEGATIVE NEGATIVE Final    Comment: (NOTE) The Xpert Xpress SARS-CoV-2/FLU/RSV plus assay is intended as an aid in the diagnosis of influenza from Nasopharyngeal swab specimens and should not be used as a sole basis for treatment. Nasal washings and aspirates are unacceptable for Xpert Xpress SARS-CoV-2/FLU/RSV testing.  Fact Sheet for Patients: EntrepreneurPulse.com.au  Fact Sheet for Healthcare Providers: IncredibleEmployment.be  This test is not yet approved or cleared by the [place] FDA and has been authorized for detection and/or diagnosis of SARS-CoV-2 by FDA under an Emergency Use Authorization (EUA). This EUA will remain in effect (meaning this test can be used) for the duration of the COVID-19 declaration under Section 564(b)(1) of  the Act, 21 U.S.C. section 360bbb-3(b)(1), unless the authorization is terminated or revoked.  Performed at [facility], [address], [place], [place] [address]          Radiology Studies: X-ray chest PA and lateral  Result Date: 06/19/2021 CLINICAL DATA:  Cough and shortness of breath. EXAM: CHEST - 2 VIEW COMPARISON:  PA and lateral chest 04/03/2020 and 12/16/2006. CT chest 02/19/2021. FINDINGS: There is cardiomegaly without edema. No consolidative process, pneumothorax or effusion. Peribronchial thickening appears unchanged compared to the most recent plain films. No acute or focal bony abnormality. Aortic atherosclerosis. IMPRESSION: No acute disease. Chronic bronchitic change. Cardiomegaly. Aortic Atherosclerosis (ICD10-I70.0). Electronically Signed   By: Inge Rise M.D.   On: 06/19/2021 15:23   DG FEMUR MIN 2 VIEWS LEFT  Result Date: 06/19/2021 CLINICAL DATA:  Left hip pain radiating to knee EXAM: LEFT FEMUR 2 VIEWS COMPARISON:  06/11/2021 FINDINGS: Unchanged left hip arthroplasty alignment without evidence of loosening or migration. There is displacement of the greater trochanter superiorly by approximately 5-10 mm. There is no evidence of distal femur fracture. Normal alignment of the left knee arthroplasty. Vascular calcifications. IMPRESSION: New displacement of the greater trochanter by  5-10 mm compatible with avulsion fracture. Unchanged left hip arthroplasty alignment without evidence of loosening or migration. Electronically Signed   By: Maurine Simmering   On: 06/19/2021 11:34        Scheduled Meds:  amLODipine  5 mg Oral Daily   aspirin EC  81 mg Oral Daily   DULoxetine  60 mg Oral Daily   gabapentin  300 mg Oral QHS   isosorbide mononitrate  30 mg Oral Q2000   lamoTRIgine  25 mg Oral Daily   mometasone-formoterol  2 puff Inhalation BID   And   umeclidinium bromide  1 puff Inhalation Daily   multivitamin with minerals   Oral Daily   oxybutynin  10 mg  Oral Daily   oxymetazoline  1 spray Each Nare BID   pantoprazole  40 mg Oral BID AC   propranolol  40 mg Oral Daily   rosuvastatin  20 mg Oral QHS   simethicone  80 mg Oral QID   Continuous Infusions:  sodium chloride 75 mL/hr at 06/19/21 2027   cefTRIAXone (ROCEPHIN)  IV 1 g (06/20/21 0903)     LOS: 1 day    Time spent: 35 minutes    Sidney Ace, MD [facility] Pager [phone]  If 7PM-7AM, please contact night-coverage 06/20/2021, 2:18 PM

## 2021-06-20 NOTE — Progress Notes (Signed)
PHARMACIST - PHYSICIAN COMMUNICATION  CONCERNING:  Enoxaparin (Lovenox) for DVT Prophylaxis   DESCRIPTION: Patient was prescribed enoxaprin '40mg'$  q24 hours for VTE prophylaxis.   Filed Weights   06/19/21 2000 06/20/21 0422  Weight: 101.1 kg (222 lb 14.2 oz) 99.3 kg (218 lb 14.7 oz)    Body mass index is 37.58 kg/m.  Estimated Creatinine Clearance: 80.2 mL/min (by C-G formula based on SCr of 0.69 mg/dL).   Based on [place] patient is candidate for enoxaparin 0.'5mg'$ /kg TBW SQ every 24 hours based on BMI being >30.  RECOMMENDATION: Pharmacy has adjusted enoxaparin dose per [facility] policy.  Patient is now receiving enoxaparin 50 mg every 24 hours    Darnelle Bos, PharmD Clinical Pharmacist  06/20/2021 2:30 PM
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
## 2021-06-21 DIAGNOSIS — S72001D Fracture of unspecified part of neck of right femur, subsequent encounter for closed fracture with routine healing: Secondary | ICD-10-CM

## 2021-06-21 LAB — GLUCOSE, CAPILLARY
Glucose-Capillary: 120 mg/dL — ABNORMAL HIGH (ref 70–99)
Glucose-Capillary: 150 mg/dL — ABNORMAL HIGH (ref 70–99)
Glucose-Capillary: 133 mg/dL — ABNORMAL HIGH (ref 70–99)
Glucose-Capillary: 174 mg/dL — ABNORMAL HIGH (ref 70–99)

## 2021-06-21 MED ORDER — APIXABAN 2.5 MG PO TABS
5.0000 mg | ORAL_TABLET | Freq: Two times a day (BID) | ORAL | Status: DC
  Administered 2021-06-21 – 2021-06-22 (×3): 5 mg via ORAL
  Filled 2021-06-21 (×3): qty 2

## 2021-06-21 NOTE — Progress Notes (Signed)
PROGRESS NOTE    Kathryn Friedman  [id number] DOB: [birth year] DOA: 06/19/2021 PCP: Einar Pheasant, MD   Brief Narrative:  65 y.o. 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 Patient notes since then she has not been able to bear weight on right leg and pain is persistent in lateral hip and upper leg.  Consulted orthopedics Dr. Reinaldo Berber who did the initial surgery.'s consultation is appreciated.  Per orthopedics no surgical intervention necessary for new fracture.  Recommend weightbearing as tolerated especially during transfer, pain control, placement.  Therapy consulted.  Recommending skilled nursing facility.  TOC aware and will initiate bed search    Assessment & Plan:   Active Problems:   Hip fx ([place])  Avulsion fracture of greater trochanter left hip Orthopedics evaluated No surgical management recommended Plan: Pain control Weightbearing as tolerated Therapy evaluations Discharge to skilled nursing facility once bed is found  Leukocytosis Unclear etiology Possibly related to urinary tract infection UA with positive nitrites Plan: Continue Rocephin, dose 2/3 Monitor fever curve  Acute kidney injury Hypovolemic hyponatremia Mild, creatinine baseline 0.64 Suspect prerenal azotemia driving AKI and hyponatremia. Kidney function normalized Plan: DC IVF  Acute on chronic anemia Unclear etiology Possibly related to underlying vascular disease versus postoperative blood loss No acute bleeding noted Monitor H&H  Coronary artery disease Peripheral arterial disease Normal myocardial perfusion test on 6/22 No active anginal symptoms Continue statin, beta-blocker, Imdur  Hypertension PTA Imdur and  amlodipine  Hyperlipidemia PTA statin  Paroxysmal atrial fibrillation PTA propranolol for rate control Resumed apixaban 7/28  Anxiety/depression PTA SSRI  Asthma/COPD Not acutely exacerbated PTA bronchodilators  Type 2 diabetes mellitus with hyperglycemia Holding home metformin Last hemoglobin A1c 7.1, moderate control Plan: Hold metformin Moderate sliding scale Carb modified diet     DVT prophylaxis: SQ Lovenox Code Status: Full Family Communication: Friend at bedside 7/27  disposition Plan: Status is: Inpatient  Remains inpatient appropriate because:Inpatient level of care appropriate due to severity of illness  Dispo: The patient is from: Home              Anticipated d/c is to: SNF              Patient currently is not medically stable to d/c.   Difficult to place patient No  Orthopedic surgery consult appreciated.  No surgical intervention.  Pain control, placement.  Patient can discharge to skilled nursing facility once bed is found.     Level of care: Med-Surg  Consultants:  Orthopedic surgery  Procedures:  None  Antimicrobials:  None   Subjective: Seen and examined.  Pain well controlled  Objective: Vitals:   06/20/21 0853 06/20/21 1556 06/20/21 1955 06/21/21 0611  BP: (!) 155/84 103/63 (!) 146/77 137/73  Pulse: 76 66 71 75  Resp: '18 18 15 16  '$ Temp: 97.7 F (36.5 C) 98.7 F (37.1 C) 98.2 F (36.8 C) 98.3 F (36.8 C)  TempSrc:  Oral    SpO2: 95% 95% 97% 93%  Weight:      Height:        Intake/Output Summary (Last 24 hours) at 06/21/2021 1315 Last data filed at 06/21/2021 1031 Gross per 24 hour  Intake 2266.43 ml  Output 1900 ml  Net 366.43 ml  Filed Weights   06/19/21 2000 06/20/21 0422  Weight: 101.1 kg 99.3 kg    Examination:  General exam: No acute distress Respiratory system: Clear to auscultation. Respiratory effort normal. Cardiovascular system: S1-S2, regular rate and rhythm, no murmurs, no pedal edema   gastrointestinal system: Abdomen is nondistended, soft and nontender. No organomegaly or masses felt. Normal bowel sounds heard. Central nervous system: Alert and oriented. No focal neurological deficits. Extremities: Left hip painful on palpation.  Decreased range of motion Skin: No rashes, lesions or ulcers Psychiatry: Judgement and insight appear normal. Mood & affect appropriate.     Data Reviewed: I have personally reviewed following labs and imaging studies  CBC: Recent Labs  Lab 06/19/21 1326 06/20/21 0601  WBC 13.3* 7.3  NEUTROABS 10.9*  --   HGB 11.6* 11.8*  HCT 34.2* 36.4  MCV 96.3 97.3  PLT 193 0000000   Basic Metabolic Panel: Recent Labs  Lab 06/19/21 1326 06/20/21 0601  NA 134* 138  K 3.7 4.1  CL 102 104  CO2 21* 26  GLUCOSE 125* 101*  BUN 13 10  CREATININE 1.09* 0.69  CALCIUM 8.2* 8.4*   GFR: Estimated Creatinine Clearance: 80.2 mL/min (by C-G formula based on SCr of 0.69 mg/dL). Liver Function Tests: No results for input(s): AST, ALT, ALKPHOS, BILITOT, PROT, ALBUMIN in the last 168 hours. No results for input(s): LIPASE, AMYLASE in the last 168 hours. No results for input(s): AMMONIA in the last 168 hours. Coagulation Profile: No results for input(s): INR, PROTIME in the last 168 hours. Cardiac Enzymes: No results for input(s): CKTOTAL, CKMB, CKMBINDEX, TROPONINI in the last 168 hours. BNP (last 3 results) No results for input(s): PROBNP in the last 8760 hours. HbA1C: No results for input(s): HGBA1C in the last 72 hours. CBG: Recent Labs  Lab 06/20/21 1131 06/20/21 1620 06/20/21 2154 06/21/21 0738 06/21/21 1243  GLUCAP 216* 111* 136* 120* 150*   Lipid Profile: No results for input(s): CHOL, HDL, LDLCALC, TRIG, CHOLHDL, LDLDIRECT in the last 72 hours. Thyroid Function Tests: No results for input(s): TSH, T4TOTAL, FREET4, T3FREE, THYROIDAB in the last 72 hours. Anemia Panel: Recent Labs    06/19/21 1903  VITAMINB12 319  FOLATE 35.0   FERRITIN 158  TIBC 258  IRON 31  RETICCTPCT 2.9   Sepsis Labs: No results for input(s): PROCALCITON, LATICACIDVEN in the last 168 hours.  Recent Results (from the past 240 hour(s))  Resp Panel by RT-PCR (Flu A&B, Covid) Nasopharyngeal Swab     Status: None   Collection Time: 06/19/21  1:26 PM   Specimen: Nasopharyngeal Swab; Nasopharyngeal(NP) swabs in vial transport medium  Result Value Ref Range Status   SARS Coronavirus 2 by RT PCR NEGATIVE NEGATIVE Final    Comment: (NOTE) SARS-CoV-2 target nucleic acids are NOT DETECTED.  The SARS-CoV-2 RNA is generally detectable in upper respiratory specimens during the acute phase of infection. The lowest concentration of SARS-CoV-2 viral copies this assay can detect is 138 copies/mL. A negative result does not preclude SARS-Cov-2 infection and should not be used as the sole basis for treatment or other patient management decisions. A negative result may occur with  improper specimen collection/handling, submission of specimen other than nasopharyngeal swab, presence of viral mutation(s) within the areas targeted by this assay, and inadequate number of viral copies(<138 copies/mL). A negative result must be combined with clinical observations, patient history, and epidemiological information. The expected result is Negative.  Fact Sheet for Patients:  EntrepreneurPulse.com.au  Fact Sheet for Healthcare Providers:  IncredibleEmployment.be  This test is no t yet approved or cleared by the [place] and  has been authorized for detection and/or diagnosis of SARS-CoV-2 by FDA under an Emergency Use Authorization (EUA). This EUA will remain  in effect (meaning this test can be used) for the duration of the COVID-19 declaration under Section 564(b)(1) of the Act, 21 U.S.C.section 360bbb-3(b)(1), unless the authorization is terminated  or revoked sooner.       Influenza A by PCR NEGATIVE  NEGATIVE Final   Influenza B by PCR NEGATIVE NEGATIVE Final    Comment: (NOTE) The Xpert Xpress SARS-CoV-2/FLU/RSV plus assay is intended as an aid in the diagnosis of influenza from Nasopharyngeal swab specimens and should not be used as a sole basis for treatment. Nasal washings and aspirates are unacceptable for Xpert Xpress SARS-CoV-2/FLU/RSV testing.  Fact Sheet for Patients: EntrepreneurPulse.com.au  Fact Sheet for Healthcare Providers: IncredibleEmployment.be  This test is not yet approved or cleared by the [place] FDA and has been authorized for detection and/or diagnosis of SARS-CoV-2 by FDA under an Emergency Use Authorization (EUA). This EUA will remain in effect (meaning this test can be used) for the duration of the COVID-19 declaration under Section 564(b)(1) of the Act, 21 U.S.C. section 360bbb-3(b)(1), unless the authorization is terminated or revoked.  Performed at [facility], [address], [place], [place] [address]   Urine Culture     Status: Abnormal (Preliminary result)   Collection Time: 06/19/21  6:36 PM   Specimen: Urine, Clean Catch  Result Value Ref Range Status   Specimen Description   Final    URINE, CLEAN CATCH Performed at [facility], [address], [place], [place] [address]    Special Requests   Final    NONE Performed at [facility], [address], [place], [place] [address]    Culture (A)  Final    >=100,000 COLONIES/mL KLEBSIELLA PNEUMONIAE SUSCEPTIBILITIES TO FOLLOW Performed at [facility], [place] [address], [place], [place] [address]    Report Status PENDING  Incomplete         Radiology Studies: X-ray chest PA and lateral  Result Date: 06/19/2021 CLINICAL DATA:  Cough and shortness of breath. EXAM: CHEST - 2 VIEW COMPARISON:  PA and lateral chest 04/03/2020 and 12/16/2006. CT chest 02/19/2021. FINDINGS: There is cardiomegaly without edema. No  consolidative process, pneumothorax or effusion. Peribronchial thickening appears unchanged compared to the most recent plain films. No acute or focal bony abnormality. Aortic atherosclerosis. IMPRESSION: No acute disease. Chronic bronchitic change. Cardiomegaly. Aortic Atherosclerosis (ICD10-I70.0). Electronically Signed   By: Inge Rise M.D.   On: 06/19/2021 15:23        Scheduled Meds:  amLODipine  5 mg Oral Daily   apixaban  5 mg Oral BID   aspirin EC  81 mg Oral Daily   DULoxetine  60 mg Oral Daily   gabapentin  300 mg Oral QHS   insulin aspart  0-15 Units Subcutaneous TID WC   insulin aspart  0-5 Units Subcutaneous QHS   isosorbide mononitrate  30 mg Oral Q2000   lamoTRIgine  25 mg Oral Daily   mometasone-formoterol  2 puff Inhalation BID   And   umeclidinium bromide  1 puff Inhalation Daily   multivitamin with minerals   Oral Daily   oxybutynin  10 mg Oral Daily   oxymetazoline  1 spray Each Nare BID   pantoprazole  40 mg Oral BID AC   propranolol  40 mg Oral Daily   rosuvastatin  20 mg Oral QHS   simethicone  80 mg Oral QID   Continuous Infusions:  cefTRIAXone (ROCEPHIN)  IV 1 g (06/21/21 0758)     LOS: 2 days    Time spent: 25 minutes    Sidney Ace, MD [facility] Pager [phone]  If 7PM-7AM, please contact night-coverage 06/21/2021, 1:15 PM

## 2021-06-21 NOTE — Progress Notes (Signed)
Patient ID: Kathryn Friedman, female   DOB: Jan 04, 1956, 65 y.o.   MRN: [id number]  Subjective: The patient notes moderate improvement in her left hip symptoms as compared to yesterday.  She states that she was up with physical therapy today and was able to walk in the hallway and overall felt pretty good doing so.  She is optimistic about being able to return home rather than going to rehab.  She has no new complaints today.  Objective: Vital signs in last 24 hours: Temp:  [98.2 F (36.8 C)-98.7 F (37.1 C)] 98.6 F (37 C) (07/28 1509) Pulse Rate:  [65-75] 65 (07/28 1509) Resp:  [15-18] 16 (07/28 1509) BP: (103-146)/(63-77) 146/74 (07/28 1509) SpO2:  [93 %-97 %] 96 % (07/28 1509)  Intake/Output from previous day: 07/27 0701 - 07/28 0700 In: 2266.4 [P.O.:480; I.V.:1686.4; IV Piggyback:100] Out: 1900 [Urine:1900] Intake/Output this shift: Total I/O In: 240 [P.O.:240] Out: -   Recent Labs    06/19/21 1326 06/20/21 0601  HGB 11.6* 11.8*   Recent Labs    06/19/21 1326 06/19/21 1903 06/20/21 0601  WBC 13.3*  --  7.3  RBC 3.55* 3.95 3.74*  HCT 34.2*  --  36.4  PLT 193  --  179   Recent Labs    06/19/21 1326 06/20/21 0601  NA 134* 138  K 3.7 4.1  CL 102 104  CO2 21* 26  BUN 13 10  CREATININE 1.09* 0.69  GLUCOSE 125* 101*  CALCIUM 8.2* 8.4*   No results for input(s): LABPT, INR in the last 72 hours.  Physical Exam: Orthopedic examination is unchanged as compared to yesterday.  Her wound again appears to be healing well and is without evidence for infection.  She remains neurovascularly intact to the left lower extremity and foot.  Assessment: Essentially nondisplaced left greater trochanteric fracture status post left THA.  Plan: The patient will continue to be mobilized with physical therapy.  According the patient, the therapist told her that she was doing so well that she might be able to go home.  The formal note is not yet in the chart, but if the  physical therapist does feel that the patient is sufficiently independent with her walker to go home, then I would be fine with that as well.  She may continue to receive pain medication as deemed appropriate from a medical standpoint.   Marshall Cork Hasnain Manheim 06/21/2021, 3:33 PM

## 2021-06-21 NOTE — TOC Progression Note (Signed)
Transition of Care [facility]) - Progression Note    Patient Details  Name: Kathryn Friedman MRN: [id number] Date of Birth: 09/13/1956  [facility]) CM/SW [place], RN Phone Number: 06/21/2021, 4:06 PM  Clinical Narrative:      The patient is open with [facility] from previous DC      Expected Discharge Plan and Services                                                 Social Determinants of Health (SDOH) Interventions    Readmission Risk Interventions No flowsheet data found.

## 2021-06-21 NOTE — Evaluation (Signed)
Occupational Therapy Evaluation Patient Details Name: Kathryn Friedman MRN: [id number] DOB: 12-27-1955 Today's Date: 06/21/2021    History of Present Illness Kathryn Friedman is a 65 y.o. 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 Found to have Avulsion fracture of greater trochanter left hip.   Clinical Impression   Kathryn Friedman was seen for OT evaluation this date. Prior to hospital admission, pt was recently d/c home following L THA on 06/12/21, pt was MOD I for mobility. Pt lives with husband who provides transportation but no physical assist. Pt presents to acute OT demonstrating impaired ADL performance and functional mobility 2/2 decreased activity tolerance and functional strength/ROM/balance deficits.   Upon arrival pt reports bed wet, eager to get OOB to chair. Pt currently requires MAX A don B socks at bed level. MOD A exit L side of bed - assist to manage LLE. MIN A + RW for ADL t/f. MAX A perihygiene in standing - pt requires BUE support for dynamic standing balance. Pt would benefit from skilled OT to address noted impairments and functional limitations (see below for any additional details) in order to maximize safety and independence while minimizing falls risk and caregiver burden. Upon hospital discharge, recommend STR to maximize pt safety and return to PLOF.     Follow Up Recommendations  SNF    Equipment Recommendations  None recommended by OT    Recommendations for Other Services       Precautions / Restrictions Precautions Precautions: Posterior Hip;Fall Restrictions Weight Bearing Restrictions: Yes LLE Weight Bearing: Weight bearing as tolerated      Mobility Bed Mobility Overal bed mobility: Needs Assistance Bed Mobility: Supine to Sit      Supine to sit: Mod assist;HOB elevated     General bed mobility comments: assist for LLE mgmt    Transfers Overall transfer level: Needs assistance Equipment used: Rolling walker (2 wheeled) Transfers: Sit to/from Stand Sit to Stand: Min assist;From elevated surface              Balance Overall balance assessment: Needs assistance Sitting-balance support: No upper extremity supported;Feet supported Sitting balance-Leahy Scale: Good     Standing balance support: Bilateral upper extremity supported Standing balance-Leahy Scale: Fair                             ADL either performed or assessed with clinical judgement   ADL Overall ADL's : Needs assistance/impaired                                       General ADL Comments: MAX A don B socks at bed level. MIN A + RW for ADL t/f. MAX A perihygiene in standing - pt requires BUE support for dynamic standing balance.      Pertinent Vitals/Pain Pain Assessment: 0-10 Pain Score: 5  Pain Location: L hip Pain Descriptors / Indicators: Discomfort;Grimacing Pain Intervention(s): Limited activity within patient's tolerance;Repositioned     Hand Dominance Right   Extremity/Trunk Assessment Upper Extremity Assessment Upper Extremity Assessment: Overall WFL for tasks assessed   Lower Extremity Assessment Lower Extremity Assessment: Generalized weakness;LLE deficits/detail LLE Deficits / Details: unable to perform SLR from  bed level       Communication Communication Communication: No difficulties   Cognition Arousal/Alertness: Awake/alert Behavior During Therapy: WFL for tasks assessed/performed Overall Cognitive Status: Within Functional Limits for tasks assessed                                     General Comments       Exercises Exercises: Other exercises Other Exercises Other Exercises: Pt educated re: OT role, DME recs, d/c recs, falls prevention, HEP Other  Exercises: LBD, sup>sit, sitting/standing balance/tolerance, perihygiene        Home Living Family/patient expects to be discharged to:: Private residence Living Arrangements: Spouse/significant other Available Help at Discharge: Family;Available 24 hours/day (husband only assists with transportation) Type of Home: House Home Access: Stairs to enter [employer] of Steps: 4 Entrance Stairs-Rails: Can reach both Home Layout: One level               Home Equipment: Walker - 2 wheels;Bedside commode          Prior Functioning/Environment Level of Independence: Independent;Independent with assistive device(s)        Comments: Pt reports that she did not need AD and can be active in the community, but has had increasing staggering/catching herself on walls/furniture/etc but        OT Problem List: Decreased strength;Decreased range of motion;Decreased activity tolerance;Impaired balance (sitting and/or standing);Decreased safety awareness      OT Treatment/Interventions: Self-care/ADL training;Therapeutic exercise;Energy conservation;DME and/or AE instruction;Therapeutic activities;Patient/family education;Balance training    OT Goals(Current goals can be found in the care plan section) Acute Rehab OT Goals Patient Stated Goal: to go to rehab OT Goal Formulation: With patient Time For Goal Achievement: 07/05/21 Potential to Achieve Goals: Good ADL Goals Pt Will Perform Grooming: with modified independence;standing (c LRAD PRN) Pt Will Perform Lower Body Dressing: with min assist;with adaptive equipment;sitting/lateral leans Pt Will Transfer to Toilet: with modified independence;ambulating;regular height toilet (c LRAD PRN)  OT Frequency: Min 2X/week   Barriers to D/C: Inaccessible home environment             AM-PAC OT "6 Clicks" Daily Activity     Outcome Measure Help from another person eating meals?: None Help from another person taking care of  personal grooming?: A Little Help from another person toileting, which includes using toliet, bedpan, or urinal?: A Lot Help from another person bathing (including washing, rinsing, drying)?: A Lot Help from another person to put on and taking off regular upper body clothing?: None Help from another person to put on and taking off regular lower body clothing?: A Lot 6 Click Score: 17   End of Session    Activity Tolerance: Patient tolerated treatment well Patient left: in chair;with call bell/phone within reach;with chair alarm set  OT Visit Diagnosis: Other abnormalities of gait and mobility (R26.89)                Time: [id number] OT Time Calculation (min): 15 min Charges:  OT General Charges $OT Visit: 1 Visit OT Evaluation $OT Eval Low Complexity: 1 Low OT Treatments $Self Care/Home Management : 8-22 mins  Dessie Coma, M.S. OTR/L  06/21/21, 12:01 PM  ascom [phone]

## 2021-06-21 NOTE — Evaluation (Addendum)
Physical Therapy Evaluation Patient Details Name: Kathryn Friedman MRN: [id number] DOB: 1955/12/31 Today's Date: 06/21/2021   History of Present Illness  Kathryn Friedman is a 27yoF who comes to [facility] after spontaneous pop/pain in hip at home. Pt unable to mobilize effectively at home due to pain, found to have trochanteric avulsion fraction on Left hip. Pt is ~ 1 week s/p Left hip THA (posterolateral) with Milagros Evener. New fracture is to be treated conservatively, WBAT. PMH: CAD, PAD, HTN, HLD, PAF, asthma, COPD, ILD, DM2, GERD.  Clinical Impression  Pt admitted with above diagnosis. Pt currently with functional limitations due to the deficits listed below (see "PT Problem List"). Upon entry, pt on BSC, awake and agreeable to participate. The pt is alert, pleasant, interactive, and able to provide info regarding prior level of function, both in tolerance and independence. Pt having more difficulty with STS since avulsion fracture, but prior to that was advancing well after THA. Pt able to STS transfer c RW multiple times without assistance. 2 attempts to AMB are both met with presyncope at a level that is concerning to the patient. Pt has mild noted orthostasis in session, but vitals are not extensively gathered in session ( no supine BP, no 3 minutes BP) due to severe cuff pain. Of note pt had similar limitations prior admission, and reports these spells occurring "more recently," pt concerned and planning on discussing with MD at next PCP appt. Discussed with hospitalist, both author and patient feel that if presyncope can be eliminated, pt's AMB tolerance would be appropriate for return to home at DC. Patient's performance this date reveals decreased ability, independence, and tolerance in performing all basic mobility required for performance of activities of daily living. Pt requires additional DME, close physical assistance, and cues for safe participate in mobility. Pt will benefit from  skilled PT intervention to increase independence and safety with basic mobility in preparation for discharge to the venue listed below.   *Pt educated on use of couch as not compliant with posterior hip precautions, encouraged to find a high seated surface at home. Pt is agreeable.   Orthostatic VS for the past 24 hrs (Last 3 readings):  BP- Sitting Pulse- Sitting BP- Standing at 0 minutes Pulse- Standing at 0 minutes  06/21/21 1129 160/83 63 149/80 63       Follow Up Recommendations Home health PT    Equipment Recommendations  None recommended by PT    Recommendations for Other Services       Precautions / Restrictions Precautions Precautions: Posterior Hip;Fall Precaution Booklet Issued: Yes (comment) Precaution Comments: has been couch sitting at home, was not aware how this might break her precautions. Restrictions Weight Bearing Restrictions: Yes LLE Weight Bearing: Weight bearing as tolerated      Mobility  Bed Mobility Overal bed mobility: Needs Assistance Bed Mobility: Supine to Sit     Supine to sit: Mod assist;HOB elevated     General bed mobility comments: received on BSC    Transfers Overall transfer level: Needs assistance Equipment used: Rolling walker (2 wheeled) Transfers: Sit to/from Stand Sit to Stand: Supervision         General transfer comment: painful/difficult  Ambulation/Gait Ambulation/Gait assistance: Min guard Gait Distance (Feet): 60 Feet Assistive device: Rolling walker (2 wheeled)       General Gait Details: First attempt met with growing global fatigue, then lightheadedness, concerns of passing out. 2nd attempt (shorter) met with similar, but with clammy diaphoresis. Noted orthosatic drop.  Stairs            Wheelchair Mobility    Modified Rankin (Stroke Patients Only)       Balance Overall balance assessment: Mild deficits observed, not formally tested;Modified Independent Sitting-balance support: No upper  extremity supported;Feet supported Sitting balance-Leahy Scale: Good     Standing balance support: Bilateral upper extremity supported Standing balance-Leahy Scale: Fair                               Pertinent Vitals/Pain Pain Assessment: 0-10 Pain Score: 5  Pain Location: Lenft anterior knee (also agravated after surgery last week) Pain Descriptors / Indicators: Discomfort;Grimacing Pain Intervention(s): Limited activity within patient's tolerance;Monitored during session    Home Living Family/patient expects to be discharged to:: Private residence Living Arrangements: Spouse/significant other Available Help at Discharge: Family;Available 24 hours/day (husband only assists with transportation) Type of Home: House Home Access: Stairs to enter Entrance Stairs-Rails: Can reach both Entrance Stairs-Number of Steps: 4 Home Layout: One level Home Equipment: Walker - 2 wheels;Bedside commode      Prior Function Level of Independence: Independent;Independent with assistive device(s)         Comments: Pt reports that she did not need AD and can be active in the community, but has had increasing staggering/catching herself on walls/furniture/etc but     Hand Dominance   Dominant Hand: Right    Extremity/Trunk Assessment   Upper Extremity Assessment Upper Extremity Assessment: Generalized weakness;Overall [facility] for tasks assessed    Lower Extremity Assessment Lower Extremity Assessment: Generalized weakness;Overall WFL for tasks assessed LLE Deficits / Details: unable to perform SLR from bed level       Communication   Communication: No difficulties  Cognition Arousal/Alertness: Awake/alert Behavior During Therapy: WFL for tasks assessed/performed Overall Cognitive Status: Within Functional Limits for tasks assessed                           General Comments      Exercises Other Exercises Other Exercises: Pt educated re: OT role, DME recs,  d/c recs, falls prevention, HEP Other Exercises: LBD, sup>sit, sitting/standing balance/tolerance, perihygiene   Assessment/Plan    PT Assessment Patient needs continued PT services  PT Problem List Decreased strength;Decreased range of motion;Decreased activity tolerance;Decreased balance;Decreased mobility;Decreased coordination;Decreased knowledge of use of DME;Decreased safety awareness;Decreased knowledge of precautions;Cardiopulmonary status limiting activity       PT Treatment Interventions Gait training;Stair training;DME instruction;Functional mobility training;Therapeutic activities;Therapeutic exercise;Balance training;Neuromuscular re-education;Patient/family education    PT Goals (Current goals can be found in the Care Plan section)  Acute Rehab PT Goals Patient Stated Goal: Continue with progression of moblity at home. PT Goal Formulation: With patient Time For Goal Achievement: 07/05/21 Potential to Achieve Goals: Good    Frequency QD   Barriers to discharge        Co-evaluation               AM-PAC PT "6 Clicks" Mobility  Outcome Measure Help needed turning from your back to your side while in a flat bed without using bedrails?: None Help needed moving from lying on your back to sitting on the side of a flat bed without using bedrails?: A Little Help needed moving to and from a bed to a chair (including a wheelchair)?: A Little Help needed standing up from a chair using your arms (e.g., wheelchair or bedside chair)?: A Little  Help needed to walk in hospital room?: A Little Help needed climbing 3-5 steps with a railing? : A Lot 6 Click Score: 18    End of Session Equipment Utilized During Treatment: Gait belt Activity Tolerance: Patient tolerated treatment well;No increased pain Patient left: with chair alarm set;with call bell/phone within reach;in chair Nurse Communication: Mobility status PT Visit Diagnosis: Muscle weakness (generalized)  (M62.81);Difficulty in walking, not elsewhere classified (R26.2);Pain    Time: 5732-2567 PT Time Calculation (min) (ACUTE ONLY): 28 min   Charges:   PT Evaluation $PT Eval Moderate Complexity: 1 Mod PT Treatments $Gait Training: 8-22 mins       3:42 PM, 06/21/21 Etta Grandchild, PT, DPT Physical Therapist - [facility]  [phone] ([place])    [place] C 06/21/2021, 3:23 PM

## 2021-06-22 DIAGNOSIS — S72001D Fracture of unspecified part of neck of right femur, subsequent encounter for closed fracture with routine healing: Secondary | ICD-10-CM | POA: Diagnosis not present

## 2021-06-22 DIAGNOSIS — S72115A Nondisplaced fracture of greater trochanter of left femur, initial encounter for closed fracture: Secondary | ICD-10-CM | POA: Insufficient documentation

## 2021-06-22 LAB — URINE CULTURE: Culture: >=100000 — AB

## 2021-06-22 LAB — GLUCOSE, CAPILLARY: Glucose-Capillary: 146 mg/dL — ABNORMAL HIGH (ref 70–99)

## 2021-06-22 MED ORDER — CIPROFLOXACIN HCL 500 MG PO TABS
500.0000 mg | ORAL_TABLET | Freq: Two times a day (BID) | ORAL | Status: DC
  Administered 2021-06-22: 500 mg via ORAL
  Filled 2021-06-22: qty 1

## 2021-06-22 MED ORDER — OXYCODONE HCL 5 MG PO TABS: 5.0000 mg | ORAL_TABLET | ORAL | 0 refills | Status: DC | PRN

## 2021-06-22 MED ORDER — CIPROFLOXACIN HCL 500 MG PO TABS: 500.0000 mg | ORAL_TABLET | Freq: Once | ORAL | 0 refills | Status: AC

## 2021-06-22 NOTE — Progress Notes (Signed)
Physical Therapy Treatment Patient Details Name: Kathryn Friedman MRN: [id number] DOB: 05-12-1956 Today's Date: 06/22/2021    History of Present Illness Jee Soppe is a 53yoF who comes to [facility] after spontaneous pop/pain in hip at home. Pt unable to mobilize effectively at home due to pain, found to have trochanteric avulsion fraction on Left hip. Pt is ~ 1 week s/p Left hip THA (posterolateral) with Milagros Evener. New fracture is to be treated conservatively, WBAT. PMH: CAD, PAD, HTN, HLD, PAF, asthma, COPD, ILD, DM2, GERD.    PT Comments    Pt in recliner, dressed and ready to DC home. Pt transferring STS from chair without apparent hesitation, no RW, as she is anxious to AMB so she can leave. Pt able to circumnavigate the unit afoot without any lightheadedness, transient weakness, dyspnea, or LOB. Pt tolerating sustained gait today with significant improvement. Pt appropriate for return to home and resuming HHPT. Pt is ready for DC from PT standpoint.   Follow Up Recommendations  Home health PT     Equipment Recommendations  None recommended by PT    Recommendations for Other Services       Precautions / Restrictions Precautions Precautions: Posterior Hip;Fall Precaution Booklet Issued: Yes (comment) Restrictions Weight Bearing Restrictions: Yes LLE Weight Bearing: Weight bearing as tolerated    Mobility  Bed Mobility               General bed mobility comments: already in recliner at entry    Transfers Overall transfer level: Needs assistance Equipment used: None   Sit to Stand: Modified independent (Device/Increase time)            Ambulation/Gait Ambulation/Gait assistance: Supervision Gait Distance (Feet): 190 Feet Assistive device: Rolling walker (2 wheeled)   Gait velocity: 0.84ms   General Gait Details: consistent performance without syncopal prodrome. Pain in Left knee still limiting to a continuous 2-point gait.   Stairs              Wheelchair Mobility    Modified Rankin (Stroke Patients Only)       Balance                                            Cognition Arousal/Alertness: Awake/alert Behavior During Therapy: WFL for tasks assessed/performed Overall Cognitive Status: Within Functional Limits for tasks assessed                                        Exercises      General Comments        Pertinent Vitals/Pain Pain Assessment: 0-10 Pain Score: 5  Pain Location: Left anterior knee (also agravated after surgery last week) Pain Descriptors / Indicators: Discomfort;Grimacing Pain Intervention(s): Monitored during session;Patient requesting pain meds-RN notified    Home Living                      Prior Function            PT Goals (current goals can now be found in the care plan section) Acute Rehab PT Goals Patient Stated Goal: Continue with progression of moblity at home. PT Goal Formulation: With patient Time For Goal Achievement: 07/05/21 Potential to Achieve Goals: Good Progress towards PT goals: Progressing toward goals  Frequency    7X/week      PT Plan Current plan remains appropriate    Co-evaluation              AM-PAC PT "6 Clicks" Mobility   Outcome Measure  Help needed turning from your back to your side while in a flat bed without using bedrails?: None Help needed moving from lying on your back to sitting on the side of a flat bed without using bedrails?: A Little Help needed moving to and from a bed to a chair (including a wheelchair)?: A Little Help needed standing up from a chair using your arms (e.g., wheelchair or bedside chair)?: A Little Help needed to walk in hospital room?: A Little Help needed climbing 3-5 steps with a railing? : A Lot 6 Click Score: 18    End of Session Equipment Utilized During Treatment: Gait belt Activity Tolerance: Patient tolerated treatment well;No increased  pain Patient left: with chair alarm set;with call bell/phone within reach;in chair Nurse Communication: Mobility status PT Visit Diagnosis: Muscle weakness (generalized) (M62.81);Difficulty in walking, not elsewhere classified (R26.2);Pain     Time: 1100-1110 PT Time Calculation (min) (ACUTE ONLY): 10 min  Charges:  $Gait Training: 8-22 mins                    11:42 AM, 06/22/21 Etta Grandchild, PT, DPT Physical Therapist - [facility]  [phone] ([place])       [place] C 06/22/2021, 11:39 AM

## 2021-06-22 NOTE — Progress Notes (Signed)
Patient ID: Kathryn Friedman, female   DOB: 05-Jul-1956, 65 y.o.   MRN: [id number]  Subjective: The patient states that she is feeling better this morning.  She still has some pain in her hip, but is able to move around better.  She has no new orthopedic complaints.   Objective: Vital signs in last 24 hours: Temp:  [98.3 F (36.8 C)-98.6 F (37 C)] 98.3 F (36.8 C) (07/29 0750) Pulse Rate:  [65-81] 77 (07/29 0750) Resp:  [15-18] 16 (07/29 0750) BP: (126-160)/(63-80) 138/80 (07/29 0750) SpO2:  [96 %-100 %] 98 % (07/29 0750)  Intake/Output from previous day: 07/28 0701 - 07/29 0700 In: 240 [P.O.:240] Out: 900 [Urine:900] Intake/Output this shift: No intake/output data recorded.  Recent Labs    06/19/21 1326 06/20/21 0601  HGB 11.6* 11.8*   Recent Labs    06/19/21 1326 06/19/21 1903 06/20/21 0601  WBC 13.3*  --  7.3  RBC 3.55* 3.95 3.74*  HCT 34.2*  --  36.4  PLT 193  --  179   Recent Labs    06/19/21 1326 06/20/21 0601  NA 134* 138  K 3.7 4.1  CL 102 104  CO2 21* 26  BUN 13 10  CREATININE 1.09* 0.69  GLUCOSE 125* 101*  CALCIUM 8.2* 8.4*   No results for input(s): LABPT, INR in the last 72 hours.  Physical Exam: Orthopedic examination is a remains essentially unchanged as compared to yesterday and the day before.  Her wound continues to heal well and is without evidence for infection.  There is mild-moderate tenderness to palpation over the lateral aspect of the hip.  She is neurovascularly intact to the left lower extremity and foot.  Assessment: Essentially nondisplaced left greater trochanteric fracture status post left THA.  Plan: The patient will continue to be mobilized with physical therapy, weightbearing as tolerated on the left lower extremity with a walker.  The physical therapist's note from yesterday was reviewed.  If she is is cleared by physical therapy today, then she may be discharged home with resumption of home physical  therapy.  Thank you for asking me to be involved in the care of this most pleasant patient.  I will sign off at this time.  If you have further need of orthopedic intervention, please reconsult me.   Marshall Cork Enza Shone 06/22/2021, 7:58 AM

## 2021-06-22 NOTE — Progress Notes (Signed)
Pt discharged to home.  IV removed without complication.  AVS and prescriptions given to pt and explained with no further questions.  Pt transported off unit via WC.

## 2021-06-22 NOTE — Discharge Summary (Signed)
Physician Discharge Summary  Kathryn Friedman PFY:[id number] DOB: Oct 08, 1956 DOA: 06/19/2021  PCP: Einar Pheasant, MD  Admit date: 06/19/2021 Discharge date: 06/22/2021  Admitted From: Home Disposition: Home with home health  Recommendations for Outpatient Follow-up:  Follow up with PCP in 1-2 weeks Follow-up with orthopedic surgery  Home Health: Yes, home PT and OT Equipment/Devices: None  Discharge Condition: Stable CODE STATUS: Full Diet recommendation: Heart Healthy / Carb Modified  Brief/Interim Summary: 65 y.o. 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 Patient notes since then she has not been able to bear weight on right leg and pain is persistent in lateral hip and upper leg.   Consulted orthopedics Dr. Roland Rack who did the initial surgery.'s consultation is appreciated.  Per orthopedics no surgical intervention necessary for new fracture.  Recommend weightbearing as tolerated especially during transfer, pain control, placement.  Initial therapy recommendation was for SNF.  However the patient continued to progress clinically and discharge recommendations updated to home with home health.  Cleared by orthopedics.  Discharge Diagnoses:  Active Problems:   Hip fx ([place])  Avulsion fracture of greater trochanter left hip Orthopedics evaluated No surgical management recommended Plan: Discharge home with home health.  Pain control prescribed.  Continue home health therapy.  Follow-up orthopedics.   Leukocytosis Unclear etiology Possibly related to urinary tract infection UA with positive nitrites Plan: Completed 3-day course of antibiotics.  No antibiotics indicated on discharge   Acute kidney  injury Hypovolemic hyponatremia Mild, creatinine baseline 0.64 Suspect prerenal azotemia driving AKI and hyponatremia. Kidney function normalized   Acute on chronic anemia Unclear etiology Possibly related to underlying vascular disease versus postoperative blood loss No acute bleeding noted Monitor H&H.  Outpatient follow-up   Coronary artery disease Peripheral arterial disease Normal myocardial perfusion test on 6/22 No active anginal symptoms Continue statin, beta-blocker, Imdur   Hypertension PTA Imdur and amlodipine   Hyperlipidemia PTA statin   Paroxysmal atrial fibrillation PTA propranolol for rate control Resumed apixaban 7/28   Anxiety/depression PTA SSRI   Asthma/COPD Not acutely exacerbated PTA bronchodilators   Type 2 diabetes mellitus with hyperglycemia Holding home metformin Resume home regimen on discharge  Discharge Instructions  Discharge Instructions     Diet - low sodium heart healthy   Complete by: As directed    Increase activity slowly   Complete by: As directed    No wound care   Complete by: As directed       Allergies as of 06/22/2021       Reactions   Varenicline Other (See Comments)   "I got really depressed" "I got really depressed" CHANTEX   Jardiance [empagliflozin] Other (See Comments)   Yeast infection   Methylprednisolone Nausea Only, Nausea And Vomiting        Medication List     STOP taking these medications    traMADol 50 MG tablet Commonly known as: ULTRAM       TAKE these medications    acetaminophen 325 MG tablet Commonly known as: TYLENOL Take 650 mg by mouth every 6 (six) hours as needed.   amLODipine 5 MG tablet Commonly known as: NORVASC Take 1 tablet (5 mg total) by mouth daily.   apixaban 2.5 MG Tabs tablet Commonly known  asArne [place] Take 1 tablet (2.5 mg total) by mouth 2 (two) times daily.   aspirin 81 MG tablet Take 81 mg by mouth daily.   Breztri Aerosphere 160-9-4.8 MCG/ACT  Aero Generic drug: Budeson-Glycopyrrol-Formoterol Inhale 2 puffs into the lungs in the morning and at bedtime. Notes to patient: Not given in hospital   CALCIUM PO Take 600 mg by mouth daily. Notes to patient: Not given in hospital   ciprofloxacin 500 MG tablet Commonly known as: CIPRO Take 1 tablet (500 mg total) by mouth once for 1 dose. Take 1 tablet in the evening on 7/29.  This will complete your UTI course of treatment   diclofenac 75 MG EC tablet Commonly known as: VOLTAREN Take 75 mg by mouth 2 (two) times daily. Notes to patient: Not given in hospital   dicyclomine 20 MG tablet Commonly known as: BENTYL Take 20 mg by mouth every 6 (six) hours as needed. Notes to patient: Not given in hospital   DULoxetine 60 MG capsule Commonly known as: CYMBALTA Take 1 capsule (60 mg total) by mouth daily.   gabapentin 300 MG capsule Commonly known as: Neurontin Take 1 capsule (300 mg total) by mouth at bedtime.   glucose blood test strip Use as instructed to check blood sugars twice daily. Dx E11.9.   L-FORMULA LYSINE HCL PO Take 1,000 mg by mouth daily. Notes to patient: Not given in hospital   metFORMIN 500 MG 24 hr tablet Commonly known as: GLUCOPHAGE-XR Take 2 tablets (1,000 mg total) by mouth in the morning and at bedtime. Notes to patient: Not given in hospital   MULTIVITAMIN PO Take 1 tablet by mouth daily.   oxybutynin 10 MG 24 hr tablet Commonly known as: DITROPAN-XL Take 1 tablet (10 mg total) by mouth daily.   oxyCODONE 5 MG immediate release tablet Commonly known as: Oxy IR/ROXICODONE Take 1-2 tablets (5-10 mg total) by mouth every 4 (four) hours as needed for moderate pain (pain score 4-6).   oxymetazoline 0.05 % nasal spray Commonly known as: AFRIN Place 1 spray into both nostrils 2 (two) times daily.   Pfizer-BioNT COVID-19 Vac-TriS Susp injection Generic drug: COVID-19 mRNA Vac-TriS [employer]) Notes to patient: Not given in hospital   Potassium 99  MG Tabs Take 99 mg by mouth daily. Notes to patient: Not given in hospital   rosuvastatin 20 MG tablet Commonly known as: Crestor Take 1 tablet (20 mg total) by mouth daily.   telmisartan 80 MG tablet Commonly known as: Micardis Take 1 tablet (80 mg total) by mouth daily. Notes to patient: Not given in hospital   traZODone 100 MG tablet Commonly known as: DESYREL Take 2 tablets (200 mg total) by mouth at bedtime. Notes to patient: Not given in hospital   True Metrix Meter w/Device Kit       ASK your doctor about these medications    albuterol 108 (90 Base) MCG/ACT inhaler Commonly known as: Ventolin HFA INHALE 2 PUFFS FOUR TIMES A DAY Notes to patient: Not given in hospital   cyclobenzaprine 10 MG tablet Commonly known as: FLEXERIL TAKE 1 TABLET THREE TIMES DAILY AS NEEDED FOR MUSCLE SPASM(S) Notes to patient: Not given in hospital   famotidine 40 MG tablet Commonly known as: PEPCID TAKE 1 TABLET (40 MG TOTAL) BY MOUTH DAILY. Notes to patient: Not given in hospital   isosorbide mononitrate 30 MG 24 hr tablet Commonly known as: IMDUR TAKE 1 TABLET EVERY DAY   lamoTRIgine 25 MG tablet Commonly known as:  LAMICTAL TAKE 1 TABLET EVERY DAY FOR MOOD   mupirocin ointment 2 % Commonly known as: Bactroban Apply to affected area on abdomen bid Notes to patient: Not given in hospital   nystatin powder Commonly known as: MYCOSTATIN/NYSTOP APPLY TO THE AFFECTED AREA(S) TWICE DAILY Notes to patient: Not given in hospital   ondansetron 4 MG tablet Commonly known as: ZOFRAN Take 1 tablet (4 mg total) by mouth every 6 (six) hours as needed for nausea.   pantoprazole 40 MG tablet Commonly known as: PROTONIX TAKE 1 TABLET TWICE DAILY BEFORE MEALS   propranolol 40 MG tablet Commonly known as: INDERAL 1 tab daily PO        Allergies  Allergen Reactions   Varenicline Other (See Comments)    "I got really depressed" "I got really depressed" CHANTEX   Jardiance  [Empagliflozin] Other (See Comments)    Yeast infection   Methylprednisolone Nausea Only and Nausea And Vomiting    Consultations: Orthopedics   Procedures/Studies: X-ray chest PA and lateral  Result Date: 06/19/2021 CLINICAL DATA:  Cough and shortness of breath. EXAM: CHEST - 2 VIEW COMPARISON:  PA and lateral chest 04/03/2020 and 12/16/2006. CT chest 02/19/2021. FINDINGS: There is cardiomegaly without edema. No consolidative process, pneumothorax or effusion. Peribronchial thickening appears unchanged compared to the most recent plain films. No acute or focal bony abnormality. Aortic atherosclerosis. IMPRESSION: No acute disease. Chronic bronchitic change. Cardiomegaly. Aortic Atherosclerosis (ICD10-I70.0). Electronically Signed   By: Inge Rise M.D.   On: 06/19/2021 15:23   DG Pelvis Portable  Result Date: 06/12/2021 CLINICAL DATA:  Postop hip replacement. EXAM: PORTABLE PELVIS 1-2 VIEWS COMPARISON:  None. FINDINGS: Left hip arthroplasty in expected alignment. No periprosthetic lucency. Chronic changes are noted about the left greater trochanter with enthesopathy and cystic change. Recent postsurgical change includes air and edema in the soft tissues and lateral skin staples. IMPRESSION: Left hip arthroplasty without immediate postoperative complication. Electronically Signed   By: Keith Rake M.D.   On: 06/12/2021 14:48   DG FEMUR MIN 2 VIEWS LEFT  Result Date: 06/19/2021 CLINICAL DATA:  Left hip pain radiating to knee EXAM: LEFT FEMUR 2 VIEWS COMPARISON:  06/11/2021 FINDINGS: Unchanged left hip arthroplasty alignment without evidence of loosening or migration. There is displacement of the greater trochanter superiorly by approximately 5-10 mm. There is no evidence of distal femur fracture. Normal alignment of the left knee arthroplasty. Vascular calcifications. IMPRESSION: New displacement of the greater trochanter by 5-10 mm compatible with avulsion fracture. Unchanged left hip  arthroplasty alignment without evidence of loosening or migration. Electronically Signed   By: Maurine Simmering   On: 06/19/2021 11:34   (Echo, Carotid, EGD, Colonoscopy, ERCP)    Subjective: Seen and examined on the day of discharge.  Stable in no distress.  Comfortable discharge plan.  Discharge Exam: Vitals:   06/22/21 0446 06/22/21 0750  BP: 126/63 138/80  Pulse: 75 77  Resp: 15 16  Temp: 98.5 F (36.9 C) 98.3 F (36.8 C)  SpO2: 96% 98%   Vitals:   06/21/21 2012 06/22/21 0110 06/22/21 0446 06/22/21 0750  BP: (!) 160/75 128/77 126/63 138/80  Pulse: 75 81 75 77  Resp: _0 Temp: 98.3 F (36.8 C) 98.5 F (36.9 C) 98.5 F (36.9 C) 98.3 F (36.8 C)  TempSrc:      SpO2: 98% 100% 96% 98%  Weight:      Height:        General: Pt is alert, awake, not  in acute distress Cardiovascular: RRR, S1/S2 +, no rubs, no gallops Respiratory: CTA bilaterally, no wheezing, no rhonchi Abdominal: Soft, NT, ND, bowel sounds + Extremities: no edema, no cyanosis    The results of significant diagnostics from this hospitalization (including imaging, microbiology, ancillary and laboratory) are listed below for reference.     Microbiology: Recent Results (from the past 240 hour(s))  Resp Panel by RT-PCR (Flu A&B, Covid) Nasopharyngeal Swab     Status: None   Collection Time: 06/19/21  1:26 PM   Specimen: Nasopharyngeal Swab; Nasopharyngeal(NP) swabs in vial transport medium  Result Value Ref Range Status   SARS Coronavirus 2 by RT PCR NEGATIVE NEGATIVE Final    Comment: (NOTE) SARS-CoV-2 target nucleic acids are NOT DETECTED.  The SARS-CoV-2 RNA is generally detectable in upper respiratory specimens during the acute phase of infection. The lowest concentration of SARS-CoV-2 viral copies this assay can detect is 138 copies/mL. A negative result does not preclude SARS-Cov-2 infection and should not be used as the sole basis for treatment or other patient management decisions. A  negative result may occur with  improper specimen collection/handling, submission of specimen other than nasopharyngeal swab, presence of viral mutation(s) within the areas targeted by this assay, and inadequate number of viral copies(<138 copies/mL). A negative result must be combined with clinical observations, patient history, and epidemiological information. The expected result is Negative.  Fact Sheet for Patients:  EntrepreneurPulse.com.au  Fact Sheet for Healthcare Providers:  IncredibleEmployment.be  This test is no t yet approved or cleared by the [place] FDA and  has been authorized for detection and/or diagnosis of SARS-CoV-2 by FDA under an Emergency Use Authorization (EUA). This EUA will remain  in effect (meaning this test can be used) for the duration of the COVID-19 declaration under Section 564(b)(1) of the Act, 21 U.S.C.section 360bbb-3(b)(1), unless the authorization is terminated  or revoked sooner.       Influenza A by PCR NEGATIVE NEGATIVE Final   Influenza B by PCR NEGATIVE NEGATIVE Final    Comment: (NOTE) The Xpert Xpress SARS-CoV-2/FLU/RSV plus assay is intended as an aid in the diagnosis of influenza from Nasopharyngeal swab specimens and should not be used as a sole basis for treatment. Nasal washings and aspirates are unacceptable for Xpert Xpress SARS-CoV-2/FLU/RSV testing.  Fact Sheet for Patients: EntrepreneurPulse.com.au  Fact Sheet for Healthcare Providers: IncredibleEmployment.be  This test is not yet approved or cleared by the [place] FDA and has been authorized for detection and/or diagnosis of SARS-CoV-2 by FDA under an Emergency Use Authorization (EUA). This EUA will remain in effect (meaning this test can be used) for the duration of the COVID-19 declaration under Section 564(b)(1) of the Act, 21 U.S.C. section 360bbb-3(b)(1), unless the authorization  is terminated or revoked.  Performed at [facility], [address] [place], [place] [address]   Urine Culture     Status: Abnormal   Collection Time: 06/19/21  6:36 PM   Specimen: Urine, Clean Catch  Result Value Ref Range Status   Specimen Description   Final    URINE, CLEAN CATCH Performed at [facility], [address] [place], [place] [address]    Special Requests   Final    NONE Performed at [facility], [address] [place], [place] [address]    Culture >=100,000 COLONIES/mL KLEBSIELLA PNEUMONIAE (A)  Final   Report Status 06/22/2021 FINAL  Final   Organism ID, Bacteria KLEBSIELLA PNEUMONIAE (A)  Final  Susceptibility   Klebsiella pneumoniae - MIC*    AMPICILLIN >=32 RESISTANT Resistant     CEFAZOLIN <=4 SENSITIVE Sensitive     CEFEPIME <=0.12 SENSITIVE Sensitive     CEFTRIAXONE <=0.25 SENSITIVE Sensitive     CIPROFLOXACIN <=0.25 SENSITIVE Sensitive     GENTAMICIN <=1 SENSITIVE Sensitive     IMIPENEM <=0.25 SENSITIVE Sensitive     NITROFURANTOIN 64 INTERMEDIATE Intermediate     TRIMETH/SULFA >=320 RESISTANT Resistant     AMPICILLIN/SULBACTAM 8 SENSITIVE Sensitive     PIP/TAZO <=4 SENSITIVE Sensitive     * >=100,000 COLONIES/mL KLEBSIELLA PNEUMONIAE     Labs: BNP (last 3 results) No results for input(s): BNP in the last 8760 hours. Basic Metabolic Panel: Recent Labs  Lab 06/19/21 1326 06/20/21 0601  NA 134* 138  K 3.7 4.1  CL 102 104  CO2 21* 26  GLUCOSE 125* 101*  BUN 13 10  CREATININE 1.09* 0.69  CALCIUM 8.2* 8.4*   Liver Function Tests: No results for input(s): AST, ALT, ALKPHOS, BILITOT, PROT, ALBUMIN in the last 168 hours. No results for input(s): LIPASE, AMYLASE in the last 168 hours. No results for input(s): AMMONIA in the last 168 hours. CBC: Recent Labs  Lab 06/19/21 1326 06/20/21 0601  WBC 13.3* 7.3  NEUTROABS 10.9*  --   HGB 11.6* 11.8*  HCT 34.2* 36.4  MCV 96.3 97.3  PLT 193 179   Cardiac  Enzymes: No results for input(s): CKTOTAL, CKMB, CKMBINDEX, TROPONINI in the last 168 hours. BNP: Invalid input(s): POCBNP CBG: Recent Labs  Lab 06/21/21 0738 06/21/21 1243 06/21/21 1623 06/21/21 2141 06/22/21 0751  GLUCAP 120* 150* 133* 174* 146*   D-Dimer No results for input(s): DDIMER in the last 72 hours. Hgb A1c No results for input(s): HGBA1C in the last 72 hours. Lipid Profile No results for input(s): CHOL, HDL, LDLCALC, TRIG, CHOLHDL, LDLDIRECT in the last 72 hours. Thyroid function studies No results for input(s): TSH, T4TOTAL, T3FREE, THYROIDAB in the last 72 hours.  Invalid input(s): FREET3 Anemia work up Recent Labs    06/19/21 1903  VITAMINB12 319  FOLATE 35.0  FERRITIN 158  TIBC 258  IRON 31  RETICCTPCT 2.9   Urinalysis    Component Value Date/Time   COLORURINE YELLOW (A) 06/19/2021 1836   APPEARANCEUR HAZY (A) 06/19/2021 1836   APPEARANCEUR Hazy (A) 03/02/2020 1409   LABSPEC 1.004 (L) 06/19/2021 1836   PHURINE 6.0 06/19/2021 1836   GLUCOSEU NEGATIVE 06/19/2021 1836   HGBUR NEGATIVE 06/19/2021 1836   BILIRUBINUR NEGATIVE 06/19/2021 1836   BILIRUBINUR Negative 03/02/2020 1409   KETONESUR NEGATIVE 06/19/2021 1836   PROTEINUR NEGATIVE 06/19/2021 1836   UROBILINOGEN 0.2 03/18/2017 0851   NITRITE POSITIVE (A) 06/19/2021 1836   LEUKOCYTESUR NEGATIVE 06/19/2021 1836   Sepsis Labs Invalid input(s): PROCALCITONIN,  WBC,  LACTICIDVEN Microbiology Recent Results (from the past 240 hour(s))  Resp Panel by RT-PCR (Flu A&B, Covid) Nasopharyngeal Swab     Status: None   Collection Time: 06/19/21  1:26 PM   Specimen: Nasopharyngeal Swab; Nasopharyngeal(NP) swabs in vial transport medium  Result Value Ref Range Status   SARS Coronavirus 2 by RT PCR NEGATIVE NEGATIVE Final    Comment: (NOTE) SARS-CoV-2 target nucleic acids are NOT DETECTED.  The SARS-CoV-2 RNA is generally detectable in upper respiratory specimens during the acute phase of infection. The  lowest concentration of SARS-CoV-2 viral copies this assay can detect is 138 copies/mL. A negative result does not preclude SARS-Cov-2 infection and should not be  used as the sole basis for treatment or other patient management decisions. A negative result may occur with  improper specimen collection/handling, submission of specimen other than nasopharyngeal swab, presence of viral mutation(s) within the areas targeted by this assay, and inadequate number of viral copies(<138 copies/mL). A negative result must be combined with clinical observations, patient history, and epidemiological information. The expected result is Negative.  Fact Sheet for Patients:  EntrepreneurPulse.com.au  Fact Sheet for Healthcare Providers:  IncredibleEmployment.be  This test is no t yet approved or cleared by the [place] FDA and  has been authorized for detection and/or diagnosis of SARS-CoV-2 by FDA under an Emergency Use Authorization (EUA). This EUA will remain  in effect (meaning this test can be used) for the duration of the COVID-19 declaration under Section 564(b)(1) of the Act, 21 U.S.C.section 360bbb-3(b)(1), unless the authorization is terminated  or revoked sooner.       Influenza A by PCR NEGATIVE NEGATIVE Final   Influenza B by PCR NEGATIVE NEGATIVE Final    Comment: (NOTE) The Xpert Xpress SARS-CoV-2/FLU/RSV plus assay is intended as an aid in the diagnosis of influenza from Nasopharyngeal swab specimens and should not be used as a sole basis for treatment. Nasal washings and aspirates are unacceptable for Xpert Xpress SARS-CoV-2/FLU/RSV testing.  Fact Sheet for Patients: EntrepreneurPulse.com.au  Fact Sheet for Healthcare Providers: IncredibleEmployment.be  This test is not yet approved or cleared by the [place] FDA and has been authorized for detection and/or diagnosis of SARS-CoV-2 by FDA under  an Emergency Use Authorization (EUA). This EUA will remain in effect (meaning this test can be used) for the duration of the COVID-19 declaration under Section 564(b)(1) of the Act, 21 U.S.C. section 360bbb-3(b)(1), unless the authorization is terminated or revoked.  Performed at [facility], [address] [place], [place] [address]   Urine Culture     Status: Abnormal   Collection Time: 06/19/21  6:36 PM   Specimen: Urine, Clean Catch  Result Value Ref Range Status   Specimen Description   Final    URINE, CLEAN CATCH Performed at [facility], [address] [place], [place] [address]    Special Requests   Final    NONE Performed at [facility], [place], [place] [address]    Culture >=100,000 COLONIES/mL KLEBSIELLA PNEUMONIAE (A)  Final   Report Status 06/22/2021 FINAL  Final   Organism ID, Bacteria KLEBSIELLA PNEUMONIAE (A)  Final      Susceptibility   Klebsiella pneumoniae - MIC*    AMPICILLIN >=32 RESISTANT Resistant     CEFAZOLIN <=4 SENSITIVE Sensitive     CEFEPIME <=0.12 SENSITIVE Sensitive     CEFTRIAXONE <=0.25 SENSITIVE Sensitive     CIPROFLOXACIN <=0.25 SENSITIVE Sensitive     GENTAMICIN <=1 SENSITIVE Sensitive     IMIPENEM <=0.25 SENSITIVE Sensitive     NITROFURANTOIN 64 INTERMEDIATE Intermediate     TRIMETH/SULFA >=320 RESISTANT Resistant     AMPICILLIN/SULBACTAM 8 SENSITIVE Sensitive     PIP/TAZO <=4 SENSITIVE Sensitive     * >=100,000 COLONIES/mL KLEBSIELLA PNEUMONIAE     Time coordinating discharge: Over 30 minutes  SIGNED:   Sidney Ace, MD  [facility] 06/22/2021, 1:06 PM Pager   If 7PM-7AM, please contact night-coverage

## 2021-06-25 ENCOUNTER — Encounter: Payer: Self-pay | Admitting: Psychiatry

## 2021-06-25 ENCOUNTER — Telehealth (INDEPENDENT_AMBULATORY_CARE_PROVIDER_SITE_OTHER): Payer: Medicare HMO | Admitting: Psychiatry

## 2021-06-25 ENCOUNTER — Telehealth: Payer: Self-pay | Admitting: Internal Medicine

## 2021-06-25 ENCOUNTER — Other Ambulatory Visit: Payer: Self-pay

## 2021-06-25 DIAGNOSIS — J449 Chronic obstructive pulmonary disease, unspecified: Secondary | ICD-10-CM | POA: Diagnosis not present

## 2021-06-25 DIAGNOSIS — F3342 Major depressive disorder, recurrent, in full remission: Secondary | ICD-10-CM | POA: Diagnosis not present

## 2021-06-25 DIAGNOSIS — D649 Anemia, unspecified: Secondary | ICD-10-CM | POA: Diagnosis not present

## 2021-06-25 DIAGNOSIS — Z96642 Presence of left artificial hip joint: Secondary | ICD-10-CM | POA: Diagnosis not present

## 2021-06-25 DIAGNOSIS — F418 Other specified anxiety disorders: Secondary | ICD-10-CM

## 2021-06-25 DIAGNOSIS — I10 Essential (primary) hypertension: Secondary | ICD-10-CM | POA: Diagnosis not present

## 2021-06-25 DIAGNOSIS — I2510 Atherosclerotic heart disease of native coronary artery without angina pectoris: Secondary | ICD-10-CM | POA: Diagnosis not present

## 2021-06-25 DIAGNOSIS — F17200 Nicotine dependence, unspecified, uncomplicated: Secondary | ICD-10-CM

## 2021-06-25 DIAGNOSIS — G4701 Insomnia due to medical condition: Secondary | ICD-10-CM | POA: Diagnosis not present

## 2021-06-25 DIAGNOSIS — I872 Venous insufficiency (chronic) (peripheral): Secondary | ICD-10-CM | POA: Diagnosis not present

## 2021-06-25 DIAGNOSIS — E1151 Type 2 diabetes mellitus with diabetic peripheral angiopathy without gangrene: Secondary | ICD-10-CM | POA: Diagnosis not present

## 2021-06-25 DIAGNOSIS — E785 Hyperlipidemia, unspecified: Secondary | ICD-10-CM | POA: Diagnosis not present

## 2021-06-25 DIAGNOSIS — Z471 Aftercare following joint replacement surgery: Secondary | ICD-10-CM | POA: Diagnosis not present

## 2021-06-25 NOTE — Telephone Encounter (Signed)
Patient informed, Due to the high volume of calls and your symptoms we have to forward your call to our Triage Nurse to expedient your call. Please hold for the transfer.  Patient transferred to [facility] at Access Nurse. Due to being unable to urinate and thinks she may have a possible kidney infection.No appointments available in office or virtual.
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
## 2021-06-25 NOTE — Telephone Encounter (Signed)
Reviewed message. Called pt and explained I was out of office today.   Unable to reach.  Had to leave a message.  Please call and notify, If acute symptoms, needs to be evaluated.  Per review of chart, she completed course of abx during hospitalization.  If symptoms, will need reevaluation and f/u urine.

## 2021-06-25 NOTE — Telephone Encounter (Signed)
Called and spoke to Kathryn Friedman. Kathryn Friedman states that her back is hurting and her urine is dark In color. Pt states that there Is no burning or itching. Symptoms started when she was in the hospital and patient was given antibiotics. Pt was instructed to take Cipro and forgot about the medicine over the weekend. Pt took it today and states that it was only 1 pill. Pt asks if she needs any other antibiotics.  Pt called into access nurse stating that she was urinating frequently with a dark coloration. Pt was instructed to contact PCP within 24 hours.

## 2021-06-26 ENCOUNTER — Ambulatory Visit: Payer: Medicare HMO | Admitting: Pharmacist

## 2021-06-26 ENCOUNTER — Telehealth: Payer: Self-pay

## 2021-06-26 ENCOUNTER — Telehealth: Payer: Medicare HMO | Admitting: Family Medicine

## 2021-06-26 ENCOUNTER — Encounter: Payer: Self-pay | Admitting: Family Medicine

## 2021-06-26 DIAGNOSIS — F17200 Nicotine dependence, unspecified, uncomplicated: Secondary | ICD-10-CM

## 2021-06-26 DIAGNOSIS — I480 Paroxysmal atrial fibrillation: Secondary | ICD-10-CM

## 2021-06-26 DIAGNOSIS — J449 Chronic obstructive pulmonary disease, unspecified: Secondary | ICD-10-CM

## 2021-06-26 DIAGNOSIS — F330 Major depressive disorder, recurrent, mild: Secondary | ICD-10-CM

## 2021-06-26 DIAGNOSIS — I2510 Atherosclerotic heart disease of native coronary artery without angina pectoris: Secondary | ICD-10-CM

## 2021-06-26 DIAGNOSIS — E7800 Pure hypercholesterolemia, unspecified: Secondary | ICD-10-CM

## 2021-06-26 MED ORDER — APIXABAN 5 MG PO TABS: 5.0000 mg | ORAL_TABLET | Freq: Two times a day (BID) | ORAL | 2 refills | Status: DC

## 2021-06-26 NOTE — Telephone Encounter (Signed)
Transition Care Management Follow-up Telephone Call Date of discharge and from where: 06/22/21 from [facility] How have you been since you were released from the hospital? Frequent urination, not as dark, lower back still hurts a little, tylenol as needed. Drinking plenty of cranberry juice and water. Overall doing better. Denies fever, bleeding, burning and all other symptoms.  Any questions or concerns? No  Items Reviewed: Did the pt receive and understand the discharge instructions provided? Yes  Medications obtained and verified? Yes  Any new allergies since your discharge? No  Dietary orders reviewed? Yes Do you have support at home? Yes   Home Care and Equipment/Supplies: Were home health services ordered? [facility] twice weekly. Vital signs taken during visits. WNL.   Functional Questionnaire: (I = Independent and D = Dependent) ADLs: Assist as needed  Eating- I  Maintaining continence- I  Transferring/Ambulation- walker  Managing Meds- I  Follow up appointments reviewed:  PCP Hospital f/u appt confirmed? Yes  Scheduled to see Dr. Nicki Reaper on 06/27/21 @ 2:30 virtual [phone]. [facility] f/u appt confirmed? Yes  Scheduled to see Surgery f/u 06/27/21 at 10:15.  Are transportation arrangements needed? No  If their condition worsens, is the pt aware to call PCP or go to the Emergency Dept.? Yes Was the patient provided with contact information for the PCP's office or ED? Yes Was to pt encouraged to call back with questions or concerns? Yes

## 2021-06-26 NOTE — Progress Notes (Signed)
Pt had virtual visit scheduled at 6pm this evening. I sent link, waited in virtual space and called  patient multiple times up until 10 minutes after her appt time, but was not able to reach her. LM twice. Advised on message that she call PCP office to assist or reschedule or go to [facility] if needed care this evening. Dr. Maudie Mercury

## 2021-06-26 NOTE — Chronic Care Management (AMB) (Addendum)
Chronic Care Management Pharmacy Note  06/26/2021 Name:  Kathryn Friedman MRN:  [id number] DOB:  Feb 21, 1956   Subjective: Kathryn Friedman is an 65 y.o. year old female who is a primary patient of Einar Pheasant, MD.  The CCM team was consulted for assistance with disease management and care coordination needs.    Engaged with patient by telephone for  medication access  in response to provider referral for pharmacy case management and/or care coordination services.   Consent to Services:  The patient was given information about Chronic Care Management services, agreed to services, and gave verbal consent prior to initiation of services.  Please see initial visit note for detailed documentation.   Patient Care Team: Einar Pheasant, MD as PCP - General (Internal Medicine) De Hollingshead, RPH-CPP (Pharmacist)   Objective:  Lab Results  Component Value Date   CREATININE 0.69 06/20/2021   CREATININE 1.09 (H) 06/19/2021   CREATININE 0.64 06/13/2021    Lab Results  Component Value Date   HGBA1C 7.1 (H) 03/30/2021   Last diabetic Eye exam:  Lab Results  Component Value Date/Time   HMDIABEYEEXA No Retinopathy 11/06/2020 12:00 AM    Last diabetic Foot exam:  Lab Results  Component Value Date/Time   HMDIABFOOTEX by Dr Cleda Mccreedy - podiatry 12/10/2018 12:00 AM        Component Value Date/Time   CHOL 175 03/30/2021 0909   TRIG 298.0 (H) 03/30/2021 0909   HDL 41.70 03/30/2021 0909   CHOLHDL 4 03/30/2021 0909   VLDL 59.6 (H) 03/30/2021 0909   LDLCALC 53 07/06/2020 0824   LDLDIRECT 103.0 03/30/2021 0909    Hepatic Function Latest Ref Rng & Units 06/06/2021 05/23/2021 03/30/2021  Total Protein 6.5 - 8.1 g/dL 6.2(L) 6.2 6.7  Albumin 3.5 - 5.0 g/dL 3.4(L) 3.9 4.1  AST 15 - 41 U/L _0 ALT 0 - 44 U/L _1 Alk Phosphatase 38 - 126 U/L 54 69 70  Total Bilirubin 0.3 - 1.2 mg/dL 0.7 0.6 0.5  Bilirubin, Direct 0.0 - 0.3 mg/dL - 0.2 0.1    Lab Results   Component Value Date/Time   TSH 1.56 03/17/2020 09:01 AM   TSH 1.42 12/02/2017 09:32 AM    CBC Latest Ref Rng & Units 06/20/2021 06/19/2021 06/13/2021  WBC 4.0 - 10.5 K/uL 7.3 13.3(H) 14.2(H)  Hemoglobin 12.0 - 15.0 g/dL 11.8(L) 11.6(L) 14.5  Hematocrit 36.0 - 46.0 % 36.4 34.2(L) 43.4  Platelets 150 - 400 K/uL 179 193 169    No results found for: VD25OH  Clinical ASCVD: No  The 10-year ASCVD risk score Mikey Bussing DC Jr., et al., 2013) is: 28.9%   Values used to calculate the score:     Age: 21 years     Sex: Female     Is Non-Hispanic African American: No     Diabetic: Yes     Tobacco smoker: Yes     Systolic Blood Pressure: 032 mmHg     Is BP treated: Yes     HDL Cholesterol: 41.7 mg/dL     Total Cholesterol: 175 mg/dL     CHA2DS2-VASc Score = 5  This indicates a 7.2% annual risk of stroke. The patient's score is based upon: CHF History: No HTN History: Yes Diabetes History: Yes Stroke History: No Vascular Disease History: Yes Age Score: 1 Gender Score: 1   Social History   Tobacco Use  Smoking Status Every Day   Packs/day: 1.50   Years: 45.00  Pack years: 67.50   Types: Cigarettes  Smokeless Tobacco Never   BP Readings from Last 3 Encounters:  06/22/21 138/80  06/13/21 116/68  06/06/21 (!) 148/73   Pulse Readings from Last 3 Encounters:  06/22/21 77  06/13/21 71  06/06/21 70   Wt Readings from Last 3 Encounters:  06/20/21 218 lb 14.7 oz (99.3 kg)  06/12/21 213 lb (96.6 kg)  06/06/21 217 lb 13 oz (98.8 kg)    Assessment: Review of patient past medical history, allergies, medications, health status, including review of consultants reports, laboratory and other test data, was performed as part of comprehensive evaluation and provision of chronic care management services.   SDOH:  (Social Determinants of Health) assessments and interventions performed:    CCM Care Plan  Allergies  Allergen Reactions   Varenicline Other (See Comments)    "I got  really depressed" "I got really depressed" CHANTEX   Jardiance [Empagliflozin] Other (See Comments)    Yeast infection   Methylprednisolone Nausea Only and Nausea And Vomiting    Medications Reviewed Today     Reviewed by Ursula Alert, MD (Physician) on 06/25/21 at 50  Med List Status: <None>   Medication Order Taking? Sig Documenting Provider Last Dose Status Informant  acetaminophen (TYLENOL) 325 MG tablet [id number]  Take 650 mg by mouth every 6 (six) hours as needed. [provider]  Active Self  albuterol (VENTOLIN HFA) 108 (90 Base) MCG/ACT inhaler [id number]  INHALE 2 PUFFS FOUR TIMES A DAY  Patient taking differently: Inhale 2 puffs into the lungs every 4 (four) hours as needed for wheezing or shortness of breath.   Einar Pheasant, MD  Active Self           Med Note Darnelle Maffucci, Arville Lime   Fri May 11, 2021 10:50 AM) 0-2 times daily  amLODipine (NORVASC) 5 MG tablet [id number]  Take 1 tablet (5 mg total) by mouth daily. Einar Pheasant, MD  Active Self  apixaban (ELIQUIS) 2.5 MG TABS tablet [id number]  Take 1 tablet (2.5 mg total) by mouth 2 (two) times daily. Lattie Corns, PA-C  Active Self  aspirin 81 MG tablet [id number]  Take 81 mg by mouth daily.  [provider]  Active Self  Blood Glucose Monitoring Suppl (TRUE METRIX METER) w/Device KIT [id number]   [provider]  Active Self  Budeson-Glycopyrrol-Formoterol (BREZTRI AEROSPHERE) 160-9-4.8 MCG/ACT AERO [id number]  Inhale 2 puffs into the lungs in the morning and at bedtime. Einar Pheasant, MD  Active Self  CALCIUM PO [id number]  Take 600 mg by mouth daily.  [provider]  Active Self  ciprofloxacin (CIPRO) 500 MG tablet [id number] Yes Take 500 mg by mouth daily with breakfast. [provider] Taking Active   cyclobenzaprine (FLEXERIL) 10 MG tablet [id number]  TAKE 1 TABLET THREE TIMES DAILY AS NEEDED FOR MUSCLE SPASM(S)  Patient taking differently: Take 10 mg by mouth 3 (three)  times daily.   Einar Pheasant, MD  Active Self           Med Note Gentry Roch   Wed May 30, 2021 12:52 PM)    diclofenac (VOLTAREN) 75 MG EC tablet [id number]  Take 75 mg by mouth 2 (two) times daily. [provider]  Active Self  dicyclomine (BENTYL) 20 MG tablet [id number]  Take 20 mg by mouth every 6 (six) hours as needed. [provider]  Active Self  DULoxetine (CYMBALTA) 60 MG capsule [id number]  Take 1 capsule (60 mg total) by mouth  daily. Einar Pheasant, MD  Active Self  famotidine (PEPCID) 40 MG tablet [id number]  TAKE 1 TABLET (40 MG TOTAL) BY MOUTH DAILY.  Patient not taking: No sig reported   Einar Pheasant, MD  Active Self  gabapentin (NEURONTIN) 300 MG capsule [id number]  Take 1 capsule (300 mg total) by mouth at bedtime. Ursula Alert, MD  Active Self  glucose blood test strip [id number]  Use as instructed to check blood sugars twice daily. Dx E11.9. Einar Pheasant, MD  Active Self  isosorbide mononitrate (IMDUR) 30 MG 24 hr tablet [id number]  TAKE 1 TABLET EVERY DAY  Patient taking differently: Take 30 mg by mouth daily.   Einar Pheasant, MD  Active Self  L-FORMULA LYSINE HCL PO [id number]  Take 1,000 mg by mouth daily. [provider]  Active Self  lamoTRIgine (LAMICTAL) 25 MG tablet [id number]  TAKE 1 TABLET EVERY DAY FOR MOOD  Patient taking differently: 25 mg daily.   Ursula Alert, MD  Active Self  metFORMIN (GLUCOPHAGE-XR) 500 MG 24 hr tablet [id number]  Take 2 tablets (1,000 mg total) by mouth in the morning and at bedtime. Einar Pheasant, MD  Active Self  Multiple Vitamin (MULTIVITAMIN PO) [id number]  Take 1 tablet by mouth daily. [provider]  Active Self  mupirocin ointment (BACTROBAN) 2 % [id number]  Apply to affected area on abdomen bid  Patient not taking: No sig reported   Einar Pheasant, MD  Active Self  nystatin (MYCOSTATIN/NYSTOP) powder [id number]  APPLY TO THE AFFECTED AREA(S) TWICE DAILY  Patient taking  differently: Apply 1 application topically daily as needed (Yeast infection).   Einar Pheasant, MD  Active Self  ondansetron [facility]) 4 MG tablet [id number]  Take 1 tablet (4 mg total) by mouth every 6 (six) hours as needed for nausea.  Patient not taking: No sig reported   Lattie Corns, PA-C  Active Self  oxybutynin (DITROPAN-XL) 10 MG 24 hr tablet [id number]  Take 1 tablet (10 mg total) by mouth daily. Abbie Sons, MD  Active Self  oxyCODONE (OXY IR/ROXICODONE) 5 MG immediate release tablet [id number]  Take 1-2 tablets (5-10 mg total) by mouth every 4 (four) hours as needed for moderate pain (pain score 4-6). Sidney Ace, MD  Active   oxymetazoline (AFRIN) 0.05 % nasal spray [id number]  Place 1 spray into both nostrils 2 (two) times daily. [provider]  Active Self  pantoprazole (PROTONIX) 40 MG tablet [id number]  TAKE 1 TABLET TWICE DAILY BEFORE MEALS  Patient taking differently: Take 40 mg by mouth 2 (two) times daily.   Einar Pheasant, MD  Active Self  PFIZER-BIONT COVID-19 VAC-TRIS SUSP injection [id number]   [provider]  Active Self  Potassium 99 MG TABS [id number]  Take 99 mg by mouth daily. [provider]  Active Self  propranolol (INDERAL) 40 MG tablet [id number]  1 tab daily PO  Patient taking differently: Take 40 mg by mouth daily.   Einar Pheasant, MD  Active Self  rosuvastatin (CRESTOR) 20 MG tablet [id number]  Take 1 tablet (20 mg total) by mouth daily. Einar Pheasant, MD  Active Self  telmisartan (MICARDIS) 80 MG tablet [id number]  Take 1 tablet (80 mg total) by mouth daily. Einar Pheasant, MD  Active Self  traZODone (DESYREL) 100 MG tablet [id number]  Take 2 tablets (200 mg total) by mouth at bedtime. Einar Pheasant, MD  Active Self            Patient Active Problem List  Diagnosis Date Noted   Closed nondisplaced fracture of greater trochanter of left femur ([place]) 06/22/2021   Hip fx ([place]) 06/19/2021   Status post total  hip replacement, left 06/12/2021   Paroxysmal A-fib (H[facility]) 06/07/2021   Primary osteoarthritis of left hip 05/18/2021   Moderate episode of recurrent major depressive disorder ([place]) 04/05/2021   Aortic atherosclerosis ([place]) 04/03/2021   SOBOE (shortness of breath on exertion) 01/16/2021   Thrush 12/27/2020   Suspected COVID-19 virus infection 12/20/2020   MDD (major depressive disorder), recurrent episode, mild ([place]) 11/16/2020   Abdominal wall abscess 07/30/2020   Skin lesion 07/23/2020   Boil 07/20/2020   MDD (major depressive disorder), recurrent, in full remission ([place]) 06/26/2020   Insomnia due to medical condition 06/26/2020   Anxiety disorder 06/26/2020   Cough 04/03/2020   Urge incontinence 03/02/2020   Urinary frequency 02/06/2020   Bilateral carotid artery stenosis 10/15/2019   PSVT (paroxysmal supraventricular tachycardia) ([place]) 08/09/2019   History of migraine headaches 05/10/2019   Lower abdominal pain 03/21/2019   Dysphagia 09/15/2018   ILD (interstitial lung disease) ([place]) 08/17/2017   Tobacco use disorder 08/11/2016   Venous insufficiency of both lower extremities 07/10/2016   Healthcare maintenance 06/18/2016   Lump in the abdomen 08/06/2015   Dizziness 08/06/2015   Fatty infiltration of liver 08/11/2014   Blood in the stool 08/11/2014   Acute diarrhea 08/11/2014   Hemorrhage of gastrointestinal tract 08/07/2014   GERD (gastroesophageal reflux disease) 10/20/2013   Iron deficiency anemia 10/20/2013   Nausea with vomiting 10/19/2013   LUQ pain 10/19/2013   Diarrhea 09/26/2013   Anemia 04/12/2013   CAD (coronary artery disease) 02/18/2013   COPD (chronic obstructive pulmonary disease) ([place]) 02/18/2013   Obstructive sleep apnea 02/18/2013   Mild depression ([place]) 02/18/2013   Diverticulosis 02/18/2013   Diabetes ([place]) 02/17/2013   Essential hypertension, benign 02/17/2013   Hypercholesterolemia 02/17/2013    Immunization History  Administered Date(s)  Administered   Influenza,inj,Quad PF,6+ Mos 09/23/2013, 09/09/2014, 08/01/2015, 08/06/2016, 08/14/2017, 08/14/2018, 09/16/2019, 07/13/2020   Influenza-Unspecified 10/24/2011   PFIZER(Purple Top)SARS-COV-2 Vaccination 02/23/2020, 03/15/2020, 02/09/2021   Pneumococcal Polysaccharide-23 09/23/2013   Td 04/21/2018    Conditions to be addressed/monitored: CAD, HTN, HLD, and COPD  Care Plan : General Pharmacy (Adult)  Updates made by De Hollingshead, RPH-CPP since 06/26/2021 12:00 AM     Problem: COPD, CAD, HLD, HTN      Long-Range Goal: Disease Progression Prevention   This Visit's Progress: On track  Recent Progress: On track  Priority: High  Note:   Current Barriers:  Unable to independently afford treatment regimen Complex patient with multiple comorbidities including diabetes, coronary artery disease, COPD, depression/anxiety  Pharmacist Clinical Goal(s):  Over the next 90 days, patient will verbalize ability to afford treatment regimen. Over the next 90 days, patient will adhere to prescribed medication regimen  Interventions: 1:1 collaboration with Einar Pheasant, MD regarding development and update of comprehensive plan of care as evidenced by provider attestation and co-signature Inter-disciplinary care team collaboration (see longitudinal plan of care) Comprehensive medication review performed; medication list updated in electronic medical record  Acute Needs: Called in to report urinary symptoms yesterday. Reports feeling well today, no fever, some lower back pain that is relatively new (in the past several weeks), urine is still dark, but she was able to pee well this morning. Denies blood in urine. Discussed that we do not have openings in office today, and that we advise evaluation by Urgent Care. She notes she will  call and see what her copay would be, but if she can't afford the copay, she declines evaluation. Virtual opening w/ Dr. Maudie Mercury at [place] this afternoon.  Assisted patient in scheduling.   Diabetes: Controlled; current treatment: metformin XR 1000 mg BID Previously recommended to continue current regimen at this time.   Hypertension, Coronary Artery Disease, new diagnosis of AFib Inappropriately managed; current treatment: amlodipine 5 mg daily, isosorbide 30 mg daily, propranolol 40 mg daily, telmisartan 80 mg daily; follows w/ Dr. Nehemiah Massed. Per Care Everywhere, Holter from 01/2021 interpreted by Dr. Nehemiah Massed showed atrial fibrillation. Most recent preop visit notes that they planned to start anticoagulation after upcoming surgery. Patient was started on Eliquis 2.5 mg BID for VTE prophylaxis, though is listed in discharge summaries as Afib treatment.  CHADS2VASc= 5. Patient should receive anticoagulant treatment for stroke prevention. 2.5 mg dosing for VTE ppx is not appropriate for Afib stroke prevention, as patient does not meet criteria for renal dose reduction. Patient has 2 days of 2.5 mg dose remaining. Collaborated with Dr. Nicki Reaper to discuss. Increase to 5 mg BID. Will notify Dr. Nehemiah Massed Discussed patient assistance. Patient meets income criteria for eligibility, but unsure if she meets out of pocket spend. She will come by the office to sign patient assistance application, bring proof of out of pocket spend, and pick up sample of 5 mg Bid Discussed cost. She says GoodRx for Eliquis is $300. Reviewed that her insurance is unlikely to be this expensive. Sent script to [facility], patient will call and review copay.  Educated on "cheaper" alternative of warfarin but discussed clinical inferiority in addition to increased bleed risk, as well as enhanced monitoring requirements.   Hyperlipidemia, ASCVD Risk Reduction Controlled per last lipid panel; current treatment: atorvastatin 20 mg daily Antiplatelet therapy:  aspirin 81 mg daily; clopidogrel stopped due to concurrent Eliquis therapy.  Recommended to continue current treatment regimen  Chronic  Obstructive Pulmonary Disease: Controlled; current treatment: Breztri 160/9/4.8 mcg 2 puffs BID; albuterol HFA PRN - 1-2 times daily due to wheezing and SOB; follows w/ Dr. Raul Del Receiving Judithann Sauger through [facility] patient assistance Continue current regimen at this time along with pulmonology collaboration  Depression/Anxiety/Insomnia/Pain: Exacerbated by pain; follows w/ Dr. Shea Evans. Current regimen: lamotrigine 25 mg daily; duloxetine 60 mg daily, trazodone 200 mg QPM; started on hydroxyzine 25 mg QPM in May with no benefit, started on gabapentin 300 mg QPM earlier this month.  Does note some benefit w/ gabapentin. Discussed concern with multiple sedating medications. Consider trial of gabapentin w/o use of hydroxyzine to see if still able to sleep. Patient verbalized understanding.  Recommended to continue current regimen along with collaboration w/ psychiatry and LCSW.   Pain, s/p recent hip replacement with subsequent fracture Uncontrolled; current treatment: diclofenac 75 mg BID, cyclobenzaprine 5 mg up to TID for pain/muscle spasms, acetaminophen 500 mg BID PRN, tramadol 50 mg BID PRN, gabapentin as above. Follows w/ Tamala Julian, orthopedist Recent script for oxycodone 5 mg 1-2 tablets Q4H s/p hip replacement Recommend to continue current regimen at this time along with orthopedic collaboration  Diarrhea, chronic with GERD: Moderately well controlled per patient report; Current regimen: taking imodium daily from OTC insurance benefits; pantoprazole 40 mg BID with famotidine 40 mg daily, prescribed dicyclomine 10 mg PRN but has not started this yet Previously encouraged to continue to collaborate with GI to determine the best long term plan for management of chronic diarrhea.   Overactive Bladder: Managed per patient report; current regimen: oxybutynin XL 10 mg  daily, follows w/ Dr. Bernardo Heater Previously on tolterodine, beneficial at first but then waned over time Myrbetriq too expensive on her  insurance Recommended to continue current regimen and collaboration w/ urology  Tobacco Abuse: 1-1.5 packs per day; 50 years of use Previous quit attempts: unsuccessful using varenicline (nightmares) Triggers to smoke: stress, familial stress lately  Previously encouraged to start nicotine patches + gum when ready to quit. Will revisit discussion after pending surgical plan  Patient Goals/Self-Care Activities Over the next 90 days, patient will:  - take medications as prescribed collaborate with provider on medication access solutions  Follow Up Plan: Telephone follow up appointment with care management team member scheduled for:~ 6 weeks      Medication Assistance:  Breztri obtained through [facility] medication assistance program.  Enrollment ends 11/24/21.   Eliquis application in progress  Patient's preferred pharmacy is:  [facility] - [place], [place] [address] Phone: [phone] Fax: [phone]  [id number] [place] [address]Phone: [phone]: [phone]  Follow Up:  Patient agrees to Care Plan and Follow-up.  Plan: Telephone follow up appointment with care management team member scheduled for:  ~ 6 weeks  Catie TDarnelle Maffucci PharmD, [place] CPP Clinical Pharmacist [phone]  Medication Samples have been provided to the patient.  Drug name: Eliquis       Strength: 5 mg        Qty: 4 boxes  LOT: [id number]  Exp.Date: 03/2023

## 2021-06-26 NOTE — Telephone Encounter (Signed)
See note. Please call and see if she can bring in a specimen this am - so have results for virtual visit this pm.

## 2021-06-26 NOTE — Telephone Encounter (Signed)
I can work her in 2:30 tomorrow - ok if virtual.  If in office, please confirm passes screening.  I think she may have been scheduled work in appt this pm - not sure.  If so, ok to keep appt and we can schedule f/u down the road.

## 2021-06-26 NOTE — Telephone Encounter (Signed)
Spoke to patient. Reports feeling well today, no fever, some lower back pain that is relatively new (in the past several weeks), urine is still dark, but she was able to pee well this morning. Denies blood in urine. Discussed that we do not have openings in office today, and that we advise evaluation by Urgent Care. She notes she will call and see what her copay would be, but if she can't afford the copay, she declines evaluation.   Discussed with Kathryn Friedman. Opening for virtual appointment at 6 pm today at [facility] for virtual appointment. Scheduled. Messaged PCP to see about placing urine sample for patient to collect here.

## 2021-06-26 NOTE — Patient Instructions (Signed)
Visit Information  PATIENT GOALS:  Goals Addressed               This Visit's Progress     Patient Stated     Medication Monitoring (pt-stated)        Patient Goals/Self-Care Activities Over the next 90 days, patient will:  - take medications as prescribed collaborate with provider on medication access solutions         Patient verbalizes understanding of instructions provided today and agrees to view in [place].   Plan: Telephone follow up appointment with care management team member scheduled for:  ~ 6 weeks  [name], PharmD, [place], [place] Clinical Pharmacist [employer] at [employer] [phone]

## 2021-06-26 NOTE — Telephone Encounter (Signed)
Looks like she needs a hospital follow up and is having symptoms - possible UTI.  Does she need a TCM and can do f/u with her - may be able to work her in tomorrow.  Ok to do a virtual visit.

## 2021-06-26 NOTE — Telephone Encounter (Signed)
Of note, patient called my scheduler about cost of Eliquis. This is a new script. Please ask if blood in urine.   New Eliquis script given in hospital, noted as for DVT ppx (and dosed 2.5 mg BID) but patient also now has a diagnosis of atrial fibrillation per our EMR. This diagnosis also appears to be present on her 7/14 visit with Dr. Nehemiah Massed. There is a 3/7 Procedure Note at [facility] [place] [facility] where Dr. Nehemiah Massed noted atrial fibrillation on a Holter.   CHA2DS2-VASc Score = 5  This indicates a 7.2% annual risk of stroke. The patient's score is based upon: CHF History: No HTN History: Yes Diabetes History: Yes Stroke History: No Vascular Disease History: Yes Age Score: 1 Gender Score: 1   Patient should be on Afib dosing of Eliquis at 5 mg BID. I will call patient today and discuss, as well as discuss patient assistance.

## 2021-06-27 ENCOUNTER — Telehealth: Payer: Self-pay

## 2021-06-27 ENCOUNTER — Telehealth (INDEPENDENT_AMBULATORY_CARE_PROVIDER_SITE_OTHER): Payer: Medicare HMO | Admitting: Internal Medicine

## 2021-06-27 ENCOUNTER — Encounter: Payer: Self-pay | Admitting: Internal Medicine

## 2021-06-27 DIAGNOSIS — F320 Major depressive disorder, single episode, mild: Secondary | ICD-10-CM

## 2021-06-27 DIAGNOSIS — R350 Frequency of micturition: Secondary | ICD-10-CM | POA: Diagnosis not present

## 2021-06-27 DIAGNOSIS — D509 Iron deficiency anemia, unspecified: Secondary | ICD-10-CM | POA: Diagnosis not present

## 2021-06-27 DIAGNOSIS — E7800 Pure hypercholesterolemia, unspecified: Secondary | ICD-10-CM | POA: Diagnosis not present

## 2021-06-27 DIAGNOSIS — J449 Chronic obstructive pulmonary disease, unspecified: Secondary | ICD-10-CM | POA: Diagnosis not present

## 2021-06-27 DIAGNOSIS — R197 Diarrhea, unspecified: Secondary | ICD-10-CM

## 2021-06-27 DIAGNOSIS — E1159 Type 2 diabetes mellitus with other circulatory complications: Secondary | ICD-10-CM

## 2021-06-27 DIAGNOSIS — F418 Other specified anxiety disorders: Secondary | ICD-10-CM

## 2021-06-27 DIAGNOSIS — I10 Essential (primary) hypertension: Secondary | ICD-10-CM

## 2021-06-27 DIAGNOSIS — K219 Gastro-esophageal reflux disease without esophagitis: Secondary | ICD-10-CM

## 2021-06-27 DIAGNOSIS — G4701 Insomnia due to medical condition: Secondary | ICD-10-CM

## 2021-06-27 DIAGNOSIS — E878 Other disorders of electrolyte and fluid balance, not elsewhere classified: Secondary | ICD-10-CM

## 2021-06-27 DIAGNOSIS — F32A Depression, unspecified: Secondary | ICD-10-CM

## 2021-06-27 DIAGNOSIS — S72001D Fracture of unspecified part of neck of right femur, subsequent encounter for closed fracture with routine healing: Secondary | ICD-10-CM | POA: Diagnosis not present

## 2021-06-27 MED ORDER — GABAPENTIN 300 MG PO CAPS: 300.0000 mg | ORAL_CAPSULE | Freq: Every day | ORAL | 1 refills | Status: DC

## 2021-06-27 NOTE — Telephone Encounter (Signed)
pt called left message that she needs a refill on the gabapentin

## 2021-06-27 NOTE — Telephone Encounter (Signed)
LMTCB

## 2021-06-27 NOTE — Telephone Encounter (Signed)
Pt returned your call.  

## 2021-06-27 NOTE — Telephone Encounter (Signed)
See other phone note regarding appt.
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
## 2021-06-27 NOTE — Telephone Encounter (Signed)
I have sent gabapentin to pharmacy.

## 2021-06-29 ENCOUNTER — Ambulatory Visit: Payer: Medicare HMO | Admitting: Internal Medicine

## 2021-06-29 DIAGNOSIS — E1151 Type 2 diabetes mellitus with diabetic peripheral angiopathy without gangrene: Secondary | ICD-10-CM | POA: Diagnosis not present

## 2021-06-29 DIAGNOSIS — I2510 Atherosclerotic heart disease of native coronary artery without angina pectoris: Secondary | ICD-10-CM | POA: Diagnosis not present

## 2021-06-29 DIAGNOSIS — E785 Hyperlipidemia, unspecified: Secondary | ICD-10-CM | POA: Diagnosis not present

## 2021-06-29 DIAGNOSIS — I10 Essential (primary) hypertension: Secondary | ICD-10-CM | POA: Diagnosis not present

## 2021-06-29 DIAGNOSIS — Z96642 Presence of left artificial hip joint: Secondary | ICD-10-CM | POA: Diagnosis not present

## 2021-06-29 DIAGNOSIS — I872 Venous insufficiency (chronic) (peripheral): Secondary | ICD-10-CM | POA: Diagnosis not present

## 2021-06-29 DIAGNOSIS — J449 Chronic obstructive pulmonary disease, unspecified: Secondary | ICD-10-CM | POA: Diagnosis not present

## 2021-06-29 DIAGNOSIS — D649 Anemia, unspecified: Secondary | ICD-10-CM | POA: Diagnosis not present

## 2021-06-29 DIAGNOSIS — Z471 Aftercare following joint replacement surgery: Secondary | ICD-10-CM | POA: Diagnosis not present

## 2021-07-01 ENCOUNTER — Encounter: Payer: Self-pay | Admitting: Internal Medicine

## 2021-07-01 DIAGNOSIS — E878 Other disorders of electrolyte and fluid balance, not elsewhere classified: Secondary | ICD-10-CM | POA: Insufficient documentation

## 2021-07-01 NOTE — Assessment & Plan Note (Signed)
Continue symbicort.  Breathing stable.  Follow.

## 2021-07-01 NOTE — Assessment & Plan Note (Signed)
Blood pressure as outlined.  Continue micardis 80mg  q day.  Follow pressures.  Follow metabolic panel.

## 2021-07-01 NOTE — Assessment & Plan Note (Signed)
On crestor.  Low cholesterol diet and exercise.  Follow lipid panel and liver function tests.   
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
## 2021-07-01 NOTE — Assessment & Plan Note (Signed)
Recent admission.  Staples out.  Plan for home health PT.  Follow.

## 2021-07-01 NOTE — Assessment & Plan Note (Signed)
No urinary symptoms now.  Follow.  Declines urine check.  Will notify me if symptoms return.

## 2021-07-01 NOTE — Assessment & Plan Note (Signed)
Doing well on current regimen.  Followed by psychiatry.  Doing better.  Follow.

## 2021-07-01 NOTE — Assessment & Plan Note (Signed)
No upper symptoms reported.  On protonix.   

## 2021-07-01 NOTE — Assessment & Plan Note (Signed)
Improved overall.  Taking imodium prn.  Has been evaluated by GI.  Follow.

## 2021-07-01 NOTE — Assessment & Plan Note (Signed)
Follow cbc and iron studies.  

## 2021-07-01 NOTE — Assessment & Plan Note (Signed)
Electrolyte change in hospital.  Plan for recheck to confirm wnl

## 2021-07-01 NOTE — Assessment & Plan Note (Signed)
Low carb diet and exercise.  On metformin.  Follow met b and a1c. 

## 2021-07-02 ENCOUNTER — Telehealth: Payer: Self-pay

## 2021-07-02 DIAGNOSIS — Z471 Aftercare following joint replacement surgery: Secondary | ICD-10-CM | POA: Diagnosis not present

## 2021-07-02 DIAGNOSIS — D649 Anemia, unspecified: Secondary | ICD-10-CM | POA: Diagnosis not present

## 2021-07-02 DIAGNOSIS — J449 Chronic obstructive pulmonary disease, unspecified: Secondary | ICD-10-CM | POA: Diagnosis not present

## 2021-07-02 DIAGNOSIS — F330 Major depressive disorder, recurrent, mild: Secondary | ICD-10-CM

## 2021-07-02 DIAGNOSIS — I10 Essential (primary) hypertension: Secondary | ICD-10-CM | POA: Diagnosis not present

## 2021-07-02 DIAGNOSIS — I872 Venous insufficiency (chronic) (peripheral): Secondary | ICD-10-CM | POA: Diagnosis not present

## 2021-07-02 DIAGNOSIS — E1151 Type 2 diabetes mellitus with diabetic peripheral angiopathy without gangrene: Secondary | ICD-10-CM | POA: Diagnosis not present

## 2021-07-02 DIAGNOSIS — I2510 Atherosclerotic heart disease of native coronary artery without angina pectoris: Secondary | ICD-10-CM | POA: Diagnosis not present

## 2021-07-02 DIAGNOSIS — Z96642 Presence of left artificial hip joint: Secondary | ICD-10-CM | POA: Diagnosis not present

## 2021-07-02 DIAGNOSIS — E785 Hyperlipidemia, unspecified: Secondary | ICD-10-CM | POA: Diagnosis not present

## 2021-07-02 MED ORDER — LAMOTRIGINE 25 MG PO TABS: 25.0000 mg | ORAL_TABLET | Freq: Every day | ORAL | 0 refills | Status: DC

## 2021-07-02 NOTE — Telephone Encounter (Signed)
I have sent Lamictal to pharmacy. °

## 2021-07-02 NOTE — Telephone Encounter (Signed)
Patient had virtual visit with pcp on 8/3

## 2021-07-02 NOTE — Telephone Encounter (Signed)
received fax request for refills on the lamotrigine '25mg'$ 

## 2021-07-03 ENCOUNTER — Ambulatory Visit (INDEPENDENT_AMBULATORY_CARE_PROVIDER_SITE_OTHER): Payer: Medicare HMO | Admitting: Pharmacist

## 2021-07-03 DIAGNOSIS — I480 Paroxysmal atrial fibrillation: Secondary | ICD-10-CM

## 2021-07-03 DIAGNOSIS — I2510 Atherosclerotic heart disease of native coronary artery without angina pectoris: Secondary | ICD-10-CM | POA: Diagnosis not present

## 2021-07-03 DIAGNOSIS — Z96642 Presence of left artificial hip joint: Secondary | ICD-10-CM | POA: Diagnosis not present

## 2021-07-03 DIAGNOSIS — E7800 Pure hypercholesterolemia, unspecified: Secondary | ICD-10-CM

## 2021-07-03 DIAGNOSIS — J449 Chronic obstructive pulmonary disease, unspecified: Secondary | ICD-10-CM

## 2021-07-03 DIAGNOSIS — D649 Anemia, unspecified: Secondary | ICD-10-CM | POA: Diagnosis not present

## 2021-07-03 DIAGNOSIS — E1159 Type 2 diabetes mellitus with other circulatory complications: Secondary | ICD-10-CM

## 2021-07-03 DIAGNOSIS — I10 Essential (primary) hypertension: Secondary | ICD-10-CM

## 2021-07-03 DIAGNOSIS — E785 Hyperlipidemia, unspecified: Secondary | ICD-10-CM | POA: Diagnosis not present

## 2021-07-03 DIAGNOSIS — I872 Venous insufficiency (chronic) (peripheral): Secondary | ICD-10-CM | POA: Diagnosis not present

## 2021-07-03 DIAGNOSIS — E1151 Type 2 diabetes mellitus with diabetic peripheral angiopathy without gangrene: Secondary | ICD-10-CM | POA: Diagnosis not present

## 2021-07-03 DIAGNOSIS — Z471 Aftercare following joint replacement surgery: Secondary | ICD-10-CM | POA: Diagnosis not present

## 2021-07-03 NOTE — Patient Instructions (Signed)
Visit Information  PATIENT GOALS:  Goals Addressed               This Visit's Progress     Patient Stated     Medication Monitoring (pt-stated)        Patient Goals/Self-Care Activities Over the next 90 days, patient will:  - take medications as prescribed collaborate with provider on medication access solutions        Patient verbalizes understanding of instructions provided today and agrees to view in [place].    Plan: Telephone follow up appointment with care management team member scheduled for:  6 weeks  [name], PharmD, [place], [place] Clinical Pharmacist [employer] [facility] [employer] [phone]

## 2021-07-03 NOTE — Chronic Care Management (AMB) (Signed)
Chronic Care Management Pharmacy Note  07/03/2021 Name:  Kathryn Friedman MRN:  [id number] DOB:  02-28-1956   Subjective: Kathryn Friedman is an 65 y.o. year old female who is a primary patient of Einar Pheasant, MD.  The CCM team was consulted for assistance with disease management and care coordination needs.    Engaged with patient by telephone for follow up visit in response to provider referral for pharmacy case management and/or care coordination services.   Consent to Services:  The patient was given information about Chronic Care Management services, agreed to services, and gave verbal consent prior to initiation of services.  Please see initial visit note for detailed documentation.   Patient Care Team: Einar Pheasant, MD as PCP - General (Internal Medicine) De Hollingshead, RPH-CPP (Pharmacist)   Objective:  Lab Results  Component Value Date   CREATININE 0.69 06/20/2021   CREATININE 1.09 (H) 06/19/2021   CREATININE 0.64 06/13/2021    Lab Results  Component Value Date   HGBA1C 7.1 (H) 03/30/2021   Last diabetic Eye exam:  Lab Results  Component Value Date/Time   HMDIABEYEEXA No Retinopathy 11/06/2020 12:00 AM    Last diabetic Foot exam:  Lab Results  Component Value Date/Time   HMDIABFOOTEX by Dr Cleda Mccreedy - podiatry 12/10/2018 12:00 AM        Component Value Date/Time   CHOL 175 03/30/2021 0909   TRIG 298.0 (H) 03/30/2021 0909   HDL 41.70 03/30/2021 0909   CHOLHDL 4 03/30/2021 0909   VLDL 59.6 (H) 03/30/2021 0909   LDLCALC 53 07/06/2020 0824   LDLDIRECT 103.0 03/30/2021 0909    Hepatic Function Latest Ref Rng & Units 06/06/2021 05/23/2021 03/30/2021  Total Protein 6.5 - 8.1 g/dL 6.2(L) 6.2 6.7  Albumin 3.5 - 5.0 g/dL 3.4(L) 3.9 4.1  AST 15 - 41 U/L _0 ALT 0 - 44 U/L _1 Alk Phosphatase 38 - 126 U/L 54 69 70  Total Bilirubin 0.3 - 1.2 mg/dL 0.7 0.6 0.5  Bilirubin, Direct 0.0 - 0.3 mg/dL - 0.2 0.1    Lab Results   Component Value Date/Time   TSH 1.56 03/17/2020 09:01 AM   TSH 1.42 12/02/2017 09:32 AM    CBC Latest Ref Rng & Units 06/20/2021 06/19/2021 06/13/2021  WBC 4.0 - 10.5 K/uL 7.3 13.3(H) 14.2(H)  Hemoglobin 12.0 - 15.0 g/dL 11.8(L) 11.6(L) 14.5  Hematocrit 36.0 - 46.0 % 36.4 34.2(L) 43.4  Platelets 150 - 400 K/uL 179 193 169    No results found for: VD25OH  Clinical ASCVD: No  The 10-year ASCVD risk score Mikey Bussing DC Jr., et al., 2013) is: 24.8%   Values used to calculate the score:     Age: 45 years     Sex: Female     Is Non-Hispanic African American: No     Diabetic: Yes     Tobacco smoker: Yes     Systolic Blood Pressure: 675 mmHg     Is BP treated: Yes     HDL Cholesterol: 41.7 mg/dL     Total Cholesterol: 175 mg/dL     CHA2DS2-VASc Score = 5  This indicates a 7.2% annual risk of stroke. The patient's score is based upon: CHF History: No HTN History: Yes Diabetes History: Yes Stroke History: No Vascular Disease History: Yes Age Score: 1 Gender Score: 1     Social History   Tobacco Use  Smoking Status Every Day   Packs/day: 1.50   Years:  45.00   Pack years: 67.50   Types: Cigarettes  Smokeless Tobacco Never   BP Readings from Last 3 Encounters:  06/22/21 138/80  06/13/21 116/68  06/06/21 (!) 148/73   Pulse Readings from Last 3 Encounters:  06/22/21 77  06/13/21 71  06/06/21 70   Wt Readings from Last 3 Encounters:  06/20/21 218 lb 14.7 oz (99.3 kg)  06/12/21 213 lb (96.6 kg)  06/06/21 217 lb 13 oz (98.8 kg)    Assessment: Review of patient past medical history, allergies, medications, health status, including review of consultants reports, laboratory and other test data, was performed as part of comprehensive evaluation and provision of chronic care management services.   SDOH:  (Social Determinants of Health) assessments and interventions performed:    CCM Care Plan  Allergies  Allergen Reactions   Varenicline Other (See Comments)    "I got  really depressed" "I got really depressed" CHANTEX   Jardiance [Empagliflozin] Other (See Comments)    Yeast infection   Methylprednisolone Nausea Only and Nausea And Vomiting    Medications Reviewed Today     Reviewed by Einar Pheasant, MD (Physician) on 07/01/21 at 2320  Med List Status: <None>   Medication Order Taking? Sig Documenting Provider Last Dose Status Informant  acetaminophen (TYLENOL) 325 MG tablet [id number] Yes Take 650 mg by mouth every 6 (six) hours as needed. [provider] Taking Active Self  albuterol (VENTOLIN HFA) 108 (90 Base) MCG/ACT inhaler [id number] Yes INHALE 2 PUFFS FOUR TIMES A DAY  Patient taking differently: Inhale 2 puffs into the lungs every 4 (four) hours as needed for wheezing or shortness of breath.   Einar Pheasant, MD Taking Active            Med Note Darnelle Maffucci, Arville Lime   Fri May 11, 2021 10:50 AM) 0-2 times daily  amLODipine (NORVASC) 5 MG tablet [id number] Yes Take 1 tablet (5 mg total) by mouth daily. Einar Pheasant, MD Taking Active Self  apixaban (ELIQUIS) 5 MG TABS tablet [id number] Yes Take 1 tablet (5 mg total) by mouth 2 (two) times daily. Einar Pheasant, MD Taking Active   aspirin 81 MG tablet [id number] Yes Take 81 mg by mouth daily.  [provider] Taking Active Self  Blood Glucose Monitoring Suppl (TRUE METRIX METER) w/Device KIT [id number] Yes  [provider] Taking Active Self  Budeson-Glycopyrrol-Formoterol (BREZTRI AEROSPHERE) 160-9-4.8 MCG/ACT AERO [id number] Yes Inhale 2 puffs into the lungs in the morning and at bedtime. Einar Pheasant, MD Taking Active Self  CALCIUM PO [id number] Yes Take 600 mg by mouth daily.  [provider] Taking Active Self  ciprofloxacin (CIPRO) 500 MG tablet [id number] No Take 500 mg by mouth daily with breakfast.  Patient not taking: Reported on 06/27/2021   [provider] Not Taking Consider Medication Status and Discontinue   cyclobenzaprine (FLEXERIL) 10 MG  tablet [id number] Yes TAKE 1 TABLET THREE TIMES DAILY AS NEEDED FOR MUSCLE SPASM(S)  Patient taking differently: Take 10 mg by mouth 3 (three) times daily.   Einar Pheasant, MD Taking Active            Med Note Gentry Roch   Wed May 30, 2021 12:52 PM)    diclofenac (VOLTAREN) 75 MG EC tablet [id number] Yes Take 75 mg by mouth 2 (two) times daily. [provider] Taking Active Self  dicyclomine (BENTYL) 20 MG tablet [id number] Yes Take 20 mg by mouth every 6 (six) hours as needed. [provider] Taking Active Self  DULoxetine (CYMBALTA) 60 MG capsule [id number] Yes Take 1 capsule (60 mg total) by mouth daily. Einar Pheasant, MD Taking Active Self  famotidine (PEPCID) 40 MG tablet [id number] Yes TAKE 1 TABLET (40 MG TOTAL) BY MOUTH DAILY. Einar Pheasant, MD Taking Active   gabapentin (NEURONTIN) 300 MG capsule [id number]  Take 1 capsule (300 mg total) by mouth at bedtime. Ursula Alert, MD  Active   glucose blood test strip [id number] Yes Use as instructed to check blood sugars twice daily. Dx E11.9. Einar Pheasant, MD Taking Active Self  isosorbide mononitrate (IMDUR) 30 MG 24 hr tablet [id number] Yes TAKE 1 TABLET EVERY DAY  Patient taking differently: Take 30 mg by mouth daily.   Einar Pheasant, MD Taking Active   L-FORMULA LYSINE HCL PO [id number] Yes Take 1,000 mg by mouth daily. [provider] Taking Active Self  lamoTRIgine (LAMICTAL) 25 MG tablet [id number] Yes TAKE 1 TABLET EVERY DAY FOR MOOD  Patient taking differently: 25 mg daily.   Ursula Alert, MD Taking Active   metFORMIN (GLUCOPHAGE-XR) 500 MG 24 hr tablet [id number] Yes Take 2 tablets (1,000 mg total) by mouth in the morning and at bedtime. Einar Pheasant, MD Taking Active Self  Multiple Vitamin (MULTIVITAMIN PO) [id number] Yes Take 1 tablet by mouth daily. [provider] Taking Active Self  mupirocin ointment (BACTROBAN) 2 % [id number] No Apply to affected area on abdomen bid  Patient  not taking: No sig reported   Einar Pheasant, MD Not Taking Active Self  nystatin (MYCOSTATIN/NYSTOP) powder [id number] Yes APPLY TO THE AFFECTED AREA(S) TWICE DAILY  Patient taking differently: Apply 1 application topically daily as needed (Yeast infection).   Einar Pheasant, MD Taking Active   ondansetron [facility]) 4 MG tablet [id number] No Take 1 tablet (4 mg total) by mouth every 6 (six) hours as needed for nausea.  Patient not taking: No sig reported   Lattie Corns, PA-C Not Taking Active Self  oxybutynin (DITROPAN-XL) 10 MG 24 hr tablet [id number] Yes Take 1 tablet (10 mg total) by mouth daily. Abbie Sons, MD Taking Active Self  oxyCODONE (OXY IR/ROXICODONE) 5 MG immediate release tablet [id number] Yes Take 1-2 tablets (5-10 mg total) by mouth every 4 (four) hours as needed for moderate pain (pain score 4-6). Sidney Ace, MD Taking Active   oxymetazoline (AFRIN) 0.05 % nasal spray [id number] Yes Place 1 spray into both nostrils 2 (two) times daily. [provider] Taking Active Self  pantoprazole (PROTONIX) 40 MG tablet [id number] Yes TAKE 1 TABLET TWICE DAILY BEFORE MEALS  Patient taking differently: Take 40 mg by mouth 2 (two) times daily.   Einar Pheasant, MD Taking Active   Potassium 99 MG TABS [id number] Yes Take 99 mg by mouth daily. [provider] Taking Active Self  propranolol (INDERAL) 40 MG tablet [id number] Yes 1 tab daily PO  Patient taking differently: Take 40 mg by mouth daily.   Einar Pheasant, MD Taking Active   rosuvastatin (CRESTOR) 20 MG tablet [id number] Yes Take 1 tablet (20 mg total) by mouth daily. Einar Pheasant, MD Taking Active Self  telmisartan (MICARDIS) 80 MG tablet [id number] Yes Take 1 tablet (80 mg total) by mouth daily. Einar Pheasant, MD Taking Active Self  traZODone (DESYREL) 100 MG tablet [id number] Yes Take 2 tablets (200 mg total) by mouth at bedtime. Einar Pheasant, MD Taking Active Self            Patient  Active Problem List   Diagnosis Date Noted   Electrolyte  disorder 07/01/2021   Closed nondisplaced fracture of greater trochanter of left femur ([place]) 06/22/2021   Hip fx ([place]) 06/19/2021   Status post total hip replacement, left 06/12/2021   Paroxysmal A-fib ([place]) 06/07/2021   Primary osteoarthritis of left hip 05/18/2021   Moderate episode of recurrent major depressive disorder ([place]) 04/05/2021   Aortic atherosclerosis ([place]) 04/03/2021   SOBOE (shortness of breath on exertion) 01/16/2021   Thrush 12/27/2020   Suspected COVID-19 virus infection 12/20/2020   MDD (major depressive disorder), recurrent episode, mild ([place]) 11/16/2020   Abdominal wall abscess 07/30/2020   Skin lesion 07/23/2020   Boil 07/20/2020   MDD (major depressive disorder), recurrent, in full remission ([place]) 06/26/2020   Insomnia due to medical condition 06/26/2020   Anxiety disorder 06/26/2020   Cough 04/03/2020   Urge incontinence 03/02/2020   Urinary frequency 02/06/2020   Bilateral carotid artery stenosis 10/15/2019   PSVT (paroxysmal supraventricular tachycardia) ([place]) 08/09/2019   History of migraine headaches 05/10/2019   Lower abdominal pain 03/21/2019   Dysphagia 09/15/2018   ILD (interstitial lung disease) ([place]) 08/17/2017   Tobacco use disorder 08/11/2016   Venous insufficiency of both lower extremities 07/10/2016   Healthcare maintenance 06/18/2016   Lump in the abdomen 08/06/2015   Dizziness 08/06/2015   Fatty infiltration of liver 08/11/2014   Blood in the stool 08/11/2014   Acute diarrhea 08/11/2014   Hemorrhage of gastrointestinal tract 08/07/2014   GERD (gastroesophageal reflux disease) 10/20/2013   Iron deficiency anemia 10/20/2013   Nausea with vomiting 10/19/2013   LUQ pain 10/19/2013   Diarrhea 09/26/2013   Anemia 04/12/2013   CAD (coronary artery disease) 02/18/2013   COPD (chronic obstructive pulmonary disease) ([place]) 02/18/2013   Obstructive sleep apnea 02/18/2013   Mild  depression ([place]) 02/18/2013   Diverticulosis 02/18/2013   Diabetes ([place]) 02/17/2013   Essential hypertension, benign 02/17/2013   Hypercholesterolemia 02/17/2013    Immunization History  Administered Date(s) Administered   Influenza,inj,Quad PF,6+ Mos 09/23/2013, 09/09/2014, 08/01/2015, 08/06/2016, 08/14/2017, 08/14/2018, 09/16/2019, 07/13/2020   Influenza-Unspecified 10/24/2011   PFIZER(Purple Top)SARS-COV-2 Vaccination 02/23/2020, 03/15/2020, 02/09/2021   Pneumococcal Polysaccharide-23 09/23/2013   Td 04/21/2018    Conditions to be addressed/monitored: Atrial Fibrillation, HTN, HLD, and COPD  Care Plan : General Pharmacy (Adult)  Updates made by De Hollingshead, RPH-CPP since 07/03/2021 12:00 AM     Problem: COPD, CAD, HLD, HTN      Long-Range Goal: Disease Progression Prevention   This Visit's Progress: On track  Recent Progress: On track  Priority: High  Note:   Current Barriers:  Unable to independently afford treatment regimen Complex patient with multiple comorbidities including diabetes, coronary artery disease, COPD, depression/anxiety  Pharmacist Clinical Goal(s):  Over the next 90 days, patient will verbalize ability to afford treatment regimen. Over the next 90 days, patient will adhere to prescribed medication regimen  Interventions: 1:1 collaboration with Einar Pheasant, MD regarding development and update of comprehensive plan of care as evidenced by provider attestation and co-signature Inter-disciplinary care team collaboration (see longitudinal plan of care) Comprehensive medication review performed; medication list updated in electronic medical record  Diabetes: Controlled; current treatment: metformin XR 1000 mg BID Previously recommended to continue current regimen at this time.   Hypertension, Coronary Artery Disease, new diagnosis of AFib Inappropriately managed; current treatment: amlodipine 5 mg daily, isosorbide 30 mg daily, propranolol 40  mg daily, telmisartan 80 mg daily; follows w/ Dr. Nehemiah Massed. Per Care Everywhere, Holter from 01/2021 interpreted by Dr. Nehemiah Massed showed atrial fibrillation. Most recent  preop visit notes that they planned to start anticoagulation after upcoming surgery. Appears per chart review that Dr. Roland Rack noted she could change to aspirin 325 mg daily. This is appropriate treatment for DVT ppx but not for atrial fibrillation Contacted Dr. Alveria Apley office, spoke with RN Claiborne Billings. Inquired if Dr. Nehemiah Massed agreed that patient should be on anticoagulation for atrial fibrillation (CHADS2VASc = 5). Receive call back from [facility], Dr. Nehemiah Massed agrees that patient should be on chronic anticoagulation for atrial fibrillation, agrees with Eliquis 5 mg BID.  Contacted Dr. Nicholaus Bloom office to communicate that patient needs to continue Afib dosing of Eliquis, therefore likely does not need aspirin therapy for VTE ppx. Left message for his clinical team. Contacted patient to review the above. Reports home vitals along with some occasional palpitations, headaches, shortness of breath and weakness. Denies chest pain. Collaborated w/ Dr. Alveria Apley office. She is being worked in for a visit in [employer].  SBP DBP HR  149 83 94  116 80 116  148 82 124  139 84 108  Patient will ask someone to come pick up sample Eliquis from our office. Will collect proof of income and proof of copay spend and get back to me at lab work appointment Friday, as well as completing patient assistance application for Eliquis.  Patient asked if she will be on Eliquis for life. Discussed that this is a conversation for Dr. Nehemiah Massed, but pending complete resolution of Afib or mechanical intervention, we will likely recommend continuation of Eliquis. Follow up with Dr. Nehemiah Massed.  Hyperlipidemia, ASCVD Risk Reduction Controlled per last lipid panel; current treatment: atorvastatin 20 mg daily Antiplatelet therapy: none given concurrent  anticoagulation Previously recommended to continue current regimen at this time  Chronic Obstructive Pulmonary Disease: Controlled; current treatment: Breztri 160/9/4.8 mcg 2 puffs BID; albuterol HFA PRN - 1-2 times daily due to wheezing and SOB; follows w/ Dr. Raul Del [facility] [place] through [facility] patient assistance Previously recommended to continue current regimen at this time along with pulmonology collaboration  Depression/Anxiety/Insomnia/Pain: Exacerbated by pain; follows w/ Dr. Shea Evans. Current regimen: lamotrigine 25 mg daily; duloxetine 60 mg daily, trazodone 200 mg QPM; started on hydroxyzine 25 mg QPM in May with no benefit, started on gabapentin 300 mg QPM earlier this month.  Previously recommended to continue current regimen along with collaboration w/ psychiatry and LCSW.   Pain, s/p recent hip replacement with subsequent fracture Uncontrolled; current treatment: diclofenac 75 mg BID, cyclobenzaprine 5 mg up to TID for pain/muscle spasms, acetaminophen 500 mg BID PRN, tramadol 50 mg BID PRN, gabapentin as above. Follows w/ Tamala Julian, orthopedist Recent script for oxycodone 5 mg 1-2 tablets Q4H s/p hip replacement Per documentation, Dr. Roland Rack recommended aspirin 325 mg for 4 weeks for VTE ppx, however, he was unaware patient needed to be on Eliquis 5 mg BID for Afib. Contacted Dr. Nicholaus Bloom office, left voicemail noting that she is going to be on Eliqius 5 mg BID so does not need to be on ASA 325 mg.   Diarrhea, chronic with GERD: Moderately well controlled per patient report; Current regimen: taking imodium daily from OTC insurance benefits; pantoprazole 40 mg BID with famotidine 40 mg daily, prescribed dicyclomine 10 mg PRN but has not started this yet Previously encouraged to continue to collaborate with GI to determine the best long term plan for management of chronic diarrhea.   Overactive Bladder: Managed per patient report; current regimen: oxybutynin XL 10 mg daily, follows w/  Dr. Bernardo Heater Previously on tolterodine, beneficial at  first but then waned over time Myrbetriq too expensive on her insurance Previously recommended to continue current regimen and collaboration w/ urology  Tobacco Abuse: 1-1.5 packs per day; 50 years of use Previous quit attempts: unsuccessful using varenicline (nightmares) Triggers to smoke: stress, familial stress lately  Previously encouraged to start nicotine patches + gum when ready to quit.   Patient Goals/Self-Care Activities Over the next 90 days, patient will:  - take medications as prescribed collaborate with provider on medication access solutions  Follow Up Plan: Telephone follow up appointment with care management team member scheduled for:~ 6 weeks      Medication Assistance: Breztri obtained through [facility] medication assistance program.  Enrollment ends 11/24/21.   Eliquis application in progress  Patient's preferred pharmacy is:  [facility] - [place], [place] [address] Phone: [phone] Fax: [phone]  [id number] [id number] [id number]: [phone]: [phone]   Follow Up:  Patient agrees to Care Plan and Follow-up.  Plan: Telephone follow up appointment with care management team member scheduled for:  6 weeks  Catie TDarnelle Maffucci PharmD, [place] Mac[place]enzieClinical Pharmacist [facility] [phone]

## 2021-07-04 ENCOUNTER — Telehealth: Payer: Self-pay | Admitting: *Deleted

## 2021-07-04 ENCOUNTER — Ambulatory Visit: Payer: Medicare HMO | Admitting: Pharmacist

## 2021-07-04 DIAGNOSIS — I480 Paroxysmal atrial fibrillation: Secondary | ICD-10-CM

## 2021-07-04 DIAGNOSIS — J449 Chronic obstructive pulmonary disease, unspecified: Secondary | ICD-10-CM

## 2021-07-04 DIAGNOSIS — I25119 Atherosclerotic heart disease of native coronary artery with unspecified angina pectoris: Secondary | ICD-10-CM | POA: Diagnosis not present

## 2021-07-04 DIAGNOSIS — I2510 Atherosclerotic heart disease of native coronary artery without angina pectoris: Secondary | ICD-10-CM

## 2021-07-04 DIAGNOSIS — D649 Anemia, unspecified: Secondary | ICD-10-CM

## 2021-07-04 DIAGNOSIS — I10 Essential (primary) hypertension: Secondary | ICD-10-CM | POA: Diagnosis not present

## 2021-07-04 DIAGNOSIS — E782 Mixed hyperlipidemia: Secondary | ICD-10-CM | POA: Diagnosis not present

## 2021-07-04 DIAGNOSIS — I6523 Occlusion and stenosis of bilateral carotid arteries: Secondary | ICD-10-CM | POA: Diagnosis not present

## 2021-07-04 DIAGNOSIS — E1159 Type 2 diabetes mellitus with other circulatory complications: Secondary | ICD-10-CM

## 2021-07-04 DIAGNOSIS — E7800 Pure hypercholesterolemia, unspecified: Secondary | ICD-10-CM

## 2021-07-04 NOTE — Patient Instructions (Signed)
Visit Information  PATIENT GOALS:  Goals Addressed               This Visit's Progress     Patient Stated     Medication Monitoring (pt-stated)        Patient Goals/Self-Care Activities Over the next 90 days, patient will:  - take medications as prescribed collaborate with provider on medication access solutions         Patient verbalizes understanding of instructions provided today and agrees to view in [place].    Plan: Telephone follow up appointment with care management team member scheduled for:  6 weeks  [name], PharmD, [place], [place] Clinical Pharmacist [employer] [facility] [employer] [phone]

## 2021-07-04 NOTE — Chronic Care Management (AMB) (Signed)
Chronic Care Management Pharmacy Note  07/04/2021 Name:  Kathryn Friedman MRN:  [id number] DOB:  06-21-1956    Subjective: Kathryn Friedman is an 65 y.o. year old female who is a primary patient of Einar Pheasant, MD.  The CCM team was consulted for assistance with disease management and care coordination needs.    Engaged with patient by telephone for  medication access  in response to provider referral for pharmacy case management and/or care coordination services.   Consent to Services:  The patient was given information about Chronic Care Management services, agreed to services, and gave verbal consent prior to initiation of services.  Please see initial visit note for detailed documentation.   Patient Care Team: Einar Pheasant, MD as PCP - General (Internal Medicine) De Hollingshead, RPH-CPP (Pharmacist)   Objective:  Lab Results  Component Value Date   CREATININE 0.69 06/20/2021   CREATININE 1.09 (H) 06/19/2021   CREATININE 0.64 06/13/2021    Lab Results  Component Value Date   HGBA1C 7.1 (H) 03/30/2021   Last diabetic Eye exam:  Lab Results  Component Value Date/Time   HMDIABEYEEXA No Retinopathy 11/06/2020 12:00 AM    Last diabetic Foot exam:  Lab Results  Component Value Date/Time   HMDIABFOOTEX by Dr Cleda Mccreedy - podiatry 12/10/2018 12:00 AM        Component Value Date/Time   CHOL 175 03/30/2021 0909   TRIG 298.0 (H) 03/30/2021 0909   HDL 41.70 03/30/2021 0909   CHOLHDL 4 03/30/2021 0909   VLDL 59.6 (H) 03/30/2021 0909   LDLCALC 53 07/06/2020 0824   LDLDIRECT 103.0 03/30/2021 0909    Hepatic Function Latest Ref Rng & Units 06/06/2021 05/23/2021 03/30/2021  Total Protein 6.5 - 8.1 g/dL 6.2(L) 6.2 6.7  Albumin 3.5 - 5.0 g/dL 3.4(L) 3.9 4.1  AST 15 - 41 U/L '22 20 27  ' ALT 0 - 44 U/L '13 14 26  ' Alk Phosphatase 38 - 126 U/L 54 69 70  Total Bilirubin 0.3 - 1.2 mg/dL 0.7 0.6 0.5  Bilirubin, Direct 0.0 - 0.3 mg/dL - 0.2 0.1    Lab  Results  Component Value Date/Time   TSH 1.56 03/17/2020 09:01 AM   TSH 1.42 12/02/2017 09:32 AM    CBC Latest Ref Rng & Units 06/20/2021 06/19/2021 06/13/2021  WBC 4.0 - 10.5 K/uL 7.3 13.3(H) 14.2(H)  Hemoglobin 12.0 - 15.0 g/dL 11.8(L) 11.6(L) 14.5  Hematocrit 36.0 - 46.0 % 36.4 34.2(L) 43.4  Platelets 150 - 400 K/uL 179 193 169    No results found for: VD25OH  Clinical ASCVD: No  The 10-year ASCVD risk score Mikey Bussing DC Jr., et al., 2013) is: 24.8%   Values used to calculate the score:     Age: 71 years     Sex: Female     Is Non-Hispanic African American: No     Diabetic: Yes     Tobacco smoker: Yes     Systolic Blood Pressure: 353 mmHg     Is BP treated: Yes     HDL Cholesterol: 41.7 mg/dL     Total Cholesterol: 175 mg/dL     Social History   Tobacco Use  Smoking Status Every Day   Packs/day: 1.50   Years: 45.00   Pack years: 67.50   Types: Cigarettes  Smokeless Tobacco Never   BP Readings from Last 3 Encounters:  06/22/21 138/80  06/13/21 116/68  06/06/21 (!) 148/73   Pulse Readings from Last 3 Encounters:  06/22/21 77  06/13/21 71  06/06/21 70   Wt Readings from Last 3 Encounters:  06/20/21 218 lb 14.7 oz (99.3 kg)  06/12/21 213 lb (96.6 kg)  06/06/21 217 lb 13 oz (98.8 kg)    Assessment: Review of patient past medical history, allergies, medications, health status, including review of consultants reports, laboratory and other test data, was performed as part of comprehensive evaluation and provision of chronic care management services.   SDOH:  (Social Determinants of Health) assessments and interventions performed:    CCM Care Plan  Allergies  Allergen Reactions   Varenicline Other (See Comments)    "I got really depressed" "I got really depressed" CHANTEX   Jardiance [Empagliflozin] Other (See Comments)    Yeast infection   Methylprednisolone Nausea Only and Nausea And Vomiting    Medications Reviewed Today     Reviewed by Einar Pheasant,  MD (Physician) on 07/01/21 at 2320  Med List Status: <None>   Medication Order Taking? Sig Documenting Provider Last Dose Status Informant  acetaminophen (TYLENOL) 325 MG tablet [id number] Yes Take 650 mg by mouth every 6 (six) hours as needed. [provider] Taking Active Self  albuterol (VENTOLIN HFA) 108 (90 Base) MCG/ACT inhaler [id number] Yes INHALE 2 PUFFS FOUR TIMES A DAY  Patient taking differently: Inhale 2 puffs into the lungs every 4 (four) hours as needed for wheezing or shortness of breath.   Einar Pheasant, MD Taking Active            Med Note Darnelle Maffucci, Arville Lime   Fri May 11, 2021 10:50 AM) 0-2 times daily  amLODipine (NORVASC) 5 MG tablet [id number] Yes Take 1 tablet (5 mg total) by mouth daily. Einar Pheasant, MD Taking Active Self  apixaban (ELIQUIS) 5 MG TABS tablet [id number] Yes Take 1 tablet (5 mg total) by mouth 2 (two) times daily. Einar Pheasant, MD Taking Active   aspirin 81 MG tablet [id number] Yes Take 81 mg by mouth daily.  [provider] Taking Active Self  Blood Glucose Monitoring Suppl (TRUE METRIX METER) w/Device KIT [id number] Yes  [provider] Taking Active Self  Budeson-Glycopyrrol-Formoterol (BREZTRI AEROSPHERE) 160-9-4.8 MCG/ACT AERO [id number] Yes Inhale 2 puffs into the lungs in the morning and at bedtime. Einar Pheasant, MD Taking Active Self  CALCIUM PO [id number] Yes Take 600 mg by mouth daily.  [provider] Taking Active Self  ciprofloxacin (CIPRO) 500 MG tablet [id number] No Take 500 mg by mouth daily with breakfast.  Patient not taking: Reported on 06/27/2021   [provider] Not Taking Consider Medication Status and Discontinue   cyclobenzaprine (FLEXERIL) 10 MG tablet [id number] Yes TAKE 1 TABLET THREE TIMES DAILY AS NEEDED FOR MUSCLE SPASM(S)  Patient taking differently: Take 10 mg by mouth 3 (three) times daily.   Einar Pheasant, MD Taking Active            Med Note Gentry Roch   Wed May 30, 2021 12:52 PM)    diclofenac (VOLTAREN) 75 MG EC tablet [id number] Yes Take 75 mg by mouth 2 (two) times daily. [provider] Taking Active Self  dicyclomine (BENTYL) 20 MG tablet [id number] Yes Take 20 mg by mouth every 6 (six) hours as needed. [provider] Taking Active Self  DULoxetine (CYMBALTA) 60 MG capsule [id number] Yes Take 1 capsule (60 mg total) by mouth daily. Einar Pheasant, MD Taking Active Self  famotidine (PEPCID) 40 MG tablet [id number] Yes TAKE 1 TABLET (40 MG TOTAL) BY MOUTH DAILY. Einar Pheasant, MD Taking  Active   gabapentin (NEURONTIN) 300 MG capsule [id number]  Take 1 capsule (300 mg total) by mouth at bedtime. Ursula Alert, MD  Active   glucose blood test strip [id number] Yes Use as instructed to check blood sugars twice daily. Dx E11.9. Einar Pheasant, MD Taking Active Self  isosorbide mononitrate (IMDUR) 30 MG 24 hr tablet [id number] Yes TAKE 1 TABLET EVERY DAY  Patient taking differently: Take 30 mg by mouth daily.   Einar Pheasant, MD Taking Active   L-FORMULA LYSINE HCL PO [id number] Yes Take 1,000 mg by mouth daily. [provider] Taking Active Self  lamoTRIgine (LAMICTAL) 25 MG tablet [id number] Yes TAKE 1 TABLET EVERY DAY FOR MOOD  Patient taking differently: 25 mg daily.   Ursula Alert, MD Taking Active   metFORMIN (GLUCOPHAGE-XR) 500 MG 24 hr tablet [id number] Yes Take 2 tablets (1,000 mg total) by mouth in the morning and at bedtime. Einar Pheasant, MD Taking Active Self  Multiple Vitamin (MULTIVITAMIN PO) [id number] Yes Take 1 tablet by mouth daily. [provider] Taking Active Self  mupirocin ointment (BACTROBAN) 2 % [id number] No Apply to affected area on abdomen bid  Patient not taking: No sig reported   Einar Pheasant, MD Not Taking Active Self  nystatin (MYCOSTATIN/NYSTOP) powder [id number] Yes APPLY TO THE AFFECTED AREA(S) TWICE DAILY  Patient taking differently: Apply 1 application topically daily as needed  (Yeast infection).   Einar Pheasant, MD Taking Active   ondansetron [facility]) 4 MG tablet [id number] No Take 1 tablet (4 mg total) by mouth every 6 (six) hours as needed for nausea.  Patient not taking: No sig reported   Lattie Corns, PA-C Not Taking Active Self  oxybutynin (DITROPAN-XL) 10 MG 24 hr tablet [id number] Yes Take 1 tablet (10 mg total) by mouth daily. Abbie Sons, MD Taking Active Self  oxyCODONE (OXY IR/ROXICODONE) 5 MG immediate release tablet [id number] Yes Take 1-2 tablets (5-10 mg total) by mouth every 4 (four) hours as needed for moderate pain (pain score 4-6). Sidney Ace, MD Taking Active   oxymetazoline (AFRIN) 0.05 % nasal spray [id number] Yes Place 1 spray into both nostrils 2 (two) times daily. [provider] Taking Active Self  pantoprazole (PROTONIX) 40 MG tablet [id number] Yes TAKE 1 TABLET TWICE DAILY BEFORE MEALS  Patient taking differently: Take 40 mg by mouth 2 (two) times daily.   Einar Pheasant, MD Taking Active   Potassium 99 MG TABS [id number] Yes Take 99 mg by mouth daily. [provider] Taking Active Self  propranolol (INDERAL) 40 MG tablet [id number] Yes 1 tab daily PO  Patient taking differently: Take 40 mg by mouth daily.   Einar Pheasant, MD Taking Active   rosuvastatin (CRESTOR) 20 MG tablet [id number] Yes Take 1 tablet (20 mg total) by mouth daily. Einar Pheasant, MD Taking Active Self  telmisartan (MICARDIS) 80 MG tablet [id number] Yes Take 1 tablet (80 mg total) by mouth daily. Einar Pheasant, MD Taking Active Self  traZODone (DESYREL) 100 MG tablet [id number] Yes Take 2 tablets (200 mg total) by mouth at bedtime. Einar Pheasant, MD Taking Active Self            Patient Active Problem List   Diagnosis Date Noted   Electrolyte disorder 07/01/2021   Closed nondisplaced fracture of greater trochanter of left femur ([place]) 06/22/2021   Hip fx ([place]) 06/19/2021   Status post total hip replacement, left 06/12/2021    Paroxysmal A-fib ([place]) 06/07/2021   Primary osteoarthritis of left hip  05/18/2021   Moderate episode of recurrent major depressive disorder ([place]) 04/05/2021   Aortic atherosclerosis ([place]) 04/03/2021   SOBOE (shortness of breath on exertion) 01/16/2021   Thrush 12/27/2020   Suspected COVID-19 virus infection 12/20/2020   MDD (major depressive disorder), recurrent episode, mild ([place]) 11/16/2020   Abdominal wall abscess 07/30/2020   Skin lesion 07/23/2020   Boil 07/20/2020   MDD (major depressive disorder), recurrent, in full remission ([place]) 06/26/2020   Insomnia due to medical condition 06/26/2020   Anxiety disorder 06/26/2020   Cough 04/03/2020   Urge incontinence 03/02/2020   Urinary frequency 02/06/2020   Bilateral carotid artery stenosis 10/15/2019   PSVT (paroxysmal supraventricular tachycardia) ([place]) 08/09/2019   History of migraine headaches 05/10/2019   Lower abdominal pain 03/21/2019   Dysphagia 09/15/2018   ILD (interstitial lung disease) ([place]) 08/17/2017   Tobacco use disorder 08/11/2016   Venous insufficiency of both lower extremities 07/10/2016   Healthcare maintenance 06/18/2016   Lump in the abdomen 08/06/2015   Dizziness 08/06/2015   Fatty infiltration of liver 08/11/2014   Blood in the stool 08/11/2014   Acute diarrhea 08/11/2014   Hemorrhage of gastrointestinal tract 08/07/2014   GERD (gastroesophageal reflux disease) 10/20/2013   Iron deficiency anemia 10/20/2013   Nausea with vomiting 10/19/2013   LUQ pain 10/19/2013   Diarrhea 09/26/2013   Anemia 04/12/2013   CAD (coronary artery disease) 02/18/2013   COPD (chronic obstructive pulmonary disease) ([place]) 02/18/2013   Obstructive sleep apnea 02/18/2013   Mild depression ([place]) 02/18/2013   Diverticulosis 02/18/2013   Diabetes ([place]) 02/17/2013   Essential hypertension, benign 02/17/2013   Hypercholesterolemia 02/17/2013    Immunization History  Administered Date(s) Administered   Influenza,inj,Quad  PF,6+ Mos 09/23/2013, 09/09/2014, 08/01/2015, 08/06/2016, 08/14/2017, 08/14/2018, 09/16/2019, 07/13/2020   Influenza-Unspecified 10/24/2011   PFIZER(Purple Top)SARS-COV-2 Vaccination 02/23/2020, 03/15/2020, 02/09/2021   Pneumococcal Polysaccharide-23 09/23/2013   Td 04/21/2018    Conditions to be addressed/monitored: CAD, HTN, HLD, and COPD  Care Plan : General Pharmacy (Adult)  Updates made by De Hollingshead, RPH-CPP since 07/04/2021 12:00 AM     Problem: COPD, CAD, HLD, HTN      Long-Range Goal: Disease Progression Prevention   This Visit's Progress: On track  Recent Progress: On track  Priority: High  Note:   Current Barriers:  Unable to independently afford treatment regimen Complex patient with multiple comorbidities including diabetes, coronary artery disease, COPD, depression/anxiety  Pharmacist Clinical Goal(s):  Over the next 90 days, patient will verbalize ability to afford treatment regimen. Over the next 90 days, patient will adhere to prescribed medication regimen  Interventions: 1:1 collaboration with Einar Pheasant, MD regarding development and update of comprehensive plan of care as evidenced by provider attestation and co-signature Inter-disciplinary care team collaboration (see longitudinal plan of care) Comprehensive medication review performed; medication list updated in electronic medical record  Diabetes: Controlled; current treatment: metformin XR 1000 mg BID Previously recommended to continue current regimen at this time.   Hypertension, Coronary Artery Disease, new diagnosis of AFib Inappropriately managed; current treatment: amlodipine 5 mg daily, isosorbide 30 mg daily, propranolol 40 mg daily, telmisartan 80 mg daily; follows w/ Dr. Nehemiah Massed. Anticoagulation: Eliquis 5 mg BID Per Care Everywhere, Holter from 01/2021 interpreted by Dr. Nehemiah Massed showed atrial fibrillation.  Collaborating with patient on patient assistance application for  Eliquis. Received total copay spend from [employer]. Called [employer], requested they fax spend report to me. Called [facility], asked that they fax spend report to me. [place]. The can  mail a report to patient, but cannot fax to me. They suggested that patient can go online to her [employer] account or [place] account and download the report. Talked with patient about this. She will work on sending this to me.  Hyperlipidemia, ASCVD Risk Reduction Controlled per last lipid panel; current treatment: atorvastatin 20 mg daily Antiplatelet therapy: none given concurrent anticoagulation Previously recommended to continue current regimen at this time  Chronic Obstructive Pulmonary Disease: Controlled; current treatment: Breztri 160/9/4.8 mcg 2 puffs BID; albuterol HFA PRN - 1-2 times daily due to wheezing and SOB; follows w/ Dr. Raul Del [facility] [place] through [facility] patient assistance Previously recommended to continue current regimen at this time along with pulmonology collaboration  Depression/Anxiety/Insomnia/Pain: Exacerbated by pain; follows w/ Dr. Shea Evans. Current regimen: lamotrigine 25 mg daily; duloxetine 60 mg daily, trazodone 200 mg QPM; started on hydroxyzine 25 mg QPM in May with no benefit, started on gabapentin 300 mg QPM earlier this month.  Previously recommended to continue current regimen along with collaboration w/ psychiatry and LCSW.   Pain, s/p recent hip replacement with subsequent fracture Uncontrolled; current treatment: diclofenac 75 mg BID, cyclobenzaprine 5 mg up to TID for pain/muscle spasms, acetaminophen 500 mg BID PRN, tramadol 50 mg BID PRN, gabapentin as above. Follows w/ Tamala Julian, orthopedist Recent script for oxycodone 5 mg 1-2 tablets Q4H s/p hip replacement   Diarrhea, chronic with GERD: Moderately well controlled per patient report; Current regimen: taking imodium daily from OTC insurance benefits; pantoprazole 40 mg BID with famotidine 40 mg daily,  prescribed dicyclomine 10 mg PRN but has not started this yet Previously encouraged to continue to collaborate with GI to determine the best long term plan for management of chronic diarrhea.   Overactive Bladder: Managed per patient report; current regimen: oxybutynin XL 10 mg daily, follows w/ Dr. Bernardo Heater Previously on tolterodine, beneficial at first but then waned over time Myrbetriq too expensive on her insurance Previously recommended to continue current regimen and collaboration w/ urology  Tobacco Abuse: 1-1.5 packs per day; 50 years of use Previous quit attempts: unsuccessful using varenicline (nightmares) Triggers to smoke: stress, familial stress lately  Previously encouraged to start nicotine patches + gum when ready to quit.   Patient Goals/Self-Care Activities Over the next 90 days, patient will:  - take medications as prescribed collaborate with provider on medication access solutions  Follow Up Plan: Telephone follow up appointment with care management team member scheduled for:~ 6 weeks      Medication Assistance:  Breztri obtained through [facility] medication assistance program.  Enrollment ends 11/24/21. Eliquis PAP in process  Patient's preferred pharmacy is:  [facility] - [address], [place] [address] Phone: [phone] Fax: [phone]  [id number]: [phone]: [phone]  Follow Up:  Patient agrees to Care Plan and Follow-up.  Plan: Telephone follow up appointment with care management team member scheduled for:  6 weeks  Catie TDarnelle Maffucci PharmD, BTaylor CErathClinical Pharmacist LOccidental Petroleumat BJohnson & [phone]

## 2021-07-04 NOTE — Telephone Encounter (Signed)
Please place future orders for lab appt.  

## 2021-07-05 ENCOUNTER — Ambulatory Visit: Payer: Medicare HMO | Admitting: Pharmacist

## 2021-07-05 DIAGNOSIS — I10 Essential (primary) hypertension: Secondary | ICD-10-CM

## 2021-07-05 DIAGNOSIS — J449 Chronic obstructive pulmonary disease, unspecified: Secondary | ICD-10-CM

## 2021-07-05 DIAGNOSIS — E1159 Type 2 diabetes mellitus with other circulatory complications: Secondary | ICD-10-CM

## 2021-07-05 DIAGNOSIS — I2510 Atherosclerotic heart disease of native coronary artery without angina pectoris: Secondary | ICD-10-CM

## 2021-07-05 DIAGNOSIS — I480 Paroxysmal atrial fibrillation: Secondary | ICD-10-CM

## 2021-07-05 NOTE — Telephone Encounter (Signed)
Orders placed for lab orders.

## 2021-07-05 NOTE — Patient Instructions (Signed)
Visit Information  PATIENT GOALS:  Goals Addressed               This Visit's Progress     Patient Stated     Medication Monitoring (pt-stated)        Patient Goals/Self-Care Activities Over the next 90 days, patient will:  - take medications as prescribed collaborate with provider on medication access solutions        Patient verbalizes understanding of instructions provided today and agrees to view in [place].   Plan: Telephone follow up appointment with care management team member scheduled for:  ~ 6 weeks  [name], PharmD, [place], [place] Clinical Pharmacist [employer] at [employer] [phone]

## 2021-07-05 NOTE — Chronic Care Management (AMB) (Signed)
Chronic Care Management Pharmacy Note  07/05/2021 Name:  Kathryn Friedman MRN:  [id number] DOB:  07-10-1956   Subjective: Kathryn Friedman Kathryn Friedman is an 65 y.o. year old female who is a primary patient of Einar Pheasant, MD.  The CCM team was consulted for assistance with disease management and care coordination needs.    Engaged with patient by telephone for  medication access  in response to provider referral for pharmacy case management and/or care coordination services.   Consent to Services:  The patient was given information about Chronic Care Management services, agreed to services, and gave verbal consent prior to initiation of services.  Please see initial visit note for detailed documentation.   Patient Care Team: Einar Pheasant, MD as PCP - General (Internal Medicine) De Hollingshead, RPH-CPP (Pharmacist)   Objective:  Lab Results  Component Value Date   CREATININE 0.69 06/20/2021   CREATININE 1.09 (H) 06/19/2021   CREATININE 0.64 06/13/2021    Lab Results  Component Value Date   HGBA1C 7.1 (H) 03/30/2021   Last diabetic Eye exam:  Lab Results  Component Value Date/Time   HMDIABEYEEXA No Retinopathy 11/06/2020 12:00 AM    Last diabetic Foot exam:  Lab Results  Component Value Date/Time   HMDIABFOOTEX by Dr Cleda Mccreedy - podiatry 12/10/2018 12:00 AM        Component Value Date/Time   CHOL 175 03/30/2021 0909   TRIG 298.0 (H) 03/30/2021 0909   HDL 41.70 03/30/2021 0909   CHOLHDL 4 03/30/2021 0909   VLDL 59.6 (H) 03/30/2021 0909   LDLCALC 53 07/06/2020 0824   LDLDIRECT 103.0 03/30/2021 0909    Hepatic Function Latest Ref Rng & Units 06/06/2021 05/23/2021 03/30/2021  Total Protein 6.5 - 8.1 g/dL 6.2(L) 6.2 6.7  Albumin 3.5 - 5.0 g/dL 3.4(L) 3.9 4.1  AST 15 - 41 U/L '22 20 27  ' ALT 0 - 44 U/L '13 14 26  ' Alk Phosphatase 38 - 126 U/L 54 69 70  Total Bilirubin 0.3 - 1.2 mg/dL 0.7 0.6 0.5  Bilirubin, Direct 0.0 - 0.3 mg/dL - 0.2 0.1    Lab  Results  Component Value Date/Time   TSH 1.56 03/17/2020 09:01 AM   TSH 1.42 12/02/2017 09:32 AM    CBC Latest Ref Rng & Units 06/20/2021 06/19/2021 06/13/2021  WBC 4.0 - 10.5 K/uL 7.3 13.3(H) 14.2(H)  Hemoglobin 12.0 - 15.0 g/dL 11.8(L) 11.6(L) 14.5  Hematocrit 36.0 - 46.0 % 36.4 34.2(L) 43.4  Platelets 150 - 400 K/uL 179 193 169    No results found for: VD25OH  Clinical ASCVD: Yes  The 10-year ASCVD risk score Mikey Bussing DC Jr., et al., 2013) is: 26.1%   Values used to calculate the score:     Age: 62 years     Sex: Female     Is Non-Hispanic African American: No     Diabetic: Yes     Tobacco smoker: Yes     Systolic Blood Pressure: 562 mmHg     Is BP treated: Yes     HDL Cholesterol: 41.7 mg/dL     Total Cholesterol: 175 mg/dL     CHA2DS2-VASc Score = 5  This indicates a 7.2% annual risk of stroke. The patient's score is based upon: CHF History: No HTN History: Yes Diabetes History: Yes Stroke History: No Vascular Disease History: Yes Age Score: 1 Gender Score: 1     Social History   Tobacco Use  Smoking Status Every Day   Packs/day: 1.50  Years: 45.00   Pack years: 67.50   Types: Cigarettes  Smokeless Tobacco Never   BP Readings from Last 3 Encounters:  06/22/21 138/80  06/13/21 116/68  06/06/21 (!) 148/73   Pulse Readings from Last 3 Encounters:  06/22/21 77  06/13/21 71  06/06/21 70   Wt Readings from Last 3 Encounters:  06/20/21 218 lb 14.7 oz (99.3 kg)  06/12/21 213 lb (96.6 kg)  06/06/21 217 lb 13 oz (98.8 kg)    Assessment: Review of patient past medical history, allergies, medications, health status, including review of consultants reports, laboratory and other test data, was performed as part of comprehensive evaluation and provision of chronic care management services.   SDOH:  (Social Determinants of Health) assessments and interventions performed:  SDOH Interventions    Flowsheet Row Most Recent Value  SDOH Interventions   Financial  Strain Interventions Other (Comment)  [manufacturer assistance]       CCM Care Plan  Allergies  Allergen Reactions   Varenicline Other (See Comments)    "I got really depressed" "I got really depressed" CHANTEX   Jardiance [Empagliflozin] Other (See Comments)    Yeast infection   Methylprednisolone Nausea Only and Nausea And Vomiting    Medications Reviewed Today     Reviewed by Einar Pheasant, MD (Physician) on 07/01/21 at 2320  Med List Status: <None>   Medication Order Taking? Sig Documenting Provider Last Dose Status Informant  acetaminophen (TYLENOL) 325 MG tablet [id number] Yes Take 650 mg by mouth every 6 (six) hours as needed. [provider] Taking Active Self  albuterol (VENTOLIN HFA) 108 (90 Base) MCG/ACT inhaler [id number] Yes INHALE 2 PUFFS FOUR TIMES A DAY  Patient taking differently: Inhale 2 puffs into the lungs every 4 (four) hours as needed for wheezing or shortness of breath.   Einar Pheasant, MD Taking Active            Med Note Darnelle Maffucci, Arville Lime   Fri May 11, 2021 10:50 AM) 0-2 times daily  amLODipine (NORVASC) 5 MG tablet [id number] Yes Take 1 tablet (5 mg total) by mouth daily. Einar Pheasant, MD Taking Active Self  apixaban (ELIQUIS) 5 MG TABS tablet [id number] Yes Take 1 tablet (5 mg total) by mouth 2 (two) times daily. Einar Pheasant, MD Taking Active   aspirin 81 MG tablet [id number] Yes Take 81 mg by mouth daily.  [provider] Taking Active Self  Blood Glucose Monitoring Suppl (TRUE METRIX METER) w/Device KIT [id number] Yes  [provider] Taking Active Self  Budeson-Glycopyrrol-Formoterol (BREZTRI AEROSPHERE) 160-9-4.8 MCG/ACT AERO [id number] Yes Inhale 2 puffs into the lungs in the morning and at bedtime. Einar Pheasant, MD Taking Active Self  CALCIUM PO [id number] Yes Take 600 mg by mouth daily.  [provider] Taking Active Self  ciprofloxacin (CIPRO) 500 MG tablet [id number] No Take 500 mg by mouth daily with  breakfast.  Patient not taking: Reported on 06/27/2021   [provider] Not Taking Consider Medication Status and Discontinue   cyclobenzaprine (FLEXERIL) 10 MG tablet [id number] Yes TAKE 1 TABLET THREE TIMES DAILY AS NEEDED FOR MUSCLE SPASM(S)  Patient taking differently: Take 10 mg by mouth 3 (three) times daily.   Einar Pheasant, MD Taking Active            Med Note Gentry Roch   Wed May 30, 2021 12:52 PM)    diclofenac (VOLTAREN) 75 MG EC tablet [id number] Yes Take 75 mg by mouth 2 (two) times daily. [provider]  Taking Active Self  dicyclomine (BENTYL) 20 MG tablet [id number] Yes Take 20 mg by mouth every 6 (six) hours as needed. [provider] Taking Active Self  DULoxetine (CYMBALTA) 60 MG capsule [id number] Yes Take 1 capsule (60 mg total) by mouth daily. Einar Pheasant, MD Taking Active Self  famotidine (PEPCID) 40 MG tablet [id number] Yes TAKE 1 TABLET (40 MG TOTAL) BY MOUTH DAILY. Einar Pheasant, MD Taking Active   gabapentin (NEURONTIN) 300 MG capsule [id number]  Take 1 capsule (300 mg total) by mouth at bedtime. Ursula Alert, MD  Active   glucose blood test strip [id number] Yes Use as instructed to check blood sugars twice daily. Dx E11.9. Einar Pheasant, MD Taking Active Self  isosorbide mononitrate (IMDUR) 30 MG 24 hr tablet [id number] Yes TAKE 1 TABLET EVERY DAY  Patient taking differently: Take 30 mg by mouth daily.   Einar Pheasant, MD Taking Active   L-FORMULA LYSINE HCL PO [id number] Yes Take 1,000 mg by mouth daily. [provider] Taking Active Self  lamoTRIgine (LAMICTAL) 25 MG tablet [id number] Yes TAKE 1 TABLET EVERY DAY FOR MOOD  Patient taking differently: 25 mg daily.   Ursula Alert, MD Taking Active   metFORMIN (GLUCOPHAGE-XR) 500 MG 24 hr tablet [id number] Yes Take 2 tablets (1,000 mg total) by mouth in the morning and at bedtime. Einar Pheasant, MD Taking Active Self  Multiple Vitamin (MULTIVITAMIN PO) [id number] Yes  Take 1 tablet by mouth daily. [provider] Taking Active Self  mupirocin ointment (BACTROBAN) 2 % [id number] No Apply to affected area on abdomen bid  Patient not taking: No sig reported   Einar Pheasant, MD Not Taking Active Self  nystatin (MYCOSTATIN/NYSTOP) powder [id number] Yes APPLY TO THE AFFECTED AREA(S) TWICE DAILY  Patient taking differently: Apply 1 application topically daily as needed (Yeast infection).   Einar Pheasant, MD Taking Active   ondansetron [facility]) 4 MG tablet [id number] No Take 1 tablet (4 mg total) by mouth every 6 (six) hours as needed for nausea.  Patient not taking: No sig reported   Lattie Corns, PA-C Not Taking Active Self  oxybutynin (DITROPAN-XL) 10 MG 24 hr tablet [id number] Yes Take 1 tablet (10 mg total) by mouth daily. Abbie Sons, MD Taking Active Self  oxyCODONE (OXY IR/ROXICODONE) 5 MG immediate release tablet [id number] Yes Take 1-2 tablets (5-10 mg total) by mouth every 4 (four) hours as needed for moderate pain (pain score 4-6). Sidney Ace, MD Taking Active   oxymetazoline (AFRIN) 0.05 % nasal spray [id number] Yes Place 1 spray into both nostrils 2 (two) times daily. [provider] Taking Active Self  pantoprazole (PROTONIX) 40 MG tablet [id number] Yes TAKE 1 TABLET TWICE DAILY BEFORE MEALS  Patient taking differently: Take 40 mg by mouth 2 (two) times daily.   Einar Pheasant, MD Taking Active   Potassium 99 MG TABS [id number] Yes Take 99 mg by mouth daily. [provider] Taking Active Self  propranolol (INDERAL) 40 MG tablet [id number] Yes 1 tab daily PO  Patient taking differently: Take 40 mg by mouth daily.   Einar Pheasant, MD Taking Active   rosuvastatin (CRESTOR) 20 MG tablet [id number] Yes Take 1 tablet (20 mg total) by mouth daily. Einar Pheasant, MD Taking Active Self  telmisartan (MICARDIS) 80 MG tablet [id number] Yes Take 1 tablet (80 mg total) by mouth daily. Einar Pheasant, MD Taking Active  Self  traZODone (DESYREL) 100 MG tablet [id number] Yes Take 2 tablets (200 mg total) by mouth at bedtime.  Einar Pheasant, MD Taking Active Self            Patient Active Problem List   Diagnosis Date Noted   Electrolyte disorder 07/01/2021   Closed nondisplaced fracture of greater trochanter of left femur ([place]) 06/22/2021   Hip fx ([place]) 06/19/2021   Status post total hip replacement, left 06/12/2021   Paroxysmal A-fib ([place]) 06/07/2021   Primary osteoarthritis of left hip 05/18/2021   Moderate episode of recurrent major depressive disorder ([place]) 04/05/2021   Aortic atherosclerosis ([place]) 04/03/2021   SOBOE (shortness of breath on exertion) 01/16/2021   Thrush 12/27/2020   Suspected COVID-19 virus infection 12/20/2020   MDD (major depressive disorder), recurrent episode, mild ([place]) 11/16/2020   Abdominal wall abscess 07/30/2020   Skin lesion 07/23/2020   Boil 07/20/2020   MDD (major depressive disorder), recurrent, in full remission ([place]) 06/26/2020   Insomnia due to medical condition 06/26/2020   Anxiety disorder 06/26/2020   Cough 04/03/2020   Urge incontinence 03/02/2020   Urinary frequency 02/06/2020   Bilateral carotid artery stenosis 10/15/2019   PSVT (paroxysmal supraventricular tachycardia) ([place]) 08/09/2019   History of migraine headaches 05/10/2019   Lower abdominal pain 03/21/2019   Dysphagia 09/15/2018   ILD (interstitial lung disease) ([place]) 08/17/2017   Tobacco use disorder 08/11/2016   Venous insufficiency of both lower extremities 07/10/2016   Healthcare maintenance 06/18/2016   Lump in the abdomen 08/06/2015   Dizziness 08/06/2015   Fatty infiltration of liver 08/11/2014   Blood in the stool 08/11/2014   Acute diarrhea 08/11/2014   Hemorrhage of gastrointestinal tract 08/07/2014   GERD (gastroesophageal reflux disease) 10/20/2013   Iron deficiency anemia 10/20/2013   Nausea with vomiting 10/19/2013   LUQ pain 10/19/2013   Diarrhea 09/26/2013   Anemia  04/12/2013   CAD (coronary artery disease) 02/18/2013   COPD (chronic obstructive pulmonary disease) ([place]) 02/18/2013   Obstructive sleep apnea 02/18/2013   Mild depression ([place]) 02/18/2013   Diverticulosis 02/18/2013   Diabetes ([place]) 02/17/2013   Essential hypertension, benign 02/17/2013   Hypercholesterolemia 02/17/2013    Immunization History  Administered Date(s) Administered   Influenza,inj,Quad PF,6+ Mos 09/23/2013, 09/09/2014, 08/01/2015, 08/06/2016, 08/14/2017, 08/14/2018, 09/16/2019, 07/13/2020   Influenza-Unspecified 10/24/2011   PFIZER(Purple Top)SARS-COV-2 Vaccination 02/23/2020, 03/15/2020, 02/09/2021   Pneumococcal Polysaccharide-23 09/23/2013   Td 04/21/2018    Conditions to be addressed/monitored: Atrial Fibrillation, CAD, HTN, HLD, and DMII  Care Plan : General Pharmacy (Adult)  Updates made by De Hollingshead, RPH-CPP since 07/05/2021 12:00 AM     Problem: COPD, CAD, HLD, HTN      Long-Range Goal: Disease Progression Prevention   This Visit's Progress: On track  Recent Progress: On track  Priority: High  Note:   Current Barriers:  Unable to independently afford treatment regimen Complex patient with multiple comorbidities including diabetes, coronary artery disease, COPD, depression/anxiety  Pharmacist Clinical Goal(s):  Over the next 90 days, patient will verbalize ability to afford treatment regimen. Over the next 90 days, patient will adhere to prescribed medication regimen  Interventions: 1:1 collaboration with Einar Pheasant, MD regarding development and update of comprehensive plan of care as evidenced by provider attestation and co-signature Inter-disciplinary care team collaboration (see longitudinal plan of care) Comprehensive medication review performed; medication list updated in electronic medical record  Diabetes: Controlled; current treatment: metformin XR 1000 mg BID Previously recommended to continue current regimen at this time.    Hypertension, Coronary Artery Disease, new diagnosis of AFib Appropriately managed, follows w/ Dr. Nehemiah Massed current treatment:  Rate control: propranolol 40 mg daily  Anticoagulation: Eliquis 5 mg BID Additional antihypertensives/antianginals: amlodipine 5 mg daily, isosorbide 30 mg daily, telmisartan 80 mg daily Per Care Everywhere, Holter from 01/2021 interpreted by Dr. Nehemiah Massed showed atrial fibrillation.  Reports she saw Dr. Nehemiah Massed yesterday. Was disappointed, did not feel like her symptoms were believed or taken seriously. He recommended to be on Eliquis 5 mg BID. Vitals this morning were 135/95, HR 50. Encouraged patient to continue to monitor vitals, any symptoms of palpitations, headaches, chest pain, and discuss with Dr. Nicki Reaper at appointment next week.  Collaborating with patient on patient assistance application for Eliquis. Patient notes that she is waiting on total out of pocket spend from [place] to arrive in the mail.  She is coming to clinic for labs tomorrow. She will pick up Eliquis sample and complete the patient portion of the Eliquis patient assistance application. Educated on necessity of anticoagulant therapy in the setting of atrial fibrillation.   Hyperlipidemia, ASCVD Risk Reduction Controlled per last lipid panel; current treatment: atorvastatin 20 mg daily Antiplatelet therapy: none given concurrent anticoagulation Previously recommended to continue current regimen at this time  Chronic Obstructive Pulmonary Disease: Controlled; current treatment: Breztri 160/9/4.8 mcg 2 puffs BID; albuterol HFA PRN - 1-2 times daily due to wheezing and SOB; follows w/ Dr. Raul Del [facility] [place] through [facility] patient assistance Previously recommended to continue current regimen at this time along with pulmonology collaboration  Depression/Anxiety/Insomnia/Pain: Exacerbated by pain; follows w/ Dr. Shea Evans. Current regimen: lamotrigine 25 mg daily; duloxetine 60 mg daily,  trazodone 200 mg QPM; started on hydroxyzine 25 mg QPM in May with no benefit, started on gabapentin 300 mg QPM earlier this month.  Previously recommended to continue current regimen along with collaboration w/ psychiatry and LCSW.   Pain, s/p recent hip replacement with subsequent fracture Uncontrolled; current treatment: diclofenac 75 mg BID, cyclobenzaprine 5 mg up to TID for pain/muscle spasms, acetaminophen 500 mg BID PRN, tramadol 50 mg BID PRN, gabapentin as above. Follows w/ Tamala Julian, orthopedist Recent script for oxycodone 5 mg 1-2 tablets Q4H s/p hip replacement   Diarrhea, chronic with GERD: Moderately well controlled per patient report; Current regimen: taking imodium daily from OTC insurance benefits; pantoprazole 40 mg BID with famotidine 40 mg daily, prescribed dicyclomine 10 mg PRN but has not started this yet Previously encouraged to continue to collaborate with GI to determine the best long term plan for management of chronic diarrhea.   Overactive Bladder: Managed per patient report; current regimen: oxybutynin XL 10 mg daily, follows w/ Dr. Bernardo Heater Previously on tolterodine, beneficial at first but then waned over time Myrbetriq too expensive on her insurance Previously recommended to continue current regimen and collaboration w/ urology  Tobacco Abuse: 1-1.5 packs per day; 50 years of use Previous quit attempts: unsuccessful using varenicline (nightmares) Triggers to smoke: stress, familial stress lately  Previously encouraged to start nicotine patches + gum when ready to quit.   Patient Goals/Self-Care Activities Over the next 90 days, patient will:  - take medications as prescribed collaborate with provider on medication access solutions  Follow Up Plan: Telephone follow up appointment with care management team member scheduled for:~ 6 weeks      Medication Assistance:  Breztri obtained through [facility] medication assistance program.  Enrollment ends 11/24/21.  Eliquis  application in progress  Patient's preferred pharmacy is:  [facility] - [place], [address]  [place] [address] Phone: [phone] Fax: [phone]  [id number] [place] [id number]: [phone]: [phone]  Follow Up:  Patient agrees to Care Plan and Follow-up.  Plan: Telephone follow up appointment with care management team member scheduled for:  ~ 6 weeks  Catie TDarnelle Maffucci PharmD, BBacliff CLa JaraClinical Pharmacist LOccidental Petroleumat BJohnson & [phone]

## 2021-07-06 ENCOUNTER — Other Ambulatory Visit (INDEPENDENT_AMBULATORY_CARE_PROVIDER_SITE_OTHER): Payer: Medicare HMO

## 2021-07-06 ENCOUNTER — Encounter: Payer: Self-pay | Admitting: Internal Medicine

## 2021-07-06 ENCOUNTER — Other Ambulatory Visit: Payer: Self-pay

## 2021-07-06 DIAGNOSIS — E7800 Pure hypercholesterolemia, unspecified: Secondary | ICD-10-CM

## 2021-07-06 DIAGNOSIS — E1159 Type 2 diabetes mellitus with other circulatory complications: Secondary | ICD-10-CM

## 2021-07-06 DIAGNOSIS — D649 Anemia, unspecified: Secondary | ICD-10-CM

## 2021-07-06 LAB — LIPID PANEL
Cholesterol: 102 mg/dL (ref 0–200)
HDL: 42.5 mg/dL (ref >39.00)
LDL Cholesterol: 41 mg/dL (ref 0–99)
NonHDL: 59.06
Total CHOL/HDL Ratio: 2
Triglycerides: 89 mg/dL (ref 0.0–149.0)
VLDL: 17.8 mg/dL (ref 0.0–40.0)

## 2021-07-06 LAB — CBC WITH DIFFERENTIAL/PLATELET
Basophils Absolute: 0.1 10*3/uL (ref 0.0–0.1)
Basophils Relative: 1.3 % (ref 0.0–3.0)
Eosinophils Absolute: 0.3 10*3/uL (ref 0.0–0.7)
Eosinophils Relative: 2.9 % (ref 0.0–5.0)
HCT: 38.6 % (ref 36.0–46.0)
Hemoglobin: 12.8 g/dL (ref 12.0–15.0)
Lymphocytes Relative: 20 % (ref 12.0–46.0)
Lymphs Abs: 1.9 10*3/uL (ref 0.7–4.0)
MCHC: 33.1 g/dL (ref 30.0–36.0)
MCV: 95 fl (ref 78.0–100.0)
Monocytes Absolute: 0.4 10*3/uL (ref 0.1–1.0)
Monocytes Relative: 4.1 % (ref 3.0–12.0)
Neutro Abs: 6.8 10*3/uL (ref 1.4–7.7)
Neutrophils Relative %: 71.7 % (ref 43.0–77.0)
Platelets: 236 10*3/uL (ref 150.0–400.0)
RBC: 4.06 Mil/uL (ref 3.87–5.11)
RDW: 15.7 % — ABNORMAL HIGH (ref 11.5–15.5)
WBC: 9.5 10*3/uL (ref 4.0–10.5)

## 2021-07-06 LAB — HEPATIC FUNCTION PANEL
ALT: 11 U/L (ref 0–35)
AST: 18 U/L (ref 0–37)
Albumin: 3.5 g/dL (ref 3.5–5.2)
Alkaline Phosphatase: 89 U/L (ref 39–117)
Bilirubin, Direct: 0.1 mg/dL (ref 0.0–0.3)
Total Bilirubin: 0.5 mg/dL (ref 0.2–1.2)
Total Protein: 5.9 g/dL — ABNORMAL LOW (ref 6.0–8.3)

## 2021-07-06 LAB — BASIC METABOLIC PANEL
BUN: 9 mg/dL (ref 6–23)
CO2: 25 mEq/L (ref 19–32)
Calcium: 8.6 mg/dL (ref 8.4–10.5)
Chloride: 104 mEq/L (ref 96–112)
Creatinine, Ser: 0.85 mg/dL (ref 0.40–1.20)
GFR: 71.94 mL/min (ref >60.00)
Glucose, Bld: 83 mg/dL (ref 70–99)
Potassium: 4.4 mEq/L (ref 3.5–5.1)
Sodium: 139 mEq/L (ref 135–145)

## 2021-07-06 LAB — MICROALBUMIN / CREATININE URINE RATIO
Creatinine,U: 38.7 mg/dL
Microalb Creat Ratio: 1.8 mg/g (ref 0.0–30.0)
Microalb, Ur: <0.7 mg/dL (ref 0.0–1.9)

## 2021-07-06 LAB — HEMOGLOBIN A1C: Hgb A1c MFr Bld: 6.4 % (ref 4.6–6.5)

## 2021-07-06 LAB — TSH: TSH: 0.87 u[IU]/mL (ref 0.35–5.50)

## 2021-07-07 ENCOUNTER — Encounter: Payer: Self-pay | Admitting: Internal Medicine

## 2021-07-10 ENCOUNTER — Telehealth (INDEPENDENT_AMBULATORY_CARE_PROVIDER_SITE_OTHER): Payer: Medicare HMO | Admitting: Internal Medicine

## 2021-07-10 ENCOUNTER — Encounter: Payer: Self-pay | Admitting: Internal Medicine

## 2021-07-10 DIAGNOSIS — K219 Gastro-esophageal reflux disease without esophagitis: Secondary | ICD-10-CM

## 2021-07-10 DIAGNOSIS — J449 Chronic obstructive pulmonary disease, unspecified: Secondary | ICD-10-CM | POA: Diagnosis not present

## 2021-07-10 DIAGNOSIS — J849 Interstitial pulmonary disease, unspecified: Secondary | ICD-10-CM | POA: Diagnosis not present

## 2021-07-10 DIAGNOSIS — I700 Atherosclerosis of aorta: Secondary | ICD-10-CM | POA: Diagnosis not present

## 2021-07-10 DIAGNOSIS — I480 Paroxysmal atrial fibrillation: Secondary | ICD-10-CM

## 2021-07-10 DIAGNOSIS — E1159 Type 2 diabetes mellitus with other circulatory complications: Secondary | ICD-10-CM

## 2021-07-10 DIAGNOSIS — F32A Depression, unspecified: Secondary | ICD-10-CM

## 2021-07-10 DIAGNOSIS — F320 Major depressive disorder, single episode, mild: Secondary | ICD-10-CM

## 2021-07-10 DIAGNOSIS — G4733 Obstructive sleep apnea (adult) (pediatric): Secondary | ICD-10-CM

## 2021-07-10 DIAGNOSIS — I2510 Atherosclerotic heart disease of native coronary artery without angina pectoris: Secondary | ICD-10-CM

## 2021-07-10 DIAGNOSIS — I10 Essential (primary) hypertension: Secondary | ICD-10-CM | POA: Diagnosis not present

## 2021-07-10 DIAGNOSIS — E7800 Pure hypercholesterolemia, unspecified: Secondary | ICD-10-CM

## 2021-07-10 DIAGNOSIS — Z20822 Contact with and (suspected) exposure to covid-19: Secondary | ICD-10-CM
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
## 2021-07-12 ENCOUNTER — Telehealth: Payer: Self-pay | Admitting: *Deleted

## 2021-07-12 MED ORDER — OXYBUTYNIN CHLORIDE ER 15 MG PO TB24: 15.0000 mg | ORAL_TABLET | Freq: Every day | ORAL | 0 refills | Status: DC

## 2021-07-12 NOTE — Telephone Encounter (Signed)
Pt calling asking if she can take oxybutynin twice daily? Pt states her OAB is getting worse and she's going thru more adult diapers. Please advise

## 2021-07-12 NOTE — Telephone Encounter (Signed)
Oxybutynin is a once daily medication.  Can increase the dose and send in Rx for oxybutynin XL 15 mg daily #30.  If no improvement recommend follow-up appointment with Dr. Matilde Sprang for evaluation of second line options

## 2021-07-12 NOTE — Telephone Encounter (Signed)
Notified patient as instructed, patient pleased. Discussed follow-up appointments, patient agrees  

## 2021-07-14 DIAGNOSIS — F419 Anxiety disorder, unspecified: Secondary | ICD-10-CM | POA: Diagnosis not present

## 2021-07-14 DIAGNOSIS — D509 Iron deficiency anemia, unspecified: Secondary | ICD-10-CM | POA: Diagnosis not present

## 2021-07-14 DIAGNOSIS — Z471 Aftercare following joint replacement surgery: Secondary | ICD-10-CM | POA: Diagnosis not present

## 2021-07-14 DIAGNOSIS — J449 Chronic obstructive pulmonary disease, unspecified: Secondary | ICD-10-CM | POA: Diagnosis not present

## 2021-07-14 DIAGNOSIS — E1151 Type 2 diabetes mellitus with diabetic peripheral angiopathy without gangrene: Secondary | ICD-10-CM | POA: Diagnosis not present

## 2021-07-14 DIAGNOSIS — F329 Major depressive disorder, single episode, unspecified: Secondary | ICD-10-CM | POA: Diagnosis not present

## 2021-07-14 DIAGNOSIS — I872 Venous insufficiency (chronic) (peripheral): Secondary | ICD-10-CM | POA: Diagnosis not present

## 2021-07-14 DIAGNOSIS — I10 Essential (primary) hypertension: Secondary | ICD-10-CM | POA: Diagnosis not present

## 2021-07-14 DIAGNOSIS — I2510 Atherosclerotic heart disease of native coronary artery without angina pectoris: Secondary | ICD-10-CM | POA: Diagnosis not present

## 2021-07-15 ENCOUNTER — Encounter: Payer: Self-pay | Admitting: Internal Medicine

## 2021-07-15 DIAGNOSIS — Z20822 Contact with and (suspected) exposure to covid-19: Secondary | ICD-10-CM | POA: Insufficient documentation

## 2021-07-15 NOTE — Assessment & Plan Note (Signed)
Continue lipitor  ?

## 2021-07-15 NOTE — Assessment & Plan Note (Signed)
S/p stent placement.  Continue lipitor. Followed by cardiology.  On eliquis.    

## 2021-07-15 NOTE — Assessment & Plan Note (Signed)
Continue symbicort.  Breathing stable.  No increased cough or congestion.

## 2021-07-15 NOTE — Assessment & Plan Note (Signed)
Continue symbicort.  Breathing stable.  

## 2021-07-15 NOTE — Assessment & Plan Note (Signed)
CPAP.  

## 2021-07-15 NOTE — Assessment & Plan Note (Signed)
On crestor.  Low cholesterol diet and exercise.  Follow lipid panel and liver function tests.   

## 2021-07-15 NOTE — Assessment & Plan Note (Signed)
Found to have afib.  On eliquis.  No increased heart rate or palpitations.  Follow.

## 2021-07-15 NOTE — Assessment & Plan Note (Signed)
Low carb diet and exercise.  On metformin.  Follow met b and a1c.

## 2021-07-15 NOTE — Assessment & Plan Note (Signed)
Neighbor covid positive.  Has been in contact with her everyday for the last several days.  covid test today and recheck in a couple of days.  Quarantine until confirm negative.  Follow.  Call with test results and call if develops symptoms.

## 2021-07-15 NOTE — Assessment & Plan Note (Signed)
No upper symptoms reported.  On protonix.   

## 2021-07-15 NOTE — Assessment & Plan Note (Signed)
Followed by psychiatry.  Doing well on current medication regimen.  Follow.

## 2021-07-15 NOTE — Assessment & Plan Note (Signed)
Blood pressure as outlined.  Continue micardis 80mg  q day.  Follow pressures.  Follow metabolic panel.

## 2021-07-16 DIAGNOSIS — I10 Essential (primary) hypertension: Secondary | ICD-10-CM | POA: Diagnosis not present

## 2021-07-16 DIAGNOSIS — I872 Venous insufficiency (chronic) (peripheral): Secondary | ICD-10-CM | POA: Diagnosis not present

## 2021-07-16 DIAGNOSIS — I2510 Atherosclerotic heart disease of native coronary artery without angina pectoris: Secondary | ICD-10-CM | POA: Diagnosis not present

## 2021-07-16 DIAGNOSIS — F419 Anxiety disorder, unspecified: Secondary | ICD-10-CM | POA: Diagnosis not present

## 2021-07-16 DIAGNOSIS — Z471 Aftercare following joint replacement surgery: Secondary | ICD-10-CM | POA: Diagnosis not present

## 2021-07-16 DIAGNOSIS — F329 Major depressive disorder, single episode, unspecified: Secondary | ICD-10-CM | POA: Diagnosis not present

## 2021-07-16 DIAGNOSIS — E1151 Type 2 diabetes mellitus with diabetic peripheral angiopathy without gangrene: Secondary | ICD-10-CM | POA: Diagnosis not present

## 2021-07-16 DIAGNOSIS — D509 Iron deficiency anemia, unspecified: Secondary | ICD-10-CM | POA: Diagnosis not present

## 2021-07-16 DIAGNOSIS — J449 Chronic obstructive pulmonary disease, unspecified: Secondary | ICD-10-CM | POA: Diagnosis not present

## 2021-07-17 ENCOUNTER — Ambulatory Visit: Payer: Medicare HMO | Admitting: Pharmacist

## 2021-07-17 DIAGNOSIS — I10 Essential (primary) hypertension: Secondary | ICD-10-CM

## 2021-07-17 DIAGNOSIS — E1159 Type 2 diabetes mellitus with other circulatory complications: Secondary | ICD-10-CM

## 2021-07-17 DIAGNOSIS — I480 Paroxysmal atrial fibrillation: Secondary | ICD-10-CM

## 2021-07-17 DIAGNOSIS — I2510 Atherosclerotic heart disease of native coronary artery without angina pectoris: Secondary | ICD-10-CM

## 2021-07-17 NOTE — Patient Instructions (Signed)
Visit Information  PATIENT GOALS:  Goals Addressed               This Visit's Progress     Patient Stated     Medication Monitoring (pt-stated)        Patient Goals/Self-Care Activities Over the next 90 days, patient will:  - take medications as prescribed collaborate with provider on medication access solutions         Patient verbalizes understanding of instructions provided today and agrees to view in [place].   Plan: Telephone follow up appointment with care management team member scheduled for:  ~ 6 weeks  [name], PharmD, [place], [place] Clinical Pharmacist [employer] [facility] [employer] [phone]

## 2021-07-17 NOTE — Chronic Care Management (AMB) (Signed)
Chronic Care Management Pharmacy Note  07/17/2021 Name:  Kathryn Friedman MRN:  [id number] DOB:  [birth year]   Subjective: Kathryn Friedman is an 65 y.o. year old female who is a primary patient of Einar Pheasant, MD.  The CCM team was consulted for assistance with disease management and care coordination needs.    Engaged with patient by telephone for follow up visit in response to provider referral for pharmacy case management and/or care coordination services.   Consent to Services:  The patient was given information about Chronic Care Management services, agreed to services, and gave verbal consent prior to initiation of services.  Please see initial visit note for detailed documentation.   Patient Care Team: Einar Pheasant, MD as PCP - General (Internal Medicine) De Hollingshead, RPH-CPP (Pharmacist)   Objective:  Lab Results  Component Value Date   CREATININE 0.85 07/06/2021   CREATININE 0.69 06/20/2021   CREATININE 1.09 (H) 06/19/2021    Lab Results  Component Value Date   HGBA1C 6.4 07/06/2021   Last diabetic Eye exam:  Lab Results  Component Value Date/Time   HMDIABEYEEXA No Retinopathy 11/06/2020 12:00 AM    Last diabetic Foot exam:  Lab Results  Component Value Date/Time   HMDIABFOOTEX by Dr Cleda Mccreedy - podiatry 12/10/2018 12:00 AM        Component Value Date/Time   CHOL 102 07/06/2021 0955   TRIG 89.0 07/06/2021 0955   HDL 42.50 07/06/2021 0955   CHOLHDL 2 07/06/2021 0955   VLDL 17.8 07/06/2021 0955   LDLCALC 41 07/06/2021 0955   LDLDIRECT 103.0 03/30/2021 0909    Hepatic Function Latest Ref Rng & Units 07/06/2021 06/06/2021 05/23/2021  Total Protein 6.0 - 8.3 g/dL 5.9(L) 6.2(L) 6.2  Albumin 3.5 - 5.2 g/dL 3.5 3.4(L) 3.9  AST 0 - 37 U/L '18 22 20  ' ALT 0 - 35 U/L '11 13 14  ' Alk Phosphatase 39 - 117 U/L 89 54 69  Total Bilirubin 0.2 - 1.2 mg/dL 0.5 0.7 0.6  Bilirubin, Direct 0.0 - 0.3 mg/dL 0.1 - 0.2    Lab Results  Component  Value Date/Time   TSH 0.87 07/06/2021 09:55 AM   TSH 1.56 03/17/2020 09:01 AM    CBC Latest Ref Rng & Units 07/06/2021 06/20/2021 06/19/2021  WBC 4.0 - 10.5 K/uL 9.5 7.3 13.3(H)  Hemoglobin 12.0 - 15.0 g/dL 12.8 11.8(L) 11.6(L)  Hematocrit 36.0 - 46.0 % 38.6 36.4 34.2(L)  Platelets 150.0 - 400.0 K/uL 236.0 179 193    No results found for: VD25OH  Clinical ASCVD: No  The ASCVD Risk score Mikey Bussing DC Jr., et al., 2013) failed to calculate for the following reasons:   The valid total cholesterol range is 130 to 320 mg/dL     Social History   Tobacco Use  Smoking Status Every Day   Packs/day: 1.50   Years: 45.00   Pack years: 67.50   Types: Cigarettes  Smokeless Tobacco Never   BP Readings from Last 3 Encounters:  07/10/21 139/77  06/22/21 138/80  06/13/21 116/68   Pulse Readings from Last 3 Encounters:  07/10/21 85  06/22/21 77  06/13/21 71   Wt Readings from Last 3 Encounters:  06/20/21 218 lb 14.7 oz (99.3 kg)  06/12/21 213 lb (96.6 kg)  06/06/21 217 lb 13 oz (98.8 kg)    Assessment: Review of patient past medical history, allergies, medications, health status, including review of consultants reports, laboratory and other test data, was performed as part of comprehensive  evaluation and provision of chronic care management services.   SDOH:  (Social Determinants of Health) assessments and interventions performed:  SDOH Interventions    Flowsheet Row Most Recent Value  SDOH Interventions   Financial Strain Interventions Other (Comment)  [manufacturer assistance]       CCM Care Plan  Allergies  Allergen Reactions   Varenicline Other (See Comments)    "I got really depressed" "I got really depressed" CHANTEX   Jardiance [Empagliflozin] Other (See Comments)    Yeast infection   Methylprednisolone Nausea Only and Nausea And Vomiting    Medications Reviewed Today     Reviewed by Einar Pheasant, MD (Physician) on 07/15/21 at [place] List Status: <None>    Medication Order Taking? Sig Documenting Provider Last Dose Status Informant  acetaminophen (TYLENOL) 325 MG tablet [id number]  Take 650 mg by mouth every 6 (six) hours as needed. [provider]  Active Self  albuterol (VENTOLIN HFA) 108 (90 Base) MCG/ACT inhaler [id number]  INHALE 2 PUFFS FOUR TIMES A DAY  Patient taking differently: Inhale 2 puffs into the lungs every 4 (four) hours as needed for wheezing or shortness of breath.   Einar Pheasant, MD  Active            Med Note Darnelle Maffucci, Arville Lime   Fri May 11, 2021 10:50 AM) 0-2 times daily  amLODipine (NORVASC) 5 MG tablet [id number]  Take 1 tablet (5 mg total) by mouth daily. Einar Pheasant, MD  Active Self  apixaban (ELIQUIS) 5 MG TABS tablet [id number]  Take 1 tablet (5 mg total) by mouth 2 (two) times daily. Einar Pheasant, MD  Active   aspirin 81 MG tablet [id number]  Take 81 mg by mouth daily.  [provider]  Active Self  Blood Glucose Monitoring Suppl (TRUE METRIX METER) w/Device KIT [id number]   [provider]  Active Self  Budeson-Glycopyrrol-Formoterol (BREZTRI AEROSPHERE) 160-9-4.8 MCG/ACT AERO [id number]  Inhale 2 puffs into the lungs in the morning and at bedtime. Einar Pheasant, MD  Active Self  CALCIUM PO [id number]  Take 600 mg by mouth daily.  [provider]  Active Self  cyclobenzaprine (FLEXERIL) 10 MG tablet [id number]  TAKE 1 TABLET THREE TIMES DAILY AS NEEDED FOR MUSCLE SPASM(S)  Patient taking differently: Take 10 mg by mouth 3 (three) times daily.   Einar Pheasant, MD  Active            Med Note Gentry Roch   Wed May 30, 2021 12:52 PM)    diclofenac (VOLTAREN) 75 MG EC tablet [id number]  Take 75 mg by mouth 2 (two) times daily. [provider]  Active Self  dicyclomine (BENTYL) 20 MG tablet [id number]  Take 20 mg by mouth every 6 (six) hours as needed. [provider]  Active Self  DULoxetine (CYMBALTA) 60 MG capsule [id number]  Take 1 capsule (60 mg total)  by mouth daily. Einar Pheasant, MD  Active Self  famotidine (PEPCID) 40 MG tablet [id number]  TAKE 1 TABLET (40 MG TOTAL) BY MOUTH DAILY. Einar Pheasant, MD  Active   gabapentin (NEURONTIN) 300 MG capsule [id number]  Take 1 capsule (300 mg total) by mouth at bedtime. Ursula Alert, MD  Active   glucose blood test strip [id number]  Use as instructed to check blood sugars twice daily. Dx E11.9. Einar Pheasant, MD  Active Self  isosorbide mononitrate (IMDUR) 30 MG 24 hr tablet [id number]  TAKE 1 TABLET EVERY DAY  Patient taking differently: Take 30 mg by  mouth daily.   Einar Pheasant, MD  Active   L-FORMULA LYSINE HCL PO [id number]  Take 1,000 mg by mouth daily. [provider]  Active Self  lamoTRIgine (LAMICTAL) 25 MG tablet [id number]  Take 1 tablet (25 mg total) by mouth daily. TAKE 1 TABLET EVERY DAY FOR MOOD Eappen, Saramma, MD  Active   metFORMIN (GLUCOPHAGE-XR) 500 MG 24 hr tablet [id number]  Take 2 tablets (1,000 mg total) by mouth in the morning and at bedtime. Einar Pheasant, MD  Active Self  Multiple Vitamin (MULTIVITAMIN PO) [id number]  Take 1 tablet by mouth daily. [provider]  Active Self  nystatin (MYCOSTATIN/NYSTOP) powder [id number]  APPLY TO THE AFFECTED AREA(S) TWICE DAILY  Patient taking differently: Apply 1 application topically daily as needed (Yeast infection).   Einar Pheasant, MD  Active   oxybutynin (DITROPAN XL) 15 MG 24 hr tablet [id number]  Take 1 tablet (15 mg total) by mouth daily. Abbie Sons, MD  Active   oxyCODONE (OXY IR/ROXICODONE) 5 MG immediate release tablet [id number]  Take 1-2 tablets (5-10 mg total) by mouth every 4 (four) hours as needed for moderate pain (pain score 4-6). Sidney Ace, MD  Active   oxymetazoline (AFRIN) 0.05 % nasal spray [id number]  Place 1 spray into both nostrils 2 (two) times daily. [provider]  Active Self  pantoprazole (PROTONIX) 40 MG tablet [id number]  TAKE 1 TABLET TWICE DAILY BEFORE MEALS   Patient taking differently: Take 40 mg by mouth 2 (two) times daily.   Einar Pheasant, MD  Active   Potassium 99 MG TABS [id number]  Take 99 mg by mouth daily. [provider]  Active Self  propranolol (INDERAL) 40 MG tablet [id number]  1 tab daily PO  Patient taking differently: Take 40 mg by mouth daily.   Einar Pheasant, MD  Active   rosuvastatin (CRESTOR) 20 MG tablet [id number]  Take 1 tablet (20 mg total) by mouth daily. Einar Pheasant, MD  Active Self  telmisartan (MICARDIS) 80 MG tablet [id number]  Take 1 tablet (80 mg total) by mouth daily. Einar Pheasant, MD  Active Self  traZODone (DESYREL) 100 MG tablet [id number]  Take 2 tablets (200 mg total) by mouth at bedtime. Einar Pheasant, MD  Active Self            Patient Active Problem List   Diagnosis Date Noted   Close exposure to COVID-19 virus 07/15/2021   Electrolyte disorder 07/01/2021   Closed nondisplaced fracture of greater trochanter of left femur ([place]) 06/22/2021   Hip fx ([place]) 06/19/2021   Status post total hip replacement, left 06/12/2021   Paroxysmal A-fib ([place]) 06/07/2021   Primary osteoarthritis of left hip 05/18/2021   Moderate episode of recurrent major depressive disorder ([place]) 04/05/2021   Aortic atherosclerosis ([place]) 04/03/2021   SOBOE (shortness of breath on exertion) 01/16/2021   Thrush 12/27/2020   Suspected COVID-19 virus infection 12/20/2020   MDD (major depressive disorder), recurrent episode, mild ([place]) 11/16/2020   Abdominal wall abscess 07/30/2020   Skin lesion 07/23/2020   Boil 07/20/2020   MDD (major depressive disorder), recurrent, in full remission ([place]) 06/26/2020   Insomnia due to medical condition 06/26/2020   Anxiety disorder 06/26/2020   Cough 04/03/2020   Urge incontinence 03/02/2020   Urinary frequency 02/06/2020   Bilateral carotid artery stenosis 10/15/2019   PSVT (paroxysmal supraventricular tachycardia) ([place]) 08/09/2019   History of migraine headaches 05/10/2019    Lower abdominal pain 03/21/2019   Dysphagia 09/15/2018  ILD (interstitial lung disease) ([place]) 08/17/2017   Tobacco use disorder 08/11/2016   Venous insufficiency of both lower extremities 07/10/2016   Healthcare maintenance 06/18/2016   Lump in the abdomen 08/06/2015   Dizziness 08/06/2015   Fatty infiltration of liver 08/11/2014   Blood in the stool 08/11/2014   Acute diarrhea 08/11/2014   Hemorrhage of gastrointestinal tract 08/07/2014   GERD (gastroesophageal reflux disease) 10/20/2013   Iron deficiency anemia 10/20/2013   Nausea with vomiting 10/19/2013   LUQ pain 10/19/2013   Diarrhea 09/26/2013   Anemia 04/12/2013   CAD (coronary artery disease) 02/18/2013   COPD (chronic obstructive pulmonary disease) ([place]) 02/18/2013   Obstructive sleep apnea 02/18/2013   Mild depression ([place]) 02/18/2013   Diverticulosis 02/18/2013   Diabetes ([place]) 02/17/2013   Essential hypertension, benign 02/17/2013   Hypercholesterolemia 02/17/2013    Immunization History  Administered Date(s) Administered   Influenza,inj,Quad PF,6+ Mos 09/23/2013, 09/09/2014, 08/01/2015, 08/06/2016, 08/14/2017, 08/14/2018, 09/16/2019, 07/13/2020   Influenza-Unspecified 10/24/2011   PFIZER(Purple Top)SARS-COV-2 Vaccination 02/23/2020, 03/15/2020, 02/09/2021   Pneumococcal Polysaccharide-23 09/23/2013   Td 04/21/2018    Conditions to be addressed/monitored: HTN, HLD, and COPD  Care Plan : General Pharmacy (Adult)  Updates made by De Hollingshead, RPH-CPP since 07/17/2021 12:00 AM     Problem: COPD, CAD, HLD, HTN      Long-Range Goal: Disease Progression Prevention   This Visit's Progress: On track  Recent Progress: On track  Priority: High  Note:   Current Barriers:  Unable to independently afford treatment regimen Complex patient with multiple comorbidities including diabetes, coronary artery disease, COPD, depression/anxiety  Pharmacist Clinical Goal(s):  Over the next 90 days, patient will  verbalize ability to afford treatment regimen. Over the next 90 days, patient will adhere to prescribed medication regimen  Interventions: 1:1 collaboration with Einar Pheasant, MD regarding development and update of comprehensive plan of care as evidenced by provider attestation and co-signature Inter-disciplinary care team collaboration (see longitudinal plan of care) Comprehensive medication review performed; medication list updated in electronic medical record  Diabetes: Controlled; current treatment: metformin XR 1000 mg BID Previously recommended to continue current regimen at this time.   Hypertension, Coronary Artery Disease, new diagnosis of AFib Appropriately managed, follows w/ Dr. Nehemiah Massed current treatment:  Rate control: propranolol 40 mg daily  Anticoagulation: Eliquis 5 mg BID Additional antihypertensives/antianginals: amlodipine 5 mg daily, isosorbide 30 mg daily, telmisartan 80 mg daily Working on Eliquis patient assistance. Patient provided out of pocket spend. Will collaborate w/ CPhT, provider for follow up  Hyperlipidemia, ASCVD Risk Reduction Controlled per last lipid panel; current treatment: atorvastatin 20 mg daily Antiplatelet therapy: none given concurrent anticoagulation Previously recommended to continue current regimen at this time  Chronic Obstructive Pulmonary Disease: Controlled; current treatment: Breztri 160/9/4.8 mcg 2 puffs BID; albuterol HFA PRN - 1-2 times daily due to wheezing and SOB; follows w/ Dr. Raul Del [facility] [place] through [facility] patient assistance Previously recommended to continue current regimen at this time along with pulmonology collaboration  Depression/Anxiety/Insomnia/Pain: Exacerbated by pain; follows w/ Dr. Shea Evans. Current regimen: lamotrigine 25 mg daily; duloxetine 60 mg daily, trazodone 200 mg QPM; started on hydroxyzine 25 mg QPM in May with no benefit, started on gabapentin 300 mg QPM earlier this month.  Previously  recommended to continue current regimen along with collaboration w/ psychiatry and LCSW.   Pain, s/p recent hip replacement with subsequent fracture Uncontrolled; current treatment: diclofenac 75 mg BID, cyclobenzaprine 5 mg up to TID for pain/muscle spasms, acetaminophen 500 mg BID  PRN, tramadol 50 mg BID PRN, gabapentin as above. Follows w/ Tamala Julian, orthopedist Recent script for oxycodone 5 mg 1-2 tablets Q4H s/p hip replacement   Diarrhea, chronic with GERD: Moderately well controlled per patient report; Current regimen: taking imodium daily from OTC insurance benefits; pantoprazole 40 mg BID with famotidine 40 mg daily, prescribed dicyclomine 10 mg PRN but has not started this yet Previously encouraged to continue to collaborate with GI to determine the best long term plan for management of chronic diarrhea.   Overactive Bladder: Managed per patient report; current regimen: oxybutynin XL 10 mg daily, follows w/ Dr. Bernardo Heater Previously on tolterodine, beneficial at first but then waned over time Myrbetriq too expensive on her insurance Previously recommended to continue current regimen and collaboration w/ urology  Tobacco Abuse: 1-1.5 packs per day; 50 years of use Previous quit attempts: unsuccessful using varenicline (nightmares) Triggers to smoke: stress, familial stress lately  Previously encouraged to start nicotine patches + gum when ready to quit.   Patient Goals/Self-Care Activities Over the next 90 days, patient will:  - take medications as prescribed collaborate with provider on medication access solutions  Follow Up Plan: Telephone follow up appointment with care management team member scheduled for:~ 6 weeks      Medication Assistance: Breztri obtained through [facility] medication assistance program.  Enrollment ends 11/24/21.  Eliquis application in progress  Patient's preferred pharmacy is:  [facility] - [place], [address] [place] [address] Phone: [phone] Fax: [phone]  [id number] [id number]: [phone]: [phone]   Follow Up:  Patient agrees to Care Plan and Follow-up.  Plan: Telephone follow up appointment with care management team member scheduled for:  ~ 6 weeks  Catie TDarnelle Maffucci PharmD, BNew [place] CMappsvilleClinical Pharmacist LOccidental Petroleumat BJohnson & [phone]

## 2021-07-19 ENCOUNTER — Telehealth: Payer: Self-pay | Admitting: Internal Medicine

## 2021-07-19 MED ORDER — NYSTATIN 100000 UNIT/GM EX POWD: Freq: Two times a day (BID) | CUTANEOUS | 0 refills | Status: DC

## 2021-07-19 NOTE — Telephone Encounter (Signed)
Patient called in stating that her refill for her nystatin (MYCOSTATIN/NYSTOP) powder is out of date and her pharmacy told her to call her doctors office to let them know they have faxed over a request for her.

## 2021-07-19 NOTE — Telephone Encounter (Signed)
Rx sent in for nystatin powder - [facility] [address].

## 2021-07-20 ENCOUNTER — Other Ambulatory Visit: Payer: Self-pay

## 2021-07-20 MED ORDER — NYSTATIN 100000 UNIT/GM EX POWD: Freq: Two times a day (BID) | CUTANEOUS | 0 refills | Status: DC

## 2021-07-20 NOTE — Telephone Encounter (Signed)
Resent to [facility] per patient request

## 2021-07-20 NOTE — Telephone Encounter (Signed)
Incoming call from pt on triage line who states that the increased dose of Oxybutynin has not helped. She would like to move forward with seeing Dr.Macdiarmid. Appt scheduled.

## 2021-07-23 ENCOUNTER — Other Ambulatory Visit: Payer: Self-pay

## 2021-07-23 ENCOUNTER — Encounter: Payer: Self-pay | Admitting: Urology

## 2021-07-23 ENCOUNTER — Ambulatory Visit (INDEPENDENT_AMBULATORY_CARE_PROVIDER_SITE_OTHER): Payer: Medicare HMO | Admitting: Urology

## 2021-07-23 VITALS — BP 137/75 | HR 90 | Ht 64.02 in | Wt 218.0 lb

## 2021-07-23 DIAGNOSIS — N3281 Overactive bladder: Secondary | ICD-10-CM

## 2021-07-23 DIAGNOSIS — N3946 Mixed incontinence: Secondary | ICD-10-CM | POA: Diagnosis not present

## 2021-07-23 LAB — MICROSCOPIC EXAMINATION: Epithelial Cells (non renal): >10 /hpf — AB (ref 0–10)

## 2021-07-23 LAB — URINALYSIS, COMPLETE
Bilirubin, UA: NEGATIVE
Glucose, UA: NEGATIVE
Ketones, UA: NEGATIVE
Nitrite, UA: POSITIVE — AB
Protein,UA: NEGATIVE
RBC, UA: NEGATIVE
Specific Gravity, UA: 1.01 (ref 1.005–1.030)
Urobilinogen, Ur: 0.2 mg/dL (ref 0.2–1.0)
pH, UA: 6 (ref 5.0–7.5)

## 2021-07-23 MED ORDER — TRIMETHOPRIM 100 MG PO TABS: 100.0000 mg | ORAL_TABLET | Freq: Every day | ORAL | 11 refills | Status: DC

## 2021-07-23 NOTE — Patient Instructions (Signed)
Cystoscopy Cystoscopy is a procedure that is used to help diagnose and sometimes treat conditions that affect the lower urinary tract. The lower urinary tract includes the bladder and the urethra. The urethra is the tube that drains urine from the bladder. Cystoscopy is done using a thin, tube-shaped instrument with a light and camera at the end (cystoscope). The cystoscope may be hard or flexible, depending on the goal of the procedure. The cystoscope is inserted through the urethra, into the bladder. Cystoscopy may be recommended if you have: Urinary tract infections that keep coming back. Blood in the urine (hematuria). An inability to control when you urinate (urinary incontinence) or an overactive bladder. Unusual cells found in a urine sample. A blockage in the urethra, such as a urinary stone. Painful urination. An abnormality in the bladder found during an intravenous pyelogram (IVP) or CT scan. Cystoscopy may also be done to remove a sample of tissue to be examined under a microscope (biopsy). What are the risks? Generally, this is a safe procedure. However, problems may occur, including: Infection. Bleeding.  What happens during the procedure?  You will be given one or more of the following: A medicine to numb the area (local anesthetic). The area around the opening of your urethra will be cleaned. The cystoscope will be passed through your urethra into your bladder. Germ-free (sterile) fluid will flow through the cystoscope to fill your bladder. The fluid will stretch your bladder so that your health care provider can clearly examine your bladder walls. Your doctor will look at the urethra and bladder. The cystoscope will be removed The procedure may vary among health care providers  What can I expect after the procedure? After the procedure, it is common to have: Some soreness or pain in your abdomen and urethra. Urinary symptoms. These include: Mild pain or burning when you  urinate. Pain should stop within a few minutes after you urinate. This may last for up to 1 week. A small amount of blood in your urine for several days. Feeling like you need to urinate but producing only a small amount of urine. Follow these instructions at home: General instructions Return to your normal activities as told by your health care provider.  Do not drive for 24 hours if you were given a sedative during your procedure. Watch for any blood in your urine. If the amount of blood in your urine increases, call your health care provider. If a tissue sample was removed for testing (biopsy) during your procedure, it is up to you to get your test results. Ask your health care provider, or the department that is doing the test, when your results will be ready. Drink enough fluid to keep your urine pale yellow. Keep all follow-up visits as told by your health care provider. This is important. Contact a health care provider if you: Have pain that gets worse or does not get better with medicine, especially pain when you urinate. Have trouble urinating. Have more blood in your urine. Get help right away if you: Have blood clots in your urine. Have abdominal pain. Have a fever or chills. Are unable to urinate. Summary Cystoscopy is a procedure that is used to help diagnose and sometimes treat conditions that affect the lower urinary tract. Cystoscopy is done using a thin, tube-shaped instrument with a light and camera at the end. After the procedure, it is common to have some soreness or pain in your abdomen and urethra. Watch for any blood in your urine.   If the amount of blood in your urine increases, call your health care provider. If you were prescribed an antibiotic medicine, take it as told by your health care provider. Do not stop taking the antibiotic even if you start to feel better. This information is not intended to replace advice given to you by your health care provider. Make  sure you discuss any questions you have with your health care provider. Document Revised: 11/03/2018 Document Reviewed: 11/03/2018 Elsevier Patient Education  2020 [employer]  
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
## 2021-07-24 ENCOUNTER — Telehealth: Payer: Self-pay | Admitting: Pharmacy Technician

## 2021-07-24 DIAGNOSIS — Z596 Low income: Secondary | ICD-10-CM

## 2021-07-24 DIAGNOSIS — G4733 Obstructive sleep apnea (adult) (pediatric): Secondary | ICD-10-CM | POA: Diagnosis not present

## 2021-07-24 NOTE — Progress Notes (Signed)
[facility])                                            [place] Team    07/24/2021  MELIKE KLOMP 10-20-1956 [id number]                                      Medication Assistance Referral  Referral From: [facility] Embedded RPh Catie T.   Medication/Company: Eliquis / [employer] Patient application portion:  N/A Signed in clinic with PharmD Provider application portion:  N/A signed in clinic with PharmD  to Dr. Einar Pheasant Provider address/fax verified via: Office website  Received both patient and provider portion(s) of patient assistance application(s) for Eliquis. Faxed completed application and required documents into [employer]  Kanita Delage P. Norah Fick, [place]  [phone] .

## 2021-07-25 DIAGNOSIS — E1159 Type 2 diabetes mellitus with other circulatory complications: Secondary | ICD-10-CM | POA: Diagnosis not present

## 2021-07-25 DIAGNOSIS — J449 Chronic obstructive pulmonary disease, unspecified: Secondary | ICD-10-CM

## 2021-07-25 DIAGNOSIS — I2510 Atherosclerotic heart disease of native coronary artery without angina pectoris: Secondary | ICD-10-CM

## 2021-07-25 DIAGNOSIS — E7800 Pure hypercholesterolemia, unspecified: Secondary | ICD-10-CM

## 2021-07-25 DIAGNOSIS — F330 Major depressive disorder, recurrent, mild: Secondary | ICD-10-CM | POA: Diagnosis not present

## 2021-07-25 DIAGNOSIS — I480 Paroxysmal atrial fibrillation: Secondary | ICD-10-CM | POA: Diagnosis not present

## 2021-07-25 DIAGNOSIS — I10 Essential (primary) hypertension: Secondary | ICD-10-CM | POA: Diagnosis not present

## 2021-07-26 LAB — CULTURE, URINE COMPREHENSIVE

## 2021-07-27 ENCOUNTER — Telehealth: Payer: Self-pay | Admitting: Pharmacy Technician

## 2021-07-27 ENCOUNTER — Telehealth: Payer: Self-pay

## 2021-07-27 DIAGNOSIS — F419 Anxiety disorder, unspecified: Secondary | ICD-10-CM | POA: Diagnosis not present

## 2021-07-27 DIAGNOSIS — Z471 Aftercare following joint replacement surgery: Secondary | ICD-10-CM | POA: Diagnosis not present

## 2021-07-27 DIAGNOSIS — I10 Essential (primary) hypertension: Secondary | ICD-10-CM | POA: Diagnosis not present

## 2021-07-27 DIAGNOSIS — Z596 Low income: Secondary | ICD-10-CM

## 2021-07-27 DIAGNOSIS — M47816 Spondylosis without myelopathy or radiculopathy, lumbar region: Secondary | ICD-10-CM | POA: Insufficient documentation

## 2021-07-27 DIAGNOSIS — D509 Iron deficiency anemia, unspecified: Secondary | ICD-10-CM | POA: Diagnosis not present

## 2021-07-27 DIAGNOSIS — E1151 Type 2 diabetes mellitus with diabetic peripheral angiopathy without gangrene: Secondary | ICD-10-CM | POA: Diagnosis not present

## 2021-07-27 DIAGNOSIS — J449 Chronic obstructive pulmonary disease, unspecified: Secondary | ICD-10-CM | POA: Diagnosis not present

## 2021-07-27 DIAGNOSIS — I2510 Atherosclerotic heart disease of native coronary artery without angina pectoris: Secondary | ICD-10-CM | POA: Diagnosis not present

## 2021-07-27 DIAGNOSIS — F329 Major depressive disorder, single episode, unspecified: Secondary | ICD-10-CM | POA: Diagnosis not present

## 2021-07-27 DIAGNOSIS — S72115D Nondisplaced fracture of greater trochanter of left femur, subsequent encounter for closed fracture with routine healing: Secondary | ICD-10-CM | POA: Diagnosis not present

## 2021-07-27 DIAGNOSIS — I872 Venous insufficiency (chronic) (peripheral): Secondary | ICD-10-CM | POA: Diagnosis not present

## 2021-07-27 MED ORDER — SULFAMETHOXAZOLE-TRIMETHOPRIM 800-160 MG PO TABS: 1.0000 | ORAL_TABLET | Freq: Two times a day (BID) | ORAL | 0 refills | Status: AC

## 2021-07-27 NOTE — Progress Notes (Signed)
[facility])                                            [place] Team    07/27/2021  TEKESHIA MATSUBARA 25-Feb-1956 [id number]  Care coordination call placed to [employer] in regards to Eliquis application.  Spoke to [place] who informed patient was APPROVED 07/25/21-/11/24/21.   Desire Fulp P. Valeda Corzine, [place]  [phone]

## 2021-07-27 NOTE — Telephone Encounter (Signed)
Dr Erlene Quan reviewed urine culture. Prescribe bactrim bid x 5 days. Please hold off on taking trimethoprim while taking bactrim. Medication sent to pharmacy . Left detailed message for pt

## 2021-08-01 ENCOUNTER — Telehealth: Payer: Self-pay

## 2021-08-01 ENCOUNTER — Other Ambulatory Visit: Payer: Self-pay | Admitting: Internal Medicine

## 2021-08-01 NOTE — Telephone Encounter (Signed)
See previous encounter. Pt being treated.

## 2021-08-01 NOTE — Telephone Encounter (Signed)
-----   Message from Bjorn Loser, MD sent at 07/30/2021  9:31 AM EDT ----- Cipro 250 mg bid for 7 days Then restart daily triprim ' ----- Message ----- From: Alvera Novel, [place]: 07/25/2021   8:02 AM EDT To: Bjorn Loser, MD   ----- Message ----- From: Lavone Neri Lab Results In Sent: 07/23/2021   4:36 PM EDT To: [facility] Clinical

## 2021-08-03 ENCOUNTER — Other Ambulatory Visit: Payer: Self-pay | Admitting: Urology

## 2021-08-04 DIAGNOSIS — F329 Major depressive disorder, single episode, unspecified: Secondary | ICD-10-CM | POA: Diagnosis not present

## 2021-08-04 DIAGNOSIS — E1151 Type 2 diabetes mellitus with diabetic peripheral angiopathy without gangrene: Secondary | ICD-10-CM | POA: Diagnosis not present

## 2021-08-04 DIAGNOSIS — J449 Chronic obstructive pulmonary disease, unspecified: Secondary | ICD-10-CM | POA: Diagnosis not present

## 2021-08-04 DIAGNOSIS — Z471 Aftercare following joint replacement surgery: Secondary | ICD-10-CM | POA: Diagnosis not present

## 2021-08-04 DIAGNOSIS — I10 Essential (primary) hypertension: Secondary | ICD-10-CM | POA: Diagnosis not present

## 2021-08-04 DIAGNOSIS — F419 Anxiety disorder, unspecified: Secondary | ICD-10-CM | POA: Diagnosis not present

## 2021-08-04 DIAGNOSIS — I2510 Atherosclerotic heart disease of native coronary artery without angina pectoris: Secondary | ICD-10-CM | POA: Diagnosis not present

## 2021-08-04 DIAGNOSIS — I872 Venous insufficiency (chronic) (peripheral): Secondary | ICD-10-CM | POA: Diagnosis not present

## 2021-08-04 DIAGNOSIS — D509 Iron deficiency anemia, unspecified: Secondary | ICD-10-CM | POA: Diagnosis not present

## 2021-08-06 ENCOUNTER — Ambulatory Visit (INDEPENDENT_AMBULATORY_CARE_PROVIDER_SITE_OTHER): Payer: Medicare HMO | Admitting: Pharmacist

## 2021-08-06 DIAGNOSIS — I10 Essential (primary) hypertension: Secondary | ICD-10-CM

## 2021-08-06 DIAGNOSIS — J449 Chronic obstructive pulmonary disease, unspecified: Secondary | ICD-10-CM

## 2021-08-06 DIAGNOSIS — F320 Major depressive disorder, single episode, mild: Secondary | ICD-10-CM

## 2021-08-06 DIAGNOSIS — E1159 Type 2 diabetes mellitus with other circulatory complications: Secondary | ICD-10-CM

## 2021-08-06 DIAGNOSIS — I480 Paroxysmal atrial fibrillation: Secondary | ICD-10-CM

## 2021-08-06 DIAGNOSIS — I2510 Atherosclerotic heart disease of native coronary artery without angina pectoris: Secondary | ICD-10-CM

## 2021-08-06 DIAGNOSIS — F32A Depression, unspecified: Secondary | ICD-10-CM

## 2021-08-06 NOTE — Patient Instructions (Signed)
Visit Information  PATIENT GOALS:  Goals Addressed               This Visit's Progress     Patient Stated     Medication Monitoring (pt-stated)        Patient Goals/Self-Care Activities Over the next 90 days, patient will:  - take medications as prescribed collaborate with provider on medication access solutions        Patient verbalizes understanding of instructions provided today and agrees to view in [place].    Plan: Telephone follow up appointment with care management team member scheduled for:  ~ 6 weeks  [name], PharmD, [place], [place] Clinical Pharmacist [employer] [facility] [employer] [phone]

## 2021-08-06 NOTE — Chronic Care Management (AMB) (Signed)
Chronic Care Management Pharmacy Note  08/06/2021 Name:  Kathryn Friedman MRN:  [id number] DOB:  June 03, 1956   Subjective: Kathryn Friedman is an 65 y.o. year old female who is a primary patient of Einar Pheasant, MD.  The CCM team was consulted for assistance with disease management and care coordination needs.    Engaged with patient by telephone for follow up visit in response to provider referral for pharmacy case management and/or care coordination services.   Consent to Services:  The patient was given information about Chronic Care Management services, agreed to services, and gave verbal consent prior to initiation of services.  Please see initial visit note for detailed documentation.   Patient Care Team: Einar Pheasant, MD as PCP - General (Internal Medicine) De Hollingshead, RPH-CPP (Pharmacist)    Objective:  Lab Results  Component Value Date   CREATININE 0.85 07/06/2021   CREATININE 0.69 06/20/2021   CREATININE 1.09 (H) 06/19/2021    Lab Results  Component Value Date   HGBA1C 6.4 07/06/2021   Last diabetic Eye exam:  Lab Results  Component Value Date/Time   HMDIABEYEEXA No Retinopathy 11/06/2020 12:00 AM    Last diabetic Foot exam:  Lab Results  Component Value Date/Time   HMDIABFOOTEX by Dr Cleda Mccreedy - podiatry 12/10/2018 12:00 AM        Component Value Date/Time   CHOL 102 07/06/2021 0955   TRIG 89.0 07/06/2021 0955   HDL 42.50 07/06/2021 0955   CHOLHDL 2 07/06/2021 0955   VLDL 17.8 07/06/2021 0955   LDLCALC 41 07/06/2021 0955   LDLDIRECT 103.0 03/30/2021 0909    Hepatic Function Latest Ref Rng & Units 07/06/2021 06/06/2021 05/23/2021  Total Protein 6.0 - 8.3 g/dL 5.9(L) 6.2(L) 6.2  Albumin 3.5 - 5.2 g/dL 3.5 3.4(L) 3.9  AST 0 - 37 U/L _0 ALT 0 - 35 U/L _1 Alk Phosphatase 39 - 117 U/L 89 54 69  Total Bilirubin 0.2 - 1.2 mg/dL 0.5 0.7 0.6  Bilirubin, Direct 0.0 - 0.3 mg/dL 0.1 - 0.2    Lab Results   Component Value Date/Time   TSH 0.87 07/06/2021 09:55 AM   TSH 1.56 03/17/2020 09:01 AM    CBC Latest Ref Rng & Units 07/06/2021 06/20/2021 06/19/2021  WBC 4.0 - 10.5 K/uL 9.5 7.3 13.3(H)  Hemoglobin 12.0 - 15.0 g/dL 12.8 11.8(L) 11.6(L)  Hematocrit 36.0 - 46.0 % 38.6 36.4 34.2(L)  Platelets 150.0 - 400.0 K/uL 236.0 179 193    No results found for: VD25OH  Clinical ASCVD: No  The ASCVD Risk score (Arnett DK, et al., 2019) failed to calculate for the following reasons:   The valid total cholesterol range is 130 to 320 mg/dL      Social History   Tobacco Use  Smoking Status Every Day   Packs/day: 1.50   Years: 45.00   Pack years: 67.50   Types: Cigarettes  Smokeless Tobacco Never   BP Readings from Last 3 Encounters:  07/23/21 137/75  07/10/21 139/77  06/22/21 138/80   Pulse Readings from Last 3 Encounters:  07/23/21 90  07/10/21 85  06/22/21 77   Wt Readings from Last 3 Encounters:  07/23/21 218 lb (98.9 kg)  06/20/21 218 lb 14.7 oz (99.3 kg)  06/12/21 213 lb (96.6 kg)    Assessment: Review of patient past medical history, allergies, medications, health status, including review of consultants reports, laboratory and other test data, was performed as part of comprehensive evaluation  and provision of chronic care management services.   SDOH:  (Social Determinants of Health) assessments and interventions performed:  SDOH Interventions    Flowsheet Row Most Recent Value  SDOH Interventions   Financial Strain Interventions Other (Comment)  [manufacturer assistance]       CCM Care Plan  Allergies  Allergen Reactions   Varenicline Other (See Comments)    "I got really depressed" "I got really depressed" CHANTEX   Jardiance [Empagliflozin] Other (See Comments)    Yeast infection   Methylprednisolone Nausea Only and Nausea And Vomiting    Medications Reviewed Today     Reviewed by De Hollingshead, RPH-CPP (Pharmacist) on 08/06/21 at 1131  Med List  Status: <None>   Medication Order Taking? Sig Documenting Provider Last Dose Status Informant  acetaminophen (TYLENOL) 325 MG tablet [id number] Yes Take 650 mg by mouth every 6 (six) hours as needed. [provider] Taking Active Self  albuterol (VENTOLIN HFA) 108 (90 Base) MCG/ACT inhaler [id number] Yes INHALE 2 PUFFS FOUR TIMES A Lemont Fillers, MD Taking Active            Med Note Mayo Ao Aug 06, 2021 11:24 AM)    amLODipine (NORVASC) 5 MG tablet [id number] Yes Take 1 tablet (5 mg total) by mouth daily. Einar Pheasant, MD Taking Active Self  apixaban (ELIQUIS) 5 MG TABS tablet [id number] Yes Take 1 tablet (5 mg total) by mouth 2 (two) times daily. Einar Pheasant, MD Taking Active   Blood Glucose Monitoring Suppl (TRUE METRIX METER) w/Device Drucie Opitz [id number] Yes  [provider] Taking Active Self  Budeson-Glycopyrrol-Formoterol (BREZTRI AEROSPHERE) 160-9-4.8 MCG/ACT AERO [id number] Yes Inhale 2 puffs into the lungs in the morning and at bedtime. Einar Pheasant, MD Taking Active Self  CALCIUM PO [id number] Yes Take 600 mg by mouth daily.  [provider] Taking Active Self  cyclobenzaprine (FLEXERIL) 10 MG tablet [id number] Yes TAKE 1 TABLET THREE TIMES DAILY AS NEEDED FOR MUSCLE SPASM(S) Einar Pheasant, MD Taking Active            Med Note Gentry Roch   Wed May 30, 2021 12:52 PM)    diclofenac (VOLTAREN) 75 MG EC tablet [id number] Yes Take 75 mg by mouth 2 (two) times daily. [provider] Taking Active   dicyclomine (BENTYL) 20 MG tablet [id number] No Take 20 mg by mouth every 6 (six) hours as needed.  Patient not taking: Reported on 08/06/2021   [provider] Not Taking Active Self           Med Note Mayo Ao Aug 06, 2021 11:29 AM)    DULoxetine (CYMBALTA) 60 MG capsule [id number] Yes Take 1 capsule (60 mg total) by mouth daily. Einar Pheasant, MD Taking Active Self  famotidine (PEPCID) 40 MG tablet  [id number] Yes TAKE 1 TABLET (40 MG TOTAL) BY MOUTH DAILY. Einar Pheasant, MD Taking Active   gabapentin (NEURONTIN) 300 MG capsule [id number] Yes Take 1 capsule (300 mg total) by mouth at bedtime. Ursula Alert, MD Taking Active   isosorbide mononitrate (IMDUR) 30 MG 24 hr tablet [id number] Yes TAKE 1 TABLET EVERY DAY Einar Pheasant, MD Taking Active   L-FORMULA LYSINE HCL PO [id number] Yes Take 1,000 mg by mouth daily. [provider] Taking Active Self  lamoTRIgine (LAMICTAL) 25 MG tablet [id number] Yes Take 1 tablet (25 mg total) by mouth daily. TAKE 1 TABLET EVERY DAY FOR MOOD Ursula Alert, MD Taking Active   metFORMIN (  GLUCOPHAGE-XR) 500 MG 24 hr tablet [id number] Yes Take 2 tablets (1,000 mg total) by mouth in the morning and at bedtime. Einar Pheasant, MD Taking Active Self  Multiple Vitamin (MULTIVITAMIN PO) [id number] Yes Take 1 tablet by mouth daily. [provider] Taking Active Self  nystatin (MYCOSTATIN/NYSTOP) powder [id number] Yes Apply topically 2 (two) times daily. to affected area(s) Einar Pheasant, MD Taking Active   oxybutynin (DITROPAN XL) 15 MG 24 hr tablet [id number] Yes TAKE 1 TABLET (15 MG TOTAL) BY MOUTH DAILY. Abbie Sons, MD Taking Active   oxyCODONE (OXY IR/ROXICODONE) 5 MG immediate release tablet [id number] Yes Take 1-2 tablets (5-10 mg total) by mouth every 4 (four) hours as needed for moderate pain (pain score 4-6). Sidney Ace, MD Taking Active            Med Note Mayo Ao Aug 06, 2021 11:28 AM) 1-2 daily  pantoprazole (PROTONIX) 40 MG tablet [id number] Yes TAKE 1 TABLET TWICE DAILY BEFORE MEALS Einar Pheasant, MD Taking Active   Potassium 99 MG TABS [id number] Yes Take 99 mg by mouth daily. [provider] Taking Active Self  propranolol (INDERAL) 40 MG tablet [id number] Yes 1 tab daily PO Einar Pheasant, MD Taking Active   rosuvastatin (CRESTOR) 20 MG tablet [id number] Yes Take 1 tablet (20 mg total) by mouth  daily. Einar Pheasant, MD Taking Active Self  telmisartan (MICARDIS) 80 MG tablet [id number] Yes Take 1 tablet (80 mg total) by mouth daily. Einar Pheasant, MD Taking Active Self  traZODone (DESYREL) 100 MG tablet [id number] Yes Take 2 tablets (200 mg total) by mouth at bedtime. Einar Pheasant, MD Taking Active Self  trimethoprim (TRIMPEX) 100 MG tablet [id number] No Take 1 tablet (100 mg total) by mouth daily.  Patient not taking: Reported on 08/06/2021   Bjorn Loser, MD Not Taking Active   TRUE METRIX BLOOD GLUCOSE TEST test strip [id number] Yes USE AS INSTRUCTED TO CHECK BLOOD SUGARS TWICE DAILY. Einar Pheasant, MD Taking Active             Patient Active Problem List   Diagnosis Date Noted   Close exposure to COVID-19 virus 07/15/2021   Electrolyte disorder 07/01/2021   Closed nondisplaced fracture of greater trochanter of left femur ([place]) 06/22/2021   Hip fx ([place]) 06/19/2021   Status post total hip replacement, left 06/12/2021   Paroxysmal A-fib ([place]) 06/07/2021   Primary osteoarthritis of left hip 05/18/2021   Moderate episode of recurrent major depressive disorder ([place]) 04/05/2021   Aortic atherosclerosis ([place]) 04/03/2021   SOBOE (shortness of breath on exertion) 01/16/2021   Thrush 12/27/2020   Suspected COVID-19 virus infection 12/20/2020   MDD (major depressive disorder), recurrent episode, mild ([place]) 11/16/2020   Abdominal wall abscess 07/30/2020   Skin lesion 07/23/2020   Boil 07/20/2020   MDD (major depressive disorder), recurrent, in full remission ([place]) 06/26/2020   Insomnia due to medical condition 06/26/2020   Anxiety disorder 06/26/2020   Cough 04/03/2020   Urge incontinence 03/02/2020   Urinary frequency 02/06/2020   Bilateral carotid artery stenosis 10/15/2019   PSVT (paroxysmal supraventricular tachycardia) ([place]) 08/09/2019   History of migraine headaches 05/10/2019   Lower abdominal pain 03/21/2019   Dysphagia 09/15/2018   ILD (interstitial lung  disease) ([place]) 08/17/2017   Tobacco use disorder 08/11/2016   Venous insufficiency of both lower extremities 07/10/2016   Healthcare maintenance 06/18/2016   Lump in the abdomen 08/06/2015   Dizziness 08/06/2015   Fatty infiltration of liver  08/11/2014   Blood in the stool 08/11/2014   Acute diarrhea 08/11/2014   Hemorrhage of gastrointestinal tract 08/07/2014   GERD (gastroesophageal reflux disease) 10/20/2013   Iron deficiency anemia 10/20/2013   Nausea with vomiting 10/19/2013   LUQ pain 10/19/2013   Diarrhea 09/26/2013   Anemia 04/12/2013   CAD (coronary artery disease) 02/18/2013   COPD (chronic obstructive pulmonary disease) ([place]) 02/18/2013   Obstructive sleep apnea 02/18/2013   Mild depression ([place]) 02/18/2013   Diverticulosis 02/18/2013   Diabetes ([place]) 02/17/2013   Essential hypertension, benign 02/17/2013   Hypercholesterolemia 02/17/2013    Immunization History  Administered Date(s) Administered   Influenza,inj,Quad PF,6+ Mos 09/23/2013, 09/09/2014, 08/01/2015, 08/06/2016, 08/14/2017, 08/14/2018, 09/16/2019, 07/13/2020   Influenza-Unspecified 10/24/2011   PFIZER Comirnaty(Gray Top)Covid-19 Tri-Sucrose Vaccine 03/15/2020   PFIZER(Purple Top)SARS-COV-2 Vaccination 02/23/2020, 02/09/2021   Pneumococcal Polysaccharide-23 09/23/2013   Td 04/21/2018    Conditions to be addressed/monitored: CAD, HTN, HLD, and COPD  Care Plan : General Pharmacy (Adult)  Updates made by De Hollingshead, RPH-CPP since 08/06/2021 12:00 AM     Problem: COPD, CAD, HLD, HTN      Long-Range Goal: Disease Progression Prevention   This Visit's Progress: On track  Recent Progress: On track  Priority: High  Note:   Current Barriers:  Unable to independently afford treatment regimen Complex patient with multiple comorbidities including diabetes, coronary artery disease, COPD, depression/anxiety  Pharmacist Clinical Goal(s):  Over the next 90 days, patient will verbalize ability to  afford treatment regimen. Over the next 90 days, patient will adhere to prescribed medication regimen  Interventions: 1:1 collaboration with Einar Pheasant, MD regarding development and update of comprehensive plan of care as evidenced by provider attestation and co-signature Inter-disciplinary care team collaboration (see longitudinal plan of care) Comprehensive medication review performed; medication list updated in electronic medical record  SDOH: Reports husband has transitioned to Hospice. Patient is overwhelmed by caring for her health and her husband's. Sees Dr. Shea Evans but no longer has therapist. Discussed CCM support from RN CM and LCSW. Patient amenable.   Health Maintenance Yearly diabetic eye exam: up to date Yearly diabetic foot exam: up to date Urine microalbumin: up to date Yearly influenza vaccination: due - recommend yearly influenza vaccine Td/Tdap vaccination: up to date Pneumonia vaccination: due - will discuss moving forward COVID vaccinations: due for bivalent booster Shingrix vaccinations: due- will discuss moving forward Colonoscopy: up to date Bone density scan: due - scheduled  Diabetes: Controlled; current treatment: metformin XR 1000 mg BID Previously recommended to continue current regimen at this time.   Hypertension, Coronary Artery Disease, new diagnosis of AFib Appropriately managed, follows w/ Dr. Nehemiah Massed, but plans to transition to Dr. Rockey Situ; current treatment:  Rate control: propranolol 40 mg daily  Anticoagulation: Eliquis 5 mg BID Additional antihypertensives/antianginals: amlodipine 5 mg daily, isosorbide 30 mg daily, telmisartan 80 mg daily Approved for Eliquis patient assistance through 11/24/21 Patient notes she self-decreased aspirin when she started Eliquis. Encouraged to discuss w/ Dr. Rockey Situ at upcoming appointment.   Hyperlipidemia, ASCVD Risk Reduction Controlled per last lipid panel; current treatment: atorvastatin 20 mg  daily Antiplatelet therapy: none given concurrent anticoagulation Previously recommended to continue current regimen at this time  Chronic Obstructive Pulmonary Disease: Controlled; current treatment: Breztri 160/9/4.8 mcg 2 puffs BID; albuterol HFA PRN - 1-2 times daily due to wheezing and SOB; follows w/ Dr. Raul Del Receiving [place] through [facility] patient assistance Previously recommended to continue current regimen at this time along with pulmonology collaboration  Depression/Anxiety/Insomnia/Pain: Exacerbated  by pain; follows w/ Dr. Shea Evans. Current regimen: lamotrigine 25 mg daily; duloxetine 60 mg daily, trazodone 200 mg QPM; gabapentin 300 mg QPM Discussed CCM LCSW as above. Continue collaboration with Dr. Shea Evans.   Pain, s/p recent hip replacement with subsequent fracture Uncontrolled; current treatment: diclofenac 75 mg BID, cyclobenzaprine 5 mg up to TID for pain/muscle spasms - denies benefit, acetaminophen 500 mg BID PRN,  oxycodone 5 mg 1-2 tablets Q4H s/p hip replacement - though only taking ~ 2 doses daily as she is afraid she will run out and Dr. Roland Rack won't give her anymore Reports significant frustration with course of healing from hip replacement. Struggles getting around without a cane.  Discussed diclofenac in combination with Eliquis. Patient denies benefit from NSAID. Recommend to discontinue diclofenac and discuss alternative therapy with orthopedics.  RN CM referral as above.    Diarrhea, chronic with GERD: Moderately well controlled per patient report; Current regimen: taking imodium daily from OTC insurance benefits; pantoprazole 40 mg BID with famotidine 40 mg daily, dicyclomine 10 mg PRN  Previously encouraged to continue to collaborate with GI to determine the best long term plan for management of chronic diarrhea.   Overactive Bladder: Managed per patient report; current regimen: oxybutynin XL 15 mg daily, follows w/ Dr. Bernardo Heater; also placed on chronic trimethoprim  100 mg daily for UTI ppx. Cytoscopy upcoming.  Previously on tolterodine, beneficial at first but then waned over time Myrbetriq too expensive on her insurance Previously recommended to continue current regimen and collaboration w/ urology  Tobacco Abuse: 1-1.5 packs per day; 50 years of use Previous quit attempts: unsuccessful using varenicline (nightmares) Triggers to smoke: stress, familial stress lately as well as her own health concerns.  Previously encouraged to start nicotine patches + gum when ready to quit.   Patient Goals/Self-Care Activities Over the next 90 days, patient will:  - take medications as prescribed collaborate with provider on medication access solutions  Follow Up Plan: Telephone follow up appointment with care management team member scheduled for:~ 6 weeks      Medication Assistance:  Eliquisobtained through [employer]  medication assistance program.  Enrollment ends 11/24/21 . Breztri obtained through [employer] through 11/24/21  Patient's preferred pharmacy is:  [facility] - [place], [address] [place] [address] Phone: [phone] Fax: [phone]  [id number] [id number]: [phone]: [phone] Follow Up:  Patient agrees to Care Plan and Follow-up.  Plan: Telephone follow up appointment with care management team member scheduled for:  ~ 6 weeks  Catie TDarnelle Maffucci PharmD, BBlo[place] CBrowndellClinical Pharmacist LOccidental Petroleumat BJohnson & [phone]

## 2021-08-07 ENCOUNTER — Telehealth: Payer: Self-pay

## 2021-08-07 NOTE — Progress Notes (Signed)
Good Morning  Patient has been scheduled with LCSW, patient declined scheduling with RN CM at this time states that she does not feel the need right now.   Thank you  Noreene Larsson, [place], [place], [place] [address] Direct Dial: [phone] [email or web address]: [email or web address]

## 2021-08-07 NOTE — Chronic Care Management (AMB) (Signed)
  Chronic Care Management   Note  08/07/2021 Name: YVES STEGEMOLLER MRN: [id number] DOB: 1956-09-16  CEAIRA TAMBURRO is a 64 y.o. year old female who is a primary care patient of Einar Pheasant, MD. FELITA BRANON is currently enrolled in care management services. An additional referral for LCSW was placed.   Follow up plan: Telephone appointment with care management team member scheduled for:08/13/2021  Noreene Larsson, [place], [place], [place] [address] Direct Dial: [phone] [email or web address]: [email or web address]

## 2021-08-08 ENCOUNTER — Other Ambulatory Visit: Payer: Medicare HMO

## 2021-08-09 DIAGNOSIS — I2510 Atherosclerotic heart disease of native coronary artery without angina pectoris: Secondary | ICD-10-CM | POA: Diagnosis not present

## 2021-08-09 DIAGNOSIS — F419 Anxiety disorder, unspecified: Secondary | ICD-10-CM | POA: Diagnosis not present

## 2021-08-09 DIAGNOSIS — E1151 Type 2 diabetes mellitus with diabetic peripheral angiopathy without gangrene: Secondary | ICD-10-CM | POA: Diagnosis not present

## 2021-08-09 DIAGNOSIS — I872 Venous insufficiency (chronic) (peripheral): Secondary | ICD-10-CM | POA: Diagnosis not present

## 2021-08-09 DIAGNOSIS — I10 Essential (primary) hypertension: Secondary | ICD-10-CM | POA: Diagnosis not present

## 2021-08-09 DIAGNOSIS — D509 Iron deficiency anemia, unspecified: Secondary | ICD-10-CM | POA: Diagnosis not present

## 2021-08-09 DIAGNOSIS — F329 Major depressive disorder, single episode, unspecified: Secondary | ICD-10-CM | POA: Diagnosis not present

## 2021-08-09 DIAGNOSIS — Z471 Aftercare following joint replacement surgery: Secondary | ICD-10-CM | POA: Diagnosis not present

## 2021-08-09 DIAGNOSIS — J449 Chronic obstructive pulmonary disease, unspecified: Secondary | ICD-10-CM | POA: Diagnosis not present

## 2021-08-10 ENCOUNTER — Telehealth: Payer: Self-pay | Admitting: Internal Medicine

## 2021-08-10 MED ORDER — METFORMIN HCL ER 500 MG PO TB24: 1000.0000 mg | ORAL_TABLET | Freq: Two times a day (BID) | ORAL | 1 refills | Status: DC

## 2021-08-10 NOTE — Telephone Encounter (Signed)
Refill on metformin (GLUCOPHAGE-XR) 500 MG 24 hr tablet Send to Mail order [facility].

## 2021-08-13 ENCOUNTER — Ambulatory Visit: Payer: Medicare HMO | Admitting: *Deleted

## 2021-08-13 DIAGNOSIS — I2510 Atherosclerotic heart disease of native coronary artery without angina pectoris: Secondary | ICD-10-CM

## 2021-08-13 DIAGNOSIS — F330 Major depressive disorder, recurrent, mild: Secondary | ICD-10-CM

## 2021-08-13 DIAGNOSIS — J449 Chronic obstructive pulmonary disease, unspecified: Secondary | ICD-10-CM

## 2021-08-13 DIAGNOSIS — F320 Major depressive disorder, single episode, mild: Secondary | ICD-10-CM

## 2021-08-13 DIAGNOSIS — F3342 Major depressive disorder, recurrent, in full remission: Secondary | ICD-10-CM

## 2021-08-13 DIAGNOSIS — F418 Other specified anxiety disorders: Secondary | ICD-10-CM

## 2021-08-13 DIAGNOSIS — F32A Depression, unspecified: Secondary | ICD-10-CM

## 2021-08-13 DIAGNOSIS — M1612 Unilateral primary osteoarthritis, left hip: Secondary | ICD-10-CM

## 2021-08-13 NOTE — Patient Instructions (Signed)
Visit Information   PATIENT GOALS:   Goals Addressed               This Visit's Progress     Reduce and Manage My Symptoms of Anxiety and Depression. (pt-stated)   On track     Timeframe:  Short-Term Goal Priority:  High Start Date:  08/13/2021                           Expected End Date:  10/12/2021                   Follow-Up Date:  08/27/2021 at 9:00am  Patient Goals/Self-Care Activities: Begin personal counseling with LCSW on a weekly/bi-weekly basis, to reduce and manage symptoms of Anxiety and Depression, until established with [place]. Attend follow-up appointment with Dr. Ursula Alert, Psychiatrist with [facility] [phone]), scheduled on 08/28/2021 at 9:30am. Accept all calls from representative with [facility] [phone]), in an effort to establish ongoing mental health counseling and supportive services. Incorporate into daily practice - relaxation techniques, deep breathing exercises and mindfulness meditation strategies. Review list of Caregiver Support Groups and Grief and Loss Support Groups, e-mailed to you, and consider self-enrollment. Contact LCSW directly (# [phone]) if you have questions, need assistance, or if additional social work needs are identified between now and our next scheduled telephone outreach call.        Consent to CCM Services: Ms. Monda was given information about Chronic Care Management services including:  CCM service includes personalized support from designated clinical staff supervised by her physician, including individualized plan of care and coordination with other care providers 24/7 contact phone numbers for assistance for urgent and routine care needs. Service will only be billed when office clinical staff spend 20 minutes or more in a month to coordinate care. Only one practitioner may furnish and bill the service in a calendar month. The patient may stop CCM  services at any time (effective at the end of the month) by phone call to the office staff. The patient will be responsible for cost sharing (co-pay) of up to 20% of the service fee (after annual deductible is met).  Patient agreed to services and verbal consent obtained.   Patient verbalizes understanding of instructions provided today and agrees to view in [place].   Telephone follow up appointment with care management team member scheduled for:  08/27/2021 at [place] LCSW Licensed Clinical Social Worker [place]  [phone]   CLINICAL CARE PLAN: Patient Care Plan: [place] (Adult)     Problem Identified: COPD, CAD, HLD, HTN      Long-Range Goal: Disease Progression Prevention   This Visit's Progress: On track  Recent Progress: On track  Priority: High  Note:   Current Barriers:  Unable to independently afford treatment regimen Complex patient with multiple comorbidities including diabetes, coronary artery disease, COPD, depression/anxiety  Pharmacist Clinical Goal(s):  Over the next 90 days, patient will verbalize ability to afford treatment regimen. Over the next 90 days, patient will adhere to prescribed medication regimen  Interventions: 1:1 collaboration with Einar Pheasant, MD regarding development and update of comprehensive plan of care as evidenced by provider attestation and co-signature Inter-disciplinary care team collaboration (see longitudinal plan of care) Comprehensive medication review performed; medication list updated in electronic medical record  SDOH: Reports husband has transitioned to Hospice. Patient is overwhelmed by caring for her health and her husband's.  Sees Dr. Shea Evans but no longer has therapist. Discussed CCM support from RN CM and LCSW. Patient amenable.   Health Maintenance Yearly diabetic eye exam: up to date Yearly diabetic foot exam: up to date Urine microalbumin: up to date Yearly influenza  vaccination: due - recommend yearly influenza vaccine Td/Tdap vaccination: up to date Pneumonia vaccination: due - will discuss moving forward COVID vaccinations: due for bivalent booster Shingrix vaccinations: due- will discuss moving forward Colonoscopy: up to date Bone density scan: due - scheduled  Diabetes: Controlled; current treatment: metformin XR 1000 mg BID Previously recommended to continue current regimen at this time.   Hypertension, Coronary Artery Disease, new diagnosis of AFib Appropriately managed, follows w/ Dr. Nehemiah Massed, but plans to transition to Dr. Rockey Situ; current treatment:  Rate control: propranolol 40 mg daily  Anticoagulation: Eliquis 5 mg BID Additional antihypertensives/antianginals: amlodipine 5 mg daily, isosorbide 30 mg daily, telmisartan 80 mg daily Approved for Eliquis patient assistance through 11/24/21 Patient notes she self-decreased aspirin when she started Eliquis. Encouraged to discuss w/ Dr. Rockey Situ at upcoming appointment.   Hyperlipidemia, ASCVD Risk Reduction Controlled per last lipid panel; current treatment: atorvastatin 20 mg daily Antiplatelet therapy: none given concurrent anticoagulation Previously recommended to continue current regimen at this time  Chronic Obstructive Pulmonary Disease: Controlled; current treatment: Breztri 160/9/4.8 mcg 2 puffs BID; albuterol HFA PRN - 1-2 times daily due to wheezing and SOB; follows w/ Dr. Raul Del Receiving [place] through [facility] patient assistance Previously recommended to continue current regimen at this time along with pulmonology collaboration  Depression/Anxiety/Insomnia/Pain: Exacerbated by pain; follows w/ Dr. Shea Evans. Current regimen: lamotrigine 25 mg daily; duloxetine 60 mg daily, trazodone 200 mg QPM; gabapentin 300 mg QPM Discussed CCM LCSW as above. Continue collaboration with Dr. Shea Evans.   Pain, s/p recent hip replacement with subsequent fracture Uncontrolled; current treatment:  diclofenac 75 mg BID, cyclobenzaprine 5 mg up to TID for pain/muscle spasms - denies benefit, acetaminophen 500 mg BID PRN,  oxycodone 5 mg 1-2 tablets Q4H s/p hip replacement - though only taking ~ 2 doses daily as she is afraid she will run out and Dr. Roland Rack won't give her anymore Reports significant frustration with course of healing from hip replacement. Struggles getting around without a cane.  Discussed diclofenac in combination with Eliquis. Patient denies benefit from NSAID. Recommend to discontinue diclofenac and discuss alternative therapy with orthopedics.  RN CM referral as above.    Diarrhea, chronic with GERD: Moderately well controlled per patient report; Current regimen: taking imodium daily from OTC insurance benefits; pantoprazole 40 mg BID with famotidine 40 mg daily, dicyclomine 10 mg PRN  Previously encouraged to continue to collaborate with GI to determine the best long term plan for management of chronic diarrhea.   Overactive Bladder: Managed per patient report; current regimen: oxybutynin XL 15 mg daily, follows w/ Dr. Bernardo Heater; also placed on chronic trimethoprim 100 mg daily for UTI ppx. Cytoscopy upcoming.  Previously on tolterodine, beneficial at first but then waned over time Myrbetriq too expensive on her insurance Previously recommended to continue current regimen and collaboration w/ urology  Tobacco Abuse: 1-1.5 packs per day; 50 years of use Previous quit attempts: unsuccessful using varenicline (nightmares) Triggers to smoke: stress, familial stress lately as well as her own health concerns.  Previously encouraged to start nicotine patches + gum when ready to quit.   Patient Goals/Self-Care Activities Over the next 90 days, patient will:  - take medications as prescribed collaborate with provider on medication access solutions  Follow Up Plan: Telephone follow up appointment with care management team member scheduled for:~ 6 weeks     Patient Care  Plan: LCSW Plan of Care     Problem Identified: Reduce and Manage My Symptoms of Anxiety and Depression.   Priority: High     Goal: Reduce and Manage My Symptoms of Anxiety and Depression.   Start Date: 08/13/2021  Expected End Date: 10/12/2021  This Visit's Progress: On track  Priority: High  Note:   Current Barriers:   Acute Mental Health needs related to Major Depressive Disorder, Anxiety and Caregiver Stress requires Support, Education, Resources, Referrals and Care Coordination in order to meet unmet mental health needs. Clinical Goal(s):  Patient will work with LCSW to reduce and manage symptoms of Anxiety and Depression, until established with a community provider.   Patient will increase knowledge and/or ability of:        Coping Skills, Healthy Habits, Self-Management Skills, Stress Reduction, Home Safety and Utilizing [employer] and Resources.   Clinical Interventions:  Assessed patient's previous treatment, needs, coping skills, current treatment, support system and barriers to care. PHQ-2 and PHQ-9 Depression Screening Tool performed and results reviewed with patient. Other interventions included: Solution-Focused Therapy Performed, Mindfulness Meditation Strategies, Relaxation Techniques and Deep Breathing Exercises Encouraged, Active Listening/Reflection Utilized, Emotional Support Provided, Brief Cognitive Behavioral Therapy Initiated, Reviewed Mental Health Medications and Discussed Compliance, Quality of Sleep Assessed and Sleep Hygiene Techniques Promoted, Support Group Participation Encouraged, Increase Level of Activity/Exercise, Verbalization of Feelings Encouraged, Suicidal Ideation/Homicidal Ideation Assessed - None Present.   Provided mental health counseling with regards to Anxiety and Depression.      Discussed plans with patient for ongoing care management follow-up and provided patient with direct contact information for care management team. Discussed  several options for long-term counseling based on need and insurance, and verbal consent obtained to place a referral to [facility] for ongoing mental health counseling and supportive services.  Collaboration with Primary Care Physician, Dr. Betty Martinique regarding development and update of comprehensive plan of care as evidenced by provider attestation and co-signature. Inter-disciplinary care team collaboration (see longitudinal plan of care). Patient Goals/Self-Care Activities: Begin personal counseling with LCSW on a weekly/bi-weekly basis, to reduce and manage symptoms of Anxiety and Depression, until established with [place]. Attend follow-up appointment with Dr. Ursula Alert, Psychiatrist with [facility] [phone]), scheduled on 08/28/2021 at 9:30am. Accept all calls from representative with [facility] [phone]), in an effort to establish ongoing mental health counseling and supportive services. Incorporate into daily practice - relaxation techniques, deep breathing exercises and mindfulness meditation strategies. Review list of Caregiver Support Groups and Grief and Loss Support Groups, e-mailed to you, and consider self-enrollment. Contact LCSW directly (# [phone]) if you have questions, need assistance, or if additional social work needs are identified between now and our next scheduled telephone outreach call. Follow-Up:  08/27/2021 at 9:00am

## 2021-08-13 NOTE — Chronic Care Management (AMB) (Signed)
Chronic Care Management    Clinical Social Work Note  08/13/2021 Name: Kathryn Friedman MRN: [id number] DOB: [birth year]  Kathryn Friedman is a 65 y.o. year old female who is a primary care patient of Einar Pheasant, MD. The CCM team was consulted to assist the patient with chronic disease management and/or care coordination needs related to: Mental Health Counseling and Resources, Grief Counseling, and Caregiver Stress.   Engaged with patient by telephone for initial visit in response to provider referral for social work chronic care management and care coordination services.   Consent to Services:  The patient was given information about Chronic Care Management services, agreed to services, and gave verbal consent prior to initiation of services.  Please see initial visit note for detailed documentation.   Patient agreed to services and consent obtained.   Assessment: Review of patient past medical history, allergies, medications, and health status, including review of relevant consultants reports was performed today as part of a comprehensive evaluation and provision of chronic care management and care coordination services.     SDOH (Social Determinants of Health) assessments and interventions performed:  SDOH Interventions    Flowsheet Row Most Recent Value  SDOH Interventions   Food Insecurity Interventions Intervention Not Indicated  Financial Strain Interventions Intervention Not Indicated, Other (Comment)  [Pharmacy Involvement]  Housing Interventions Intervention Not Indicated  Intimate Partner Violence Interventions Intervention Not Indicated  Physical Activity Interventions Intervention Not Indicated  Stress Interventions Intervention Not Indicated, Provide Counseling, Offered [employer], Other (Comment)  [Currently established with psychiatrist.  Referral placed for counseling with a therapist through [place]  Interventions Intervention Not Indicated  Transportation Interventions Intervention Not Indicated        Advanced Directives Status: See Care Plan for related entries.  CCM Care Plan  Allergies  Allergen Reactions   Varenicline Other (See Comments)    "I got really depressed" "I got really depressed" CHANTEX   Jardiance [Empagliflozin] Other (See Comments)    Yeast infection   Methylprednisolone Nausea Only and Nausea And Vomiting    Outpatient Encounter Medications as of 08/13/2021  Medication Sig Note   acetaminophen (TYLENOL) 325 MG tablet Take 650 mg by mouth every 6 (six) hours as needed.    albuterol (VENTOLIN HFA) 108 (90 Base) MCG/ACT inhaler INHALE 2 PUFFS FOUR TIMES A DAY    amLODipine (NORVASC) 5 MG tablet Take 1 tablet (5 mg total) by mouth daily.    apixaban (ELIQUIS) 5 MG TABS tablet Take 1 tablet (5 mg total) by mouth 2 (two) times daily.    Blood Glucose Monitoring Suppl (TRUE METRIX METER) w/Device KIT     Budeson-Glycopyrrol-Formoterol (BREZTRI AEROSPHERE) 160-9-4.8 MCG/ACT AERO Inhale 2 puffs into the lungs in the morning and at bedtime.    CALCIUM PO Take 600 mg by mouth daily.     cyclobenzaprine (FLEXERIL) 10 MG tablet TAKE 1 TABLET THREE TIMES DAILY AS NEEDED FOR MUSCLE SPASM(S)    diclofenac (VOLTAREN) 75 MG EC tablet Take 75 mg by mouth 2 (two) times daily.    dicyclomine (BENTYL) 20 MG tablet Take 20 mg by mouth every 6 (six) hours as needed. (Patient not taking: Reported on 08/06/2021)    DULoxetine (CYMBALTA) 60 MG capsule Take 1 capsule (60 mg total) by mouth daily.    famotidine (PEPCID) 40 MG tablet TAKE 1 TABLET (40 MG TOTAL) BY MOUTH DAILY.    gabapentin (NEURONTIN) 300 MG capsule Take 1 capsule (300 mg total) by  mouth at bedtime.    isosorbide mononitrate (IMDUR) 30 MG 24 hr tablet TAKE 1 TABLET EVERY DAY    L-FORMULA LYSINE HCL PO Take 1,000 mg by mouth daily.    lamoTRIgine (LAMICTAL) 25 MG tablet Take 1 tablet (25 mg total) by mouth daily. TAKE  1 TABLET EVERY DAY FOR MOOD    metFORMIN (GLUCOPHAGE-XR) 500 MG 24 hr tablet Take 2 tablets (1,000 mg total) by mouth in the morning and at bedtime.    Multiple Vitamin (MULTIVITAMIN PO) Take 1 tablet by mouth daily.    nystatin (MYCOSTATIN/NYSTOP) powder Apply topically 2 (two) times daily. to affected area(s)    oxybutynin (DITROPAN XL) 15 MG 24 hr tablet TAKE 1 TABLET (15 MG TOTAL) BY MOUTH DAILY.    oxyCODONE (OXY IR/ROXICODONE) 5 MG immediate release tablet Take 1-2 tablets (5-10 mg total) by mouth every 4 (four) hours as needed for moderate pain (pain score 4-6). 08/06/2021: 1-2 daily   pantoprazole (PROTONIX) 40 MG tablet TAKE 1 TABLET TWICE DAILY BEFORE MEALS    Potassium 99 MG TABS Take 99 mg by mouth daily.    propranolol (INDERAL) 40 MG tablet 1 tab daily PO    rosuvastatin (CRESTOR) 20 MG tablet Take 1 tablet (20 mg total) by mouth daily.    telmisartan (MICARDIS) 80 MG tablet Take 1 tablet (80 mg total) by mouth daily.    traZODone (DESYREL) 100 MG tablet Take 2 tablets (200 mg total) by mouth at bedtime.    trimethoprim (TRIMPEX) 100 MG tablet Take 1 tablet (100 mg total) by mouth daily. (Patient not taking: Reported on 08/06/2021)    TRUE METRIX BLOOD GLUCOSE TEST test strip USE AS INSTRUCTED TO CHECK BLOOD SUGARS TWICE DAILY.    No facility-administered encounter medications on file as of 08/13/2021.    Patient Active Problem List   Diagnosis Date Noted   Close exposure to COVID-19 virus 07/15/2021   Electrolyte disorder 07/01/2021   Closed nondisplaced fracture of greater trochanter of left femur ([place]) 06/22/2021   Hip fx ([place]) 06/19/2021   Status post total hip replacement, left 06/12/2021   Paroxysmal A-fib ([place]) 06/07/2021   Primary osteoarthritis of left hip 05/18/2021   Moderate episode of recurrent major depressive disorder ([place]) 04/05/2021   Aortic atherosclerosis ([place]) 04/03/2021   SOBOE (shortness of breath on exertion) 01/16/2021   Thrush 12/27/2020   Suspected  COVID-19 virus infection 12/20/2020   MDD (major depressive disorder), recurrent episode, mild ([place]) 11/16/2020   Abdominal wall abscess 07/30/2020   Skin lesion 07/23/2020   Boil 07/20/2020   MDD (major depressive disorder), recurrent, in full remission ([place]) 06/26/2020   Insomnia due to medical condition 06/26/2020   Anxiety disorder 06/26/2020   Cough 04/03/2020   Urge incontinence 03/02/2020   Urinary frequency 02/06/2020   Bilateral carotid artery stenosis 10/15/2019   PSVT (paroxysmal supraventricular tachycardia) ([place]) 08/09/2019   History of migraine headaches 05/10/2019   Lower abdominal pain 03/21/2019   Dysphagia 09/15/2018   ILD (interstitial lung disease) ([place]) 08/17/2017   Tobacco use disorder 08/11/2016   Venous insufficiency of both lower extremities 07/10/2016   Healthcare maintenance 06/18/2016   Lump in the abdomen 08/06/2015   Dizziness 08/06/2015   Fatty infiltration of liver 08/11/2014   Blood in the stool 08/11/2014   Acute diarrhea 08/11/2014   Hemorrhage of gastrointestinal tract 08/07/2014   GERD (gastroesophageal reflux disease) 10/20/2013   Iron deficiency anemia 10/20/2013   Nausea with vomiting 10/19/2013   LUQ pain 10/19/2013  Diarrhea 09/26/2013   Anemia 04/12/2013   CAD (coronary artery disease) 02/18/2013   COPD (chronic obstructive pulmonary disease) ([place]) 02/18/2013   Obstructive sleep apnea 02/18/2013   Mild depression ([place]) 02/18/2013   Diverticulosis 02/18/2013   Diabetes ([place]) 02/17/2013   Essential hypertension, benign 02/17/2013   Hypercholesterolemia 02/17/2013    Conditions to be addressed/monitored: Anxiety and Depression.  Limited Social Support, Mental Health Concerns, Social Isolation, Limited Access to Caregiver, and Lacks Knowledge of [employer].  Care Plan : LCSW Plan of Care  Updates made by Francis Gaines, LCSW since 08/13/2021 12:00 AM     Problem: Reduce and Manage My Symptoms of Anxiety and  Depression.   Priority: High     Goal: Reduce and Manage My Symptoms of Anxiety and Depression.   Start Date: 08/13/2021  Expected End Date: 10/12/2021  This Visit's Progress: On track  Priority: High  Note:   Current Barriers:   Acute Mental Health needs related to Major Depressive Disorder, Anxiety and Caregiver Stress requires Support, Education, Resources, Referrals and Care Coordination in order to meet unmet mental health needs. Clinical Goal(s):  Patient will work with LCSW to reduce and manage symptoms of Anxiety and Depression, until established with a community provider.   Patient will increase knowledge and/or ability of:        Coping Skills, Healthy Habits, Self-Management Skills, Stress Reduction, Home Safety and Utilizing [employer] and Resources.   Clinical Interventions:  Assessed patient's previous treatment, needs, coping skills, current treatment, support system and barriers to care. PHQ-2 and PHQ-9 Depression Screening Tool performed and results reviewed with patient. Other interventions included: Solution-Focused Therapy Performed, Mindfulness Meditation Strategies, Relaxation Techniques and Deep Breathing Exercises Encouraged, Active Listening/Reflection Utilized, Emotional Support Provided, Brief Cognitive Behavioral Therapy Initiated, Reviewed Mental Health Medications and Discussed Compliance, Quality of Sleep Assessed and Sleep Hygiene Techniques Promoted, Support Group Participation Encouraged, Increase Level of Activity/Exercise, Verbalization of Feelings Encouraged, Suicidal Ideation/Homicidal Ideation Assessed - None Present.   Provided mental health counseling with regards to Anxiety and Depression.      Discussed plans with patient for ongoing care management follow-up and provided patient with direct contact information for care management team. Discussed several options for long-term counseling based on need and insurance, and verbal consent obtained  to place a referral to [facility] for ongoing mental health counseling and supportive services.  Collaboration with Primary Care Physician, Dr. Betty Martinique regarding development and update of comprehensive plan of care as evidenced by provider attestation and co-signature. Inter-disciplinary care team collaboration (see longitudinal plan of care). Patient Goals/Self-Care Activities: Begin personal counseling with LCSW on a weekly/bi-weekly basis, to reduce and manage symptoms of Anxiety and Depression, until established with [place]. Attend follow-up appointment with Dr. Ursula Alert, Psychiatrist with [facility] [phone]), scheduled on 08/28/2021 at 9:30am. Accept all calls from representative with [facility] [phone]), in an effort to establish ongoing mental health counseling and supportive services. Incorporate into daily practice - relaxation techniques, deep breathing exercises and mindfulness meditation strategies. Review list of Caregiver Support Groups and Grief and Loss Support Groups, e-mailed to you, and consider self-enrollment. Contact LCSW directly (# [phone]) if you have questions, need assistance, or if additional social work needs are identified between now and our next scheduled telephone outreach call. Follow-Up:  08/27/2021 at [place]:  08/27/2021 at [place] Social Worker  [place]  [phone]

## 2021-08-14 DIAGNOSIS — M48062 Spinal stenosis, lumbar region with neurogenic claudication: Secondary | ICD-10-CM | POA: Diagnosis not present

## 2021-08-14 DIAGNOSIS — M5416 Radiculopathy, lumbar region: Secondary | ICD-10-CM | POA: Diagnosis not present

## 2021-08-21 ENCOUNTER — Other Ambulatory Visit: Payer: Self-pay | Admitting: Internal Medicine

## 2021-08-24 ENCOUNTER — Ambulatory Visit: Payer: Medicare HMO | Admitting: Cardiovascular Disease

## 2021-08-24 DIAGNOSIS — I10 Essential (primary) hypertension: Secondary | ICD-10-CM | POA: Diagnosis not present

## 2021-08-24 DIAGNOSIS — M1612 Unilateral primary osteoarthritis, left hip: Secondary | ICD-10-CM | POA: Diagnosis not present

## 2021-08-24 DIAGNOSIS — F3342 Major depressive disorder, recurrent, in full remission: Secondary | ICD-10-CM | POA: Diagnosis not present

## 2021-08-24 DIAGNOSIS — I480 Paroxysmal atrial fibrillation: Secondary | ICD-10-CM

## 2021-08-24 DIAGNOSIS — F330 Major depressive disorder, recurrent, mild: Secondary | ICD-10-CM | POA: Diagnosis not present

## 2021-08-24 DIAGNOSIS — F320 Major depressive disorder, single episode, mild: Secondary | ICD-10-CM | POA: Diagnosis not present

## 2021-08-24 DIAGNOSIS — E1159 Type 2 diabetes mellitus with other circulatory complications: Secondary | ICD-10-CM | POA: Diagnosis not present

## 2021-08-24 DIAGNOSIS — I2510 Atherosclerotic heart disease of native coronary artery without angina pectoris: Secondary | ICD-10-CM | POA: Diagnosis not present

## 2021-08-24 DIAGNOSIS — J449 Chronic obstructive pulmonary disease, unspecified: Secondary | ICD-10-CM | POA: Diagnosis not present

## 2021-08-27 ENCOUNTER — Ambulatory Visit (INDEPENDENT_AMBULATORY_CARE_PROVIDER_SITE_OTHER): Payer: Medicare HMO | Admitting: *Deleted

## 2021-08-27 ENCOUNTER — Ambulatory Visit: Payer: Medicare HMO

## 2021-08-27 DIAGNOSIS — F418 Other specified anxiety disorders: Secondary | ICD-10-CM

## 2021-08-27 DIAGNOSIS — F32A Depression, unspecified: Secondary | ICD-10-CM

## 2021-08-27 DIAGNOSIS — F3342 Major depressive disorder, recurrent, in full remission: Secondary | ICD-10-CM

## 2021-08-27 DIAGNOSIS — I2510 Atherosclerotic heart disease of native coronary artery without angina pectoris: Secondary | ICD-10-CM

## 2021-08-27 DIAGNOSIS — E1159 Type 2 diabetes mellitus with other circulatory complications: Secondary | ICD-10-CM

## 2021-08-27 DIAGNOSIS — F330 Major depressive disorder, recurrent, mild: Secondary | ICD-10-CM

## 2021-08-27 DIAGNOSIS — I471 Supraventricular tachycardia: Secondary | ICD-10-CM

## 2021-08-27 DIAGNOSIS — M1612 Unilateral primary osteoarthritis, left hip: Secondary | ICD-10-CM

## 2021-08-27 NOTE — Chronic Care Management (AMB) (Signed)
Chronic Care Management    Clinical Social Work Note  08/27/2021 Name: Kathryn Friedman MRN: [id number] DOB: [birth year]  Kathryn Friedman is a 65 y.o. year old female who is a primary care patient of Einar Pheasant, MD. The CCM team was consulted to assist the patient with chronic disease management and/or care coordination needs related to: [place] and Resources and Caregiver Stress.   Engaged with patient by telephone for follow-up visit in response to provider referral for social work chronic care management and care coordination services.   Consent to Services:  The patient was given information about Chronic Care Management services, agreed to services, and gave verbal consent prior to initiation of services.  Please see initial visit note for detailed documentation.   Patient agreed to services and consent obtained.   Assessment: Review of patient past medical history, allergies, medications, and health status, including review of relevant consultants reports was performed today as part of a comprehensive evaluation and provision of chronic care management and care coordination services.     SDOH (Social Determinants of Health) assessments and interventions performed:    Advanced Directives Status: Not addressed in this encounter.  CCM Care Plan  Allergies  Allergen Reactions   Varenicline Other (See Comments)    "I got really depressed" "I got really depressed" CHANTEX   Jardiance [Empagliflozin] Other (See Comments)    Yeast infection   Methylprednisolone Nausea Only and Nausea And Vomiting    Outpatient Encounter Medications as of 08/27/2021  Medication Sig Note   acetaminophen (TYLENOL) 325 MG tablet Take 650 mg by mouth every 6 (six) hours as needed.    albuterol (VENTOLIN HFA) 108 (90 Base) MCG/ACT inhaler INHALE 2 PUFFS FOUR TIMES A DAY    amLODipine (NORVASC) 5 MG tablet Take 1 tablet (5 mg total) by mouth daily.    apixaban  (ELIQUIS) 5 MG TABS tablet Take 1 tablet (5 mg total) by mouth 2 (two) times daily.    Blood Glucose Monitoring Suppl (TRUE METRIX METER) w/Device KIT     Budeson-Glycopyrrol-Formoterol (BREZTRI AEROSPHERE) 160-9-4.8 MCG/ACT AERO Inhale 2 puffs into the lungs in the morning and at bedtime.    CALCIUM PO Take 600 mg by mouth daily.     cyclobenzaprine (FLEXERIL) 10 MG tablet TAKE 1 TABLET THREE TIMES DAILY AS NEEDED FOR MUSCLE SPASM(S)    diclofenac (VOLTAREN) 75 MG EC tablet Take 75 mg by mouth 2 (two) times daily.    dicyclomine (BENTYL) 20 MG tablet Take 20 mg by mouth every 6 (six) hours as needed. (Patient not taking: Reported on 08/06/2021)    DULoxetine (CYMBALTA) 60 MG capsule Take 1 capsule (60 mg total) by mouth daily.    famotidine (PEPCID) 40 MG tablet TAKE 1 TABLET (40 MG TOTAL) BY MOUTH DAILY.    gabapentin (NEURONTIN) 300 MG capsule Take 1 capsule (300 mg total) by mouth at bedtime.    isosorbide mononitrate (IMDUR) 30 MG 24 hr tablet TAKE 1 TABLET EVERY DAY    L-FORMULA LYSINE HCL PO Take 1,000 mg by mouth daily.    lamoTRIgine (LAMICTAL) 25 MG tablet Take 1 tablet (25 mg total) by mouth daily. TAKE 1 TABLET EVERY DAY FOR MOOD    metFORMIN (GLUCOPHAGE-XR) 500 MG 24 hr tablet Take 2 tablets (1,000 mg total) by mouth in the morning and at bedtime.    Multiple Vitamin (MULTIVITAMIN PO) Take 1 tablet by mouth daily.    nystatin (MYCOSTATIN/NYSTOP) powder Apply topically 2 (two) times daily.  to affected area(s)    oxybutynin (DITROPAN XL) 15 MG 24 hr tablet TAKE 1 TABLET (15 MG TOTAL) BY MOUTH DAILY.    oxyCODONE (OXY IR/ROXICODONE) 5 MG immediate release tablet Take 1-2 tablets (5-10 mg total) by mouth every 4 (four) hours as needed for moderate pain (pain score 4-6). 08/06/2021: 1-2 daily   pantoprazole (PROTONIX) 40 MG tablet TAKE 1 TABLET TWICE DAILY BEFORE MEALS    Potassium 99 MG TABS Take 99 mg by mouth daily.    propranolol (INDERAL) 40 MG tablet 1 tab daily PO    rosuvastatin  (CRESTOR) 20 MG tablet TAKE 1 TABLET EVERY DAY    telmisartan (MICARDIS) 80 MG tablet Take 1 tablet (80 mg total) by mouth daily.    traZODone (DESYREL) 100 MG tablet Take 2 tablets (200 mg total) by mouth at bedtime.    trimethoprim (TRIMPEX) 100 MG tablet Take 1 tablet (100 mg total) by mouth daily. (Patient not taking: Reported on 08/06/2021)    TRUE METRIX BLOOD GLUCOSE TEST test strip USE AS INSTRUCTED TO CHECK BLOOD SUGARS TWICE DAILY.    No facility-administered encounter medications on file as of 08/27/2021.    Patient Active Problem List   Diagnosis Date Noted   Close exposure to COVID-19 virus 07/15/2021   Electrolyte disorder 07/01/2021   Closed nondisplaced fracture of greater trochanter of left femur ([place]) 06/22/2021   Hip fx ([place]) 06/19/2021   Status post total hip replacement, left 06/12/2021   Paroxysmal A-fib ([place]) 06/07/2021   Primary osteoarthritis of left hip 05/18/2021   Moderate episode of recurrent major depressive disorder ([place]) 04/05/2021   Aortic atherosclerosis ([place]) 04/03/2021   SOBOE (shortness of breath on exertion) 01/16/2021   Thrush 12/27/2020   Suspected COVID-19 virus infection 12/20/2020   MDD (major depressive disorder), recurrent episode, mild ([place]) 11/16/2020   Abdominal wall abscess 07/30/2020   Skin lesion 07/23/2020   Boil 07/20/2020   MDD (major depressive disorder), recurrent, in full remission ([place]) 06/26/2020   Insomnia due to medical condition 06/26/2020   Anxiety disorder 06/26/2020   Cough 04/03/2020   Urge incontinence 03/02/2020   Urinary frequency 02/06/2020   Bilateral carotid artery stenosis 10/15/2019   PSVT (paroxysmal supraventricular tachycardia) ([place]) 08/09/2019   History of migraine headaches 05/10/2019   Lower abdominal pain 03/21/2019   Dysphagia 09/15/2018   ILD (interstitial lung disease) ([place]) 08/17/2017   Tobacco use disorder 08/11/2016   Venous insufficiency of both lower extremities 07/10/2016   Healthcare  maintenance 06/18/2016   Lump in the abdomen 08/06/2015   Dizziness 08/06/2015   Fatty infiltration of liver 08/11/2014   Blood in the stool 08/11/2014   Acute diarrhea 08/11/2014   Hemorrhage of gastrointestinal tract 08/07/2014   GERD (gastroesophageal reflux disease) 10/20/2013   Iron deficiency anemia 10/20/2013   Nausea with vomiting 10/19/2013   LUQ pain 10/19/2013   Diarrhea 09/26/2013   Anemia 04/12/2013   CAD (coronary artery disease) 02/18/2013   COPD (chronic obstructive pulmonary disease) ([place]) 02/18/2013   Obstructive sleep apnea 02/18/2013   Mild depression 02/18/2013   Diverticulosis 02/18/2013   Diabetes ([place]) 02/17/2013   Essential hypertension, benign 02/17/2013   Hypercholesterolemia 02/17/2013    Conditions to be addressed/monitored: Anxiety and Depression.  Mental Health Concerns and Limited Access to Caregiver.  Care Plan : LCSW Plan of Care  Updates made by Francis Gaines, LCSW since 08/27/2021 12:00 AM     Problem: Reduce and Manage My Symptoms of Anxiety and Depression. Resolved 08/27/2021  Priority: High     Goal: Reduce and Manage My Symptoms of Anxiety and Depression. Completed 08/27/2021  Start Date: 08/13/2021  Expected End Date: 08/27/2021  This Visit's Progress: On track  Recent Progress: On track  Priority: High  Note:   Current Barriers:   Acute Mental Health needs related to Major Depressive Disorder, Anxiety and Caregiver Stress requires Support, Education, Resources, Referrals and Care Coordination in order to meet unmet mental health needs. Clinical Goal(s):   Patient will increase knowledge and/or ability of:  Coping Skills, Healthy Habits, Self-Management Skills, Stress Reduction, Home Safety and Utilizing [employer] and Resources.   Clinical Interventions:  Emotional Support Provided, Brief Cognitive Behavioral Therapy Initiated, Support Group Participation Encouraged and Verbalization of Feelings Encouraged.   Discussed  several options for long-term counseling based on need and insurance, now that patient has declined ongoing mental health counseling and supportive services through [facility].  Patient Goals/Self-Care Activities: Attend follow-up appointment with Dr. Ursula Alert, Psychiatrist with [facility] [phone]), scheduled on 08/28/2021 at 9:30am, and continue to receive ongoing mental health counseling and supportive services. Continue to consider self-enrollment in a Caregiver Support Group, and/or Grief and Loss Support Group, from the lists provided.   Contact LCSW directly (# [phone]) if you have questions, need assistance, if additional social work needs are identified in the near future, or if you change your mind about wanting to receive counseling and supportive services through [facility]CS[facility].   LCSW collaboration with [place] to explain that you have declined services at this time. Follow-Up:  No Follow-Up Required, Per Patient     Nat Christen LCSW Licensed Clinical Social Worker [place]  [phone]

## 2021-08-27 NOTE — Patient Instructions (Signed)
Visit Information  PATIENT GOALS:  Goals Addressed               This Visit's Progress     COMPLETED: Reduce and Manage My Symptoms of Anxiety and Depression. (pt-stated)   On track     Timeframe:  Short-Term Goal Priority:  High Start Date:  08/13/2021                           Expected End Date:  08/27/2021                   Follow-Up Date:  No Follow-Up Required, Per Patient.  Patient Goals/Self-Care Activities: Attend follow-up appointment with Dr. Ursula Alert, Psychiatrist with [facility] [phone]), scheduled on 08/28/2021 at 9:30am, and continue to receive ongoing mental health counseling and supportive services. Continue to consider self-enrollment in a Caregiver Support Group, and/or Grief and Loss Support Group, from the lists provided.   Contact LCSW directly (# [phone]) if you have questions, need assistance, if additional social work needs are identified in the near future, or if you change your mind about wanting to receive counseling and supportive services through [facility]CSW.   LCSW collaboration with [place] to explain that you have declined services at this time.        Patient verbalizes understanding of instructions provided today and agrees to view in [place].   No Follow-Up Required, Per Patient.  Nat Christen LCSW Licensed Clinical Social Worker [place]  [phone]

## 2021-08-28 ENCOUNTER — Telehealth (INDEPENDENT_AMBULATORY_CARE_PROVIDER_SITE_OTHER): Payer: Medicare HMO | Admitting: Psychiatry

## 2021-08-28 ENCOUNTER — Encounter: Payer: Self-pay | Admitting: Psychiatry

## 2021-08-28 ENCOUNTER — Other Ambulatory Visit: Payer: Self-pay

## 2021-08-28 DIAGNOSIS — F418 Other specified anxiety disorders: Secondary | ICD-10-CM | POA: Diagnosis not present

## 2021-08-28 DIAGNOSIS — F17200 Nicotine dependence, unspecified, uncomplicated: Secondary | ICD-10-CM | POA: Diagnosis not present

## 2021-08-28 DIAGNOSIS — F330 Major depressive disorder, recurrent, mild: Secondary | ICD-10-CM

## 2021-08-28 DIAGNOSIS — G4701 Insomnia due to medical condition: Secondary | ICD-10-CM | POA: Diagnosis not present

## 2021-08-28 MED ORDER — DOXEPIN HCL 10 MG PO CAPS: 10.0000 mg | ORAL_CAPSULE | Freq: Every evening | ORAL | 1 refills | Status: DC | PRN
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
## 2021-09-02 ENCOUNTER — Other Ambulatory Visit: Payer: Self-pay | Admitting: Urology

## 2021-09-03 ENCOUNTER — Ambulatory Visit (INDEPENDENT_AMBULATORY_CARE_PROVIDER_SITE_OTHER): Payer: Medicare HMO | Admitting: Urology

## 2021-09-03 ENCOUNTER — Other Ambulatory Visit: Payer: Self-pay

## 2021-09-03 ENCOUNTER — Encounter: Payer: Self-pay | Admitting: Urology

## 2021-09-03 ENCOUNTER — Telehealth: Payer: Self-pay

## 2021-09-03 VITALS — BP 159/89 | HR 94 | Ht 64.0 in | Wt 218.0 lb

## 2021-09-03 DIAGNOSIS — G4701 Insomnia due to medical condition: Secondary | ICD-10-CM

## 2021-09-03 DIAGNOSIS — F418 Other specified anxiety disorders: Secondary | ICD-10-CM

## 2021-09-03 DIAGNOSIS — N3281 Overactive bladder: Secondary | ICD-10-CM

## 2021-09-03 DIAGNOSIS — N3946 Mixed incontinence: Secondary | ICD-10-CM | POA: Diagnosis not present

## 2021-09-03 LAB — URINALYSIS, COMPLETE
Bilirubin, UA: NEGATIVE
Glucose, UA: NEGATIVE
Ketones, UA: NEGATIVE
Leukocytes,UA: NEGATIVE
Nitrite, UA: NEGATIVE
Protein,UA: NEGATIVE
RBC, UA: NEGATIVE
Specific Gravity, UA: 1.01 (ref 1.005–1.030)
Urobilinogen, Ur: 0.2 mg/dL (ref 0.2–1.0)
pH, UA: 6 (ref 5.0–7.5)

## 2021-09-03 LAB — MICROSCOPIC EXAMINATION: RBC, Urine: NONE SEEN /hpf (ref 0–2)

## 2021-09-03 MED ORDER — GEMTESA 75 MG PO TABS: 75.0000 mg | ORAL_TABLET | Freq: Every day | ORAL | 0 refills | Status: DC

## 2021-09-03 MED ORDER — GABAPENTIN 300 MG PO CAPS: 300.0000 mg | ORAL_CAPSULE | Freq: Every day | ORAL | 1 refills | Status: DC
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
## 2021-09-03 NOTE — Telephone Encounter (Signed)
Pt called requesting a refill on the gabapentin

## 2021-09-03 NOTE — Telephone Encounter (Signed)
received fax requesting a refill o the gabapentin

## 2021-09-03 NOTE — Telephone Encounter (Signed)
I have sent gabapentin to pharmacy.

## 2021-09-05 ENCOUNTER — Ambulatory Visit: Payer: Medicare HMO | Admitting: Cardiovascular Disease

## 2021-09-05 ENCOUNTER — Other Ambulatory Visit: Payer: Self-pay

## 2021-09-05 ENCOUNTER — Telehealth (INDEPENDENT_AMBULATORY_CARE_PROVIDER_SITE_OTHER): Payer: Medicare HMO | Admitting: Psychiatry

## 2021-09-05 ENCOUNTER — Encounter: Payer: Self-pay | Admitting: Psychiatry

## 2021-09-05 DIAGNOSIS — F17200 Nicotine dependence, unspecified, uncomplicated: Secondary | ICD-10-CM | POA: Diagnosis not present

## 2021-09-05 DIAGNOSIS — F3341 Major depressive disorder, recurrent, in partial remission: Secondary | ICD-10-CM

## 2021-09-05 DIAGNOSIS — G4701 Insomnia due to medical condition: Secondary | ICD-10-CM | POA: Diagnosis not present

## 2021-09-05 DIAGNOSIS — F418 Other specified anxiety disorders: Secondary | ICD-10-CM | POA: Diagnosis not present

## 2021-09-05 DIAGNOSIS — F330 Major depressive disorder, recurrent, mild: Secondary | ICD-10-CM

## 2021-09-05 MED ORDER — ESZOPICLONE 1 MG PO TABS: 1.0000 mg | ORAL_TABLET | Freq: Every evening | ORAL | 0 refills | Status: DC | PRN
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
## 2021-09-11 ENCOUNTER — Ambulatory Visit: Payer: Medicare HMO | Admitting: Pharmacist

## 2021-09-11 DIAGNOSIS — E1159 Type 2 diabetes mellitus with other circulatory complications: Secondary | ICD-10-CM

## 2021-09-11 DIAGNOSIS — E7800 Pure hypercholesterolemia, unspecified: Secondary | ICD-10-CM

## 2021-09-11 DIAGNOSIS — I10 Essential (primary) hypertension: Secondary | ICD-10-CM

## 2021-09-11 DIAGNOSIS — R350 Frequency of micturition: Secondary | ICD-10-CM

## 2021-09-11 NOTE — Patient Instructions (Signed)
Visit Information  PATIENT GOALS:  Goals Addressed               This Visit's Progress     Patient Stated     Medication Monitoring (pt-stated)        Patient Goals/Self-Care Activities Over the next 90 days, patient will:  - take medications as prescribed collaborate with provider on medication access solutions         Patient verbalizes understanding of instructions provided today and agrees to view in [place].   Plan: Telephone follow up appointment with care management team member scheduled for:  6 weeks  [name], PharmD, [place], [place] Clinical Pharmacist [employer] [facility] [employer] [phone]

## 2021-09-11 NOTE — Chronic Care Management (AMB) (Signed)
Chronic Care Management Pharmacy Note  09/11/2021 Name:  Kathryn Friedman MRN:  [id number] DOB:  May 10, 1956 Subjective: Kathryn Friedman is an 65 y.o. year old female who is a primary patient of Einar Pheasant, MD.  The CCM team was consulted for assistance with disease management and care coordination needs.    Engaged with patient by telephone for  medication access  in response to provider referral for pharmacy case management and/or care coordination services.   Consent to Services:  The patient was given information about Chronic Care Management services, agreed to services, and gave verbal consent prior to initiation of services.  Please see initial visit note for detailed documentation.   Patient Care Team: Einar Pheasant, MD as PCP - General (Internal Medicine) De Hollingshead, RPH-CPP (Pharmacist)   Objective:  Lab Results  Component Value Date   CREATININE 0.85 07/06/2021   CREATININE 0.69 06/20/2021   CREATININE 1.09 (H) 06/19/2021    Lab Results  Component Value Date   HGBA1C 6.4 07/06/2021   Last diabetic Eye exam:  Lab Results  Component Value Date/Time   HMDIABEYEEXA No Retinopathy 11/06/2020 12:00 AM    Last diabetic Foot exam:  Lab Results  Component Value Date/Time   HMDIABFOOTEX by Dr Cleda Mccreedy - podiatry 12/10/2018 12:00 AM        Component Value Date/Time   CHOL 102 07/06/2021 0955   TRIG 89.0 07/06/2021 0955   HDL 42.50 07/06/2021 0955   CHOLHDL 2 07/06/2021 0955   VLDL 17.8 07/06/2021 0955   LDLCALC 41 07/06/2021 0955   LDLDIRECT 103.0 03/30/2021 0909    Hepatic Function Latest Ref Rng & Units 07/06/2021 06/06/2021 05/23/2021  Total Protein 6.0 - 8.3 g/dL 5.9(L) 6.2(L) 6.2  Albumin 3.5 - 5.2 g/dL 3.5 3.4(L) 3.9  AST 0 - 37 U/L _0 ALT 0 - 35 U/L _1 Alk Phosphatase 39 - 117 U/L 89 54 69  Total Bilirubin 0.2 - 1.2 mg/dL 0.5 0.7 0.6  Bilirubin, Direct 0.0 - 0.3 mg/dL 0.1 - 0.2    Lab Results  Component  Value Date/Time   TSH 0.87 07/06/2021 09:55 AM   TSH 1.56 03/17/2020 09:01 AM    CBC Latest Ref Rng & Units 07/06/2021 06/20/2021 06/19/2021  WBC 4.0 - 10.5 K/uL 9.5 7.3 13.3(H)  Hemoglobin 12.0 - 15.0 g/dL 12.8 11.8(L) 11.6(L)  Hematocrit 36.0 - 46.0 % 38.6 36.4 34.2(L)  Platelets 150.0 - 400.0 K/uL 236.0 179 193     Social History   Tobacco Use  Smoking Status Every Day   Packs/day: 1.50   Years: 45.00   Pack years: 67.50   Types: Cigarettes   Passive exposure: Current  Smokeless Tobacco Never   BP Readings from Last 3 Encounters:  09/03/21 (!) 159/89  07/23/21 137/75  07/10/21 139/77   Pulse Readings from Last 3 Encounters:  09/03/21 94  07/23/21 90  07/10/21 85   Wt Readings from Last 3 Encounters:  09/03/21 218 lb (98.9 kg)  07/23/21 218 lb (98.9 kg)  06/20/21 218 lb 14.7 oz (99.3 kg)    Assessment: Review of patient past medical history, allergies, medications, health status, including review of consultants reports, laboratory and other test data, was performed as part of comprehensive evaluation and provision of chronic care management services.   SDOH:  (Social Determinants of Health) assessments and interventions performed:  SDOH Interventions    Flowsheet Row Most Recent Value  SDOH Interventions   Financial Strain Interventions  Other (Comment)  [manufacturer assistance]       CCM Care Plan  Allergies  Allergen Reactions   Varenicline Other (See Comments)    "I got really depressed" "I got really depressed" CHANTEX   Jardiance [Empagliflozin] Other (See Comments)    Yeast infection   Methylprednisolone Nausea Only and Nausea And Vomiting    Medications Reviewed Today     Reviewed by Ursula Alert, MD (Physician) on 09/05/21 at [place] List Status: <None>   Medication Order Taking? Sig Documenting Provider Last Dose Status Informant  acetaminophen (TYLENOL) 325 MG tablet [id number] No Take 650 mg by mouth every 6 (six) hours as needed.  [provider] Taking Active Self  albuterol (VENTOLIN HFA) 108 (90 Base) MCG/ACT inhaler [id number] No INHALE 2 PUFFS FOUR TIMES A Lemont Fillers, MD Taking Active            Med Note Mayo Ao Aug 06, 2021 11:24 AM)    amLODipine (NORVASC) 5 MG tablet [id number] No Take 1 tablet (5 mg total) by mouth daily. Einar Pheasant, MD Taking Active Self  apixaban (ELIQUIS) 5 MG TABS tablet [id number] No Take 1 tablet (5 mg total) by mouth 2 (two) times daily. Einar Pheasant, MD Taking Active   Blood Glucose Monitoring Suppl (TRUE METRIX METER) w/Device Drucie Opitz [id number] No  [provider] Taking Active Self  Budeson-Glycopyrrol-Formoterol (BREZTRI AEROSPHERE) 160-9-4.8 MCG/ACT AERO [id number] No Inhale 2 puffs into the lungs in the morning and at bedtime. Einar Pheasant, MD Taking Active Self  CALCIUM PO [id number] No Take 600 mg by mouth daily.  [provider] Taking Active Self  clopidogrel (PLAVIX) 75 MG tablet [id number] No Take 75 mg by mouth daily. [provider] Taking Active   diclofenac (VOLTAREN) 75 MG EC tablet [id number] No Take 75 mg by mouth 2 (two) times daily. [provider] Taking Active   dicyclomine (BENTYL) 20 MG tablet [id number] No Take 20 mg by mouth every 6 (six) hours as needed. [provider] Taking Active            Med Note Darnelle Maffucci, Waynette Buttery Aug 06, 2021 11:29 AM)    doxepin (SINEQUAN) 10 MG capsule [id number] No Take 1-2 capsules (10-20 mg total) by mouth at bedtime as needed. For sleep Ursula Alert, MD Taking Active   DULoxetine (CYMBALTA) 60 MG capsule [id number] No Take 1 capsule (60 mg total) by mouth daily. Einar Pheasant, MD Taking Active Self  famotidine (PEPCID) 40 MG tablet [id number] No TAKE 1 TABLET (40 MG TOTAL) BY MOUTH DAILY. Einar Pheasant, MD Taking Active   gabapentin (NEURONTIN) 300 MG capsule [id number]  Take 1 capsule (300 mg total) by mouth at bedtime. Ursula Alert, MD   Active   isosorbide mononitrate (IMDUR) 30 MG 24 hr tablet [id number] No TAKE 1 TABLET EVERY DAY Einar Pheasant, MD Taking Active   L-FORMULA LYSINE HCL PO [id number] No Take 1,000 mg by mouth daily. [provider] Taking Active Self  lamoTRIgine (LAMICTAL) 25 MG tablet [id number] No Take 1 tablet (25 mg total) by mouth daily. TAKE 1 TABLET EVERY DAY FOR MOOD Eappen, Saramma, MD Taking Active   metFORMIN (GLUCOPHAGE-XR) 500 MG 24 hr tablet [id number] No Take 2 tablets (1,000 mg total) by mouth in the morning and at bedtime. Einar Pheasant, MD Taking Active   Multiple Vitamin (MULTIVITAMIN PO) [id number] No Take 1 tablet by mouth daily. [provider] Taking Active Self  nystatin (MYCOSTATIN/NYSTOP)  powder [id number] No Apply topically 2 (two) times daily. to affected area(s) Einar Pheasant, MD Taking Active   oxyCODONE (OXY IR/ROXICODONE) 5 MG immediate release tablet [id number] No Take 1-2 tablets (5-10 mg total) by mouth every 4 (four) hours as needed for moderate pain (pain score 4-6). Sidney Ace, MD Taking Active            Med Note Mayo Ao Aug 06, 2021 11:28 AM) 1-2 daily  pantoprazole (PROTONIX) 40 MG tablet [id number] No TAKE 1 TABLET TWICE DAILY BEFORE MEALS Einar Pheasant, MD Taking Active   Potassium 99 MG TABS [id number] No Take 99 mg by mouth daily. [provider] Taking Active Self  propranolol (INDERAL) 40 MG tablet [id number] No 1 tab daily PO Einar Pheasant, MD Taking Active   rosuvastatin (CRESTOR) 20 MG tablet [id number] No TAKE 1 TABLET EVERY DAY Einar Pheasant, MD Taking Active   telmisartan (MICARDIS) 80 MG tablet [id number] No Take 1 tablet (80 mg total) by mouth daily. Einar Pheasant, MD Taking Active Self  trimethoprim (TRIMPEX) 100 MG tablet [id number] No Take 1 tablet (100 mg total) by mouth daily. Bjorn Loser, MD Taking Active   TRUE METRIX BLOOD GLUCOSE TEST test strip [id number] No USE AS INSTRUCTED TO CHECK BLOOD  SUGARS TWICE DAILY. Einar Pheasant, MD Taking Active   Vibegron [facility]) 75 MG TABS [id number]  Take 75 mg by mouth daily. Bjorn Loser, MD  Active             Patient Active Problem List   Diagnosis Date Noted   Lumbar spondylosis 07/27/2021   Close exposure to COVID-19 virus 07/15/2021   Electrolyte disorder 07/01/2021   Closed nondisplaced fracture of greater trochanter of left femur ([place]) 06/22/2021   Hip fx ([place]) 06/19/2021   Status post total hip replacement, left 06/12/2021   Paroxysmal A-fib ([place]) 06/07/2021   Primary osteoarthritis of left hip 05/18/2021   Moderate episode of recurrent major depressive disorder ([place]) 04/05/2021   Aortic atherosclerosis ([place]) 04/03/2021   SOBOE (shortness of breath on exertion) 01/16/2021   Thrush 12/27/2020   Suspected COVID-19 virus infection 12/20/2020   MDD (major depressive disorder), recurrent episode, mild ([place]) 11/16/2020   Abdominal wall abscess 07/30/2020   Skin lesion 07/23/2020   Boil 07/20/2020   MDD (major depressive disorder), recurrent, in full remission ([place]) 06/26/2020   Insomnia due to medical condition 06/26/2020   Anxiety disorder 06/26/2020   Cough 04/03/2020   Urge incontinence 03/02/2020   Urinary frequency 02/06/2020   Bilateral carotid artery stenosis 10/15/2019   PSVT (paroxysmal supraventricular tachycardia) ([place]) 08/09/2019   History of migraine headaches 05/10/2019   Lower abdominal pain 03/21/2019   Dysphagia 09/15/2018   ILD (interstitial lung disease) ([place]) 08/17/2017   Tobacco use disorder 08/11/2016   Venous insufficiency of both lower extremities 07/10/2016   Healthcare maintenance 06/18/2016   Lump in the abdomen 08/06/2015   Dizziness 08/06/2015   Fatty infiltration of liver 08/11/2014   Blood in the stool 08/11/2014   Acute diarrhea 08/11/2014   Hemorrhage of gastrointestinal tract 08/07/2014   GERD (gastroesophageal reflux disease) 10/20/2013   Iron deficiency anemia 10/20/2013    Nausea with vomiting 10/19/2013   LUQ pain 10/19/2013   Diarrhea 09/26/2013   Anemia 04/12/2013   CAD (coronary artery disease) 02/18/2013   COPD (chronic obstructive pulmonary disease) ([place]) 02/18/2013   Obstructive sleep apnea 02/18/2013   Mild depression 02/18/2013   Diverticulosis 02/18/2013   Diabetes ([place]) 02/17/2013  Essential hypertension, benign 02/17/2013   Hypercholesterolemia 02/17/2013    Immunization History  Administered Date(s) Administered   Influenza,inj,Quad PF,6+ Mos 09/23/2013, 09/09/2014, 08/01/2015, 08/06/2016, 08/14/2017, 08/14/2018, 09/16/2019, 07/13/2020   Influenza-Unspecified 10/24/2011   PFIZER Comirnaty(Gray Top)Covid-19 Tri-Sucrose Vaccine 03/15/2020   PFIZER(Purple Top)SARS-COV-2 Vaccination 02/23/2020, 02/09/2021   Pneumococcal Polysaccharide-23 09/23/2013   Td 04/21/2018    Conditions to be addressed/monitored: CAD, HTN, HLD, and COPD  Care Plan : General Pharmacy (Adult)  Updates made by De Hollingshead, RPH-CPP since 09/11/2021 12:00 AM     Problem: COPD, CAD, HLD, HTN      Long-Range Goal: Disease Progression Prevention   Recent Progress: On track  Priority: High  Note:   Current Barriers:  Unable to independently afford treatment regimen Complex patient with multiple comorbidities including diabetes, coronary artery disease, COPD, depression/anxiety  Pharmacist Clinical Goal(s):  Over the next 90 days, patient will verbalize ability to afford treatment regimen. Over the next 90 days, patient will adhere to prescribed medication regimen  Interventions: 1:1 collaboration with Einar Pheasant, MD regarding development and update of comprehensive plan of care as evidenced by provider attestation and co-signature Inter-disciplinary care team collaboration (see longitudinal plan of care) Comprehensive medication review performed; medication list updated in electronic medical record  Health Maintenance Yearly diabetic eye exam:  up to date Yearly diabetic foot exam: up to date Urine microalbumin: up to date Yearly influenza vaccination: due - recommend yearly influenza vaccine Td/Tdap vaccination: up to date Pneumonia vaccination: due - will discuss moving forward COVID vaccinations: due for bivalent booster Shingrix vaccinations: due- will discuss moving forward Colonoscopy: up to date Bone density scan: due - scheduled  Diabetes: Controlled; current treatment: metformin XR 1000 mg BID Previously recommended to continue current regimen at this time.   Hypertension, Coronary Artery Disease, new diagnosis of AFib Appropriately managed, follows w/ Dr. Nehemiah Massed, but plans to transition to Dr. Rockey Situ; current treatment:  Rate control: propranolol 40 mg daily  Anticoagulation: Eliquis 5 mg BID Additional antihypertensives/antianginals: amlodipine 5 mg daily, isosorbide 30 mg daily, telmisartan 80 mg daily Approved for Eliquis patient assistance through 11/24/21 Previously recommended to continue current regimen at this time  Hyperlipidemia, ASCVD Risk Reduction Controlled per last lipid panel; current treatment: atorvastatin 20 mg daily Antiplatelet therapy: none given concurrent anticoagulation Previously recommended to continue current regimen at this time  Chronic Obstructive Pulmonary Disease: Controlled; current treatment: Breztri 160/9/4.8 mcg 2 puffs BID; albuterol HFA PRN - 1-2 times daily due to wheezing and SOB; follows w/ Dr. Raul Del Receiving Rossiter through [facility] patient assistance Previously recommended to continue current regimen at this time along with pulmonology collaboration  Depression/Anxiety/Insomnia/Pain: Exacerbated by pain; follows w/ Dr. Shea Evans. Current regimen: lamotrigine 25 mg daily; duloxetine 60 mg daily; gabapentin 300 mg QPM, eszopiclone 1 mg QPM Recommended to continue current regimen at this time  Pain, s/p recent hip replacement with subsequent fracture Uncontrolled; current  treatment: diclofenac 75 mg BID, cyclobenzaprine 5 mg up to TID for pain/muscle spasms - denies benefit, acetaminophen 500 mg BID PRN,  oxycodone 5 mg 1-2 tablets Q4H s/p hip replacement  Previously recommended to continue current regimen at this time  Diarrhea, chronic with GERD: Moderately well controlled per patient report; Current regimen: taking imodium daily from OTC insurance benefits; pantoprazole 40 mg BID with famotidine 40 mg daily, dicyclomine 10 mg PRN  Previously encouraged to continue to collaborate with GI to determine the best long term plan for management of chronic diarrhea.   Overactive Bladder: Managed per  patient report; current regimen: Gemtesa 75 mg daily just started. Patient calls today to request patient assistance.  Previously on tolterodine, beneficial at first but then waned over time Myrbetriq too expensive on her insurance Oxybutynin did not have lasting benefit Unfortunately, no patient assistance programs for Gemtesa at this time. Encouraged her to contact Dr. Dene Gentry office to discuss options.   Tobacco Abuse: 1-1.5 packs per day; 50 years of use Previous quit attempts: unsuccessful using varenicline (nightmares) Triggers to smoke: stress, familial stress lately as well as her own health concerns.  Previously encouraged to start nicotine patches + gum when ready to quit.   Patient Goals/Self-Care Activities Over the next 90 days, patient will:  - take medications as prescribed collaborate with provider on medication access solutions  Follow Up Plan: Telephone follow up appointment with care management team member scheduled for:~ 4 weeks as previously scheduled       Patient's preferred pharmacy is:  [facility] [address], [place] [address] Phone: [phone] Fax: [phone]  [id number] [address]: [phone]: [phone]  Follow Up:  Patient agrees to Care Plan and Follow-up.  Plan: Telephone follow up appointment with care management team member scheduled for:  6 weeks  Catie TDarnelle Maffucci PharmD, BFlower Hill CCokatoClinical Pharmacist LOccidental Petroleumat BJohnson & [phone]

## 2021-09-13 ENCOUNTER — Other Ambulatory Visit: Payer: Self-pay | Admitting: Psychiatry

## 2021-09-13 ENCOUNTER — Other Ambulatory Visit: Payer: Self-pay | Admitting: Family Medicine

## 2021-09-13 DIAGNOSIS — F330 Major depressive disorder, recurrent, mild: Secondary | ICD-10-CM

## 2021-09-13 MED ORDER — GEMTESA 75 MG PO TABS: 75.0000 mg | ORAL_TABLET | Freq: Every day | ORAL | 11 refills | Status: DC

## 2021-09-17 ENCOUNTER — Other Ambulatory Visit: Payer: Self-pay

## 2021-09-17 ENCOUNTER — Encounter: Payer: Self-pay | Admitting: Psychiatry

## 2021-09-17 ENCOUNTER — Other Ambulatory Visit: Payer: Self-pay | Admitting: Cardiovascular Disease

## 2021-09-17 ENCOUNTER — Encounter: Payer: Self-pay | Admitting: Cardiovascular Disease

## 2021-09-17 ENCOUNTER — Ambulatory Visit: Payer: Medicare HMO | Admitting: Cardiovascular Disease

## 2021-09-17 ENCOUNTER — Telehealth (INDEPENDENT_AMBULATORY_CARE_PROVIDER_SITE_OTHER): Payer: Medicare HMO | Admitting: Psychiatry

## 2021-09-17 VITALS — BP 130/68 | HR 67 | Ht 64.0 in | Wt 198.2 lb

## 2021-09-17 DIAGNOSIS — E7800 Pure hypercholesterolemia, unspecified: Secondary | ICD-10-CM

## 2021-09-17 DIAGNOSIS — F418 Other specified anxiety disorders: Secondary | ICD-10-CM

## 2021-09-17 DIAGNOSIS — F3342 Major depressive disorder, recurrent, in full remission: Secondary | ICD-10-CM

## 2021-09-17 DIAGNOSIS — R002 Palpitations: Secondary | ICD-10-CM | POA: Diagnosis not present

## 2021-09-17 DIAGNOSIS — I471 Supraventricular tachycardia: Secondary | ICD-10-CM | POA: Diagnosis not present

## 2021-09-17 DIAGNOSIS — I6523 Occlusion and stenosis of bilateral carotid arteries: Secondary | ICD-10-CM

## 2021-09-17 DIAGNOSIS — I700 Atherosclerosis of aorta: Secondary | ICD-10-CM

## 2021-09-17 DIAGNOSIS — F17200 Nicotine dependence, unspecified, uncomplicated: Secondary | ICD-10-CM

## 2021-09-17 DIAGNOSIS — G4701 Insomnia due to medical condition: Secondary | ICD-10-CM

## 2021-09-17 DIAGNOSIS — I480 Paroxysmal atrial fibrillation: Secondary | ICD-10-CM | POA: Diagnosis not present

## 2021-09-17 DIAGNOSIS — I10 Essential (primary) hypertension: Secondary | ICD-10-CM

## 2021-09-17 DIAGNOSIS — I25118 Atherosclerotic heart disease of native coronary artery with other forms of angina pectoris: Secondary | ICD-10-CM

## 2021-09-17 MED ORDER — PROPRANOLOL HCL 40 MG PO TABS: ORAL_TABLET | ORAL | 1 refills | Status: DC

## 2021-09-17 MED ORDER — GABAPENTIN 300 MG PO CAPS: 300.0000 mg | ORAL_CAPSULE | Freq: Every day | ORAL | 0 refills | Status: DC

## 2021-09-17 MED ORDER — ESZOPICLONE 1 MG PO TABS: 1.0000 mg | ORAL_TABLET | Freq: Every evening | ORAL | 0 refills | Status: DC | PRN

## 2021-09-17 MED ORDER — PROPRANOLOL HCL ER 60 MG PO CP24: 60.0000 mg | ORAL_CAPSULE | Freq: Every day | ORAL | 3 refills | Status: DC

## 2021-09-17 NOTE — Telephone Encounter (Signed)
[facility] too exp[facility], please send to [facility]* If patient is at the pharmacy, call can be transferred to refill team.   1. Which medications need to be refilled? (please list name of each medication and dose if known) Popranolol  2. Which pharmacy/location (including street and city if local pharmacy) is medication to be sent to? [facility]  3. Do they need a 30 day or 90 day supply? [place]

## 2021-09-17 NOTE — Patient Instructions (Addendum)
Medication Instructions:  Propranolol ER 60 mg once a day Take the propranolol 20 to 40 mg short acting as needed  for breakthrough palpitations  If you need a refill on your cardiac medications before your next appointment, please call your pharmacy.    Lab work: No new labs needed  Testing/Procedures: No new testing needed  Follow-Up: At [facility], you and your health needs are our priority.  As part of our continuing mission to provide you with exceptional heart care, we have created designated Provider Care Teams.  These Care Teams include your primary Cardiologist (physician) and Advanced Practice Providers (APPs -  Physician Assistants and Nurse Practitioners) who all work together to provide you with the care you need, when you need it.  You will need a follow up appointment in 6 months  Providers on your designated Care Team:   Murray Hodgkins, NP Christell Faith, PA-C Cadence Kathlen Mody, [place]  COVID-19 Vaccine Information can be found at: [email or web address] For questions related to vaccine distribution or appointments, please email [email or web address] or call [phone].
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
## 2021-09-17 NOTE — Progress Notes (Addendum)
Cardiology Office Note  Date:  09/17/2021   ID:  JAKYRAH HOLLADAY, DOB 15-Mar-1956, MRN [id number]  PCP:  Kathryn Pheasant, MD   Chief Complaint  Patient presents with   New Patient (Initial Visit)    Self referral to establish care for CAD; stent placements in 2007 at [facility]. Patient c/o palpations at times, LE edema at times and shortness of breath  reviewed by the patient verbally.     HPI:  [name] is a 65 year old woman with past medical history of Depression Coronary artery disease catheterization June 2019 moderate to severe proximal and mid RCA disease occluded distal RCA with collaterals left to right mild LAD and circumflex disease Stent x2 2007 Smoking history/COPD Hyperlipidemia Who presents for new patient evaluation of her coronary disease Note indicating history of DVT Unclear history paroxysmal atrial fibrillation, on Kathryn Friedman  Husband in hospice, stress, had TIA Doing the best she can do to help take care of him Lots of stress, continues to smoke  Reports having occasional palpitations No chest pain Chronic mild shortness of breath which she attributes to COPD  Underwent hip replacement Following surgery developed acute problem with a hip Seen in the ER after hearing a popping sound followed by right leg/hip pain.  Found to have avulsion fracture of greater trochanter left hip DVT prophylaxis following procedure Kathryn Friedman 2.5 twice daily Was started on Kathryn Friedman 2.5 twice daily by Kathryn Friedman at discharge June 13, 2021  Hospitalist June 20, 2021 and H&P note reported paroxysmal atrial fibrillation with no documentation of this, presumably because she was on anticoagulation  On July 03, 2021, Kathryn Friedman through Kathryn Friedman office change the dose from 2.5 up to 5 twice daily presuming she had atrial fibrillation  -Follow-up visit with Kathryn Friedman on July 04, 2021 did not detail any documentation of atrial fibrillation.  On that visit heart  detailed as being regular rhythm  Lab work reviewed A1C 6.4 Total chol 102, LDL 41  Carotid u/s  07/2020 1. Minimal atherosclerotic plaque bilaterally resulting in less than 50% stenosis of the bilateral ICAs. 2. Antegrade flow is noted within both vertebral arteries.  Echo NORMAL LEFT VENTRICULAR SYSTOLIC FUNCTION  NORMAL RIGHT VENTRICULAR SYSTOLIC FUNCTION  MILD VALVULAR REGURGITATION (See above)  NO VALVULAR STENOSIS  MILD MR, TR  EF 55%   EKG personally reviewed by myself on todays visit NSR rate 67 no ST or T wave changes    PMH:   has a past medical history of Anemia, Anginal pain ([place]), Anxiety, Arthritis, Asthma, Bilateral carotid artery stenosis, Blood in stool, Chronic diarrhea, COPD (chronic obstructive pulmonary disease) ([place]), Coronary artery disease, Current use of long term anticoagulation, Depression, Diverticulitis, Diverticulosis, Dizzinesses, Dysphagia, Dyspnea, Fatty infiltration of liver, GERD (gastroesophageal reflux disease), Headache, History of 2019 novel coronavirus disease (COVID-19) (12/09/2020), History of 2019 novel coronavirus disease (COVID-19) (12/20/2020), Hypertension, Hypertriglyceridemia, ILD (interstitial lung disease) ([place]), Lump in the abdomen, OSA on CPAP, Overactive bladder, PSVT (paroxysmal supraventricular tachycardia) (HCC), Spastic colon, T2DM (type 2 diabetes mellitus) ([place]) (05/2008), Tobacco abuse, and Venous insufficiency of both lower extremities.  PSH:    Past Surgical History:  Procedure Laterality Date   ABDOMINAL HYSTERECTOMY  with left ovary in place [place] Left 10/14/2017   calcs bx, fibrosis giant cell reaction and chronic inflammation, negative for malignancy.    CATARACT EXTRACTION, BILATERAL     CESAREAN SECTION  1984   CHOLECYSTECTOMY  1985   COLONOSCOPY  WITH PROPOFOL N/A 09/13/2016   Procedure: COLONOSCOPY WITH PROPOFOL;  Surgeon: Kathryn Silvas, MD;  Location: [facility] ENDOSCOPY;   Service: Endoscopy;  Laterality: N/A;   COLONOSCOPY WITH PROPOFOL N/A 11/09/2018   Procedure: COLONOSCOPY WITH PROPOFOL;  Surgeon: Kathryn Silvas, MD;  Location[facility]COPY;  Service: Endoscopy;  Laterality: N/A;   COLONOSCOPY WITH PROPOFOL N/A 03/29/2020   Procedure: COLONOSCOPY WITH PROPOFOL;  Surgeon: Kathryn Bellow, MD;  Location: [facility] ENDOSCOPY;  Service: Endoscopy;  Laterality: N/A;   CORONARY ANGIOPLASTY WITH STENT PLACEMENT N/A 07/24/2006   75% pRCA; 3.5 x 28 mm Cypher DES placed; Location: [place]; Surgeons: Kathryn Coho, MD   ESOPHAGOGASTRODUODENOSCOPY (EGD) WITH PROPOFOL N/A 02/02/2018   Procedure: ESOPHAGOGASTRODUODENOSCOPY (EGD) WITH PROPOFOL;  Surgeon: Kathryn Silvas, MD;  Location: [facility] ENDOSCOPY;  Service: Endoscopy;  Laterality: N/A;   ESOPHAGOGASTRODUODENOSCOPY (EGD) WITH PROPOFOL N/A 03/29/2020   Procedure: ESOPHAGOGASTRODUODENOSCOPY (EGD) WITH PROPOFOL;  Surgeon: Kathryn Bellow, MD;  Location: [facility] ENDOSCOPY;  Service: Endoscopy;  Laterality: N/A;   EYE SURGERY     JOINT REPLACEMENT     bilateral knee replacements   KNEE ARTHROSCOPY  Arthroscopic left knee surgery    KNEE SURGERY  status post knee surgey    LEFT HEART CATH AND CORONARY ANGIOGRAPHY Left 05/14/2018   Procedure: LEFT HEART CATH AND CORONARY ANGIOGRAPHY;  Surgeon: Kathryn Skains, MD;  Location: [place] [facility];  Service: Cardiovascular;  Laterality: Left;   REPLACEMENT TOTAL KNEE  ([facility])   SHOULDER SURGERY  shoulder operation secondary to a torn tendon   TOTAL HIP ARTHROPLASTY Left 06/12/2021   Procedure: TOTAL HIP ARTHROPLASTY;  Surgeon: Kathryn Mull, MD;  Location: [facility] ORS;  Service: Orthopedics;  Laterality: Left;    Current Outpatient Medications  Medication Sig Dispense Refill   acetaminophen (TYLENOL) 325 MG tablet Take 650 mg by mouth every 6 (six) hours as needed.     albuterol (VENTOLIN HFA) 108 (90 Base) MCG/ACT inhaler INHALE 2 PUFFS FOUR TIMES A DAY 8.5 g 11    amLODipine (NORVASC) 5 MG tablet Take 1 tablet (5 mg total) by mouth daily. 90 tablet 1   apixaban (Kathryn Friedman) 5 MG TABS tablet Take 1 tablet (5 mg total) by mouth 2 (two) times daily. 60 tablet 2   Blood Glucose Monitoring Suppl (TRUE METRIX METER) w/Device KIT      Budeson-Glycopyrrol-Formoterol (BREZTRI AEROSPHERE) 160-9-4.8 MCG/ACT AERO Inhale 2 puffs into the lungs in the morning and at bedtime. 32.1 g 3   CALCIUM PO Take 600 mg by mouth daily.      diclofenac (VOLTAREN) 75 MG EC tablet Take 75 mg by mouth 2 (two) times daily.     dicyclomine (BENTYL) 20 MG tablet Take 20 mg by mouth every 6 (six) hours as needed.     DULoxetine (CYMBALTA) 60 MG capsule Take 1 capsule (60 mg total) by mouth daily. 90 capsule 1   eszopiclone (LUNESTA) 1 MG TABS tablet Take 1 tablet (1 mg total) by mouth at bedtime as needed for sleep. Take immediately before bedtime 15 tablet 0   famotidine (PEPCID) 40 MG tablet TAKE 1 TABLET (40 MG TOTAL) BY MOUTH DAILY. 90 tablet 1   gabapentin (NEURONTIN) 300 MG capsule Take 1 capsule (300 mg total) by mouth at bedtime. 30 capsule 1   isosorbide mononitrate (IMDUR) 30 MG 24 hr tablet TAKE 1 TABLET EVERY DAY 90 tablet 1   L-FORMULA LYSINE HCL PO Take 1,000 mg by mouth daily.     lamoTRIgine (  LAMICTAL) 25 MG tablet TAKE 1 TABLET EVERY DAY FOR MOOD 90 tablet 0   metFORMIN (GLUCOPHAGE-XR) 500 MG 24 hr tablet Take 2 tablets (1,000 mg total) by mouth in the morning and at bedtime. 360 tablet 1   Multiple Vitamin (MULTIVITAMIN PO) Take 1 tablet by mouth daily.     nystatin (MYCOSTATIN/NYSTOP) powder Apply topically 2 (two) times daily. to affected area(s) 30 g 0   oxyCODONE (OXY IR/ROXICODONE) 5 MG immediate release tablet Take 1-2 tablets (5-10 mg total) by mouth every 4 (four) hours as needed for moderate pain (pain score 4-6). 20 tablet 0   pantoprazole (PROTONIX) 40 MG tablet TAKE 1 TABLET TWICE DAILY BEFORE MEALS 180 tablet 1   Potassium 99 MG TABS Take 99 mg by mouth daily.      rosuvastatin (CRESTOR) 20 MG tablet TAKE 1 TABLET EVERY DAY 90 tablet 1   telmisartan (MICARDIS) 80 MG tablet Take 1 tablet (80 mg total) by mouth daily. 90 tablet 1   trimethoprim (TRIMPEX) 100 MG tablet Take 1 tablet (100 mg total) by mouth daily. 30 tablet 11   TRUE METRIX BLOOD GLUCOSE TEST test strip USE AS INSTRUCTED TO CHECK BLOOD SUGARS TWICE DAILY. 200 strip 1   Vibegron (GEMTESA) 75 MG TABS Take 75 mg by mouth daily. 30 tablet 11   propranolol (INDERAL) 40 MG tablet Take 20-40 mg daily as needed for breakthrough palpitations 180 tablet 1   propranolol ER (INDERAL LA) 60 MG 24 hr capsule Take 1 capsule (60 mg total) by mouth daily. 90 capsule 3   No current facility-administered medications for this visit.     Allergies:   Varenicline, Jardiance [empagliflozin], and Methylprednisolone   Social History:  The patient  reports that she has been smoking cigarettes. She has a 67.50 pack-year smoking history. She has been exposed to tobacco smoke. She has never used smokeless tobacco. She reports that she does not drink alcohol and does not use drugs.   Family History:   family history includes Bipolar disorder in her sister; Cirrhosis in her brother; Colon cancer in her paternal aunt; Depression in her mother; Heart disease in her father; Hepatitis C in her brother; Lung cancer in her mother; Mitral valve prolapse in her mother and sister; Other in her mother.    Review of Systems: Review of Systems  Constitutional: Negative.   HENT: Negative.    Respiratory:  Positive for shortness of breath.   Cardiovascular: Negative.   Gastrointestinal: Negative.   Musculoskeletal: Negative.   Neurological: Negative.   Psychiatric/Behavioral: Negative.    All other systems reviewed and are negative.   PHYSICAL EXAM: VS:  BP 130/68 (BP Location: Right Arm, Patient Position: Sitting, Cuff Size: Normal)   Pulse 67   Ht '5\' 4"'  (1.626 m)   Wt 198 lb 4 oz (89.9 kg)   SpO2 96%   BMI 34.03  kg/m  , BMI Body mass index is 34.03 kg/m. GEN: Well nourished, well developed, in no acute distress HEENT: normal Neck: no JVD, carotid bruits, or masses Cardiac: RRR; no murmurs, rubs, or gallops,no edema  Respiratory:  clear to auscultation bilaterally, normal work of breathing GI: soft, nontender, nondistended, + BS MS: no deformity or atrophy Skin: warm and dry, no rash Neuro:  Strength and sensation are intact Psych: euthymic mood, full affect   Recent Labs: 07/06/2021: ALT 11; BUN 9; Creatinine, Ser 0.85; Hemoglobin 12.8; Platelets 236.0; Potassium 4.4; Sodium 139; TSH 0.87    Lipid Panel Lab Results  Component Value Date   CHOL 102 07/06/2021   HDL 42.50 07/06/2021   LDLCALC 41 07/06/2021   TRIG 89.0 07/06/2021      Wt Readings from Last 3 Encounters:  09/17/21 198 lb 4 oz (89.9 kg)  09/03/21 218 lb (98.9 kg)  07/23/21 218 lb (98.9 kg)       ASSESSMENT AND PLAN:  Problem List Items Addressed This Visit       Cardiology Problems   Essential hypertension, benign   Relevant Medications   propranolol (INDERAL) 40 MG tablet   Hypercholesterolemia   Relevant Medications   propranolol (INDERAL) 40 MG tablet   CAD (coronary artery disease) - Primary   Relevant Medications   propranolol (INDERAL) 40 MG tablet   Aortic atherosclerosis (HCC)   Relevant Medications   propranolol (INDERAL) 40 MG tablet   Paroxysmal A-fib (HCC)   Relevant Medications   propranolol (INDERAL) 40 MG tablet   PSVT (paroxysmal supraventricular tachycardia) (HCC)   Relevant Medications   propranolol (INDERAL) 40 MG tablet   Bilateral carotid artery stenosis   Relevant Medications   propranolol (INDERAL) 40 MG tablet   Other Visit Diagnoses     Smoker       Palpitations       Relevant Orders   EKG 12-Lead      Arrhythmia No clear documentation of atrial fibrillation on chart review Possibly made by mistake, hospitalist presuming she had atrial fibrillation on admission  note June 13, 2021 was on Kathryn Friedman 2.5 twice daily at that time for DVT prophylaxis Discharge note indicating atrial fibrillation but no documentation was restarted on Kathryn Friedman 2.5 Seen by [facility]ology at [facility], changed to 5 twice daily with no documentation of atrial fibrillation -We will recommend we stop Kathryn Friedman, changed back to aspirin 81 mg daily until further documentation of atrial fibrillation identified -We will discuss with Dr. Nicki Reaper  for other arrhythmia/notes indicating SVT though no date available, we will change the short acting propranolol 40 to propranolol extended release 60 mg daily.  She can take the short acting propranolol 20-40 as needed for breakthrough tachypalpitations  ADDENDUM:09/29/21 Holter monitor reviewed from [facility] showing paroxysmal H fibrillation, burden 12% Would recommend we continue Kathryn Friedman 5 twice daily  Smoker We have encouraged her to continue to work on weaning her cigarettes and smoking cessation. She will continue to work on this and does not want any assistance with chantix.    Coronary disease with stable angina Strongly recommended smoking cessation Total cholesterol at goal 102    Total encounter time more than 60 minutes  Greater than 50% was spent in counseling and coordination of care with the patient    Signed, Esmond Plants, M.D., Ph.D. [place], [place]

## 2021-09-18 NOTE — Progress Notes (Signed)
Per review of chart, the first that I see documentation of afib is holter monitor 01/2021.  Per Dr Alveria Apley note - holter interpretation: Baseline normal sinus rhythm with maximum heart rate 168 bpm minimum of 68 bpm average of 88 bpm. There is a rare preventricular contraction there were frequent premature atrial contractions at 2.5% of the total. Heart rate was variable depending on activity levels. There was periods of supraventricular tachycardia most consistent with atrial fibrillation occurring for hours at a time. Majority of the heart rate was above 100 bpm during these episodes of atrial fibrillation. Some more more rapid into the 140 260 bpm range.   Thank you for seeing her. Let me know if I need to do anything more.

## 2021-09-20 ENCOUNTER — Telehealth: Payer: Self-pay

## 2021-09-20 ENCOUNTER — Telehealth: Payer: Self-pay | Admitting: *Deleted

## 2021-09-20 NOTE — Telephone Encounter (Signed)
Patient called in today and states the gemtesa is over 200.00 . She would like to try something cheaper.

## 2021-09-20 NOTE — Telephone Encounter (Signed)
pt left message that the medication you sent to [facility] was going to cost her $200 and she can not afford that she still not sleeping.

## 2021-09-20 NOTE — Telephone Encounter (Signed)
Contacted patient to discuss her concern about sleep medication-Lunesta.  Patient reports she got confused and that her sleep medication is not expensive and that it was another medication that she was on for her overactive bladder that was expensive.  She hence wants to stay on the Lunesta at this time.

## 2021-09-21 ENCOUNTER — Telehealth: Payer: Medicare HMO

## 2021-09-24 DIAGNOSIS — M1612 Unilateral primary osteoarthritis, left hip: Secondary | ICD-10-CM | POA: Diagnosis not present

## 2021-09-24 DIAGNOSIS — F32A Depression, unspecified: Secondary | ICD-10-CM

## 2021-09-24 DIAGNOSIS — F3342 Major depressive disorder, recurrent, in full remission: Secondary | ICD-10-CM | POA: Diagnosis not present

## 2021-09-24 DIAGNOSIS — E1159 Type 2 diabetes mellitus with other circulatory complications: Secondary | ICD-10-CM

## 2021-09-24 DIAGNOSIS — I2510 Atherosclerotic heart disease of native coronary artery without angina pectoris: Secondary | ICD-10-CM | POA: Diagnosis not present

## 2021-09-24 DIAGNOSIS — E7800 Pure hypercholesterolemia, unspecified: Secondary | ICD-10-CM | POA: Diagnosis not present

## 2021-09-24 DIAGNOSIS — I10 Essential (primary) hypertension: Secondary | ICD-10-CM | POA: Diagnosis not present

## 2021-09-24 DIAGNOSIS — F330 Major depressive disorder, recurrent, mild: Secondary | ICD-10-CM | POA: Diagnosis not present

## 2021-09-24 NOTE — Telephone Encounter (Signed)
Pt states gemtesa is working great. I advised pt to call her pharmacy to see if we can get a tier exception pt verbalized understanding and will call insurance company. In the mean time I will leave samples in [place] office for pt

## 2021-09-25 ENCOUNTER — Telehealth: Payer: Self-pay | Admitting: *Deleted

## 2021-09-25 NOTE — Telephone Encounter (Signed)
Patient called in today and states she call her insurance and started a tier exception for gemtesa

## 2021-10-01 DIAGNOSIS — Z96642 Presence of left artificial hip joint: Secondary | ICD-10-CM | POA: Diagnosis not present

## 2021-10-01 DIAGNOSIS — G8929 Other chronic pain: Secondary | ICD-10-CM | POA: Diagnosis not present

## 2021-10-01 DIAGNOSIS — M47816 Spondylosis without myelopathy or radiculopathy, lumbar region: Secondary | ICD-10-CM | POA: Diagnosis not present

## 2021-10-01 DIAGNOSIS — M545 Low back pain, unspecified: Secondary | ICD-10-CM | POA: Diagnosis not present

## 2021-10-01 DIAGNOSIS — S72115D Nondisplaced fracture of greater trochanter of left femur, subsequent encounter for closed fracture with routine healing: Secondary | ICD-10-CM | POA: Diagnosis not present

## 2021-10-01 DIAGNOSIS — M1612 Unilateral primary osteoarthritis, left hip: Secondary | ICD-10-CM | POA: Diagnosis not present

## 2021-10-03 ENCOUNTER — Telehealth: Payer: Self-pay

## 2021-10-03 NOTE — Telephone Encounter (Signed)
Patient called the office again today about her prescription for Gemtesa.  She states that [facility] has denied the tier exception because they want her to try Lisbeth Ply first.  Patient states that Lisbeth Ply is too expensive also.  She is requesting that someone call her to get information from her about filing another appeal for the [facility].    Patient can be reached at [phone].

## 2021-10-03 NOTE — Telephone Encounter (Signed)
Was able to reach out to Kathryn Friedman as requested by Dr. Rockey Situ to f/u on last OV discussion of her previous Holter monitor and A-fib, advised pt of Dr. Donivan Scull comment  Holter reviewed, does appear to be paroxysmal atrial fibrillation  Given the confirmation, would certainly stay on the Eliquis  Atrial fibrillation burden on that monitor around 12%, so not insignificant  Thx  TG   OV notes from 10/24 "New Patient" visit Arrhythmia No clear documentation of atrial fibrillation on chart review Possibly made by mistake, hospitalist presuming she had atrial fibrillation on admission note June 13, 2021 was on Eliquis 2.5 twice daily at that time for DVT prophylaxis Discharge note indicating atrial fibrillation but no documentation was restarted on Eliquis 2.5 Seen by cardiology at [facility], changed to 5 twice daily with no documentation of atrial fibrillation -We will recommend we stop Eliquis, changed back to aspirin 81 mg daily until further documentation of atrial fibrillation identified -We will discuss with Dr. Nicki Reaper  for other arrhythmia/notes indicating SVT though no date available, we will change the short acting propranolol 40 to propranolol extended release 60 mg daily.  She can take the short acting propranolol 20-40 as needed for breakthrough tachypalpitations   ADDENDUM:09/29/21 Holter monitor reviewed from [facility] showing paroxysmal H fibrillation, burden 12% Would recommend we continue Eliquis 5 twice daily  [name] is thankful for calling with the update, she will continue her Eliquis 5 mg BID.

## 2021-10-04 ENCOUNTER — Other Ambulatory Visit: Payer: Self-pay

## 2021-10-04 ENCOUNTER — Ambulatory Visit (INDEPENDENT_AMBULATORY_CARE_PROVIDER_SITE_OTHER): Payer: Medicare HMO | Admitting: Internal Medicine

## 2021-10-04 ENCOUNTER — Encounter: Payer: Self-pay | Admitting: Internal Medicine

## 2021-10-04 VITALS — BP 128/80 | HR 80 | Temp 97.8°F | Resp 16 | Ht 64.0 in | Wt 199.6 lb

## 2021-10-04 DIAGNOSIS — Z23 Encounter for immunization: Secondary | ICD-10-CM | POA: Diagnosis not present

## 2021-10-04 DIAGNOSIS — I10 Essential (primary) hypertension: Secondary | ICD-10-CM | POA: Diagnosis not present

## 2021-10-04 DIAGNOSIS — E7800 Pure hypercholesterolemia, unspecified: Secondary | ICD-10-CM | POA: Diagnosis not present

## 2021-10-04 DIAGNOSIS — I700 Atherosclerosis of aorta: Secondary | ICD-10-CM | POA: Diagnosis not present

## 2021-10-04 DIAGNOSIS — G4733 Obstructive sleep apnea (adult) (pediatric): Secondary | ICD-10-CM

## 2021-10-04 DIAGNOSIS — J849 Interstitial pulmonary disease, unspecified: Secondary | ICD-10-CM | POA: Diagnosis not present

## 2021-10-04 DIAGNOSIS — Z0000 Encounter for general adult medical examination without abnormal findings: Secondary | ICD-10-CM | POA: Diagnosis not present

## 2021-10-04 DIAGNOSIS — I480 Paroxysmal atrial fibrillation: Secondary | ICD-10-CM | POA: Diagnosis not present

## 2021-10-04 DIAGNOSIS — E1159 Type 2 diabetes mellitus with other circulatory complications: Secondary | ICD-10-CM | POA: Diagnosis not present

## 2021-10-04 DIAGNOSIS — I2510 Atherosclerotic heart disease of native coronary artery without angina pectoris: Secondary | ICD-10-CM

## 2021-10-04 DIAGNOSIS — K219 Gastro-esophageal reflux disease without esophagitis: Secondary | ICD-10-CM

## 2021-10-04 DIAGNOSIS — F17200 Nicotine dependence, unspecified, uncomplicated: Secondary | ICD-10-CM

## 2021-10-04 DIAGNOSIS — J449 Chronic obstructive pulmonary disease, unspecified: Secondary | ICD-10-CM

## 2021-10-04 DIAGNOSIS — F330 Major depressive disorder, recurrent, mild: Secondary | ICD-10-CM
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
## 2021-10-04 NOTE — Assessment & Plan Note (Signed)
Physical today 10/04/21.  Mammogram 04/05/21 - birads I.  Colonoscopy 03/30/20 - normal.

## 2021-10-05 NOTE — Telephone Encounter (Signed)
I spoke with patient regarding appeal for tier exception. Patient provided me with website to print forms. I printed forms, filled out, and faxed forms.

## 2021-10-09 ENCOUNTER — Other Ambulatory Visit: Payer: Medicare HMO

## 2021-10-09 NOTE — Telephone Encounter (Signed)
Left message to touch base with patient.

## 2021-10-10 NOTE — Telephone Encounter (Signed)
Pt aware. And will pick up samples. I advised her I will try and come up with a plan.

## 2021-10-10 NOTE — Telephone Encounter (Signed)
Tier exception was denied. Left message for pt. I have samples put aside for her.

## 2021-10-14 ENCOUNTER — Encounter: Payer: Self-pay | Admitting: Internal Medicine

## 2021-10-14 NOTE — Assessment & Plan Note (Signed)
Being followed by psychiatry.  Appears to be doing well on cymbalta and lamictal.  Follow.

## 2021-10-14 NOTE — Assessment & Plan Note (Signed)
Blood pressure as outlined.  Continue micardis 80mg  q day.  Follow pressures.  Follow metabolic panel.

## 2021-10-14 NOTE — Assessment & Plan Note (Signed)
Continue lipitor  ?

## 2021-10-14 NOTE — Assessment & Plan Note (Signed)
S/p stent placement.  Continue lipitor. Followed by cardiology.  On eliquis.    

## 2021-10-14 NOTE — Assessment & Plan Note (Signed)
CPAP.  

## 2021-10-14 NOTE — Assessment & Plan Note (Signed)
Found to have afib.  On eliquis.  No increased heart rate or palpitations.  Follow. Continue eliquis.

## 2021-10-14 NOTE — Assessment & Plan Note (Signed)
Low carb diet and exercise.  On metformin.  Follow met b and a1c. 

## 2021-10-14 NOTE — Assessment & Plan Note (Signed)
No upper symptoms reported.  On protonix.   

## 2021-10-14 NOTE — Assessment & Plan Note (Signed)
On crestor.  Low cholesterol diet and exercise.  Follow lipid panel and liver function tests.   

## 2021-10-14 NOTE — Assessment & Plan Note (Signed)
Continue symbicort.  Breathing stable.  

## 2021-10-14 NOTE — Assessment & Plan Note (Signed)
Continue symbicort.  Breathing stable.  No increased cough or congestion.

## 2021-10-14 NOTE — Assessment & Plan Note (Signed)
Discussed the need to quit smoking.  Declines to quit at this time.  Follow.

## 2021-10-15 ENCOUNTER — Ambulatory Visit (INDEPENDENT_AMBULATORY_CARE_PROVIDER_SITE_OTHER): Payer: Medicare HMO | Admitting: Urology

## 2021-10-15 ENCOUNTER — Other Ambulatory Visit: Payer: Self-pay

## 2021-10-15 VITALS — BP 161/96 | HR 71

## 2021-10-15 DIAGNOSIS — N3946 Mixed incontinence: Secondary | ICD-10-CM | POA: Diagnosis not present

## 2021-10-15 NOTE — Progress Notes (Signed)
10/15/2021 10:18 AM   Kathryn Friedman 1956-11-21 [id number]  Referring provider: Einar Pheasant, MD [address] [place],  [place] [address]  No chief complaint on file.   HPI: Dr Bernardo Heater: Overactive bladder partial responder to Detrol switched to [facility] April 2022.  The Myrbetriq was $125.  She was given oxybutynin   Currently patient has urgency incontinence.  She leaks with coughing sneezing bending lifting.  She uses a walker.  She can soak 2-4 pads a day.  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  She failed oxybutynin that she is currently taking.  Detrol helped minimally.  She cannot remember if Myrbetriq helped but was too expensive  She voids every 1-2 hours gets up 3-5 times a night.  She has ankle edema  She may have had an infection in the hospital.  She did have a positive culture     Patient has mixed incontinence.  Urine sent for culture.  She has failed a number of medications.  trimethoprim milligrams 30x11 and oxybutynin.  I will treat her cultures positive.  I may or may not keep her on prophylaxis long-term pending culture results. Myrbetriq may or may not have worked and samples could always be given.    I may bring up urodynamics at that stage but depend on the findings I may just offer her third line overactive bladder treatment.     Culture was positive and treated and patient went back on trimethoprim Still having urgency incontinence on trimethoprim On pelvic examination patient had grade 1 hypermobility the bladder neck and no stress incontinence or prolapse Cystoscopy: Normal  Patient has overactive bladder.  She is urgency incontinence and frequency.  She said the Myrbetriq was too expensive and it also failed.  3 neuromodulation treatments discussed.  The drive to [facility] for test stimulation will be difficult.  Handouts given.  Recognizing potential cost and sample patient  decided to try the new beta 3 agonist.    Today Frequency stable. Patient is 100% dry on Gemtesa.  She thinks the results are amazing and she is very happy.  Infection free on trimethoprim.  I highly recommended since she has failed Myrbetriq as well as oxybutynin.  She otherwise would have to have Botox and InterStim or percutaneous tibial nerve stimulation.  Nurses have been working on this.   PMH: Past Medical History:  Diagnosis Date   Anemia    Anginal pain (HCC)    Anxiety    Arthritis    back and knees   Asthma    Bilateral carotid artery stenosis    Blood in stool    Chronic diarrhea    COPD (chronic obstructive pulmonary disease) (HCC)    Coronary artery disease    a.) 75% pRCA; 3.5 x 28 mm Cypher DES placed on 07/24/2006   Current use of long term anticoagulation    Clopidogrel   Depression    secondary to the death of her husband (died 01-27-1995)   Diverticulitis    Diverticulosis    Dizzinesses    Dysphagia    Dyspnea    Fatty infiltration of liver    GERD (gastroesophageal reflux disease)    Headache    History of 01/27/2018 novel coronavirus disease (COVID-19) 12/09/2020   History of 2018/01/27 novel coronavirus disease (COVID-19) 12/20/2020   Hypertension    Hypertriglyceridemia    ILD (interstitial lung disease) ([place])    Lump in the abdomen  OSA on CPAP    Overactive bladder    PSVT (paroxysmal supraventricular tachycardia) (HCC)    Spastic colon    T2DM (type 2 diabetes mellitus) ([place]) 05/2008   Tobacco abuse    Venous insufficiency of both lower extremities     Surgical History: Past Surgical History:  Procedure Laterality Date   ABDOMINAL HYSTERECTOMY  with left ovary in place [place] Left 10/14/2017   calcs bx, fibrosis giant cell reaction and chronic inflammation, negative for malignancy.    CATARACT EXTRACTION, BILATERAL     CESAREAN SECTION  1984   CHOLECYSTECTOMY  1985   COLONOSCOPY WITH PROPOFOL N/A 09/13/2016    Procedure: COLONOSCOPY WITH PROPOFOL;  Surgeon: Manya Silvas, MD;  Location: [facility] ENDOSCOPY;  Service: Endoscopy;  Laterality: N/A;   COLONOSCOPY WITH PROPOFOL N/A 11/09/2018   Procedure: COLONOSCOPY WITH PROPOFOL;  Surgeon: Manya Silvas, MD;  Location: [facility] ENDOSCOPY;  Service: Endoscopy;  Laterality: N/A;   COLONOSCOPY WITH PROPOFOL N/A 03/29/2020   Procedure: COLONOSCOPY WITH PROPOFOL;  Surgeon: Robert Bellow, MD;  Location: [facility] ENDOSCOPY;  Service: Endoscopy;  Laterality: N/A;   CORONARY ANGIOPLASTY WITH STENT PLACEMENT N/A 07/24/2006   75% pRCA; 3.5 x 28 mm Cypher DES placed; Location: [place]; Surgeons: Katrine Coho, MD   ESOPHAGOGASTRODUODENOSCOPY (EGD) WITH PROPOFOL N/A 02/02/2018   Procedure: ESOPHAGOGASTRODUODENOSCOPY (EGD) WITH PROPOFOL;  Surgeon: Manya Silvas, MD;  Location: [facility] ENDOSCOPY;  Service: Endoscopy;  Laterality: N/A;   ESOPHAGOGASTRODUODENOSCOPY (EGD) WITH PROPOFOL N/A 03/29/2020   Procedure: ESOPHAGOGASTRODUODENOSCOPY (EGD) WITH PROPOFOL;  Surgeon: Robert Bellow, MD;  Location: [facility] ENDOSCOPY;  Service: Endoscopy;  Laterality: N/A;   EYE SURGERY     JOINT REPLACEMENT     bilateral knee replacements   KNEE ARTHROSCOPY  Arthroscopic left knee surgery    KNEE SURGERY  status post knee surgey    LEFT HEART CATH AND CORONARY ANGIOGRAPHY Left 05/14/2018   Procedure: LEFT HEART CATH AND CORONARY ANGIOGRAPHY;  Surgeon: Corey Skains, MD;  Location: [place] CV LAB;  Service: Cardiovascular;  Laterality: Left;   REPLACEMENT TOTAL KNEE  ([facility])   SHOULDER SURGERY  shoulder operation secondary to a torn tendon   TOTAL HIP ARTHROPLASTY Left 06/12/2021   Procedure: TOTAL HIP ARTHROPLASTY;  Surgeon: Corky Mull, MD;  Location: [facility] ORS;  Service: Orthopedics;  Laterality: Left;    Home Medications:  Allergies as of 10/15/2021       Reactions   Varenicline Other (See Comments)   "I got really depressed" "I got really depressed" CHANTEX    Jardiance [empagliflozin] Other (See Comments)   Yeast infection   Methylprednisolone Nausea Only, Nausea And Vomiting        Medication List        Accurate as of October 15, 2021 10:18 AM. If you have any questions, ask your nurse or doctor.          acetaminophen 325 MG tablet Commonly known as: TYLENOL Take 650 mg by mouth every 6 (six) hours as needed.   albuterol 108 (90 Base) MCG/ACT inhaler Commonly known as: Ventolin HFA INHALE 2 PUFFS FOUR TIMES A DAY   amLODipine 5 MG tablet Commonly known as: NORVASC Take 1 tablet (5 mg total) by mouth daily.   apixaban 5 MG Tabs tablet Commonly known as: ELIQUIS Take 1 tablet (5 mg total) by mouth 2 (two) times daily.   Breztri Aerosphere 160-9-4.8 MCG/ACT Aero Generic drug: Budeson-Glycopyrrol-Formoterol Inhale 2  puffs into the lungs in the morning and at bedtime.   CALCIUM PO Take 600 mg by mouth daily.   diclofenac 75 MG EC tablet Commonly known as: VOLTAREN Take 75 mg by mouth 2 (two) times daily.   dicyclomine 20 MG tablet Commonly known as: BENTYL Take 20 mg by mouth every 6 (six) hours as needed.   DULoxetine 60 MG capsule Commonly known as: CYMBALTA Take 1 capsule (60 mg total) by mouth daily.   eszopiclone 1 MG Tabs tablet Commonly known as: LUNESTA Take 1 tablet (1 mg total) by mouth at bedtime as needed for sleep. Take immediately before bedtime   famotidine 40 MG tablet Commonly known as: PEPCID TAKE 1 TABLET (40 MG TOTAL) BY MOUTH DAILY.   gabapentin 300 MG capsule Commonly known as: Neurontin Take 1 capsule (300 mg total) by mouth at bedtime.   Gemtesa 75 MG Tabs Generic drug: Vibegron Take 75 mg by mouth daily.   isosorbide mononitrate 30 MG 24 hr tablet Commonly known as: IMDUR TAKE 1 TABLET EVERY DAY   lamoTRIgine 25 MG tablet Commonly known as: LAMICTAL TAKE 1 TABLET EVERY DAY FOR MOOD   metFORMIN 500 MG 24 hr tablet Commonly known as: GLUCOPHAGE-XR Take 2 tablets (1,000 mg  total) by mouth in the morning and at bedtime.   MULTIVITAMIN PO Take 1 tablet by mouth daily.   nystatin powder Commonly known as: MYCOSTATIN/NYSTOP Apply topically 2 (two) times daily. to affected area(s)   oxyCODONE 5 MG immediate release tablet Commonly known as: Oxy IR/ROXICODONE Take 1-2 tablets (5-10 mg total) by mouth every 4 (four) hours as needed for moderate pain (pain score 4-6).   pantoprazole 40 MG tablet Commonly known as: PROTONIX TAKE 1 TABLET TWICE DAILY BEFORE MEALS   Potassium 99 MG Tabs Take 99 mg by mouth daily.   propranolol ER 60 MG 24 hr capsule Commonly known as: INDERAL LA Take 1 capsule (60 mg total) by mouth daily.   rosuvastatin 20 MG tablet Commonly known as: CRESTOR TAKE 1 TABLET EVERY DAY   telmisartan 80 MG tablet Commonly known as: Micardis Take 1 tablet (80 mg total) by mouth daily.   trimethoprim 100 MG tablet Commonly known as: TRIMPEX Take 1 tablet (100 mg total) by mouth daily.   True Metrix Blood Glucose Test test strip Generic drug: glucose blood USE AS INSTRUCTED TO CHECK BLOOD SUGARS TWICE DAILY.   True Metrix Meter w/Device Kit        Allergies:  Allergies  Allergen Reactions   Varenicline Other (See Comments)    "I got really depressed" "I got really depressed" CHANTEX   Jardiance [Empagliflozin] Other (See Comments)    Yeast infection   Methylprednisolone Nausea Only and Nausea And Vomiting    Family History: Family History  Problem Relation Age of Onset   Other Mother        Hit by a fire truck and has had multiple operations on her back , and has history of MVP    Mitral valve prolapse Mother    Lung cancer Mother    Depression Mother    Heart disease Father        myocardial infarction and is status post bypass surgery   Mitral valve prolapse Sister    Bipolar disorder Sister    Hepatitis C Brother    Cirrhosis Brother    Colon cancer Paternal Aunt    Breast cancer Neg Hx    Prostate cancer Neg  Hx  Bladder Cancer Neg Hx    Kidney cancer Neg Hx     Social History:  reports that she has been smoking cigarettes. She has a 67.50 pack-year smoking history. She has been exposed to tobacco smoke. She has never used smokeless tobacco. She reports that she does not drink alcohol and does not use drugs.  ROS:                                        Physical Exam: There were no vitals taken for this visit.  Constitutional:  Alert and oriented, No acute distress. HEENT: [place] AT, moist mucus membranes.  Trachea midline, no masses.  Laboratory Data: Lab Results  Component Value Date   WBC 9.5 07/06/2021   HGB 12.8 07/06/2021   HCT 38.6 07/06/2021   MCV 95.0 07/06/2021   PLT 236.0 07/06/2021    Lab Results  Component Value Date   CREATININE 0.85 07/06/2021    No results found for: PSA  No results found for: TESTOSTERONE  Lab Results  Component Value Date   HGBA1C 6.4 07/06/2021    Urinalysis    Component Value Date/Time   COLORURINE YELLOW (A) 06/19/2021 1836   APPEARANCEUR Cloudy (A) 09/03/2021 0852   LABSPEC 1.004 (L) 06/19/2021 1836   PHURINE 6.0 06/19/2021 1836   GLUCOSEU Negative 09/03/2021 0852   HGBUR NEGATIVE 06/19/2021 1836   BILIRUBINUR Negative 09/03/2021 0852   KETONESUR NEGATIVE 06/19/2021 1836   PROTEINUR Negative 09/03/2021 0852   PROTEINUR NEGATIVE 06/19/2021 1836   UROBILINOGEN 0.2 03/18/2017 0851   NITRITE Negative 09/03/2021 0852   NITRITE POSITIVE (A) 06/19/2021 1836   LEUKOCYTESUR Negative 09/03/2021 0852   LEUKOCYTESUR NEGATIVE 06/19/2021 1836    Pertinent Imaging:   Assessment & Plan: Reassess 3 months with samples.  Try to take it every second day and last month.  Double check about trimethoprim next visit  There are no diagnoses linked to this encounter.  No follow-ups on file.  Reece Packer, MD  [facility] [address] [place],  [address] [phone]

## 2021-10-16 ENCOUNTER — Telehealth: Payer: Self-pay | Admitting: Internal Medicine

## 2021-10-16 NOTE — Telephone Encounter (Signed)
Pt called in regards to medication apixaban (ELIQUIS) 5 MG TABS tablet. Pt states she wanted to call with an update, that she finally received three bottles of this medication in the mail yesterday 11/21

## 2021-10-17 ENCOUNTER — Other Ambulatory Visit: Payer: Medicare HMO

## 2021-10-17 NOTE — Telephone Encounter (Signed)
Noted  

## 2021-10-22 ENCOUNTER — Telehealth: Payer: Self-pay | Admitting: Internal Medicine

## 2021-10-22 ENCOUNTER — Other Ambulatory Visit (INDEPENDENT_AMBULATORY_CARE_PROVIDER_SITE_OTHER): Payer: Medicare HMO

## 2021-10-22 ENCOUNTER — Other Ambulatory Visit: Payer: Self-pay

## 2021-10-22 DIAGNOSIS — E1159 Type 2 diabetes mellitus with other circulatory complications: Secondary | ICD-10-CM | POA: Diagnosis not present

## 2021-10-22 DIAGNOSIS — E7800 Pure hypercholesterolemia, unspecified: Secondary | ICD-10-CM | POA: Diagnosis not present

## 2021-10-22 DIAGNOSIS — I10 Essential (primary) hypertension: Secondary | ICD-10-CM | POA: Diagnosis not present

## 2021-10-22 LAB — LIPID PANEL
Cholesterol: 120 mg/dL (ref 0–200)
HDL: 43.4 mg/dL (ref >39.00)
LDL Cholesterol: 51 mg/dL (ref 0–99)
NonHDL: 76.88
Total CHOL/HDL Ratio: 3
Triglycerides: 128 mg/dL (ref 0.0–149.0)
VLDL: 25.6 mg/dL (ref 0.0–40.0)

## 2021-10-22 LAB — BASIC METABOLIC PANEL
BUN: 5 mg/dL — ABNORMAL LOW (ref 6–23)
CO2: 26 mEq/L (ref 19–32)
Calcium: 9.3 mg/dL (ref 8.4–10.5)
Chloride: 100 mEq/L (ref 96–112)
Creatinine, Ser: 0.63 mg/dL (ref 0.40–1.20)
GFR: 92.96 mL/min (ref >60.00)
Glucose, Bld: 177 mg/dL — ABNORMAL HIGH (ref 70–99)
Potassium: 3.8 mEq/L (ref 3.5–5.1)
Sodium: 137 mEq/L (ref 135–145)

## 2021-10-22 LAB — HEPATIC FUNCTION PANEL
ALT: 10 U/L (ref 0–35)
AST: 13 U/L (ref 0–37)
Albumin: 4 g/dL (ref 3.5–5.2)
Alkaline Phosphatase: 77 U/L (ref 39–117)
Bilirubin, Direct: 0.1 mg/dL (ref 0.0–0.3)
Total Bilirubin: 0.5 mg/dL (ref 0.2–1.2)
Total Protein: 6.4 g/dL (ref 6.0–8.3)

## 2021-10-22 LAB — HEMOGLOBIN A1C: Hgb A1c MFr Bld: 7.2 % — ABNORMAL HIGH (ref 4.6–6.5)

## 2021-10-22 NOTE — Telephone Encounter (Signed)
Pt called in requesting to speak with Catie regarding symbicort inhalers she has left over. Pt was wondering what to exactly do with them also pt will be in office today 11/28 at 11am for a lab appt

## 2021-10-22 NOTE — Telephone Encounter (Signed)
Spoke with patient. Discussed disposal.

## 2021-10-26 ENCOUNTER — Other Ambulatory Visit: Payer: Self-pay | Admitting: Internal Medicine

## 2021-10-31 ENCOUNTER — Ambulatory Visit: Payer: Medicare HMO | Admitting: Psychiatry

## 2021-11-03 ENCOUNTER — Encounter: Payer: Self-pay | Admitting: Internal Medicine

## 2021-11-05 ENCOUNTER — Ambulatory Visit
Admission: RE | Admit: 2021-11-05 | Discharge: 2021-11-05 | Disposition: A | Discharge status: Home / Self Care | Payer: Medicare HMO | Source: Ambulatory Visit | Attending: Internal Medicine | Admitting: Internal Medicine

## 2021-11-05 ENCOUNTER — Other Ambulatory Visit: Payer: Self-pay

## 2021-11-05 DIAGNOSIS — Z780 Asymptomatic menopausal state: Secondary | ICD-10-CM | POA: Diagnosis not present

## 2021-11-05 DIAGNOSIS — Z13820 Encounter for screening for osteoporosis: Secondary | ICD-10-CM | POA: Diagnosis not present

## 2021-11-06 NOTE — Telephone Encounter (Signed)
Please schedule her a visit to discuss.  If we take her off metformin, she will need another medication to help control her blood sugar.  Please schedule an appt.  Can see if she can come in 12:00 Thursday or Friday of this week.  Was not sure if someone already in Thursday 12:00 spot.

## 2021-11-06 NOTE — Telephone Encounter (Signed)
Pt returning call. Pt requesting callback.  °

## 2021-11-06 NOTE — Telephone Encounter (Signed)
LMTCB trying to schedule 11/08/21 at 4:00

## 2021-11-07 ENCOUNTER — Other Ambulatory Visit: Payer: Self-pay | Admitting: Internal Medicine

## 2021-11-07 NOTE — Telephone Encounter (Signed)
Pt is returning call. Pt requesting callback

## 2021-11-07 NOTE — Telephone Encounter (Signed)
Patient scheduled for Monday 12/19

## 2021-11-12 ENCOUNTER — Telehealth (INDEPENDENT_AMBULATORY_CARE_PROVIDER_SITE_OTHER): Payer: Medicare HMO | Admitting: Internal Medicine

## 2021-11-12 ENCOUNTER — Encounter: Payer: Self-pay | Admitting: Internal Medicine

## 2021-11-12 DIAGNOSIS — E1159 Type 2 diabetes mellitus with other circulatory complications: Secondary | ICD-10-CM

## 2021-11-12 DIAGNOSIS — R197 Diarrhea, unspecified: Secondary | ICD-10-CM | POA: Diagnosis not present

## 2021-11-12 NOTE — Progress Notes (Signed)
Patient ID: Kathryn Friedman, female   DOB: [birth year], 65 y.o.   MRN: [id number]   Virtual Visit via video Note  This visit type was conducted due to national recommendations for restrictions regarding the COVID-19 pandemic (e.g. social distancing).  This format is felt to be most appropriate for this patient at this time.  All issues noted in this document were discussed and addressed.  No physical exam was performed (except for noted visual exam findings with Video Visits).   I connected with [name] today by a video enabled telemedicine application and verified that I am speaking with the correct person using two identifiers. Location patient: home Location provider: work Persons participating in the virtual visit: patient, provider  The limitations, risks, security and privacy concerns of performing an evaluation and management service by video and the availability of in person appointments have been discussed.  It has also been discussed with the patient that there may be a patient responsible charge related to this service. The patient expressed understanding and agreed to proceed.   Reason for visit: work in appt  HPI: Work in to discuss metformin.  Has had persistent issues with diarrhea.  Feels is related to metformin. On the extended release form.  States this am - blood sugar 120s.  Diarrhea has been persistent for year.  (Intermittent flares - worse at times).  She has been on suppressive abx since 05/2021.  States stools are occurring 3-5x/day and has been like this for the last two months.  No fever.  Eating.  No nausea or vomiting.  No abdominal pain.     ROS: See pertinent positives and negatives per HPI.  Past Medical History:  Diagnosis Date   Anemia    Anginal pain (HCC)    Anxiety    Arthritis    back and knees   Asthma    Bilateral carotid artery stenosis    Blood in stool    Chronic diarrhea    COPD (chronic obstructive pulmonary disease)  (HCC)    Coronary artery disease    a.) 75% pRCA; 3.5 x 28 mm Cypher DES placed on 07/24/2006   Current use of long term anticoagulation    Clopidogrel   Depression    secondary to the death of her husband (died 70)   Diverticulitis    Diverticulosis    Dizzinesses    Dysphagia    Dyspnea    Fatty infiltration of liver    GERD (gastroesophageal reflux disease)    Headache    History of 2019 novel coronavirus disease (COVID-19) 12/09/2020   History of 2019 novel coronavirus disease (COVID-19) 12/20/2020   Hypertension    Hypertriglyceridemia    ILD (interstitial lung disease) ([place])    Lump in the abdomen    OSA on CPAP    Overactive bladder    PSVT (paroxysmal supraventricular tachycardia) (HCC)    Spastic colon    T2DM (type 2 diabetes mellitus) ([place]) 05/2008   Tobacco abuse    Venous insufficiency of both lower extremities     Past Surgical History:  Procedure Laterality Date   ABDOMINAL HYSTERECTOMY  with left ovary in place [place] Left 10/14/2017   calcs bx, fibrosis giant cell reaction and chronic inflammation, negative for malignancy.    CATARACT EXTRACTION, BILATERAL     CESAREAN SECTION  1984   CHOLECYSTECTOMY  1985   COLONOSCOPY WITH PROPOFOL N/A 09/13/2016   Procedure: COLONOSCOPY  WITH PROPOFOL;  Surgeon: Manya Silvas, MD;  Location: [facility] ENDOSCOPY;  Service: Endoscopy;  Laterality: N/A;   COLONOSCOPY WITH PROPOFOL N/A 11/09/2018   Procedure: COLONOSCOPY WITH PROPOFOL;  Surgeon: Manya Silvas, MD;  Location: [facility] ENDOSCOPY;  Service: Endoscopy;  Laterality: N/A;   COLONOSCOPY WITH PROPOFOL N/A 03/29/2020   Procedure: COLONOSCOPY WITH PROPOFOL;  Surgeon: Robert Bellow, MD;  Location: [facility] ENDOSCOPY;  Service: Endoscopy;  Laterality: N/A;   CORONARY ANGIOPLASTY WITH STENT PLACEMENT N/A 07/24/2006   75% pRCA; 3.5 x 28 mm Cypher DES placed; Location: [place]; Surgeons: Katrine Coho, MD   ESOPHAGOGASTRODUODENOSCOPY  (EGD) WITH PROPOFOL N/A 02/02/2018   Procedure: ESOPHAGOGASTRODUODENOSCOPY (EGD) WITH PROPOFOL;  Surgeon: Manya Silvas, MD;  Location: [facility] ENDOSCOPY;  Service: Endoscopy;  Laterality: N/A;   ESOPHAGOGASTRODUODENOSCOPY (EGD) WITH PROPOFOL N/A 03/29/2020   Procedure: ESOPHAGOGASTRODUODENOSCOPY (EGD) WITH PROPOFOL;  Surgeon: Robert Bellow, MD;  Location: [facility] ENDOSCOPY;  Service: Endoscopy;  Laterality: N/A;   EYE SURGERY     JOINT REPLACEMENT     bilateral knee replacements   KNEE ARTHROSCOPY  Arthroscopic left knee surgery    KNEE SURGERY  status post knee surgey    LEFT HEART CATH AND CORONARY ANGIOGRAPHY Left 05/14/2018   Procedure: LEFT HEART CATH AND CORONARY ANGIOGRAPHY;  Surgeon: Corey Skains, MD;  Location: [place] CV LAB;  Service: Cardiovascular;  Laterality: Left;   REPLACEMENT TOTAL KNEE  ([facility])   SHOULDER SURGERY  shoulder operation secondary to a torn tendon   TOTAL HIP ARTHROPLASTY Left 06/12/2021   Procedure: TOTAL HIP ARTHROPLASTY;  Surgeon: Corky Mull, MD;  Location: [facility] ORS;  Service: Orthopedics;  Laterality: Left;    Family History  Problem Relation Age of Onset   Other Mother        Hit by a fire truck and has had multiple operations on her back , and has history of MVP    Mitral valve prolapse Mother    Lung cancer Mother    Depression Mother    Heart disease Father        myocardial infarction and is status post bypass surgery   Mitral valve prolapse Sister    Bipolar disorder Sister    Hepatitis C Brother    Cirrhosis Brother    Colon cancer Paternal Aunt    Breast cancer Neg Hx    Prostate cancer Neg Hx    Bladder Cancer Neg Hx    Kidney cancer Neg Hx     SOCIAL HX: reviewed.    Current Outpatient Medications:    acetaminophen (TYLENOL) 325 MG tablet, Take 650 mg by mouth every 6 (six) hours as needed., Disp: , Rfl:    albuterol (VENTOLIN HFA) 108 (90 Base) MCG/ACT inhaler, INHALE 2 PUFFS FOUR TIMES A DAY, Disp: 8.5 g, Rfl:  11   amLODipine (NORVASC) 5 MG tablet, Take 1 tablet (5 mg total) by mouth daily., Disp: 90 tablet, Rfl: 1   apixaban (ELIQUIS) 5 MG TABS tablet, Take 1 tablet (5 mg total) by mouth 2 (two) times daily., Disp: 60 tablet, Rfl: 2   Blood Glucose Monitoring Suppl (TRUE METRIX METER) w/Device KIT, , Disp: , Rfl:    Budeson-Glycopyrrol-Formoterol (BREZTRI AEROSPHERE) 160-9-4.8 MCG/ACT AERO, Inhale 2 puffs into the lungs in the morning and at bedtime., Disp: 32.1 g, Rfl: 3   CALCIUM PO, Take 600 mg by mouth daily. , Disp: , Rfl:    dicyclomine (BENTYL) 20 MG tablet, Take 20 mg by mouth every 6 (  six) hours as needed., Disp: , Rfl:    DULoxetine (CYMBALTA) 60 MG capsule, Take 1 capsule (60 mg total) by mouth daily., Disp: 90 capsule, Rfl: 1   eszopiclone (LUNESTA) 1 MG TABS tablet, Take 1 tablet (1 mg total) by mouth at bedtime as needed for sleep. Take immediately before bedtime, Disp: 90 tablet, Rfl: 0   gabapentin (NEURONTIN) 300 MG capsule, Take 1 capsule (300 mg total) by mouth at bedtime., Disp: 90 capsule, Rfl: 0   isosorbide mononitrate (IMDUR) 30 MG 24 hr tablet, TAKE 1 TABLET EVERY DAY, Disp: 90 tablet, Rfl: 1   lamoTRIgine (LAMICTAL) 25 MG tablet, TAKE 1 TABLET EVERY DAY FOR MOOD, Disp: 90 tablet, Rfl: 0   Multiple Vitamin (MULTIVITAMIN PO), Take 1 tablet by mouth daily., Disp: , Rfl:    nystatin (MYCOSTATIN/NYSTOP) powder, Apply topically 2 (two) times daily. to affected area(s), Disp: 30 g, Rfl: 0   oxyCODONE (OXY IR/ROXICODONE) 5 MG immediate release tablet, Take 1-2 tablets (5-10 mg total) by mouth every 4 (four) hours as needed for moderate pain (pain score 4-6)., Disp: 20 tablet, Rfl: 0   pantoprazole (PROTONIX) 40 MG tablet, TAKE 1 TABLET TWICE DAILY BEFORE MEALS, Disp: 180 tablet, Rfl: 1   propranolol ER (INDERAL LA) 60 MG 24 hr capsule, Take 1 capsule (60 mg total) by mouth daily., Disp: 90 capsule, Rfl: 3   rosuvastatin (CRESTOR) 20 MG tablet, TAKE 1 TABLET EVERY DAY, Disp: 90 tablet,  Rfl: 1   telmisartan (MICARDIS) 80 MG tablet, TAKE 1 TABLET EVERY DAY, Disp: 90 tablet, Rfl: 1   trimethoprim (TRIMPEX) 100 MG tablet, Take 1 tablet (100 mg total) by mouth daily., Disp: 30 tablet, Rfl: 11   TRUE METRIX BLOOD GLUCOSE TEST test strip, USE AS INSTRUCTED TO CHECK BLOOD SUGARS TWICE DAILY., Disp: 200 strip, Rfl: 1   Vibegron (GEMTESA) 75 MG TABS, Take 75 mg by mouth daily., Disp: 30 tablet, Rfl: 11   famotidine (PEPCID) 40 MG tablet, TAKE 1 TABLET (40 MG TOTAL) BY MOUTH DAILY., Disp: 90 tablet, Rfl: 1  EXAM:  GENERAL: alert, oriented, appears well and in no acute distress  HEENT: atraumatic, conjunttiva clear, no obvious abnormalities on inspection of external nose and ears  NECK: normal movements of the head and neck  LUNGS: on inspection no signs of respiratory distress, breathing rate appears normal, no obvious gross SOB, gasping or wheezing  CV: no obvious cyanosis  PSYCH/NEURO: pleasant and cooperative, no obvious depression or anxiety, speech and thought processing grossly intact  ASSESSMENT AND PLAN:  Discussed the following assessment and plan:  Problem List Items Addressed This Visit     Diabetes ([place])    Last a1c 7.2.  Increased from previous check.  She is concerned that the metformin is causing her diarrhea.  Discussed other possible etiologies, including abx, c.diff, etc.  She is on the extended form.  Will try a trial off and see if diarrhea improves/resolves.  Discussed other treatment options for her diabetes.  Hold on starting GLP1 agonists - concerns regarding GI issues.  Hold SGLT2 inhibitors - given recurring UTIs.  Follow sugars.  Has f/u soon with Catie.  Will reassess sugars at that time.  Call with update regarding her bowels.  Cost is an issue.  May have to place on glipizide.  Follow sugars.  Low carb diet and exercise as tolerated.       Diarrhea    Has been evaluated by GI previously.  Has been a persistent intermittent issue  for her as  outlined.  Was improved (per note) in August.  She is concerned that metformin contributing.  Already on metformin extended release.  Trial off metformin.  Follow bowels.  Discussed if persistent, may need further testing - including stool studies (on chronic suppressive abx), etc.  Call with update.         Return if symptoms worsen or fail to improve.   I discussed the assessment and treatment plan with the patient. The patient was provided an opportunity to ask questions and all were answered. The patient agreed with the plan and demonstrated an understanding of the instructions.   The patient was advised to call back or seek an in-person evaluation if the symptoms worsen or if the condition fails to improve as anticipated.    Einar Pheasant, MD

## 2021-11-15 ENCOUNTER — Ambulatory Visit: Payer: Medicare HMO | Admitting: Psychiatry

## 2021-11-15 ENCOUNTER — Telehealth: Payer: Self-pay | Admitting: Psychiatry

## 2021-11-15 NOTE — Telephone Encounter (Signed)
Patient LVM to cancel appt. Stated spouse had a bad fall and she would call back in to resch

## 2021-11-18 ENCOUNTER — Encounter: Payer: Self-pay | Admitting: Internal Medicine

## 2021-11-20 ENCOUNTER — Encounter: Payer: Self-pay | Admitting: Internal Medicine

## 2021-11-20 NOTE — Telephone Encounter (Signed)
See other message.  Agree with decreasing sugar drinks.  Needs water =- see previous message.  Also low carb diet.  Continue to monitor.  Has f/u with Catie on 11/23/21.  Will review more sugars then. Confirm doing ok otherwise.

## 2021-11-20 NOTE — Telephone Encounter (Signed)
Agree with stopping the mountain dew.  I would recommend holding on drinking a lot of diet mountain dew as well.  Artificial sweeteners can aggravate bowels.  Recommend water.  Also, do you have a list of sugar readings.  I do not see them attached.

## 2021-11-21 NOTE — Telephone Encounter (Signed)
See other message. Has f/u with catie on 12/30

## 2021-11-23 ENCOUNTER — Ambulatory Visit (INDEPENDENT_AMBULATORY_CARE_PROVIDER_SITE_OTHER): Payer: Medicare HMO | Admitting: Pharmacist

## 2021-11-23 ENCOUNTER — Telehealth: Payer: Self-pay | Admitting: Pharmacist

## 2021-11-23 DIAGNOSIS — I480 Paroxysmal atrial fibrillation: Secondary | ICD-10-CM

## 2021-11-23 DIAGNOSIS — J449 Chronic obstructive pulmonary disease, unspecified: Secondary | ICD-10-CM

## 2021-11-23 DIAGNOSIS — E1159 Type 2 diabetes mellitus with other circulatory complications: Secondary | ICD-10-CM

## 2021-11-23 DIAGNOSIS — I2510 Atherosclerotic heart disease of native coronary artery without angina pectoris: Secondary | ICD-10-CM

## 2021-11-23 MED ORDER — OZEMPIC (0.25 OR 0.5 MG/DOSE) 2 MG/1.5ML ~~LOC~~ SOPN: PEN_INJECTOR | SUBCUTANEOUS | 2 refills | Status: DC

## 2021-11-23 MED ORDER — BREZTRI AEROSPHERE 160-9-4.8 MCG/ACT IN AERO: 2.0000 | INHALATION_SPRAY | Freq: Two times a day (BID) | RESPIRATORY_TRACT | 3 refills | Status: DC

## 2021-11-23 NOTE — Assessment & Plan Note (Signed)
Last a1c 7.2.  Increased from previous check.  She is concerned that the metformin is causing her diarrhea.  Discussed other possible etiologies, including abx, c.diff, etc.  She is on the extended form.  Will try a trial off and see if diarrhea improves/resolves.  Discussed other treatment options for her diabetes.  Hold on starting GLP1 agonists - concerns regarding GI issues.  Hold SGLT2 inhibitors - given recurring UTIs.  Follow sugars.  Has f/u soon with Catie.  Will reassess sugars at that time.  Call with update regarding her bowels.  Cost is an issue.  May have to place on glipizide.  Follow sugars.  Low carb diet and exercise as tolerated.

## 2021-11-23 NOTE — Telephone Encounter (Signed)
**Note Kathryn-Identified via Obfuscation** Medication Samples have been labeled and logged for the patient.  Drug name: Ozempic       Strength: 2 mg/1.5 mL        Qty: 1  LOT: [id number]  Exp.Date: 04/24/2024  Dosing instructions: Inject 0.25 mg weekly for 4 weeks then increase to 0.5 mg weekly  The patient has been instructed regarding the correct time, dose, and frequency of taking this medication, including desired effects and most common side effects.   Kathryn Friedman 10:17 AM 11/23/2021

## 2021-11-23 NOTE — Addendum Note (Signed)
Addended by: De Hollingshead on: 11/23/2021 10:10 AM   Modules accepted: Orders

## 2021-11-23 NOTE — Patient Instructions (Addendum)
Start Ozempic 0.25 mg once weekly for 4 weeks, then increase to 0.5 mg weekly.   This medication may cause stomach upset, queasiness, or constipation, especially when first starting. This generally improves over time. Call our office if these symptoms occur and worsen, or if you have severe symptoms such as vomiting, diarrhea, or stomach pain.   Check your blood sugars twice daily:  1) Fasting, first thing in the morning before breakfast and  2) 2 hours after your largest meal.   For a goal A1c of less than 7%, goal fasting readings are less than 130 and goal 2 hour after meal readings are less than 180.   We will pursue assistance for Ozempic from the manufacturer, [employer].   We recommend you get the updated bivalent COVID-19 booster, at least 2 months after any prior doses. You may consider delaying a booster dose by 3 months from a prior episode of COVID-19 per the CDC.   We also recommend everyone over the age of 14 pursue the Shingrix (shingles) vaccine. This should have a $0 copay on all Medicare plans in 2023.   If you are interested at receiving this at one of our [facility], you can go to [facility] - phone is [phone]. They are located in the [employer] on the hospital campus.   Take care!  [name], PharmD  Visit Information  Following are the goals we discussed today:  Patient Goals/Self-Care Activities Over the next 90 days, patient will:  - take medications as prescribed collaborate with provider on medication access solutions          Plan: Telephone follow up appointment with care management team member scheduled for:  6 weeks   [name], PharmD, [place], CPP Clinical Pharmacist [place] [facility] [phone]   Please call the care guide team at [phone] if you need to cancel or reschedule your appointment.   Patient verbalizes understanding of instructions provided today and agrees to  view in [place].

## 2021-11-23 NOTE — Assessment & Plan Note (Signed)
Has been evaluated by GI previously.  Has been a persistent intermittent issue for her as outlined.  Was improved (per note) in August.  She is concerned that metformin contributing.  Already on metformin extended release.  Trial off metformin.  Follow bowels.  Discussed if persistent, may need further testing - including stool studies (on chronic suppressive abx), etc.  Call with update.

## 2021-11-23 NOTE — Chronic Care Management (AMB) (Signed)
Chronic Care Management CCM Pharmacy Note  11/23/2021 Name:  Kathryn Friedman MRN:  [id number] DOB:  01-14-1956  Summary: - Improvement in diarrhea after stopping metformin; elevated fasting readings  Recommendations/Changes made from today's visit:- - start Ozempic 0.25 mg weekly for 4 weeks, then increase to 0.5 mg weekly  Subjective: Kathryn Friedman is an 65 y.o. year old female who is a primary patient of Einar Pheasant, MD.  The CCM team was consulted for assistance with disease management and care coordination needs.    Engaged with patient by telephone for follow up visit for pharmacy case management and/or care coordination services.   Objective:  Medications Reviewed Today     Reviewed by Einar Pheasant, MD (Physician) on 11/23/21 at [place] List Status: <None>   Medication Order Taking? Sig Documenting Provider Last Dose Status Informant  acetaminophen (TYLENOL) 325 MG tablet [id number] Yes Take 650 mg by mouth every 6 (six) hours as needed. [provider] Taking Active Self  albuterol (VENTOLIN HFA) 108 (90 Base) MCG/ACT inhaler [id number] Yes INHALE 2 PUFFS FOUR TIMES A Lemont Fillers, MD Taking Active            Med Note Mayo Ao Aug 06, 2021 11:24 AM)    amLODipine (NORVASC) 5 MG tablet [id number] Yes Take 1 tablet (5 mg total) by mouth daily. Einar Pheasant, MD Taking Active Self  apixaban (ELIQUIS) 5 MG TABS tablet [id number] Yes Take 1 tablet (5 mg total) by mouth 2 (two) times daily. Einar Pheasant, MD Taking Active   Blood Glucose Monitoring Suppl (TRUE METRIX METER) w/Device Drucie Opitz [id number] Yes  [provider] Taking Active Self  Budeson-Glycopyrrol-Formoterol (BREZTRI AEROSPHERE) 160-9-4.8 MCG/ACT AERO [id number] Yes Inhale 2 puffs into the lungs in the morning and at bedtime. Einar Pheasant, MD Taking Active Self  CALCIUM PO [id number] Yes Take 600 mg by mouth daily.  [provider] Taking  Active Self  dicyclomine (BENTYL) 20 MG tablet [id number] Yes Take 20 mg by mouth every 6 (six) hours as needed. [provider] Taking Active            Med Note Darnelle Maffucci, Arville Lime   Fri Nov 23, 2021  9:30 AM) Taking TID  DULoxetine (CYMBALTA) 60 MG capsule [id number] Yes Take 1 capsule (60 mg total) by mouth daily. Einar Pheasant, MD Taking Active Self  eszopiclone (LUNESTA) 1 MG TABS tablet [id number] Yes Take 1 tablet (1 mg total) by mouth at bedtime as needed for sleep. Take immediately before bedtime Ursula Alert, MD Taking Active   famotidine (PEPCID) 40 MG tablet [id number] No TAKE 1 TABLET (40 MG TOTAL) BY MOUTH DAILY. Einar Pheasant, MD Not Taking Active   gabapentin (NEURONTIN) 300 MG capsule [id number] Yes Take 1 capsule (300 mg total) by mouth at bedtime. Ursula Alert, MD Taking Active   isosorbide mononitrate (IMDUR) 30 MG 24 hr tablet [id number] Yes TAKE 1 TABLET EVERY DAY Einar Pheasant, MD Taking Active   lamoTRIgine (LAMICTAL) 25 MG tablet [id number] Yes TAKE 1 TABLET EVERY DAY FOR MOOD Eappen, Saramma, MD Taking Active   Multiple Vitamin (MULTIVITAMIN PO) [id number] Yes Take 1 tablet by mouth daily. [provider] Taking Active Self  nystatin (MYCOSTATIN/NYSTOP) powder [id number] Yes Apply topically 2 (two) times daily. to affected area(s) Einar Pheasant, MD Taking Active   oxyCODONE (OXY IR/ROXICODONE) 5 MG immediate release tablet [id number] Yes Take 1-2 tablets (5-10 mg total) by mouth every 4 (four) hours as needed for  moderate pain (pain score 4-6). Sidney Ace, MD Taking Active            Med Note Mayo Ao Aug 06, 2021 11:28 AM) 1-2 daily  pantoprazole (PROTONIX) 40 MG tablet [id number] Yes TAKE 1 TABLET TWICE DAILY BEFORE MEALS Einar Pheasant, MD Taking Active   propranolol ER (INDERAL LA) 60 MG 24 hr capsule [id number] Yes Take 1 capsule (60 mg total) by mouth daily. Minna Merritts, MD Taking Active   rosuvastatin (CRESTOR)  20 MG tablet [id number] Yes TAKE 1 TABLET EVERY DAY Einar Pheasant, MD Taking Active   telmisartan (MICARDIS) 80 MG tablet [id number] Yes TAKE 1 TABLET EVERY DAY Einar Pheasant, MD Taking Active   trimethoprim (TRIMPEX) 100 MG tablet [id number] Yes Take 1 tablet (100 mg total) by mouth daily. Bjorn Loser, MD Taking Active   TRUE METRIX BLOOD GLUCOSE TEST test strip [id number] Yes USE AS INSTRUCTED TO CHECK BLOOD SUGARS TWICE DAILY. Einar Pheasant, MD Taking Active   Vibegron [facility]) 75 MG TABS [id number] Yes Take 75 mg by mouth daily. Bjorn Loser, MD Taking Active             Pertinent Labs:   Lab Results  Component Value Date   HGBA1C 7.2 (H) 10/22/2021   Lab Results  Component Value Date   CHOL 120 10/22/2021   HDL 43.40 10/22/2021   LDLCALC 51 10/22/2021   LDLDIRECT 103.0 03/30/2021   TRIG 128.0 10/22/2021   CHOLHDL 3 10/22/2021   Lab Results  Component Value Date   CREATININE 0.63 10/22/2021   BUN 5 (L) 10/22/2021   NA 137 10/22/2021   K 3.8 10/22/2021   CL 100 10/22/2021   CO2 26 10/22/2021    SDOH:  (Social Determinants of Health) assessments and interventions performed:  SDOH Interventions    Flowsheet Row Most Recent Value  SDOH Interventions   Financial Strain Interventions Other (Comment)  [manufacturer assistance]       CCM Care Plan  Review of patient past medical history, allergies, medications, health status, including review of consultants reports, laboratory and other test data, was performed as part of comprehensive evaluation and provision of chronic care management services.   Care Plan : General Pharmacy (Adult)  Updates made by De Hollingshead, RPH-CPP since 11/23/2021 12:00 AM     Problem: COPD, CAD, HLD, HTN      Long-Range Goal: Disease Progression Prevention   Recent Progress: On track  Priority: High  Note:   Current Barriers:  Unable to independently afford treatment regimen Complex patient with multiple  comorbidities including diabetes, coronary artery disease, COPD, depression/anxiety  Pharmacist Clinical Goal(s):  Over the next 90 days, patient will verbalize ability to afford treatment regimen. Over the next 90 days, patient will adhere to prescribed medication regimen  Interventions: 1:1 collaboration with Einar Pheasant, MD regarding development and update of comprehensive plan of care as evidenced by provider attestation and co-signature Inter-disciplinary care team collaboration (see longitudinal plan of care) Comprehensive medication review performed; medication list updated in electronic medical record  Health Maintenance Yearly diabetic eye exam: up to date Yearly diabetic foot exam: up to date Urine microalbumin: up to date Yearly influenza vaccination: up to date  Td/Tdap vaccination: up to date Pneumonia vaccination: due - recommend with next PCP visit COVID vaccinations: due for bivalent booster- recommended to pursue Shingrix vaccinations: due-recommended to pursue Colonoscopy: up to date Bone density scan: due - scheduled  Diabetes: Uncontrolled; current treatment: none Stopped  metformin XR, resolution in diarrhea  Hx Jardiance - yeast infections Current glucose readings: fastings: 140-150s; 2 hour post prandial: 170-180s (after stopping regular Mt Dew) Counseled on GLP1 agonists, including mechanism of action, side effects, and benefits. No personal or family history of medullary thyroid cancer, personal history of pancreatitis or gallbladder disease. Counseled on potential side effects of nausea, stomach upset, queasiness, constipation, and that these generally improve over time. Advised to contact our office with more severe symptoms, including nausea, diarrhea, stomach pain. Patient verbalized understanding. Start Ozempic 0.25 mg weekly for 4 weeks, then increase to 0.5 mg weekly. Will pursue patient assistance.   Hypertension, Coronary Artery Disease, new  diagnosis of AFib Appropriately managed, follows w/ Dr. Nehemiah Massed, but plans to transition to Dr. Rockey Situ; current treatment:  Rate control: propranolol 40 mg daily  Anticoagulation: Eliquis 5 mg BID Additional antihypertensives/antianginals: amlodipine 5 mg daily, isosorbide 30 mg daily, telmisartan 80 mg daily Approved for Eliquis patient assistance through 11/24/21. Unable to apply for 2023 assistance until patient meets 3% of household income in prescription out of pocket spend  Previously recommended to continue current regimen at this time  Hyperlipidemia, ASCVD Risk Reduction Controlled per last lipid panel; current treatment: atorvastatin 20 mg daily Antiplatelet therapy: none given concurrent anticoagulation Previously recommended to continue current regimen at this time  Chronic Obstructive Pulmonary Disease: Controlled; current treatment: Breztri 160/9/4.8 mcg 2 puffs BID; albuterol HFA PRN - 1-2 times daily due to wheezing and SOB; follows w/ Dr. Raul Del Receiving [place] through [facility] patient assistance. Will send refill to [employer] for 2023.  Previously recommended to continue current regimen at this time along with pulmonology collaboration  Depression/Anxiety/Insomnia/Pain: Exacerbated by husband's hospice status; follows w/ Dr. Shea Evans. Current regimen: lamotrigine 25 mg daily; duloxetine 60 mg daily; gabapentin 300 mg QPM, eszopiclone 1 mg QPM Recommended to continue current regimen at this time along with psychiatry follow up  Pain, s/p recent hip replacement with subsequent fracture Uncontrolled; current treatment: acetaminophen 500 mg BID PRN,  oxycodone 5 mg 1-2 tablets Q4H s/p hip replacement, using 1-2 doses daily  Upcoming appointment with neurosurgery Previously recommended to continue current regimen at this time  Diarrhea, chronic with GERD: Moderately well controlled per patient report; Current regimen: pantoprazole 40 mg BID, famotidine 40 mg daily, dicyclomine  10 mg PRN - using 3 times daily  Recommended to continue current regimen at this time.   Overactive Bladder: Managed per patient report; current regimen: Gemtesa 75 mg daily  Previously on tolterodine, beneficial at first but then waned over time Myrbetriq too expensive on her insurance Oxybutynin did not have lasting benefit Unfortunately, no patient assistance programs for Gemtesa at this time. Recommended to continue to collaborate with Dr. Dene Gentry office for access needs.   Tobacco Abuse: 1-1.5 packs per day; 50 years of use Previous quit attempts: unsuccessful using varenicline (nightmares) Triggers to smoke: stress, familial stress lately as well as her own health concerns.  Previously encouraged to start nicotine patches + gum when ready to quit.   Patient Goals/Self-Care Activities Over the next 90 days, patient will:  - take medications as prescribed collaborate with provider on medication access solutions       Plan: Telephone follow up appointment with care management team member scheduled for:  6 weeks  [name], PharmD, [place], [place] Pharmacist [employer] at [employer] [phone]

## 2021-11-24 DIAGNOSIS — I480 Paroxysmal atrial fibrillation: Secondary | ICD-10-CM

## 2021-11-24 DIAGNOSIS — J449 Chronic obstructive pulmonary disease, unspecified: Secondary | ICD-10-CM | POA: Diagnosis not present

## 2021-11-24 DIAGNOSIS — E1159 Type 2 diabetes mellitus with other circulatory complications: Secondary | ICD-10-CM | POA: Diagnosis not present

## 2021-11-24 DIAGNOSIS — I2510 Atherosclerotic heart disease of native coronary artery without angina pectoris: Secondary | ICD-10-CM | POA: Diagnosis not present

## 2021-11-28 ENCOUNTER — Telehealth: Payer: Self-pay | Admitting: Pharmacy Technician

## 2021-11-28 DIAGNOSIS — Z596 Low income: Secondary | ICD-10-CM

## 2021-11-28 NOTE — Progress Notes (Signed)
[place] [facility])                                            [place] Team    11/28/2021  JUNICE FEI [birth year] [id number]                                      Medication Assistance Referral  Referral From: [facility] Embedded RPh Catie T.   Medication/Company: Larna Daughters / Novo Nordisk Patient application portion: Gaffer portion: Interoffice Mailed to Dr. Nicki Reaper Provider address/fax verified via: Office website  Medication/Company: Judithann Sauger / [place] Patient application portion:  N/A patient was automatically re enrolled for 2023 per [place] guidelines of patient having Med and being approved in 4081 Provider application portion: Interoffice Mailed to Dr. Nicki Reaper Provider address/fax verified via: Office website  Received both patient and provider portion(s) of patient assistance application(s) for Ozempic. Faxed completed application and required documents into [employer].  Since patient did not have to reapply for 2023 as outlined above, embedded PharmD escribed new update prescription to [place].   Jasmina Gendron P. Mizael Sagar, [place]  [phone]

## 2021-12-02 ENCOUNTER — Encounter: Payer: Self-pay | Admitting: Internal Medicine

## 2021-12-04 NOTE — Telephone Encounter (Signed)
Called patient to confirm doing ok. Patient was crying on the phone but stated she did not need anything at this time and would reach out if she felt like there was anything we could do.

## 2021-12-06 ENCOUNTER — Telehealth: Payer: Self-pay | Admitting: Pharmacist

## 2021-12-06 ENCOUNTER — Telehealth: Payer: Self-pay | Admitting: Pharmacy Technician

## 2021-12-06 DIAGNOSIS — Z596 Low income: Secondary | ICD-10-CM

## 2021-12-06 DIAGNOSIS — J449 Chronic obstructive pulmonary disease, unspecified: Secondary | ICD-10-CM

## 2021-12-06 MED ORDER — BREZTRI AEROSPHERE 160-9-4.8 MCG/ACT IN AERO: 2.0000 | INHALATION_SPRAY | Freq: Two times a day (BID) | RESPIRATORY_TRACT | 3 refills | Status: AC

## 2021-12-06 NOTE — Telephone Encounter (Signed)
Updated Breztri prescription needed for [employer] PAP. Sent to [facility] today

## 2021-12-06 NOTE — Progress Notes (Signed)
[place] [facility]                                            [place] Team    12/06/2021  Kathryn Friedman [birth year] [id number]  2 care coordination call placed to [place] in regard to [place] application and to [place]ME in regard to [facility] application.  Spoke to [place] at [employer] who informs patient is APPROVED 12/06/2021-11/24/2022. She informs refills will process based on last fill dates in 2022 and be delivered to the provider's office. Once refills are sent to processing based on that date it will then take 30 days to arrive at the provider's office.  Spoke to [place] at [place] who informs patient is APPROVED 11/25/21-11/24/22. She informs refills will process based on last fill dates in 2022 and be delivered to the patient's home.  Martie Fulgham P. Lupe Handley, [place]  [phone]

## 2021-12-10 ENCOUNTER — Other Ambulatory Visit: Payer: Self-pay | Admitting: Internal Medicine

## 2021-12-11 ENCOUNTER — Ambulatory Visit: Payer: Medicare HMO | Admitting: Psychiatry

## 2021-12-18 ENCOUNTER — Other Ambulatory Visit: Payer: Self-pay | Admitting: Psychiatry

## 2021-12-18 DIAGNOSIS — F418 Other specified anxiety disorders: Secondary | ICD-10-CM

## 2021-12-18 DIAGNOSIS — G4701 Insomnia due to medical condition: Secondary | ICD-10-CM

## 2021-12-25 NOTE — Telephone Encounter (Signed)
Patient called in to speak with Catie only has 2 shots of Ozempic left and requesting more samples and want a status on assistance . [place] call patient @ [phone]

## 2021-12-25 NOTE — Telephone Encounter (Signed)
LMTCB

## 2021-12-25 NOTE — Telephone Encounter (Signed)
Reviewed Kathryn Friedman's note.  Please call and confirm she is doing ok.  Ok to schedule an appt with me tomorrow if she feels needs to be seen.  Agree with keeping psychiatry appt.  Per Kathryn Friedman, emergency number given if needed.

## 2021-12-25 NOTE — Telephone Encounter (Signed)
Called patient.   She tearfully recounts to me that her husband passed away earlier this month. She also notes a brother in law passed away this month as well, but she and her sister (lives in [place]) are not very close. Also reports that a good friend of hers is no longer close due to a disagreement.   She reports feeling very alone and just wanting Joe to be back. Does confirm that she has thoughts of hurting herself so that she can be with him again, but she has no plan or intent. Several times on the call she assured me that she will not hurt herself because she did not want to do that to her son, [name] (from first marriage, reports he lives in [place] too, but that he works all the time so she does not see him often). Offered CCM LCSW referral, she declines, noting that she doesn't want to talk to anyone about it. Offered acute PCP visit, she declines.   Discussed talking with her sister, who also recently lost her husband. She notes their relationship is not close enough to feel she could call her. Discussed letting her son know that she is struggling, she declines to burden him.   Provided patient with information about 988 mental health hotline. Advised patient to call if thoughts worsen, turn into an active plan or active intent to hurt herself.   She has follow up with Dr. Shea Evans on 12/27/21. She confirms that she will keep that appointment.   Routing to PCP. Also discussed in person with PCP   Medication Samples have been labeled and logged for to the patient.  Drug name: Ozempic       Strength: 2 mg/1.5 mL        Qty: 1 pen  LOT: [id number]  Exp.Date: 04/24/24  Dosing instructions: Inject 0.5 mg weekly   The patient has been instructed regarding the correct time, dose, and frequency of taking this medication, including desired effects and most common side effects.   De Hollingshead 2:06 PM 12/25/2021

## 2021-12-27 ENCOUNTER — Encounter: Payer: Self-pay | Admitting: Psychiatry

## 2021-12-27 ENCOUNTER — Other Ambulatory Visit: Payer: Self-pay

## 2021-12-27 ENCOUNTER — Telehealth (INDEPENDENT_AMBULATORY_CARE_PROVIDER_SITE_OTHER): Payer: Medicare HMO | Admitting: Psychiatry

## 2021-12-27 DIAGNOSIS — Z634 Disappearance and death of family member: Secondary | ICD-10-CM | POA: Diagnosis not present

## 2021-12-27 DIAGNOSIS — F418 Other specified anxiety disorders: Secondary | ICD-10-CM | POA: Diagnosis not present

## 2021-12-27 DIAGNOSIS — F331 Major depressive disorder, recurrent, moderate: Secondary | ICD-10-CM

## 2021-12-27 DIAGNOSIS — F17200 Nicotine dependence, unspecified, uncomplicated: Secondary | ICD-10-CM | POA: Diagnosis not present

## 2021-12-27 DIAGNOSIS — G4701 Insomnia due to medical condition: Secondary | ICD-10-CM | POA: Diagnosis not present

## 2021-12-27 MED ORDER — ESZOPICLONE 1 MG PO TABS: 2.0000 mg | ORAL_TABLET | Freq: Every evening | ORAL | 1 refills | Status: DC | PRN

## 2021-12-27 NOTE — Telephone Encounter (Signed)
LMTCB

## 2021-12-27 NOTE — Progress Notes (Signed)
Virtual Visit via Video Note  I connected with Kathryn Friedman on 12/27/21 at  3:40 PM EST by a video enabled telemedicine application and verified that I am speaking with the correct person using two identifiers.  Location Provider Location : [facility] Patient Location : Home  Participants: Patient , Provider    I discussed the limitations of evaluation and management by telemedicine and the availability of in person appointments. The patient expressed understanding and agreed to proceed.   I discussed the assessment and treatment plan with the patient. The patient was provided an opportunity to ask questions and all were answered. The patient agreed with the plan and demonstrated an understanding of the instructions.   The patient was advised to call back or seek an in-person evaluation if the symptoms worsen or if the condition fails to improve as anticipated.   [facility] MD OP Progress Note  12/28/2021 8:17 AM Kathryn Friedman  MRN:  [id number]  Chief Complaint:  Chief Complaint   Follow-up 66 year old Caucasian female with history of MDD, anxiety disorder, insomnia, recently lost her husband a few weeks ago, presents with grief reaction, sleep problems presents for medication management.    HPI: Kathryn Friedman is a 66 year old Caucasian female, widowed, retired, on [employer], lives in [place], has a history of MDD, insomnia, anxiety disorder, tobacco use disorder, multiple medical problems including obstructive sleep apnea on CPAP, eye surgery, coronary artery disease status post stent placement, COPD, diabetes mellitus, hypertension, GERD, interstitial lung disease, iron deficiency was evaluated by telemedicine today.  Patient today tearful in session.  Patient's husband passed away on 12/31/22.  Patient became very depressed and tearful when she discussed his passing.  Patient currently struggling with significant grief.  Patient reports she is struggling with sleep  problems, low appetite, sadness, low motivation, anhedonia.  This has been going on since the past few weeks.  Patient does have a son who lives nearby however patient reports he is busy with work most of the time.  She also reports she does not want to be in the company of anyone at this time and wants to spend her time alone.  She is not interested in grief counseling at this time.  She however does report hospice did talk to her once after the death of her husband.  Patient is compliant on medications.  Reports she is no longer on the Cymbalta and does not know when this was stopped.  She continues to take Lunesta 1 mg at bedtime.  Agreeable to dosage increase.  Compliant on her other medications.  Denies side effects.  Denies suicidality however does report she has fleeting thoughts of wanting to be with her husband.  However denies any active suicidal thoughts or plan.  Denies perceptual disturbances or homicidality.  Denies any other concerns today.  Visit Diagnosis:    ICD-10-CM   1. MDD (major depressive disorder), recurrent episode, moderate (HCC)  F33.1     2. Insomnia due to medical condition  G47.01    grief    3. Other specified anxiety disorders  F41.8    with limited symptom attacks    4. Bereavement  Z63.4 eszopiclone (LUNESTA) 1 MG TABS tablet    5. Tobacco use disorder  F17.200       Past Psychiatric History: Reviewed past psychiatric history from progress note on 06/26/2020.  Past trials of trazodone, Cymbalta, Lexapro, Rexulti, doxepin.  Patient was under the care of [facility] in the past.  Past Medical History:  Past Medical History:  Diagnosis Date   Anemia    Anginal pain (HCC)    Anxiety    Arthritis    back and knees   Asthma    Bilateral carotid artery stenosis    Blood in stool    Chronic diarrhea    COPD (chronic obstructive pulmonary disease) (HCC)    Coronary artery disease    a.) 75% pRCA; 3.5 x 28 mm Cypher DES placed on  07/24/2006   Current use of long term anticoagulation    Clopidogrel   Depression    secondary to the death of her husband (died 4)   Diverticulitis    Diverticulosis    Dizzinesses    Dysphagia    Dyspnea    Fatty infiltration of liver    GERD (gastroesophageal reflux disease)    Headache    History of 2019 novel coronavirus disease (COVID-19) 12/09/2020   History of 2019 novel coronavirus disease (COVID-19) 12/20/2020   Hypertension    Hypertriglyceridemia    ILD (interstitial lung disease) ([place])    Lump in the abdomen    OSA on CPAP    Overactive bladder    PSVT (paroxysmal supraventricular tachycardia) (HCC)    Spastic colon    T2DM (type 2 diabetes mellitus) ([place]) 05/2008   Tobacco abuse    Venous insufficiency of both lower extremities     Past Surgical History:  Procedure Laterality Date   ABDOMINAL HYSTERECTOMY  with left ovary in place [place] Left 10/14/2017   calcs bx, fibrosis giant cell reaction and chronic inflammation, negative for malignancy.    CATARACT EXTRACTION, BILATERAL     CESAREAN SECTION  1984   CHOLECYSTECTOMY  1985   COLONOSCOPY WITH PROPOFOL N/A 09/13/2016   Procedure: COLONOSCOPY WITH PROPOFOL;  Surgeon: Manya Silvas, MD;  Location: [facility] ENDOSCOPY;  Service: Endoscopy;  Laterality: N/A;   COLONOSCOPY WITH PROPOFOL N/A 11/09/2018   Procedure: COLONOSCOPY WITH PROPOFOL;  Surgeon: Manya Silvas, MD;  Location: [facility] ENDOSCOPY;  Service: Endoscopy;  Laterality: N/A;   COLONOSCOPY WITH PROPOFOL N/A 03/29/2020   Procedure: COLONOSCOPY WITH PROPOFOL;  Surgeon: Robert Bellow, MD;  Location: [facility] ENDOSCOPY;  Service: Endoscopy;  Laterality: N/A;   CORONARY ANGIOPLASTY WITH STENT PLACEMENT N/A 07/24/2006   75% pRCA; 3.5 x 28 mm Cypher DES placed; Location: [place]; Surgeons: Katrine Coho, MD   ESOPHAGOGASTRODUODENOSCOPY (EGD) WITH PROPOFOL N/A 02/02/2018   Procedure: ESOPHAGOGASTRODUODENOSCOPY (EGD) WITH  PROPOFOL;  Surgeon: Manya Silvas, MD;  Location: [facility] ENDOSCOPY;  Service: Endoscopy;  Laterality: N/A;   ESOPHAGOGASTRODUODENOSCOPY (EGD) WITH PROPOFOL N/A 03/29/2020   Procedure: ESOPHAGOGASTRODUODENOSCOPY (EGD) WITH PROPOFOL;  Surgeon: Robert Bellow, MD;  Location: [facility] ENDOSCOPY;  Service: Endoscopy;  Laterality: N/A;   EYE SURGERY     JOINT REPLACEMENT     bilateral knee replacements   KNEE ARTHROSCOPY  Arthroscopic left knee surgery    KNEE SURGERY  status post knee surgey    LEFT HEART CATH AND CORONARY ANGIOGRAPHY Left 05/14/2018   Procedure: LEFT HEART CATH AND CORONARY ANGIOGRAPHY;  Surgeon: Corey Skains, MD;  Location: [facility];  Service: Cardiovascular;  Laterality: Left;   REPLACEMENT TOTAL KNEE  ([facility])   SHOULDER SURGERY  shoulder operation secondary to a torn tendon   TOTAL HIP ARTHROPLASTY Left 06/12/2021   Procedure: TOTAL HIP ARTHROPLASTY;  Surgeon: Corky Mull, MD;  Location: [facility] ORS;  Service: Orthopedics;  Laterality: Left;    Family Psychiatric History: Reviewed family psychiatric history from progress note on 06/26/2020.  Family History:  Family History  Problem Relation Age of Onset   Other Mother        Hit by a fire truck and has had multiple operations on her back , and has history of MVP    Mitral valve prolapse Mother    Lung cancer Mother    Depression Mother    Heart disease Father        myocardial infarction and is status post bypass surgery   Mitral valve prolapse Sister    Bipolar disorder Sister    Hepatitis C Brother    Cirrhosis Brother    Colon cancer Paternal Aunt    Breast cancer Neg Hx    Prostate cancer Neg Hx    Bladder Cancer Neg Hx    Kidney cancer Neg Hx     Social History: Reviewed social history from progress note on 06/26/2020.  Patient is currently widowed. Social History   Socioeconomic History   Marital status: Widowed    Spouse name: Lamerle Jabs   Number of children: 1   Years of education: 12    Highest education level: 12th grade  Occupational History    Employer: [employer]  Tobacco Use   Smoking status: Every Day    Packs/day: 1.50    Years: 45.00    Pack years: 67.50    Types: Cigarettes    Passive exposure: Current   Smokeless tobacco: Never  Vaping Use   Vaping Use: Former  Substance and Sexual Activity   Alcohol use: No    Alcohol/week: 0.0 standard drinks   Drug use: No   Sexual activity: Not Currently  Other Topics Concern   Not on file  Social History Narrative   Lives with husband   Social Determinants of Health   Financial Resource Strain: Medium Risk   Difficulty of Paying Living Expenses: Somewhat hard  Food Insecurity: No Food Insecurity   Worried About Charity fundraiser in the Last Year: Never true   Ran Out of Food in the Last Year: Never true  Transportation Needs: No Transportation Needs   Lack of Transportation (Medical): No   Lack of Transportation (Non-Medical): No  Physical Activity: Inactive   Days of Exercise per Week: 0 days   Minutes of Exercise per Session: 0 min  Stress: No Stress Concern Present   Feeling of Stress : Only a little  Social Connections: Moderately Integrated   Frequency of Communication with Friends and Family: More than three times a week   Frequency of Social Gatherings with Friends and Family: More than three times a week   Attends Religious Services: 1 to 4 times per year   Active Member of [employer] or Organizations: No   Attends Archivist Meetings: Never   Marital Status: Married    Allergies:  Allergies  Allergen Reactions   Varenicline Other (See Comments)    "I got really depressed" "I got really depressed" CHANTEX   Jardiance [Empagliflozin] Other (See Comments)    Yeast infection   Metformin And Related Other (See Comments)    Diarrhea, even with XR   Methylprednisolone Nausea Only and Nausea And Vomiting    Metabolic Disorder Labs: Lab Results  Component Value Date   HGBA1C 7.2 (H)  10/22/2021   No results found for: PROLACTIN Lab Results  Component Value Date   CHOL 120 10/22/2021   TRIG 128.0  10/22/2021   HDL 43.40 10/22/2021   CHOLHDL 3 10/22/2021   VLDL 25.6 10/22/2021   LDLCALC 51 10/22/2021   LDLCALC 41 07/06/2021   Lab Results  Component Value Date   TSH 0.87 07/06/2021   TSH 1.56 03/17/2020    Therapeutic Level Labs: No results found for: LITHIUM No results found for: VALPROATE No components found for:  CBMZ  Current Medications: Current Outpatient Medications  Medication Sig Dispense Refill   oxyCODONE (OXY IR/ROXICODONE) 5 MG immediate release tablet Take by mouth.     acetaminophen (TYLENOL) 325 MG tablet Take 650 mg by mouth every 6 (six) hours as needed.     albuterol (VENTOLIN HFA) 108 (90 Base) MCG/ACT inhaler INHALE 2 PUFFS FOUR TIMES A DAY 8.5 g 11   amLODipine (NORVASC) 5 MG tablet TAKE 1 TABLET EVERY DAY 90 tablet 1   apixaban (ELIQUIS) 5 MG TABS tablet Take 1 tablet (5 mg total) by mouth 2 (two) times daily. 60 tablet 2   Blood Glucose Monitoring Suppl (TRUE METRIX METER) w/Device KIT      Budeson-Glycopyrrol-Formoterol (BREZTRI AEROSPHERE) 160-9-4.8 MCG/ACT AERO Inhale 2 puffs into the lungs in the morning and at bedtime. 32.1 g 3   CALCIUM PO Take 600 mg by mouth daily.      dicyclomine (BENTYL) 20 MG tablet Take 20 mg by mouth every 6 (six) hours as needed.     DULoxetine (CYMBALTA) 60 MG capsule Take 1 capsule (60 mg total) by mouth daily. 90 capsule 1   eszopiclone (LUNESTA) 1 MG TABS tablet Take 2 tablets (2 mg total) by mouth at bedtime as needed for sleep. Take immediately before bedtime 90 tablet 1   famotidine (PEPCID) 40 MG tablet TAKE 1 TABLET (40 MG TOTAL) BY MOUTH DAILY. 90 tablet 1   gabapentin (NEURONTIN) 300 MG capsule TAKE 1 CAPSULE AT BEDTIME 90 capsule 1   isosorbide mononitrate (IMDUR) 30 MG 24 hr tablet TAKE 1 TABLET EVERY DAY 90 tablet 1   lamoTRIgine (LAMICTAL) 25 MG tablet TAKE 1 TABLET EVERY DAY FOR MOOD 90  tablet 0   Multiple Vitamin (MULTIVITAMIN PO) Take 1 tablet by mouth daily.     nystatin (MYCOSTATIN/NYSTOP) powder Apply topically 2 (two) times daily. to affected area(s) 30 g 0   oxyCODONE (OXY IR/ROXICODONE) 5 MG immediate release tablet Take 1-2 tablets (5-10 mg total) by mouth every 4 (four) hours as needed for moderate pain (pain score 4-6). 20 tablet 0   pantoprazole (PROTONIX) 40 MG tablet TAKE 1 TABLET TWICE DAILY BEFORE MEALS 180 tablet 1   propranolol ER (INDERAL LA) 60 MG 24 hr capsule Take 1 capsule (60 mg total) by mouth daily. 90 capsule 3   rosuvastatin (CRESTOR) 20 MG tablet TAKE 1 TABLET EVERY DAY 90 tablet 1   Semaglutide,0.25 or 0.5MG/DOS, (OZEMPIC, 0.25 OR 0.5 MG/DOSE,) 2 MG/1.5ML SOPN Inject 0.25 mg weekly for 4 weeks then increase to 0.5 mg weekly 1.5 mL 2   telmisartan (MICARDIS) 80 MG tablet TAKE 1 TABLET EVERY DAY 90 tablet 1   trimethoprim (TRIMPEX) 100 MG tablet Take 1 tablet (100 mg total) by mouth daily. 30 tablet 11   TRUE METRIX BLOOD GLUCOSE TEST test strip USE AS INSTRUCTED TO CHECK BLOOD SUGARS TWICE DAILY. 200 strip 1   Vibegron (GEMTESA) 75 MG TABS Take 75 mg by mouth daily. 30 tablet 11   No current facility-administered medications for this visit.     Musculoskeletal: Strength & Muscle Tone: UTA Gait & Station: normal  Patient leans: N/A  Psychiatric Specialty Exam: Review of Systems  Psychiatric/Behavioral:  Positive for dysphoric mood and sleep disturbance.   All other systems reviewed and are negative.  There were no vitals taken for this visit.There is no height or weight on file to calculate BMI.  General Appearance: Casual  Eye Contact:  Fair  Speech:  Clear and Coherent  Volume:  Normal  Mood:  Depressed  Affect:  Tearful  Thought Process:  Goal Directed and Descriptions of Associations: Intact  Orientation:  Full (Time, Place, and Person)  Thought Content: Logical   Suicidal Thoughts:  No  Homicidal Thoughts:  No  Memory:   Immediate;   Fair Recent;   Fair Remote;   Fair  Judgement:  Fair  Insight:  Fair  Psychomotor Activity:  Normal  Concentration:  Concentration: Fair and Attention Span: Fair  Recall:  [employer] of Knowledge: Fair  Language: Fair  Akathisia:  No  Handed:  Right  AIMS (if indicated): not done  Assets:  Communication Skills Desire for Improvement Housing Social Support  ADL's:  Intact  Cognition: WNL  Sleep:  Poor   Screenings: GAD-7    Flowsheet Row Video Visit from 06/25/2021 in [place] Video Visit from 05/03/2021 in [facility] [place] Video Visit from 04/05/2021 in [place]  Total GAD-7 Score _0 PHQ2-9    Flowsheet Row Video Visit from 12/27/2021 in [facility] Visit from 10/04/2021 in [place] Management from 08/13/2021 in [facility] Video Visit from 06/27/2021 in [facility] Video Visit from 06/25/2021 in [place]  PHQ-2 Total Score 6 0 1 0 3  PHQ-9 Total Score 24 3 -- -- 3      Flowsheet Row ED to Hosp-Admission (Discharged) from 06/19/2021 in [facility] (1A) Admission (Discharged) from 06/12/2021 in [facility] (1A) Pre-Admission Testing 60 from 06/06/2021 in [place] TESTING  C-SSRS RISK CATEGORY No Risk No Risk No Risk        Assessment and Plan: BRYTTANY TORTORELLI is a 66 year old Caucasian female who has a history of MDD, anxiety disorder, sleep problems, multiple medical problems was evaluated by telemedicine today.  Patient is currently grieving the loss of her husband who passed away less than a month ago.  Patient will benefit from the following plan.  Plan MDD-unstable Lamotrigine 25 mg p.o. daily Patient currently reports  she is no longer on the Cymbalta. We will consider adding an SSRI or SNRI in the future.  Patient advised to bring all her medications to her next appointment in 2 weeks and we could do medication reconciliation and restart her on an antidepressant as needed at that visit. Patient is currently not interested in psychotherapy sessions.  Encouraged her to Biochemist, clinical know if she is interested.  Other specified anxiety disorder-unstable Gabapentin 300 mg manage nightly.  Insomnia-unstable Increase Lunesta to 2 mg p.o. nightly.  She has supplies and agrees to increase the dosage. Continue CPAP.  Bereavement-unstable Discussed referral for grief counseling.  Not interested at this time. We will monitor closely. Provided support.  Tobacco use disorder-unstable Patient is not ready to quit at this time.  Follow-up in clinic in 2 weeks or sooner in person.  This note was generated in part or whole with voice recognition software. Voice recognition is  usually quite accurate but there are transcription errors that can and very often do occur. I apologize for any typographical errors that were not detected and corrected.        Ursula Alert, MD 12/28/2021, 8:17 AM

## 2022-01-01 ENCOUNTER — Other Ambulatory Visit: Payer: Self-pay

## 2022-01-01 MED ORDER — GEMTESA 75 MG PO TABS: 75.0000 mg | ORAL_TABLET | Freq: Every day | ORAL | 11 refills | Status: DC

## 2022-01-01 NOTE — Telephone Encounter (Signed)
Spoke with pt. Since it is a new year we will try to get approval of gemtesa . Sent medication to [facility]. Pt aware that [facility] will call her. Pt verbalized understanding.

## 2022-01-01 NOTE — Telephone Encounter (Signed)
Patient doing ok considering. Not sleeping well. Misses her husband. Confirmed no suicidal thoughts. Saw Dr Shea Evans last week and sees again on 2/15. Declined appt with our office. Advised to call if she needs anything.

## 2022-01-03 ENCOUNTER — Telehealth: Payer: Self-pay | Admitting: Internal Medicine

## 2022-01-03 NOTE — Telephone Encounter (Signed)
Patient would like to speak to [place]. She fell in the shower on Sunday and thinks since her husband passed away she should get a life line button. Also she patient wants to change the company that she gets her CPAP supplies from. She is not happy with current company.

## 2022-01-04 NOTE — Telephone Encounter (Signed)
LMTCB to discuss.

## 2022-01-04 NOTE — Telephone Encounter (Signed)
Patient returning call, please call patient back @ [phone]

## 2022-01-07 ENCOUNTER — Encounter: Payer: Self-pay | Admitting: Internal Medicine

## 2022-01-07 NOTE — Telephone Encounter (Signed)
Spoke with patient. May need another sleep study. Pt said [facility] did not tell her that. Last sleep study was 2008. Will clarify with [facility] and then let patient know.

## 2022-01-07 NOTE — Telephone Encounter (Signed)
See my chart from patient.

## 2022-01-08 NOTE — Telephone Encounter (Signed)
Advised patient that I will call Dr Raul Del to set up sleep consult. Also will provide info to [facility] once appt is scheduled with Dr Raul Del. Confirmed with [facility] that new sleep study is needed.

## 2022-01-09 ENCOUNTER — Other Ambulatory Visit: Payer: Self-pay

## 2022-01-09 ENCOUNTER — Encounter: Payer: Self-pay | Admitting: Psychiatry

## 2022-01-09 ENCOUNTER — Ambulatory Visit (INDEPENDENT_AMBULATORY_CARE_PROVIDER_SITE_OTHER): Payer: Medicare HMO | Admitting: Psychiatry

## 2022-01-09 VITALS — BP 143/73 | HR 96 | Temp 97.7°F | Ht 63.75 in | Wt 200.0 lb

## 2022-01-09 DIAGNOSIS — G4701 Insomnia due to medical condition: Secondary | ICD-10-CM

## 2022-01-09 DIAGNOSIS — F418 Other specified anxiety disorders: Secondary | ICD-10-CM | POA: Diagnosis not present

## 2022-01-09 DIAGNOSIS — F331 Major depressive disorder, recurrent, moderate: Secondary | ICD-10-CM | POA: Diagnosis not present

## 2022-01-09 DIAGNOSIS — Z634 Disappearance and death of family member: Secondary | ICD-10-CM | POA: Diagnosis not present

## 2022-01-09 DIAGNOSIS — F17200 Nicotine dependence, unspecified, uncomplicated: Secondary | ICD-10-CM | POA: Diagnosis not present

## 2022-01-09 MED ORDER — GABAPENTIN 600 MG PO TABS: 600.0000 mg | ORAL_TABLET | Freq: Every day | ORAL | 0 refills | Status: DC

## 2022-01-09 NOTE — Patient Instructions (Signed)
Stop Lamictal.

## 2022-01-09 NOTE — Progress Notes (Signed)
[name] Progress Note  01/09/2022 4:26 PM Kathryn Friedman  MRN:  [id number]  Chief Complaint:  Chief Complaint  Patient presents with   Follow-up 66 year old Caucasian female with history of MDD, anxiety disorder, insomnia, recently grieving the loss of her husband, presented for medication management.   HPI: Kathryn Friedman is a 66 year old Caucasian female, widowed, retired, on [employer], lives in [place], has a history of MDD, insomnia, anxiety disorder, tobacco use disorder, multiple medical problems including obstructive sleep apnea on CPAP, eye surgery, coronary artery disease status post stent placement, COPD, diabetes mellitus, hypertension, GERD, interstitial lung disease, iron deficiency was evaluated in office today.  Patient continues to grieve the loss of her husband who passed away on December 06, 2022.  Patient reports she spends a lot of time crying.  Patient reports last night she prayed to God to take her so she could be with her husband.  Patient however currently denies any active suicidality.  Reports she will never do anything to harm herself.  She has a son who lives close by and she wants to live for him.  Patient reports she did speak to her son when she felt this way and he was able to support her.  Patient however continues to report she is not ready to talk to a counselor at this time about her grief.  She does have a friend that she talks to.  That helps to some extent.  Patient reports she feels restless and fidgety at night, likely restless leg symptoms.  This only happens at night.  She was started on gabapentin recently however that does not seem to help much at this dosage.  Denies side effects.  Agreeable to increasing the dosage.  Patient is compliant on her other medications.  Denies any side effects.  Denies any homicidality or perceptual disturbances.  Patient denies any other concerns today.  Visit Diagnosis:    ICD-10-CM   1. MDD (major  depressive disorder), recurrent episode, moderate (HCC)  F33.1 gabapentin (NEURONTIN) 600 MG tablet    2. Insomnia due to medical condition  G47.01 gabapentin (NEURONTIN) 600 MG tablet   Grief    3. Other specified anxiety disorders  F41.8 gabapentin (NEURONTIN) 600 MG tablet   Limited symptom attacks    4. Bereavement  Z63.4 gabapentin (NEURONTIN) 600 MG tablet    5. Tobacco use disorder  F17.200       Past Psychiatric History: Reviewed past psychiatric history from progress note on 06/26/2020.  Past trials of trazodone, Cymbalta, Lexapro, Rexulti, doxepin.  Patient was under the care of [facility] in the past.  Past Medical History:  Past Medical History:  Diagnosis Date   Anemia    Anginal pain (HCC)    Anxiety    Arthritis    back and knees   Asthma    Bilateral carotid artery stenosis    Blood in stool    Chronic diarrhea    COPD (chronic obstructive pulmonary disease) (HCC)    Coronary artery disease    a.) 75% pRCA; 3.5 x 28 mm Cypher DES placed on 07/24/2006   Current use of long term anticoagulation    Clopidogrel   Depression    secondary to the death of her husband (died 6)   Diverticulitis    Diverticulosis    Dizzinesses    Dysphagia    Dyspnea    Fatty infiltration of liver    GERD (gastroesophageal reflux disease)    Headache  History of 2019 novel coronavirus disease (COVID-19) 12/09/2020   History of 2019 novel coronavirus disease (COVID-19) 12/20/2020   Hypertension    Hypertriglyceridemia    ILD (interstitial lung disease) ([place])    Lump in the abdomen    OSA on CPAP    Overactive bladder    PSVT (paroxysmal supraventricular tachycardia) (HCC)    Spastic colon    T2DM (type 2 diabetes mellitus) ([place]) 05/2008   Tobacco abuse    Venous insufficiency of both lower extremities     Past Surgical History:  Procedure Laterality Date   ABDOMINAL HYSTERECTOMY  with left ovary in place [place]  Left 10/14/2017   calcs bx, fibrosis giant cell reaction and chronic inflammation, negative for malignancy.    CATARACT EXTRACTION, BILATERAL     CESAREAN SECTION  1984   CHOLECYSTECTOMY  1985   COLONOSCOPY WITH PROPOFOL N/A 09/13/2016   Procedure: COLONOSCOPY WITH PROPOFOL;  Surgeon: Manya Silvas, MD;  Location: [facility] ENDOSCOPY;  Service: Endoscopy;  Laterality: N/A;   COLONOSCOPY WITH PROPOFOL N/A 11/09/2018   Procedure: COLONOSCOPY WITH PROPOFOL;  Surgeon: Manya Silvas, MD;  Location: [facility] ENDOSCOPY;  Service: Endoscopy;  Laterality: N/A;   COLONOSCOPY WITH PROPOFOL N/A 03/29/2020   Procedure: COLONOSCOPY WITH PROPOFOL;  Surgeon: Robert Bellow, MD;  Location: [facility] ENDOSCOPY;  Service: Endoscopy;  Laterality: N/A;   CORONARY ANGIOPLASTY WITH STENT PLACEMENT N/A 07/24/2006   75% pRCA; 3.5 x 28 mm Cypher DES placed; Location: [facility]; Surgeons: Katrine Coho, MD   ESOPHAGOGASTRODUODENOSCOPY (EGD) WITH PROPOFOL N/A 02/02/2018   Procedure: ESOPHAGOGASTRODUODENOSCOPY (EGD) WITH PROPOFOL;  Surgeon: Manya Silvas, MD;  Location: [facility] ENDOSCOPY;  Service: Endoscopy;  Laterality: N/A;   ESOPHAGOGASTRODUODENOSCOPY (EGD) WITH PROPOFOL N/A 03/29/2020   Procedure: ESOPHAGOGASTRODUODENOSCOPY (EGD) WITH PROPOFOL;  Surgeon: Robert Bellow, MD;  Location: [facility] ENDOSCOPY;  Service: Endoscopy;  Laterality: N/A;   EYE SURGERY     JOINT REPLACEMENT     bilateral knee replacements   KNEE ARTHROSCOPY  Arthroscopic left knee surgery    KNEE SURGERY  status post knee surgey    LEFT HEART CATH AND CORONARY ANGIOGRAPHY Left 05/14/2018   Procedure: LEFT HEART CATH AND CORONARY ANGIOGRAPHY;  Surgeon: Corey Skains, MD;  Location: [facility] CV LAB;  Service: Cardiovascular;  Laterality: Left;   REPLACEMENT TOTAL KNEE  ([facility])   SHOULDER SURGERY  shoulder operation secondary to a torn tendon   TOTAL HIP ARTHROPLASTY Left 06/12/2021   Procedure: TOTAL HIP ARTHROPLASTY;  Surgeon: Corky Mull,  MD;  Location: [facility] ORS;  Service: Orthopedics;  Laterality: Left;    Family Psychiatric History: Reviewed family psychiatric history from progress note on 06/26/2020.  Family History:  Family History  Problem Relation Age of Onset   Other Mother        Hit by a fire truck and has had multiple operations on her back , and has history of MVP    Mitral valve prolapse Mother    Lung cancer Mother    Depression Mother    Heart disease Father        myocardial infarction and is status post bypass surgery   Mitral valve prolapse Sister    Bipolar disorder Sister    Hepatitis C Brother    Cirrhosis Brother    Colon cancer Paternal Aunt    Breast cancer Neg Hx    Prostate cancer Neg Hx    Bladder Cancer Neg Hx  Kidney cancer Neg Hx     Social History: Reviewed social history from progress note on 06/26/2020. Social History   Socioeconomic History   Marital status: Widowed    Spouse name: Hephzibah Strehle   Number of children: 1   Years of education: 12   Highest education level: 12th grade  Occupational History    Employer: [employer]  Tobacco Use   Smoking status: Every Day    Packs/day: 2.00    Years: 45.00    Pack years: 90.00    Types: Cigarettes    Passive exposure: Current   Smokeless tobacco: Never   Tobacco comments:    Patient reported she is not ready to quit at this time due to recent loss of her husband.   Vaping Use   Vaping Use: Former  Substance and Sexual Activity   Alcohol use: No    Alcohol/week: 0.0 standard drinks   Drug use: No   Sexual activity: Not Currently  Other Topics Concern   Not on file  Social History Narrative   Lives with husband   Social Determinants of Health   Financial Resource Strain: Medium Risk   Difficulty of Paying Living Expenses: Somewhat hard  Food Insecurity: No Food Insecurity   Worried About Charity fundraiser in the Last Year: Never true   Ran Out of Food in the Last Year: Never true  Transportation Needs: No Transportation  Needs   Lack of Transportation (Medical): No   Lack of Transportation (Non-Medical): No  Physical Activity: Inactive   Days of Exercise per Week: 0 days   Minutes of Exercise per Session: 0 min  Stress: No Stress Concern Present   Feeling of Stress : Only a little  Social Connections: Moderately Integrated   Frequency of Communication with Friends and Family: More than three times a week   Frequency of Social Gatherings with Friends and Family: More than three times a week   Attends Religious Services: 1 to 4 times per year   Active Member of [employer] or Organizations: No   Attends Archivist Meetings: Never   Marital Status: Married    Allergies:  Allergies  Allergen Reactions   Varenicline Other (See Comments)    "I got really depressed" "I got really depressed" CHANTEX   Jardiance [Empagliflozin] Other (See Comments)    Yeast infection   Metformin And Related Other (See Comments)    Diarrhea, even with XR   Methylprednisolone Nausea Only and Nausea And Vomiting    Metabolic Disorder Labs: Lab Results  Component Value Date   HGBA1C 7.2 (H) 10/22/2021   No results found for: PROLACTIN Lab Results  Component Value Date   CHOL 120 10/22/2021   TRIG 128.0 10/22/2021   HDL 43.40 10/22/2021   CHOLHDL 3 10/22/2021   VLDL 25.6 10/22/2021   LDLCALC 51 10/22/2021   LDLCALC 41 07/06/2021   Lab Results  Component Value Date   TSH 0.87 07/06/2021   TSH 1.56 03/17/2020    Therapeutic Level Labs: No results found for: LITHIUM No results found for: VALPROATE No components found for:  CBMZ  Current Medications: Current Outpatient Medications  Medication Sig Dispense Refill   acetaminophen (TYLENOL) 325 MG tablet Take 650 mg by mouth every 6 (six) hours as needed.     albuterol (VENTOLIN HFA) 108 (90 Base) MCG/ACT inhaler INHALE 2 PUFFS FOUR TIMES A DAY 8.5 g 11   amLODipine (NORVASC) 5 MG tablet TAKE 1 TABLET EVERY DAY 90 tablet 1  apixaban (ELIQUIS) 5 MG TABS  tablet Take 1 tablet (5 mg total) by mouth 2 (two) times daily. 60 tablet 2   Blood Glucose Monitoring Suppl (TRUE METRIX METER) w/Device KIT      Budeson-Glycopyrrol-Formoterol (BREZTRI AEROSPHERE) 160-9-4.8 MCG/ACT AERO Inhale 2 puffs into the lungs in the morning and at bedtime. 32.1 g 3   CALCIUM PO Take 600 mg by mouth daily.      DULoxetine (CYMBALTA) 60 MG capsule Take 1 capsule (60 mg total) by mouth daily. 90 capsule 1   eszopiclone (LUNESTA) 1 MG TABS tablet Take 2 tablets (2 mg total) by mouth at bedtime as needed for sleep. Take immediately before bedtime 90 tablet 1   famotidine (PEPCID) 40 MG tablet TAKE 1 TABLET (40 MG TOTAL) BY MOUTH DAILY. 90 tablet 1   gabapentin (NEURONTIN) 600 MG tablet Take 1 tablet (600 mg total) by mouth at bedtime. 90 tablet 0   isosorbide mononitrate (IMDUR) 30 MG 24 hr tablet TAKE 1 TABLET EVERY DAY 90 tablet 1   Multiple Vitamin (MULTIVITAMIN PO) Take 1 tablet by mouth daily.     nystatin (MYCOSTATIN/NYSTOP) powder Apply topically 2 (two) times daily. to affected area(s) 30 g 0   oxyCODONE (OXY IR/ROXICODONE) 5 MG immediate release tablet Take 1-2 tablets (5-10 mg total) by mouth every 4 (four) hours as needed for moderate pain (pain score 4-6). 20 tablet 0   oxyCODONE (OXY IR/ROXICODONE) 5 MG immediate release tablet Take by mouth.     pantoprazole (PROTONIX) 40 MG tablet TAKE 1 TABLET TWICE DAILY BEFORE MEALS 180 tablet 1   propranolol ER (INDERAL LA) 60 MG 24 hr capsule Take 1 capsule (60 mg total) by mouth daily. 90 capsule 3   rosuvastatin (CRESTOR) 20 MG tablet TAKE 1 TABLET EVERY DAY 90 tablet 1   Semaglutide,0.25 or 0.5MG/DOS, (OZEMPIC, 0.25 OR 0.5 MG/DOSE,) 2 MG/1.5ML SOPN Inject 0.25 mg weekly for 4 weeks then increase to 0.5 mg weekly 1.5 mL 2   telmisartan (MICARDIS) 80 MG tablet TAKE 1 TABLET EVERY DAY 90 tablet 1   trimethoprim (TRIMPEX) 100 MG tablet Take 1 tablet (100 mg total) by mouth daily. 30 tablet 11   TRUE METRIX BLOOD GLUCOSE TEST  test strip USE AS INSTRUCTED TO CHECK BLOOD SUGARS TWICE DAILY. 200 strip 1   Vibegron (GEMTESA) 75 MG TABS Take 75 mg by mouth daily. 30 tablet 11   dicyclomine (BENTYL) 20 MG tablet Take 20 mg by mouth every 6 (six) hours as needed. (Patient not taking: Reported on 01/09/2022)     No current facility-administered medications for this visit.     Musculoskeletal: Strength & Muscle Tone: within normal limits Gait & Station:  walks with cane Patient leans: N/A  Psychiatric Specialty Exam: Review of Systems  Psychiatric/Behavioral:  Positive for dysphoric mood and sleep disturbance. The patient is nervous/anxious.   All other systems reviewed and are negative.  Blood pressure (!) 143/73, pulse 96, temperature 97.7 F (36.5 C), height 5' 3.75" (1.619 m), weight 200 lb (90.7 kg), SpO2 96 %.Body mass index is 34.6 kg/m.  General Appearance: Casual  Eye Contact:  Fair  Speech:  Clear and Coherent  Volume:  Normal  Mood:  Anxious and Depressed  Affect:  Tearful  Thought Process:  Goal Directed and Descriptions of Associations: Intact  Orientation:  Full (Time, Place, and Person)  Thought Content: Logical   Suicidal Thoughts:  No  Homicidal Thoughts:  No  Memory:  Immediate;   Fair Recent;  Fair Remote;   Fair  Judgement:  Fair  Insight:  Fair  Psychomotor Activity:  Normal  Concentration:  Concentration: Fair and Attention Span: Fair  Recall:  [employer] of Knowledge: Fair  Language: Fair  Akathisia:  No  Handed:  Right  AIMS (if indicated): not done  Assets:  Communication Skills Desire for Improvement Housing Social Support  ADL's:  Intact  Cognition: WNL  Sleep:  Poor   Screenings: GAD-7    Flowsheet Row Video Visit from 06/25/2021 in [facility] Video Visit from 05/03/2021 in [facility] Video Visit from 04/05/2021 in [facility]  Total GAD-7 Score _0 PHQ2-9    [place] Visit from 01/09/2022 in [facility] Video Visit from 12/27/2021 in [facility] Visit from 10/04/2021 in [facility] from 08/13/2021 in [facility] Video Visit from 06/27/2021 in [facility]  PHQ-2 Total Score 6 6 0 1 0  PHQ-9 Total Score _1 -- --      [place] Office Visit from 01/09/2022 in [facility] to Hosp-Admission (Discharged) from 06/19/2021 in [facility] (1A) Admission (Discharged) from 06/12/2021 in [facility] (1A)  C-SSRS RISK CATEGORY Low Risk No Risk No Risk        Assessment and Plan: RHIANNAN KIEVIT is a 66 year old Caucasian female who has a history of MDD, anxiety disorder, sleep problems, multiple medical problems was evaluated in office today.  Patient continues to grieve the loss of her husband, also reports restlessness at night.  Will benefit from the following plan.  Plan MDD-unstable Discontinue Lamictal. Continue Cymbalta 60 mg p.o. daily Increase gabapentin to 600 mg p.o. nightly.  Other specified anxiety disorder-improving Gabapentin as prescribed Discussed with for CBT-patient declined  Insomnia-unstable Lunesta 2 mg p.o. nightly Increase gabapentin to 600 mg p.o. nightly for possible restless leg symptoms Continue CPAP  Bereavement-unstable Provided grief counseling. Discussed referral to grief support group.  Provided community resources. Patient is not ready at this time.  Tobacco use disorder-unstable Patient is not ready to quit at this time.   Collaboration of Care: Collaboration of Care: Patient refused AEB refused to referral for grief counseling.  Provided resources.  Patient/Guardian was advised Release of Information must be obtained prior to any record release in order  to collaborate their care with an outside provider. Patient/Guardian was advised if they have not already done so to contact the registration department to sign all necessary forms in order for [place] to release information regarding their care.   Consent: Patient/Guardian gives verbal consent for treatment and assignment of benefits for services provided during this visit. Patient/Guardian expressed understanding and agreed to proceed.   Follow-up in clinic in 3 weeks or sooner if needed.  This note was generated in part or whole with voice recognition software. Voice recognition is usually quite accurate but there are transcription errors that can and very often do occur. I apologize for any typographical errors that were not detected and corrected.      Ursula Alert, MD 01/10/2022, 8:26 AM

## 2022-01-10 NOTE — Telephone Encounter (Signed)
Appt with Dr Raul Del scheduled for 01/14/22 at 11:45 regarding sleep consult. Spoke with med alert at [facility] regarding med alert button- she needs to call them directly [phone]. Should cost around $28/month. LM to give patient this information

## 2022-01-10 NOTE — Telephone Encounter (Signed)
See note.  You asked me to forward back to you

## 2022-01-11 ENCOUNTER — Ambulatory Visit (INDEPENDENT_AMBULATORY_CARE_PROVIDER_SITE_OTHER): Payer: Medicare HMO | Admitting: Pharmacist

## 2022-01-11 DIAGNOSIS — J449 Chronic obstructive pulmonary disease, unspecified: Secondary | ICD-10-CM

## 2022-01-11 DIAGNOSIS — I480 Paroxysmal atrial fibrillation: Secondary | ICD-10-CM

## 2022-01-11 DIAGNOSIS — I10 Essential (primary) hypertension: Secondary | ICD-10-CM

## 2022-01-11 DIAGNOSIS — I2510 Atherosclerotic heart disease of native coronary artery without angina pectoris: Secondary | ICD-10-CM

## 2022-01-11 DIAGNOSIS — E1159 Type 2 diabetes mellitus with other circulatory complications: Secondary | ICD-10-CM

## 2022-01-11 MED ORDER — ALBUTEROL SULFATE HFA 108 (90 BASE) MCG/ACT IN AERS: INHALATION_SPRAY | RESPIRATORY_TRACT | 11 refills | Status: DC

## 2022-01-11 NOTE — Chronic Care Management (AMB) (Signed)
Chronic Care Management CCM Pharmacy Note  01/11/2022 Name:  Kathryn Friedman MRN:  [id number] DOB:  25-Jan-1956  Summary: - Improving glycemic control with Ozempic [place] in the shower ~1.5 weeks ago. - Grieving loss of husband - BP elevated  Recommendations/Changes made from today's visit: - Advised to schedule follow up with Dr. Donivan Scull office for BP control - Provided information on medical alert systems  Subjective: Kathryn Friedman is an 66 y.o. year old female who is a primary patient of Einar Pheasant, MD.  The CCM team was consulted for assistance with disease management and care coordination needs.    Engaged with patient by telephone for follow up visit for pharmacy case management and/or care coordination services.   Objective:  Medications Reviewed Today     Reviewed by De Hollingshead, RPH-CPP (Pharmacist) on 01/11/22 at [place] List Status: <None>   Medication Order Taking? Sig Documenting Provider Last Dose Status Informant  acetaminophen (TYLENOL) 325 MG tablet [id number] Yes Take 650 mg by mouth every 6 (six) hours as needed. [provider] Taking Active Self  albuterol (VENTOLIN HFA) 108 (90 Base) MCG/ACT inhaler [id number] No INHALE 2 PUFFS FOUR TIMES A DAY  Patient not taking: Reported on 01/11/2022   Einar Pheasant, MD Not Taking Active            Med Note Mayo Ao Aug 06, 2021 11:24 AM)    amLODipine (NORVASC) 5 MG tablet [id number] Yes TAKE 1 TABLET EVERY DAY Einar Pheasant, MD Taking Active   apixaban (ELIQUIS) 5 MG TABS tablet [id number] Yes Take 1 tablet (5 mg total) by mouth 2 (two) times daily. Einar Pheasant, MD Taking Active   Blood Glucose Monitoring Suppl (TRUE METRIX METER) w/Device Drucie Opitz [id number] Yes  [provider] Taking Active Self  Budeson-Glycopyrrol-Formoterol (BREZTRI AEROSPHERE) 160-9-4.8 MCG/ACT AERO [id number] Yes Inhale 2 puffs into the lungs in the morning and at bedtime.  Einar Pheasant, MD Taking Active   CALCIUM PO [id number] Yes Take 600 mg by mouth daily.  [provider] Taking Active Self  cholecalciferol (VITAMIN D3) 25 MCG (1000 UNIT) tablet [id number] Yes Take 1,000 Units by mouth daily. [provider] Taking Active   Cyanocobalamin (VITAMIN B-12 PO) [id number] Yes Take 2,500 mcg by mouth daily. [provider] Taking Active   DULoxetine (CYMBALTA) 60 MG capsule [id number] Yes Take 1 capsule (60 mg total) by mouth daily. Einar Pheasant, MD Taking Active Self  eszopiclone Johnnye Sima) 1 MG TABS tablet [id number] Yes Take 2 tablets (2 mg total) by mouth at bedtime as needed for sleep. Take immediately before bedtime Ursula Alert, MD Taking Active   famotidine (PEPCID) 40 MG tablet [id number] Yes TAKE 1 TABLET (40 MG TOTAL) BY MOUTH DAILY. Einar Pheasant, MD Taking Active   gabapentin (NEURONTIN) 600 MG tablet [id number] Yes Take 1 tablet (600 mg total) by mouth at bedtime. Ursula Alert, MD Taking Active   isosorbide mononitrate (IMDUR) 30 MG 24 hr tablet [id number] Yes TAKE 1 TABLET EVERY DAY Einar Pheasant, MD Taking Active   Multiple Vitamin (MULTIVITAMIN) tablet [id number] Yes Take 1 tablet by mouth daily. [provider] Taking Active   Multiple Vitamins-Minerals (HAIR SKIN AND NAILS FORMULA PO) [id number] Yes Take 2 tablets by mouth daily. [provider] Taking Active   nystatin (MYCOSTATIN/NYSTOP) powder [id number]  Apply topically 2 (two) times daily. to affected area(s) Einar Pheasant, MD  Active   oxyCODONE (OXY IR/ROXICODONE) 5 MG immediate release tablet [id number]  Yes Take 1-2 tablets (5-10 mg total) by mouth every 4 (four) hours as needed for moderate pain (pain score 4-6). Sidney Ace, MD Taking Active            Med Note Darnelle Maffucci, Arville Lime   Fri Jan 11, 2022  9:13 AM) 0-2 daily  pantoprazole (PROTONIX) 40 MG tablet [id number] Yes TAKE 1 TABLET TWICE DAILY BEFORE MEALS Einar Pheasant, MD Taking  Active   propranolol ER (INDERAL LA) 60 MG 24 hr capsule [id number] Yes Take 1 capsule (60 mg total) by mouth daily. Minna Merritts, MD Taking Active   rosuvastatin (CRESTOR) 20 MG tablet [id number] Yes TAKE 1 TABLET EVERY DAY Einar Pheasant, MD Taking Active   Semaglutide,0.25 or 0.5MG/DOS, (OZEMPIC, 0.25 OR 0.5 MG/DOSE,) 2 MG/1.5ML SOPN [id number] Yes Inject 0.25 mg weekly for 4 weeks then increase to 0.5 mg weekly Einar Pheasant, MD Taking Active            Med Note De Hollingshead   Fri Jan 11, 2022  9:12 AM) 0.5 mg weekly  telmisartan (MICARDIS) 80 MG tablet [id number] Yes TAKE 1 TABLET EVERY DAY Einar Pheasant, MD Taking Active   trimethoprim (TRIMPEX) 100 MG tablet [id number] Yes Take 1 tablet (100 mg total) by mouth daily. Bjorn Loser, MD Taking Active   TRUE METRIX BLOOD GLUCOSE TEST test strip [id number] Yes USE AS INSTRUCTED TO CHECK BLOOD SUGARS TWICE DAILY. Einar Pheasant, MD Taking Active   Vibegron [facility]) 75 MG TABS [id number] Yes Take 75 mg by mouth daily. Bjorn Loser, MD Taking Active            Med Note De Hollingshead   Fri Jan 11, 2022  9:09 AM) Taking every other day            Pertinent Labs:   Lab Results  Component Value Date   HGBA1C 7.2 (H) 10/22/2021   Lab Results  Component Value Date   CHOL 120 10/22/2021   HDL 43.40 10/22/2021   LDLCALC 51 10/22/2021   LDLDIRECT 103.0 03/30/2021   TRIG 128.0 10/22/2021   CHOLHDL 3 10/22/2021   Lab Results  Component Value Date   CREATININE 0.63 10/22/2021   BUN 5 (L) 10/22/2021   NA 137 10/22/2021   K 3.8 10/22/2021   CL 100 10/22/2021   CO2 26 10/22/2021    SDOH:  (Social Determinants of Health) assessments and interventions performed:  SDOH Interventions    Flowsheet Row Most Recent Value  SDOH Interventions   Financial Strain Interventions Other (Comment)  [manufacturer assistance]       CCM Care Plan  Review of patient past medical history, allergies, medications, health  status, including review of consultants reports, laboratory and other test data, was performed as part of comprehensive evaluation and provision of chronic care management services.   Care Plan : General Pharmacy (Adult)  Updates made by De Hollingshead, RPH-CPP since 01/11/2022 12:00 AM     Problem: COPD, CAD, HLD, HTN      Long-Range Goal: Disease Progression Prevention   Recent Progress: On track  Priority: High  Note:   Current Barriers:  Unable to independently afford treatment regimen Complex patient with multiple comorbidities including diabetes, coronary artery disease, COPD, depression/anxiety  Pharmacist Clinical Goal(s):  Over the next 90 days, patient will verbalize ability to afford treatment regimen. Over the next 90 days, patient will adhere to prescribed medication regimen  Interventions: 1:1 collaboration with Einar Pheasant, MD regarding development and update of  comprehensive plan of care as evidenced by provider attestation and co-signature Inter-disciplinary care team collaboration (see longitudinal plan of care) Comprehensive medication review performed; medication list updated in electronic medical record  Health Maintenance Yearly diabetic eye exam: up to date Yearly diabetic foot exam: due - reports she had completed last year, but we have not received records. Encouraged patient to ask to have sent.  Urine microalbumin: up to date Yearly influenza vaccination: up to date  Td/Tdap vaccination: up to date Pneumonia vaccination: due - recommend with next PCP visit COVID vaccinations: due for bivalent booster- recommended to pursue Shingrix vaccinations: due-recommended to pursue Colonoscopy: up to date Bone density scan: due - scheduled  Diabetes: Uncontrolled; current treatment: Ozempic 0.5 mg weekly x1 week Notes some constipation, but otherwise denies GI upset. Does note some decreased appetite, but denies.  Stopped metformin XR, resolution in  diarrhea  Hx Jardiance - yeast infections Current glucose readings: fastings: 110-140 2 hour post prandial: 150-190 Current meal patterns: bagel w/ cream cheese, cereal - cinnamon toast; supper: baked potato Advised to continue Ozempic 0.5 mg weekly. Follow up with PCP as scheduled.  Discussed increasing hydration, fibers to mitigate constipation.  Extensive dietary discussion. Encouraged reduced carbohydrate portion sizes, focus on lean proteins, vegetables and fruits.   Hypertension, Coronary Artery Disease, new diagnosis of AFib Appropriately managed; current treatment:  Rate control: propranolol 40 mg daily  Anticoagulation: Eliquis 5 mg BID Additional antihypertensives/antianginals: amlodipine 5 mg daily, isosorbide 30 mg daily, telmisartan 80 mg daily Home BP readings: 140-150/90-100s. Denies headaches, symptoms of hypertension.  Unable to apply for Eliquis 2023 assistance until patient meets 3% of household income in prescription out of pocket spend  Advised to call Dr. Donivan Scull office to schedule follow up. Recommended to continue current regimen at this time.   Hyperlipidemia, ASCVD Risk Reduction Controlled per last lipid panel; current treatment: atorvastatin 20 mg daily Antiplatelet therapy: none given concurrent anticoagulation Recommended to continue current regimen at this time  Chronic Obstructive Pulmonary Disease: Controlled; current treatment: Breztri 160/9/4.8 mcg 2 puffs BID; albuterol HFA PRN - 1-2 times daily due to wheezing and SOB; follows w/ Dr. Raul Del Receiving [place] through [place] patient assistance through 2023.  Previously recommended to continue current regimen at this time along with pulmonology collaboration  Depression/Anxiety/Insomnia/Pain: Exacerbated by husband's recent passing; follows w/ Dr. Shea Evans. Current regimen: duloxetine 60 mg daily; gabapentin 600 mg QPM, eszopiclone 2 mg QPM Discussed recent increase of gabapentin and eszopiclone. Advised to  monitor for increased next day sedation. Patient notes she did not tell Dr. Shea Evans about her recent fall.  Recommended to continue current regimen at this time along with psychiatry follow up Declines therapy at this time, but notes she will let us know if she changes her mind.   Pain, s/p recent hip replacement with subsequent fracture Uncontrolled; current treatment: acetaminophen 500 mg BID PRN,  oxycodone 5 mg 1-2 tablets Q4H s/p hip replacement, using 1-2 doses daily  Reports improving pain since her fall.  Previously recommended to continue current regimen at this time  Diarrhea, chronic with GERD: Moderately well controlled per patient report; Current regimen: pantoprazole 40 mg BID, famotidine 40 mg daily Reports no diarrhea since starting Ozempic.  Recommended to continue current regimen at this time.   Overactive Bladder: Managed per patient report; current regimen: Gemtesa 75 mg daily - per Dr. Bernardo Heater, taking every other day Previously on tolterodine, beneficial at first but then waned over time Myrbetriq too expensive on her insurance Oxybutynin  did not have lasting benefit Unfortunately, no patient assistance programs for Gemtesa at this time. Previously recommended to continue to collaborate with Dr. Dene Gentry office for access needs.   Tobacco Abuse: 1-1.5 packs per day; 50 years of use Previous quit attempts: unsuccessful using varenicline (nightmares) Triggers to smoke: stress, familial stress lately as well as her own health concerns.  Previously encouraged to start nicotine patches + gum when ready to quit.   Patient Goals/Self-Care Activities Over the next 90 days, patient will:  - take medications as prescribed collaborate with provider on medication access solutions       Plan: Telephone follow up appointment with care management team member scheduled for:  10 weeks  [name], PharmD, [place], [place] Pharmacist [employer] at [employer] [phone]

## 2022-01-11 NOTE — Telephone Encounter (Signed)
Patient declined moving her 3/14 appt up. She sees Dr Raul Del on Monday and then you on 3/14 and also made appt with Dr Rockey Situ on 3/17. Advised that Dr Nicki Reaper recommended being seen sooner patient declined.

## 2022-01-11 NOTE — Telephone Encounter (Signed)
See Kathryn Friedman's note

## 2022-01-11 NOTE — Telephone Encounter (Signed)
Spoke with patient, provided information as noted by [place] in [place] encounter. Patient verbalized understanding.   We discussed her fall (occurred 12/30/21). She reports she is unsure if she slipped or got dizzy. She reports she has been more "unsteady" on her feet lately. She does endorse that she hit her head, hit her hip, hit her rear. Did not seek evaluation. Denies headaches, dizziness, vision changes, nausea/vomiting. Reports she has put a shower chair in her shower since then. Notes her hip is still a little sore, but overall improving.   Denies needing acute evaluation from Dr. Nicki Reaper.   BP has been relatively elevated- 140/150/90s. Advised to call Dr. Donivan Scull office for follow up. Reports she will do so today. Advised on risk of sedation due to recent dose increases of Lunesta, gabapentin. Encouraged to notify Dr. Shea Evans if worsened next day sedation, dizziness.   Routing to PCP

## 2022-01-11 NOTE — Telephone Encounter (Signed)
Reviewed.  With her issues, recent fall, etc - needs to be evaluated.  Please schedule appt with me.

## 2022-01-11 NOTE — Telephone Encounter (Signed)
Noted  

## 2022-01-11 NOTE — Patient Instructions (Signed)
Kathryn Friedman,   It was great talking to you today!  Continue Ozempic 0.5 mg weekly. Focus on lean proteins (chicken, fish, [place] yogurt, eggs), vegetables and fruits, and whole grains that increase fiber intake. Focus on hydration with water.   Schedule follow up with Dr. Rockey Situ.   We recommend the Shingrix (shingles) vaccine series for all over age 66. It should have a $0 copay on all Medicare plans this year. You can pursue this without a prescription at your local pharmacy, or feel free to call our [place] at [facility] at [phone].  We recommend you get the updated bivalent COVID-19 booster, at least 2 months after any prior doses. You may consider delaying a booster dose by 3 months from a prior episode of COVID-19 per the CDC.  Please ask Dr. Waynetta Sandy office to send [place] the results of your most recent diabetic eye exam.  Take care, and let us know if you need anything.   Kathryn Friedman, PharmD  Visit Information  Following are the goals we discussed today:  Patient Goals/Self-Care Activities Over the next 90 days, patient will:  - take medications as prescribed collaborate with provider on medication access solutions          Plan: Telephone follow up appointment with care management team member scheduled for:  10 weeks   Kathryn Friedman, PharmD, [place], CPP Clinical Pharmacist [place] at [facility] [phone]         Please call the care guide team at [phone] if you need to cancel or reschedule your appointment.   Patient verbalizes understanding of instructions and care plan provided today and agrees to view in [place]. Active MyChart status confirmed with patient.

## 2022-01-13 ENCOUNTER — Encounter: Payer: Self-pay | Admitting: Internal Medicine

## 2022-01-14 DIAGNOSIS — J449 Chronic obstructive pulmonary disease, unspecified: Secondary | ICD-10-CM | POA: Diagnosis not present

## 2022-01-14 DIAGNOSIS — R0609 Other forms of dyspnea: Secondary | ICD-10-CM | POA: Diagnosis not present

## 2022-01-14 DIAGNOSIS — G4733 Obstructive sleep apnea (adult) (pediatric): Secondary | ICD-10-CM | POA: Diagnosis not present

## 2022-01-14 NOTE — Telephone Encounter (Signed)
Tried to reach patient by phone no answer sent my chart asking her to call office in AM ask for [place] or Juliann Pulse. Dr. Nicki Reaper says we can schedule in office tomorrow at 4,01/15/22.

## 2022-01-14 NOTE — Telephone Encounter (Signed)
Called patient and was unable to leave VM due to mailbox not being set up

## 2022-01-14 NOTE — Telephone Encounter (Signed)
Tried to reach patient by phone no answer and no voicemail. 

## 2022-01-14 NOTE — Telephone Encounter (Signed)
Please call pt.  She needs an appt with me.

## 2022-01-15 ENCOUNTER — Ambulatory Visit (INDEPENDENT_AMBULATORY_CARE_PROVIDER_SITE_OTHER): Payer: Medicare HMO | Admitting: Internal Medicine

## 2022-01-15 DIAGNOSIS — F331 Major depressive disorder, recurrent, moderate: Secondary | ICD-10-CM | POA: Diagnosis not present

## 2022-01-15 DIAGNOSIS — I2510 Atherosclerotic heart disease of native coronary artery without angina pectoris: Secondary | ICD-10-CM

## 2022-01-15 DIAGNOSIS — I10 Essential (primary) hypertension: Secondary | ICD-10-CM

## 2022-01-15 DIAGNOSIS — I480 Paroxysmal atrial fibrillation: Secondary | ICD-10-CM

## 2022-01-15 DIAGNOSIS — G4733 Obstructive sleep apnea (adult) (pediatric): Secondary | ICD-10-CM

## 2022-01-15 DIAGNOSIS — I700 Atherosclerosis of aorta: Secondary | ICD-10-CM | POA: Diagnosis not present

## 2022-01-15 DIAGNOSIS — I779 Disorder of arteries and arterioles, unspecified: Secondary | ICD-10-CM | POA: Diagnosis not present

## 2022-01-15 NOTE — Telephone Encounter (Signed)
Patient scheduled for telephone visit this afternoon. 

## 2022-01-15 NOTE — Progress Notes (Signed)
Patient ID: Kathryn Friedman, female   DOB: 1956/01/04, 66 y.o.   MRN: [id number]   Virtual Visit via telephone Note  This visit type was conducted due to national recommendations for restrictions regarding the COVID-19 pandemic (e.g. social distancing).  This format is felt to be most appropriate for this patient at this time.  All issues noted in this document were discussed and addressed.  No physical exam was performed (except for noted visual exam findings with Video Visits).   I connected with Kathryn Friedman by telephone and verified that I am speaking with the correct person using two identifiers. Location patient: home Location provider: work  Persons participating in the telephone visit: patient, provider  The limitations, risks, security and privacy concerns of performing an evaluation and management service by telephone and the availability of in person appointments have been discussed.  It has also been discussed with the patient that there may be a patient responsible charge related to this service. The patient expressed understanding and agreed to proceed.   Reason for visit: work in appt  HPI: Work in to discuss increased stress and depression and also her medications.  Second husband passed.  Increased stress and depression related to this.  Discussed.  Seeing Dr Shea Evans.  Taking cymbalta.  Also on lunesta.  Sees Dr Shea Evans 2x/month.  Discussed suicidal ideations.  Denies at this time.  States she could not do that to her son.  Has discussed with Dr Shea Evans as well.  She reports she fell two weeks ago.  Hit her head and hip.  Cut her hand.  Was not evaluated.  Declines in office evaluation now.  Using a cane to ambulate.  Breathing stable.  No chest pain reported.  Discussed the need to stay on eliquis.  Working with our pharmacist.  Discussed samples.     ROS: See pertinent positives and negatives per HPI.  Past Medical History:  Diagnosis Date   Anemia     Anginal pain (HCC)    Anxiety    Arthritis    back and knees   Asthma    Bilateral carotid artery stenosis    Blood in stool    Chronic diarrhea    COPD (chronic obstructive pulmonary disease) (HCC)    Coronary artery disease    a.) 75% pRCA; 3.5 x 28 mm Cypher DES placed on 07/24/2006   Current use of long term anticoagulation    Clopidogrel   Depression    secondary to the death of her husband (died 51)   Diverticulitis    Diverticulosis    Dizzinesses    Dysphagia    Dyspnea    Fatty infiltration of liver    GERD (gastroesophageal reflux disease)    Headache    History of 2019 novel coronavirus disease (COVID-19) 12/09/2020   History of 2019 novel coronavirus disease (COVID-19) 12/20/2020   Hypertension    Hypertriglyceridemia    ILD (interstitial lung disease) ([place])    Lump in the abdomen    OSA on CPAP    Overactive bladder    PSVT (paroxysmal supraventricular tachycardia) (HCC)    Spastic colon    T2DM (type 2 diabetes mellitus) ([place]) 05/2008   Tobacco abuse    Venous insufficiency of both lower extremities     Past Surgical History:  Procedure Laterality Date   ABDOMINAL HYSTERECTOMY  with left ovary in place [place] Left 10/14/2017   calcs bx, fibrosis  giant cell reaction and chronic inflammation, negative for malignancy.    CATARACT EXTRACTION, BILATERAL     CESAREAN SECTION  1984   CHOLECYSTECTOMY  1985   COLONOSCOPY WITH PROPOFOL N/A 09/13/2016   Procedure: COLONOSCOPY WITH PROPOFOL;  Surgeon: Manya Silvas, MD;  Location: [facility] ENDOSCOPY;  Service: Endoscopy;  Laterality: N/A;   COLONOSCOPY WITH PROPOFOL N/A 11/09/2018   Procedure: COLONOSCOPY WITH PROPOFOL;  Surgeon: Manya Silvas, MD;  Location: [facility] ENDOSCOPY;  Service: Endoscopy;  Laterality: N/A;   COLONOSCOPY WITH PROPOFOL N/A 03/29/2020   Procedure: COLONOSCOPY WITH PROPOFOL;  Surgeon: Robert Bellow, MD;  Location: [facility] ENDOSCOPY;  Service:  Endoscopy;  Laterality: N/A;   CORONARY ANGIOPLASTY WITH STENT PLACEMENT N/A 07/24/2006   75% pRCA; 3.5 x 28 mm Cypher DES placed; Location: [place]; Surgeons: Katrine Coho, MD   ESOPHAGOGASTRODUODENOSCOPY (EGD) WITH PROPOFOL N/A 02/02/2018   Procedure: ESOPHAGOGASTRODUODENOSCOPY (EGD) WITH PROPOFOL;  Surgeon: Manya Silvas, MD;  Location: [facility] ENDOSCOPY;  Service: Endoscopy;  Laterality: N/A;   ESOPHAGOGASTRODUODENOSCOPY (EGD) WITH PROPOFOL N/A 03/29/2020   Procedure: ESOPHAGOGASTRODUODENOSCOPY (EGD) WITH PROPOFOL;  Surgeon: Robert Bellow, MD;  Location: [facility] ENDOSCOPY;  Service: Endoscopy;  Laterality: N/A;   EYE SURGERY     JOINT REPLACEMENT     bilateral knee replacements   KNEE ARTHROSCOPY  Arthroscopic left knee surgery    KNEE SURGERY  status post knee surgey    LEFT HEART CATH AND CORONARY ANGIOGRAPHY Left 05/14/2018   Procedure: LEFT HEART CATH AND CORONARY ANGIOGRAPHY;  Surgeon: Corey Skains, MD;  Location: [place] CV LAB;  Service: Cardiovascular;  Laterality: Left;   REPLACEMENT TOTAL KNEE  ([facility])   SHOULDER SURGERY  shoulder operation secondary to a torn tendon   TOTAL HIP ARTHROPLASTY Left 06/12/2021   Procedure: TOTAL HIP ARTHROPLASTY;  Surgeon: Corky Mull, MD;  Location: [facility] ORS;  Service: Orthopedics;  Laterality: Left;    Family History  Problem Relation Age of Onset   Other Mother        Hit by a fire truck and has had multiple operations on her back , and has history of MVP    Mitral valve prolapse Mother    Lung cancer Mother    Depression Mother    Heart disease Father        myocardial infarction and is status post bypass surgery   Mitral valve prolapse Sister    Bipolar disorder Sister    Hepatitis C Brother    Cirrhosis Brother    Colon cancer Paternal Aunt    Breast cancer Neg Hx    Prostate cancer Neg Hx    Bladder Cancer Neg Hx    Kidney cancer Neg Hx     SOCIAL HX: reviewed.    Current Outpatient Medications:     acetaminophen (TYLENOL) 325 MG tablet, Take 650 mg by mouth every 6 (six) hours as needed., Disp: , Rfl:    albuterol (VENTOLIN HFA) 108 (90 Base) MCG/ACT inhaler, INHALE 2 PUFFS FOUR TIMES A DAY, Disp: 8.5 g, Rfl: 11   amLODipine (NORVASC) 5 MG tablet, TAKE 1 TABLET EVERY DAY, Disp: 90 tablet, Rfl: 1   apixaban (ELIQUIS) 5 MG TABS tablet, Take 1 tablet (5 mg total) by mouth 2 (two) times daily., Disp: 60 tablet, Rfl: 2   Blood Glucose Monitoring Suppl (TRUE METRIX METER) w/Device KIT, , Disp: , Rfl:    Budeson-Glycopyrrol-Formoterol (BREZTRI AEROSPHERE) 160-9-4.8 MCG/ACT AERO, Inhale 2 puffs into the lungs in the morning and at  bedtime., Disp: 32.1 g, Rfl: 3   CALCIUM PO, Take 600 mg by mouth daily. , Disp: , Rfl:    cholecalciferol (VITAMIN D3) 25 MCG (1000 UNIT) tablet, Take 1,000 Units by mouth daily., Disp: , Rfl:    Cyanocobalamin (VITAMIN B-12 PO), Take 2,500 mcg by mouth daily., Disp: , Rfl:    DULoxetine (CYMBALTA) 60 MG capsule, Take 1 capsule (60 mg total) by mouth daily., Disp: 90 capsule, Rfl: 1   eszopiclone (LUNESTA) 1 MG TABS tablet, Take 2 tablets (2 mg total) by mouth at bedtime as needed for sleep. Take immediately before bedtime, Disp: 90 tablet, Rfl: 1   famotidine (PEPCID) 40 MG tablet, TAKE 1 TABLET (40 MG TOTAL) BY MOUTH DAILY., Disp: 90 tablet, Rfl: 1   gabapentin (NEURONTIN) 600 MG tablet, Take 1 tablet (600 mg total) by mouth at bedtime., Disp: 90 tablet, Rfl: 0   isosorbide mononitrate (IMDUR) 30 MG 24 hr tablet, TAKE 1 TABLET EVERY DAY, Disp: 90 tablet, Rfl: 1   Multiple Vitamin (MULTIVITAMIN) tablet, Take 1 tablet by mouth daily., Disp: , Rfl:    Multiple Vitamins-Minerals (HAIR SKIN AND NAILS FORMULA PO), Take 2 tablets by mouth daily., Disp: , Rfl:    nystatin (MYCOSTATIN/NYSTOP) powder, Apply topically 2 (two) times daily. to affected area(s), Disp: 30 g, Rfl: 0   oxyCODONE (OXY IR/ROXICODONE) 5 MG immediate release tablet, Take 1-2 tablets (5-10 mg total) by mouth  every 4 (four) hours as needed for moderate pain (pain score 4-6)., Disp: 20 tablet, Rfl: 0   pantoprazole (PROTONIX) 40 MG tablet, TAKE 1 TABLET TWICE DAILY BEFORE MEALS, Disp: 180 tablet, Rfl: 1   propranolol ER (INDERAL LA) 60 MG 24 hr capsule, Take 1 capsule (60 mg total) by mouth daily., Disp: 90 capsule, Rfl: 3   rosuvastatin (CRESTOR) 20 MG tablet, TAKE 1 TABLET EVERY DAY, Disp: 90 tablet, Rfl: 1   Semaglutide,0.25 or 0.5MG/DOS, (OZEMPIC, 0.25 OR 0.5 MG/DOSE,) 2 MG/1.5ML SOPN, Inject 0.25 mg weekly for 4 weeks then increase to 0.5 mg weekly, Disp: 1.5 mL, Rfl: 2   telmisartan (MICARDIS) 80 MG tablet, TAKE 1 TABLET EVERY DAY, Disp: 90 tablet, Rfl: 1   trimethoprim (TRIMPEX) 100 MG tablet, Take 1 tablet (100 mg total) by mouth daily., Disp: 30 tablet, Rfl: 11   TRUE METRIX BLOOD GLUCOSE TEST test strip, USE AS INSTRUCTED TO CHECK BLOOD SUGARS TWICE DAILY., Disp: 200 strip, Rfl: 1   Vibegron (GEMTESA) 75 MG TABS, Take 75 mg by mouth daily., Disp: 30 tablet, Rfl: 11  EXAM:   GENERAL: alert. Sounds to be in no acute distress.  Answering questions appropriately.   PSYCH/NEURO: cooperative.  speech and thought processing grossly intact.  ASSESSMENT AND PLAN:  Discussed the following assessment and plan:  Problem List Items Addressed This Visit     Aortic atherosclerosis ([place])    Continue lipitor.       CAD (coronary artery disease)    S/p stent placement.  Continue lipitor. Followed by cardiology.  On eliquis.  Needs to continue as outlined.  Discussed.       Carotid artery disease (HCC)    Continue lipitor.  Continue eliquis.        Essential hypertension, benign     Continue micardis 89m q day.  Follow pressures.  Follow metabolic panel.       MDD (major depressive disorder), recurrent episode, moderate [place]    On cymbalta.  Increased depression related to recent passing of her husband.  Discussed.  No current SI as outlined.  Seeing Dr Shea Evans.  Has emergency contact number  if needed.  Follow.        Obstructive sleep apnea    CPAP.       Paroxysmal A-fib ([employer])    Found to have afib.  Has been on eliquis.  No increased heart rate or palpitations.  Follow. Discussed the need to continue eliquis.  States she cannot afford.  Working with pharmacist.  Some samples provided.  She will contact cardiology as well.  Discussed with pharmacy regarding requirements for coverage.        Return in about 4 weeks (around 02/12/2022) for follow up appt (59mn).   I discussed the assessment and treatment plan with the patient. The patient was provided an opportunity to ask questions and all were answered. The patient agreed with the plan and demonstrated an understanding of the instructions.   The patient was advised to call back or seek an in-person evaluation if the symptoms worsen or if the condition fails to improve as anticipated.  I provided 25 minutes of non-face-to-face time during this encounter.   CEinar Pheasant MD

## 2022-01-15 NOTE — Telephone Encounter (Signed)
See my chart response .

## 2022-01-15 NOTE — Telephone Encounter (Signed)
Pt called in requesting a time for virtual appt with Dr. Nicki Reaper. Pt stated she needs a callback asap because she doesn't have time to sit around waiting on a call. Pt requesting callback

## 2022-01-18 ENCOUNTER — Telehealth: Payer: Self-pay | Admitting: Pharmacist

## 2022-01-18 NOTE — Telephone Encounter (Signed)
**Note Kathryn-Identified via Obfuscation** Medication Samples have been provided to the patient.  Drug name: Eliquis       Strength: 5 mg        Qty: 12   LOT: [id number]  Exp.Date: 01/2024  Dosing instructions: Take 1 tablet by mouth twice daily  The patient has been instructed regarding the correct time, dose, and frequency of taking this medication, including desired effects and most common side effects.   Kathryn Friedman 8:18 AM 01/18/2022

## 2022-01-21 ENCOUNTER — Other Ambulatory Visit: Payer: Self-pay | Admitting: Family Medicine

## 2022-01-21 ENCOUNTER — Ambulatory Visit: Payer: Medicare HMO | Admitting: Urology

## 2022-01-21 ENCOUNTER — Encounter: Payer: Self-pay | Admitting: Internal Medicine

## 2022-01-21 MED ORDER — TRIMETHOPRIM 100 MG PO TABS: 100.0000 mg | ORAL_TABLET | Freq: Every day | ORAL | 11 refills | Status: DC

## 2022-01-21 NOTE — Assessment & Plan Note (Signed)
CPAP.  

## 2022-01-21 NOTE — Assessment & Plan Note (Signed)
S/p stent placement.  Continue lipitor. Followed by cardiology.  On eliquis.  Needs to continue as outlined.  Discussed.

## 2022-01-21 NOTE — Assessment & Plan Note (Signed)
Continue lipitor  ?

## 2022-01-21 NOTE — Assessment & Plan Note (Addendum)
Continue micardis 80mg  q day.  Follow pressures.  Follow metabolic panel.

## 2022-01-21 NOTE — Assessment & Plan Note (Signed)
Continue lipitor.  Continue eliquis.   

## 2022-01-21 NOTE — Assessment & Plan Note (Signed)
On cymbalta.  Increased depression related to recent passing of her husband.  Discussed.  No current SI as outlined.  Seeing Dr Shea Evans.  Has emergency contact number if needed.  Follow.

## 2022-01-21 NOTE — Assessment & Plan Note (Signed)
Found to have afib.  Has been on eliquis.  No increased heart rate or palpitations.  Follow. Discussed the need to continue eliquis.  States she cannot afford.  Working with pharmacist.  Some samples provided.  She will contact cardiology as well.  Discussed with pharmacy regarding requirements for coverage.

## 2022-01-22 DIAGNOSIS — J449 Chronic obstructive pulmonary disease, unspecified: Secondary | ICD-10-CM | POA: Diagnosis not present

## 2022-01-22 DIAGNOSIS — I2510 Atherosclerotic heart disease of native coronary artery without angina pectoris: Secondary | ICD-10-CM | POA: Diagnosis not present

## 2022-01-22 DIAGNOSIS — E1159 Type 2 diabetes mellitus with other circulatory complications: Secondary | ICD-10-CM | POA: Diagnosis not present

## 2022-01-22 DIAGNOSIS — I10 Essential (primary) hypertension: Secondary | ICD-10-CM

## 2022-01-22 DIAGNOSIS — I480 Paroxysmal atrial fibrillation: Secondary | ICD-10-CM

## 2022-01-23 ENCOUNTER — Encounter: Payer: Self-pay | Admitting: Internal Medicine

## 2022-01-29 ENCOUNTER — Ambulatory Visit: Payer: Self-pay | Admitting: Pharmacist

## 2022-01-29 ENCOUNTER — Telehealth: Payer: Self-pay | Admitting: Pharmacist

## 2022-01-29 NOTE — Chronic Care Management (AMB) (Signed)
?  Chronic Care Management  ? ?Note ? ?01/29/2022 ?Name: KARENNA ROMANOFF MRN: [id number] DOB: [birth year] ? ? ? ?Closing pharmacy CCM case at this time. Will collaborate with Care Guide to outreach to schedule follow up with RN CM. Patient has clinic contact information for future questions or concerns.  ? ?[name], PharmD, [place], CPP ?Clinical Pharmacist ?Therapist, music at [employer] ?[phone] ? ?

## 2022-01-29 NOTE — Telephone Encounter (Signed)
Spoke with patient about pharmacy transition. Patient amenable to RN CM referral  ?

## 2022-01-31 ENCOUNTER — Encounter: Payer: Self-pay | Admitting: Internal Medicine

## 2022-02-01 ENCOUNTER — Telehealth: Payer: Medicare HMO | Admitting: Psychiatry

## 2022-02-04 ENCOUNTER — Encounter: Payer: Self-pay | Admitting: Internal Medicine

## 2022-02-04 ENCOUNTER — Telehealth: Payer: Self-pay

## 2022-02-04 DIAGNOSIS — G4701 Insomnia due to medical condition: Secondary | ICD-10-CM

## 2022-02-04 MED ORDER — ESZOPICLONE 2 MG PO TABS: 2.0000 mg | ORAL_TABLET | Freq: Every evening | ORAL | 0 refills | Status: DC | PRN

## 2022-02-04 NOTE — Telephone Encounter (Signed)
pt states the pharmacy did not get the eszopiclone '1mg'$  please send to [place]  ?

## 2022-02-04 NOTE — Telephone Encounter (Signed)
Since patient had enough supplies of her Lunesta 1 mg - 90-day supply and her dosage was recently increased to 2 mg, she was supposed to call us back once she is out of her Lunesta 1 mg supplies to send new prescription for the dosage increase to her pharmacy. ? ?I have sent Lunesta 2 mg to her pharmacy today. ? ?Please let her know if she has any other concerns to call us back or if she has trouble filling this prescription. ?

## 2022-02-04 NOTE — Telephone Encounter (Signed)
Closing note pt was told at the time note was taken to check with her pharmacy later today.  ?

## 2022-02-05 ENCOUNTER — Telehealth: Payer: Self-pay | Admitting: Internal Medicine

## 2022-02-05 ENCOUNTER — Telehealth (INDEPENDENT_AMBULATORY_CARE_PROVIDER_SITE_OTHER): Payer: Medicare HMO | Admitting: Internal Medicine

## 2022-02-05 DIAGNOSIS — I480 Paroxysmal atrial fibrillation: Secondary | ICD-10-CM

## 2022-02-05 DIAGNOSIS — J849 Interstitial pulmonary disease, unspecified: Secondary | ICD-10-CM

## 2022-02-05 DIAGNOSIS — I779 Disorder of arteries and arterioles, unspecified: Secondary | ICD-10-CM | POA: Diagnosis not present

## 2022-02-05 DIAGNOSIS — K219 Gastro-esophageal reflux disease without esophagitis: Secondary | ICD-10-CM

## 2022-02-05 DIAGNOSIS — J449 Chronic obstructive pulmonary disease, unspecified: Secondary | ICD-10-CM

## 2022-02-05 DIAGNOSIS — I2510 Atherosclerotic heart disease of native coronary artery without angina pectoris: Secondary | ICD-10-CM | POA: Diagnosis not present

## 2022-02-05 DIAGNOSIS — I700 Atherosclerosis of aorta: Secondary | ICD-10-CM | POA: Diagnosis not present

## 2022-02-05 DIAGNOSIS — G4733 Obstructive sleep apnea (adult) (pediatric): Secondary | ICD-10-CM

## 2022-02-05 DIAGNOSIS — E7800 Pure hypercholesterolemia, unspecified: Secondary | ICD-10-CM

## 2022-02-05 DIAGNOSIS — I10 Essential (primary) hypertension: Secondary | ICD-10-CM | POA: Diagnosis not present

## 2022-02-05 DIAGNOSIS — F17200 Nicotine dependence, unspecified, uncomplicated: Secondary | ICD-10-CM

## 2022-02-05 DIAGNOSIS — E1159 Type 2 diabetes mellitus with other circulatory complications: Secondary | ICD-10-CM

## 2022-02-05 DIAGNOSIS — F331 Major depressive disorder, recurrent, moderate: Secondary | ICD-10-CM

## 2022-02-05 MED ORDER — AMLODIPINE BESYLATE 5 MG PO TABS: 5.0000 mg | ORAL_TABLET | Freq: Every day | ORAL | 1 refills | Status: DC

## 2022-02-05 MED ORDER — MUPIROCIN 2 % EX OINT
1.0000 | TOPICAL_OINTMENT | Freq: Two times a day (BID) | CUTANEOUS | 0 refills | Status: DC
Start: 2022-02-05 — End: 2022-03-06

## 2022-02-05 NOTE — Telephone Encounter (Signed)
Patient has a virtual with Dr Nicki Reaper today. She put patient on patient is already on amLODipine (NORVASC) 5 MG tablet. She needs to put her on something else. It is not time to fill her amLODipine (NORVASC) 5 MG tablet. ? ?She also checked on her Ozempic , she still has not received it, and at this time she doesn't care if she gets it or not. ?

## 2022-02-05 NOTE — Telephone Encounter (Signed)
Ozempic was received. Patient is aware and will pick up Friday. Labeled and placed in refrigerator. Bactroban was received. Patient says that you were starting her on a new blood pressure medication to add to what she is currently taking. I was not sure about this. ?

## 2022-02-05 NOTE — Telephone Encounter (Signed)
I sent in amlodipine and bactroban to her pharmacy.   ?

## 2022-02-05 NOTE — Progress Notes (Signed)
Patient ID: Kathryn Friedman, female   DOB: 1956/08/21, 66 y.o.   MRN: [id number] ? ? ?Virtual Visit via video Note ? ?This visit type was conducted due to national recommendations for restrictions regarding the COVID-19 pandemic (e.g. social distancing).  This format is felt to be most appropriate for this patient at this time.  All issues noted in this document were discussed and addressed.  No physical exam was performed (except for noted visual exam findings with Video Visits).  ? ?I connected with Kathryn Friedman by a video enabled telemedicine application and verified that I am speaking with the correct person using two identifiers. ?Location patient: home ?Location provider: work  ?Persons participating in the virtual visit: patient, provider ? ?The limitations, risks, security and privacy concerns of performing an evaluation and management service by video and the availability of in person appointments have been discussed.  It has also been discussed with the patient that there may be a patient responsible charge related to this service. The patient expressed understanding and agreed to proceed. ? ? ?Reason for visit:  scheduled follow up.   ? ?HPI: ?Follow up regarding her blood pressure, cholesterol, breathing and increased stress.  Can scratched her wrist.  Has been applying bactroban.  Is some better.  No fever.  No headache.  No chest pain.  Breathing stable.  Using breztri. Still with increased stress - husband passed.  Sees Dr Shea Evans.  Discussed.  Appears to be doing some better.  No SI.  Eating.  No vomiting reported.  On ozempic.  Blood sugars - am 120-150s.  PM 140-160s.  Blood pressures remaining 140-150s/80-90s.  Discussed medications.  She is taking micardis and amlodipine 76m q day.   ? ? ?ROS: See pertinent positives and negatives per HPI. ? ?Past Medical History:  ?Diagnosis Date  ? Anemia   ? Anginal pain ([place]   ? Anxiety   ? Arthritis   ? back and knees  ? Asthma   ? Bilateral  carotid artery stenosis   ? Blood in stool   ? Chronic diarrhea   ? COPD (chronic obstructive pulmonary disease) ([place]   ? Coronary artery disease   ? a.) 75% pRCA; 3.5 x 28 mm Cypher DES placed on 07/24/2006  ? Current use of long term anticoagulation   ? Clopidogrel  ? Depression   ? secondary to the death of her husband (died 89  ? Diverticulitis   ? Diverticulosis   ? Dizzinesses   ? Dysphagia   ? Dyspnea   ? Fatty infiltration of liver   ? GERD (gastroesophageal reflux disease)   ? Headache   ? History of 2019 novel coronavirus disease (COVID-19) 12/09/2020  ? History of 2019 novel coronavirus disease (COVID-19) 12/20/2020  ? Hypertension   ? Hypertriglyceridemia   ? ILD (interstitial lung disease) ([place]   ? Lump in the abdomen   ? OSA on CPAP   ? Overactive bladder   ? PSVT (paroxysmal supraventricular tachycardia) ([place]   ? Spastic colon   ? T2DM (type 2 diabetes mellitus) ([place] 05/2008  ? Tobacco abuse   ? Venous insufficiency of both lower extremities   ? ? ?Past Surgical History:  ?Procedure Laterality Date  ? ABDOMINAL HYSTERECTOMY  with left ovary in place 1996  ? APPENDECTOMY  1985  ? BREAST BIOPSY Left 10/14/2017  ? calcs bx, fibrosis giant cell reaction and chronic inflammation, negative for malignancy.   ? CATARACT EXTRACTION, BILATERAL    ? CESAREAN  SECTION  1984  ? CHOLECYSTECTOMY  1985  ? COLONOSCOPY WITH PROPOFOL N/A 09/13/2016  ? Procedure: COLONOSCOPY WITH PROPOFOL;  Surgeon: Manya Silvas, MD;  Location: [facility] ENDOSCOPY;  Service: Endoscopy;  Laterality: N/A;  ? COLONOSCOPY WITH PROPOFOL N/A 11/09/2018  ? Procedure: COLONOSCOPY WITH PROPOFOL;  Surgeon: Manya Silvas, MD;  Location: [facility] ENDOSCOPY;  Service: Endoscopy;  Laterality: N/A;  ? COLONOSCOPY WITH PROPOFOL N/A 03/29/2020  ? Procedure: COLONOSCOPY WITH PROPOFOL;  Surgeon: Robert Bellow, MD;  Location: [facility] ENDOSCOPY;  Service: Endoscopy;  Laterality: N/A;  ? CORONARY ANGIOPLASTY WITH STENT PLACEMENT N/A 07/24/2006  ? 75%  pRCA; 3.5 x 28 mm Cypher DES placed; Location: [facility]; Surgeons: Katrine Coho, MD  ? ESOPHAGOGASTRODUODENOSCOPY (EGD) WITH PROPOFOL N/A 02/02/2018  ? Procedure: ESOPHAGOGASTRODUODENOSCOPY (EGD) WITH PROPOFOL;  Surgeon: Manya Silvas, MD;  Location: [facility] ENDOSCOPY;  Service: Endoscopy;  Laterality: N/A;  ? ESOPHAGOGASTRODUODENOSCOPY (EGD) WITH PROPOFOL N/A 03/29/2020  ? Procedure: ESOPHAGOGASTRODUODENOSCOPY (EGD) WITH PROPOFOL;  Surgeon: Robert Bellow, MD;  Location: [facility] ENDOSCOPY;  Service: Endoscopy;  Laterality: N/A;  ? EYE SURGERY    ? JOINT REPLACEMENT    ? bilateral knee replacements  ? KNEE ARTHROSCOPY  Arthroscopic left knee surgery   ? KNEE SURGERY  status post knee surgey   ? LEFT HEART CATH AND CORONARY ANGIOGRAPHY Left 05/14/2018  ? Procedure: LEFT HEART CATH AND CORONARY ANGIOGRAPHY;  Surgeon: Corey Skains, MD;  Location: [place] CV LAB;  Service: Cardiovascular;  Laterality: Left;  ? REPLACEMENT TOTAL KNEE  ([facility])  ? SHOULDER SURGERY  shoulder operation secondary to a torn tendon  ? TOTAL HIP ARTHROPLASTY Left 06/12/2021  ? Procedure: TOTAL HIP ARTHROPLASTY;  Surgeon: Corky Mull, MD;  Location: [facility] ORS;  Service: Orthopedics;  Laterality: Left;  ? ? ?Family History  ?Problem Relation Age of Onset  ? Other Mother   ?     Hit by a fire truck and has had multiple operations on her back , and has history of MVP   ? Mitral valve prolapse Mother   ? Lung cancer Mother   ? Depression Mother   ? Heart disease Father   ?     myocardial infarction and is status post bypass surgery  ? Mitral valve prolapse Sister   ? Bipolar disorder Sister   ? Hepatitis C Brother   ? Cirrhosis Brother   ? Colon cancer Paternal Aunt   ? Breast cancer Neg Hx   ? Prostate cancer Neg Hx   ? Bladder Cancer Neg Hx   ? Kidney cancer Neg Hx   ? ? ?SOCIAL HX: reviewed.  ? ? ?Current Outpatient Medications:  ?  mupirocin ointment (BACTROBAN) 2 %, Apply 1 application. topically 2 (two) times daily., Disp: 22 g, Rfl:  0 ?  acetaminophen (TYLENOL) 325 MG tablet, Take 650 mg by mouth every 6 (six) hours as needed., Disp: , Rfl:  ?  albuterol (VENTOLIN HFA) 108 (90 Base) MCG/ACT inhaler, INHALE 2 PUFFS FOUR TIMES A DAY, Disp: 8.5 g, Rfl: 11 ?  amLODipine (NORVASC) 10 MG tablet, Take 1 tablet (10 mg total) by mouth daily., Disp: 90 tablet, Rfl: 1 ?  apixaban (ELIQUIS) 5 MG TABS tablet, Take 1 tablet (5 mg total) by mouth 2 (two) times daily., Disp: 60 tablet, Rfl: 2 ?  Blood Glucose Monitoring Suppl (TRUE METRIX METER) w/Device KIT, , Disp: , Rfl:  ?  Budeson-Glycopyrrol-Formoterol (BREZTRI AEROSPHERE) 160-9-4.8 MCG/ACT AERO, Inhale 2 puffs into the lungs in  the morning and at bedtime., Disp: 32.1 g, Rfl: 3 ?  CALCIUM PO, Take 600 mg by mouth daily. , Disp: , Rfl:  ?  cholecalciferol (VITAMIN D3) 25 MCG (1000 UNIT) tablet, Take 1,000 Units by mouth daily., Disp: , Rfl:  ?  Cyanocobalamin (VITAMIN B-12 PO), Take 2,500 mcg by mouth daily., Disp: , Rfl:  ?  DULoxetine (CYMBALTA) 60 MG capsule, Take 1 capsule (60 mg total) by mouth daily., Disp: 90 capsule, Rfl: 1 ?  eszopiclone (LUNESTA) 2 MG TABS tablet, Take 1 tablet (2 mg total) by mouth at bedtime as needed for sleep. Take immediately before bedtime, Disp: 90 tablet, Rfl: 0 ?  famotidine (PEPCID) 40 MG tablet, TAKE 1 TABLET (40 MG TOTAL) BY MOUTH DAILY., Disp: 90 tablet, Rfl: 1 ?  gabapentin (NEURONTIN) 600 MG tablet, Take 1 tablet (600 mg total) by mouth at bedtime. (Patient taking differently: Take 1,200 mg by mouth at bedtime.), Disp: 90 tablet, Rfl: 0 ?  isosorbide mononitrate (IMDUR) 30 MG 24 hr tablet, TAKE 1 TABLET EVERY DAY, Disp: 90 tablet, Rfl: 1 ?  Multiple Vitamin (MULTIVITAMIN) tablet, Take 1 tablet by mouth daily., Disp: , Rfl:  ?  Multiple Vitamins-Minerals (HAIR SKIN AND NAILS FORMULA PO), Take 2 tablets by mouth daily., Disp: , Rfl:  ?  nystatin (MYCOSTATIN/NYSTOP) powder, Apply topically 2 (two) times daily. to affected area(s), Disp: 30 g, Rfl: 0 ?  pantoprazole  (PROTONIX) 40 MG tablet, TAKE 1 TABLET TWICE DAILY BEFORE MEALS, Disp: 180 tablet, Rfl: 1 ?  propranolol ER (INDERAL LA) 60 MG 24 hr capsule, Take 1 capsule (60 mg total) by mouth daily., Disp: 90 caps

## 2022-02-06 ENCOUNTER — Other Ambulatory Visit: Payer: Self-pay

## 2022-02-06 MED ORDER — AMLODIPINE BESYLATE 10 MG PO TABS: 10.0000 mg | ORAL_TABLET | Freq: Every day | ORAL | 1 refills | Status: DC

## 2022-02-06 NOTE — Progress Notes (Signed)
Cardiology Office Note ? ?Date:  02/08/2022  ? ?ID:  REEVA DAVERN, DOB 14-Sep-1956, MRN [id number] ? ?PCP:  Einar Pheasant, MD  ? ?Chief Complaint  ?Patient presents with  ? Recent fall   ?  Patient fell on Sunday, February 03, 2022 and was told by Einar Pheasant, MD to be evaluated for decrease blood pressure. Medications reviewed by the patient verbally.   ? ? ?HPI:  ?Ms. Ixchel Fredderick Severance Wuebker is a 66 year old woman with past medical history of ?Depression ?Coronary artery disease catheterization June 2019 moderate to severe proximal and mid RCA disease occluded distal RCA with collaterals left to right mild LAD and circumflex disease ?Stent x2 2007 ?Smoking history/COPD ?Hyperlipidemia ?Who presents for f/u of her coronary disease, PAF per holter monitor at [facility] ? ?Note indicating history of DVT ?Unclear history paroxysmal atrial fibrillation, on Eliquis ? ?Long discussion concerning recent events ?Reports having episode of near syncope in shower, ?"Woke up on the ground" in the shower but reports never losing consciousness ?Minimal warning ?"Could it be atrial fibrillation" ?Has not had any further episodes ? ?At home discussed her vitals ?Pulse 90 to 100 ?BP 150/80-90 ?PMD increased amlodipine up to 10 mg daily ?Has never had any low blood pressures at home ? ?Husband died, fall, On hospice ? ?stress, continues to smoke ?Chronic SOB ?Uses a cane ?Wheelchair today ?Lab work reviewed ?A1C 6.4 ?Total chol 102, LDL 41 ? ?Carotid u/s  07/2020 ?1. Minimal atherosclerotic plaque bilaterally resulting in less than ?50% stenosis of the bilateral ICAs. ?2. Antegrade flow is noted within both vertebral arteries. ? ?Echo ?NORMAL LEFT VENTRICULAR SYSTOLIC FUNCTION  ?NORMAL RIGHT VENTRICULAR SYSTOLIC FUNCTION  ?MILD VALVULAR REGURGITATION (See above)  ?NO VALVULAR STENOSIS  ?MILD MR, TR  ?EF 55%  ? ?EKG personally reviewed by myself on todays visit ?Nsr rate 77 bpm no ST or T wave changes ? ?Underwent hip  replacement ?Following surgery developed acute problem with a hip ?Seen in the ER after hearing a popping sound followed by right leg/hip pain.  Found to have avulsion fracture of greater trochanter left hip ?DVT prophylaxis following procedure Eliquis 2.5 twice daily ?Was started on Eliquis 2.5 twice daily by Dr. Roland Rack at discharge June 13, 2021 ? ?Hospitalist June 20, 2021 and H&P note reported paroxysmal atrial fibrillation with no documentation of this, presumably because she was on anticoagulation ? ?On July 03, 2021, [place] through Dr. Alveria Apley office change the dose from 2.5 up to 5 twice daily presuming she had atrial fibrillation ? ?-Follow-up visit with Dr. Nehemiah Massed on July 04, 2021 did not detail any documentation of atrial fibrillation.  On that visit heart detailed as being regular rhythm ? ? ? ? ?PMH:   has a past medical history of Anemia, Anginal pain ([place]), Anxiety, Arthritis, Asthma, Bilateral carotid artery stenosis, Blood in stool, Chronic diarrhea, COPD (chronic obstructive pulmonary disease) ([place]), Coronary artery disease, Current use of long term anticoagulation, Depression, Diverticulitis, Diverticulosis, Dizzinesses, Dysphagia, Dyspnea, Fatty infiltration of liver, GERD (gastroesophageal reflux disease), Headache, History of 2019 novel coronavirus disease (COVID-19) (12/09/2020), History of 2019 novel coronavirus disease (COVID-19) (12/20/2020), Hypertension, Hypertriglyceridemia, ILD (interstitial lung disease) ([place]), Lump in the abdomen, OSA on CPAP, Overactive bladder, PSVT (paroxysmal supraventricular tachycardia) ([place]), Spastic colon, T2DM (type 2 diabetes mellitus) ([place]) (05/2008), Tobacco abuse, and Venous insufficiency of both lower extremities. ? ?PSH:    ?Past Surgical History:  ?Procedure Laterality Date  ? ABDOMINAL HYSTERECTOMY  with left ovary in place 1996  ? APPENDECTOMY  1985  ? BREAST BIOPSY Left 10/14/2017  ? calcs bx, fibrosis giant cell reaction and chronic  inflammation, negative for malignancy.   ? CATARACT EXTRACTION, BILATERAL    ? [place]  ? CHOLECYSTECTOMY  1985  ? COLONOSCOPY WITH PROPOFOL N/A 09/13/2016  ? Procedure: COLONOSCOPY WITH PROPOFOL;  Surgeon: Manya Silvas, MD;  Location: [facility] ENDOSCOPY;  Service: Endoscopy;  Laterality: N/A;  ? COLONOSCOPY WITH PROPOFOL N/A 11/09/2018  ? Procedure: COLONOSCOPY WITH PROPOFOL;  Surgeon: Manya Silvas, MD;  Location: [facility] ENDOSCOPY;  Service: Endoscopy;  Laterality: N/A;  ? COLONOSCOPY WITH PROPOFOL N/A 03/29/2020  ? Procedure: COLONOSCOPY WITH PROPOFOL;  Surgeon: Robert Bellow, MD;  Location: [facility] ENDOSCOPY;  Service: Endoscopy;  Laterality: N/A;  ? CORONARY ANGIOPLASTY WITH STENT PLACEMENT N/A 07/24/2006  ? 75% pRCA; 3.5 x 28 mm Cypher DES placed; Location: [facility]; Surgeons: Katrine Coho, MD  ? ESOPHAGOGASTRODUODENOSCOPY (EGD) WITH PROPOFOL N/A 02/02/2018  ? Procedure: ESOPHAGOGASTRODUODENOSCOPY (EGD) WITH PROPOFOL;  Surgeon: Manya Silvas, MD;  Location: [facility] ENDOSCOPY;  Service: Endoscopy;  Laterality: N/A;  ? ESOPHAGOGASTRODUODENOSCOPY (EGD) WITH PROPOFOL N/A 03/29/2020  ? Procedure: ESOPHAGOGASTRODUODENOSCOPY (EGD) WITH PROPOFOL;  Surgeon: Robert Bellow, MD;  Location: [facility] ENDOSCOPY;  Service: Endoscopy;  Laterality: N/A;  ? EYE SURGERY    ? JOINT REPLACEMENT    ? bilateral knee replacements  ? KNEE ARTHROSCOPY  Arthroscopic left knee surgery   ? KNEE SURGERY  status post knee surgey   ? LEFT HEART CATH AND CORONARY ANGIOGRAPHY Left 05/14/2018  ? Procedure: LEFT HEART CATH AND CORONARY ANGIOGRAPHY;  Surgeon: Corey Skains, MD;  Location: [place] CV LAB;  Service: Cardiovascular;  Laterality: Left;  ? REPLACEMENT TOTAL KNEE  ([facility])  ? SHOULDER SURGERY  shoulder operation secondary to a torn tendon  ? TOTAL HIP ARTHROPLASTY Left 06/12/2021  ? Procedure: TOTAL HIP ARTHROPLASTY;  Surgeon: Corky Mull, MD;  Location: [facility] ORS;  Service: Orthopedics;  Laterality: Left;   ? ? ?Current Outpatient Medications  ?Medication Sig Dispense Refill  ? acetaminophen (TYLENOL) 325 MG tablet Take 650 mg by mouth every 6 (six) hours as needed.    ? albuterol (VENTOLIN HFA) 108 (90 Base) MCG/ACT inhaler INHALE 2 PUFFS FOUR TIMES A DAY 8.5 g 11  ? amLODipine (NORVASC) 10 MG tablet Take 1 tablet (10 mg total) by mouth daily. 90 tablet 1  ? apixaban (ELIQUIS) 5 MG TABS tablet Take 1 tablet (5 mg total) by mouth 2 (two) times daily. 60 tablet 2  ? Blood Glucose Monitoring Suppl (TRUE METRIX METER) w/Device KIT     ? Budeson-Glycopyrrol-Formoterol (BREZTRI AEROSPHERE) 160-9-4.8 MCG/ACT AERO Inhale 2 puffs into the lungs in the morning and at bedtime. 32.1 g 3  ? CALCIUM PO Take 600 mg by mouth daily.     ? cholecalciferol (VITAMIN D3) 25 MCG (1000 UNIT) tablet Take 1,000 Units by mouth daily.    ? Cyanocobalamin (VITAMIN B-12 PO) Take 2,500 mcg by mouth daily.    ? DULoxetine (CYMBALTA) 60 MG capsule Take 1 capsule (60 mg total) by mouth daily. 90 capsule 1  ? eszopiclone (LUNESTA) 2 MG TABS tablet Take 1 tablet (2 mg total) by mouth at bedtime as needed for sleep. Take immediately before bedtime 90 tablet 0  ? famotidine (PEPCID) 40 MG tablet TAKE 1 TABLET (40 MG TOTAL) BY MOUTH DAILY. 90 tablet 1  ? gabapentin (NEURONTIN) 600 MG tablet Take 1 tablet (600 mg total) by mouth at bedtime. (Patient taking  differently: Take 1,200 mg by mouth at bedtime.) 90 tablet 0  ? isosorbide mononitrate (IMDUR) 30 MG 24 hr tablet TAKE 1 TABLET EVERY DAY 90 tablet 1  ? Multiple Vitamin (MULTIVITAMIN) tablet Take 1 tablet by mouth daily.    ? Multiple Vitamins-Minerals (HAIR SKIN AND NAILS FORMULA PO) Take 2 tablets by mouth daily.    ? mupirocin ointment (BACTROBAN) 2 % Apply 1 application. topically 2 (two) times daily. 22 g 0  ? nystatin (MYCOSTATIN/NYSTOP) powder Apply topically 2 (two) times daily. to affected area(s) 30 g 0  ? pantoprazole (PROTONIX) 40 MG tablet TAKE 1 TABLET TWICE DAILY BEFORE MEALS 180 tablet  1  ? propranolol ER (INDERAL LA) 60 MG 24 hr capsule Take 1 capsule (60 mg total) by mouth daily. 90 capsule 3  ? rosuvastatin (CRESTOR) 20 MG tablet TAKE 1 TABLET EVERY DAY 90 tablet 1  ? Semaglutide,0.25 or 0.5MG/DOS, (OZE

## 2022-02-06 NOTE — Telephone Encounter (Signed)
In reviewing, it appears she has been on amlodipine '5mg'$  q day.  Please confirm this is correct.  If this is correct, then increase amlodipine to '10mg'$  q day.  Will need new rx for '10mg'$  tablets.  ?

## 2022-02-06 NOTE — Telephone Encounter (Signed)
Sent in amlodipine 10 mg q day. She is going to take 2 of the 5 mgs until her 10 mg comes in the mail. ?

## 2022-02-06 NOTE — Telephone Encounter (Signed)
Patient says that she has been on amlodipine for years but she thought you were adding a new medication for her blood pressure ?

## 2022-02-08 ENCOUNTER — Ambulatory Visit: Payer: Medicare HMO | Admitting: Cardiovascular Disease

## 2022-02-08 ENCOUNTER — Encounter: Payer: Self-pay | Admitting: Cardiovascular Disease

## 2022-02-08 ENCOUNTER — Other Ambulatory Visit: Payer: Self-pay

## 2022-02-08 ENCOUNTER — Ambulatory Visit (INDEPENDENT_AMBULATORY_CARE_PROVIDER_SITE_OTHER): Payer: Medicare HMO

## 2022-02-08 ENCOUNTER — Telehealth: Payer: Self-pay

## 2022-02-08 VITALS — BP 130/64 | HR 77 | Ht 64.0 in | Wt 200.0 lb

## 2022-02-08 DIAGNOSIS — R002 Palpitations: Secondary | ICD-10-CM

## 2022-02-08 DIAGNOSIS — I6523 Occlusion and stenosis of bilateral carotid arteries: Secondary | ICD-10-CM | POA: Diagnosis not present

## 2022-02-08 DIAGNOSIS — S22040A Wedge compression fracture of fourth thoracic vertebra, initial encounter for closed fracture: Secondary | ICD-10-CM | POA: Insufficient documentation

## 2022-02-08 DIAGNOSIS — G8929 Other chronic pain: Secondary | ICD-10-CM | POA: Diagnosis not present

## 2022-02-08 DIAGNOSIS — I10 Essential (primary) hypertension: Secondary | ICD-10-CM | POA: Diagnosis not present

## 2022-02-08 DIAGNOSIS — I471 Supraventricular tachycardia: Secondary | ICD-10-CM

## 2022-02-08 DIAGNOSIS — R55 Syncope and collapse: Secondary | ICD-10-CM

## 2022-02-08 DIAGNOSIS — E7800 Pure hypercholesterolemia, unspecified: Secondary | ICD-10-CM

## 2022-02-08 DIAGNOSIS — M545 Low back pain, unspecified: Secondary | ICD-10-CM | POA: Diagnosis not present

## 2022-02-08 DIAGNOSIS — I700 Atherosclerosis of aorta: Secondary | ICD-10-CM

## 2022-02-08 DIAGNOSIS — I480 Paroxysmal atrial fibrillation: Secondary | ICD-10-CM

## 2022-02-08 DIAGNOSIS — E1159 Type 2 diabetes mellitus with other circulatory complications: Secondary | ICD-10-CM | POA: Diagnosis not present

## 2022-02-08 DIAGNOSIS — F17200 Nicotine dependence, unspecified, uncomplicated: Secondary | ICD-10-CM | POA: Diagnosis not present

## 2022-02-08 DIAGNOSIS — I25118 Atherosclerotic heart disease of native coronary artery with other forms of angina pectoris: Secondary | ICD-10-CM | POA: Diagnosis not present

## 2022-02-08 NOTE — Telephone Encounter (Signed)
Pt arrived to office today 02/08/22 to P/U her Ozempic. Medication was given to pt and verified her with 2 identifiers.  ?

## 2022-02-08 NOTE — Patient Instructions (Addendum)
Medication Instructions:  ?No changes ? ? ?Samples Given: ?Eliquis 5 mg ?Lot: [id number] ?Exp: March 2025 ?# 3 boxes  ? ? ?If you need a refill on your cardiac medications before your next appointment, please call your pharmacy.  ? ? ?Lab work: ?No new labs needed ? ? ?Testing/Procedures: ? ?1) Heart Monitor: ? ?Length of Wear: 14 days ?Placement: This will be mailed to your home address within 3-4 business days ? ?Your physician has recommended that you wear a Zio AT (real time heart) monitor.  ? ?This monitor is a medical device that records the heart?s electrical activity. Doctors most often use these monitors to diagnose arrhythmias. Arrhythmias are problems with the speed or rhythm of the heartbeat. The monitor is a small device applied to your chest. You can wear one while you do your normal daily activities. While wearing this monitor if you have any symptoms to push the button and record what you felt. Once you have worn this monitor for the period of time provider prescribed (Usually 14 days), you will return the monitor device in the postage paid box. Once it is returned they will download the data collected and provide [place] with a report which the provider will then review and we will call you with those results. Important tips: ? ?Avoid showering during the first 24 hours of wearing the monitor. ?Avoid excessive sweating to help maximize wear time. ?Do not submerge the device, no hot tubs, and no swimming pools. ?Keep any lotions or oils away from the patch. ?After 24 hours you may shower with the patch on. Take brief showers with your back facing the shower head.  ?Do not remove patch once it has been placed because that will interrupt data and decrease adhesive wear time. ?Push the button when you have any symptoms and write down what you were feeling. ?Once you have completed wearing your monitor, remove and place into box which has postage paid and place in your outgoing mailbox.  ?If for some reason you  have misplaced your box then call our office and we can provide another box and/or mail it off for you. ? ? ? ? ?Follow-Up: ?At [facility], you and your health needs are our priority.  As part of our continuing mission to provide you with exceptional heart care, we have created designated Provider Care Teams.  These Care Teams include your primary Cardiologist (physician) and Advanced Practice Providers (APPs -  Physician Assistants and Nurse Practitioners) who all work together to provide you with the care you need, when you need it. ? ?You will need a follow up appointment in 3 months ? ?Providers on your designated Care Team:   ?Murray Hodgkins, NP ?Christell Faith, PA-C ?Cadence Kathlen Mody, PA-C ? ?COVID-19 Vaccine Information can be found at: [email or web address].co.uk For questions related to vaccine distribution or appointments, please email [email or web address] or call [phone].  ? ?

## 2022-02-10 ENCOUNTER — Encounter: Payer: Self-pay | Admitting: Internal Medicine

## 2022-02-10 NOTE — Assessment & Plan Note (Signed)
Being followed by psychiatry.  On cymbalta.  Increased depression related to recent passing of her husband.  Discussed.  No SI.  Follow.  ?

## 2022-02-10 NOTE — Assessment & Plan Note (Signed)
Found to have afib.  Has been on eliquis.  No increased heart rate or palpitations.  Follow. Have discussed the need to continue eliquis.  States she cannot afford.  Working with pharmacist.  Samples provided.  She will contact cardiology as well.  Discussed with pharmacy regarding requirements for coverage.  ?

## 2022-02-10 NOTE — Assessment & Plan Note (Signed)
No upper symptoms reported.  On protonix.   

## 2022-02-10 NOTE — Assessment & Plan Note (Signed)
Breztri.  Breathing stable.  

## 2022-02-10 NOTE — Assessment & Plan Note (Signed)
Continue lipitor  ?

## 2022-02-10 NOTE — Assessment & Plan Note (Signed)
S/p stent placement.  Continue lipitor. Followed by cardiology.  On eliquis.  Needs to continue as outlined.  Discussed.  ?

## 2022-02-10 NOTE — Assessment & Plan Note (Signed)
CPAP.  

## 2022-02-10 NOTE — Assessment & Plan Note (Signed)
Continue lipitor.  Continue eliquis.   

## 2022-02-10 NOTE — Assessment & Plan Note (Signed)
On crestor.  Low cholesterol diet and exercise.  Follow lipid panel and liver function tests.   

## 2022-02-10 NOTE — Assessment & Plan Note (Signed)
Continue micardis '80mg'$  q day.  Also on amlodipine '5mg'$  q day.  Pressures elevated as outlined.  Increase amlodipine to '10mg'$  q day.  Follow pressures.  Follow metabolic panel.  ?

## 2022-02-10 NOTE — Assessment & Plan Note (Signed)
On ozempic.  Tolerating.  Blood sugars as outlined.  Low carb diet and exercise.  Follow met b and a1c.  

## 2022-02-10 NOTE — Assessment & Plan Note (Signed)
Have discussed the need to quit smoking.  Declines to quit at this time.  Follow.  ?

## 2022-02-11 ENCOUNTER — Other Ambulatory Visit: Payer: Self-pay | Admitting: *Deleted

## 2022-02-11 DIAGNOSIS — R55 Syncope and collapse: Secondary | ICD-10-CM

## 2022-02-11 DIAGNOSIS — F17210 Nicotine dependence, cigarettes, uncomplicated: Secondary | ICD-10-CM

## 2022-02-11 DIAGNOSIS — Z87891 Personal history of nicotine dependence: Secondary | ICD-10-CM

## 2022-02-11 NOTE — Telephone Encounter (Signed)
Called [facility] to confirm that they have the prescription for amlodipine 10 mg. Cancelled all prescriptions for the 5 mg. Patient is aware. ?

## 2022-02-11 NOTE — Telephone Encounter (Signed)
Pt called in stating that her pharmacy called her and advised her that Dr. Nicki Reaper sent over two of the same script for medication (amLODipine (NORVASC) 10 MG tablet)... Pt stated that the pharmacy advise that one script stated for '5mg'$  and the other is for '10mg'$  for medication (amLODipine (NORVASC) 10 MG tablet)... Pt stated that Dr. Nicki Reaper increased her dosage to '10mg'$ ...  Pt is requesting for Dr. Nicki Reaper to send over the correct script to pharmacy... Pt is requesting callback...  ?

## 2022-02-12 ENCOUNTER — Telehealth: Payer: Self-pay | Admitting: Cardiovascular Disease

## 2022-02-12 NOTE — Telephone Encounter (Signed)
? ?  Cardiac Monitor Alert ? ?Date of alert:  02/12/2022  ? ?Patient Name: Kathryn Friedman  ?DOB: 1956/04/06  ?MRN: [id number]  ? ?[facility] Cardiologist: Dr. Rockey Situ ?[facility] EP:  None   ? ?Monitor Information: ?Long Term Monitor-Live Telemetry [ZioAT]  ?Reason:  Near syncope  ?Ordering provider:  Dr. Rockey Situ ?  ?Alert ?Atrial Fibrillation/Flutter ?This is the 1st alert for this rhythm.  ?The patient has a hx of Atrial Fibrillation/Flutter.  ?The patient is not currently on anticoagulation. ?Anticoagulation medication as of 02/12/2022   ? ?    ?  ? apixaban (ELIQUIS) 5 MG TABS tablet Take 1 tablet (5 mg total) by mouth 2 (two) times daily.  ? ?  ? ? ?Next Cardiology Appointment   ?Date:  05/13/22  Provider:  Dr. Rockey Situ ? ?The patient was contacted today.  She is asymptomatic. Pt reports intermittent shortness of breath. Monitor ordered for near syncope. ?Arrhythmia, symptoms and history reviewed with Dr. Rockey Situ.  Plan:  Pt has known paroxysmal a fib. Plan to continue to wear live monitor to assess causes for near syncope and follow up as scheduled.    ? ? ?Darlyne Russian, RN  ?02/12/2022 3:36 PM  ? ?

## 2022-02-12 NOTE — Telephone Encounter (Signed)
[facility] calling with abnormal EKG reading ?

## 2022-02-13 DIAGNOSIS — R55 Syncope and collapse: Secondary | ICD-10-CM | POA: Diagnosis not present

## 2022-02-18 ENCOUNTER — Telehealth: Payer: Self-pay

## 2022-02-18 ENCOUNTER — Other Ambulatory Visit: Payer: Self-pay | Admitting: Psychiatry

## 2022-02-18 DIAGNOSIS — F330 Major depressive disorder, recurrent, mild: Secondary | ICD-10-CM

## 2022-02-18 MED ORDER — DULOXETINE HCL 60 MG PO CPEP: 60.0000 mg | ORAL_CAPSULE | Freq: Every day | ORAL | 1 refills | Status: DC

## 2022-02-18 NOTE — Telephone Encounter (Signed)
I have sent duloxetine to pharmacy. ?

## 2022-02-18 NOTE — Telephone Encounter (Signed)
received fax requesting a refill on the duloxetine  ?

## 2022-02-20 ENCOUNTER — Other Ambulatory Visit: Payer: Self-pay | Admitting: Internal Medicine

## 2022-02-20 DIAGNOSIS — Z1231 Encounter for screening mammogram for malignant neoplasm of breast: Secondary | ICD-10-CM

## 2022-02-21 ENCOUNTER — Other Ambulatory Visit: Payer: Self-pay | Admitting: Surgery

## 2022-02-21 ENCOUNTER — Ambulatory Visit: Payer: Medicare HMO

## 2022-02-21 ENCOUNTER — Other Ambulatory Visit: Payer: Medicare HMO

## 2022-02-21 ENCOUNTER — Other Ambulatory Visit (HOSPITAL_COMMUNITY): Payer: Self-pay | Admitting: Surgery

## 2022-02-21 DIAGNOSIS — S22040A Wedge compression fracture of fourth thoracic vertebra, initial encounter for closed fracture: Secondary | ICD-10-CM

## 2022-02-22 ENCOUNTER — Other Ambulatory Visit: Payer: Self-pay

## 2022-02-22 ENCOUNTER — Ambulatory Visit
Admission: RE | Admit: 2022-02-22 | Discharge: 2022-02-22 | Disposition: A | Discharge status: Home / Self Care | Payer: Medicare HMO | Source: Ambulatory Visit | Attending: Acute Care | Admitting: Acute Care

## 2022-02-22 DIAGNOSIS — Z87891 Personal history of nicotine dependence: Secondary | ICD-10-CM | POA: Diagnosis not present

## 2022-02-22 DIAGNOSIS — M2578 Osteophyte, vertebrae: Secondary | ICD-10-CM | POA: Diagnosis not present

## 2022-02-22 DIAGNOSIS — S22040A Wedge compression fracture of fourth thoracic vertebra, initial encounter for closed fracture: Secondary | ICD-10-CM | POA: Insufficient documentation

## 2022-02-22 DIAGNOSIS — M40204 Unspecified kyphosis, thoracic region: Secondary | ICD-10-CM | POA: Diagnosis not present

## 2022-02-22 DIAGNOSIS — F17210 Nicotine dependence, cigarettes, uncomplicated: Secondary | ICD-10-CM | POA: Insufficient documentation

## 2022-02-27 ENCOUNTER — Telehealth: Payer: Self-pay | Admitting: Internal Medicine

## 2022-02-27 NOTE — Telephone Encounter (Signed)
Pt came into office to drop off dental form to fill out. Placed in color folder up front ?

## 2022-02-28 ENCOUNTER — Telehealth: Payer: Self-pay

## 2022-02-28 NOTE — Telephone Encounter (Signed)
Patient scheduled for pre op 4/11. Procedure on 4/19. Advised could do fasting labs while her for appt but also advised that she needs to call Dr Gwenyth Ober office for clearance to have her surgery. Pt gave verbal understanding. Pre op form placed with accordion folder for appt. ?

## 2022-02-28 NOTE — Telephone Encounter (Signed)
Pt called in stating that Dr. Rockey Situ office is request for you to call there office...  ?

## 2022-02-28 NOTE — Telephone Encounter (Signed)
Patient called stating she is having "accidents" on gemtesa now. She has tried myrbetriq and oxybutynin. Does she need a follow up, or is there something else we can call in for her? ?

## 2022-03-01 ENCOUNTER — Ambulatory Visit: Payer: Self-pay | Admitting: Urology

## 2022-03-01 ENCOUNTER — Other Ambulatory Visit: Payer: Self-pay | Admitting: *Deleted

## 2022-03-01 ENCOUNTER — Encounter: Payer: Self-pay | Admitting: Cardiovascular Disease

## 2022-03-01 ENCOUNTER — Encounter: Payer: Self-pay | Admitting: Internal Medicine

## 2022-03-01 MED ORDER — ISOSORBIDE MONONITRATE ER 30 MG PO TB24: 30.0000 mg | ORAL_TABLET | Freq: Every day | ORAL | 0 refills | Status: DC

## 2022-03-01 MED ORDER — AMLODIPINE BESYLATE 10 MG PO TABS: 10.0000 mg | ORAL_TABLET | Freq: Every day | ORAL | 0 refills | Status: DC

## 2022-03-01 MED ORDER — ROSUVASTATIN CALCIUM 20 MG PO TABS: 20.0000 mg | ORAL_TABLET | Freq: Every day | ORAL | 0 refills | Status: DC

## 2022-03-01 MED ORDER — PROPRANOLOL HCL ER 60 MG PO CP24: 60.0000 mg | ORAL_CAPSULE | Freq: Every day | ORAL | 0 refills | Status: DC

## 2022-03-04 ENCOUNTER — Encounter: Payer: Self-pay | Admitting: Internal Medicine

## 2022-03-04 DIAGNOSIS — E1142 Type 2 diabetes mellitus with diabetic polyneuropathy: Secondary | ICD-10-CM | POA: Diagnosis not present

## 2022-03-05 ENCOUNTER — Encounter: Payer: Self-pay | Admitting: Internal Medicine

## 2022-03-05 ENCOUNTER — Other Ambulatory Visit: Payer: Medicare HMO

## 2022-03-05 ENCOUNTER — Ambulatory Visit (INDEPENDENT_AMBULATORY_CARE_PROVIDER_SITE_OTHER): Payer: Medicare Other | Admitting: Internal Medicine

## 2022-03-05 DIAGNOSIS — I700 Atherosclerosis of aorta: Secondary | ICD-10-CM

## 2022-03-05 DIAGNOSIS — K219 Gastro-esophageal reflux disease without esophagitis: Secondary | ICD-10-CM | POA: Diagnosis not present

## 2022-03-05 DIAGNOSIS — I2510 Atherosclerotic heart disease of native coronary artery without angina pectoris: Secondary | ICD-10-CM | POA: Diagnosis not present

## 2022-03-05 DIAGNOSIS — I471 Supraventricular tachycardia: Secondary | ICD-10-CM

## 2022-03-05 DIAGNOSIS — F331 Major depressive disorder, recurrent, moderate: Secondary | ICD-10-CM

## 2022-03-05 DIAGNOSIS — I779 Disorder of arteries and arterioles, unspecified: Secondary | ICD-10-CM

## 2022-03-05 DIAGNOSIS — J849 Interstitial pulmonary disease, unspecified: Secondary | ICD-10-CM | POA: Diagnosis not present

## 2022-03-05 DIAGNOSIS — I480 Paroxysmal atrial fibrillation: Secondary | ICD-10-CM | POA: Diagnosis not present

## 2022-03-05 DIAGNOSIS — G4733 Obstructive sleep apnea (adult) (pediatric): Secondary | ICD-10-CM

## 2022-03-05 DIAGNOSIS — Z01818 Encounter for other preprocedural examination: Secondary | ICD-10-CM

## 2022-03-05 DIAGNOSIS — J449 Chronic obstructive pulmonary disease, unspecified: Secondary | ICD-10-CM | POA: Diagnosis not present

## 2022-03-05 DIAGNOSIS — I10 Essential (primary) hypertension: Secondary | ICD-10-CM

## 2022-03-05 DIAGNOSIS — E1159 Type 2 diabetes mellitus with other circulatory complications: Secondary | ICD-10-CM | POA: Diagnosis not present

## 2022-03-05 DIAGNOSIS — E7800 Pure hypercholesterolemia, unspecified: Secondary | ICD-10-CM | POA: Diagnosis not present

## 2022-03-05 NOTE — Progress Notes (Signed)
Patient ID: Kathryn Friedman, female   DOB: Aug 12, 1956, 66 y.o.   MRN: [id number] ? ? ?Subjective:  ? ? Patient ID: Kathryn Friedman, female    DOB: [birth year], 66 y.o.   MRN: [id number] ? ?This visit occurred during the SARS-CoV-2 public health emergency.  Safety protocols were in place, including screening questions prior to the visit, additional usage of staff PPE, and extensive cleaning of exam room while observing appropriate contact time as indicated for disinfecting solutions.  ? ?Patient here for work in appt.  ? ?Chief Complaint  ?Patient presents with  ? Pre-op Exam  ?  Pre-op exam for dental procedure  ? .  ? ?HPI ?Planning to have dental procedure 03/13/22 - lower teeth removed with bone smoothing.  Apparently had upper teeth removed a couple of years ago.  She is on eliquis for afib.  Sees Dr Rockey Situ.  Recently evaluated for question of syncopal episode.  Wore zio monitor.  Denies any further syncope or near syncope.  Denies chest pain.  Reports breathing is stable.  No increased sob.  No increased cough or congestion.  States she had a couple of episodes over the last several weeks where had to catch her breath, but relates this to allergy and weather change.  No chest pain or increased sob with increased activity or exertion.  Eating.  No bowel change reported.  Using breztri bid.  Has rescue inhaler if needed.  May use 1-2 times per day - pending weather and allergies.  Sees Dr Raul Del.  Increased stress recently.  Still trying to cope with husband's death.  Seeing Dr Shea Evans.   ? ? ?Past Medical History:  ?Diagnosis Date  ? Anemia   ? Anginal pain ([place])   ? Anxiety   ? Arthritis   ? back and knees  ? Asthma   ? Bilateral carotid artery stenosis   ? Blood in stool   ? Chronic diarrhea   ? COPD (chronic obstructive pulmonary disease) ([place])   ? Coronary artery disease   ? a.) 75% pRCA; 3.5 x 28 mm Cypher DES placed on 07/24/2006  ? Current use of long term anticoagulation   ? Clopidogrel  ?  Depression   ? secondary to the death of her husband (died 57)  ? Diverticulitis   ? Diverticulosis   ? Dizzinesses   ? Dysphagia   ? Dyspnea   ? Fatty infiltration of liver   ? GERD (gastroesophageal reflux disease)   ? Headache   ? History of 2019 novel coronavirus disease (COVID-19) 12/09/2020  ? History of 2019 novel coronavirus disease (COVID-19) 12/20/2020  ? Hypertension   ? Hypertriglyceridemia   ? ILD (interstitial lung disease) ([place])   ? Lump in the abdomen   ? OSA on CPAP   ? Overactive bladder   ? PSVT (paroxysmal supraventricular tachycardia) ([place])   ? Spastic colon   ? T2DM (type 2 diabetes mellitus) ([place]) 05/2008  ? Tobacco abuse   ? Venous insufficiency of both lower extremities   ? ?Past Surgical History:  ?Procedure Laterality Date  ? ABDOMINAL HYSTERECTOMY  with left ovary in place 1996  ? APPENDECTOMY  1985  ? BREAST BIOPSY Left 10/14/2017  ? calcs bx, fibrosis giant cell reaction and chronic inflammation, negative for malignancy.   ? CATARACT EXTRACTION, BILATERAL    ? [place]  ? CHOLECYSTECTOMY  1985  ? COLONOSCOPY WITH PROPOFOL N/A 09/13/2016  ? Procedure: COLONOSCOPY WITH PROPOFOL;  Surgeon: Gavin Pound  Vira Agar, MD;  Location: [facility];  Service: Endoscopy;  Laterality: N/A;  ? COLONOSCOPY WITH PROPOFOL N/A 11/09/2018  ? Procedure: COLONOSCOPY WITH PROPOFOL;  Surgeon: Manya Silvas, MD;  Location: [facility] ENDOSCOPY;  Service: Endoscopy;  Laterality: N/A;  ? COLONOSCOPY WITH PROPOFOL N/A 03/29/2020  ? Procedure: COLONOSCOPY WITH PROPOFOL;  Surgeon: Robert Bellow, MD;  Location: [facility] ENDOSCOPY;  Service: Endoscopy;  Laterality: N/A;  ? CORONARY ANGIOPLASTY WITH STENT PLACEMENT N/A 07/24/2006  ? 75% pRCA; 3.5 x 28 mm Cypher DES placed; Location: [facility]; Surgeons: Katrine Coho, MD  ? ESOPHAGOGASTRODUODENOSCOPY (EGD) WITH PROPOFOL N/A 02/02/2018  ? Procedure: ESOPHAGOGASTRODUODENOSCOPY (EGD) WITH PROPOFOL;  Surgeon: Manya Silvas, MD;  Location: [facility] ENDOSCOPY;   Service: Endoscopy;  Laterality: N/A;  ? ESOPHAGOGASTRODUODENOSCOPY (EGD) WITH PROPOFOL N/A 03/29/2020  ? Procedure: ESOPHAGOGASTRODUODENOSCOPY (EGD) WITH PROPOFOL;  Surgeon: Robert Bellow, MD;  Location: [facility] ENDOSCOPY;  Service: Endoscopy;  Laterality: N/A;  ? EYE SURGERY    ? JOINT REPLACEMENT    ? bilateral knee replacements  ? KNEE ARTHROSCOPY  Arthroscopic left knee surgery   ? KNEE SURGERY  status post knee surgey   ? LEFT HEART CATH AND CORONARY ANGIOGRAPHY Left 05/14/2018  ? Procedure: LEFT HEART CATH AND CORONARY ANGIOGRAPHY;  Surgeon: Corey Skains, MD;  Location: [facility] CV LAB;  Service: Cardiovascular;  Laterality: Left;  ? REPLACEMENT TOTAL KNEE  ([facility])  ? SHOULDER SURGERY  shoulder operation secondary to a torn tendon  ? TOTAL HIP ARTHROPLASTY Left 06/12/2021  ? Procedure: TOTAL HIP ARTHROPLASTY;  Surgeon: Corky Mull, MD;  Location: [facility] ORS;  Service: Orthopedics;  Laterality: Left;  ? ?Family History  ?Problem Relation Age of Onset  ? Other Mother   ?     Hit by a fire truck and has had multiple operations on her back , and has history of MVP   ? Mitral valve prolapse Mother   ? Lung cancer Mother   ? Depression Mother   ? Heart disease Father   ?     myocardial infarction and is status post bypass surgery  ? Mitral valve prolapse Sister   ? Bipolar disorder Sister   ? Hepatitis C Brother   ? Cirrhosis Brother   ? Colon cancer Paternal Aunt   ? Breast cancer Neg Hx   ? Prostate cancer Neg Hx   ? Bladder Cancer Neg Hx   ? Kidney cancer Neg Hx   ? ?Social History  ? ?Socioeconomic History  ? Marital status: Widowed  ?  Spouse name: Saisha Hogue  ? Number of children: 1  ? Years of education: 66  ? Highest education level: 12th grade  ?Occupational History  ?  Employer: [employer]  ?Tobacco Use  ? Smoking status: Every Day  ?  Packs/day: 2.00  ?  Years: 45.00  ?  Pack years: 90.00  ?  Types: Cigarettes  ?  Passive exposure: Current  ? Smokeless tobacco: Never  ? Tobacco comments:  ?  Patient  reported she is not ready to quit at this time due to recent loss of her husband.   ?Vaping Use  ? Vaping Use: Former  ?Substance and Sexual Activity  ? Alcohol use: No  ?  Alcohol/week: 0.0 standard drinks  ? Drug use: No  ? Sexual activity: Not Currently  ?Other Topics Concern  ? Not on file  ?Social History Narrative  ? Lives with husband  ? ?Social Determinants of Health  ? ?Financial Resource Strain: Medium  Risk  ? Difficulty of Paying Living Expenses: Somewhat hard  ?Food Insecurity: No Food Insecurity  ? Worried About Charity fundraiser in the Last Year: Never true  ? Ran Out of Food in the Last Year: Never true  ?Transportation Needs: No Transportation Needs  ? Lack of Transportation (Medical): No  ? Lack of Transportation (Non-Medical): No  ?Physical Activity: Inactive  ? Days of Exercise per Week: 0 days  ? Minutes of Exercise per Session: 0 min  ?Stress: No Stress Concern Present  ? Feeling of Stress : Only a little  ?Social Connections: Moderately Integrated  ? Frequency of Communication with Friends and Family: More than three times a week  ? Frequency of Social Gatherings with Friends and Family: More than three times a week  ? Attends Religious Services: 1 to 4 times per year  ? Active Member of Clubs or Organizations: No  ? Attends Archivist Meetings: Never  ? Marital Status: Married  ? ? ? ?Review of Systems  ?Constitutional:  Negative for appetite change and unexpected weight change.  ?HENT:  Negative for congestion and sinus pressure.   ?Respiratory:  Negative for cough and chest tightness.   ?     No increased sob.  Feels breathing is stable.   ?Cardiovascular:  Negative for chest pain, palpitations and leg swelling.  ?Gastrointestinal:  Negative for abdominal pain, diarrhea, nausea and vomiting.  ?Genitourinary:  Negative for difficulty urinating and dysuria.  ?Musculoskeletal:  Negative for joint swelling and myalgias.  ?Skin:  Negative for color change and rash.  ?Neurological:   Negative for dizziness, light-headedness and headaches.  ?Psychiatric/Behavioral:  Negative for agitation and dysphoric mood.   ? ?   ?Objective:  ?  ? ?BP 136/70 (BP Location: Left Arm, Patient Position: Sittin

## 2022-03-06 ENCOUNTER — Other Ambulatory Visit: Payer: Self-pay

## 2022-03-06 ENCOUNTER — Other Ambulatory Visit: Payer: Self-pay | Admitting: Cardiovascular Disease

## 2022-03-06 ENCOUNTER — Telehealth: Payer: Self-pay | Admitting: Internal Medicine

## 2022-03-06 ENCOUNTER — Encounter: Payer: Self-pay | Admitting: Internal Medicine

## 2022-03-06 ENCOUNTER — Telehealth: Payer: Self-pay | Admitting: Emergency Medicine

## 2022-03-06 DIAGNOSIS — Z01818 Encounter for other preprocedural examination: Secondary | ICD-10-CM | POA: Insufficient documentation

## 2022-03-06 LAB — LIPID PANEL
Cholesterol: 134 mg/dL (ref 0–200)
HDL: 46.3 mg/dL (ref >39.00)
LDL Cholesterol: 62 mg/dL (ref 0–99)
NonHDL: 87.28
Total CHOL/HDL Ratio: 3
Triglycerides: 126 mg/dL (ref 0.0–149.0)
VLDL: 25.2 mg/dL (ref 0.0–40.0)

## 2022-03-06 LAB — HEPATIC FUNCTION PANEL
ALT: 18 U/L (ref 0–35)
AST: 19 U/L (ref 0–37)
Albumin: 4 g/dL (ref 3.5–5.2)
Alkaline Phosphatase: 89 U/L (ref 39–117)
Bilirubin, Direct: 0.1 mg/dL (ref 0.0–0.3)
Total Bilirubin: 0.4 mg/dL (ref 0.2–1.2)
Total Protein: 6.8 g/dL (ref 6.0–8.3)

## 2022-03-06 LAB — BASIC METABOLIC PANEL
BUN: 9 mg/dL (ref 6–23)
CO2: 25 mEq/L (ref 19–32)
Calcium: 9.4 mg/dL (ref 8.4–10.5)
Chloride: 99 mEq/L (ref 96–112)
Creatinine, Ser: 0.73 mg/dL (ref 0.40–1.20)
GFR: 85.95 mL/min (ref >60.00)
Glucose, Bld: 145 mg/dL — ABNORMAL HIGH (ref 70–99)
Potassium: 4.2 mEq/L (ref 3.5–5.1)
Sodium: 134 mEq/L — ABNORMAL LOW (ref 135–145)

## 2022-03-06 LAB — HEMOGLOBIN A1C: Hgb A1c MFr Bld: 7.5 % — ABNORMAL HIGH (ref 4.6–6.5)

## 2022-03-06 MED ORDER — MUPIROCIN 2 % EX OINT: 1.0000 "application " | TOPICAL_OINTMENT | Freq: Two times a day (BID) | CUTANEOUS | 0 refills | Status: DC

## 2022-03-06 NOTE — Assessment & Plan Note (Signed)
Continue lipitor  ?

## 2022-03-06 NOTE — Telephone Encounter (Signed)
Patient called back and said to cancel medication refill request, she found her ointment. ?

## 2022-03-06 NOTE — Assessment & Plan Note (Signed)
Had sleep study last night.  Followed by pulmonary.  ?

## 2022-03-06 NOTE — Assessment & Plan Note (Signed)
Being followed by psychiatry.  On cymbalta.  Increased depression related to recent passing of her husband.  Discussed.   Follow.  ?

## 2022-03-06 NOTE — Assessment & Plan Note (Signed)
S/p stent placement.  Continue lipitor. Followed by cardiology.  On eliquis.  She was able to get pt assistance for eliquis.  Will need to hold for procedure.  She is aware of risk of stopping medication and agreeable to proceed with procedure.  D/w cardiology regarding cardiac recommendations prior to planned dental procedure.  ?

## 2022-03-06 NOTE — Assessment & Plan Note (Signed)
On ozempic.  Tolerating.  Blood sugars as outlined.  Low carb diet and exercise.  Follow met b and a1c.  

## 2022-03-06 NOTE — Telephone Encounter (Signed)
Patient walked into the bedroom door and scraped her arm. She can not find her mupirocin ointment (BACTROBAN) 2 %, could Dr Nicki Reaper call in for her please. ?

## 2022-03-06 NOTE — Assessment & Plan Note (Signed)
Continue lipitor.  Continue eliquis.   

## 2022-03-06 NOTE — Telephone Encounter (Signed)
-----   Message from Minna Merritts, MD sent at 03/06/2022 12:18 PM EDT ----- ?Event monitor ?Frequent episodes of atrial fibrillation noted, 3% burden ?Would recommend we try to suppress her A-fib ?Would suggest we increase her propranolol from 60 mg once a day up to 60 mg ER twice a day ? ? ?

## 2022-03-06 NOTE — Telephone Encounter (Signed)
Called patient. No answer. Lmtcb.  

## 2022-03-06 NOTE — Assessment & Plan Note (Signed)
Here for evaluation prior to dental procedure.  Will clarify with dentist regarding procedure.  Planning for extraction of lower teeth with bone smoothing.  Pt states they plan for local numbing.  Will clarify.  She denies any chest pain, increased heart rate or palpitations.  No syncope or near syncopal episodes. Feels breathing is stable. No increased sob, cough or congestion.  Will continue breztri bid.  Recently evaluated by cardiology as outlined.  Wore zio monitor.  Will d/w cardiology regarding recs prior to procedure.  Also, discuss holding eliquis as outlined. F/u with pulmonary regarding recommendations.  ?

## 2022-03-06 NOTE — Assessment & Plan Note (Signed)
Documented history.  Denies any increased heart rate or palpitations.  Recently evaluated by cardiology.  Wore zio monitor.  F/u with cardiology regarding results and further recommendations.  ?

## 2022-03-06 NOTE — Assessment & Plan Note (Signed)
No upper symptoms reported.  On protonix.   

## 2022-03-06 NOTE — Assessment & Plan Note (Signed)
Sees Dr Fleming.  Using breztri.  Breathing stable.  No increased sob, cough or congestion.  

## 2022-03-06 NOTE — Assessment & Plan Note (Signed)
Continue micardis '80mg'$  q day and amlodipine '10mg'$  q day.  Blood pressure doing well. Follow pressures.  Follow metabolic panel.  Will need close monitoring of heart rate and blood pressure - upcoming procedure.   ?

## 2022-03-06 NOTE — Assessment & Plan Note (Signed)
Breztri.  Breathing stable.  

## 2022-03-06 NOTE — Assessment & Plan Note (Signed)
Found to have afib.  Has been on eliquis.  No increased heart rate or palpitations.  Recently evaluated by cardiology as outlined - for possible syncopal episode.  Wore zio monitor.  Will f/u with cardiology regarding results and recommendations prior to procedure.  Pt aware of risk of holding eliquis.  Understands and is agreeable to proceed with procedure.   ?

## 2022-03-07 ENCOUNTER — Telehealth: Payer: Self-pay

## 2022-03-07 ENCOUNTER — Other Ambulatory Visit: Payer: Self-pay

## 2022-03-07 MED ORDER — PROPRANOLOL HCL ER 60 MG PO CP24: 60.0000 mg | ORAL_CAPSULE | Freq: Two times a day (BID) | ORAL | 2 refills | Status: DC

## 2022-03-07 NOTE — Addendum Note (Signed)
Addended by: Lamar Laundry on: 03/07/2022 11:30 AM ? ? Modules accepted: Orders ? ?

## 2022-03-07 NOTE — Telephone Encounter (Signed)
Ozempic P.A shipment arrived. ?Pt notified ?

## 2022-03-07 NOTE — Telephone Encounter (Signed)
Patient made aware of monitor results and Dr. Donivan Scull recommendation with verbalized understanding. ?She will increase propanolol to 60 mg twice a day. ?[facility] ? ? ?

## 2022-03-07 NOTE — Telephone Encounter (Signed)
Pt is returning call.  

## 2022-03-07 NOTE — Chronic Care Management (AMB) (Signed)
?  Chronic Care Management  ? ?Note ? ?03/07/2022 ?Name: Kathryn Friedman MRN: [id number] DOB: 1956-08-14 ? ?Kathryn Friedman is a 66 y.o. year old female who is a primary care patient of Einar Pheasant, MD. Kathryn Friedman is currently enrolled in care management services. An additional referral for RNCM  was placed.  ? ?Follow up plan: ?Telephone appointment with care management team member scheduled for:04/29/2022 ? ?Noreene Larsson, RMA ?Care Guide, Embedded Care Coordination ?[facility]  ?[place], [place] [address] ?Direct Dial: [phone] ?Museum/[email or web address] ?[email or web address]: [email or web address]  ? ?

## 2022-03-08 ENCOUNTER — Telehealth (INDEPENDENT_AMBULATORY_CARE_PROVIDER_SITE_OTHER): Payer: Medicare Other | Admitting: Psychiatry

## 2022-03-08 ENCOUNTER — Encounter: Payer: Self-pay | Admitting: Psychiatry

## 2022-03-08 ENCOUNTER — Telehealth: Payer: Self-pay

## 2022-03-08 DIAGNOSIS — F17200 Nicotine dependence, unspecified, uncomplicated: Secondary | ICD-10-CM

## 2022-03-08 DIAGNOSIS — F418 Other specified anxiety disorders: Secondary | ICD-10-CM | POA: Diagnosis not present

## 2022-03-08 DIAGNOSIS — Z634 Disappearance and death of family member: Secondary | ICD-10-CM | POA: Diagnosis not present

## 2022-03-08 DIAGNOSIS — G4701 Insomnia due to medical condition: Secondary | ICD-10-CM

## 2022-03-08 DIAGNOSIS — F330 Major depressive disorder, recurrent, mild: Secondary | ICD-10-CM | POA: Diagnosis not present

## 2022-03-08 DIAGNOSIS — G2581 Restless legs syndrome: Secondary | ICD-10-CM | POA: Insufficient documentation

## 2022-03-08 MED ORDER — ROPINIROLE HCL 0.25 MG PO TABS: 0.2500 mg | ORAL_TABLET | Freq: Every day | ORAL | 1 refills | Status: DC

## 2022-03-08 NOTE — Progress Notes (Signed)
Virtual Visit via Video Note ? ?I connected with Kathryn Friedman on 03/08/22 at 10:30 AM EDT by a video enabled telemedicine application and verified that I am speaking with the correct person using two identifiers. ? ?Location ?Provider Location : [facility] ?Patient Location : Home ? ?Participants: Patient , Provider ? ?  ?I discussed the limitations of evaluation and management by telemedicine and the availability of in person appointments. The patient expressed understanding and agreed to proceed. ?  ?I discussed the assessment and treatment plan with the patient. The patient was provided an opportunity to ask questions and all were answered. The patient agreed with the plan and demonstrated an understanding of the instructions. ?  ?The patient was advised to call back or seek an in-person evaluation if the symptoms worsen or if the condition fails to improve as anticipated. ? ? ?[place] MD OP Progress Note ? ?03/08/2022 1:00 PM ?Kathryn Friedman  ?MRN:  [id number] ? ?Chief Complaint:  ?Chief Complaint  ?Patient presents with  ? Follow-up: 66 year old Caucasian female with history of anxiety disorder, insomnia, bereavement, recently lost her husband, presented for medication management.  ? ?HPI: Kathryn Friedman is a 66 year old Caucasian female, widowed, retired, on [employer], lives in [place], has a history of insomnia, anxiety disorder, bereavement, tobacco use disorder, sleep apnea on CPAP, coronary artery disease status post stent placement, COPD, diabetes mellitus, GERD, interstitial lung disease, recent eye surgery, iron deficiency was evaluated by telemedicine today. ? ?Patient today reports that she continues to grieve the loss of her husband who passed away on 2022/12/06.  Patient reports today being her birthday she has been thinking about him a lot.  She however reports she is getting better with her grief.  She does have good support system from her son.  She is planning to have dinner with  him this weekend. ? ?Patient reports she struggles with sleep.  Sleep continues to be interrupted throughout the night.  She feels restlessness of her lower extremities throughout the night which likely could be keeping her awake.  Patient likely with restless leg symptoms, does not believe gabapentin is beneficial.  Agreeable to medication change.  Has not tried Requip, agreeable with a trial. ? ?Continues to be compliant on the Lunesta.  Denies side effects. ? ?Reports she is not interested in psychotherapy sessions at this time. ? ?Does have psychosocial stressors of relationship struggles with her late husband's sons.  She however is coping okay. ? ?Denies any suicidality, homicidality or perceptual disturbances. ? ?Patient denies any other concerns today. ? ?Visit Diagnosis:  ?  ICD-10-CM   ?1. MDD (major depressive disorder), recurrent episode, mild ([place])  F33.0   ?  ?2. Insomnia due to medical condition  G47.01 rOPINIRole (REQUIP) 0.25 MG tablet  ? Grief  ?  ?3. Other specified anxiety disorders  F41.8   ? Limited symptom attack  ?  ?4. Bereavement  Z63.4   ?  ?5. Tobacco use disorder  F17.200   ?  ?6. Restless leg syndrome  G25.81   ?  ? ? ?Past Psychiatric History: Reviewed past psychiatric history from progress note on 06/26/2020.  Past trials of trazodone, Cymbalta, Lexapro, Rexulti, doxepin, gabapentin. ?Patient was previously under the care of [facility]. ? ?Past Medical History:  ?Past Medical History:  ?Diagnosis Date  ? Anemia   ? Anginal pain ([place])   ? Anxiety   ? Arthritis   ? back and knees  ? Asthma   ? Bilateral  carotid artery stenosis   ? Blood in stool   ? Chronic diarrhea   ? COPD (chronic obstructive pulmonary disease) ([place])   ? Coronary artery disease   ? a.) 75% pRCA; 3.5 x 28 mm Cypher DES placed on 07/24/2006  ? Current use of long term anticoagulation   ? Clopidogrel  ? Depression   ? secondary to the death of her husband (died 23)  ? Diverticulitis   ? Diverticulosis   ?  Dizzinesses   ? Dysphagia   ? Dyspnea   ? Fatty infiltration of liver   ? GERD (gastroesophageal reflux disease)   ? Headache   ? History of 2019 novel coronavirus disease (COVID-19) 12/09/2020  ? History of 2019 novel coronavirus disease (COVID-19) 12/20/2020  ? Hypertension   ? Hypertriglyceridemia   ? ILD (interstitial lung disease) ([place])   ? Lump in the abdomen   ? OSA on CPAP   ? Overactive bladder   ? PSVT (paroxysmal supraventricular tachycardia) ([place])   ? Spastic colon   ? T2DM (type 2 diabetes mellitus) ([place]) 05/2008  ? Tobacco abuse   ? Venous insufficiency of both lower extremities   ?  ?Past Surgical History:  ?Procedure Laterality Date  ? ABDOMINAL HYSTERECTOMY  with left ovary in place 1996  ? APPENDECTOMY  1985  ? BREAST BIOPSY Left 10/14/2017  ? calcs bx, fibrosis giant cell reaction and chronic inflammation, negative for malignancy.   ? CATARACT EXTRACTION, BILATERAL    ? [place]  ? CHOLECYSTECTOMY  1985  ? COLONOSCOPY WITH PROPOFOL N/A 09/13/2016  ? Procedure: COLONOSCOPY WITH PROPOFOL;  Surgeon: Manya Silvas, MD;  Location: [facility] ENDOSCOPY;  Service: Endoscopy;  Laterality: N/A;  ? COLONOSCOPY WITH PROPOFOL N/A 11/09/2018  ? Procedure: COLONOSCOPY WITH PROPOFOL;  Surgeon: Manya Silvas, MD;  Location: [facility] ENDOSCOPY;  Service: Endoscopy;  Laterality: N/A;  ? COLONOSCOPY WITH PROPOFOL N/A 03/29/2020  ? Procedure: COLONOSCOPY WITH PROPOFOL;  Surgeon: Robert Bellow, MD;  Location: [facility] ENDOSCOPY;  Service: Endoscopy;  Laterality: N/A;  ? CORONARY ANGIOPLASTY WITH STENT PLACEMENT N/A 07/24/2006  ? 75% pRCA; 3.5 x 28 mm Cypher DES placed; Location: [facility]; Surgeons: Katrine Coho, MD  ? ESOPHAGOGASTRODUODENOSCOPY (EGD) WITH PROPOFOL N/A 02/02/2018  ? Procedure: ESOPHAGOGASTRODUODENOSCOPY (EGD) WITH PROPOFOL;  Surgeon: Manya Silvas, MD;  Location: [facility] ENDOSCOPY;  Service: Endoscopy;  Laterality: N/A;  ? ESOPHAGOGASTRODUODENOSCOPY (EGD) WITH PROPOFOL N/A 03/29/2020  ?  Procedure: ESOPHAGOGASTRODUODENOSCOPY (EGD) WITH PROPOFOL;  Surgeon: Robert Bellow, MD;  Location: [facility] ENDOSCOPY;  Service: Endoscopy;  Laterality: N/A;  ? EYE SURGERY    ? JOINT REPLACEMENT    ? bilateral knee replacements  ? KNEE ARTHROSCOPY  Arthroscopic left knee surgery   ? KNEE SURGERY  status post knee surgey   ? LEFT HEART CATH AND CORONARY ANGIOGRAPHY Left 05/14/2018  ? Procedure: LEFT HEART CATH AND CORONARY ANGIOGRAPHY;  Surgeon: Corey Skains, MD;  Location: [place] [facility] CV LAB;  Service: Cardiovascular;  Laterality: Left;  ? REPLACEMENT TOTAL KNEE  ([facility])  ? SHOULDER SURGERY  shoulder operation secondary to a torn tendon  ? TOTAL HIP ARTHROPLASTY Left 06/12/2021  ? Procedure: TOTAL HIP ARTHROPLASTY;  Surgeon: Corky Mull, MD;  Location: [facility] ORS;  Service: Orthopedics;  Laterality: Left;  ? ? ?Family Psychiatric History: Reviewed family psychiatric history from progress note on 06/26/2020. ? ?Family History:  ?Family History  ?Problem Relation Age of Onset  ? Other Mother   ?  Hit by a fire truck and has had multiple operations on her back , and has history of MVP   ? Mitral valve prolapse Mother   ? Lung cancer Mother   ? Depression Mother   ? Heart disease Father   ?     myocardial infarction and is status post bypass surgery  ? Mitral valve prolapse Sister   ? Bipolar disorder Sister   ? Hepatitis C Brother   ? Cirrhosis Brother   ? Colon cancer Paternal Aunt   ? Breast cancer Neg Hx   ? Prostate cancer Neg Hx   ? Bladder Cancer Neg Hx   ? Kidney cancer Neg Hx   ? ? ?Social History: Reviewed social history from progress note on 06/26/2020. ?Social History  ? ?Socioeconomic History  ? Marital status: Widowed  ?  Spouse name: Cynia Abruzzo  ? Number of children: 1  ? Years of education: 49  ? Highest education level: 12th grade  ?Occupational History  ?  Employer: [employer]  ?Tobacco Use  ? Smoking status: Every Day  ?  Packs/day: 2.00  ?  Years: 45.00  ?  Pack years: 90.00  ?  Types: Cigarettes  ?   Passive exposure: Current  ? Smokeless tobacco: Never  ? Tobacco comments:  ?  Patient reported she is not ready to quit at this time due to recent loss of her husband.   ?Vaping Use  ? Vaping Use: Former

## 2022-03-08 NOTE — Telephone Encounter (Signed)
MYCHART MSG SENT FOR LABS ?

## 2022-03-11 ENCOUNTER — Ambulatory Visit (INDEPENDENT_AMBULATORY_CARE_PROVIDER_SITE_OTHER): Payer: Medicare Other | Admitting: Urology

## 2022-03-11 ENCOUNTER — Telehealth: Payer: Self-pay

## 2022-03-11 VITALS — BP 133/73 | HR 83

## 2022-03-11 DIAGNOSIS — N3946 Mixed incontinence: Secondary | ICD-10-CM | POA: Diagnosis not present

## 2022-03-11 DIAGNOSIS — G4733 Obstructive sleep apnea (adult) (pediatric): Secondary | ICD-10-CM | POA: Diagnosis not present

## 2022-03-11 MED ORDER — TRIMETHOPRIM 100 MG PO TABS: 100.0000 mg | ORAL_TABLET | Freq: Every day | ORAL | 3 refills | Status: DC

## 2022-03-11 NOTE — Telephone Encounter (Signed)
Pt arrived to office to retrieve patient assistance meds Ozempic. Meds handed to pt at front desk. ? ?Pt identified using 2 identifiers.  ?

## 2022-03-11 NOTE — Telephone Encounter (Signed)
Discussed with patient to give the medication more time since she just started it couple of days ago.  Patient advised to call back in a week or 2 if medication does not help at all. ?

## 2022-03-11 NOTE — Telephone Encounter (Signed)
pt called states that the medication for the restless legs is not working . needs to be increase or giving something else.  ?
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
## 2022-03-12 LAB — MICROSCOPIC EXAMINATION
Bacteria, UA: NONE SEEN
Epithelial Cells (non renal): NONE SEEN /hpf (ref 0–10)
RBC, Urine: NONE SEEN /hpf (ref 0–2)

## 2022-03-12 LAB — URINALYSIS, COMPLETE
Bilirubin, UA: NEGATIVE
Glucose, UA: NEGATIVE
Ketones, UA: NEGATIVE
Leukocytes,UA: NEGATIVE
Nitrite, UA: NEGATIVE
Protein,UA: NEGATIVE
RBC, UA: NEGATIVE
Specific Gravity, UA: 1.02 (ref 1.005–1.030)
Urobilinogen, Ur: 0.2 mg/dL (ref 0.2–1.0)
pH, UA: 6 (ref 5.0–7.5)

## 2022-03-13 ENCOUNTER — Encounter: Payer: Self-pay | Admitting: Internal Medicine

## 2022-03-14 ENCOUNTER — Telehealth: Payer: Self-pay

## 2022-03-14 DIAGNOSIS — Z955 Presence of coronary angioplasty implant and graft: Secondary | ICD-10-CM | POA: Insufficient documentation

## 2022-03-14 DIAGNOSIS — E669 Obesity, unspecified: Secondary | ICD-10-CM | POA: Insufficient documentation

## 2022-03-14 DIAGNOSIS — N3281 Overactive bladder: Secondary | ICD-10-CM | POA: Insufficient documentation

## 2022-03-14 LAB — CULTURE, URINE COMPREHENSIVE

## 2022-03-14 NOTE — Telephone Encounter (Signed)
Patient called insurance as advised and found the company is a scam. ?

## 2022-03-14 NOTE — Telephone Encounter (Signed)
Patient urine culture is back. Patient is currently on Amoxicillin 500 mg TID for 10 days for tooth extraction ?

## 2022-03-14 NOTE — Telephone Encounter (Signed)
Spoke with patient advised her I did check BBB and company looks okay , but to be on safe side she should call her insurance company and make sure they would approve this company and not to except any supplies until she checked and made sure her insurance would pay and they approved this company on [employer] this company doe shave A+ rating. ?

## 2022-03-14 NOTE — Telephone Encounter (Signed)
Please call pt and let her know that I am not familiar with this company and I have not ordered.  Need to confirm this is legitimate.   ?

## 2022-03-15 ENCOUNTER — Telehealth: Payer: Self-pay

## 2022-03-15 NOTE — Telephone Encounter (Signed)
-----   Message from Alvera Novel, [place] sent at 03/11/2022 10:17 AM EDT ----- ?Regarding: PTNS ?Dr.MacDiarmid would like to start pt on PTNS ? ?

## 2022-03-15 NOTE — Telephone Encounter (Signed)
Pt has [employer], no PA required. Pt scheduled for 12 weekly PTNS visits, pt confirmed.  ?

## 2022-03-18 ENCOUNTER — Telehealth: Payer: Self-pay | Admitting: *Deleted

## 2022-03-18 NOTE — Telephone Encounter (Addendum)
Left Vm asked to return all ? ?----- Message from Bjorn Loser, MD sent at 03/18/2022  8:18 AM EDT ----- ?Macrodantin 100 mg twice a day for 7 days ?Then go back on daily suppressive therapy ?----- Message ----- ?From: Alvera Novel, CMA ?Sent: 03/14/2022   8:20 AM EDT ?To: Bjorn Loser, MD ? ? ?----- Message ----- ?From: Interface, [facility] Lab Results In ?Sent: 03/12/2022   5:37 AM EDT ?To: Rowe Robert Clinical ? ? ? ?

## 2022-03-18 NOTE — Telephone Encounter (Signed)
Pt calls triage line, states she is returning call. Informed pt of the infromation below. She voiced understanding, however she states she is currently taking amoxicillin '100mg'$  TID from the dentist. She wants to know if she should begin Macrodantin while on Amoxicillin. She has 7 days of Amoxicillin left. Please advise.  ?

## 2022-03-19 NOTE — Telephone Encounter (Signed)
Called pt no answer. LM for pt informing her that she will need to complete both antibiotics per Dr. Matilde Sprang.  ?

## 2022-03-20 ENCOUNTER — Telehealth: Payer: Self-pay

## 2022-03-20 ENCOUNTER — Encounter: Payer: Self-pay | Admitting: Internal Medicine

## 2022-03-20 DIAGNOSIS — G2581 Restless legs syndrome: Secondary | ICD-10-CM

## 2022-03-20 MED ORDER — NITROFURANTOIN MACROCRYSTAL 100 MG PO CAPS: 100.0000 mg | ORAL_CAPSULE | Freq: Two times a day (BID) | ORAL | 0 refills | Status: DC

## 2022-03-20 MED ORDER — ROPINIROLE HCL 1 MG PO TABS: 0.5000 mg | ORAL_TABLET | ORAL | 0 refills | Status: DC

## 2022-03-20 NOTE — Telephone Encounter (Signed)
pt called states that her resless legs is worse aand she can't take it anymore.  medication is not working.  ?

## 2022-03-20 NOTE — Telephone Encounter (Signed)
Returned call to patient.  She continues to have restless legs.  Will increase Requip to 0.5 mg for a week and increase to 1 mg after that. ? ?Patient does have upcoming appointment. ?

## 2022-03-20 NOTE — Addendum Note (Signed)
Addended by: Kyra Manges on: 03/20/2022 08:19 AM ? ? Modules accepted: Orders ? ?

## 2022-03-20 NOTE — Telephone Encounter (Signed)
done

## 2022-03-20 NOTE — Telephone Encounter (Signed)
Patient called and there was not a RX sent in for the Macrodantin. I sent the prescription to the pharmacy. Informed patient that she will need to complete both the amoxacillin and macrodantin. Once they are finished she is to return to the suppressive abx Trimethoprim. ?

## 2022-03-21 ENCOUNTER — Telehealth: Payer: Self-pay

## 2022-03-21 NOTE — Telephone Encounter (Signed)
error 

## 2022-03-22 NOTE — Telephone Encounter (Signed)
Left message to call office

## 2022-03-22 NOTE — Telephone Encounter (Signed)
Please call pt and see if she is agreeable to scheduling an appt with me to discuss restless legs and further w/up.  ?

## 2022-03-22 NOTE — Telephone Encounter (Signed)
Moved patient to 5/5/ ?

## 2022-03-22 NOTE — Telephone Encounter (Signed)
Please schedule her for Friday at 11:30 instead.  I have a meeting at this time on Wednesday as we discussed.  ?

## 2022-03-22 NOTE — Telephone Encounter (Signed)
Scheduled appt for 03/27/2022. ?

## 2022-03-26 ENCOUNTER — Telehealth: Payer: Medicare HMO

## 2022-03-27 ENCOUNTER — Ambulatory Visit: Payer: Medicare Other | Admitting: Internal Medicine

## 2022-03-28 ENCOUNTER — Telehealth: Payer: Self-pay | Admitting: Acute Care

## 2022-03-28 ENCOUNTER — Other Ambulatory Visit: Payer: Self-pay

## 2022-03-28 DIAGNOSIS — F17210 Nicotine dependence, cigarettes, uncomplicated: Secondary | ICD-10-CM

## 2022-03-28 DIAGNOSIS — Z122 Encounter for screening for malignant neoplasm of respiratory organs: Secondary | ICD-10-CM

## 2022-03-28 DIAGNOSIS — Z87891 Personal history of nicotine dependence: Secondary | ICD-10-CM

## 2022-03-28 NOTE — Telephone Encounter (Signed)
I spoke with Dr. Joanell Rising nurse Caryl Pina regarding the progression of ILD on her most recent screening scan. They are going to call her to get her scheduled for a follow up with Dr. Vella Kohler to go over these results with her, and discuss possible plan of care. I told her we will scan her again in 12 months , and share the results with them. I have asked them to let us know if they need the images of the CT. The patient would need to give permission to release images to Dr. Joanell Rising office.  ?

## 2022-03-28 NOTE — Telephone Encounter (Signed)
Order placed for annual LDCT and results/plan faxed to PCP ?

## 2022-03-28 NOTE — Telephone Encounter (Signed)
Please call patient and let her know her LDCT was read as a Lung RADS 2: nodules that are benign in appearance and behavior with a very low likelihood of becoming a clinically active cancer due to size or lack of growth. Recommendation per radiology is for a repeat LDCT in 12 months.  ?Please let her know there were incidental findings of smoking related interstitial lung changes ( also known as respiratory bronchiolitis) that have progressed since her previous scan.  She does need to work on quitting smoking. She recently lost her husband, and has actually had an increase in her daily smoking. She is followed by Dr. Vella Kohler,  I have called his office to make sure they are aware of this progressive change. They have messaged Dr. Vella Kohler.  ? ?There was also notation of Stable mild mediastinal lymphadenopathy, nonspecific., Three-vessel coronary atherosclerosis, Aortic Atherosclerosis (ICD10-I70.0) and Emphysema (ICD10-J43.9).She is on statin therapy , and had an echo 02/2021 which showed : ?NORMAL LEFT VENTRICULAR SYSTOLIC FUNCTION  ?NORMAL RIGHT VENTRICULAR SYSTOLIC FUNCTION  ?MILD VALVULAR REGURGITATION (See above)  ?NO VALVULAR STENOSIS  ?MILD MR, TR  ?EF 55% ? ?Langley Gauss, 12 month follow up LDCT, fax results to PCP and let them know I have notified Dr. Joanell Rising office regarding progressive ILD. Thanks so much ?

## 2022-03-28 NOTE — Telephone Encounter (Signed)
Discuss with pt at her 04/10/22 appt.  Continue f/u with pulmonary ?

## 2022-03-29 ENCOUNTER — Telehealth: Payer: Self-pay

## 2022-03-29 ENCOUNTER — Ambulatory Visit: Payer: Medicare Other | Admitting: Internal Medicine

## 2022-03-29 NOTE — Telephone Encounter (Signed)
-----   Message from Alvera Novel, [place] sent at 03/28/2022  3:06 PM EDT ----- ?Regarding: ptns ?Patient had a change in insurance starting 03/25/2022. Pt has [employer]. Card scanned in ? ?

## 2022-03-29 NOTE — Telephone Encounter (Signed)
Pt previously scheduled for PTNS. PA required w/ [employer]. Pt approved for 12 PTNS visits. $20 copay, covered at 100% after copay. Auth # [id number].  ?

## 2022-04-01 ENCOUNTER — Telehealth: Payer: Self-pay | Admitting: Cardiovascular Disease

## 2022-04-01 ENCOUNTER — Other Ambulatory Visit: Payer: Self-pay | Admitting: Psychiatry

## 2022-04-01 ENCOUNTER — Ambulatory Visit: Payer: Medicaid Other | Admitting: Physician Assistant

## 2022-04-01 ENCOUNTER — Encounter: Payer: Self-pay | Admitting: Cardiovascular Disease

## 2022-04-01 DIAGNOSIS — G2581 Restless legs syndrome: Secondary | ICD-10-CM

## 2022-04-01 NOTE — Telephone Encounter (Signed)
Pt c/o medication issue: ? ?1. Name of Medication:  ? amLODipine (NORVASC) 10 MG tablet  ? ? ?2. How are you currently taking this medication (dosage and times per day)? Take 1 tablet (10 mg total) by mouth daily. ? ?3. Are you having a reaction (difficulty breathing--STAT)? no ? ?4. What is your medication issue? Pt needs clarification on instructions. Pt states that she was told to take 2 tablets by mouth once daily. Please advise ? ?

## 2022-04-01 NOTE — Telephone Encounter (Signed)
Patient also sent mychart message which was replied to. Please see that encounter. Closing this encounter.  ?

## 2022-04-02 ENCOUNTER — Encounter: Payer: Self-pay | Admitting: Internal Medicine

## 2022-04-02 DIAGNOSIS — Z0100 Encounter for examination of eyes and vision without abnormal findings: Secondary | ICD-10-CM | POA: Diagnosis not present

## 2022-04-02 LAB — HM DIABETES EYE EXAM

## 2022-04-03 ENCOUNTER — Telehealth (INDEPENDENT_AMBULATORY_CARE_PROVIDER_SITE_OTHER): Payer: Medicare HMO | Admitting: Psychiatry

## 2022-04-03 ENCOUNTER — Encounter: Payer: Self-pay | Admitting: Psychiatry

## 2022-04-03 DIAGNOSIS — F17200 Nicotine dependence, unspecified, uncomplicated: Secondary | ICD-10-CM

## 2022-04-03 DIAGNOSIS — G2581 Restless legs syndrome: Secondary | ICD-10-CM

## 2022-04-03 DIAGNOSIS — Z634 Disappearance and death of family member: Secondary | ICD-10-CM

## 2022-04-03 DIAGNOSIS — G4701 Insomnia due to medical condition: Secondary | ICD-10-CM

## 2022-04-03 DIAGNOSIS — F418 Other specified anxiety disorders: Secondary | ICD-10-CM

## 2022-04-03 DIAGNOSIS — F330 Major depressive disorder, recurrent, mild: Secondary | ICD-10-CM | POA: Diagnosis not present

## 2022-04-03 MED ORDER — ROPINIROLE HCL 2 MG PO TABS: 2.0000 mg | ORAL_TABLET | Freq: Every day | ORAL | 0 refills | Status: DC

## 2022-04-03 MED ORDER — QUETIAPINE FUMARATE 25 MG PO TABS: 25.0000 mg | ORAL_TABLET | Freq: Every day | ORAL | 0 refills | Status: DC
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
## 2022-04-03 NOTE — Patient Instructions (Signed)
Quetiapine Tablets ?What is this medication? ?QUETIAPINE (kwe TYE a peen) treats schizophrenia and bipolar disorder. It works by balancing the levels of dopamine and serotonin in your brain, hormones that help regulate mood, behaviors, and thoughts. It belongs to a group of medications called antipsychotics. Antipsychotic medications can be used to treat several kinds of mental health conditions. ?This medicine may be used for other purposes; ask your health care provider or pharmacist if you have questions. ?COMMON BRAND NAME(S): Seroquel ?What should I tell my care team before I take this medication? ?They need to know if you have any of these conditions: ?Blockage in your bowel ?Cataracts ?Constipation ?Dementia ?Diabetes ?Difficulty swallowing ?Glaucoma ?Heart disease ?High levels of prolactin ?History of breast cancer ?History of irregular heartbeat ?Liver disease ?Low blood counts, like low white cell, platelet, or red cell counts ?Low blood pressure ?Parkinson's disease ?Prostate disease ?Seizures ?Suicidal thoughts, plans or attempt; a previous suicide attempt by you or a family member ?Thyroid disease ?Trouble passing urine ?An unusual or allergic reaction to quetiapine, other medications, foods, dyes, or preservatives ?Pregnant or trying to get pregnant ?Breast-feeding ?How should I use this medication? ?Take this medication by mouth. Swallow it with a drink of water. Follow the directions on the prescription label. If it upsets your stomach you can take it with food. Take your medication at regular intervals. Do not take it more often than directed. Do not stop taking except on the advice of your care team. ?A special MedGuide will be given to you by the pharmacist with each prescription and refill. Be sure to read this information carefully each time. ?Talk to your care team about the use of this medication in children. While this medication may be prescribed for children as young as 10 years for  selected conditions, precautions do apply. ?Patients over age 19 years may have a stronger reaction to this medication and need smaller doses. ?Overdosage: If you think you have taken too much of this medicine contact a poison control center or emergency room at once. ?NOTE: This medicine is only for you. Do not share this medicine with others. ?What if I miss a dose? ?If you miss a dose, take it as soon as you can. If it is almost time for your next dose, take only that dose. Do not take double or extra doses. ?What may interact with this medication? ?Do not take this medication with any of the following: ?Cisapride ?Dronedarone ?Metoclopramide ?Pimozide ?Thioridazine ?This medication may also interact with the following: ?Alcohol ?Antihistamines for allergy, cough, and cold ?Atropine ?Avasimibe ?Certain antivirals for HIV or hepatitis ?Certain medications for anxiety or sleep ?Certain medications for bladder problems like oxybutynin, tolterodine ?Certain medications for depression like amitriptyline, fluoxetine, nefazodone, sertraline ?Certain medications for fungal infections like fluconazole, ketoconazole, itraconazole, posaconazole ?Certain medications for stomach problems like dicyclomine, hyoscyamine ?Certain medications for travel sickness like scopolamine ?Cimetidine ?General anesthetics like halothane, isoflurane, methoxyflurane, propofol ?Ipratropium ?Levodopa or other medications for Parkinson's disease ?Medications for blood pressure ?Medications for seizures ?Medications that relax muscles for surgery ?Narcotic medications for pain ?Other medications that prolong the QT interval (cause an abnormal heart rhythm) ?Phenothiazines like chlorpromazine, prochlorperazine ?Rifampin ?[place] ?This list may not describe all possible interactions. Give your health care provider a list of all the medicines, herbs, non-prescription drugs, or dietary supplements you use. Also tell them if you smoke, drink  alcohol, or use illegal drugs. Some items may interact with your medicine. ?What should I watch for  while using this medication? ?Visit your care team for regular checks on your progress. Tell your care team if symptoms do not start to get better or if they get worse. Do not stop taking except on your care team's advice. You may develop a severe reaction. Your care team will tell you how much medication to take. ?You may need to have an eye exam before and during use of this medication. ?This medication may increase blood sugar. Ask your care team if changes in diet or medications are needed if you have diabetes. ?Patients and their families should watch out for new or worsening depression or thoughts of suicide. Also watch out for sudden or severe changes in feelings such as feeling anxious, agitated, panicky, irritable, hostile, aggressive, impulsive, severely restless, overly excited and hyperactive, or not being able to sleep. If this happens, especially at the beginning of antidepressant treatment or after a change in dose, call your care team. ?You may get dizzy or drowsy. Do not drive, use machinery, or do anything that needs mental alertness until you know how this medication affects you. Do not stand or sit up quickly, especially if you are an older patient. This reduces the risk of dizzy or fainting spells. Alcohol may interfere with the effect of this medication. Avoid alcoholic drinks. ?This medication can cause problems with controlling your body temperature. It can lower the response of your body to cold temperatures. If possible, stay indoors during cold weather. If you must go outdoors, wear warm clothes. It can also lower the response of your body to heat. Do not overheat. Do not over-exercise. Stay out of the sun when possible. If you must be in the sun, wear cool clothing. Drink plenty of water. If you have trouble controlling your body temperature, call your care team right away. ?What side  effects may I notice from receiving this medication? ?Side effects that you should report to your care team as soon as possible: ?Allergic reactions--skin rash, itching, hives, swelling of the face, lips, tongue, or throat ?Heart rhythm changes--fast or irregular heartbeat, dizziness, feeling faint or lightheaded, chest pain, trouble breathing ?High blood sugar (hyperglycemia)--increased thirst or amount of urine, unusual weakness or fatigue, blurry vision ?High fever, stiff muscles, increased sweating, fast or irregular heartbeat, and confusion, which may be signs of neuroleptic malignant syndrome ?High prolactin level--unexpected breast tissue growth, discharge from the nipple, change in sex drive or performance, irregular menstrual cycle ?Increase in blood pressure in children ?Infection--fever, chills, cough, or sore throat ?Low blood pressure--dizziness, feeling faint or lightheaded, blurry vision ?Low thyroid levels (hypothyroidism)--unusual weakness or fatigue, increased sensitivity to cold, constipation, hair loss, dry skin, weight gain, feelings of depression ?Pain or trouble swallowing ?Seizures ?Stroke--sudden numbness or weakness of the face, arm, or leg, trouble speaking, confusion, trouble walking, loss of balance or coordination, dizziness, severe headache, change in vision ?Sudden eye pain or change in vision such as blurry vision, seeing halos around lights, vision loss ?Thoughts of suicide or self-harm, worsening mood, feelings of depression ?Trouble passing urine ?Uncontrolled and repetitive body movements, muscle stiffness or spasms, tremors or shaking, loss of balance or coordination, restlessness, shuffling walk, which may be signs of extrapyramidal symptoms (EPS) ?Side effects that usually do not require medical attention (report to your care team if they continue or are bothersome): ?Constipation ?Dizziness ?Drowsiness ?Dry mouth ?Weight gain ?This list may not describe all possible side  effects. Call your doctor for medical advice about side effects. You may report side  effects to FDA at 1-800-FDA-1088. ?Where should I keep my medication? ?Keep out of the reach of children. ?Store at room temperature

## 2022-04-03 NOTE — Telephone Encounter (Signed)
Blood sugars - overall ok.  Continue to monitor.  Continue low carb diet and exercise.  Most of the blood pressure readings are a little higher than goal.  Have her continue to spot check.  Can schedule f/u appt with me to recheck and see if further medication warranted.   ?

## 2022-04-04 ENCOUNTER — Encounter: Payer: Self-pay | Admitting: Internal Medicine

## 2022-04-06 ENCOUNTER — Other Ambulatory Visit: Payer: Self-pay | Admitting: Internal Medicine

## 2022-04-08 ENCOUNTER — Ambulatory Visit: Payer: Medicare HMO | Admitting: Physician Assistant

## 2022-04-08 DIAGNOSIS — N3941 Urge incontinence: Secondary | ICD-10-CM

## 2022-04-08 DIAGNOSIS — N3946 Mixed incontinence: Secondary | ICD-10-CM | POA: Diagnosis not present

## 2022-04-08 DIAGNOSIS — N3281 Overactive bladder: Secondary | ICD-10-CM | POA: Diagnosis not present

## 2022-04-08 NOTE — Patient Instructions (Signed)
Tracking Your Bladder Symptoms    Patient Name:___________________________________________________   Sample: Day   Daytime Voids  Nighttime Voids Urgency for the Day(0-4) Number of Accidents Beverage Comments  Monday IIII II 2 I Water IIII Coffee  I      Week Starting:____________________________________   Day Daytime  Voids Nighttime  Voids Urgency for  The Day(0-4) Number of Accidents Beverages Comments                                                           This week my symptoms were:  O much better  O better O the same O worse   

## 2022-04-08 NOTE — Progress Notes (Signed)
PTNS ? ?Session # 1 ? ?Health & Social Factors: 0 ?Caffeine: 4 ?Alcohol: 0 ?Daytime voids #per day: 6 ?Night-time voids #per night: 4 ?Urgency: Severe ?Incontinence Episodes #per day: 2 ?Ankle used: Right ?Treatment Setting: 6 ?Feeling/ Response: Sensory  ?Comments: PTNS consent form reviewed and signed. Pt given voiding diary. ? ?Performed By: Gordy Clement, [place]  ? ?Follow Up: RTC in 1 week as scheduled.  ? ?

## 2022-04-09 ENCOUNTER — Other Ambulatory Visit: Payer: Self-pay | Admitting: Psychiatry

## 2022-04-09 DIAGNOSIS — G2581 Restless legs syndrome: Secondary | ICD-10-CM

## 2022-04-10 ENCOUNTER — Telehealth (INDEPENDENT_AMBULATORY_CARE_PROVIDER_SITE_OTHER): Payer: Medicare HMO | Admitting: Internal Medicine

## 2022-04-10 ENCOUNTER — Encounter: Payer: Self-pay | Admitting: Internal Medicine

## 2022-04-10 DIAGNOSIS — I700 Atherosclerosis of aorta: Secondary | ICD-10-CM | POA: Diagnosis not present

## 2022-04-10 DIAGNOSIS — I2510 Atherosclerotic heart disease of native coronary artery without angina pectoris: Secondary | ICD-10-CM

## 2022-04-10 DIAGNOSIS — I480 Paroxysmal atrial fibrillation: Secondary | ICD-10-CM

## 2022-04-10 DIAGNOSIS — K219 Gastro-esophageal reflux disease without esophagitis: Secondary | ICD-10-CM | POA: Diagnosis not present

## 2022-04-10 DIAGNOSIS — I10 Essential (primary) hypertension: Secondary | ICD-10-CM

## 2022-04-10 DIAGNOSIS — F330 Major depressive disorder, recurrent, mild: Secondary | ICD-10-CM

## 2022-04-10 DIAGNOSIS — J449 Chronic obstructive pulmonary disease, unspecified: Secondary | ICD-10-CM | POA: Diagnosis not present

## 2022-04-10 DIAGNOSIS — E1159 Type 2 diabetes mellitus with other circulatory complications: Secondary | ICD-10-CM | POA: Diagnosis not present

## 2022-04-10 DIAGNOSIS — I779 Disorder of arteries and arterioles, unspecified: Secondary | ICD-10-CM

## 2022-04-10 DIAGNOSIS — F17200 Nicotine dependence, unspecified, uncomplicated: Secondary | ICD-10-CM

## 2022-04-10 DIAGNOSIS — E7800 Pure hypercholesterolemia, unspecified: Secondary | ICD-10-CM

## 2022-04-10 DIAGNOSIS — R200 Anesthesia of skin: Secondary | ICD-10-CM

## 2022-04-10 DIAGNOSIS — J849 Interstitial pulmonary disease, unspecified: Secondary | ICD-10-CM
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
## 2022-04-11 ENCOUNTER — Ambulatory Visit: Payer: Medicaid Other | Admitting: Internal Medicine

## 2022-04-13 ENCOUNTER — Encounter: Payer: Self-pay | Admitting: Internal Medicine

## 2022-04-14 ENCOUNTER — Other Ambulatory Visit: Payer: Self-pay | Admitting: Internal Medicine

## 2022-04-14 ENCOUNTER — Other Ambulatory Visit: Payer: Self-pay | Admitting: Psychiatry

## 2022-04-14 ENCOUNTER — Encounter: Payer: Self-pay | Admitting: Internal Medicine

## 2022-04-14 DIAGNOSIS — R200 Anesthesia of skin: Secondary | ICD-10-CM | POA: Insufficient documentation

## 2022-04-14 DIAGNOSIS — G2581 Restless legs syndrome: Secondary | ICD-10-CM

## 2022-04-14 NOTE — Assessment & Plan Note (Signed)
Sees Dr Fleming.  Using breztri.  Breathing stable.  No increased sob, cough or congestion.  

## 2022-04-14 NOTE — Assessment & Plan Note (Signed)
On crestor.  Low cholesterol diet and exercise.  Follow lipid panel and liver function tests.   

## 2022-04-14 NOTE — Assessment & Plan Note (Signed)
Found to have afib.  Has been on eliquis.  No increased heart rate or palpitations.  Recently evaluated by cardiology as outlined - for possible syncopal episode.  Wore zio monitor.  Discussed with cardiology regarding results and recommendations prior to recent procedure.  Pt aware of risk of holding eliquis.  Understands and is agreeable to proceed with procedure.  Discussed with dentist regarding avoiding epinephrine.  Will need close intra op and post op monitoring of heart rate and blood pressure to avoid extremes.
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
## 2022-04-14 NOTE — Assessment & Plan Note (Signed)
Breztri.  Breathing stable.  

## 2022-04-14 NOTE — Assessment & Plan Note (Signed)
Continue lipitor  ?

## 2022-04-14 NOTE — Assessment & Plan Note (Signed)
No upper symptoms reported.  On protonix.   

## 2022-04-14 NOTE — Assessment & Plan Note (Signed)
Being followed by psychiatry.  On cymbalta.  Increased depression related to recent passing of her husband.  Discussed.   Follow.

## 2022-04-14 NOTE — Assessment & Plan Note (Signed)
On ozempic.  Tolerating.  Blood sugars as outlined.  Low carb diet and exercise.  Follow met b and a1c.

## 2022-04-14 NOTE — Assessment & Plan Note (Signed)
Have discussed the need to quit smoking.  Has declined.  Follow.

## 2022-04-14 NOTE — Assessment & Plan Note (Signed)
Numbness of left hand and arm.  Discussed possible etiologies.  Improved with movement. Not constant.  Discussed wrist splint.  Check NCS.

## 2022-04-14 NOTE — Assessment & Plan Note (Addendum)
Continue micardis '80mg'$  q day and amlodipine '10mg'$  q day.  Blood pressure as outlined.  Elevated today.  ahs been doing relatively well.  Follow pressures.  Follow metabolic panel.  Send in readings.  Will need close monitoring of heart rate and blood pressure - upcoming procedure.  Planning to have more teeth pulled.  Just had procedure and tolerated.  Understands risk of stopping blood thinner.  Avoid epinephrine.

## 2022-04-14 NOTE — Assessment & Plan Note (Signed)
Continue lipitor.  Continue eliquis.   

## 2022-04-14 NOTE — Assessment & Plan Note (Signed)
S/p stent placement.  Continue lipitor. Followed by cardiology.  On eliquis.  Will need to hold for procedure.  She is aware of risk of stopping medication and agreeable to proceed with procedure.  Ded/w cardiology regarding cardiac recommendations prior to her last planned dental procedure.

## 2022-04-15 ENCOUNTER — Ambulatory Visit: Payer: Medicare HMO | Admitting: Psychiatry

## 2022-04-15 ENCOUNTER — Ambulatory Visit: Payer: Medicare HMO | Admitting: Urology

## 2022-04-15 DIAGNOSIS — N3281 Overactive bladder: Secondary | ICD-10-CM

## 2022-04-15 NOTE — Progress Notes (Signed)
Session # 2   Health & Social Factors: 0 Caffeine: 3 Alcohol: 0 Daytime voids #per day: 6 Night-time voids #per night: 4 Urgency: Severe Incontinence Episodes #per day: 2 Ankle used: Right Treatment Setting: 4 Feeling/ Response: Sensory  Comments: .   Performed By: Gaspar Cola  CMA    Follow Up: RTC in 1 week as scheduled

## 2022-04-15 NOTE — Patient Instructions (Signed)
Tracking Your Bladder Symptoms    Patient Name:___________________________________________________   Sample: Day   Daytime Voids  Nighttime Voids Urgency for the Day(0-4) Number of Accidents Beverage Comments  Monday IIII II 2 I Water IIII Coffee  I      Week Starting:____________________________________   Day Daytime  Voids Nighttime  Voids Urgency for  The Day(0-4) Number of Accidents Beverages Comments                                                           This week my symptoms were:  O much better  O better O the same O worse   

## 2022-04-16 ENCOUNTER — Telehealth: Payer: Self-pay

## 2022-04-16 MED ORDER — TRIMETHOPRIM 100 MG PO TABS: 100.0000 mg | ORAL_TABLET | Freq: Every day | ORAL | 3 refills | Status: DC

## 2022-04-16 NOTE — Telephone Encounter (Signed)
Trimethoprim refilled

## 2022-04-17 ENCOUNTER — Other Ambulatory Visit: Payer: Self-pay

## 2022-04-17 ENCOUNTER — Telehealth: Payer: Self-pay | Admitting: Internal Medicine

## 2022-04-17 ENCOUNTER — Encounter: Payer: Self-pay | Admitting: Internal Medicine

## 2022-04-17 MED ORDER — ACCU-CHEK SOFTCLIX LANCETS MISC: 12 refills | Status: DC

## 2022-04-17 NOTE — Telephone Encounter (Signed)
Sent!

## 2022-04-17 NOTE — Telephone Encounter (Signed)
Refill on True Metrix Super Plus 28 gauge lancets Send to [facility]

## 2022-04-20 ENCOUNTER — Encounter: Payer: Self-pay | Admitting: Internal Medicine

## 2022-04-22 ENCOUNTER — Encounter: Payer: Self-pay | Admitting: Internal Medicine

## 2022-04-22 ENCOUNTER — Other Ambulatory Visit: Payer: Self-pay | Admitting: Psychiatry

## 2022-04-22 DIAGNOSIS — F418 Other specified anxiety disorders: Secondary | ICD-10-CM

## 2022-04-22 DIAGNOSIS — F330 Major depressive disorder, recurrent, mild: Secondary | ICD-10-CM

## 2022-04-22 DIAGNOSIS — G4701 Insomnia due to medical condition: Secondary | ICD-10-CM

## 2022-04-23 ENCOUNTER — Telehealth: Payer: Self-pay

## 2022-04-23 ENCOUNTER — Other Ambulatory Visit: Payer: Self-pay

## 2022-04-23 ENCOUNTER — Ambulatory Visit: Payer: Medicare HMO | Admitting: Psychiatry

## 2022-04-23 ENCOUNTER — Ambulatory Visit (INDEPENDENT_AMBULATORY_CARE_PROVIDER_SITE_OTHER): Payer: Medicare HMO | Admitting: Physician Assistant

## 2022-04-23 DIAGNOSIS — I480 Paroxysmal atrial fibrillation: Secondary | ICD-10-CM

## 2022-04-23 DIAGNOSIS — F330 Major depressive disorder, recurrent, mild: Secondary | ICD-10-CM

## 2022-04-23 DIAGNOSIS — N3281 Overactive bladder: Secondary | ICD-10-CM

## 2022-04-23 DIAGNOSIS — G4701 Insomnia due to medical condition: Secondary | ICD-10-CM

## 2022-04-23 DIAGNOSIS — F418 Other specified anxiety disorders: Secondary | ICD-10-CM

## 2022-04-23 MED ORDER — APIXABAN 5 MG PO TABS: 5.0000 mg | ORAL_TABLET | Freq: Two times a day (BID) | ORAL | 0 refills | Status: DC

## 2022-04-23 MED ORDER — QUETIAPINE FUMARATE 50 MG PO TABS: 50.0000 mg | ORAL_TABLET | Freq: Every day | ORAL | 0 refills | Status: DC

## 2022-04-23 NOTE — Telephone Encounter (Signed)
pt left a message that she needed refill on the quetiapine.  she states that the '25mg'$  did not work for her so she been taking 2 pills to make '50mg'$ 

## 2022-04-23 NOTE — Progress Notes (Signed)
Session # 3   Health & Social Factors: 0 Caffeine: 3 Alcohol: 0 Daytime voids #per day: 6 Night-time voids #per night: 4 Urgency: Severe Incontinence Episodes #per day: 2 Ankle used: Right Treatment Setting: 6 Feeling/ Response: Sensory  Comments: .   Performed By: Gaspar Cola  CMA    Follow Up: RTC in 1 week as scheduled

## 2022-04-23 NOTE — Patient Instructions (Signed)
Tracking Your Bladder Symptoms    Patient Name:___________________________________________________   Sample: Day   Daytime Voids  Nighttime Voids Urgency for the Day(0-4) Number of Accidents Beverage Comments  Monday IIII II 2 I Water IIII Coffee  I      Week Starting:____________________________________   Day Daytime  Voids Nighttime  Voids Urgency for  The Day(0-4) Number of Accidents Beverages Comments                                                           This week my symptoms were:  O much better  O better O the same O worse   

## 2022-04-23 NOTE — Telephone Encounter (Signed)
spoke with patient that rx was sent into pharmacy but before she makes any changes to medication she needs to contact office 1st. it can be dangers, you can take too much and also it can be reason for dismal

## 2022-04-23 NOTE — Telephone Encounter (Signed)
I have sent Seroquel/quetiapine-with dosage increased to 50 mg to [facility].  I will have Janett Billow CMA contact this patient to provide education that she should not be making changes with her medication dosages without consulting her providers.  If she does she could be discharged from the practice.

## 2022-04-25 ENCOUNTER — Encounter: Payer: Self-pay | Admitting: Internal Medicine

## 2022-04-25 NOTE — Telephone Encounter (Signed)
Notify - blood sugars are ok.  Blood pressures are staying a little higher than goal.  Have her to continue to monitor and schedule her an appt to follow up regarding her blood pressures.

## 2022-04-26 NOTE — Telephone Encounter (Signed)
Pt scheduled for 6/29 at 11am. Will continue monitoring her pressures.

## 2022-04-26 NOTE — Telephone Encounter (Signed)
Pt has appt 7/26 (virtual) , do you want her in office sooner?

## 2022-04-26 NOTE — Telephone Encounter (Signed)
Earlier appt.  Will need to adjust blood pressure medication

## 2022-04-29 ENCOUNTER — Ambulatory Visit: Payer: Medicare HMO | Admitting: *Deleted

## 2022-04-29 ENCOUNTER — Ambulatory Visit: Payer: Medicare HMO | Admitting: Physician Assistant

## 2022-04-29 DIAGNOSIS — N3281 Overactive bladder: Secondary | ICD-10-CM | POA: Diagnosis not present

## 2022-04-29 DIAGNOSIS — I10 Essential (primary) hypertension: Secondary | ICD-10-CM

## 2022-04-29 DIAGNOSIS — E1159 Type 2 diabetes mellitus with other circulatory complications: Secondary | ICD-10-CM

## 2022-04-29 DIAGNOSIS — J449 Chronic obstructive pulmonary disease, unspecified: Secondary | ICD-10-CM

## 2022-04-29 NOTE — Chronic Care Management (AMB) (Signed)
  Care Management   Outreach Note  04/29/2022 Name: Kathryn Friedman MRN: [id number] DOB: 10/18/1956  Referred by: Einar Pheasant, MD Reason for referral : Care Coordination (INITIAL)   Successful outreach to patient.  RNCM introduced self and role; patient verbalizes agrees to [employer].  States she has another appointment she needs to get ready for and request call back another day and time.  Follow Up Plan:  The care management team will reach out to the patient again over the next 30 days.   Hubert Azure RN, MSN RN Care Management Coordinator [place] [phone] [email or web address]

## 2022-04-29 NOTE — Progress Notes (Signed)
PTNS  Session # 4   Health & Social Factors: No changes Caffeine: 3 Alcohol: none Daytime voids #per day: 10 Night-time voids #per night: 2 Urgency: Mild Incontinence Episodes #per day: 0 Ankle used: Left Treatment Setting: 13 Feeling/ Response: Sensory Comments: Pt tolerated well, no complications were noted.   Performed By: Bradly Bienenstock CMA  Follow Up: RTC in 1 week for PTNS #5.

## 2022-04-29 NOTE — Patient Instructions (Signed)
Tracking Your Bladder Symptoms    Patient Name:___________________________________________________   Sample: Day   Daytime Voids  Nighttime Voids Urgency for the Day(0-4) Number of Accidents Beverage Comments  Monday IIII II 2 I Water IIII Coffee  I      Week Starting:____________________________________   Day Daytime  Voids Nighttime  Voids Urgency for  The Day(0-4) Number of Accidents Beverages Comments                                                           This week my symptoms were:  O much better  O better O the same O worse   

## 2022-05-01 ENCOUNTER — Other Ambulatory Visit: Payer: Self-pay | Admitting: Psychiatry

## 2022-05-01 DIAGNOSIS — G2581 Restless legs syndrome: Secondary | ICD-10-CM

## 2022-05-02 ENCOUNTER — Encounter: Payer: Self-pay | Admitting: Internal Medicine

## 2022-05-02 ENCOUNTER — Telehealth: Payer: Self-pay | Admitting: Cardiovascular Disease

## 2022-05-02 MED ORDER — PROPRANOLOL HCL ER 60 MG PO CP24: 60.0000 mg | ORAL_CAPSULE | Freq: Two times a day (BID) | ORAL | 1 refills | Status: DC

## 2022-05-02 NOTE — Telephone Encounter (Signed)
Pt c/o medication issue:  1. Name of Medication: propranolol ER (INDERAL LA) 60 MG 24 hr capsule  2. How are you currently taking this medication (dosage and times per day)? 1 tablet daily  3. Are you having a reaction (difficulty breathing--STAT)? no  4. What is your medication issue? Patient states her mychart says 1 tablet twice a day, but her pharmacy says 1 tablet daily. She states she had called in before and asked how she is supposed to take it and they told her 1 tablet daily. She states she does better with taking it twice a day. Please advise.

## 2022-05-02 NOTE — Telephone Encounter (Signed)
See attached note.

## 2022-05-02 NOTE — Telephone Encounter (Signed)
Spoke w/ pt. Advised her that her chart shows that when she wore her event monitor in April, Dr. Rockey Situ recommended:  ----- Message from Minna Merritts, MD sent at 03/06/2022 12:18 PM EDT ----- Event monitor Frequent episodes of atrial fibrillation noted, 3% burden Would recommend we try to suppress her A-fib Would suggest we increase her propranolol from 60 mg once a day up to 60 mg ER twice a day      She states that her MyChart shows this change, but she was told by someone in our office that it is to be taken once daily.   Advised her that I will update her chart and send in refill. She is very appreciative of the call.

## 2022-05-03 ENCOUNTER — Ambulatory Visit (INDEPENDENT_AMBULATORY_CARE_PROVIDER_SITE_OTHER): Payer: Medicare HMO

## 2022-05-03 VITALS — Ht 64.0 in | Wt 198.0 lb

## 2022-05-03 DIAGNOSIS — Z0000 Encounter for general adult medical examination without abnormal findings: Secondary | ICD-10-CM | POA: Diagnosis not present
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
## 2022-05-03 NOTE — Patient Instructions (Addendum)
  Ms. Shisler , Thank you for taking time to come for your Medicare Wellness Visit. I appreciate your ongoing commitment to your health goals. Please review the following plan we discussed and let me know if I can assist you in the future.   These are the goals we discussed:  Goals      Follow up with Primary Care Provider     As needed.        This is a list of the screening recommended for you and due dates:  Health Maintenance  Topic Date Due   Mammogram  04/05/2022   COVID-19 Vaccine (4 - Booster) 05/19/2022*   Zoster (Shingles) Vaccine (1 of 2) 08/03/2022*   Pneumonia Vaccine (2 - PCV) 10/04/2022*   Flu Shot  06/25/2022   Hemoglobin A1C  09/04/2022   Complete foot exam   03/10/2023   Eye exam for diabetics  04/09/2023   Tetanus Vaccine  04/21/2028   Colon Cancer Screening  03/29/2030   DEXA scan (bone density measurement)  Completed   Hepatitis C Screening: USPSTF Recommendation to screen - Ages 57-79 yo.  Completed   HPV Vaccine  Aged Out  *Topic was postponed. The date shown is not the original due date.

## 2022-05-06 ENCOUNTER — Ambulatory Visit: Payer: Medicare HMO | Admitting: Physician Assistant

## 2022-05-06 DIAGNOSIS — N3281 Overactive bladder: Secondary | ICD-10-CM | POA: Diagnosis not present

## 2022-05-06 DIAGNOSIS — N3946 Mixed incontinence: Secondary | ICD-10-CM

## 2022-05-06 NOTE — Progress Notes (Signed)
PTNS  Session # 5  Health & Social Factors: Pt presents with headache x 2 days, states she has history of headache, has taken OTC medication with no relief. Advised pt to f/u with PCP. Pt voiced understanding.  Caffeine: 4 Alcohol: 0 Daytime voids #per day: 10 Night-time voids #per night: 1 Urgency: Mild Incontinence Episodes #per day: 1 Ankle used: Right Treatment Setting: 2 Feeling/ Response: Sensory & Toe Flex  Comments: Patient not drinking any water, all fluid intake listed as Pepsi, Mountain Dew, and Coffee.   Daytime frequency likely behavioral and worsened due to her significant caffeine consumption as above. She has previously been counseled to avoid these by GI due to her chronic gastritis. Debroah Loop, PA-C   Performed By: Gordy Clement, CMA   Follow Up: RTC in 1 week for PTNS

## 2022-05-06 NOTE — Patient Instructions (Signed)
Tracking Your Bladder Symptoms    Patient Name:___________________________________________________   Sample: Day   Daytime Voids  Nighttime Voids Urgency for the Day(0-4) Number of Accidents Beverage Comments  Monday IIII II 2 I Water IIII Coffee  I      Week Starting:____________________________________   Day Daytime  Voids Nighttime  Voids Urgency for  The Day(0-4) Number of Accidents Beverages Comments                                                           This week my symptoms were:  O much better  O better O the same O worse   

## 2022-05-08 ENCOUNTER — Encounter: Payer: Self-pay | Admitting: Cardiovascular Disease

## 2022-05-08 ENCOUNTER — Encounter: Payer: Self-pay | Admitting: Internal Medicine

## 2022-05-08 ENCOUNTER — Ambulatory Visit
Admission: RE | Admit: 2022-05-08 | Discharge: 2022-05-08 | Disposition: A | Discharge status: Home / Self Care | Payer: Medicare HMO | Source: Ambulatory Visit | Attending: Internal Medicine | Admitting: Internal Medicine

## 2022-05-08 DIAGNOSIS — Z1231 Encounter for screening mammogram for malignant neoplasm of breast: Secondary | ICD-10-CM | POA: Diagnosis not present

## 2022-05-09 ENCOUNTER — Encounter: Payer: Self-pay | Admitting: Internal Medicine

## 2022-05-10 NOTE — Telephone Encounter (Signed)
Please call and let her know that her pm sugars appear (on the most recent checks) to be trending down.  Continue low carb diet. The blood pressures are still a little higher than goal.  Her mouth problems may be contributing.  Per appt notice, she sees Dr Rockey Situ 05/13/22 - have her take her blood pressure readings and let him review as well.  We will continue to follow.  If remains elevated, will need to adjust medication

## 2022-05-12 NOTE — Progress Notes (Unsigned)
Cardiology Office Note  Date:  05/13/2022   ID:  JEWELENE MAIRENA, DOB 05/23/1956, MRN [id number]  PCP:  Einar Pheasant, MD   Chief Complaint  Patient presents with   6 month follow up     Discuss zio monitor results. Patient c/o A-fib, elevated blood pressure and shortness of breath. Medications reviewed by the patient verbally.     HPI:  [name] is a 66 year old woman with past medical history of Depression Coronary artery disease catheterization June 2019 moderate to severe proximal and mid RCA disease occluded distal RCA with collaterals left to right mild LAD and circumflex disease Stent x2 2007 Smoking history/COPD Hyperlipidemia Who presents for f/u of her coronary disease, paroxysmal atrial fibrillation  LOV 3/23  Zio monitor reviewed Frequent episodes of atrial fibrillation noted, 3% burden 151 Supraventricular Tachycardia runs occurred, the run with the fastest interval lasting 5 beats with a max rate of 179 bpm, the  longest lasting 21.4 secs with an avg rate of 101 bpm.   We had recommended she increase propranolol from 60 mg once a day up to 60 mg ER twice a day  Still smoking 2 ppd No further near syncope or syncope She does have periodic dizziness, unclear etiology  Reports that she has COPD, not on oxygen Trying to quit  Chronic SOB Uses a cane Wheelchair today Uses medical alert  BP still "running high" 130s to 150s Heart rate 70 to 80s Numbers reviewed from home on today's visit  Lab work reviewed A1C 7.5 Total chol 134, LDL 62  EKG personally reviewed by myself on todays visit Normal sinus rhythm rate 69 bpm no significant ST-T wave changes  Note indicating history of DVT Unclear history paroxysmal atrial fibrillation, on Eliquis  Other past medical history reviewed  episode of near syncope in shower, discussed March 2023 "Woke up on the ground" in the shower but reports never losing consciousness Minimal warning "Could  it be atrial fibrillation" Has not had any further episodes  Husband died, fall, On hospice, 2021/12/21  Carotid u/s  07/2020 1. Minimal atherosclerotic plaque bilaterally resulting in less than 50% stenosis of the bilateral ICAs. 2. Antegrade flow is noted within both vertebral arteries.  Echo NORMAL LEFT VENTRICULAR SYSTOLIC FUNCTION  NORMAL RIGHT VENTRICULAR SYSTOLIC FUNCTION  MILD VALVULAR REGURGITATION (See above)  NO VALVULAR STENOSIS  MILD MR, TR  EF 55%   Underwent hip replacement Following surgery developed acute problem with a hip Seen in the ER after hearing a popping sound followed by right leg/hip pain.  Found to have avulsion fracture of greater trochanter left hip DVT prophylaxis following procedure Eliquis 2.5 twice daily Was started on Eliquis 2.5 twice daily by Dr. Roland Rack at discharge June 13, 2021    PMH:   has a past medical history of Anemia, Anginal pain ([place]), Anxiety, Arthritis, Asthma, Bilateral carotid artery stenosis, Blood in stool, Chronic diarrhea, COPD (chronic obstructive pulmonary disease) ([place]), Coronary artery disease, Current use of long term anticoagulation, Depression, Diverticulitis, Diverticulosis, Dizzinesses, Dysphagia, Dyspnea, Fatty infiltration of liver, GERD (gastroesophageal reflux disease), Headache, History of 2019 novel coronavirus disease (COVID-19) (12/09/2020), History of 2019 novel coronavirus disease (COVID-19) (12/20/2020), Hypertension, Hypertriglyceridemia, ILD (interstitial lung disease) ([place]), Lump in the abdomen, OSA on CPAP, Overactive bladder, PSVT (paroxysmal supraventricular tachycardia) ([place]), Spastic colon, T2DM (type 2 diabetes mellitus) ([place]) (05/2008), Tobacco abuse, and Venous insufficiency of both lower extremities.  PSH:    Past Surgical History:  Procedure Laterality Date  ABDOMINAL HYSTERECTOMY  with left ovary in place [place] Left 10/14/2017   calcs bx, fibrosis giant cell  reaction and chronic inflammation, negative for malignancy.    CATARACT EXTRACTION, BILATERAL     CESAREAN SECTION  1984   CHOLECYSTECTOMY  1985   COLONOSCOPY WITH PROPOFOL N/A 09/13/2016   Procedure: COLONOSCOPY WITH PROPOFOL;  Surgeon: Manya Silvas, MD;  Location[facility] ENDOSCOPY;  Service: Endoscopy;  Laterality: N/A;   COLONOSCOPY WITH PROPOFOL N/A 11/09/2018   Procedure: COLONOSCOPY WITH PROPOFOL;  Surgeon: Manya Silvas, MD;  Location: [facility] ENDOSCOPY;  Service: Endoscopy;  Laterality: N/A;   COLONOSCOPY WITH PROPOFOL N/A 03/29/2020   Procedure: COLONOSCOPY WITH PROPOFOL;  Surgeon: Robert Bellow, MD;  Location: [facility] ENDOSCOPY;  Service: Endoscopy;  Laterality: N/A;   CORONARY ANGIOPLASTY WITH STENT PLACEMENT N/A 07/24/2006   75% pRCA; 3.5 x 28 mm Cypher DES placed; Location: [place]; Surgeons: Katrine Coho, MD   ESOPHAGOGASTRODUODENOSCOPY (EGD) WITH PROPOFOL N/A 02/02/2018   Procedure: ESOPHAGOGASTRODUODENOSCOPY (EGD) WITH PROPOFOL;  Surgeon: Manya Silvas, MD;  Location: [facility] ENDOSCOPY;  Service: Endoscopy;  Laterality: N/A;   ESOPHAGOGASTRODUODENOSCOPY (EGD) WITH PROPOFOL N/A 03/29/2020   Procedure: ESOPHAGOGASTRODUODENOSCOPY (EGD) WITH PROPOFOL;  Surgeon: Robert Bellow, MD;  Location: [facility] ENDOSCOPY;  Service: Endoscopy;  Laterality: N/A;   EYE SURGERY     JOINT REPLACEMENT     bilateral knee replacements   KNEE ARTHROSCOPY  Arthroscopic left knee surgery    KNEE SURGERY  status post knee surgey    LEFT HEART CATH AND CORONARY ANGIOGRAPHY Left 05/14/2018   Procedure: LEFT HEART CATH AND CORONARY ANGIOGRAPHY;  Surgeon: Corey Skains, MD;  Location: [facility];  Service: Cardiovascular;  Laterality: Left;   REPLACEMENT TOTAL KNEE  ([facility])   SHOULDER SURGERY  shoulder operation secondary to a torn tendon   TOTAL HIP ARTHROPLASTY Left 06/12/2021   Procedure: TOTAL HIP ARTHROPLASTY;  Surgeon: Corky Mull, MD;  Location: [facility] ORS;  Service: Orthopedics;   Laterality: Left;    Current Outpatient Medications  Medication Sig Dispense Refill   Accu-Chek Softclix Lancets lancets Use as instructed 100 each 12   acetaminophen (TYLENOL) 325 MG tablet Take 650 mg by mouth every 6 (six) hours as needed.     albuterol (VENTOLIN HFA) 108 (90 Base) MCG/ACT inhaler INHALE 2 PUFFS FOUR TIMES A DAY 8.5 g 11   amLODipine (NORVASC) 10 MG tablet Take 1 tablet (10 mg total) by mouth daily. 90 tablet 0   apixaban (ELIQUIS) 5 MG TABS tablet Take 1 tablet (5 mg total) by mouth 2 (two) times daily. 180 tablet 0   Blood Glucose Monitoring Suppl (TRUE METRIX METER) w/Device KIT      Budeson-Glycopyrrol-Formoterol (BREZTRI AEROSPHERE) 160-9-4.8 MCG/ACT AERO Inhale 2 puffs into the lungs in the morning and at bedtime. 32.1 g 3   CALCIUM PO Take 600 mg by mouth daily.      cholecalciferol (VITAMIN D3) 25 MCG (1000 UNIT) tablet Take 1,000 Units by mouth daily.     Cyanocobalamin (VITAMIN B-12 PO) Take 2,500 mcg by mouth daily.     DULoxetine (CYMBALTA) 60 MG capsule Take 1 capsule (60 mg total) by mouth daily. 90 capsule 1   famotidine (PEPCID) 40 MG tablet TAKE 1 TABLET EVERY DAY 90 tablet 1   isosorbide mononitrate (IMDUR) 30 MG 24 hr tablet Take 1 tablet (30 mg total) by mouth daily. 90 tablet 0   Multiple Vitamin (MULTIVITAMIN)  tablet Take 1 tablet by mouth daily.     mupirocin ointment (BACTROBAN) 2 % Apply 1 application. topically 2 (two) times daily. 22 g 0   nystatin (MYCOSTATIN/NYSTOP) powder APPLY TO THE AFFECTED AREA(S) TWICE DAILY 30 g 0   pantoprazole (PROTONIX) 40 MG tablet TAKE 1 TABLET TWICE DAILY BEFORE MEALS 180 tablet 1   propranolol ER (INDERAL LA) 60 MG 24 hr capsule Take 1 capsule (60 mg total) by mouth 2 (two) times daily. 180 capsule 1   QUEtiapine (SEROQUEL) 50 MG tablet Take 1 tablet (50 mg total) by mouth at bedtime. 30 tablet 0   rOPINIRole (REQUIP) 2 MG tablet TAKE 1 TABLET AT BEDTIME (DOSE CHANGE) 30 tablet 1   rosuvastatin (CRESTOR) 20 MG  tablet TAKE 1 TABLET EVERY DAY 90 tablet 0   Semaglutide,0.25 or 0.5MG/DOS, (OZEMPIC, 0.25 OR 0.5 MG/DOSE,) 2 MG/1.5ML SOPN Inject 0.25 mg weekly for 4 weeks then increase to 0.5 mg weekly 1.5 mL 2   telmisartan (MICARDIS) 80 MG tablet TAKE 1 TABLET EVERY DAY 90 tablet 1   trimethoprim (TRIMPEX) 100 MG tablet Take 1 tablet (100 mg total) by mouth daily. 90 tablet 3   TRUE METRIX BLOOD GLUCOSE TEST test strip TEST BLOOD SUGAR TWICE DAILY AS DIRECTED 200 strip 1   Vibegron (GEMTESA) 75 MG TABS Take 75 mg by mouth daily. 30 tablet 11   eszopiclone (LUNESTA) 2 MG TABS tablet Take 1 tablet (2 mg total) by mouth at bedtime as needed for sleep. Take immediately before bedtime (Patient not taking: Reported on 05/13/2022) 90 tablet 0   famotidine (PEPCID) 40 MG tablet famotidine 40 mg tablet  Take 1 tablet every day by oral route. (Patient not taking: Reported on 05/13/2022)     No current facility-administered medications for this visit.     Allergies:   Varenicline, Varenicline tartrate, Jardiance [empagliflozin], Metformin and related, and Methylprednisolone   Social History:  The patient  reports that she has been smoking cigarettes. She has a 90.00 pack-year smoking history. She has been exposed to tobacco smoke. She has never used smokeless tobacco. She reports that she does not drink alcohol and does not use drugs.   Family History:   family history includes Bipolar disorder in her sister; Cirrhosis in her brother; Colon cancer in her paternal aunt; Depression in her mother; Heart disease in her father; Hepatitis C in her brother; Lung cancer in her mother; Mitral valve prolapse in her mother and sister; Other in her mother.    Review of Systems: Review of Systems  Constitutional: Negative.   HENT: Negative.    Respiratory:  Positive for shortness of breath.   Cardiovascular:  Positive for palpitations.  Gastrointestinal: Negative.   Musculoskeletal: Negative.   Neurological: Negative.    Psychiatric/Behavioral: Negative.    All other systems reviewed and are negative.   PHYSICAL EXAM: VS:  BP 120/70 (BP Location: Left Arm, Patient Position: Sitting, Cuff Size: Normal)   Pulse 69   Ht 5' 4" (1.626 m)   Wt 206 lb 6 oz (93.6 kg)   SpO2 96%   BMI 35.42 kg/m  , BMI Body mass index is 35.42 kg/m. Constitutional:  oriented to person, place, and time. No distress.  HENT:  Head: Grossly normal Eyes:  no discharge. No scleral icterus.  Neck: No JVD, no carotid bruits  Cardiovascular: Regular rate and rhythm, no murmurs appreciated Pulmonary/Chest: Clear to auscultation bilaterally, no wheezes or rails Abdominal: Soft.  no distension.  no tenderness.  Musculoskeletal: Normal range of motion Neurological:  normal muscle tone. Coordination normal. No atrophy Skin: Skin warm and dry Psychiatric: normal affect, pleasant  Recent Labs: 07/06/2021: Hemoglobin 12.8; Platelets 236.0; TSH 0.87 03/05/2022: ALT 18; BUN 9; Creatinine, Ser 0.73; Potassium 4.2; Sodium 134    Lipid Panel Lab Results  Component Value Date   CHOL 134 03/05/2022   HDL 46.30 03/05/2022   LDLCALC 62 03/05/2022   TRIG 126.0 03/05/2022      Wt Readings from Last 3 Encounters:  05/13/22 206 lb 6 oz (93.6 kg)  05/03/22 198 lb (89.8 kg)  04/10/22 198 lb (89.8 kg)     ASSESSMENT AND PLAN:  Problem List Items Addressed This Visit       Cardiology Problems   Essential hypertension, benign   Hypercholesterolemia   CAD (coronary artery disease) - Primary   Relevant Orders   EKG 12-Lead   Carotid artery disease ([place])   Relevant Orders   EKG 12-Lead   Aortic atherosclerosis (HCC)   Paroxysmal A-fib (HCC)   PSVT (paroxysmal supraventricular tachycardia) (HCC)   Relevant Orders   EKG 12-Lead   Other Visit Diagnoses     Smoker       Palpitations          Near syncope Etiology unclear, likely secondary to arrhythmia Zio monitor showing 3% atrial fibrillation as well as short runs of SVT  up to 20 seconds continue Eliquis 5 twice daily Blood pressure elevated and heart rate 70 to 80 bpm Recommend she increase propranolol up to 80 twice daily of the extended release  PAF NSR today On eliquis Zio monitor 3% atrial fib, episodes of SVT We will increase propranolol as above extended release 80 twice daily  Smoker We have encouraged her to continue to work on weaning her cigarettes and smoking cessation. She will continue to work on this and does not want any assistance with chantix.    Coronary disease with stable angina Currently with no symptoms of angina. No further workup at this time. Continue current medication regimen.   Total encounter time more than 30 minutes  Greater than 50% was spent in counseling and coordination of care with the patient    Signed, Esmond Plants, M.D., Ph.D. [place], [place]

## 2022-05-13 ENCOUNTER — Encounter: Payer: Self-pay | Admitting: Internal Medicine

## 2022-05-13 ENCOUNTER — Ambulatory Visit: Payer: Medicare HMO | Admitting: Physician Assistant

## 2022-05-13 ENCOUNTER — Other Ambulatory Visit: Payer: Self-pay | Admitting: Psychiatry

## 2022-05-13 ENCOUNTER — Ambulatory Visit: Payer: Medicare HMO | Admitting: Cardiovascular Disease

## 2022-05-13 ENCOUNTER — Encounter: Payer: Self-pay | Admitting: Cardiovascular Disease

## 2022-05-13 VITALS — BP 120/70 | HR 69 | Ht 64.0 in | Wt 206.4 lb

## 2022-05-13 DIAGNOSIS — F17200 Nicotine dependence, unspecified, uncomplicated: Secondary | ICD-10-CM

## 2022-05-13 DIAGNOSIS — I10 Essential (primary) hypertension: Secondary | ICD-10-CM | POA: Diagnosis not present

## 2022-05-13 DIAGNOSIS — N3281 Overactive bladder: Secondary | ICD-10-CM

## 2022-05-13 DIAGNOSIS — N3941 Urge incontinence: Secondary | ICD-10-CM

## 2022-05-13 DIAGNOSIS — R002 Palpitations: Secondary | ICD-10-CM | POA: Diagnosis not present

## 2022-05-13 DIAGNOSIS — I700 Atherosclerosis of aorta: Secondary | ICD-10-CM | POA: Diagnosis not present

## 2022-05-13 DIAGNOSIS — E7800 Pure hypercholesterolemia, unspecified: Secondary | ICD-10-CM | POA: Diagnosis not present

## 2022-05-13 DIAGNOSIS — I471 Supraventricular tachycardia: Secondary | ICD-10-CM | POA: Diagnosis not present

## 2022-05-13 DIAGNOSIS — I25118 Atherosclerotic heart disease of native coronary artery with other forms of angina pectoris: Secondary | ICD-10-CM | POA: Diagnosis not present

## 2022-05-13 DIAGNOSIS — I6523 Occlusion and stenosis of bilateral carotid arteries: Secondary | ICD-10-CM | POA: Diagnosis not present

## 2022-05-13 DIAGNOSIS — G2581 Restless legs syndrome: Secondary | ICD-10-CM

## 2022-05-13 DIAGNOSIS — I480 Paroxysmal atrial fibrillation: Secondary | ICD-10-CM | POA: Diagnosis not present

## 2022-05-13 DIAGNOSIS — N3946 Mixed incontinence: Secondary | ICD-10-CM

## 2022-05-13 MED ORDER — PROPRANOLOL HCL ER 80 MG PO CP24: 80.0000 mg | ORAL_CAPSULE | Freq: Two times a day (BID) | ORAL | 1 refills | Status: DC

## 2022-05-13 NOTE — Progress Notes (Signed)
PTNS  Session # 6  Health & Social Factors:  Caffeine: 2 Alcohol: 0 Daytime voids #per day: 8 Night-time voids #per night: 1 Urgency: None Incontinence Episodes #per day: 0 Ankle used: Left Treatment Setting: 6 Feeling/ Response: Sensory & Toe Flex Comments: Patient with significant improvement in urinary symptoms with reducing caffeine intake. Pt very pleased.   Performed By: Gordy Clement, CMA   Follow Up: RTC in 1 wk for PTNS

## 2022-05-13 NOTE — Patient Instructions (Signed)
Tracking Your Bladder Symptoms    Patient Name:___________________________________________________   Sample: Day   Daytime Voids  Nighttime Voids Urgency for the Day(0-4) Number of Accidents Beverage Comments  Monday IIII II 2 I Water IIII Coffee  I      Week Starting:____________________________________   Day Daytime  Voids Nighttime  Voids Urgency for  The Day(0-4) Number of Accidents Beverages Comments                                                           This week my symptoms were:  O much better  O better O the same O worse   

## 2022-05-13 NOTE — Patient Instructions (Signed)
Medication Instructions:  Please increase the propranolol ER up to 80 mg twice a day  Continue to monitor blood pressure  If you need a refill on your cardiac medications before your next appointment, please call your pharmacy.   Lab work: No new labs needed  Testing/Procedures: No new testing needed  Follow-Up: At [facility], you and your health needs are our priority.  As part of our continuing mission to provide you with exceptional heart care, we have created designated Provider Care Teams.  These Care Teams include your primary Cardiologist (physician) and Advanced Practice Providers (APPs -  Physician Assistants and Nurse Practitioners) who all work together to provide you with the care you need, when you need it.  You will need a follow up appointment in 6 months  Providers on your designated Care Team:   Murray Hodgkins, NP Christell Faith, PA-C Cadence Kathlen Mody, [place]  COVID-19 Vaccine Information can be found at: [email or web address] For questions related to vaccine distribution or appointments, please email [email or web address] or call [phone].

## 2022-05-14 ENCOUNTER — Other Ambulatory Visit: Payer: Self-pay | Admitting: Psychiatry

## 2022-05-14 ENCOUNTER — Ambulatory Visit: Payer: Medicare HMO | Admitting: Psychiatry

## 2022-05-14 ENCOUNTER — Encounter: Payer: Self-pay | Admitting: Psychiatry

## 2022-05-14 VITALS — BP 120/72 | HR 81 | Temp 97.9°F | Wt 208.6 lb

## 2022-05-14 DIAGNOSIS — F418 Other specified anxiety disorders: Secondary | ICD-10-CM | POA: Diagnosis not present

## 2022-05-14 DIAGNOSIS — G4701 Insomnia due to medical condition: Secondary | ICD-10-CM | POA: Diagnosis not present

## 2022-05-14 DIAGNOSIS — F3342 Major depressive disorder, recurrent, in full remission: Secondary | ICD-10-CM

## 2022-05-14 DIAGNOSIS — F17200 Nicotine dependence, unspecified, uncomplicated: Secondary | ICD-10-CM | POA: Diagnosis not present

## 2022-05-14 DIAGNOSIS — Z634 Disappearance and death of family member: Secondary | ICD-10-CM | POA: Diagnosis not present

## 2022-05-14 DIAGNOSIS — F330 Major depressive disorder, recurrent, mild: Secondary | ICD-10-CM

## 2022-05-14 MED ORDER — QUETIAPINE FUMARATE 50 MG PO TABS: 50.0000 mg | ORAL_TABLET | Freq: Every day | ORAL | 0 refills | Status: DC

## 2022-05-14 NOTE — Progress Notes (Signed)
[facility] OP Progress Note  05/14/2022 1:54 PM Kathryn Friedman  MRN:  [id number]  Chief Complaint:  Chief Complaint  Patient presents with   Follow-up: 66 year old Caucasian female with history of MDD, anxiety disorder, insomnia, bereavement, presented for medication management.   HPI: Kathryn Friedman is a 66 year old Caucasian female, widowed, retired, on [employer], lives in [place], has a history of MDD, insomnia, obstructive sleep apnea on CPAP, eye surgery, coronary artery disease status post stent placement, COPD, diabetes mellitus, hypertension, GERD, interstitial lung disease, iron deficiency was evaluated in office today.  Patient today reports she is currently improving with regards to her grief as well as depression symptoms.  Denies any significant sadness or crying spells.  Reports she has been more motivated and taking care of herself, her home.  Been spending time with her pet dog and cat.  Patient also reports her sister is planning to move to [place] soon.  That also makes her happy.  She recently reconnected with her sister again and they have a better relationship now.  Patient does have pain of her right wrist-carpal tunnel syndrome.  Currently has a splint.  Patient is compliant on her medications, denies side effects.  Reports sleep has improved.  Does not have any significant restless leg symptoms at this time.  Denies suicidality, homicidality or perceptual disturbances.  Patient denies any other concerns today.  Visit Diagnosis:    ICD-10-CM   1. MDD (major depressive disorder), recurrent, in full remission ([place])  F33.42 QUEtiapine (SEROQUEL) 50 MG tablet    2. Insomnia due to medical condition  G47.01 QUEtiapine (SEROQUEL) 50 MG tablet   OSA, RLS, mood    3. Other specified anxiety disorders  F41.8 QUEtiapine (SEROQUEL) 50 MG tablet   limited symptom attacks    4. Bereavement  Z63.4     5. Tobacco use disorder  F17.200       Past Psychiatric History:  Reviewed past psychiatric history from progress note on 06/26/2020.  Past trials of trazodone, Cymbalta, Lexapro, Rexulti, doxepin, gabapentin, Lunesta.  Patient was previously under the care of [employer] [facility].    Past Medical History:  Past Medical History:  Diagnosis Date   Anemia    Anginal pain (HCC)    Anxiety    Arthritis    back and knees   Asthma    Bilateral carotid artery stenosis    Blood in stool    Chronic diarrhea    COPD (chronic obstructive pulmonary disease) (HCC)    Coronary artery disease    a.) 75% pRCA; 3.5 x 28 mm Cypher DES placed on 07/24/2006   Current use of long term anticoagulation    Clopidogrel   Depression    secondary to the death of her husband (died 56)   Diverticulitis    Diverticulosis    Dizzinesses    Dysphagia    Dyspnea    Fatty infiltration of liver    GERD (gastroesophageal reflux disease)    Headache    History of 2019 novel coronavirus disease (COVID-19) 12/09/2020   History of 2019 novel coronavirus disease (COVID-19) 12/20/2020   Hypertension    Hypertriglyceridemia    ILD (interstitial lung disease) ([name])    Lump in the abdomen    OSA on CPAP    Overactive bladder    PSVT (paroxysmal supraventricular tachycardia) (HCC)    Spastic colon    T2DM (type 2 diabetes mellitus) ([place]) 05/2008   Tobacco abuse    Venous insufficiency  of both lower extremities     Past Surgical History:  Procedure Laterality Date   ABDOMINAL HYSTERECTOMY  with left ovary in place [place] Left 10/14/2017   calcs bx, fibrosis giant cell reaction and chronic inflammation, negative for malignancy.    CATARACT EXTRACTION, BILATERAL     CESAREAN SECTION  1984   CHOLECYSTECTOMY  1985   COLONOSCOPY WITH PROPOFOL N/A 09/13/2016   Procedure: COLONOSCOPY WITH PROPOFOL;  Surgeon: Manya Silvas, MD;  Location: [facility] ENDOSCOPY;  Service: Endoscopy;  Laterality: N/A;   COLONOSCOPY WITH PROPOFOL N/A 11/09/2018    Procedure: COLONOSCOPY WITH PROPOFOL;  Surgeon: Manya Silvas, MD;  Location: [facility] ENDOSCOPY;  Service: Endoscopy;  Laterality: N/A;   COLONOSCOPY WITH PROPOFOL N/A 03/29/2020   Procedure: COLONOSCOPY WITH PROPOFOL;  Surgeon: Robert Bellow, MD;  Location: [facility] ENDOSCOPY;  Service: Endoscopy;  Laterality: N/A;   CORONARY ANGIOPLASTY WITH STENT PLACEMENT N/A 07/24/2006   75% pRCA; 3.5 x 28 mm Cypher DES placed; Location: [place]; Surgeons: Katrine Coho, MD   ESOPHAGOGASTRODUODENOSCOPY (EGD) WITH PROPOFOL N/A 02/02/2018   Procedure: ESOPHAGOGASTRODUODENOSCOPY (EGD) WITH PROPOFOL;  Surgeon: Manya Silvas, MD;  Location: [facility] ENDOSCOPY;  Service: Endoscopy;  Laterality: N/A;   ESOPHAGOGASTRODUODENOSCOPY (EGD) WITH PROPOFOL N/A 03/29/2020   Procedure: ESOPHAGOGASTRODUODENOSCOPY (EGD) WITH PROPOFOL;  Surgeon: Robert Bellow, MD;  Location: [facility] ENDOSCOPY;  Service: Endoscopy;  Laterality: N/A;   EYE SURGERY     JOINT REPLACEMENT     bilateral knee replacements   KNEE ARTHROSCOPY  Arthroscopic left knee surgery    KNEE SURGERY  status post knee surgey    LEFT HEART CATH AND CORONARY ANGIOGRAPHY Left 05/14/2018   Procedure: LEFT HEART CATH AND CORONARY ANGIOGRAPHY;  Surgeon: Corey Skains, MD;  Location: [place] CV LAB;  Service: Cardiovascular;  Laterality: Left;   REPLACEMENT TOTAL KNEE  ([facility])   SHOULDER SURGERY  shoulder operation secondary to a torn tendon   TOTAL HIP ARTHROPLASTY Left 06/12/2021   Procedure: TOTAL HIP ARTHROPLASTY;  Surgeon: Corky Mull, MD;  Location: [facility] ORS;  Service: Orthopedics;  Laterality: Left;    Family Psychiatric History: Reviewed family psychiatric history from progress note on 06/26/2020.  Family History:  Family History  Problem Relation Age of Onset   Other Mother        Hit by a fire truck and has had multiple operations on her back , and has history of MVP    Mitral valve prolapse Mother    Lung cancer Mother    Depression Mother     Heart disease Father        myocardial infarction and is status post bypass surgery   Mitral valve prolapse Sister    Bipolar disorder Sister    Hepatitis C Brother    Cirrhosis Brother    Colon cancer Paternal Aunt    Breast cancer Neg Hx    Prostate cancer Neg Hx    Bladder Cancer Neg Hx    Kidney cancer Neg Hx     Social History: Reviewed social history from progress note on 06/26/2020. Social History   Socioeconomic History   Marital status: Widowed    Spouse name: Sherlin Sonier   Number of children: 1   Years of education: 12   Highest education level: 12th grade  Occupational History    Employer: [employer]  Tobacco Use   Smoking status: Every Day    Packs/day: 2.00    Years:  45.00    Total pack years: 90.00    Types: Cigarettes    Passive exposure: Current   Smokeless tobacco: Never   Tobacco comments:    Patient reported she is not ready to quit at this time due to recent loss of her husband.   Vaping Use   Vaping Use: Some days   Substances: Flavoring  Substance and Sexual Activity   Alcohol use: No    Alcohol/week: 0.0 standard drinks of alcohol   Drug use: No   Sexual activity: Not Currently  Other Topics Concern   Not on file  Social History Narrative   Lives with husband   Social Determinants of Health   Financial Resource Strain: Medium Risk (01/11/2022)   Overall Financial Resource Strain (CARDIA)    Difficulty of Paying Living Expenses: Somewhat hard  Food Insecurity: No Food Insecurity (05/03/2022)   Hunger Vital Sign    Worried About Running Out of Food in the Last Year: Never true    Ran Out of Food in the Last Year: Never true  Transportation Needs: No Transportation Needs (05/03/2022)   PRAPARE - Hydrologist (Medical): No    Lack of Transportation (Non-Medical): No  Physical Activity: Inactive (05/03/2022)   Exercise Vital Sign    Days of Exercise per Week: 0 days    Minutes of Exercise per Session: 0 min  Stress: No  Stress Concern Present (05/03/2022)   [place]    Feeling of Stress : Not at all  Social Connections: Moderately Isolated (05/03/2022)   Social Connection and Isolation Panel [NHANES]    Frequency of Communication with Friends and Family: More than three times a week    Frequency of Social Gatherings with Friends and Family: More than three times a week    Attends Religious Services: 1 to 4 times per year    Active Member of [employer] or Organizations: No    Attends Archivist Meetings: Never    Marital Status: Widowed    Allergies:  Allergies  Allergen Reactions   Varenicline Other (See Comments)    "I got really depressed" "I got really depressed" CHANTEX   Varenicline Tartrate    Jardiance [Empagliflozin] Other (See Comments)    Yeast infection   Metformin And Related Other (See Comments)    Diarrhea, even with XR   Methylprednisolone Nausea Only and Nausea And Vomiting    Metabolic Disorder Labs: Lab Results  Component Value Date   HGBA1C 7.5 (H) 03/05/2022   No results found for: "PROLACTIN" Lab Results  Component Value Date   CHOL 134 03/05/2022   TRIG 126.0 03/05/2022   HDL 46.30 03/05/2022   CHOLHDL 3 03/05/2022   VLDL 25.2 03/05/2022   LDLCALC 62 03/05/2022   LDLCALC 51 10/22/2021   Lab Results  Component Value Date   TSH 0.87 07/06/2021   TSH 1.56 03/17/2020    Therapeutic Level Labs: No results found for: "LITHIUM" No results found for: "VALPROATE" No results found for: "CBMZ"  Current Medications: Current Outpatient Medications  Medication Sig Dispense Refill   Accu-Chek Softclix Lancets lancets Use as instructed 100 each 12   acetaminophen (TYLENOL) 325 MG tablet Take 650 mg by mouth every 6 (six) hours as needed.     albuterol (VENTOLIN HFA) 108 (90 Base) MCG/ACT inhaler INHALE 2 PUFFS FOUR TIMES A DAY 8.5 g 11   amLODipine (NORVASC) 10 MG tablet Take 1 tablet (10  mg total)  by mouth daily. 90 tablet 0   apixaban (ELIQUIS) 5 MG TABS tablet Take 1 tablet (5 mg total) by mouth 2 (two) times daily. 180 tablet 0   Blood Glucose Monitoring Suppl (TRUE METRIX METER) w/Device KIT      Budeson-Glycopyrrol-Formoterol (BREZTRI AEROSPHERE) 160-9-4.8 MCG/ACT AERO Inhale 2 puffs into the lungs in the morning and at bedtime. 32.1 g 3   CALCIUM PO Take 600 mg by mouth daily.      cholecalciferol (VITAMIN D3) 25 MCG (1000 UNIT) tablet Take 1,000 Units by mouth daily.     Cyanocobalamin (VITAMIN B-12 PO) Take 2,500 mcg by mouth daily.     DULoxetine (CYMBALTA) 60 MG capsule Take 1 capsule (60 mg total) by mouth daily. 90 capsule 1   famotidine (PEPCID) 40 MG tablet TAKE 1 TABLET EVERY DAY 90 tablet 1   HM MELATONIN PO 5 mg by Other route at bedtime as needed. patch     isosorbide mononitrate (IMDUR) 30 MG 24 hr tablet Take 1 tablet (30 mg total) by mouth daily. 90 tablet 0   Multiple Vitamin (MULTIVITAMIN) tablet Take 1 tablet by mouth daily.     mupirocin ointment (BACTROBAN) 2 % Apply 1 application. topically 2 (two) times daily. 22 g 0   nystatin (MYCOSTATIN/NYSTOP) powder APPLY TO THE AFFECTED AREA(S) TWICE DAILY 30 g 0   pantoprazole (PROTONIX) 40 MG tablet TAKE 1 TABLET TWICE DAILY BEFORE MEALS 180 tablet 1   propranolol ER (INDERAL LA) 80 MG 24 hr capsule Take 1 capsule (80 mg total) by mouth 2 (two) times daily. 180 capsule 1   rOPINIRole (REQUIP) 2 MG tablet TAKE 1 TABLET AT BEDTIME (DOSE CHANGE) 30 tablet 1   rosuvastatin (CRESTOR) 20 MG tablet TAKE 1 TABLET EVERY DAY 90 tablet 0   Semaglutide,0.25 or 0.5MG/DOS, (OZEMPIC, 0.25 OR 0.5 MG/DOSE,) 2 MG/1.5ML SOPN Inject 0.25 mg weekly for 4 weeks then increase to 0.5 mg weekly 1.5 mL 2   telmisartan (MICARDIS) 80 MG tablet TAKE 1 TABLET EVERY DAY 90 tablet 1   trimethoprim (TRIMPEX) 100 MG tablet Take 1 tablet (100 mg total) by mouth daily. 90 tablet 3   TRUE METRIX BLOOD GLUCOSE TEST test strip TEST BLOOD SUGAR TWICE DAILY AS  DIRECTED 200 strip 1   Vibegron (GEMTESA) 75 MG TABS Take 75 mg by mouth daily. 30 tablet 11   amoxicillin (AMOXIL) 500 MG capsule Take 500 mg by mouth 3 (three) times daily. (Patient not taking: Reported on 05/14/2022)     HYDROcodone-acetaminophen (NORCO) 7.5-325 MG tablet Take 1 tablet by mouth every 6 (six) hours as needed. (Patient not taking: Reported on 05/14/2022)     QUEtiapine (SEROQUEL) 50 MG tablet Take 1 tablet (50 mg total) by mouth at bedtime. 90 tablet 0   No current facility-administered medications for this visit.     Musculoskeletal: Strength & Muscle Tone: within normal limits Gait & Station: normal Patient leans: N/A  Psychiatric Specialty Exam: Review of Systems  Musculoskeletal:        Rt. Sided wrist pain in a splint  Psychiatric/Behavioral:         Grieving-improving  All other systems reviewed and are negative.   Blood pressure 120/72, pulse 81, temperature 97.9 F (36.6 C), temperature source Temporal, weight 208 lb 9.6 oz (94.6 kg).Body mass index is 35.81 kg/m.  General Appearance: Casual  Eye Contact:  Good  Speech:  Clear and Coherent  Volume:  Normal  Mood:   Grieving-improving  Affect:  Full Range  Thought Process:  Goal Directed and Descriptions of Associations: Intact  Orientation:  Full (Time, Place, and Person)  Thought Content: Logical   Suicidal Thoughts:  No  Homicidal Thoughts:  No  Memory:  Immediate;   Fair Recent;   Fair Remote;   Fair  Judgement:  Fair  Insight:  Fair  Psychomotor Activity:  Normal  Concentration:  Concentration: Fair and Attention Span: Fair  Recall:  [employer] of Knowledge: Fair  Language: Fair  Akathisia:  No  Handed:  Right  AIMS (if indicated): done  Assets:  Communication Skills Desire for Improvement Housing Social Support  ADL's:  Intact  Cognition: WNL  Sleep:  Fair   Screenings: [place] Office Visit from 05/14/2022 in [place] Video Visit  from 04/03/2022 in [place] Total Score 0 0      GAD-7    Flowsheet Row Video Visit from 06/25/2021 in [place] Video Visit from 05/03/2021 in [place] Video Visit from 04/05/2021 in [place]  Total GAD-7 Score _0 PHQ2-9    [place] Visit from 05/14/2022 in [place] Video Visit from 04/10/2022 in [facility] Video Visit from 04/03/2022 in [place] Visit from 03/05/2022 in [place] Office Visit from 01/09/2022 in [place]  PHQ-2 Total Score _1 PHQ-9 Total Score _2 [place] Office Visit from 05/14/2022 in [place] Video Visit from 04/03/2022 in [place] Office Visit from 01/09/2022 in [place]        Assessment and Plan: Kathryn Friedman is a 66 year old Caucasian female who has a history of MDD, anxiety disorder, multiple medical problems was evaluated in office today.  Patient is currently improving with regards to her mood.  Will benefit from the following plan.  Plan MDD in full remission Cymbalta 60 mg p.o. daily Seroquel 50 mg p.o. nightly.  Other specified anxiety disorder-improving Cymbalta 60 mg p.o. daily Patient was referred for CBT-pending  Insomnia-improving Seroquel as prescribed Continue CPAP for OSA. Continue Requip 2 mg p.o. nightly for restless leg symptoms.   Bereavement-improving Referred for grief counseling.  Tobacco use disorder-unstable Provided counseling for 2 minutes.  Follow-up in clinic in 2 months or sooner if needed.  This note was generated in part or whole with voice recognition  software. Voice recognition is usually quite accurate but there are transcription errors that can and very often do occur. I apologize for any typographical errors that were not detected and corrected.    Ursula Alert, MD 05/14/2022, 1:54 PM
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
## 2022-05-15 ENCOUNTER — Telehealth: Payer: Medicare HMO

## 2022-05-16 ENCOUNTER — Encounter: Payer: Self-pay | Admitting: Internal Medicine

## 2022-05-17 ENCOUNTER — Ambulatory Visit: Payer: Medicare HMO | Admitting: *Deleted

## 2022-05-17 DIAGNOSIS — E1159 Type 2 diabetes mellitus with other circulatory complications: Secondary | ICD-10-CM

## 2022-05-17 DIAGNOSIS — I10 Essential (primary) hypertension: Secondary | ICD-10-CM

## 2022-05-18 ENCOUNTER — Other Ambulatory Visit: Payer: Self-pay | Admitting: Psychiatry

## 2022-05-18 DIAGNOSIS — F418 Other specified anxiety disorders: Secondary | ICD-10-CM

## 2022-05-18 DIAGNOSIS — F3342 Major depressive disorder, recurrent, in full remission: Secondary | ICD-10-CM

## 2022-05-18 DIAGNOSIS — G4701 Insomnia due to medical condition: Secondary | ICD-10-CM

## 2022-05-20 ENCOUNTER — Ambulatory Visit: Payer: Medicare HMO | Admitting: Physician Assistant

## 2022-05-20 DIAGNOSIS — N3281 Overactive bladder: Secondary | ICD-10-CM | POA: Diagnosis not present

## 2022-05-20 NOTE — Progress Notes (Signed)
PTNS  Session # 7  Health & Social Factors: No changes Caffeine: 2 Alcohol: 0 Daytime voids #per day: 8 Night-time voids #per night: 2 Urgency: Strong Incontinence Episodes #per day: 1 Ankle used: Right Treatment Setting: 9 Feeling/ Response: Toe flex & sensory Comments: Pt tolerated well, no complications noted.   Performed By: Franchot Erichsen CMA  Follow Up: RTC in 1 week for PTNS 8.

## 2022-05-22 ENCOUNTER — Encounter: Payer: Self-pay | Admitting: Internal Medicine

## 2022-05-22 ENCOUNTER — Other Ambulatory Visit: Payer: Self-pay

## 2022-05-22 MED ORDER — GEMTESA 75 MG PO TABS: 75.0000 mg | ORAL_TABLET | Freq: Every day | ORAL | 3 refills | Status: DC

## 2022-05-22 NOTE — Telephone Encounter (Addendum)
Pt left message regarding gemtesa. Pt wants gemtesa sent to [facility]. Medication sent pt aware.

## 2022-05-23 ENCOUNTER — Encounter: Payer: Self-pay | Admitting: Internal Medicine

## 2022-05-23 ENCOUNTER — Ambulatory Visit (INDEPENDENT_AMBULATORY_CARE_PROVIDER_SITE_OTHER): Payer: Medicare HMO | Admitting: Internal Medicine

## 2022-05-23 VITALS — BP 130/72 | HR 72 | Temp 98.0°F | Resp 16 | Ht 64.0 in | Wt 203.4 lb

## 2022-05-23 DIAGNOSIS — I480 Paroxysmal atrial fibrillation: Secondary | ICD-10-CM | POA: Diagnosis not present

## 2022-05-23 DIAGNOSIS — I2510 Atherosclerotic heart disease of native coronary artery without angina pectoris: Secondary | ICD-10-CM

## 2022-05-23 DIAGNOSIS — F17200 Nicotine dependence, unspecified, uncomplicated: Secondary | ICD-10-CM

## 2022-05-23 DIAGNOSIS — J449 Chronic obstructive pulmonary disease, unspecified: Secondary | ICD-10-CM | POA: Diagnosis not present

## 2022-05-23 DIAGNOSIS — F330 Major depressive disorder, recurrent, mild: Secondary | ICD-10-CM

## 2022-05-23 DIAGNOSIS — I10 Essential (primary) hypertension: Secondary | ICD-10-CM

## 2022-05-23 DIAGNOSIS — E7800 Pure hypercholesterolemia, unspecified: Secondary | ICD-10-CM

## 2022-05-23 DIAGNOSIS — K219 Gastro-esophageal reflux disease without esophagitis: Secondary | ICD-10-CM

## 2022-05-23 DIAGNOSIS — R200 Anesthesia of skin: Secondary | ICD-10-CM

## 2022-05-23 DIAGNOSIS — J849 Interstitial pulmonary disease, unspecified: Secondary | ICD-10-CM

## 2022-05-23 DIAGNOSIS — E1159 Type 2 diabetes mellitus with other circulatory complications: Secondary | ICD-10-CM

## 2022-05-23 DIAGNOSIS — R221 Localized swelling, mass and lump, neck: Secondary | ICD-10-CM

## 2022-05-23 DIAGNOSIS — I700 Atherosclerosis of aorta: Secondary | ICD-10-CM

## 2022-05-23 DIAGNOSIS — I779 Disorder of arteries and arterioles, unspecified: Secondary | ICD-10-CM

## 2022-05-23 DIAGNOSIS — N3281 Overactive bladder: Secondary | ICD-10-CM

## 2022-05-23 DIAGNOSIS — R350 Frequency of micturition: Secondary | ICD-10-CM
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
## 2022-05-23 NOTE — Telephone Encounter (Signed)
Printed to review at pt appt today

## 2022-05-24 ENCOUNTER — Telehealth: Payer: Self-pay | Admitting: Internal Medicine

## 2022-05-24 LAB — URINALYSIS, ROUTINE W REFLEX MICROSCOPIC
Bilirubin Urine: NEGATIVE
Hgb urine dipstick: NEGATIVE
Ketones, ur: NEGATIVE
Leukocytes,Ua: NEGATIVE
Nitrite: NEGATIVE
Specific Gravity, Urine: <=1.005 — AB (ref 1.000–1.030)
Total Protein, Urine: NEGATIVE
Urine Glucose: NEGATIVE
Urobilinogen, UA: 0.2 (ref 0.0–1.0)
pH: 6 (ref 5.0–8.0)

## 2022-05-24 LAB — URINE CULTURE
MICRO NUMBER:: 13589080
SPECIMEN QUALITY:: ADEQUATE

## 2022-05-24 NOTE — Telephone Encounter (Signed)
Spoke with pt, explained her results and let her know that the culture has not came back yet. Pt gave a verbal understanding.

## 2022-05-24 NOTE — Telephone Encounter (Signed)
Pt called stating she does not understand her blood test results and would like to be called

## 2022-05-25 ENCOUNTER — Other Ambulatory Visit: Payer: Self-pay | Admitting: Internal Medicine

## 2022-05-25 ENCOUNTER — Encounter: Payer: Self-pay | Admitting: Internal Medicine

## 2022-05-25 DIAGNOSIS — R221 Localized swelling, mass and lump, neck: Secondary | ICD-10-CM | POA: Insufficient documentation

## 2022-05-25 NOTE — Assessment & Plan Note (Signed)
No upper symptoms reported.  On protonix.   

## 2022-05-25 NOTE — Assessment & Plan Note (Signed)
Continue lipitor.  Continue eliquis.   

## 2022-05-25 NOTE — Assessment & Plan Note (Signed)
Sees Dr Fleming.  Using breztri.  Breathing stable.  No increased sob, cough or congestion.  

## 2022-05-25 NOTE — Assessment & Plan Note (Signed)
Seeing urology.  PTNS (currently).  Noticed over the past week, some increased urinary frequency.  Wants to confirm no infection.  Check urine and culture to confirm no infection.  Continue f/u with urology.

## 2022-05-25 NOTE — Assessment & Plan Note (Signed)
S/p stent placement.  Continue lipitor. Followed by cardiology.  On eliquis.  Just saw Dr Rockey Situ as outlined.

## 2022-05-25 NOTE — Assessment & Plan Note (Signed)
Continue micardis '80mg'$  q day and amlodipine '10mg'$  q day.  Dr Rockey Situ just increased propranolol. Blood pressures reviewed.  Appear to be trending down with most recent readings: 120-130s/60-70s.  Hold on making changes.   Follow pressures.  Follow metabolic panel.

## 2022-05-25 NOTE — Assessment & Plan Note (Signed)
Being followed by psychiatry.  On cymbalta.  Increased depression related to recent passing of her husband. Overall appears to be doing better.  Follow.  

## 2022-05-25 NOTE — Assessment & Plan Note (Signed)
Persistent.  Smoker.  Has completed dental work.  Check CT scan to further evaluate.

## 2022-05-25 NOTE — Assessment & Plan Note (Signed)
Have discussed the need to quit smoking.  Follow.  

## 2022-05-25 NOTE — Assessment & Plan Note (Signed)
On ozempic.  Tolerating.  Blood sugars as outlined.  Low carb diet and exercise.  Follow met b and a1c.

## 2022-05-25 NOTE — Assessment & Plan Note (Signed)
Continue lipitor  ?

## 2022-05-25 NOTE — Assessment & Plan Note (Signed)
Breztri.  Breathing stable.  

## 2022-05-25 NOTE — Assessment & Plan Note (Addendum)
Still having issues with her left arm - hand and arm numbness.  Has been referred to neurology.  Has appt in 07/2022.  Was asking for earlier appt.  See if we can get earlier appt for NCS.

## 2022-05-25 NOTE — Assessment & Plan Note (Signed)
On crestor.  Low cholesterol diet and exercise.  Follow lipid panel and liver function tests.   

## 2022-05-25 NOTE — Assessment & Plan Note (Signed)
On eiluqis.  Appears to have regular rhythm today.  Stable.  Follow.

## 2022-05-27 ENCOUNTER — Telehealth: Payer: Self-pay

## 2022-05-27 ENCOUNTER — Ambulatory Visit: Payer: Medicare HMO | Admitting: Physician Assistant

## 2022-05-27 NOTE — Telephone Encounter (Signed)
Pt advised normal ucx via mychart

## 2022-05-29 ENCOUNTER — Encounter: Payer: Self-pay | Admitting: Internal Medicine

## 2022-05-29 ENCOUNTER — Telehealth: Payer: Self-pay

## 2022-05-29 DIAGNOSIS — M79644 Pain in right finger(s): Secondary | ICD-10-CM | POA: Diagnosis not present

## 2022-05-29 DIAGNOSIS — M19041 Primary osteoarthritis, right hand: Secondary | ICD-10-CM | POA: Diagnosis not present

## 2022-05-29 NOTE — Telephone Encounter (Signed)
Noted  

## 2022-05-30 DIAGNOSIS — J449 Chronic obstructive pulmonary disease, unspecified: Secondary | ICD-10-CM | POA: Diagnosis not present

## 2022-05-30 DIAGNOSIS — Z79899 Other long term (current) drug therapy: Secondary | ICD-10-CM | POA: Diagnosis not present

## 2022-05-30 DIAGNOSIS — E119 Type 2 diabetes mellitus without complications: Secondary | ICD-10-CM | POA: Diagnosis not present

## 2022-05-30 DIAGNOSIS — I10 Essential (primary) hypertension: Secondary | ICD-10-CM | POA: Diagnosis not present

## 2022-06-03 ENCOUNTER — Ambulatory Visit: Payer: Medicare Other | Admitting: Physician Assistant

## 2022-06-03 DIAGNOSIS — N3281 Overactive bladder: Secondary | ICD-10-CM | POA: Diagnosis not present

## 2022-06-03 NOTE — Progress Notes (Signed)
PTNS  Session # 8  Health & Social Factors: Pt currently on Prednisone for arthritis  Caffeine: 1 Alcohol: 0 Daytime voids #per day: 8.5 Night-time voids #per night: 2 Urgency: Severe Incontinence Episodes #per day: 1 Ankle used: Left Treatment Setting: 1 Feeling/ Response: Sensory & Toe Flex Comments: Pt notes significant increase in night time urinary urgency. Pt missed last week's PTNS treatment due to back pain, is also on prednisone which could possibly be a contributing factor.   Performed By: Gordy Clement, CMA   Follow Up: RTC in 1 week for PTNS

## 2022-06-03 NOTE — Patient Instructions (Signed)
Tracking Your Bladder Symptoms    Patient Name:___________________________________________________   Sample: Day   Daytime Voids  Nighttime Voids Urgency for the Day(0-4) Number of Accidents Beverage Comments  Monday IIII II 2 I Water IIII Coffee  I      Week Starting:____________________________________   Day Daytime  Voids Nighttime  Voids Urgency for  The Day(0-4) Number of Accidents Beverages Comments                                                           This week my symptoms were:  O much better  O better O the same O worse   

## 2022-06-05 ENCOUNTER — Encounter: Payer: Self-pay | Admitting: Internal Medicine

## 2022-06-06 NOTE — Telephone Encounter (Signed)
Blood pressures overall improved.  Blood sugars appear to be improving.  Continue to monitor.  Agree with appt for chest congestion, recent decrease O2 sat, etc.  Monitor oxygen level.

## 2022-06-07 ENCOUNTER — Telehealth (INDEPENDENT_AMBULATORY_CARE_PROVIDER_SITE_OTHER): Payer: Medicare HMO | Admitting: Internal Medicine

## 2022-06-07 ENCOUNTER — Encounter: Payer: Self-pay | Admitting: Internal Medicine

## 2022-06-07 ENCOUNTER — Ambulatory Visit: Admission: RE | Admit: 2022-06-07 | Payer: Medicare HMO | Source: Ambulatory Visit

## 2022-06-07 DIAGNOSIS — J439 Emphysema, unspecified: Secondary | ICD-10-CM

## 2022-06-07 DIAGNOSIS — J219 Acute bronchiolitis, unspecified: Secondary | ICD-10-CM | POA: Diagnosis not present

## 2022-06-07 DIAGNOSIS — J441 Chronic obstructive pulmonary disease with (acute) exacerbation: Secondary | ICD-10-CM

## 2022-06-07 DIAGNOSIS — B3731 Acute candidiasis of vulva and vagina: Secondary | ICD-10-CM | POA: Diagnosis not present

## 2022-06-07 DIAGNOSIS — J340 Abscess, furuncle and carbuncle of nose: Secondary | ICD-10-CM

## 2022-06-07 MED ORDER — FLUCONAZOLE 150 MG PO TABS: 150.0000 mg | ORAL_TABLET | Freq: Once | ORAL | 0 refills | Status: AC

## 2022-06-07 MED ORDER — FLUCONAZOLE 150 MG PO TABS: 150.0000 mg | ORAL_TABLET | Freq: Once | ORAL | 0 refills | Status: DC

## 2022-06-07 MED ORDER — LEVOFLOXACIN 750 MG PO TABS: 750.0000 mg | ORAL_TABLET | Freq: Every day | ORAL | 0 refills | Status: DC

## 2022-06-07 NOTE — Progress Notes (Signed)
Virtual Visit via Video Note  I connected with Kathryn Friedman  on 06/07/22 at  1:40 PM EDT by a video enabled telemedicine application and verified that I am speaking with the correct person using two identifiers.  Location patient: [name] Location provider:work or home office Persons participating in the virtual visit: patient, provider  I discussed the limitations and requested verbal permission for telemedicine visit. The patient expressed understanding and agreed to proceed.   HPI:  Acute telemedicine visit for : COPD/?ILD/bronchiolitis on breztri and venotolin she is still smoking sx's since Monday chest tightness and feels raw in the chest denies anginal sx's, slight cough no phelgm took expired covid test 06/05/22 neg tried otc mucinex, and amoxicillin from dentist had but stopped due to diarrhea and she just completed steroids this week by ortho for right thumb pain Monday/Tuesday. +sob denies fever  -Pertinent medication allergies: Allergies  Allergen Reactions   Varenicline Other (See Comments)    "I got really depressed" "I got really depressed" CHANTEX   Varenicline Tartrate    Jardiance [Empagliflozin] Other (See Comments)    Yeast infection   Metformin And Related Other (See Comments)    Diarrhea, even with XR   Methylprednisolone Nausea Only and Nausea And Vomiting   -COVID-19 vaccine status:  Immunization History  Administered Date(s) Administered   Fluad Quad(high Dose 65+) 10/04/2021   Influenza,inj,Quad PF,6+ Mos 09/23/2013, 09/09/2014, 08/01/2015, 08/06/2016, 08/14/2017, 08/14/2018, 09/16/2019, 07/13/2020   Influenza-Unspecified 10/24/2011   Moderna Sars-Covid-2 Vaccination 01/25/2022   PFIZER(Purple Top)SARS-COV-2 Vaccination 02/23/2020, 02/09/2021   Pneumococcal Polysaccharide-23 09/23/2013   Td 04/21/2018   Zoster Recombinat (Shingrix) 05/06/2022     ROS: See pertinent positives and negatives per HPI.  Past Medical History:  Diagnosis Date   Anemia     Anginal pain (HCC)    Anxiety    Arthritis    back and knees   Asthma    Bilateral carotid artery stenosis    Blood in stool    Chronic diarrhea    COPD (chronic obstructive pulmonary disease) (HCC)    Coronary artery disease    a.) 75% pRCA; 3.5 x 28 mm Cypher DES placed on 07/24/2006   Current use of long term anticoagulation    Clopidogrel   Depression    secondary to the death of her husband (died 79)   Diverticulitis    Diverticulosis    Dizzinesses    Dysphagia    Dyspnea    Fatty infiltration of liver    GERD (gastroesophageal reflux disease)    Headache    History of 2019 novel coronavirus disease (COVID-19) 12/09/2020   History of 2019 novel coronavirus disease (COVID-19) 12/20/2020   Hypertension    Hypertriglyceridemia    ILD (interstitial lung disease) ([place])    Lump in the abdomen    OSA on CPAP    Overactive bladder    PSVT (paroxysmal supraventricular tachycardia) (HCC)    Spastic colon    T2DM (type 2 diabetes mellitus) ([place]) 05/2008   Tobacco abuse    Venous insufficiency of both lower extremities     Past Surgical History:  Procedure Laterality Date   ABDOMINAL HYSTERECTOMY  with left ovary in place [facility] Left 10/14/2017   calcs bx, fibrosis giant cell reaction and chronic inflammation, negative for malignancy.    CATARACT EXTRACTION, BILATERAL     CESAREAN SECTION  1984   CHOLECYSTECTOMY  1985   COLONOSCOPY WITH PROPOFOL N/A 09/13/2016  Procedure: COLONOSCOPY WITH PROPOFOL;  Surgeon: Manya Silvas, MD;  Location: [facility] ENDOSCOPY;  Service: Endoscopy;  Laterality: N/A;   COLONOSCOPY WITH PROPOFOL N/A 11/09/2018   Procedure: COLONOSCOPY WITH PROPOFOL;  Surgeon: Manya Silvas, MD;  Location: [facility] ENDOSCOPY;  Service: Endoscopy;  Laterality: N/A;   COLONOSCOPY WITH PROPOFOL N/A 03/29/2020   Procedure: COLONOSCOPY WITH PROPOFOL;  Surgeon: Robert Bellow, MD;  Location: [facility] ENDOSCOPY;  Service:  Endoscopy;  Laterality: N/A;   CORONARY ANGIOPLASTY WITH STENT PLACEMENT N/A 07/24/2006   75% pRCA; 3.5 x 28 mm Cypher DES placed; Location: [place]; Surgeons: Katrine Coho, MD   ESOPHAGOGASTRODUODENOSCOPY (EGD) WITH PROPOFOL N/A 02/02/2018   Procedure: ESOPHAGOGASTRODUODENOSCOPY (EGD) WITH PROPOFOL;  Surgeon: Manya Silvas, MD;  Location: [facility] ENDOSCOPY;  Service: Endoscopy;  Laterality: N/A;   ESOPHAGOGASTRODUODENOSCOPY (EGD) WITH PROPOFOL N/A 03/29/2020   Procedure: ESOPHAGOGASTRODUODENOSCOPY (EGD) WITH PROPOFOL;  Surgeon: Robert Bellow, MD;  Location: [facility] ENDOSCOPY;  Service: Endoscopy;  Laterality: N/A;   EYE SURGERY     JOINT REPLACEMENT     bilateral knee replacements   KNEE ARTHROSCOPY  Arthroscopic left knee surgery    KNEE SURGERY  status post knee surgey    LEFT HEART CATH AND CORONARY ANGIOGRAPHY Left 05/14/2018   Procedure: LEFT HEART CATH AND CORONARY ANGIOGRAPHY;  Surgeon: Corey Skains, MD;  Location: [place] CV LAB;  Service: Cardiovascular;  Laterality: Left;   REPLACEMENT TOTAL KNEE  ([facility])   SHOULDER SURGERY  shoulder operation secondary to a torn tendon   TOTAL HIP ARTHROPLASTY Left 06/12/2021   Procedure: TOTAL HIP ARTHROPLASTY;  Surgeon: Corky Mull, MD;  Location: [facility] ORS;  Service: Orthopedics;  Laterality: Left;     Current Outpatient Medications:    Accu-Chek Softclix Lancets lancets, Use as instructed, Disp: 100 each, Rfl: 12   acetaminophen (TYLENOL) 325 MG tablet, Take 650 mg by mouth every 6 (six) hours as needed., Disp: , Rfl:    albuterol (VENTOLIN HFA) 108 (90 Base) MCG/ACT inhaler, INHALE 2 PUFFS FOUR TIMES A DAY, Disp: 8.5 g, Rfl: 11   amLODipine (NORVASC) 10 MG tablet, TAKE 1 TABLET EVERY DAY, Disp: 90 tablet, Rfl: 0   apixaban (ELIQUIS) 5 MG TABS tablet, Take 1 tablet (5 mg total) by mouth 2 (two) times daily., Disp: 180 tablet, Rfl: 0   Blood Glucose Monitoring Suppl (TRUE METRIX METER) w/Device KIT, , Disp: , Rfl:     Budeson-Glycopyrrol-Formoterol (BREZTRI AEROSPHERE) 160-9-4.8 MCG/ACT AERO, Inhale 2 puffs into the lungs in the morning and at bedtime., Disp: 32.1 g, Rfl: 3   CALCIUM PO, Take 600 mg by mouth daily. , Disp: , Rfl:    cholecalciferol (VITAMIN D3) 25 MCG (1000 UNIT) tablet, Take 1,000 Units by mouth daily., Disp: , Rfl:    Cyanocobalamin (VITAMIN B-12 PO), Take 2,500 mcg by mouth daily., Disp: , Rfl:    DULoxetine (CYMBALTA) 60 MG capsule, Take 1 capsule (60 mg total) by mouth daily., Disp: 90 capsule, Rfl: 1   famotidine (PEPCID) 40 MG tablet, TAKE 1 TABLET EVERY DAY, Disp: 90 tablet, Rfl: 1   HM MELATONIN PO, 5 mg by Other route at bedtime as needed. patch, Disp: , Rfl:    isosorbide mononitrate (IMDUR) 30 MG 24 hr tablet, Take 1 tablet (30 mg total) by mouth daily., Disp: 90 tablet, Rfl: 0   levofloxacin (LEVAQUIN) 750 MG tablet, Take 1 tablet (750 mg total) by mouth daily. With food. Please deliver, Disp: 7 tablet, Rfl: 0   Multiple Vitamin (  MULTIVITAMIN) tablet, Take 1 tablet by mouth daily., Disp: , Rfl:    mupirocin ointment (BACTROBAN) 2 %, Apply 1 application. topically 2 (two) times daily., Disp: 22 g, Rfl: 0   nystatin (MYCOSTATIN/NYSTOP) powder, APPLY TO THE AFFECTED AREA(S) TWICE DAILY, Disp: 30 g, Rfl: 0   pantoprazole (PROTONIX) 40 MG tablet, TAKE 1 TABLET TWICE DAILY BEFORE MEALS, Disp: 180 tablet, Rfl: 1   propranolol ER (INDERAL LA) 80 MG 24 hr capsule, Take 1 capsule (80 mg total) by mouth 2 (two) times daily., Disp: 180 capsule, Rfl: 1   QUEtiapine (SEROQUEL) 50 MG tablet, Take 1 tablet (50 mg total) by mouth at bedtime., Disp: 90 tablet, Rfl: 0   rOPINIRole (REQUIP) 2 MG tablet, TAKE 1 TABLET AT BEDTIME (DOSE CHANGE), Disp: 30 tablet, Rfl: 1   rosuvastatin (CRESTOR) 20 MG tablet, TAKE 1 TABLET EVERY DAY, Disp: 90 tablet, Rfl: 0   Semaglutide,0.25 or 0.5MG/DOS, (OZEMPIC, 0.25 OR 0.5 MG/DOSE,) 2 MG/1.5ML SOPN, Inject 0.25 mg weekly for 4 weeks then increase to 0.5 mg weekly, Disp:  1.5 mL, Rfl: 2   telmisartan (MICARDIS) 80 MG tablet, TAKE 1 TABLET EVERY DAY, Disp: 90 tablet, Rfl: 1   trimethoprim (TRIMPEX) 100 MG tablet, Take 1 tablet (100 mg total) by mouth daily., Disp: 90 tablet, Rfl: 3   TRUE METRIX BLOOD GLUCOSE TEST test strip, TEST BLOOD SUGAR TWICE DAILY AS DIRECTED, Disp: 200 strip, Rfl: 1   Vibegron (GEMTESA) 75 MG TABS, Take 75 mg by mouth daily., Disp: 90 tablet, Rfl: 3   fluconazole (DIFLUCAN) 150 MG tablet, Take 1 tablet (150 mg total) by mouth once for 1 dose. Please deliver, Disp: 1 tablet, Rfl: 0  EXAM:  VITALS per patient if applicable:  GENERAL: alert, oriented, appears well and in no acute distress  HEENT: atraumatic, conjunttiva clear, no obvious abnormalities on inspection of external nose and ears  NECK: normal movements of the head and neck  LUNGS: on inspection no signs of respiratory distress, breathing rate appears normal, no obvious gross SOB, gasping or wheezing  CV: no obvious cyanosis  MS: moves all visible extremities without noticeable abnormality  PSYCH/NEURO: pleasant and cooperative, no obvious depression or anxiety, speech and thought processing grossly intact  ASSESSMENT AND PLAN:  Discussed the following assessment and plan:  COPD exacerbation (HCC) - Plan: levofloxacin (LEVAQUIN) 750 MG tablet 5-7 days  Pulmonary emphysema, unspecified emphysema type (HCC) - Plan: levofloxacin (LEVAQUIN) 750 MG tablet  Bronchiolitis - Plan: levofloxacin (LEVAQUIN) 750 MG tablet  Yeast vaginitis - Plan: fluconazole (DIFLUCAN) 150 MG tablet, DISCONTINUED: fluconazole (DIFLUCAN) 150 MG tablet  -we discussed possible serious and likely etiologies, options for evaluation and workup, limitations of telemedicine visit vs in person visit, treatment, treatment risks and precautions. Pt is agreeable to treatment via telemedicine at this moment.     I discussed the assessment and treatment plan with the patient. The patient was provided an  opportunity to ask questions and all were answered. The patient agreed with the plan and demonstrated an understanding of the instructions.    Time spent 20 min Delorise Jackson, MD

## 2022-06-10 ENCOUNTER — Ambulatory Visit: Payer: Medicare HMO | Admitting: Physician Assistant

## 2022-06-10 ENCOUNTER — Encounter: Payer: Self-pay | Admitting: Physician Assistant

## 2022-06-10 DIAGNOSIS — N3281 Overactive bladder: Secondary | ICD-10-CM | POA: Diagnosis not present

## 2022-06-10 NOTE — Patient Instructions (Signed)
Tracking Your Bladder Symptoms    Patient Name:___________________________________________________   Sample: Day   Daytime Voids  Nighttime Voids Urgency for the Day(0-4) Number of Accidents Beverage Comments  Monday IIII II 2 I Water IIII Coffee  I      Week Starting:____________________________________   Day Daytime  Voids Nighttime  Voids Urgency for  The Day(0-4) Number of Accidents Beverages Comments                                                           This week my symptoms were:  O much better  O better O the same O worse   

## 2022-06-10 NOTE — Progress Notes (Signed)
PTNS  Session # 9  Health & Social Factors: no change Caffeine: 2  Alcohol: 0 Daytime voids #per day: 8 Night-time voids #per night: 2 Urgency: strong Incontinence Episodes #per day: 0-1 Ankle used: right Treatment Setting: 7 Feeling/ Response: sensory Comments: patient tolerated well  Performed By: Elberta Leatherwood, CMA  Follow Up: 1 week #10

## 2022-06-13 ENCOUNTER — Encounter: Payer: Self-pay | Admitting: Internal Medicine

## 2022-06-13 ENCOUNTER — Telehealth: Payer: Self-pay | Admitting: Internal Medicine

## 2022-06-13 NOTE — Telephone Encounter (Signed)
Pt advised readings ok, keep sending

## 2022-06-13 NOTE — Telephone Encounter (Signed)
Pt advised.

## 2022-06-13 NOTE — Telephone Encounter (Signed)
Patient returned call

## 2022-06-13 NOTE — Telephone Encounter (Signed)
Please call and notify her that her blood pressures look good.  Sugars ok.

## 2022-06-13 NOTE — Telephone Encounter (Signed)
Lm for pt to cb.

## 2022-06-14 ENCOUNTER — Ambulatory Visit
Admission: RE | Admit: 2022-06-14 | Discharge: 2022-06-14 | Disposition: A | Discharge status: Home / Self Care | Payer: Medicare HMO | Source: Ambulatory Visit | Attending: Internal Medicine | Admitting: Internal Medicine

## 2022-06-14 DIAGNOSIS — F17200 Nicotine dependence, unspecified, uncomplicated: Secondary | ICD-10-CM | POA: Diagnosis not present

## 2022-06-14 DIAGNOSIS — I6529 Occlusion and stenosis of unspecified carotid artery: Secondary | ICD-10-CM | POA: Diagnosis not present

## 2022-06-14 DIAGNOSIS — R490 Dysphonia: Secondary | ICD-10-CM | POA: Diagnosis not present

## 2022-06-14 DIAGNOSIS — R221 Localized swelling, mass and lump, neck: Secondary | ICD-10-CM | POA: Diagnosis not present

## 2022-06-14 DIAGNOSIS — M47812 Spondylosis without myelopathy or radiculopathy, cervical region: Secondary | ICD-10-CM | POA: Diagnosis not present

## 2022-06-14 LAB — POCT I-STAT CREATININE: Creatinine, Ser: 0.8 mg/dL (ref 0.44–1.00)

## 2022-06-14 MED ORDER — IOHEXOL 300 MG/ML  SOLN
75.0000 mL | Freq: Once | INTRAMUSCULAR | Status: AC | PRN
  Administered 2022-06-14: 75 mL via INTRAVENOUS

## 2022-06-17 ENCOUNTER — Other Ambulatory Visit (INDEPENDENT_AMBULATORY_CARE_PROVIDER_SITE_OTHER): Payer: Medicare HMO

## 2022-06-17 ENCOUNTER — Other Ambulatory Visit: Payer: Medicare HMO

## 2022-06-17 ENCOUNTER — Ambulatory Visit: Payer: Medicare Other | Admitting: Physician Assistant

## 2022-06-17 DIAGNOSIS — N3281 Overactive bladder: Secondary | ICD-10-CM | POA: Diagnosis not present

## 2022-06-17 DIAGNOSIS — E1159 Type 2 diabetes mellitus with other circulatory complications: Secondary | ICD-10-CM | POA: Diagnosis not present

## 2022-06-17 DIAGNOSIS — E7800 Pure hypercholesterolemia, unspecified: Secondary | ICD-10-CM

## 2022-06-17 LAB — LIPID PANEL
Cholesterol: 114 mg/dL (ref 0–200)
HDL: 49.7 mg/dL (ref >39.00)
LDL Cholesterol: 46 mg/dL (ref 0–99)
NonHDL: 64.34
Total CHOL/HDL Ratio: 2
Triglycerides: 94 mg/dL (ref 0.0–149.0)
VLDL: 18.8 mg/dL (ref 0.0–40.0)

## 2022-06-17 LAB — CBC WITH DIFFERENTIAL/PLATELET
Basophils Absolute: 0.1 10*3/uL (ref 0.0–0.1)
Basophils Relative: 0.7 % (ref 0.0–3.0)
Eosinophils Absolute: 0.2 10*3/uL (ref 0.0–0.7)
Eosinophils Relative: 1.8 % (ref 0.0–5.0)
HCT: 43.6 % (ref 36.0–46.0)
Hemoglobin: 14.8 g/dL (ref 12.0–15.0)
Lymphocytes Relative: 20.1 % (ref 12.0–46.0)
Lymphs Abs: 1.9 10*3/uL (ref 0.7–4.0)
MCHC: 34 g/dL (ref 30.0–36.0)
MCV: 93.6 fl (ref 78.0–100.0)
Monocytes Absolute: 0.4 10*3/uL (ref 0.1–1.0)
Monocytes Relative: 4.4 % (ref 3.0–12.0)
Neutro Abs: 6.7 10*3/uL (ref 1.4–7.7)
Neutrophils Relative %: 73 % (ref 43.0–77.0)
Platelets: 131 10*3/uL — ABNORMAL LOW (ref 150.0–400.0)
RBC: 4.65 Mil/uL (ref 3.87–5.11)
RDW: 17.6 % — ABNORMAL HIGH (ref 11.5–15.5)
WBC: 9.2 10*3/uL (ref 4.0–10.5)

## 2022-06-17 LAB — BASIC METABOLIC PANEL
BUN: 9 mg/dL (ref 6–23)
CO2: 26 mEq/L (ref 19–32)
Calcium: 9 mg/dL (ref 8.4–10.5)
Chloride: 101 mEq/L (ref 96–112)
Creatinine, Ser: 0.77 mg/dL (ref 0.40–1.20)
GFR: 80.46 mL/min (ref >60.00)
Glucose, Bld: 143 mg/dL — ABNORMAL HIGH (ref 70–99)
Potassium: 4.1 mEq/L (ref 3.5–5.1)
Sodium: 136 mEq/L (ref 135–145)

## 2022-06-17 LAB — HEPATIC FUNCTION PANEL
ALT: 11 U/L (ref 0–35)
AST: 11 U/L (ref 0–37)
Albumin: 3.9 g/dL (ref 3.5–5.2)
Alkaline Phosphatase: 67 U/L (ref 39–117)
Bilirubin, Direct: 0.1 mg/dL (ref 0.0–0.3)
Total Bilirubin: 0.4 mg/dL (ref 0.2–1.2)
Total Protein: 6.2 g/dL (ref 6.0–8.3)

## 2022-06-17 LAB — HEMOGLOBIN A1C: Hgb A1c MFr Bld: 7 % — ABNORMAL HIGH (ref 4.6–6.5)

## 2022-06-17 LAB — TSH: TSH: 1.23 u[IU]/mL (ref 0.35–5.50)

## 2022-06-17 NOTE — Patient Instructions (Signed)
Tracking Your Bladder Symptoms    Patient Name:___________________________________________________   Sample: Day   Daytime Voids  Nighttime Voids Urgency for the Day(0-4) Number of Accidents Beverage Comments  Monday IIII II 2 I Water IIII Coffee  I      Week Starting:____________________________________   Day Daytime  Voids Nighttime  Voids Urgency for  The Day(0-4) Number of Accidents Beverages Comments                                                           This week my symptoms were:  O much better  O better O the same O worse   

## 2022-06-17 NOTE — Progress Notes (Signed)
PTNS  Session # 10  Health & Social Factors: COPD/Vaginal yeast infection  Caffeine: 2 Alcohol: 0 Daytime voids #per day: 8 Night-time voids #per night: 2 Urgency: Mild/None Incontinence Episodes #per day: 0 Ankle used: Right Treatment Setting: 8 Feeling/ Response: Sensory & Toe Flex Comments: Patient notes improvement in incontinence   Performed By: Gordy Clement, CMA   Follow Up: RTC in 1 week for PTNS

## 2022-06-18 ENCOUNTER — Other Ambulatory Visit: Payer: Self-pay

## 2022-06-18 ENCOUNTER — Telehealth: Payer: Self-pay

## 2022-06-18 ENCOUNTER — Encounter: Payer: Self-pay | Admitting: Internal Medicine

## 2022-06-18 DIAGNOSIS — D696 Thrombocytopenia, unspecified: Secondary | ICD-10-CM

## 2022-06-18 NOTE — Telephone Encounter (Signed)
Kathryn Friedman spoke with patient. See result note.

## 2022-06-18 NOTE — Telephone Encounter (Signed)
Pt advised Ozempic at office ready for pick up

## 2022-06-19 ENCOUNTER — Encounter: Payer: Self-pay | Admitting: Internal Medicine

## 2022-06-19 ENCOUNTER — Other Ambulatory Visit: Payer: Self-pay | Admitting: Internal Medicine

## 2022-06-19 ENCOUNTER — Telehealth: Payer: Medicare HMO | Admitting: Internal Medicine

## 2022-06-20 NOTE — Telephone Encounter (Signed)
Please call and notify that their are a few days where blood sugar is elevated.  Have her continue low carb diet.  Monitor carb intake.  Continue to spot check sugars.  We will follow.  Blood pressures overall ok.

## 2022-06-20 NOTE — Telephone Encounter (Signed)
Per Kathryn Friedman pt advised Pt had no questions, understood

## 2022-06-20 NOTE — Telephone Encounter (Signed)
Lm for pt to cb.

## 2022-06-21 ENCOUNTER — Encounter: Payer: Self-pay | Admitting: Internal Medicine

## 2022-06-24 ENCOUNTER — Telehealth (INDEPENDENT_AMBULATORY_CARE_PROVIDER_SITE_OTHER): Payer: Medicare HMO | Admitting: Internal Medicine

## 2022-06-24 ENCOUNTER — Ambulatory Visit: Payer: Medicaid Other | Admitting: Physician Assistant

## 2022-06-24 ENCOUNTER — Encounter: Payer: Self-pay | Admitting: Internal Medicine

## 2022-06-24 DIAGNOSIS — F331 Major depressive disorder, recurrent, moderate: Secondary | ICD-10-CM | POA: Diagnosis not present

## 2022-06-24 DIAGNOSIS — I10 Essential (primary) hypertension: Secondary | ICD-10-CM | POA: Diagnosis not present

## 2022-06-24 DIAGNOSIS — E1159 Type 2 diabetes mellitus with other circulatory complications: Secondary | ICD-10-CM

## 2022-06-24 DIAGNOSIS — J449 Chronic obstructive pulmonary disease, unspecified: Secondary | ICD-10-CM

## 2022-06-24 NOTE — Telephone Encounter (Signed)
Please call her and confirm doing ok.  Also confirm if she has notified psychiatry.  Does she feel she needs an appt to discuss? Confirm no suicidal ideations.
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
## 2022-06-24 NOTE — Progress Notes (Signed)
BS reading this morning was 210 after drinking 2 cups of coffee.

## 2022-06-25 NOTE — Assessment & Plan Note (Signed)
Continue micardis '80mg'$  q day and amlodipine '10mg'$  q day.  Dr Rockey Situ increased propranolol. Blood pressures reviewed.  Blood pressure recently a little elevated.  Feel related to increased stress.  Hold on making changes.   Follow pressures.  Follow metabolic panel.

## 2022-06-25 NOTE — Assessment & Plan Note (Signed)
Sees Dr Fleming.  Using breztri.  Breathing stable.  No increased sob, cough or congestion.  

## 2022-06-25 NOTE — Assessment & Plan Note (Signed)
On ozempic.  Tolerating.  Blood sugars as outlined.  Low carb diet and exercise.  Follow met b and a1c.

## 2022-06-25 NOTE — Assessment & Plan Note (Signed)
Being followed by psychiatry.  On cymbalta.  Increased depression related to recent passing of her dog. Discussed with her today.  Discussed f/u with psychiatry.  She assures me she will not hurt herself. Denies SI.  Follow closely. Call with update.Marland Kitchen

## 2022-06-26 ENCOUNTER — Encounter: Payer: Self-pay | Admitting: Internal Medicine

## 2022-06-26 NOTE — Telephone Encounter (Signed)
Please call her and thank  her for the update.  Please confirm she is doing ok.  Have her let us know if need anything.

## 2022-06-26 NOTE — Telephone Encounter (Signed)
S/w pt - stated she is doing a bit better now.  Happy she has her babies ashes home, and she feels like Mel Almond is with her now. Gave patient our love and told her that if she needs anything to reach out to [place], we are always here for her. Pt wanted to thank you for always being there for her, and for everything you do for her.

## 2022-06-27 NOTE — Telephone Encounter (Signed)
Blood pressures look good.  Sugars are a little elevated, but I feel this may be related to her increased stress.  The last day improved.  We will continue to follow.

## 2022-06-28 ENCOUNTER — Encounter: Payer: Self-pay | Admitting: Internal Medicine

## 2022-06-28 DIAGNOSIS — Z96652 Presence of left artificial knee joint: Secondary | ICD-10-CM | POA: Diagnosis not present

## 2022-06-28 DIAGNOSIS — M19049 Primary osteoarthritis, unspecified hand: Secondary | ICD-10-CM | POA: Diagnosis not present

## 2022-06-30 ENCOUNTER — Other Ambulatory Visit: Payer: Self-pay | Admitting: Internal Medicine

## 2022-06-30 DIAGNOSIS — I480 Paroxysmal atrial fibrillation: Secondary | ICD-10-CM

## 2022-07-01 ENCOUNTER — Ambulatory Visit: Payer: Medicare HMO | Admitting: Physician Assistant

## 2022-07-01 ENCOUNTER — Other Ambulatory Visit (INDEPENDENT_AMBULATORY_CARE_PROVIDER_SITE_OTHER): Payer: Medicare HMO

## 2022-07-01 DIAGNOSIS — D696 Thrombocytopenia, unspecified: Secondary | ICD-10-CM | POA: Diagnosis not present

## 2022-07-01 DIAGNOSIS — N3281 Overactive bladder: Secondary | ICD-10-CM

## 2022-07-01 LAB — CBC WITH DIFFERENTIAL/PLATELET
Basophils Absolute: 0 10*3/uL (ref 0.0–0.1)
Basophils Relative: 0.3 % (ref 0.0–3.0)
Eosinophils Absolute: 0.1 10*3/uL (ref 0.0–0.7)
Eosinophils Relative: 1.5 % (ref 0.0–5.0)
HCT: 42.8 % (ref 36.0–46.0)
Hemoglobin: 14.5 g/dL (ref 12.0–15.0)
Lymphocytes Relative: 19.1 % (ref 12.0–46.0)
Lymphs Abs: 1.8 10*3/uL (ref 0.7–4.0)
MCHC: 33.8 g/dL (ref 30.0–36.0)
MCV: 94.3 fl (ref 78.0–100.0)
Monocytes Absolute: 0.4 10*3/uL (ref 0.1–1.0)
Monocytes Relative: 4.6 % (ref 3.0–12.0)
Neutro Abs: 7 10*3/uL (ref 1.4–7.7)
Neutrophils Relative %: 74.5 % (ref 43.0–77.0)
Platelets: 176 10*3/uL (ref 150.0–400.0)
RBC: 4.54 Mil/uL (ref 3.87–5.11)
RDW: 16.9 % — ABNORMAL HIGH (ref 11.5–15.5)
WBC: 9.4 10*3/uL (ref 4.0–10.5)

## 2022-07-01 NOTE — Progress Notes (Signed)
PTNS  Session # 11  Health & Social Factors: No Change Caffeine: 2 Alcohol: 0 Daytime voids #per day: 9 Night-time voids #per night: 2 Urgency: Severe  Incontinence Episodes #per day: 1 Ankle used: Left Treatment Setting: 10 Feeling/ Response: Sensory & Toe Flex Comments: Patient with increase in night time voids, incontinence episodes, and Urgency.  Performed By: Gordy Clement, CMA   Follow Up: RTC in 1 week for PTNS

## 2022-07-01 NOTE — Patient Instructions (Signed)
Tracking Your Bladder Symptoms    Patient Name:___________________________________________________   Sample: Day   Daytime Voids  Nighttime Voids Urgency for the Day(0-4) Number of Accidents Beverage Comments  Monday IIII II 2 I Water IIII Coffee  I      Week Starting:____________________________________   Day Daytime  Voids Nighttime  Voids Urgency for  The Day(0-4) Number of Accidents Beverages Comments                                                           This week my symptoms were:  O much better  O better O the same O worse   

## 2022-07-02 ENCOUNTER — Telehealth: Payer: Self-pay

## 2022-07-02 DIAGNOSIS — G2581 Restless legs syndrome: Secondary | ICD-10-CM

## 2022-07-02 MED ORDER — ROPINIROLE HCL 3 MG PO TABS: 3.0000 mg | ORAL_TABLET | Freq: Every day | ORAL | 1 refills | Status: DC

## 2022-07-02 NOTE — Telephone Encounter (Signed)
I have increased the requip  o 3 mg at bedtime for now .  However please let her know that she may need a dose reduction of her Seroquel if she is having restless legs at night since Seroquel can make her symptoms worse.  She could cut the Seroquel into half or we can send a 25 mg to the pharmacy if she is interested.  Please let me know.

## 2022-07-02 NOTE — Telephone Encounter (Signed)
pt called states she wants to increase the ropinrole she states that the '2mg'$  is not working and she wanted to know if she can go up on the medication .

## 2022-07-03 ENCOUNTER — Encounter: Payer: Self-pay | Admitting: Internal Medicine

## 2022-07-03 ENCOUNTER — Telehealth: Payer: Self-pay | Admitting: Cardiovascular Disease

## 2022-07-03 NOTE — Telephone Encounter (Signed)
pt was called and given the instruction per dr. Shea Evans order.

## 2022-07-03 NOTE — Telephone Encounter (Signed)
I will send a message to the pt through [place] that we still have not received a clearance request from the surgeon office. Please have clearance request faxed to [phone] attn pre op team

## 2022-07-03 NOTE — Telephone Encounter (Signed)
Please ask her to increase the requip as changed yesterday , sent to pharmacy and give it a few days. Please check when her next visit is and schedule sooner if needs medication changes . Thank you

## 2022-07-03 NOTE — Telephone Encounter (Signed)
pt states that when she went on the '25mg'$  of the seroquel she wasn't able to sleep.  So is there anything else that can be done.

## 2022-07-03 NOTE — Telephone Encounter (Signed)
Pt states she is having surgery on her right hand next week by the [facility] clinic and they are requesting clearance. She said that they are going to send a fax over to our office.

## 2022-07-04 ENCOUNTER — Telehealth: Payer: Self-pay | Admitting: *Deleted

## 2022-07-04 ENCOUNTER — Other Ambulatory Visit: Payer: Self-pay | Admitting: Surgery

## 2022-07-04 ENCOUNTER — Ambulatory Visit (INDEPENDENT_AMBULATORY_CARE_PROVIDER_SITE_OTHER): Payer: Medicare HMO | Admitting: Physician Assistant

## 2022-07-04 DIAGNOSIS — Z01810 Encounter for preprocedural cardiovascular examination: Secondary | ICD-10-CM | POA: Diagnosis not present

## 2022-07-04 DIAGNOSIS — Z20822 Contact with and (suspected) exposure to covid-19: Secondary | ICD-10-CM | POA: Diagnosis not present

## 2022-07-04 NOTE — Telephone Encounter (Signed)
Per pre op provider urgent add on to preop schedule due to blood thinner hold.   Pt agreeable to appt 4:40 pm today. Med rec and consent are done.

## 2022-07-04 NOTE — Telephone Encounter (Signed)
Per pre op provider urgent add on to preop schedule due to blood thinner hold.  Pt agreeable to appt 4:40 pm today. Med rec and consent are done.     Patient Consent for Virtual Visit        Kathryn Friedman has provided verbal consent on 07/04/2022 for a virtual visit (video or telephone).   CONSENT FOR VIRTUAL VISIT FOR:  Kathryn Friedman  By participating in this virtual visit I agree to the following:  I hereby voluntarily request, consent and authorize [facility] and its employed or contracted physicians, physician assistants, nurse practitioners or other licensed health care professionals (the Practitioner), to provide me with telemedicine health care services (the "Services") as deemed necessary by the treating Practitioner. I acknowledge and consent to receive the Services by the Practitioner via telemedicine. I understand that the telemedicine visit will involve communicating with the Practitioner through live audiovisual communication technology and the disclosure of certain medical information by electronic transmission. I acknowledge that I have been given the opportunity to request an in-person assessment or other available alternative prior to the telemedicine visit and am voluntarily participating in the telemedicine visit.  I understand that I have the right to withhold or withdraw my consent to the use of telemedicine in the course of my care at any time, without affecting my right to future care or treatment, and that the Practitioner or I may terminate the telemedicine visit at any time. I understand that I have the right to inspect all information obtained and/or recorded in the course of the telemedicine visit and may receive copies of available information for a reasonable fee.  I understand that some of the potential risks of receiving the Services via telemedicine include:  Delay or interruption in medical evaluation due to technological equipment failure or  disruption; Information transmitted may not be sufficient (e.g. poor resolution of images) to allow for appropriate medical decision making by the Practitioner; and/or  In rare instances, security protocols could fail, causing a breach of personal health information.  Furthermore, I acknowledge that it is my responsibility to provide information about my medical history, conditions and care that is complete and accurate to the best of my ability. I acknowledge that Practitioner's advice, recommendations, and/or decision may be based on factors not within their control, such as incomplete or inaccurate data provided by me or distortions of diagnostic images or specimens that may result from electronic transmissions. I understand that the practice of medicine is not an exact science and that Practitioner makes no warranties or guarantees regarding treatment outcomes. I acknowledge that a copy of this consent can be made available to me via my patient portal ([place]), or I can request a printed copy by calling the office of [facility].    I understand that my insurance will be billed for this visit.   I have read or had this consent read to me. I understand the contents of this consent, which adequately explains the benefits and risks of the Services being provided via telemedicine.  I have been provided ample opportunity to ask questions regarding this consent and the Services and have had my questions answered to my satisfaction. I give my informed consent for the services to be provided through the use of telemedicine in my medical care

## 2022-07-04 NOTE — Telephone Encounter (Signed)
Patient with diagnosis of A Fib on Eliquis for anticoagulation.    Procedure: R [facility]  suspension arthroplasty Date of procedure: 07/10/22   CHA2DS2-VASc Score = 5  This indicates a 7.2% annual risk of stroke. The patient's score is based upon: CHF History: 0 HTN History: 1 Diabetes History: 1 Stroke History: 0 Vascular Disease History: 1 Age Score: 1 Gender Score: 1   CrCl 79 mL/min Platelet count 176K   Per office protocol, patient can hold Eliquis for 2 days prior to procedure.    Recommend patient hold Ozempic for 7 days before procedure  **This guidance is not considered finalized until pre-operative APP has relayed final recommendations.**

## 2022-07-04 NOTE — Telephone Encounter (Signed)
I s/w Kathryn Friedman with Dr. Nicholaus Bloom office and stated that I still have not received a clearance request and that I will need a form sent over. Pt has stated that her surgery is 07/10/22. Left a message with Loree Fee to have surgery scheduler call back [phone] ok to leave vm .

## 2022-07-04 NOTE — Telephone Encounter (Signed)
Notify - blood sugars ok.  Blood pressure varying.  I would like to give this a little more time and see if levels out.  Continue to monitor.

## 2022-07-04 NOTE — Telephone Encounter (Signed)
Will route to pharm then pt will need expedited VV given very short notice.

## 2022-07-04 NOTE — Telephone Encounter (Signed)
   Pre-operative Risk Assessment    Patient Name: Kathryn Friedman  DOB: 1956/11/04 MRN: [id number]      Request for Surgical Clearance    Procedure:   R [facility]  suspension arthroplasty  Date of Surgery:  Clearance 07/10/22                                 Surgeon:  Roland Rack Surgeon's Group or Practice Name:  [facility] Phone number:  [phone] Fax number:  [phone]   Type of Clearance Requested:   - Medical  - Pharmacy:  [facility] please  advise   Type of Anesthesia:  Not Indicated   Additional requests/questions:    Jonathon Jordan   07/04/2022, 1:01 PM

## 2022-07-04 NOTE — Progress Notes (Signed)
Virtual Visit via Telephone Note   Because of CHESNIE CAPELL co-morbid illnesses, she is at least at moderate risk for complications without adequate follow up.  This format is felt to be most appropriate for this patient at this time.  The patient did not have access to video technology/had technical difficulties with video requiring transitioning to audio format only (telephone).  All issues noted in this document were discussed and addressed.  No physical exam could be performed with this format.  Please refer to the patient's chart for her consent to telehealth for [facility].  Evaluation Performed:  Preoperative cardiovascular risk assessment _____________   Date:  07/04/2022   Patient ID:  Sarita, Hakanson 03-02-1956, MRN [id number] Patient Location:  Home Provider location:   Office  Primary Care Provider:  Einar Pheasant, MD Primary Cardiologist:  None  Chief Complaint / Patient Profile   66 y.o. y/o female with a h/o CAD (remote stenting 2007, last cath 2019 with multivessel disease managed medically, PAF, SVT, COPD, HLD, ? H/o DVT, minimal carotid disease 2021 who is pending R [facility]  suspension arthroplasty and presents today for telephonic preoperative cardiovascular risk assessment. No recurrent syncope.  Past Medical History    Past Medical History:  Diagnosis Date   Anemia    Anginal pain (HCC)    Anxiety    Arthritis    back and knees   Asthma    Bilateral carotid artery stenosis    Blood in stool    Chronic diarrhea    COPD (chronic obstructive pulmonary disease) (HCC)    Coronary artery disease    a.) 75% pRCA; 3.5 x 28 mm Cypher DES placed on 07/24/2006   Current use of long term anticoagulation    Clopidogrel   Depression    secondary to the death of her husband (died 76)   Diverticulitis    Diverticulosis    Dizzinesses    Dysphagia    Dyspnea    Fatty infiltration of liver    GERD (gastroesophageal reflux disease)    Headache     History of 2019 novel coronavirus disease (COVID-19) 12/09/2020   History of 2019 novel coronavirus disease (COVID-19) 12/20/2020   Hypertension    Hypertriglyceridemia    ILD (interstitial lung disease) ([place])    Lump in the abdomen    OSA on CPAP    Overactive bladder    PSVT (paroxysmal supraventricular tachycardia) (HCC)    Spastic colon    T2DM (type 2 diabetes mellitus) ([place]) 05/2008   Tobacco abuse    Venous insufficiency of both lower extremities    Past Surgical History:  Procedure Laterality Date   ABDOMINAL HYSTERECTOMY  with left ovary in place [place] Left 10/14/2017   calcs bx, fibrosis giant cell reaction and chronic inflammation, negative for malignancy.    CATARACT EXTRACTION, BILATERAL     CESAREAN SECTION  1984   CHOLECYSTECTOMY  1985   COLONOSCOPY WITH PROPOFOL N/A 09/13/2016   Procedure: COLONOSCOPY WITH PROPOFOL;  Surgeon: Manya Silvas, MD;  Location[facility] ENDOSCOPY;  Service: Endoscopy;  Laterality: N/A;   COLONOSCOPY WITH PROPOFOL N/A 11/09/2018   Procedure: COLONOSCOPY WITH PROPOFOL;  Surgeon: Manya Silvas, MD;  Location: [facility] ENDOSCOPY;  Service: Endoscopy;  Laterality: N/A;   COLONOSCOPY WITH PROPOFOL N/A 03/29/2020   Procedure: COLONOSCOPY WITH PROPOFOL;  Surgeon: Robert Bellow, MD;  Location: [facility] ENDOSCOPY;  Service: Endoscopy;  Laterality: N/A;  CORONARY ANGIOPLASTY WITH STENT PLACEMENT N/A 07/24/2006   75% pRCA; 3.5 x 28 mm Cypher DES placed; Location: [place]; Surgeons: Katrine Coho, MD   ESOPHAGOGASTRODUODENOSCOPY (EGD) WITH PROPOFOL N/A 02/02/2018   Procedure: ESOPHAGOGASTRODUODENOSCOPY (EGD) WITH PROPOFOL;  Surgeon: Manya Silvas, MD;  Location: [facility] ENDOSCOPY;  Service: Endoscopy;  Laterality: N/A;   ESOPHAGOGASTRODUODENOSCOPY (EGD) WITH PROPOFOL N/A 03/29/2020   Procedure: ESOPHAGOGASTRODUODENOSCOPY (EGD) WITH PROPOFOL;  Surgeon: Robert Bellow, MD;  Location: [facility] ENDOSCOPY;  Service:  Endoscopy;  Laterality: N/A;   EYE SURGERY     JOINT REPLACEMENT     bilateral knee replacements   KNEE ARTHROSCOPY  Arthroscopic left knee surgery    KNEE SURGERY  status post knee surgey    LEFT HEART CATH AND CORONARY ANGIOGRAPHY Left 05/14/2018   Procedure: LEFT HEART CATH AND CORONARY ANGIOGRAPHY;  Surgeon: Corey Skains, MD;  Location: [facility] CV LAB;  Service: Cardiovascular;  Laterality: Left;   REPLACEMENT TOTAL KNEE  ([facility])   SHOULDER SURGERY  shoulder operation secondary to a torn tendon   TOTAL HIP ARTHROPLASTY Left 06/12/2021   Procedure: TOTAL HIP ARTHROPLASTY;  Surgeon: Corky Mull, MD;  Location: [facility] ORS;  Service: Orthopedics;  Laterality: Left;    Allergies  Allergies  Allergen Reactions   Varenicline Other (See Comments)    "I got really depressed" "I got really depressed" CHANTEX   Varenicline Tartrate    Jardiance [Empagliflozin] Other (See Comments)    Yeast infection   Metformin And Related Other (See Comments)    Diarrhea, even with XR   Methylprednisolone Nausea Only and Nausea And Vomiting    History of Present Illness    KILYN MARAGH is a 66 y.o. female who presents via audio/video conferencing for a telehealth visit today.  Pt was last seen in cardiology clinic on 05/13/22 by Dr. Rockey Situ.  At that time AMAZIAH GHOSH was felt to be clinically stable with increase in propranolol for brief runs of AF, SVT. 6 month follow-up was recommended. The patient is now pending procedure as outlined above. Since her last visit, she reports she has done well. She reports that she is a widow and takes care of all of her own home duties as well as walking her dog and taking care of her cat. Higher levels of activity are limited by her orthopedic problems but able to achieve >5 METS without angina or dyspnea. Has chronic stable dyspnea with higher levels of activity, which she reports is completely unchanged.   Home Medications    Prior to Admission  medications   Medication Sig Start Date End Date Taking? Authorizing Provider  Accu-Chek Softclix Lancets lancets Use as instructed 04/17/22   Einar Pheasant, MD  acetaminophen (TYLENOL) 325 MG tablet Take 650 mg by mouth every 6 (six) hours as needed.    [provider]  albuterol (VENTOLIN HFA) 108 (90 Base) MCG/ACT inhaler INHALE 2 PUFFS FOUR TIMES A DAY 01/11/22   Einar Pheasant, MD  amLODipine (NORVASC) 10 MG tablet TAKE 1 TABLET EVERY DAY 05/27/22   Einar Pheasant, MD  Blood Glucose Monitoring Suppl (TRUE METRIX METER) w/Device KIT  06/20/20   [provider]  Budeson-Glycopyrrol-Formoterol (BREZTRI AEROSPHERE) 160-9-4.8 MCG/ACT AERO Inhale 2 puffs into the lungs in the morning and at bedtime. 12/06/21   Einar Pheasant, MD  CALCIUM PO Take 600 mg by mouth daily.     [provider]  cholecalciferol (VITAMIN D3) 25 MCG (1000 UNIT) tablet Take 1,000 Units by mouth  daily.    [provider]  Cyanocobalamin (VITAMIN B-12 PO) Take 2,500 mcg by mouth daily.    [provider]  DULoxetine (CYMBALTA) 60 MG capsule Take 1 capsule (60 mg total) by mouth daily. 02/18/22   Ursula Alert, MD  ELIQUIS 5 MG TABS tablet TAKE 1 TABLET TWICE DAILY 07/01/22   Einar Pheasant, MD  famotidine (PEPCID) 40 MG tablet TAKE 1 TABLET EVERY DAY 04/15/22   Einar Pheasant, MD  HM MELATONIN PO 5 mg by Other route at bedtime as needed. patch    [provider]  isosorbide mononitrate (IMDUR) 30 MG 24 hr tablet TAKE 1 TABLET EVERY DAY 06/19/22   Einar Pheasant, MD  Multiple Vitamin (MULTIVITAMIN) tablet Take 1 tablet by mouth daily.    [provider]  mupirocin ointment (BACTROBAN) 2 % Apply 1 application. topically 2 (two) times daily. 03/06/22   Einar Pheasant, MD  nystatin (MYCOSTATIN/NYSTOP) powder APPLY TO THE AFFECTED AREA(S) TWICE DAILY 06/19/22   Einar Pheasant, MD  pantoprazole (PROTONIX) 40 MG tablet TAKE 1 TABLET TWICE DAILY BEFORE MEALS 04/15/22    Einar Pheasant, MD  propranolol ER (INDERAL LA) 80 MG 24 hr capsule Take 1 capsule (80 mg total) by mouth 2 (two) times daily. 05/13/22   Minna Merritts, MD  QUEtiapine (SEROQUEL) 50 MG tablet Take 1 tablet (50 mg total) by mouth at bedtime. Patient taking differently: Take 25 mg by mouth at bedtime. 05/14/22   Ursula Alert, MD  rOPINIRole (REQUIP) 3 MG tablet Take 1 tablet (3 mg total) by mouth at bedtime. 07/02/22   Ursula Alert, MD  rosuvastatin (CRESTOR) 20 MG tablet TAKE 1 TABLET EVERY DAY 04/15/22   Einar Pheasant, MD  Semaglutide,0.25 or 0.5MG/DOS, (OZEMPIC, 0.25 OR 0.5 MG/DOSE,) 2 MG/1.5ML SOPN Inject 0.25 mg weekly for 4 weeks then increase to 0.5 mg weekly 11/23/21   Einar Pheasant, MD  telmisartan (MICARDIS) 80 MG tablet TAKE 1 TABLET EVERY DAY 06/19/22   Einar Pheasant, MD  trimethoprim (TRIMPEX) 100 MG tablet Take 1 tablet (100 mg total) by mouth daily. 04/16/22   Bjorn Loser, MD  TRUE METRIX BLOOD GLUCOSE TEST test strip TEST BLOOD SUGAR TWICE DAILY AS DIRECTED 04/15/22   Einar Pheasant, MD  Vibegron (GEMTESA) 75 MG TABS Take 75 mg by mouth daily. 05/22/22   Bjorn Loser, MD    Physical Exam    Vital Signs:  KYMIA SIMI does not have vital signs available for review today.  Given telephonic nature of communication, physical exam is limited. AAOx3. NAD. Normal affect.  Speech and respirations are unlabored.  Accessory Clinical Findings    None  Assessment & Plan    1.  Preoperative Cardiovascular Risk Assessment: RCRI 0.9% indicating low CV risk. Based on comorbidities I would consider her to be at least moderate risk with concomitant pulmonary disease. She is not describing any unstable cardiac symptoms. The patient affirms she has been doing well without any new cardiac symptoms. They are able to achieve 5.38 METS without cardiac limitations. She reports higher levels of activity are predominantly limited by orthopedic problems. Therefore, based on  ACC/AHA guidelines, the patient would be at acceptable risk for the planned procedure without further cardiovascular testing.   Regarding anticoagulation, per pharmD review, Per office protocol, patient can hold Eliquis for 2 days prior to procedure.  They also recommend patient hold Ozempic for 7 days before. Relayed this recommendation to patient.  The patient was advised that if she develops new symptoms prior  to surgery to contact our office to arrange for a follow-up visit, and she verbalized understanding.  A copy of this note will be routed to requesting surgeon.  Time:   Today, I have spent 6 minutes with the patient with telehealth technology discussing medical history, symptoms, and management plan.     Charlie Pitter, PA-C  07/04/2022, 4:43 PM

## 2022-07-05 DIAGNOSIS — I10 Essential (primary) hypertension: Secondary | ICD-10-CM | POA: Diagnosis not present

## 2022-07-05 DIAGNOSIS — E119 Type 2 diabetes mellitus without complications: Secondary | ICD-10-CM | POA: Diagnosis not present

## 2022-07-05 DIAGNOSIS — J449 Chronic obstructive pulmonary disease, unspecified: Secondary | ICD-10-CM | POA: Diagnosis not present

## 2022-07-05 DIAGNOSIS — Z20822 Contact with and (suspected) exposure to covid-19: Secondary | ICD-10-CM | POA: Diagnosis not present

## 2022-07-08 ENCOUNTER — Ambulatory Visit: Payer: Medicare HMO | Admitting: Physician Assistant

## 2022-07-08 ENCOUNTER — Encounter: Payer: Self-pay | Admitting: Internal Medicine

## 2022-07-08 ENCOUNTER — Telehealth: Payer: Self-pay | Admitting: Internal Medicine

## 2022-07-08 DIAGNOSIS — N3281 Overactive bladder: Secondary | ICD-10-CM

## 2022-07-08 DIAGNOSIS — Z20822 Contact with and (suspected) exposure to covid-19: Secondary | ICD-10-CM | POA: Diagnosis not present
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
## 2022-07-08 NOTE — Progress Notes (Signed)
PTNS   Session # 12   Health & Social Factors: No Change Caffeine: 2 Alcohol: 0 Daytime voids #per day: 9 Night-time voids #per night: 2 Urgency: Severe  Incontinence Episodes #per day: 1 Ankle used: Right Treatment Setting: 5 Feeling/ Response: Sensory & Toe Flex Comments: Patient with increase in night time voids, incontinence episodes, and Urgency.   Performed By: Gaspar Cola CMA    Follow Up: one month

## 2022-07-09 ENCOUNTER — Encounter
Admission: RE | Admit: 2022-07-09 | Discharge: 2022-07-09 | Disposition: A | Discharge status: Home / Self Care | Payer: Medicare HMO | Source: Ambulatory Visit | Attending: Surgery | Admitting: Surgery

## 2022-07-09 ENCOUNTER — Encounter: Payer: Self-pay | Admitting: Internal Medicine

## 2022-07-09 VITALS — Ht 64.0 in | Wt 209.0 lb

## 2022-07-09 DIAGNOSIS — Z20822 Contact with and (suspected) exposure to covid-19: Secondary | ICD-10-CM | POA: Diagnosis not present

## 2022-07-09 DIAGNOSIS — E1159 Type 2 diabetes mellitus with other circulatory complications: Secondary | ICD-10-CM

## 2022-07-09 MED ORDER — ORAL CARE MOUTH RINSE: 15.0000 mL | Freq: Once | OROMUCOSAL | Status: AC

## 2022-07-09 MED ORDER — CEFAZOLIN SODIUM-DEXTROSE 2-4 GM/100ML-% IV SOLN
2.0000 g | INTRAVENOUS | Status: AC
  Administered 2022-07-10: 2 g via INTRAVENOUS

## 2022-07-09 MED ORDER — CHLORHEXIDINE GLUCONATE 0.12 % MT SOLN: 15.0000 mL | Freq: Once | OROMUCOSAL | Status: AC

## 2022-07-09 MED ORDER — SODIUM CHLORIDE 0.9 % IV SOLN: INTRAVENOUS | Status: DC

## 2022-07-09 NOTE — Patient Instructions (Addendum)
Your procedure is scheduled on: Wednesday July 10, 2022. Report to Day Surgery inside [facility] 2nd floor, stop by admissions desk before getting on elevator. To find out your arrival time please call [phone] between 1PM - 3PM on Tuesday July 09, 2022.  Remember: Instructions that are not followed completely may result in serious medical risk,  up to and including death, or upon the discretion of your surgeon and anesthesiologist your  surgery may need to be rescheduled.     _X__ 1. Do not eat food after midnight the night before your procedure.                 No chewing gum or hard candies. You may drink clear liquids up to 2 hours                 before you are scheduled to arrive for your surgery- DO not drink clear                 liquids within 2 hours of the start of your surgery.                 Clear Liquids include:  water.   __X__2. Complete the "Ensure Clear Pre-surgery Clear Carbohydrate Drink" provided to you, 2 hours before arrival. **If you are diabetic you will be provided with an alternative drink, Gatorade Zero or G2.  __X__3.  On the morning of surgery brush your teeth with toothpaste and water, you                may rinse your mouth with mouthwash if you wish.  Do not swallow any toothpaste or mouthwash.     _X__ 4.  No Alcohol for 24 hours before or after surgery.   _X__ 5.  Do Not Smoke or use e-cigarettes For 24 Hours Prior to Your Surgery.                 Do not use any chewable tobacco products for at least 6 hours prior to                 Surgery.  _X__  6.  Do not use any recreational drugs (marijuana, cocaine, heroin, ecstasy, MDMA or other)                For at least one week prior to your surgery.  Combination of these drugs with anesthesia                May have life threatening results.  ____  7.  Bring all medications with you on the day of surgery if instructed.   __X_ 8.  Notify your doctor if there is any  change in your medical condition      (cold, fever, infections).     Do not wear jewelry, make-up, hairpins, clips or nail polish. Do not wear lotions, powders, or perfumes. You may wear deodorant. Do not shave 48 hours prior to surgery. Men may shave face and neck. Do not bring valuables to the hospital.    [facility] is not responsible for any belongings or valuables.  Contacts, dentures or bridgework may not be worn into surgery. Leave your suitcase in the car. After surgery it may be brought to your room. For patients admitted to the hospital, discharge time is determined by your treatment team.   Patients discharged the day of surgery will not be allowed to drive home.   Make arrangements for  someone to be with you for the first 24 hours of your Same Day Discharge.   __X__ Take these medicines the morning of surgery with A SIP OF WATER:    1. amLODipine (NORVASC) 10 MG tablet  2. DULoxetine (CYMBALTA) 60 MG capsule  3. famotidine (PEPCID) 40 MG tablet  4. propranolol ER (INDERAL LA) 80 MG 24 hr capsule  5.   6.   ____ Fleet Enema (as directed)   __X__ Use CHG Soap (or wipes) as directed  ____ Use Benzoyl Peroxide Gel as instructed  __X__ Use inhalers on the day of surgery  Budeson-Glycopyrrol-Formoterol (BREZTRI AEROSPHERE) 160-9-4.8 MCG/ACT AERO  ____ Stop metformin 2 days prior to surgery    __X__ Stop Semaglutide,0.25 or 0.'5MG'$ /DOS, (OZEMPIC, 0.25 OR 0.5 MG/DOSE,) 2 MG/1.5ML SOPN 7 days prior to surgery as instructed by your doctor.  __X__ Stop ELIQUIS 5 MG TABS 2 days prior to your surgery as instructed by your doctor.   __X__ One Week prior to surgery- Stop Anti-inflammatories such as Ibuprofen, Aleve, Advil, Motrin, meloxicam (MOBIC), diclofenac, etodolac, ketorolac, Toradol, Daypro, piroxicam, Goody's or BC powders. OK TO USE TYLENOL IF NEEDED   __X__ Stop supplements until after surgery.    ____ Bring C-Pap to the hospital.    If you have any questions  regarding your pre-procedure instructions,  Please call Pre-admit Testing at [phone]

## 2022-07-10 ENCOUNTER — Ambulatory Visit: Payer: Medicare HMO | Admitting: Urgent Care

## 2022-07-10 ENCOUNTER — Encounter: Payer: Self-pay | Admitting: Surgery

## 2022-07-10 ENCOUNTER — Other Ambulatory Visit: Payer: Self-pay

## 2022-07-10 ENCOUNTER — Encounter
Admission: RE | Disposition: A | Discharge status: Home / Self Care | Payer: Self-pay | Source: Ambulatory Visit | Attending: Surgery

## 2022-07-10 ENCOUNTER — Ambulatory Visit
Admission: RE | Admit: 2022-07-10 | Discharge: 2022-07-10 | Disposition: A | Discharge status: Home / Self Care | Payer: Medicare HMO | Source: Ambulatory Visit | Attending: Surgery | Admitting: Surgery

## 2022-07-10 ENCOUNTER — Ambulatory Visit: Payer: Medicare HMO

## 2022-07-10 DIAGNOSIS — M1811 Unilateral primary osteoarthritis of first carpometacarpal joint, right hand: Secondary | ICD-10-CM | POA: Diagnosis not present

## 2022-07-10 DIAGNOSIS — J449 Chronic obstructive pulmonary disease, unspecified: Secondary | ICD-10-CM | POA: Diagnosis not present

## 2022-07-10 DIAGNOSIS — I4891 Unspecified atrial fibrillation: Secondary | ICD-10-CM | POA: Diagnosis not present

## 2022-07-10 DIAGNOSIS — M19041 Primary osteoarthritis, right hand: Secondary | ICD-10-CM | POA: Diagnosis not present

## 2022-07-10 DIAGNOSIS — Z7901 Long term (current) use of anticoagulants: Secondary | ICD-10-CM | POA: Diagnosis not present

## 2022-07-10 DIAGNOSIS — I2510 Atherosclerotic heart disease of native coronary artery without angina pectoris: Secondary | ICD-10-CM | POA: Diagnosis not present

## 2022-07-10 DIAGNOSIS — F3342 Major depressive disorder, recurrent, in full remission: Secondary | ICD-10-CM

## 2022-07-10 DIAGNOSIS — E1159 Type 2 diabetes mellitus with other circulatory complications: Secondary | ICD-10-CM

## 2022-07-10 DIAGNOSIS — G4701 Insomnia due to medical condition: Secondary | ICD-10-CM

## 2022-07-10 DIAGNOSIS — F418 Other specified anxiety disorders: Secondary | ICD-10-CM

## 2022-07-10 DIAGNOSIS — T7840XA Allergy, unspecified, initial encounter: Secondary | ICD-10-CM | POA: Diagnosis not present

## 2022-07-10 DIAGNOSIS — E119 Type 2 diabetes mellitus without complications: Secondary | ICD-10-CM | POA: Diagnosis not present

## 2022-07-10 HISTORY — PX: CARPOMETACARPAL (CMC) FUSION OF THUMB: SHX6290

## 2022-07-10 LAB — GLUCOSE, CAPILLARY
Glucose-Capillary: 142 mg/dL — ABNORMAL HIGH (ref 70–99)
Glucose-Capillary: 157 mg/dL — ABNORMAL HIGH (ref 70–99)

## 2022-07-10 SURGERY — CARPOMETACARPAL (CMC) FUSION OF THUMB
Anesthesia: General | Site: Thumb | Laterality: Right

## 2022-07-10 MED ORDER — EPHEDRINE SULFATE (PRESSORS) 50 MG/ML IJ SOLN
INTRAMUSCULAR | Status: DC | PRN
  Administered 2022-07-10 (×5): 10 mg via INTRAVENOUS

## 2022-07-10 MED ORDER — CEFAZOLIN SODIUM-DEXTROSE 2-4 GM/100ML-% IV SOLN
INTRAVENOUS | Status: AC
  Filled 2022-07-10: qty 100

## 2022-07-10 MED ORDER — SODIUM CHLORIDE 0.9 % IV SOLN: INTRAVENOUS | Status: DC

## 2022-07-10 MED ORDER — DEXMEDETOMIDINE (PRECEDEX) IN NS 20 MCG/5ML (4 MCG/ML) IV SYRINGE
PREFILLED_SYRINGE | INTRAVENOUS | Status: DC | PRN
  Administered 2022-07-10 (×2): 8 ug via INTRAVENOUS
  Administered 2022-07-10: 4 ug via INTRAVENOUS

## 2022-07-10 MED ORDER — OXYCODONE HCL 5 MG/5ML PO SOLN: 5.0000 mg | Freq: Once | ORAL | Status: DC | PRN

## 2022-07-10 MED ORDER — PHENYLEPHRINE HCL (PRESSORS) 10 MG/ML IV SOLN
INTRAVENOUS | Status: AC
  Filled 2022-07-10: qty 1

## 2022-07-10 MED ORDER — PHENYLEPHRINE 80 MCG/ML (10ML) SYRINGE FOR IV PUSH (FOR BLOOD PRESSURE SUPPORT)
PREFILLED_SYRINGE | INTRAVENOUS | Status: DC | PRN
  Administered 2022-07-10: 80 ug via INTRAVENOUS
  Administered 2022-07-10: 160 ug via INTRAVENOUS
  Administered 2022-07-10: 80 ug via INTRAVENOUS
  Administered 2022-07-10 (×2): 160 ug via INTRAVENOUS
  Administered 2022-07-10 (×2): 80 ug via INTRAVENOUS

## 2022-07-10 MED ORDER — MIDAZOLAM HCL 2 MG/2ML IJ SOLN
INTRAMUSCULAR | Status: AC
  Filled 2022-07-10: qty 2

## 2022-07-10 MED ORDER — METOCLOPRAMIDE HCL 10 MG PO TABS: 5.0000 mg | ORAL_TABLET | Freq: Three times a day (TID) | ORAL | Status: DC | PRN

## 2022-07-10 MED ORDER — HYDROCODONE-ACETAMINOPHEN 5-325 MG PO TABS: 1.0000 | ORAL_TABLET | ORAL | Status: DC | PRN

## 2022-07-10 MED ORDER — ONDANSETRON HCL 4 MG/2ML IJ SOLN
INTRAMUSCULAR | Status: DC | PRN
  Administered 2022-07-10: 4 mg via INTRAVENOUS

## 2022-07-10 MED ORDER — QUETIAPINE FUMARATE 50 MG PO TABS: 25.0000 mg | ORAL_TABLET | Freq: Every day | ORAL | Status: DC

## 2022-07-10 MED ORDER — FENTANYL CITRATE (PF) 100 MCG/2ML IJ SOLN
INTRAMUSCULAR | Status: DC | PRN
  Administered 2022-07-10 (×3): 50 ug via INTRAVENOUS

## 2022-07-10 MED ORDER — FENTANYL CITRATE (PF) 100 MCG/2ML IJ SOLN
INTRAMUSCULAR | Status: AC
  Filled 2022-07-10: qty 2

## 2022-07-10 MED ORDER — OZEMPIC (0.25 OR 0.5 MG/DOSE) 2 MG/1.5ML ~~LOC~~ SOPN: PEN_INJECTOR | SUBCUTANEOUS | 2 refills | Status: DC

## 2022-07-10 MED ORDER — BUPIVACAINE HCL (PF) 0.5 % IJ SOLN
INTRAMUSCULAR | Status: DC | PRN
  Administered 2022-07-10: 10 mL

## 2022-07-10 MED ORDER — BUPIVACAINE HCL (PF) 0.5 % IJ SOLN
INTRAMUSCULAR | Status: AC
  Filled 2022-07-10: qty 30

## 2022-07-10 MED ORDER — DEXAMETHASONE SODIUM PHOSPHATE 10 MG/ML IJ SOLN
INTRAMUSCULAR | Status: DC | PRN
  Administered 2022-07-10: 5 mg via INTRAVENOUS

## 2022-07-10 MED ORDER — ONDANSETRON HCL 4 MG/2ML IJ SOLN: 4.0000 mg | Freq: Four times a day (QID) | INTRAMUSCULAR | Status: DC | PRN

## 2022-07-10 MED ORDER — ONDANSETRON HCL 4 MG PO TABS: 4.0000 mg | ORAL_TABLET | Freq: Four times a day (QID) | ORAL | Status: DC | PRN

## 2022-07-10 MED ORDER — FENTANYL CITRATE (PF) 100 MCG/2ML IJ SOLN: 25.0000 ug | INTRAMUSCULAR | Status: DC | PRN

## 2022-07-10 MED ORDER — ACETAMINOPHEN 10 MG/ML IV SOLN
INTRAVENOUS | Status: DC | PRN
  Administered 2022-07-10: 1000 mg via INTRAVENOUS

## 2022-07-10 MED ORDER — 0.9 % SODIUM CHLORIDE (POUR BTL) OPTIME
TOPICAL | Status: DC | PRN
  Administered 2022-07-10: 500 mL

## 2022-07-10 MED ORDER — HYDROCODONE-ACETAMINOPHEN 5-325 MG PO TABS: 1.0000 | ORAL_TABLET | Freq: Four times a day (QID) | ORAL | 0 refills | Status: DC | PRN

## 2022-07-10 MED ORDER — OXYCODONE HCL 5 MG PO TABS: 5.0000 mg | ORAL_TABLET | Freq: Once | ORAL | Status: DC | PRN

## 2022-07-10 MED ORDER — PROPOFOL 10 MG/ML IV BOLUS
INTRAVENOUS | Status: DC | PRN
  Administered 2022-07-10: 30 mg via INTRAVENOUS
  Administered 2022-07-10: 120 mg via INTRAVENOUS

## 2022-07-10 MED ORDER — METOCLOPRAMIDE HCL 5 MG/ML IJ SOLN: 5.0000 mg | Freq: Three times a day (TID) | INTRAMUSCULAR | Status: DC | PRN

## 2022-07-10 MED ORDER — CHLORHEXIDINE GLUCONATE 0.12 % MT SOLN
OROMUCOSAL | Status: AC
  Administered 2022-07-10: 15 mL via OROMUCOSAL
  Filled 2022-07-10: qty 15

## 2022-07-10 MED ORDER — MIDAZOLAM HCL 2 MG/2ML IJ SOLN
INTRAMUSCULAR | Status: DC | PRN
  Administered 2022-07-10: 2 mg via INTRAVENOUS

## 2022-07-10 MED ORDER — LIDOCAINE HCL (CARDIAC) PF 100 MG/5ML IV SOSY
PREFILLED_SYRINGE | INTRAVENOUS | Status: DC | PRN
  Administered 2022-07-10: 50 mg via INTRAVENOUS

## 2022-07-10 SURGICAL SUPPLY — 46 items
ANCHOR FIBERLOCK SUSPENSION (Anchor) ×1 IMPLANT
APL PRP STRL LF DISP 70% ISPRP (MISCELLANEOUS) ×1
BLADE DEBAKEY 8.0 (BLADE) ×1 IMPLANT
BLADE OSC/SAGITTAL 5.5X25 (BLADE) ×2 IMPLANT
BLADE SURG 15 STRL LF DISP TIS (BLADE) ×2 IMPLANT
BLADE SURG 15 STRL SS (BLADE) ×4
BNDG CMPR 5X4 CHSV STRCH STRL (GAUZE/BANDAGES/DRESSINGS) ×1
BNDG COHESIVE 4X5 TAN STRL LF (GAUZE/BANDAGES/DRESSINGS) ×2 IMPLANT
BNDG ELASTIC 3X5.8 VLCR STR LF (GAUZE/BANDAGES/DRESSINGS) ×2 IMPLANT
BNDG ESMARK 4X12 TAN STRL LF (GAUZE/BANDAGES/DRESSINGS) ×2 IMPLANT
BNDG GZE 12X3 1 PLY HI ABS (GAUZE/BANDAGES/DRESSINGS) ×1
BNDG STRETCH GAUZE 3IN X12FT (GAUZE/BANDAGES/DRESSINGS) ×2 IMPLANT
BUR 4X55 1 (BURR) ×2 IMPLANT
CAST PADDING 3X4FT ST 30246 (SOFTGOODS) ×1
CHLORAPREP W/TINT 26 (MISCELLANEOUS) ×2 IMPLANT
CUFF TOURN SGL QUICK 18X4 (TOURNIQUET CUFF) ×1 IMPLANT
DRAPE FLUOR MINI C-ARM 54X84 (DRAPES) ×2 IMPLANT
FORCEPS JEWEL BIP 4-3/4 STR (INSTRUMENTS) ×2 IMPLANT
GAUZE XEROFORM 1X8 LF (GAUZE/BANDAGES/DRESSINGS) ×2 IMPLANT
GLOVE BIO SURGEON STRL SZ8 (GLOVE) ×4 IMPLANT
GLOVE SURG UNDER LTX SZ8 (GLOVE) ×2 IMPLANT
GOWN STRL REUS W/ TWL XL LVL3 (GOWN DISPOSABLE) ×1 IMPLANT
GOWN STRL REUS W/TWL XL LVL3 (GOWN DISPOSABLE) ×2
KIT TURNOVER KIT A (KITS) ×2 IMPLANT
MANIFOLD NEPTUNE II (INSTRUMENTS) ×2 IMPLANT
NS IRRIG 500ML POUR BTL (IV SOLUTION) ×2 IMPLANT
PACK EXTREMITY ARMC (MISCELLANEOUS) ×2 IMPLANT
PAD CAST CTTN 3X4 STRL (SOFTGOODS) ×2 IMPLANT
PADDING CAST COTTON 3X4 STRL (SOFTGOODS) ×1
PASSER SUT SWANSON 36MM LOOP (INSTRUMENTS) IMPLANT
SPLINT CAST 1 STEP 3X12 (MISCELLANEOUS) ×2 IMPLANT
STOCKINETTE 48X4 2 PLY STRL (GAUZE/BANDAGES/DRESSINGS) ×1 IMPLANT
STOCKINETTE IMPERVIOUS 9X36 MD (GAUZE/BANDAGES/DRESSINGS) ×2 IMPLANT
STOCKINETTE STRL 4IN 9604848 (GAUZE/BANDAGES/DRESSINGS) ×2 IMPLANT
STRIP CLOSURE SKIN 1/2X4 (GAUZE/BANDAGES/DRESSINGS) ×1 IMPLANT
STRIP CLOSURE SKIN 1/4X4 (GAUZE/BANDAGES/DRESSINGS) ×1 IMPLANT
SUT ETHIBOND 0 MO6 C/R (SUTURE) ×1 IMPLANT
SUT PROLENE 4 0 PS 2 18 (SUTURE) ×2 IMPLANT
SUT VIC AB 2-0 CT2 27 (SUTURE) ×2 IMPLANT
SUT VIC AB 2-0 SH 27 (SUTURE) ×2
SUT VIC AB 2-0 SH 27XBRD (SUTURE) ×1 IMPLANT
SUT VIC AB 3-0 SH 27 (SUTURE)
SUT VIC AB 3-0 SH 27X BRD (SUTURE) ×1 IMPLANT
TRAP FLUID SMOKE EVACUATOR (MISCELLANEOUS) ×1 IMPLANT
WATER STERILE IRR 500ML POUR (IV SOLUTION) ×2 IMPLANT
WIRE Z .062 C-WIRE SPADE TIP (WIRE) IMPLANT

## 2022-07-10 NOTE — H&P (Signed)
History of Present Illness: Kathryn Friedman is a 66 y.o. female who presents today for repeat evaluation of ongoing right thumb pain. 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 The patient was placed in a thumb spica Velcro wrist splint, she was given a prednisone Dosepak which she states did not provide significant relief of her discomfort. At today's visit the patient states that she continues to have pain when attempting to grip objects and lift objects with the right hand, this pain is felt around the base of the right thumb and she describes the pain as an aching and throbbing discomfort. She denies any surgical history to the right thumb at today's visit. She denies any numbness or ting of the right upper extremity today's appointment. The patient is quite frustrated by her continued right thumb discomfort. The patient does have a history of A-fib, she does take chronic Eliquis. She does have a history of stent placement as well. She also has a history of COPD. No history of blood clots.  Past Medical History: Anemia 2014  Anxiety 2015  Arthritis  Asthma, unspecified asthma severity, unspecified whether complicated, unspecified whether persistent  Bilateral carotid artery stenosis  Chicken pox  Chronic diarrhea, unspecified  COPD (chronic obstructive pulmonary disease) (CMS-HCC)  Coronary artery disease  COVID-19 12/09/2020  COVID-19 11/2020  Depression 2015  Diabetes mellitus without complication (CMS-HCC)  Diverticulosis 08/11/2014  Dysphagia  Fatty infiltration of liver 08/11/2014  GERD (gastroesophageal reflux disease)  Hyperlipidemia  Hypertension  Interstitial lung disease (CMS-HCC)  Obesity  Overactive bladder  Paroxysmal A-fib (CMS-HCC) 06/07/2021  PSVT (paroxysmal supraventricular tachycardia)  (CMS-HCC)  Sleep apnea with use of continuous positive airway pressure (CPAP)  Spastic colon  Tobacco abuse  Venous insufficiency   Past Surgical History: [place]  hysterectomy 1996 with left ovary in place  COLONOSCOPY 07/13/2002 (Normal Colon)  CARDIAC CATHETERIZATION 2008  COLONOSCOPY 09/13/2016 (Adenomatous Polyps: CBF 08/2019)  INCISIONAL BIOPSY BREAST Left 10/14/2017  EGD 02/02/2018 (Gastritis, Esophagitis: No repeat per RTE)  cardiac cath and coronary angiography Left 05/14/2018  COLONOSCOPY 11/09/2018 (Mild focal colitis; [place] Adenomatous Polyps: CBF 10/2023)  COLONOSCOPY 03/29/2020 (Normal Colon/PHx CP/Repeat 23yr/JWB)  EGD 03/29/2020 (Normal EGD/No Repeat/JWB)  Incision and drainage abdominal wall abscess Right 07/20/2020  Left total hip arthroplasty Left 06/12/2021 (Dr.Loyce Flaming)  cardiac cath with stents 2007  CHOLECYSTECTOMY  COLONOSCOPY 12/02/2013, 02/13/2009  Diverticulosis: CBF 11/2023  EGD 12/02/2013, 07/13/2002  REPLACEMENT TOTAL KNEE BILATERAL 2002,2008  WISDOM TEETH   Past Family History: Breast cancer Other  Colon cancer Other  paternal  Cancer Other  Lung cancer Mother  Heart disease Mother  Mitral valve prolapse Mother  Depression Mother  COPD Mother  Heart disease Father  Coronary Artery Disease (Blocked arteries around heart) Father  Myocardial Infarction (Heart attack) Father  Lung cancer Maternal Aunt  Lung cancer Maternal Grandmother  Leukemia Paternal Grandmother  Mitral valve prolapse Sister  Bipolar disorder Sister  Cirrhosis Brother  Hepatitis C Brother   Medications: acetaminophen (TYLENOL) 500 MG tablet Take 1,000 mg by mouth as needed for Pain  albuterol 90 mcg/actuation inhaler Inhale 2 puffs every 4 hours by inhalation route.  amLODIPine (NORVASC) 10 MG tablet Take 1 tablet by mouth once daily  apixaban (ELIQUIS) 5 mg tablet  Take 5 mg  by mouth 2 (two) times daily  budesonide-glycopyrrolate-formoterol (BREZTRI AEROSPHERE) 160-9-4.8 mcg/actuation inhaler Inhale into the lungs  Ca comb no.1/vit D3/B6/FA/B12 (VITAMIN D3, CALCIUM CIT-PHOS, ORAL) Take 1 tablet by mouth once daily  calcium carbonate-vitamin D3 (OS-CAL 500+D) 500 mg(1,'250mg'$ ) -200 unit tablet Take 1 tablet by mouth once daily  cyanocobalamin (VITAMIN B12) 1000 MCG tablet Take 1,000 mcg by mouth once daily  DULoxetine (CYMBALTA) 60 MG DR capsule Take 1 capsule by mouth once daily  eszopiclone (LUNESTA) 1 MG tablet Take 2 mg by mouth at bedtime as needed  eszopiclone (LUNESTA) 2 MG tablet Take by mouth Take 1 tablet (2 mg total) by mouth at bedtime as needed for sleep. Take immediately before bedtime  famotidine (PEPCID) 40 MG tablet Take 40 mg by mouth once daily  isosorbide mononitrate (IMDUR) 30 MG ER tablet Take 1 tablet (30 mg total) by mouth once daily 90 tablet 1  lamoTRIgine (LAMICTAL) 25 MG tablet Take 25 mg by mouth once daily  multivitamin tablet Take 1 tablet by mouth once daily  mupirocin (BACTROBAN) 2 % ointment Apply 1 Application topically 2 (two) times daily  mv,cal,iron,mn/folic acid/chol (HAIR-SKIN-NAILS, PABA, ORAL) Take by mouth 2 gummies daily  nystatin (MYCOSTATIN) 100,000 unit/gram powder as needed  pantoprazole (PROTONIX) 40 MG DR tablet Take 1 tablet (40 mg total) by mouth 2 (two) times daily before meals 60 tablet 3  propranoloL (INNOPRAN XL) 80 MG XL capsule Take by mouth Take 1 capsule (80 mg total) by mouth 2 (two) times daily.  QUEtiapine (SEROQUEL) 50 MG tablet Take 50 mg by mouth at bedtime  rOPINIRole (REQUIP) 2 MG tablet Take 2 mg by mouth at bedtime  rosuvastatin (CRESTOR) 20 MG tablet Take 20 mg by mouth once daily  semaglutide (OZEMPIC) 0.25 mg or 0.5 mg (2 mg/3 mL) pen injector Inject 0.25 mg subcutaneously once a week  trimethoprim 100 mg tablet Take 100 mg by mouth once daily  TRUE METRIX GLUCOSE TEST STRIP test strip 1 strip 3  (three) times daily  TRUEPLUS LANCETS 1 each 3 (three) times daily  vibegron (GEMTESA) 75 mg Tab Take 75 mg by mouth once daily  apixaban (ELIQUIS ORAL) Take by mouth 2 (two) times daily (Patient not taking: Reported on 05/29/2022)  gabapentin (NEURONTIN) 300 MG capsule Take 600 mg by mouth at bedtime (Patient not taking: Reported on 05/29/2022)  metFORMIN (GLUCOPHAGE) 500 MG tablet Take 1,000 mg by mouth 2 (two) times daily with meals (Patient not taking: Reported on 05/29/2022)  oxymetazoline (AFRIN) 0.05 % nasal spray Place 1 spray into one nostril as needed (Patient not taking: Reported on 05/29/2022)  predniSONE (DELTASONE) 5 MG tablet Take 6 tabs on days 1. Take 5 tabs on days 2. Take 4 tabs days 3. Take 3 tabs days 4 Take 2 tabs days 5. Take 1 tab days 6. (Patient not taking: Reported on 06/28/2022) 21 tablet 0  propranoloL (INDERAL LA) 60 MG LA capsule (Patient not taking: Reported on 05/29/2022)  telmisartan (MICARDIS) 80 MG tablet Take 1 tablet (80 mg total) by mouth once daily 90 tablet 4   Allergies: Varenicline Other ("I got really depressed" CHANTEX) Empagliflozin Other (Yeast infection)  Methylprednisolone (Nausea And Vomiting)   Review of Systems:  A comprehensive 14 point ROS was performed, reviewed by me today, and the pertinent orthopaedic findings are documented in the HPI.  Physical Exam: BP 118/78  Ht 162.6 cm ('5\' 4"'$ )  Wt 96.4 kg (212 lb 9.6 oz)  BMI 36.49  kg/m  General/Constitutional: The patient appears to be well-nourished, well-developed, and in no acute distress. Neuro/Psych: Normal mood and affect, oriented to person, place and time. Eyes: Non-icteric. Pupils are equal, round, and reactive to light, and exhibit synchronous movement. ENT: Unremarkable. Lymphatic: No palpable adenopathy. Respiratory: Lungs clear to auscultation, Normal chest excursion, No wheezes, and Non-labored breathing Cardiovascular: Regular rate and rhythm. No murmurs. and No edema, swelling or  tenderness, except as noted in detailed exam. Integumentary: No impressive skin lesions present, except as noted in detailed exam. Musculoskeletal: Unremarkable, except as noted in detailed exam.  General: Well developed, well nourished 66 y.o. female in no apparent distress. Normal affect. Normal communication. Patient answers questions appropriately. The patient has a normal gait. There is no antalgic component. There is no hip lurch.   Right Upper Extremity: Examination of the right hand revealed no bony abnormality, no ecchymosis, and no edema. The patient had full range of motion of the wrist and hand, including the digits. The patient is tender palpation throughout the right CMC joint, she does have increased pain with abduction, adduct in addition to flexion extension of the right thumb ion. The right thumb is stable to varus and valgus stress testing at today's visit. There is no nodularity or cysts noted. There was no triggering of the digits. The patient had a negative Finkelstein test. The patient does have a positive grind test. The patient had no snuffbox tenderness. The patient had full composite fist. There was no angulation or rotation of the digits.   Neurologic: The patient had sensation that was intact to light touch. The patient had full motor strength. There was no tremor or clonus noted.   Vascular: The patient had good skin warmth. The radial and ulnar pulse were normal and intact. The patient had less than 2 second capillary refill.   Imaging: AP, lateral and oblique images of the right thumb were obtained today in the office and reviewed by me. 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 There is mild osteophyte formation along the more radial aspect of the proximal phalanx. No evidence of acute fracture. No lytic lesions identified.  Impression: 1. DJD Right thumb CMC  joint.  Plan:  1. Treatment options were discussed today with the patient. 2. The patient continues to experience moderate to severe pain in her right thumb CMC joint, discussed both conservative and aggressive treatment options with the patient today. 3. After discussion of both the risk and benefits, the patient would like to proceed with a right thumb CMC suspension arthroplasty to be performed by Dr. Roland Rack. The surgical risk and benefits were discussed in detail today in addition to the expected postop recovery. 4. This document will serve as a surgical history and physical for the patient. 5. They can call the clinic they have any questions, new symptoms develop or symptoms worsen.  The procedure was discussed with the patient, as were the potential risks (including bleeding, infection, nerve and/or blood vessel injury, persistent or recurrent pain, failure of the repair, joint instability, need for further surgery, blood clots, strokes, heart attacks and/or arhythmias, pneumonia, etc.) and benefits. The patient states her understanding and wishes to proceed.   H&P reviewed and patient re-examined. No changes.

## 2022-07-10 NOTE — Transfer of Care (Signed)
Immediate Anesthesia Transfer of Care Note  Patient: Kathryn Friedman  Procedure(s) Performed: CARPOMETACARPAL [facility]) SUSPENSION OF RIGHT THUMB (Right: Thumb)  Patient Location: PACU  Anesthesia Type:General  Level of Consciousness: awake  Airway & Oxygen Therapy: Patient Spontanous Breathing  Post-op Assessment: Report given to RN and Post -op Vital signs reviewed and stable  Post vital signs: Reviewed and stable  Last Vitals:  Vitals Value Taken Time  BP 103/60 07/10/22 1050  Temp    Pulse 70 07/10/22 1053  Resp 13 07/10/22 1053  SpO2 93 % 07/10/22 1053  Vitals shown include unvalidated device data.  Last Pain:  Vitals:   07/10/22 0820  TempSrc: Temporal  PainSc: 5          Complications: No notable events documented.

## 2022-07-10 NOTE — Anesthesia Preprocedure Evaluation (Signed)
Anesthesia Evaluation  Patient identified by MRN, date of birth, ID band Patient awake    Reviewed: Allergy & Precautions, NPO status , Patient's Chart, lab work & pertinent test results  Airway Mallampati: III  TM Distance: >3 FB Neck ROM: full    Dental  (+) Lower Dentures, Upper Dentures   Pulmonary shortness of breath and with exertion, sleep apnea and Continuous Positive Airway Pressure Ventilation , COPD, Current Smoker and Patient abstained from smoking.,    Pulmonary exam normal        Cardiovascular hypertension, (-) angina+ CAD and + Cardiac Stents  + dysrhythmias Atrial Fibrillation      Neuro/Psych PSYCHIATRIC DISORDERS negative neurological ROS     GI/Hepatic negative GI ROS, Neg liver ROS,   Endo/Other  negative endocrine ROSdiabetes  Renal/GU      Musculoskeletal   Abdominal   Peds  Hematology negative hematology ROS (+)   Anesthesia Other Findings Past Medical History: No date: Anemia No date: Anginal pain ([place]) No date: Anxiety No date: Arthritis     Comment:  back and knees No date: Asthma No date: Bilateral carotid artery stenosis No date: Blood in stool No date: Chronic diarrhea No date: COPD (chronic obstructive pulmonary disease) (HCC) No date: Coronary artery disease     Comment:  a.) 75% pRCA; 3.5 x 28 mm Cypher DES placed on               07/24/2006 No date: Current use of long term anticoagulation     Comment:  Clopidogrel No date: Depression     Comment:  secondary to the death of her husband (died 64) No date: Diverticulitis No date: Diverticulosis No date: Dizzinesses No date: Dysphagia No date: Dyspnea No date: Dysrhythmia No date: Fatty infiltration of liver No date: GERD (gastroesophageal reflux disease) No date: Headache 12/09/2020: History of 2019 novel coronavirus disease (COVID-19) 12/20/2020: History of 2019 novel coronavirus disease (COVID-19) No date:  Hypertension No date: Hypertriglyceridemia No date: ILD (interstitial lung disease) ([place]) No date: Lump in the abdomen No date: OSA on CPAP No date: Overactive bladder No date: PSVT (paroxysmal supraventricular tachycardia) (HCC) No date: Spastic colon 05/2008: T2DM (type 2 diabetes mellitus) ([place]) No date: Tobacco abuse No date: Venous insufficiency of both lower extremities  Past Surgical History: with left ovary in place 1996: ABDOMINAL HYSTERECTOMY 1985: APPENDECTOMY     Comment:  gallbladder and Appendix 10/14/2017: BREAST BIOPSY; Left     Comment:  calcs bx, fibrosis giant cell reaction and chronic               inflammation, negative for malignancy.  No date: CATARACT EXTRACTION, BILATERAL 1984: [place]: CHOLECYSTECTOMY 09/13/2016: COLONOSCOPY WITH PROPOFOL; N/A     Comment:  Procedure: COLONOSCOPY WITH PROPOFOL;  Surgeon: Manya Silvas, MD;  Location: [facility] ENDOSCOPY;  Service:               Endoscopy;  Laterality: N/A; 11/09/2018: COLONOSCOPY WITH PROPOFOL; N/A     Comment:  Procedure: COLONOSCOPY WITH PROPOFOL;  Surgeon: Manya Silvas, MD;  Location: [facility] ENDOSCOPY;  Service:               Endoscopy;  Laterality: N/A; 03/29/2020: COLONOSCOPY WITH PROPOFOL; N/A     Comment:  Procedure: COLONOSCOPY WITH PROPOFOL;  Surgeon: Bary Castilla,  Forest Gleason, MD;  Location: [facility] ENDOSCOPY;  Service:               Endoscopy;  Laterality: N/A; 07/24/2006: CORONARY ANGIOPLASTY WITH STENT PLACEMENT; N/A     Comment:  75% pRCA; 3.5 x 28 mm Cypher DES placed; Location: [place];              Surgeons: Katrine Coho, MD 02/02/2018: ESOPHAGOGASTRODUODENOSCOPY (EGD) WITH PROPOFOL; N/A     Comment:  Procedure: ESOPHAGOGASTRODUODENOSCOPY (EGD) WITH               PROPOFOL;  Surgeon: Manya Silvas, MD;  Location:               [facility] ENDOSCOPY;  Service: Endoscopy;  Laterality: N/A; 03/29/2020: ESOPHAGOGASTRODUODENOSCOPY (EGD) WITH  PROPOFOL; N/A     Comment:  Procedure: ESOPHAGOGASTRODUODENOSCOPY (EGD) WITH               PROPOFOL;  Surgeon: Robert Bellow, MD;  Location:               [facility] ENDOSCOPY;  Service: Endoscopy;  Laterality: N/A; No date: EYE SURGERY No date: JOINT REPLACEMENT     Comment:  bilateral knee replacements Arthroscopic left knee surgery : KNEE ARTHROSCOPY status post knee surgey : KNEE SURGERY 05/14/2018: LEFT HEART CATH AND CORONARY ANGIOGRAPHY; Left     Comment:  Procedure: LEFT HEART CATH AND CORONARY ANGIOGRAPHY;                Surgeon: Corey Skains, MD;  Location: [place]               CV LAB;  Service: Cardiovascular;  Laterality: Left; ([facility]): REPLACEMENT TOTAL KNEE shoulder operation secondary to a torn tendon: SHOULDER SURGERY 06/12/2021: TOTAL HIP ARTHROPLASTY; Left     Comment:  Procedure: TOTAL HIP ARTHROPLASTY;  Surgeon: Corky Mull, MD;  Location: [facility] ORS;  Service: Orthopedics;                Laterality: Left;  BMI    Body Mass Index: 35.87 kg/m      Reproductive/Obstetrics negative OB ROS                             Anesthesia Physical Anesthesia Plan  ASA: 3  Anesthesia Plan: General   Post-op Pain Management:    Induction: Intravenous  PONV Risk Score and Plan: Dexamethasone, Ondansetron, Midazolam and Treatment may vary due to age or medical condition  Airway Management Planned: LMA  Additional Equipment:   Intra-op Plan:   Post-operative Plan: Extubation in OR  Informed Consent: I have reviewed the patients History and Physical, chart, labs and discussed the procedure including the risks, benefits and alternatives for the proposed anesthesia with the patient or authorized representative who has indicated his/her understanding and acceptance.     Dental Advisory Given  Plan Discussed with: Anesthesiologist, CRNA and Surgeon  Anesthesia Plan Comments: (Patient consented for risks of anesthesia  including but not limited to:  - adverse reactions to medications - damage to eyes, teeth, lips or other oral mucosa - nerve damage due to positioning  - sore throat or hoarseness - Damage to heart, brain, nerves, lungs, other parts of body or loss of life  Patient voiced understanding.)        Anesthesia Quick Evaluation

## 2022-07-10 NOTE — Discharge Instructions (Addendum)
Orthopedic discharge instructions: Keep splint dry and intact. Keep hand elevated above heart level. Apply ice to affected area frequently. Take pain medication as prescribed or ES Tylenol when needed.  Resume Eliquis tomorrow morning. Return for follow-up in 10-14 days or as scheduled.       AMBULATORY SURGERY  DISCHARGE INSTRUCTIONS   The drugs that you were given will stay in your system until tomorrow so for the next 24 hours you should not:  Drive an automobile Make any legal decisions Drink any alcoholic beverage   You may resume regular meals tomorrow.  Today it is better to start with liquids and gradually work up to solid foods.  You may eat anything you prefer, but it is better to start with liquids, then soup and crackers, and gradually work up to solid foods.   Please notify your doctor immediately if you have any unusual bleeding, trouble breathing, redness and pain at the surgery site, drainage, fever, or pain not relieved by medication.     Your post-operative visit with Dr.                                       is: Date:                        Time:    Please call to schedule your post-operative visit.  Additional Instructions:

## 2022-07-10 NOTE — Anesthesia Procedure Notes (Signed)
Procedure Name: LMA Insertion Date/Time: 07/10/2022 9:34 AM  Performed by: Biagio Borg, CRNAPre-anesthesia Checklist: Patient identified, Emergency Drugs available, Suction available and Patient being monitored Patient Re-evaluated:Patient Re-evaluated prior to induction Oxygen Delivery Method: Circle system utilized Preoxygenation: Pre-oxygenation with 100% oxygen Induction Type: IV induction Ventilation: Mask ventilation without difficulty LMA: LMA inserted LMA Size: 4.0 Tube type: Oral Number of attempts: 1 Placement Confirmation: positive ETCO2 and breath sounds checked- equal and bilateral Tube secured with: Tape Dental Injury: Teeth and Oropharynx as per pre-operative assessment

## 2022-07-10 NOTE — Anesthesia Postprocedure Evaluation (Signed)
Anesthesia Post Note  Patient: QUITA MCGRORY  Procedure(s) Performed: CARPOMETACARPAL [facility]) SUSPENSION OF RIGHT THUMB (Right: Thumb)  Patient location during evaluation: PACU Anesthesia Type: General Level of consciousness: awake and alert Pain management: pain level controlled Vital Signs Assessment: post-procedure vital signs reviewed and stable Respiratory status: spontaneous breathing, nonlabored ventilation, respiratory function stable and patient connected to nasal cannula oxygen Cardiovascular status: blood pressure returned to baseline and stable Postop Assessment: no apparent nausea or vomiting Anesthetic complications: no   No notable events documented.   Last Vitals:  Vitals:   07/10/22 1124 07/10/22 1137  BP:  118/67  Pulse: 68 69  Resp: 16 18  Temp: (!) 36.2 C (!) 36.2 C  SpO2: 93% 94%    Last Pain:  Vitals:   07/10/22 1137  TempSrc: Temporal  PainSc: 0-No pain                 Dimas Millin

## 2022-07-10 NOTE — Op Note (Signed)
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  Post-Op Diagnosis:   Same.  Procedure:   Suspension arthroplasty right thumb CMC joint.   Surgeon:   Pascal Lux, MD  Assistant:   None  Anesthesia:   General LMA  Findings:   As above.  Complications:   None  EBL:   0 cc  Fluids:   500 cc crystalloid  TT:   53 minutes at 250 mmHg  Drains:   None  Closure:   3-0 Vicryl subcuticular sutures  Implants:   Arthrex FiberLock suspension device.  Brief Clinical Note:   The patient is a 66 year old female with a history of progressively worsening basilar right thumb pain. The patient's symptoms have progressed despite medications, activity modification, injections, splinting, etc. The patient's history and examination are consistent with advanced degenerative joint disease of the right thumb CMC joint which was confirmed by plain radiographs. The patient presents at this time for a suspension arthroplasty of the right thumb CMC joint.  Procedure:    The patient was brought into the operating room and lain in the supine position. After adequate general laryngeal mask anesthesia was obtained, the patient's right hand and upper extremity were prepped with ChloraPrep solution before being draped sterilely. Preoperative antibiotics were administered. A timeout was performed to verify the appropriate surgical site before the limb was exsanguinated with an Esmarch and the tourniquet inflated to 250 mmHg.   An approximately 3-4 cm longitudinal incision was made centered over the radial aspect of the thumb CMC joint. The incision was carried down through the subcutaneous cutaneous tissues with care taken to identify and protect the local neurovascular structures. Several small veins were cauterized with bipolar electrocautery. A longitudinal incision was made through the capsular tissues over the dorsal aspect of the  thumb CMC joint. Subperiosteal dissection was carried out to expose the trapezium dorsally and volarly. Once the [facility] and scaphotrapezial joints were identified, a sagittal cut was made in the trapezium using the micro-oscillating saw. This cut extended approximately two-thirds down through the bone. A Hoke osteotome was used to complete transection of the bone. The bone was then removed piecemeal using rongeurs and further dissection. The flexor carpi radialis tendon was identified at the base of the defect. The base of the thumb metacarpal was prepared by exposing its radial base.  Under fluoroscopic visualization, a guidewire was passed through the base of the index metacarpal from radial to ulnar. After verifying its position fluoroscopically, the threaded introducer was secured over the top of it and advanced appropriately. The guidewire was removed and the appropriate size drill was passed through the base of the index metacarpal bicortically. The Arthrex FiberLock suspension device was then inserted through the base of the index metacarpal. Under fluoroscopic visualization, the introducer was advanced through the ulnar cortex before the device was deployed. Traction was pulled on the sutures to set the suture anchor before removing the introducer.    A guidewire was placed from the dorsal radial surface into the base of the thumb metacarpal, then overdrilled with the smaller drill. The 3.5 mm anchor was inserted with the two ends of the fiber tape and advanced fully to secure the sutures into the bone. Mild tension was held on the thumb to optimize thumb position. The adequacy of the reduction of the base of the thumb to the base of the index metacarpal was verified in  AP, lateral, and oblique views and found to be excellent.  The wound was copiously irrigated with sterile saline solution using bulb irrigation. The thumb CMC capsular tissues were reapproximated using 3-0 Vicryl interrupted sutures before  the skin was closed using 3-0 Vicryl subcuticular interrupted sutures. Benzoin and Steri-Strips were applied to the skin. A total of 10 cc of 0.5% plain Sensorcaine was injected in and around both incisions to help with postoperative analgesia. A sterile bulky dressing was applied to the wounds before the patient was placed into a fiberglass thumb spica splint maintaining the thumb in the position of function. The patient was then awakened, extubated, and returned to the recovery room in satisfactory condition after tolerating the procedure well.

## 2022-07-11 ENCOUNTER — Encounter: Payer: Self-pay | Admitting: Surgery

## 2022-07-11 NOTE — Telephone Encounter (Signed)
Please call her and notify her that I was not in the office yesterday and will not be in the office the rest of this week and beginning of next week.  I reviewed her blood sugar readings and her blood pressure readings and these are ok. Regarding her question about "capillary glucose" , please let her know that this is just a blood sugar reading.  They check her sugar pre surgery, during surgery and post surgery.  These readings are ok.  Let us know if any problems.

## 2022-07-11 NOTE — Telephone Encounter (Signed)
I called patient & relayed message to patient from Dr. Nicki Reaper. She said that she was just concerned as glucose reading had stated high. I explained that in a non-diabetic world that it was but actually overall they were okay. Patient had surgery on her hand & has not been able to check BP with that side. She will wait until swelling goes down in her hand & start checking BP again on that side since she can get cuff on that particular side.

## 2022-07-12 DIAGNOSIS — Z20822 Contact with and (suspected) exposure to covid-19: Secondary | ICD-10-CM | POA: Diagnosis not present

## 2022-07-13 DIAGNOSIS — Z20822 Contact with and (suspected) exposure to covid-19: Secondary | ICD-10-CM | POA: Diagnosis not present

## 2022-07-14 ENCOUNTER — Other Ambulatory Visit: Payer: Self-pay | Admitting: Psychiatry

## 2022-07-14 DIAGNOSIS — F330 Major depressive disorder, recurrent, mild: Secondary | ICD-10-CM

## 2022-07-16 ENCOUNTER — Encounter: Payer: Self-pay | Admitting: Internal Medicine

## 2022-07-16 DIAGNOSIS — Z20822 Contact with and (suspected) exposure to covid-19: Secondary | ICD-10-CM | POA: Diagnosis not present

## 2022-07-17 DIAGNOSIS — Z20822 Contact with and (suspected) exposure to covid-19: Secondary | ICD-10-CM | POA: Diagnosis not present

## 2022-07-20 DIAGNOSIS — Z20822 Contact with and (suspected) exposure to covid-19: Secondary | ICD-10-CM | POA: Diagnosis not present

## 2022-07-21 DIAGNOSIS — Z20822 Contact with and (suspected) exposure to covid-19: Secondary | ICD-10-CM | POA: Diagnosis not present

## 2022-07-23 ENCOUNTER — Encounter: Payer: Self-pay | Admitting: Internal Medicine

## 2022-07-23 ENCOUNTER — Telehealth (INDEPENDENT_AMBULATORY_CARE_PROVIDER_SITE_OTHER): Payer: Medicare HMO | Admitting: Internal Medicine

## 2022-07-23 DIAGNOSIS — I779 Disorder of arteries and arterioles, unspecified: Secondary | ICD-10-CM | POA: Diagnosis not present

## 2022-07-23 DIAGNOSIS — E1159 Type 2 diabetes mellitus with other circulatory complications: Secondary | ICD-10-CM | POA: Diagnosis not present

## 2022-07-23 DIAGNOSIS — I480 Paroxysmal atrial fibrillation: Secondary | ICD-10-CM | POA: Diagnosis not present

## 2022-07-23 DIAGNOSIS — J449 Chronic obstructive pulmonary disease, unspecified: Secondary | ICD-10-CM | POA: Diagnosis not present

## 2022-07-23 DIAGNOSIS — I700 Atherosclerosis of aorta: Secondary | ICD-10-CM

## 2022-07-23 DIAGNOSIS — K219 Gastro-esophageal reflux disease without esophagitis: Secondary | ICD-10-CM

## 2022-07-23 DIAGNOSIS — J849 Interstitial pulmonary disease, unspecified: Secondary | ICD-10-CM

## 2022-07-23 DIAGNOSIS — E7800 Pure hypercholesterolemia, unspecified: Secondary | ICD-10-CM

## 2022-07-23 DIAGNOSIS — I2510 Atherosclerotic heart disease of native coronary artery without angina pectoris: Secondary | ICD-10-CM

## 2022-07-23 DIAGNOSIS — F331 Major depressive disorder, recurrent, moderate: Secondary | ICD-10-CM

## 2022-07-23 DIAGNOSIS — R197 Diarrhea, unspecified: Secondary | ICD-10-CM

## 2022-07-23 DIAGNOSIS — I10 Essential (primary) hypertension: Secondary | ICD-10-CM

## 2022-07-23 NOTE — Progress Notes (Signed)
Patient ID: Kathryn Friedman, female   DOB: [birth year], 66 y.o.   MRN: [id number]   Virtual Visit via video Note  All issues noted in this document were discussed and addressed.  No physical exam was performed (except for noted visual exam findings with Video Visits).   I connected with [name] by a video enabled telemedicine application and verified that I am speaking with the correct person using two identifiers. Location patient: home Location provider: work  Persons participating in the virtual visit: patient, provider  The limitations, risks, security and privacy concerns of performing an evaluation and management service by video and the availability of in person appointments have been discussed.  It has also been discussed with the patient that there may be a patient responsible charge related to this service. The patient expressed understanding and agreed to proceed.   Reason for visit: follow up appt  HPI: Follow up regarding blood pressure and blood sugars.  Is s/u hand surgery 07/10/22.  Has f/u tomorrow with ortho.  Hopes to get cast off.  Blood sugars and blood pressure up some recently.  Feels related to the above and increased stress.  Also not able to cook and prepare meals at home as well.  No chest pain.  Breathing stable.  No increased cough or congestion.  No abdominal pain. Since surgery - some loose stool.     ROS: See pertinent positives and negatives per HPI.  Past Medical History:  Diagnosis Date   Anemia    Anginal pain (HCC)    Anxiety    Arthritis    back and knees   Asthma    Bilateral carotid artery stenosis    Blood in stool    Chronic diarrhea    COPD (chronic obstructive pulmonary disease) (HCC)    Coronary artery disease    a.) 75% pRCA; 3.5 x 28 mm Cypher DES placed on 07/24/2006   Current use of long term anticoagulation    Clopidogrel   Depression    secondary to the death of her husband (died 77)   Diverticulitis    Diverticulosis     Dizzinesses    Dysphagia    Dyspnea    Dysrhythmia    Fatty infiltration of liver    GERD (gastroesophageal reflux disease)    Headache    History of 2019 novel coronavirus disease (COVID-19) 12/09/2020   History of 2019 novel coronavirus disease (COVID-19) 12/20/2020   Hypertension    Hypertriglyceridemia    ILD (interstitial lung disease) ([place])    Lump in the abdomen    OSA on CPAP    Overactive bladder    PSVT (paroxysmal supraventricular tachycardia) (HCC)    Spastic colon    T2DM (type 2 diabetes mellitus) ([place]) 05/2008   Tobacco abuse    Venous insufficiency of both lower extremities     Past Surgical History:  Procedure Laterality Date   ABDOMINAL HYSTERECTOMY  with left ovary in place [place]   gallbladder and Appendix   BREAST BIOPSY Left 10/14/2017   calcs bx, fibrosis giant cell reaction and chronic inflammation, negative for malignancy.    CARPOMETACARPAL (CMC) FUSION OF THUMB Right 07/10/2022   Procedure: CARPOMETACARPAL ([place]) SUSPENSION OF RIGHT THUMB;  Surgeon: Corky Mull, MD;  Location: [facility] ORS;  Service: Orthopedics;  Laterality: Right;   CATARACT EXTRACTION, BILATERAL     CESAREAN SECTION  1984   CHOLECYSTECTOMY  1985   COLONOSCOPY WITH PROPOFOL N/A 09/13/2016  Procedure: COLONOSCOPY WITH PROPOFOL;  Surgeon: Manya Silvas, MD;  Location: [facility] ENDOSCOPY;  Service: Endoscopy;  Laterality: N/A;   COLONOSCOPY WITH PROPOFOL N/A 11/09/2018   Procedure: COLONOSCOPY WITH PROPOFOL;  Surgeon: Manya Silvas, MD;  Location: [facility] ENDOSCOPY;  Service: Endoscopy;  Laterality: N/A;   COLONOSCOPY WITH PROPOFOL N/A 03/29/2020   Procedure: COLONOSCOPY WITH PROPOFOL;  Surgeon: Robert Bellow, MD;  Location: [facility] ENDOSCOPY;  Service: Endoscopy;  Laterality: N/A;   CORONARY ANGIOPLASTY WITH STENT PLACEMENT N/A 07/24/2006   75% pRCA; 3.5 x 28 mm Cypher DES placed; Location: [place]; Surgeons: Katrine Coho, MD   ESOPHAGOGASTRODUODENOSCOPY (EGD)  WITH PROPOFOL N/A 02/02/2018   Procedure: ESOPHAGOGASTRODUODENOSCOPY (EGD) WITH PROPOFOL;  Surgeon: Manya Silvas, MD;  Location: [facility] ENDOSCOPY;  Service: Endoscopy;  Laterality: N/A;   ESOPHAGOGASTRODUODENOSCOPY (EGD) WITH PROPOFOL N/A 03/29/2020   Procedure: ESOPHAGOGASTRODUODENOSCOPY (EGD) WITH PROPOFOL;  Surgeon: Robert Bellow, MD;  Location: [facility] ENDOSCOPY;  Service: Endoscopy;  Laterality: N/A;   EYE SURGERY     JOINT REPLACEMENT     bilateral knee replacements   KNEE ARTHROSCOPY  Arthroscopic left knee surgery    KNEE SURGERY  status post knee surgey    LEFT HEART CATH AND CORONARY ANGIOGRAPHY Left 05/14/2018   Procedure: LEFT HEART CATH AND CORONARY ANGIOGRAPHY;  Surgeon: Corey Skains, MD;  Location: [facility];  Service: Cardiovascular;  Laterality: Left;   REPLACEMENT TOTAL KNEE  ([facility])   SHOULDER SURGERY  shoulder operation secondary to a torn tendon   TOTAL HIP ARTHROPLASTY Left 06/12/2021   Procedure: TOTAL HIP ARTHROPLASTY;  Surgeon: Corky Mull, MD;  Location: [facility] ORS;  Service: Orthopedics;  Laterality: Left;    Family History  Problem Relation Age of Onset   Other Mother        Hit by a fire truck and has had multiple operations on her back , and has history of MVP    Mitral valve prolapse Mother    Lung cancer Mother    Depression Mother    Heart disease Father        myocardial infarction and is status post bypass surgery   Mitral valve prolapse Sister    Bipolar disorder Sister    Hepatitis C Brother    Cirrhosis Brother    Colon cancer Paternal Aunt    Breast cancer Neg Hx    Prostate cancer Neg Hx    Bladder Cancer Neg Hx    Kidney cancer Neg Hx     SOCIAL HX: reviewed.    Current Outpatient Medications:    Accu-Chek Softclix Lancets lancets, Use as instructed, Disp: 100 each, Rfl: 12   acetaminophen (TYLENOL) 325 MG tablet, Take 650 mg by mouth every 6 (six) hours as needed., Disp: , Rfl:    albuterol (VENTOLIN HFA) 108 (90  Base) MCG/ACT inhaler, INHALE 2 PUFFS FOUR TIMES A DAY, Disp: 8.5 g, Rfl: 11   amLODipine (NORVASC) 10 MG tablet, TAKE 1 TABLET EVERY DAY, Disp: 90 tablet, Rfl: 0   Blood Glucose Monitoring Suppl (TRUE METRIX METER) w/Device KIT, , Disp: , Rfl:    Budeson-Glycopyrrol-Formoterol (BREZTRI AEROSPHERE) 160-9-4.8 MCG/ACT AERO, Inhale 2 puffs into the lungs in the morning and at bedtime., Disp: 32.1 g, Rfl: 3   CALCIUM PO, Take 600 mg by mouth daily. , Disp: , Rfl:    cholecalciferol (VITAMIN D3) 25 MCG (1000 UNIT) tablet, Take 1,000 Units by mouth daily., Disp: , Rfl:    Cyanocobalamin (VITAMIN B-12 PO), Take  2,500 mcg by mouth daily., Disp: , Rfl:    DULoxetine (CYMBALTA) 60 MG capsule, TAKE 1 CAPSULE EVERY DAY, Disp: 90 capsule, Rfl: 1   ELIQUIS 5 MG TABS tablet, TAKE 1 TABLET TWICE DAILY, Disp: 180 tablet, Rfl: 0   famotidine (PEPCID) 40 MG tablet, TAKE 1 TABLET EVERY DAY, Disp: 90 tablet, Rfl: 1   HM MELATONIN PO, 5 mg by Other route at bedtime as needed. patch, Disp: , Rfl:    isosorbide mononitrate (IMDUR) 30 MG 24 hr tablet, TAKE 1 TABLET EVERY DAY, Disp: 90 tablet, Rfl: 0   Multiple Vitamin (MULTIVITAMIN) tablet, Take 1 tablet by mouth daily., Disp: , Rfl:    mupirocin ointment (BACTROBAN) 2 %, Apply 1 application. topically 2 (two) times daily., Disp: 22 g, Rfl: 0   nystatin (MYCOSTATIN/NYSTOP) powder, APPLY TO THE AFFECTED AREA(S) TWICE DAILY, Disp: 30 g, Rfl: 0   pantoprazole (PROTONIX) 40 MG tablet, TAKE 1 TABLET TWICE DAILY BEFORE MEALS, Disp: 180 tablet, Rfl: 1   propranolol ER (INDERAL LA) 80 MG 24 hr capsule, Take 1 capsule (80 mg total) by mouth 2 (two) times daily., Disp: 180 capsule, Rfl: 1   QUEtiapine (SEROQUEL) 50 MG tablet, Take 0.5 tablets (25 mg total) by mouth at bedtime., Disp: , Rfl:    rOPINIRole (REQUIP) 3 MG tablet, Take 1 tablet (3 mg total) by mouth at bedtime., Disp: 30 tablet, Rfl: 1   rosuvastatin (CRESTOR) 20 MG tablet, TAKE 1 TABLET EVERY DAY, Disp: 90 tablet, Rfl:  0   Semaglutide,0.25 or 0.5MG/DOS, (OZEMPIC, 0.25 OR 0.5 MG/DOSE,) 2 MG/1.5ML SOPN, Inject 0.25 mg weekly for 4 weeks then increase to 0.5 mg weekly (Patient taking differently: Inject 0.5 mg into the skin once a week. Inject 0.25 mg weekly for 4 weeks then increase to 0.5 mg weekly), Disp: 1.5 mL, Rfl: 2   telmisartan (MICARDIS) 80 MG tablet, TAKE 1 TABLET EVERY DAY, Disp: 90 tablet, Rfl: 1   trimethoprim (TRIMPEX) 100 MG tablet, Take 1 tablet (100 mg total) by mouth daily., Disp: 90 tablet, Rfl: 3   TRUE METRIX BLOOD GLUCOSE TEST test strip, TEST BLOOD SUGAR TWICE DAILY AS DIRECTED, Disp: 200 strip, Rfl: 1   Vibegron (GEMTESA) 75 MG TABS, Take 75 mg by mouth daily., Disp: 90 tablet, Rfl: 3   oxyCODONE (OXY IR/ROXICODONE) 5 MG immediate release tablet, Take 5 mg by mouth every 4 (four) hours as needed., Disp: , Rfl:   EXAM:  VITALS per patient if applicable: 63.8  GENERAL: alert, oriented, appears well and in no acute distress  HEENT: atraumatic, conjunttiva clear, no obvious abnormalities on inspection of external nose and ears  NECK: normal movements of the head and neck  LUNGS: on inspection no signs of respiratory distress, breathing rate appears normal, no obvious gross SOB, gasping or wheezing  CV: no obvious cyanosis  PSYCH/NEURO: pleasant and cooperative, no obvious depression or anxiety, speech and thought processing grossly intact  ASSESSMENT AND PLAN:  Discussed the following assessment and plan:  Problem List Items Addressed This Visit     Aortic atherosclerosis ([place])    Continue lipitor.       CAD (coronary artery disease)    S/p stent placement.  Continue lipitor. Followed by cardiology.  On eliquis.         Carotid artery disease (HCC)    Continue lipitor.  Continue eliquis.        COPD (chronic obstructive pulmonary disease) (HCC)    Sees Dr Raul Del.  Using breztri.  Breathing stable.  No increased sob, cough or congestion.       Diabetes ([place])    On  ozempic.  Tolerating.  Blood sugars a little elevated recently.  Feel related to stress and recent surgery.  Hold on making changes at this time.  Low carb diet and exercise.  Follow met b and a1c.       Diarrhea    Loose stool as outlined.  Consider benefiber.  Probiotics.  Follow. Notify me if persistent.       Essential hypertension, benign     Continue micardis 67m q day and amlodipine 163mq day.  Dr GoRockey Situncreased propranolol. Blood pressures have been under reasonable control.  Blood pressure recently a little elevated.  Feel related to increased stress and recent surgery. Hold on making changes.   Follow pressures.  Follow metabolic panel.       GERD (gastroesophageal reflux disease)    No upper symptoms reported.  On protonix.       Hypercholesterolemia    On crestor.  Low cholesterol diet and exercise.  Follow lipid panel and liver function tests.        ILD (interstitial lung disease) ([place]   Breztri.  Breathing stable.       MDD (major depressive disorder), recurrent episode, moderate ([place]   Being followed by psychiatry.  On cymbalta.  Overall appears to be doing better.  Has a new dog now (Piper).  Follow.       Paroxysmal A-fib ([place]   On eliquis.  Stable.  Continue f/u with cardiology.        Return in about 3 months (around 10/23/2022) for physical.   I discussed the assessment and treatment plan with the patient. The patient was provided an opportunity to ask questions and all were answered. The patient agreed with the plan and demonstrated an understanding of the instructions.   The patient was advised to call back or seek an in-person evaluation if the symptoms worsen or if the condition fails to improve as anticipated.    ChEinar PheasantMD

## 2022-07-24 DIAGNOSIS — M19041 Primary osteoarthritis, right hand: Secondary | ICD-10-CM | POA: Diagnosis not present

## 2022-07-25 ENCOUNTER — Encounter: Payer: Self-pay | Admitting: Internal Medicine

## 2022-07-28 ENCOUNTER — Encounter: Payer: Self-pay | Admitting: Internal Medicine

## 2022-07-28 NOTE — Assessment & Plan Note (Signed)
Continue lipitor  ?

## 2022-07-28 NOTE — Assessment & Plan Note (Signed)
Continue micardis '80mg'$  q day and amlodipine '10mg'$  q day.  Dr Rockey Situ increased propranolol. Blood pressures have been under reasonable control.  Blood pressure recently a little elevated.  Feel related to increased stress and recent surgery. Hold on making changes.   Follow pressures.  Follow metabolic panel.

## 2022-07-28 NOTE — Assessment & Plan Note (Signed)
Loose stool as outlined.  Consider benefiber.  Probiotics.  Follow. Notify me if persistent.

## 2022-07-28 NOTE — Assessment & Plan Note (Signed)
Being followed by psychiatry.  On cymbalta.  Overall appears to be doing better.  Has a new dog now (Piper).  Follow.

## 2022-07-28 NOTE — Assessment & Plan Note (Signed)
No upper symptoms reported.  On protonix.   

## 2022-07-28 NOTE — Assessment & Plan Note (Signed)
S/p stent placement.  Continue lipitor. Followed by cardiology.  On eliquis.    

## 2022-07-28 NOTE — Assessment & Plan Note (Signed)
On ozempic.  Tolerating.  Blood sugars a little elevated recently.  Feel related to stress and recent surgery.  Hold on making changes at this time.  Low carb diet and exercise.  Follow met b and a1c.

## 2022-07-28 NOTE — Assessment & Plan Note (Signed)
On eliquis.  Stable.  Continue f/u with cardiology.  

## 2022-07-28 NOTE — Assessment & Plan Note (Signed)
Sees Dr Fleming.  Using breztri.  Breathing stable.  No increased sob, cough or congestion.  

## 2022-07-28 NOTE — Assessment & Plan Note (Signed)
Continue lipitor.  Continue eliquis.   

## 2022-07-28 NOTE — Assessment & Plan Note (Signed)
Breztri.  Breathing stable.  

## 2022-07-28 NOTE — Assessment & Plan Note (Signed)
On crestor.  Low cholesterol diet and exercise.  Follow lipid panel and liver function tests.   

## 2022-07-30 ENCOUNTER — Encounter: Payer: Self-pay | Admitting: Internal Medicine

## 2022-08-06 ENCOUNTER — Telehealth: Payer: Medicare HMO | Admitting: Psychiatry

## 2022-08-06 ENCOUNTER — Encounter: Payer: Self-pay | Admitting: Psychiatry

## 2022-08-06 DIAGNOSIS — G2581 Restless legs syndrome: Secondary | ICD-10-CM | POA: Diagnosis not present

## 2022-08-06 DIAGNOSIS — F418 Other specified anxiety disorders: Secondary | ICD-10-CM

## 2022-08-06 DIAGNOSIS — G4701 Insomnia due to medical condition: Secondary | ICD-10-CM

## 2022-08-06 DIAGNOSIS — F3342 Major depressive disorder, recurrent, in full remission: Secondary | ICD-10-CM

## 2022-08-06 DIAGNOSIS — Z634 Disappearance and death of family member: Secondary | ICD-10-CM | POA: Diagnosis not present

## 2022-08-06 DIAGNOSIS — F17200 Nicotine dependence, unspecified, uncomplicated: Secondary | ICD-10-CM

## 2022-08-06 MED ORDER — ROPINIROLE HCL 0.5 MG PO TABS: 0.5000 mg | ORAL_TABLET | Freq: Every day | ORAL | 0 refills | Status: DC

## 2022-08-06 MED ORDER — TRAZODONE HCL 100 MG PO TABS: 100.0000 mg | ORAL_TABLET | Freq: Every day | ORAL | 0 refills | Status: DC

## 2022-08-06 NOTE — Patient Instructions (Signed)
Restless Legs Syndrome Restless legs syndrome is a condition that causes uncomfortable feelings or sensations in the legs, especially while sitting or lying down. The sensations usually cause an overwhelming urge to move the legs. The arms can also sometimes be affected. The condition can range from mild to severe. The symptoms often interfere with a person's ability to sleep. What are the causes? The cause of this condition is not known. What increases the risk? The following factors may make you more likely to develop this condition: Being older than 50. Pregnancy. Being a woman. In general, the condition is more common in women than in men. A family history of the condition. Having iron deficiency. Overuse of caffeine, nicotine, or alcohol. Certain medical conditions, such as kidney disease, Parkinson's disease, or nerve damage. Certain medicines, such as those for high blood pressure, nausea, colds, allergies, depression, and some heart conditions. What are the signs or symptoms? The main symptom of this condition is uncomfortable sensations in the legs, such as: Pulling. Tingling. Prickling. Throbbing. Crawling. Burning. Usually, the sensations: Affect both sides of the body. Are worse when you sit or lie down. Are worse at night. These may make it difficult to fall asleep. Make you have a strong urge to move your legs. Are temporarily relieved by moving your legs or standing. The arms can also be affected, but this is rare. People who have this condition often have tiredness during the day because of their lack of sleep at night. How is this diagnosed? This condition may be diagnosed based on: Your symptoms. Blood tests. In some cases, you may be monitored in a sleep lab by a specialist (a sleep study). This can detect any disruptions in your sleep. How is this treated? This condition is treated by managing the symptoms. This may include: Lifestyle changes, such as  exercising, using relaxation techniques, and avoiding caffeine, alcohol, or tobacco. Iron supplements. Medicines. Parkinson's medications may be tried first. Anti-seizure medications can also be helpful. Follow these instructions at home: General instructions Take over-the-counter and prescription medicines only as told by your health care provider. Use methods to help relieve the uncomfortable sensations, such as: Massaging your legs. Walking or stretching. Taking a cold or hot bath. Keep all follow-up visits. This is important. Lifestyle     Practice good sleep habits. For example, go to bed and get up at the same time every day. Most adults should get 7-9 hours of sleep each night. Exercise regularly. Try to get at least 30 minutes of exercise most days of the week. Practice ways of relaxing, such as yoga or meditation. Avoid caffeine and alcohol. Do not use any products that contain nicotine or tobacco. These products include cigarettes, chewing tobacco, and vaping devices, such as e-cigarettes. If you need help quitting, ask your health care provider. Where to find more information National Institute of Neurological Disorders and Stroke: www.ninds.nih.gov Contact a health care provider if: Your symptoms get worse or they do not improve with treatment. Summary Restless legs syndrome is a condition that causes uncomfortable feelings or sensations in the legs, especially while sitting or lying down. The symptoms often interfere with your ability to sleep. This condition is treated by managing the symptoms. You may need to make lifestyle changes or take medicines. This information is not intended to replace advice given to you by your health care provider. Make sure you discuss any questions you have with your health care provider. Document Revised: 06/24/2021 Document Reviewed: 06/24/2021 Elsevier Patient Education    2023 [employer]  

## 2022-08-06 NOTE — Progress Notes (Unsigned)
Virtual Visit via Video Note  I connected with Kathryn Friedman on 08/06/22 at  9:30 AM EDT by a video enabled telemedicine application and verified that I am speaking with the correct person using two identifiers.  Location Provider Location : [facility] Patient Location : Home  Participants: Patient , Provider   I discussed the limitations of evaluation and management by telemedicine and the availability of in person appointments. The patient expressed understanding and agreed to proceed.   I discussed the assessment and treatment plan with the patient. The patient was provided an opportunity to ask questions and all were answered. The patient agreed with the plan and demonstrated an understanding of the instructions.   The patient was advised to call back or seek an in-person evaluation if the symptoms worsen or if the condition fails to improve as anticipated.    Miguel Barrera MD OP Progress Note  08/06/2022 9:52 AM Kathryn Friedman  MRN:  [id number]  Chief Complaint:  Chief Complaint  Patient presents with   Follow-up: 66 year old Caucasian female with history of depression, anxiety, bereavement, insomnia presented for medication management.   HPI: Kathryn Friedman is a 66 year old Caucasian female, widowed, retired, on [employer], lives in [place], has a history of MDD, insomnia, obstructive sleep apnea on CPAP, eye surgery, coronary artery disease status post stent placement, COPD, diabetes mellitus, hypertension, GERD, interstitial lung disease, iron deficiency was evaluated by telemedicine today.  Patient today reports she currently recovering from her surgery, reviewed notes per Dr.Poggi -patient status post right carpometacarpal suspension of right thumb.  Patient reports she does not have a lot of pain and is recovering well.  Does have upcoming appointment with her provider coming up.  Patient reports she is coping with her grief better than before.  She was able to adopt a 62-monthold  puppy, reports that does keep her busy.  That has been therapeutic.  Patient although anxious reports she is coping well and it does not affect her daily functioning.  Patient denies any appetite changes.  Denies any suicidality, homicidality or perceptual disturbances.  Currently compliant on her medications like duloxetine.  Reports she continues to have sleep problems, restless leg symptoms also continues to be a problem and this likely also affecting sleep.  Currently taking Seroquel half of 25 mg as well as melatonin patch.  Current used to smoke cigarettes, however reports she is working on cutting back and has upcoming appointment with smoking cessation clinic.  Patient denies any other concerns today.  Visit Diagnosis:    ICD-10-CM   1. MDD (major depressive disorder), recurrent, in full remission ([facility]  F33.42 traZODone (DESYREL) 100 MG tablet    2. Insomnia due to medical condition  G47.01 traZODone (DESYREL) 100 MG tablet    rOPINIRole (REQUIP) 0.5 MG tablet    traZODone (DESYREL) 100 MG tablet    rOPINIRole (REQUIP) 0.5 MG tablet   OSA,RLS,mood    3. Other specified anxiety disorders  F41.8    Limited symptom attacks    4. Restless leg syndrome  G25.81 rOPINIRole (REQUIP) 0.5 MG tablet    traZODone (DESYREL) 100 MG tablet    rOPINIRole (REQUIP) 0.5 MG tablet    5. Bereavement  Z63.4     6. Tobacco use disorder  F17.200       Past Psychiatric History: Past psychiatric history from progress note on 06/26/2020.  Past trials of trazodone, Cymbalta, Lexapro, Rexulti, doxepin, gabapentin, Lunesta.  Patient was previously under the care of [facility]  Past Medical History:  Past Medical History:  Diagnosis Date   Anemia    Anginal pain (HCC)    Anxiety    Arthritis    back and knees   Asthma    Bilateral carotid artery stenosis    Blood in stool    Chronic diarrhea    COPD (chronic obstructive pulmonary disease) (HCC)    Coronary artery disease     a.) 75% pRCA; 3.5 x 28 mm Cypher DES placed on 07/24/2006   Current use of long term anticoagulation    Clopidogrel   Depression    secondary to the death of her husband (died 57)   Diverticulitis    Diverticulosis    Dizzinesses    Dysphagia    Dyspnea    Dysrhythmia    Fatty infiltration of liver    GERD (gastroesophageal reflux disease)    Headache    History of 2019 novel coronavirus disease (COVID-19) 12/09/2020   History of 2019 novel coronavirus disease (COVID-19) 12/20/2020   Hypertension    Hypertriglyceridemia    ILD (interstitial lung disease) ([place])    Lump in the abdomen    OSA on CPAP    Overactive bladder    PSVT (paroxysmal supraventricular tachycardia) (HCC)    Spastic colon    T2DM (type 2 diabetes mellitus) ([place]) 05/2008   Tobacco abuse    Venous insufficiency of both lower extremities     Past Surgical History:  Procedure Laterality Date   ABDOMINAL HYSTERECTOMY  with left ovary in place [place]   gallbladder and Appendix   BREAST BIOPSY Left 10/14/2017   calcs bx, fibrosis giant cell reaction and chronic inflammation, negative for malignancy.    CARPOMETACARPAL ([facility]) FUSION OF THUMB Right 07/10/2022   Procedure: CARPOMETACARPAL ([place]) SUSPENSION OF RIGHT THUMB;  Surgeon: Corky Mull, MD;  Location: [facility] ORS;  Service: Orthopedics;  Laterality: Right;   CATARACT EXTRACTION, BILATERAL     CESAREAN SECTION  1984   CHOLECYSTECTOMY  1985   COLONOSCOPY WITH PROPOFOL N/A 09/13/2016   Procedure: COLONOSCOPY WITH PROPOFOL;  Surgeon: Manya Silvas, MD;  Location: [facility] ENDOSCOPY;  Service: Endoscopy;  Laterality: N/A;   COLONOSCOPY WITH PROPOFOL N/A 11/09/2018   Procedure: COLONOSCOPY WITH PROPOFOL;  Surgeon: Manya Silvas, MD;  Location[facility] ENDOSCOPY;  Service: Endoscopy;  Laterality: N/A;   COLONOSCOPY WITH PROPOFOL N/A 03/29/2020   Procedure: COLONOSCOPY WITH PROPOFOL;  Surgeon: Robert Bellow, MD;  Location: [facility]  ENDOSCOPY;  Service: Endoscopy;  Laterality: N/A;   CORONARY ANGIOPLASTY WITH STENT PLACEMENT N/A 07/24/2006   75% pRCA; 3.5 x 28 mm Cypher DES placed; Location: [place]; Surgeons: Katrine Coho, MD   ESOPHAGOGASTRODUODENOSCOPY (EGD) WITH PROPOFOL N/A 02/02/2018   Procedure: ESOPHAGOGASTRODUODENOSCOPY (EGD) WITH PROPOFOL;  Surgeon: Manya Silvas, MD;  Location[facility] ENDOSCOPY;  Service: Endoscopy;  Laterality: N/A;   ESOPHAGOGASTRODUODENOSCOPY (EGD) WITH PROPOFOL N/A 03/29/2020   Procedure: ESOPHAGOGASTRODUODENOSCOPY (EGD) WITH PROPOFOL;  Surgeon: Robert Bellow, MD;  Location: [facility] ENDOSCOPY;  Service: Endoscopy;  Laterality: N/A;   EYE SURGERY     JOINT REPLACEMENT     bilateral knee replacements   KNEE ARTHROSCOPY  Arthroscopic left knee surgery    KNEE SURGERY  status post knee surgey    LEFT HEART CATH AND CORONARY ANGIOGRAPHY Left 05/14/2018   Procedure: LEFT HEART CATH AND CORONARY ANGIOGRAPHY;  Surgeon: Corey Skains, MD;  Location: [place] CV LAB;  Service: Cardiovascular;  Laterality: Left;  REPLACEMENT TOTAL KNEE  ([facility])   SHOULDER SURGERY  shoulder operation secondary to a torn tendon   TOTAL HIP ARTHROPLASTY Left 06/12/2021   Procedure: TOTAL HIP ARTHROPLASTY;  Surgeon: Corky Mull, MD;  Location: [facility] ORS;  Service: Orthopedics;  Laterality: Left;    Family Psychiatric History: Reviewed family psychiatric history from progress note on 06/26/2020.  Family History:  Family History  Problem Relation Age of Onset   Other Mother        Hit by a fire truck and has had multiple operations on her back , and has history of MVP    Mitral valve prolapse Mother    Lung cancer Mother    Depression Mother    Heart disease Father        myocardial infarction and is status post bypass surgery   Mitral valve prolapse Sister    Bipolar disorder Sister    Hepatitis C Brother    Cirrhosis Brother    Colon cancer Paternal Aunt    Breast cancer Neg Hx    Prostate cancer  Neg Hx    Bladder Cancer Neg Hx    Kidney cancer Neg Hx     Social History: Reviewed social history from progress note on 06/26/2020. Social History   Socioeconomic History   Marital status: Widowed    Spouse name: Annahi Short   Number of children: 1   Years of education: 12   Highest education level: 12th grade  Occupational History    Employer: [employer]  Tobacco Use   Smoking status: Every Day    Packs/day: 2.00    Years: 45.00    Total pack years: 90.00    Types: Cigarettes    Passive exposure: Current   Smokeless tobacco: Never   Tobacco comments:    Patient reported she is not ready to quit at this time due to recent loss of her husband.   Vaping Use   Vaping Use: Some days   Substances: Flavoring  Substance and Sexual Activity   Alcohol use: No    Alcohol/week: 0.0 standard drinks of alcohol   Drug use: No   Sexual activity: Not Currently  Other Topics Concern   Not on file  Social History Narrative   Not on file   Social Determinants of Health   Financial Resource Strain: Medium Risk (01/11/2022)   Overall Financial Resource Strain (CARDIA)    Difficulty of Paying Living Expenses: Somewhat hard  Food Insecurity: No Food Insecurity (05/03/2022)   Hunger Vital Sign    Worried About Running Out of Food in the Last Year: Never true    Ran Out of Food in the Last Year: Never true  Transportation Needs: No Transportation Needs (05/03/2022)   PRAPARE - Hydrologist (Medical): No    Lack of Transportation (Non-Medical): No  Physical Activity: Inactive (05/03/2022)   Exercise Vital Sign    Days of Exercise per Week: 0 days    Minutes of Exercise per Session: 0 min  Stress: No Stress Concern Present (05/03/2022)   [place]    Feeling of Stress : Not at all  Social Connections: Moderately Isolated (05/03/2022)   Social Connection and Isolation Panel [NHANES]    Frequency of  Communication with Friends and Family: More than three times a week    Frequency of Social Gatherings with Friends and Family: More than three times a week    Attends Religious Services:  1 to 4 times per year    Active Member of Clubs or Organizations: No    Attends Archivist Meetings: Never    Marital Status: Widowed    Allergies:  Allergies  Allergen Reactions   Varenicline Other (See Comments)    "I got really depressed" "I got really depressed" CHANTEX   Varenicline Tartrate    Jardiance [Empagliflozin] Other (See Comments)    Yeast infection   Metformin And Related Other (See Comments)    Diarrhea, even with XR   Methylprednisolone Nausea Only and Nausea And Vomiting    Metabolic Disorder Labs: Lab Results  Component Value Date   HGBA1C 7.0 (H) 06/17/2022   No results found for: "PROLACTIN" Lab Results  Component Value Date   CHOL 114 06/17/2022   TRIG 94.0 06/17/2022   HDL 49.70 06/17/2022   CHOLHDL 2 06/17/2022   VLDL 18.8 06/17/2022   LDLCALC 46 06/17/2022   LDLCALC 62 03/05/2022   Lab Results  Component Value Date   TSH 1.23 06/17/2022   TSH 0.87 07/06/2021    Therapeutic Level Labs: No results found for: "LITHIUM" No results found for: "VALPROATE" No results found for: "CBMZ"  Current Medications: Current Outpatient Medications  Medication Sig Dispense Refill   Accu-Chek Softclix Lancets lancets Use as instructed 100 each 12   acetaminophen (TYLENOL) 325 MG tablet Take 650 mg by mouth every 6 (six) hours as needed.     albuterol (VENTOLIN HFA) 108 (90 Base) MCG/ACT inhaler INHALE 2 PUFFS FOUR TIMES A DAY 8.5 g 11   amLODipine (NORVASC) 10 MG tablet TAKE 1 TABLET EVERY DAY 90 tablet 0   Blood Glucose Monitoring Suppl (TRUE METRIX METER) w/Device KIT      Budeson-Glycopyrrol-Formoterol (BREZTRI AEROSPHERE) 160-9-4.8 MCG/ACT AERO Inhale 2 puffs into the lungs in the morning and at bedtime. 32.1 g 3   CALCIUM PO Take 600 mg by mouth daily.       cholecalciferol (VITAMIN D3) 25 MCG (1000 UNIT) tablet Take 1,000 Units by mouth daily.     Cyanocobalamin (VITAMIN B-12 PO) Take 2,500 mcg by mouth daily.     DULoxetine (CYMBALTA) 60 MG capsule TAKE 1 CAPSULE EVERY DAY 90 capsule 1   ELIQUIS 5 MG TABS tablet TAKE 1 TABLET TWICE DAILY 180 tablet 0   famotidine (PEPCID) 40 MG tablet TAKE 1 TABLET EVERY DAY 90 tablet 1   HM MELATONIN PO 5 mg by Other route at bedtime as needed. patch     isosorbide mononitrate (IMDUR) 30 MG 24 hr tablet TAKE 1 TABLET EVERY DAY 90 tablet 0   Multiple Vitamin (MULTIVITAMIN) tablet Take 1 tablet by mouth daily.     mupirocin ointment (BACTROBAN) 2 % Apply 1 application. topically 2 (two) times daily. 22 g 0   nystatin (MYCOSTATIN/NYSTOP) powder APPLY TO THE AFFECTED AREA(S) TWICE DAILY 30 g 0   pantoprazole (PROTONIX) 40 MG tablet TAKE 1 TABLET TWICE DAILY BEFORE MEALS 180 tablet 1   propranolol ER (INDERAL LA) 80 MG 24 hr capsule Take 1 capsule (80 mg total) by mouth 2 (two) times daily. 180 capsule 1   rOPINIRole (REQUIP) 0.5 MG tablet Take 1 tablet (0.5 mg total) by mouth daily after lunch. 30 tablet 0   rOPINIRole (REQUIP) 0.5 MG tablet Take 1 tablet (0.5 mg total) by mouth daily after lunch. 90 tablet 0   rOPINIRole (REQUIP) 3 MG tablet Take 1 tablet (3 mg total) by mouth at bedtime. 30 tablet 1   rosuvastatin (  CRESTOR) 20 MG tablet TAKE 1 TABLET EVERY DAY 90 tablet 0   Semaglutide,0.25 or 0.5MG/DOS, (OZEMPIC, 0.25 OR 0.5 MG/DOSE,) 2 MG/1.5ML SOPN Inject 0.25 mg weekly for 4 weeks then increase to 0.5 mg weekly (Patient taking differently: Inject 0.5 mg into the skin once a week. Inject 0.25 mg weekly for 4 weeks then increase to 0.5 mg weekly) 1.5 mL 2   telmisartan (MICARDIS) 80 MG tablet TAKE 1 TABLET EVERY DAY 90 tablet 1   traZODone (DESYREL) 100 MG tablet Take 1 tablet (100 mg total) by mouth at bedtime. Stop seroquel 30 tablet 0   traZODone (DESYREL) 100 MG tablet Take 1 tablet (100 mg total) by mouth  at bedtime. Stop seroquel 90 tablet 0   trimethoprim (TRIMPEX) 100 MG tablet Take 1 tablet (100 mg total) by mouth daily. 90 tablet 3   TRUE METRIX BLOOD GLUCOSE TEST test strip TEST BLOOD SUGAR TWICE DAILY AS DIRECTED 200 strip 1   Vibegron (GEMTESA) 75 MG TABS Take 75 mg by mouth daily. 90 tablet 3   No current facility-administered medications for this visit.     Musculoskeletal: Strength & Muscle Tone:  UTA Gait & Station:  Seated Patient leans: N/A  Psychiatric Specialty Exam: Review of Systems  Musculoskeletal:        S/P surgery rt.sided thumb  Psychiatric/Behavioral:  Positive for sleep disturbance. The patient is nervous/anxious.        Grieving-improving  All other systems reviewed and are negative.   There were no vitals taken for this visit.There is no height or weight on file to calculate BMI.  General Appearance: Casual  Eye Contact:  Fair  Speech:  Clear and Coherent  Volume:  Normal  Mood:  Anxious, grieving-improving  Affect:  Congruent  Thought Process:  Goal Directed and Descriptions of Associations: Intact  Orientation:  Full (Time, Place, and Person)  Thought Content: Logical   Suicidal Thoughts:  No  Homicidal Thoughts:  No  Memory:  Immediate;   Fair Recent;   Fair Remote;   Fair  Judgement:  Fair  Insight:  Fair  Psychomotor Activity:  Normal  Concentration:  Concentration: Fair and Attention Span: Fair  Recall:  [employer] of Knowledge: Fair  Language: Fair  Akathisia:  No  Handed:  Right  AIMS (if indicated): not done  Assets:  Communication Skills Desire for Improvement Housing Social Support Talents/Skills  ADL's:  Intact  Cognition: WNL  Sleep:  Poor   Screenings: [place] Office Visit from 05/14/2022 in [place] Video Visit from 04/03/2022 in [place] Total Score 0 0      GAD-7    Flowsheet Row Video Visit from 06/25/2021 in [place] Video Visit from 05/03/2021 in [place] Video Visit from 04/05/2021 in [place]  Total GAD-7 Score '6 3 9      ' PHQ2-9    Flowsheet Row Video Visit from 08/06/2022 in [place] Video Visit from 06/24/2022 in [facility] Office Visit from 05/23/2022 in [facility] Office Visit from 05/14/2022 in [place] Video Visit from 04/10/2022 in [place]  PHQ-2 Total Score 0 '6 1 1 2  ' PHQ-9 Total Score -- 24 -- 4 5      Flowsheet Row Video Visit from 08/06/2022 in [place] Admission (Discharged) from 07/10/2022 in [place]  [place] Testing 45 from 07/09/2022 in [place] TESTING  C-SSRS RISK CATEGORY Low Risk Error: Question 6 not populated Error: Q3, 4, or 5 should not be populated when Q2 is No        Assessment and Plan: Kathryn Friedman is a 66 year old Caucasian female who has a history of MDD, anxiety disorder, bereavement, sleep problems, presented for medication management.  Patient continues to struggle with sleep problems likely due to restless leg symptoms, likely also exacerbated by her smoking as well as medications like Seroquel.  Discussed plan as noted below.  Plan MDD in full remission Cymbalta 60 mg p.o. daily. Discontinue Seroquel likely affecting her restless leg symptoms.  Other specified anxiety disorder-stable Cymbalta 60 mg p.o. daily Patient was referred for CBT in the past.  Insomnia-unstable due to RLS Discontinue Seroquel likely making it worse. Continue CPAP for OSA  RLS-unstable Continue Requip 3 mg at bedtime, add 0.5 mg in the afternoon.  Bereavement-improving Patient was referred for grief counseling.  Tobacco use disorder-unstable Patient has  upcoming appointment with smoking cessation clinic Provided counseling for 1 minute.  Follow-up in clinic in 3 to 4 weeks or sooner if needed.  This note was generated in part or whole with voice recognition software. Voice recognition is usually quite accurate but there are transcription errors that can and very often do occur. I apologize for any typographical errors that were not detected and corrected.    Consent: Patient/Guardian gives verbal consent for treatment and assignment of benefits for services provided during this visit. Patient/Guardian expressed understanding and agreed to proceed.   This note was generated in part or whole with voice recognition software. Voice recognition is usually quite accurate but there are transcription errors that can and very often do occur. I apologize for any typographical errors that were not detected and corrected.      Ursula Alert, MD 08/06/2022, 9:52 AM

## 2022-08-07 ENCOUNTER — Encounter: Payer: Self-pay | Admitting: Internal Medicine

## 2022-08-12 ENCOUNTER — Ambulatory Visit: Payer: Medicare HMO | Admitting: Physician Assistant

## 2022-08-12 DIAGNOSIS — Z79899 Other long term (current) drug therapy: Secondary | ICD-10-CM | POA: Diagnosis not present

## 2022-08-12 DIAGNOSIS — E119 Type 2 diabetes mellitus without complications: Secondary | ICD-10-CM | POA: Diagnosis not present

## 2022-08-12 DIAGNOSIS — F339 Major depressive disorder, recurrent, unspecified: Secondary | ICD-10-CM | POA: Diagnosis not present

## 2022-08-12 DIAGNOSIS — I10 Essential (primary) hypertension: Secondary | ICD-10-CM | POA: Diagnosis not present

## 2022-08-12 DIAGNOSIS — J449 Chronic obstructive pulmonary disease, unspecified: Secondary | ICD-10-CM | POA: Diagnosis not present

## 2022-08-13 ENCOUNTER — Encounter: Payer: Self-pay | Admitting: Internal Medicine

## 2022-08-14 DIAGNOSIS — F17200 Nicotine dependence, unspecified, uncomplicated: Secondary | ICD-10-CM | POA: Insufficient documentation

## 2022-08-14 DIAGNOSIS — I10 Essential (primary) hypertension: Secondary | ICD-10-CM | POA: Diagnosis not present

## 2022-08-14 DIAGNOSIS — E119 Type 2 diabetes mellitus without complications: Secondary | ICD-10-CM | POA: Diagnosis not present

## 2022-08-14 DIAGNOSIS — I872 Venous insufficiency (chronic) (peripheral): Secondary | ICD-10-CM | POA: Diagnosis not present

## 2022-08-14 DIAGNOSIS — Z716 Tobacco abuse counseling: Secondary | ICD-10-CM | POA: Diagnosis not present

## 2022-08-14 DIAGNOSIS — I25119 Atherosclerotic heart disease of native coronary artery with unspecified angina pectoris: Secondary | ICD-10-CM | POA: Diagnosis not present

## 2022-08-14 DIAGNOSIS — J449 Chronic obstructive pulmonary disease, unspecified: Secondary | ICD-10-CM | POA: Diagnosis not present

## 2022-08-14 DIAGNOSIS — F17210 Nicotine dependence, cigarettes, uncomplicated: Secondary | ICD-10-CM | POA: Diagnosis not present

## 2022-08-15 DIAGNOSIS — R0609 Other forms of dyspnea: Secondary | ICD-10-CM | POA: Diagnosis not present

## 2022-08-15 DIAGNOSIS — G4733 Obstructive sleep apnea (adult) (pediatric): Secondary | ICD-10-CM | POA: Diagnosis not present

## 2022-08-15 DIAGNOSIS — M19041 Primary osteoarthritis, right hand: Secondary | ICD-10-CM | POA: Diagnosis not present

## 2022-08-15 DIAGNOSIS — F17210 Nicotine dependence, cigarettes, uncomplicated: Secondary | ICD-10-CM | POA: Diagnosis not present

## 2022-08-15 DIAGNOSIS — J432 Centrilobular emphysema: Secondary | ICD-10-CM | POA: Diagnosis not present

## 2022-08-15 DIAGNOSIS — Z9989 Dependence on other enabling machines and devices: Secondary | ICD-10-CM | POA: Diagnosis not present

## 2022-08-16 ENCOUNTER — Ambulatory Visit: Payer: Medicare HMO | Admitting: Physician Assistant

## 2022-08-19 ENCOUNTER — Ambulatory Visit (INDEPENDENT_AMBULATORY_CARE_PROVIDER_SITE_OTHER): Payer: Medicare HMO | Admitting: Physician Assistant

## 2022-08-19 ENCOUNTER — Encounter: Payer: Self-pay | Admitting: Physician Assistant

## 2022-08-19 ENCOUNTER — Telehealth: Payer: Self-pay

## 2022-08-19 VITALS — Ht 64.0 in | Wt 206.0 lb

## 2022-08-19 DIAGNOSIS — G2581 Restless legs syndrome: Secondary | ICD-10-CM

## 2022-08-19 DIAGNOSIS — N3281 Overactive bladder: Secondary | ICD-10-CM | POA: Diagnosis not present

## 2022-08-19 MED ORDER — ROPINIROLE HCL 3 MG PO TABS: 3.0000 mg | ORAL_TABLET | Freq: Every day | ORAL | 1 refills | Status: DC

## 2022-08-19 NOTE — Telephone Encounter (Signed)
Pt.notified

## 2022-08-19 NOTE — Telephone Encounter (Signed)
I have sent requip 3 mg to pharmacy.  At [facility].

## 2022-08-19 NOTE — Telephone Encounter (Signed)
pt called states she wanted to go ahead and get refills on the ropinrole '3mg'$  because it takes [facility] so long to mail out that she will run out before she gets it next time.  Pt was last seen on 08-06-22 next appt 08-30-22    Disp Refills Start End   rOPINIRole (REQUIP) 3 MG tablet 30 tablet 1 07/02/2022    Sig - Route: Take 1 tablet (3 mg total) by mouth at bedtime. - Oral   Sent to pharmacy as: rOPINIRole (REQUIP) 3 MG tablet   E-Prescribing Status: Receipt confirmed by pharmacy (07/02/2022  5:22 PM EDT)

## 2022-08-19 NOTE — Progress Notes (Signed)
Virtual Visit via Telephone Note  I connected with Kathryn Friedman on 08/19/22 at  2:30 PM EDT by telephone and verified that I am speaking with the correct person using two identifiers.  Location: Patient: Home in [place], [place] Provider: Clinic in [place], [place]   I discussed the limitations, risks, security and privacy concerns of performing an evaluation and management service by telephone and the availability of in person appointments. I also discussed with the patient that there may be a patient responsible charge related to this service. The patient expressed understanding and agreed to proceed.   History of Present Illness: Kathryn Friedman is a 66 y.o. female with PMH refractory OAB wet with mixed incontinence on Gemtesa and trimethoprim daily who presents today to discuss bladder management after having completed PTNS x12 1 month ago.  Today she reports she is not sure that the PTNS made a significant improvement in her urinary symptoms.  On review of her reported symptoms, daytime voids increased from 6 to 9 daily, nighttime voids decreased from 4 to 2 daily, and incontinence episodes remained stable at 2 daily.  Notably, she cut back on caffeinated beverages around treatment #5 and had significant symptomatic improvement with this.  Today she reports she is only drinking 1 cup of coffee daily.  She remains significantly bothered by her urinary symptoms and wonders what other treatments are available to her.  She previously declined consideration of InterStim because she cannot drive to [place].  She reaffirms today that she cannot get to [place].   Observations/Objective: Asking appropriate questions, no acute distress  Assessment and Plan: 1. Overactive bladder No significant improvement on PTNS.  Cannot get to [facility] for trial of sacral neuromodulation.  We discussed consideration of intravesical Botox in the rare risk of incomplete bladder emptying with this  therapy.  She wants to think this over before proceeding.  We will schedule her to follow-up with Dr. Matilde Sprang to discuss this further.  In the meantime, we will discontinue PTNS given lack of efficacy.  Okay to continue Gemtesa and trimethoprim in the interim.  She is in agreement with this plan.  Follow Up Instructions: Return in about 2 weeks (around 09/02/2022) for OAB f/u with Dr. Matilde Sprang to discuss intravesical Botox.    I discussed the assessment and treatment plan with the patient. The patient was provided an opportunity to ask questions and all were answered. The patient agreed with the plan and demonstrated an understanding of the instructions.   The patient was advised to call back or seek an in-person evaluation if the symptoms worsen or if the condition fails to improve as anticipated.  I provided 17 minutes of non-face-to-face time during this encounter.   Debroah Loop, PA-C

## 2022-08-20 ENCOUNTER — Telehealth: Payer: Self-pay | Admitting: Internal Medicine

## 2022-08-20 ENCOUNTER — Encounter: Payer: Self-pay | Admitting: Internal Medicine

## 2022-08-20 NOTE — Telephone Encounter (Signed)
Pt called stating she would like for the provider to fax a cpap machine prescription to [facility] and she would also like for the provider to schedule a sleep study at home with a full face mask. Pt would like to be called  Cpap machine-resmed airscent 10

## 2022-08-21 NOTE — Telephone Encounter (Signed)
Just FYI patient stated that she just saw Dr. Raul Del & that she has been trying to get sleep study done since April. She used [facility] & was not happy with his staff. She stated that she liked him but no one else does any work & she is tired of trying. She proceeded to hang up the phone.

## 2022-08-21 NOTE — Telephone Encounter (Signed)
Pt notified & will be looking out for their phone call.

## 2022-08-21 NOTE — Telephone Encounter (Signed)
Pt returning call

## 2022-08-21 NOTE — Telephone Encounter (Signed)
Please notify her that I called and spoke to Dr Leane Platt office and they are going to speak to Dr Raul Del and get back with her.

## 2022-08-21 NOTE — Telephone Encounter (Signed)
Pt called and notified to continue to monitor. She stated that she would & she will report back to [place] the beginning of next week.

## 2022-08-21 NOTE — Telephone Encounter (Signed)
Please call and notify her that her blood sugars are varying.  Continue low carb diet and exercise as tolerated.  We will continue to follow.  Some of the blood pressures increased, but I am going to hold on making any changes.  Continue to follow.

## 2022-08-21 NOTE — Telephone Encounter (Signed)
She sees pulmonary - Dr Raul Del.  Will need to contact their office - to schedule and interpret home sleep test, etc.

## 2022-08-21 NOTE — Telephone Encounter (Signed)
LMTCB

## 2022-08-26 DIAGNOSIS — M542 Cervicalgia: Secondary | ICD-10-CM | POA: Diagnosis not present

## 2022-08-26 DIAGNOSIS — M47812 Spondylosis without myelopathy or radiculopathy, cervical region: Secondary | ICD-10-CM | POA: Diagnosis not present

## 2022-08-27 ENCOUNTER — Telehealth: Payer: Self-pay

## 2022-08-27 ENCOUNTER — Ambulatory Visit: Payer: Medicare HMO | Admitting: Occupational Therapy

## 2022-08-27 ENCOUNTER — Encounter: Payer: Self-pay | Admitting: Internal Medicine

## 2022-08-27 NOTE — Telephone Encounter (Signed)
If we consider switching to Wellbutrin we will have to make changes with her duloxetine or Cymbalta.  Hence it requires medication dosage readjustment of her other medications due to drug to drug interaction I would recommend we discuss this at her visit.  Please let patient know.

## 2022-08-27 NOTE — Telephone Encounter (Signed)
wants to know if she can switch to wellbutrin for smoking cessation

## 2022-08-28 DIAGNOSIS — F17290 Nicotine dependence, other tobacco product, uncomplicated: Secondary | ICD-10-CM | POA: Diagnosis not present

## 2022-08-28 NOTE — Telephone Encounter (Signed)
pt notified and is ok to wait until her appt.

## 2022-08-29 ENCOUNTER — Encounter: Payer: Self-pay | Admitting: Internal Medicine

## 2022-08-29 NOTE — Telephone Encounter (Signed)
Noted  

## 2022-08-29 NOTE — Telephone Encounter (Signed)
Agree that if a change, would recommend evaluation.  Please confirm no other acute issues, worsening sob, chest pain, etc.

## 2022-08-29 NOTE — Telephone Encounter (Signed)
Meds will be given at appt  Dr. Nicki Reaper is not here never seen pt needs evaluation

## 2022-08-29 NOTE — Telephone Encounter (Signed)
I called patient and she confirmed no chest pain or shortness of breath. No left arm pain, dizziness, HA or vision changes. Her blood sugar this morning was 140 mg/dL & BP was 133/74 P 70. She had cancelled appointment with Dr. Olivia Mackie but was able to schedule patient with only appointment we had in office with Dr. Derrel Nip at 4:30 tomorrow.

## 2022-08-30 ENCOUNTER — Encounter: Payer: Self-pay | Admitting: Psychiatry

## 2022-08-30 ENCOUNTER — Telehealth: Payer: Medicare HMO | Admitting: Psychiatry

## 2022-08-30 ENCOUNTER — Ambulatory Visit: Payer: Medicare HMO | Admitting: Internal Medicine

## 2022-08-30 DIAGNOSIS — F418 Other specified anxiety disorders: Secondary | ICD-10-CM

## 2022-08-30 DIAGNOSIS — G2581 Restless legs syndrome: Secondary | ICD-10-CM | POA: Diagnosis not present

## 2022-08-30 DIAGNOSIS — F3342 Major depressive disorder, recurrent, in full remission: Secondary | ICD-10-CM | POA: Diagnosis not present

## 2022-08-30 DIAGNOSIS — G4701 Insomnia due to medical condition: Secondary | ICD-10-CM

## 2022-08-30 DIAGNOSIS — Z634 Disappearance and death of family member: Secondary | ICD-10-CM | POA: Diagnosis not present

## 2022-08-30 DIAGNOSIS — F17210 Nicotine dependence, cigarettes, uncomplicated: Secondary | ICD-10-CM

## 2022-08-30 DIAGNOSIS — F17200 Nicotine dependence, unspecified, uncomplicated: Secondary | ICD-10-CM

## 2022-08-30 NOTE — Progress Notes (Signed)
Virtual Visit via Video Note  I connected with Kathryn Friedman on 08/30/22 at 10:00 AM EDT by a video enabled telemedicine application and verified that I am speaking with the correct person using two identifiers.  Location Provider Location : [facility] Patient Location : Home  Participants: Patient , Provider    I discussed the limitations of evaluation and management by telemedicine and the availability of in person appointments. The patient expressed understanding and agreed to proceed.    I discussed the assessment and treatment plan with the patient. The patient was provided an opportunity to ask questions and all were answered. The patient agreed with the plan and demonstrated an understanding of the instructions.   The patient was advised to call back or seek an in-person evaluation if the symptoms worsen or if the condition fails to improve as anticipated.    BH MD/PA/NP OP Progress Note  08/30/2022 12:53 PM Kathryn Friedman  MRN:  [id number]  Chief Complaint:  Chief Complaint  Patient presents with   Follow-up   Depression   HPI: Kathryn Friedman is a 66 year old Caucasian female, widowed, retired on [employer], lives in [place], has a history of MDD, insomnia, obstructive sleep apnea on CPAP, eye surgery, coronary artery disease status post stent placement, COPD, diabetes mellitus, hypertension, GERD, interstitial lung disease, iron deficiency was evaluated by telemedicine today.  Patient today reports she is currently doing well with regards to her restless leg symptoms since she is on an additional dosage of Requip.  Denies side effects.  Sleep has improved.  She however reports she has pets who wake her up early morning.  That does affect her sleep.  However she believes she is getting enough sleep.  She continues to grieve her husband it being the wedding anniversary today.  Patient reports her housekeeper is coming in today and she is planning to stay home.  She is coping  with her grief better than before.  Patient denies any significant anxiety or depressive symptoms.  Patient denies any suicidality, homicidality or perceptual disturbances.  Reports she had arthralgia, neck pain recently and was started on tramadol and tizanidine and that seems to help.  Patient denies any other concerns today.  Visit Diagnosis:    ICD-10-CM   1. MDD (major depressive disorder), recurrent, in full remission ([name])  F33.42     2. Insomnia due to medical condition  G47.01    OSA, RLS    3. Restless leg syndrome  G25.81     4. Other specified anxiety disorders  F41.8    Limited symptom attacks    5. Bereavement  Z63.4     6. Tobacco use disorder  F17.200       Past Psychiatric History: Reviewed past psychiatric history from progress note on 06/26/2020.  Past trials of trazodone, Cymbalta, Lexapro, Rexulti, doxepin, gabapentin, Lunesta.  Patient was previously under the care of [employer].  Past Medical History:  Past Medical History:  Diagnosis Date   Anemia    Anginal pain (HCC)    Anxiety    Arthritis    back and knees   Asthma    Bilateral carotid artery stenosis    Blood in stool    Chronic diarrhea    COPD (chronic obstructive pulmonary disease) (HCC)    Coronary artery disease    a.) 75% pRCA; 3.5 x 28 mm Cypher DES placed on 07/24/2006   Current use of long term anticoagulation    Clopidogrel  Depression    secondary to the death of her husband (died 69)   Diverticulitis    Diverticulosis    Dizzinesses    Dysphagia    Dyspnea    Dysrhythmia    Fatty infiltration of liver    GERD (gastroesophageal reflux disease)    Headache    History of 2019 novel coronavirus disease (COVID-19) 12/09/2020   History of 2019 novel coronavirus disease (COVID-19) 12/20/2020   Hypertension    Hypertriglyceridemia    ILD (interstitial lung disease) ([place])    Lump in the abdomen    OSA on CPAP    Overactive bladder    PSVT (paroxysmal  supraventricular tachycardia)    Spastic colon    T2DM (type 2 diabetes mellitus) ([place]) 05/2008   Tobacco abuse    Venous insufficiency of both lower extremities     Past Surgical History:  Procedure Laterality Date   ABDOMINAL HYSTERECTOMY  with left ovary in place [place]   gallbladder and Appendix   BREAST BIOPSY Left 10/14/2017   calcs bx, fibrosis giant cell reaction and chronic inflammation, negative for malignancy.    CARPOMETACARPAL ([facility]) FUSION OF THUMB Right 07/10/2022   Procedure: CARPOMETACARPAL ([place]) SUSPENSION OF RIGHT THUMB;  Surgeon: Corky Mull, MD;  Location: [facility] ORS;  Service: Orthopedics;  Laterality: Right;   CATARACT EXTRACTION, BILATERAL     CESAREAN SECTION  1984   CHOLECYSTECTOMY  1985   COLONOSCOPY WITH PROPOFOL N/A 09/13/2016   Procedure: COLONOSCOPY WITH PROPOFOL;  Surgeon: Manya Silvas, MD;  Location[facility] ENDOSCOPY;  Service: Endoscopy;  Laterality: N/A;   COLONOSCOPY WITH PROPOFOL N/A 11/09/2018   Procedure: COLONOSCOPY WITH PROPOFOL;  Surgeon: Manya Silvas, MD;  Location: [facility] ENDOSCOPY;  Service: Endoscopy;  Laterality: N/A;   COLONOSCOPY WITH PROPOFOL N/A 03/29/2020   Procedure: COLONOSCOPY WITH PROPOFOL;  Surgeon: Robert Bellow, MD;  Location: [facility] ENDOSCOPY;  Service: Endoscopy;  Laterality: N/A;   CORONARY ANGIOPLASTY WITH STENT PLACEMENT N/A 07/24/2006   75% pRCA; 3.5 x 28 mm Cypher DES placed; Location: [place]; Surgeons: Katrine Coho, MD   ESOPHAGOGASTRODUODENOSCOPY (EGD) WITH PROPOFOL N/A 02/02/2018   Procedure: ESOPHAGOGASTRODUODENOSCOPY (EGD) WITH PROPOFOL;  Surgeon: Manya Silvas, MD;  Location: [facility] ENDOSCOPY;  Service: Endoscopy;  Laterality: N/A;   ESOPHAGOGASTRODUODENOSCOPY (EGD) WITH PROPOFOL N/A 03/29/2020   Procedure: ESOPHAGOGASTRODUODENOSCOPY (EGD) WITH PROPOFOL;  Surgeon: Robert Bellow, MD;  Location: [facility] ENDOSCOPY;  Service: Endoscopy;  Laterality: N/A;   EYE SURGERY     JOINT REPLACEMENT      bilateral knee replacements   KNEE ARTHROSCOPY  Arthroscopic left knee surgery    KNEE SURGERY  status post knee surgey    LEFT HEART CATH AND CORONARY ANGIOGRAPHY Left 05/14/2018   Procedure: LEFT HEART CATH AND CORONARY ANGIOGRAPHY;  Surgeon: Corey Skains, MD;  Location: [facility];  Service: Cardiovascular;  Laterality: Left;   REPLACEMENT TOTAL KNEE  ([facility])   SHOULDER SURGERY  shoulder operation secondary to a torn tendon   TOTAL HIP ARTHROPLASTY Left 06/12/2021   Procedure: TOTAL HIP ARTHROPLASTY;  Surgeon: Corky Mull, MD;  Location: [facility] ORS;  Service: Orthopedics;  Laterality: Left;    Family Psychiatric History: Reviewed family psychiatric history from progress note on 06/26/2020.  Family History:  Family History  Problem Relation Age of Onset   Other Mother        Hit by a fire truck and has had multiple operations on her back , and  has history of MVP    Mitral valve prolapse Mother    Lung cancer Mother    Depression Mother    Heart disease Father        myocardial infarction and is status post bypass surgery   Mitral valve prolapse Sister    Bipolar disorder Sister    Hepatitis C Brother    Cirrhosis Brother    Colon cancer Paternal Aunt    Breast cancer Neg Hx    Prostate cancer Neg Hx    Bladder Cancer Neg Hx    Kidney cancer Neg Hx     Social History: Reviewed social history from progress note on 02 May 2020. Social History   Socioeconomic History   Marital status: Widowed    Spouse name: Shanquita Ronning   Number of children: 1   Years of education: 12   Highest education level: 12th grade  Occupational History    Employer: [employer]  Tobacco Use   Smoking status: Every Day    Packs/day: 2.00    Years: 45.00    Total pack years: 90.00    Types: Cigarettes    Passive exposure: Current   Smokeless tobacco: Never   Tobacco comments:    Patient reported she is not ready to quit at this time due to recent loss of her husband.   Vaping Use    Vaping Use: Some days   Substances: Flavoring  Substance and Sexual Activity   Alcohol use: No    Alcohol/week: 0.0 standard drinks of alcohol   Drug use: No   Sexual activity: Not Currently  Other Topics Concern   Not on file  Social History Narrative   Not on file   Social Determinants of Health   Financial Resource Strain: Medium Risk (01/11/2022)   Overall Financial Resource Strain (CARDIA)    Difficulty of Paying Living Expenses: Somewhat hard  Food Insecurity: No Food Insecurity (05/03/2022)   Hunger Vital Sign    Worried About Running Out of Food in the Last Year: Never true    Ran Out of Food in the Last Year: Never true  Transportation Needs: No Transportation Needs (05/03/2022)   PRAPARE - Hydrologist (Medical): No    Lack of Transportation (Non-Medical): No  Physical Activity: Unknown (05/03/2022)   Exercise Vital Sign    Days of Exercise per Week: 0 days    Minutes of Exercise per Session: Not on file  Recent Concern: Physical Activity - Inactive (05/03/2022)   Exercise Vital Sign    Days of Exercise per Week: 0 days    Minutes of Exercise per Session: 0 min  Stress: No Stress Concern Present (05/03/2022)   [place]    Feeling of Stress : Not at all  Social Connections: Moderately Isolated (05/03/2022)   Social Connection and Isolation Panel [NHANES]    Frequency of Communication with Friends and Family: More than three times a week    Frequency of Social Gatherings with Friends and Family: More than three times a week    Attends Religious Services: 1 to 4 times per year    Active Member of [employer] or Organizations: No    Attends Archivist Meetings: Never    Marital Status: Widowed    Allergies:  Allergies  Allergen Reactions   Varenicline Other (See Comments)    "I got really depressed" "I got really depressed" CHANTEX   Varenicline Tartrate  Jardiance  [Empagliflozin] Other (See Comments)    Yeast infection   Metformin And Related Other (See Comments)    Diarrhea, even with XR   Methylprednisolone Nausea Only and Nausea And Vomiting    Metabolic Disorder Labs: Lab Results  Component Value Date   HGBA1C 7.0 (H) 06/17/2022   No results found for: "PROLACTIN" Lab Results  Component Value Date   CHOL 114 06/17/2022   TRIG 94.0 06/17/2022   HDL 49.70 06/17/2022   CHOLHDL 2 06/17/2022   VLDL 18.8 06/17/2022   LDLCALC 46 06/17/2022   LDLCALC 62 03/05/2022   Lab Results  Component Value Date   TSH 1.23 06/17/2022   TSH 0.87 07/06/2021    Therapeutic Level Labs: No results found for: "LITHIUM" No results found for: "VALPROATE" No results found for: "CBMZ"  Current Medications: Current Outpatient Medications  Medication Sig Dispense Refill   Accu-Chek Softclix Lancets lancets Use as instructed 100 each 12   acetaminophen (TYLENOL) 325 MG tablet Take 650 mg by mouth every 6 (six) hours as needed.     albuterol (VENTOLIN HFA) 108 (90 Base) MCG/ACT inhaler INHALE 2 PUFFS FOUR TIMES A DAY 8.5 g 11   amLODipine (NORVASC) 10 MG tablet TAKE 1 TABLET EVERY DAY 90 tablet 0   Blood Glucose Monitoring Suppl (TRUE METRIX METER) w/Device KIT      Budeson-Glycopyrrol-Formoterol (BREZTRI AEROSPHERE) 160-9-4.8 MCG/ACT AERO Inhale 2 puffs into the lungs in the morning and at bedtime. 32.1 g 3   CALCIUM PO Take 600 mg by mouth daily.      cholecalciferol (VITAMIN D3) 25 MCG (1000 UNIT) tablet Take 1,000 Units by mouth daily.     Cyanocobalamin (VITAMIN B-12 PO) Take 2,500 mcg by mouth daily.     DULoxetine (CYMBALTA) 60 MG capsule TAKE 1 CAPSULE EVERY DAY 90 capsule 1   ELIQUIS 5 MG TABS tablet TAKE 1 TABLET TWICE DAILY 180 tablet 0   famotidine (PEPCID) 40 MG tablet TAKE 1 TABLET EVERY DAY 90 tablet 1   HM MELATONIN PO 5 mg by Other route at bedtime as needed. patch     isosorbide mononitrate (IMDUR) 30 MG 24 hr tablet TAKE 1 TABLET EVERY  DAY 90 tablet 0   Multiple Vitamin (MULTIVITAMIN) tablet Take 1 tablet by mouth daily.     mupirocin ointment (BACTROBAN) 2 % Apply 1 application. topically 2 (two) times daily. 22 g 0   nystatin (MYCOSTATIN/NYSTOP) powder APPLY TO THE AFFECTED AREA(S) TWICE DAILY 30 g 0   pantoprazole (PROTONIX) 40 MG tablet TAKE 1 TABLET TWICE DAILY BEFORE MEALS 180 tablet 1   propranolol ER (INDERAL LA) 80 MG 24 hr capsule Take 1 capsule (80 mg total) by mouth 2 (two) times daily. 180 capsule 1   rOPINIRole (REQUIP) 0.5 MG tablet Take 1 tablet (0.5 mg total) by mouth daily after lunch. 90 tablet 0   rOPINIRole (REQUIP) 3 MG tablet Take 1 tablet (3 mg total) by mouth at bedtime. Take along with 0.5 mg daily at lunch 30 tablet 1   rosuvastatin (CRESTOR) 20 MG tablet TAKE 1 TABLET EVERY DAY 90 tablet 0   Semaglutide,0.25 or 0.5MG/DOS, (OZEMPIC, 0.25 OR 0.5 MG/DOSE,) 2 MG/1.5ML SOPN Inject 0.25 mg weekly for 4 weeks then increase to 0.5 mg weekly (Patient taking differently: Inject 0.5 mg into the skin once a week. Inject 0.25 mg weekly for 4 weeks then increase to 0.5 mg weekly) 1.5 mL 2   telmisartan (MICARDIS) 80 MG tablet TAKE 1 TABLET EVERY  DAY 90 tablet 1   tiZANidine (ZANAFLEX) 2 MG tablet Take 2 mg by mouth 3 (three) times daily as needed.     traMADol (ULTRAM) 50 MG tablet Take by mouth.     traZODone (DESYREL) 100 MG tablet Take 1 tablet (100 mg total) by mouth at bedtime. Stop seroquel 30 tablet 0   traZODone (DESYREL) 100 MG tablet Take 1 tablet (100 mg total) by mouth at bedtime. Stop seroquel 90 tablet 0   trimethoprim (TRIMPEX) 100 MG tablet Take 1 tablet (100 mg total) by mouth daily. 90 tablet 3   TRUE METRIX BLOOD GLUCOSE TEST test strip TEST BLOOD SUGAR TWICE DAILY AS DIRECTED 200 strip 1   Vibegron (GEMTESA) 75 MG TABS Take 75 mg by mouth daily. 90 tablet 3   metFORMIN (GLUCOPHAGE) 500 MG tablet Take by mouth. (Patient not taking: Reported on 08/30/2022)     methylPREDNISolone (MEDROL DOSEPAK) 4  MG TBPK tablet Take by mouth. (Patient not taking: Reported on 08/30/2022)     nicotine polacrilex (NICORETTE) 4 MG gum Chew gum a few times then place between cheek and gum. Use every 30 minutes as needed for smoking urges (Patient not taking: Reported on 08/30/2022)     rOPINIRole (REQUIP) 0.5 MG tablet Take 1 tablet (0.5 mg total) by mouth daily after lunch. (Patient not taking: Reported on 08/30/2022) 30 tablet 0   No current facility-administered medications for this visit.     Musculoskeletal: Strength & Muscle Tone: within normal limits Gait & Station: normal Patient leans: N/A  Psychiatric Specialty Exam: Review of Systems  Musculoskeletal:  Positive for neck pain.  Psychiatric/Behavioral: Negative.    All other systems reviewed and are negative.   There were no vitals taken for this visit.There is no height or weight on file to calculate BMI.  General Appearance: Casual  Eye Contact:  Fair  Speech:  Clear and Coherent  Volume:  Normal  Mood:  Euthymic  Affect:  Congruent  Thought Process:  Goal Directed and Descriptions of Associations: Intact  Orientation:  Full (Time, Place, and Person)  Thought Content: Logical   Suicidal Thoughts:  No  Homicidal Thoughts:  No  Memory:  Immediate;   Fair Recent;   Fair Remote;   Fair  Judgement:  Fair  Insight:  Fair  Psychomotor Activity:  Normal  Concentration:  Concentration: Fair and Attention Span: Fair  Recall:  [employer] of Knowledge: Fair  Language: Fair  Akathisia:  No  Handed:  Right  AIMS (if indicated): not done  Assets:  Communication Skills Desire for Improvement Housing Social Support  ADL's:  Intact  Cognition: WNL  Sleep:  Fair   Screenings: [place] Office Visit from 05/14/2022 in [place] [employer] Video Visit from 04/03/2022 in [place] Total Score 0 0      GAD-7    Flowsheet Row Video Visit from 06/25/2021 in [facility] Video Visit from 05/03/2021 in [facility] Video Visit from 04/05/2021 in [facility]  Total GAD-7 Score '6 3 9      ' PHQ2-9    Flowsheet Row Video Visit from 08/30/2022 in [facility] Video Visit from 08/06/2022 in [place] Video Visit from 06/24/2022 in [facility] Office Visit from 05/23/2022 in [facility] Office Visit from 05/14/2022 in [place]  PHQ-2 Total Score 0 0 6 1  1  PHQ-9 Total Score -- -- 24 -- 4      Flowsheet Row Video Visit from 08/30/2022 in [place] Video Visit from 08/06/2022 in [place] Admission (Discharged) from 07/10/2022 in [place] RISK CATEGORY Low Risk Low Risk Error: Question 6 not populated        Is  Assessment and Plan: PAMLEA FINDER is a 66 year old Caucasian female who has a history of MDD, anxiety disorder, bereavement, sleep problems, presented for medication management.  Patient is currently improving.  Plan as noted below.  Plan MDD in full remission Cymbalta 60 mg p.o. daily   Other specified anxiety disorder-stable Cymbalta 60 mg p.o. daily  Insomnia-improving Continue CPAP for OSA Requip 3 mg at bedtime and 0.5 mg in the afternoon  RLS-improving Requip as prescribed.  Bereavement-improving Patient is currently improving.  Will monitor closely   Tobacco use disorder-improving Patient is currently following up with [facility] smoking cessation classes. Discussed Wellbutrin, drug to drug interaction with her other medications, and is not interested in trial of that at this time.   Follow-up in clinic in 3 months or sooner if needed.    Consent: Patient/Guardian gives verbal consent for treatment and assignment of  benefits for services provided during this visit. Patient/Guardian expressed understanding and agreed to proceed.   This note was generated in part or whole with voice recognition software. Voice recognition is usually quite accurate but there are transcription errors that can and very often do occur. I apologize for any typographical errors that were not detected and corrected.      Ursula Alert, MD 08/30/2022, 12:53 PM

## 2022-09-01 ENCOUNTER — Other Ambulatory Visit: Payer: Self-pay | Admitting: Internal Medicine

## 2022-09-01 ENCOUNTER — Encounter: Payer: Self-pay | Admitting: Internal Medicine

## 2022-09-01 DIAGNOSIS — M79644 Pain in right finger(s): Secondary | ICD-10-CM | POA: Insufficient documentation

## 2022-09-01 DIAGNOSIS — M542 Cervicalgia: Secondary | ICD-10-CM | POA: Insufficient documentation

## 2022-09-03 ENCOUNTER — Encounter: Payer: Self-pay | Admitting: Internal Medicine

## 2022-09-03 ENCOUNTER — Ambulatory Visit: Payer: Medicare HMO | Admitting: Occupational Therapy

## 2022-09-04 ENCOUNTER — Telehealth: Payer: Self-pay | Admitting: Internal Medicine

## 2022-09-04 NOTE — Telephone Encounter (Signed)
Message sent - request for pt assistance.

## 2022-09-04 NOTE — Telephone Encounter (Signed)
Ms Kathryn Friedman and stated she needs pt assistance with her meds - specifically eliquis, gemtesa, ozempic and propranolol.  Please place referral to see if can get her help/assistance.

## 2022-09-05 ENCOUNTER — Telehealth: Payer: Self-pay | Admitting: Pharmacist

## 2022-09-05 ENCOUNTER — Other Ambulatory Visit: Payer: Self-pay

## 2022-09-05 DIAGNOSIS — N3281 Overactive bladder: Secondary | ICD-10-CM

## 2022-09-05 DIAGNOSIS — I872 Venous insufficiency (chronic) (peripheral): Secondary | ICD-10-CM

## 2022-09-05 DIAGNOSIS — I2510 Atherosclerotic heart disease of native coronary artery without angina pectoris: Secondary | ICD-10-CM

## 2022-09-05 DIAGNOSIS — I10 Essential (primary) hypertension: Secondary | ICD-10-CM

## 2022-09-05 DIAGNOSIS — N3941 Urge incontinence: Secondary | ICD-10-CM

## 2022-09-05 DIAGNOSIS — E1159 Type 2 diabetes mellitus with other circulatory complications: Secondary | ICD-10-CM

## 2022-09-05 DIAGNOSIS — I471 Supraventricular tachycardia, unspecified: Secondary | ICD-10-CM

## 2022-09-05 DIAGNOSIS — I700 Atherosclerosis of aorta: Secondary | ICD-10-CM

## 2022-09-05 DIAGNOSIS — I779 Disorder of arteries and arterioles, unspecified: Secondary | ICD-10-CM

## 2022-09-05 NOTE — Progress Notes (Signed)
[facility]) Care Management  [place]   09/05/2022  Kathryn Friedman Apr 12, 1956 [id number]  Reason for referral: Medication Assistance  Referral source: Provider's Office Referral medication(s): Eliquis, Ozempic, Gemtesa, Propranolol X: Current insurance:[employer] HMO   Objective: Allergies  Allergen Reactions   Varenicline Other (See Comments)    "I got really depressed" "I got really depressed" CHANTEX   Varenicline Tartrate    Jardiance [Empagliflozin] Other (See Comments)    Yeast infection   Metformin And Related Other (See Comments)    Diarrhea, even with XR   Methylprednisolone Nausea Only and Nausea And Vomiting     Medication Assistance Findings:  Medication assistance needs identified: Spoke with the patient over the phone.   HIPAA identifiers were obtained.  She reported needing help with Gemtesa, Ozempic, and Eliquis.  [facility] does not have a patient assistance program.  [employer] provides assistance with Gemtesa for Parkinson's Disease and Rhett Syndrome but not for overactive bladder.  Patient appears to qualify for Ozempic but has not met the TROOP for Eliquis.  She shared that she currently has some level of LIS but will lose that at the beginning of the year due to the fact that she now gets her deceased husband's social security check.  She also shared that she received Ozempic and Breztri through patient assistance programs last year.  She will be sent applications for Ozempic and Breztri for 2024.  Patient was instructed to keep a watch on her EOBs from [facility] for 5056 so an application can be completed for Eliquis. As mentioned earlier, [facility] does not have a program.   Additional medication assistance options reviewed with patient as warranted:  No other options identified  Plan: I will route patient assistance letter to [place] technician who will coordinate patient assistance program application process for medications  listed above.  [facility] pharmacy technician will assist with obtaining all required documents from both patient and provider(s) and submit application(s) once completed.

## 2022-09-05 NOTE — Telephone Encounter (Signed)
Referral has been placed. 

## 2022-09-09 ENCOUNTER — Other Ambulatory Visit: Payer: Self-pay | Admitting: Internal Medicine

## 2022-09-10 ENCOUNTER — Encounter: Payer: Self-pay | Admitting: Internal Medicine

## 2022-09-11 DIAGNOSIS — F17210 Nicotine dependence, cigarettes, uncomplicated: Secondary | ICD-10-CM | POA: Diagnosis not present

## 2022-09-11 DIAGNOSIS — I872 Venous insufficiency (chronic) (peripheral): Secondary | ICD-10-CM | POA: Diagnosis not present

## 2022-09-11 DIAGNOSIS — I25119 Atherosclerotic heart disease of native coronary artery with unspecified angina pectoris: Secondary | ICD-10-CM | POA: Diagnosis not present

## 2022-09-11 DIAGNOSIS — E119 Type 2 diabetes mellitus without complications: Secondary | ICD-10-CM | POA: Diagnosis not present

## 2022-09-11 DIAGNOSIS — I10 Essential (primary) hypertension: Secondary | ICD-10-CM | POA: Diagnosis not present

## 2022-09-11 DIAGNOSIS — Z716 Tobacco abuse counseling: Secondary | ICD-10-CM | POA: Diagnosis not present

## 2022-09-11 DIAGNOSIS — J449 Chronic obstructive pulmonary disease, unspecified: Secondary | ICD-10-CM | POA: Diagnosis not present

## 2022-09-12 NOTE — Telephone Encounter (Signed)
Please call and notify her where she can dispose of her needles.  I am not sure.

## 2022-09-13 ENCOUNTER — Telehealth: Payer: Self-pay

## 2022-09-13 DIAGNOSIS — I10 Essential (primary) hypertension: Secondary | ICD-10-CM | POA: Diagnosis not present

## 2022-09-13 DIAGNOSIS — J449 Chronic obstructive pulmonary disease, unspecified: Secondary | ICD-10-CM | POA: Diagnosis not present

## 2022-09-13 DIAGNOSIS — E119 Type 2 diabetes mellitus without complications: Secondary | ICD-10-CM | POA: Diagnosis not present

## 2022-09-13 NOTE — Telephone Encounter (Signed)
Pt called and notified that Ozempic was recived from [facility] for patient to pick up. 5 boxes received & placed in fridge.

## 2022-09-17 ENCOUNTER — Encounter: Payer: Self-pay | Admitting: Internal Medicine

## 2022-09-18 ENCOUNTER — Telehealth: Payer: Self-pay | Admitting: Pharmacy Technician

## 2022-09-18 ENCOUNTER — Other Ambulatory Visit: Payer: Self-pay | Admitting: *Deleted

## 2022-09-18 DIAGNOSIS — Z596 Low income: Secondary | ICD-10-CM

## 2022-09-18 MED ORDER — TRUE METRIX METER W/DEVICE KIT: PACK | 0 refills | Status: DC

## 2022-09-18 NOTE — Progress Notes (Signed)
[facility])                                            [place] Team    09/18/2022  OHANNA GASSERT 1956-02-03 [id number]                                      Medication Assistance Referral-FOR 2024 RE ENROLLMENT  Referral From: [place]  Medication/Company: Larna Daughters / [employer] Patient application portion:  Education officer, museum portion: Faxed  to Dr. Einar Pheasant Provider address/fax verified via: Office website  Medication/Company: Judithann Sauger / [employer] Patient application portion:  Mailed Provider application portion: Faxed  to Dr. Einar Pheasant Provider address/fax verified via: Office website  Carlyle Achenbach P. Raydon Chappuis, [place]  [phone]

## 2022-09-18 NOTE — Telephone Encounter (Signed)
Please call and confirm she is doing ok.  States having a stomach virus.  Confirm able to eat.  No vomiting.  No abdominal pain?  Also, blood pressures are a little higher in am.  May be related to above.  Continue to monitor.  Send in readings and let me know if any problems.

## 2022-09-20 ENCOUNTER — Telehealth: Payer: Self-pay

## 2022-09-20 NOTE — Telephone Encounter (Signed)
Patient states she is returning call from Gracy Racer, [place].  I read Dr. Randell Patient Scott's message to patient.  Patient states she is able to eat now.  Patient states she never did vomit, she felt like she needed to but she never did.  Patient states she did have diarrhea real bad.  Patient states she is taking imodium and her diarrhea has not completely stopped, but it is much better.  Patient states she took two imodum pills this morning.  Patient states she was having abdominal pain but she is not now.  Patient states she will continue to send Dr. Nicki Reaper her blood pressure and blood sugar readings.

## 2022-09-20 NOTE — Telephone Encounter (Signed)
LMOM for pt to CB in regards to [facility] and to see how she is per Dr. Nicki Reaper:    Will also be sending pt a mychart msg.

## 2022-09-24 ENCOUNTER — Encounter: Payer: Self-pay | Admitting: Internal Medicine

## 2022-09-25 ENCOUNTER — Ambulatory Visit: Payer: Medicare HMO | Admitting: Physical Therapy

## 2022-09-25 DIAGNOSIS — F17200 Nicotine dependence, unspecified, uncomplicated: Secondary | ICD-10-CM | POA: Diagnosis not present

## 2022-09-25 NOTE — Telephone Encounter (Signed)
Pt advised - will send in readings periodically unless she notices significant change. Has physical sched 11/17

## 2022-09-25 NOTE — Telephone Encounter (Signed)
Please call her and let her know that overall her sugars are ok.  Still some variation, but overall ok.  Blood pressure mostly in 130s/70s.  Occasional increase.  Will continue to follow.  If she is getting tired of sending readings in, she can contact me periodically just to let me know things are stable.  Tell her to tell [place] hello.

## 2022-09-30 ENCOUNTER — Ambulatory Visit: Payer: Medicare HMO | Admitting: Urology

## 2022-09-30 ENCOUNTER — Encounter: Payer: Self-pay | Admitting: Urology

## 2022-09-30 ENCOUNTER — Telehealth: Payer: Self-pay | Admitting: Cardiovascular Disease

## 2022-09-30 VITALS — BP 127/71 | HR 83 | Ht 64.0 in | Wt 204.0 lb

## 2022-09-30 DIAGNOSIS — R350 Frequency of micturition: Secondary | ICD-10-CM | POA: Diagnosis not present

## 2022-09-30 DIAGNOSIS — N3281 Overactive bladder: Secondary | ICD-10-CM

## 2022-09-30 LAB — URINALYSIS, COMPLETE
Bilirubin, UA: NEGATIVE
Glucose, UA: NEGATIVE
Ketones, UA: NEGATIVE
Leukocytes,UA: NEGATIVE
Nitrite, UA: NEGATIVE
Protein,UA: NEGATIVE
RBC, UA: NEGATIVE
Specific Gravity, UA: <1.005 — ABNORMAL LOW (ref 1.005–1.030)
Urobilinogen, Ur: 0.2 mg/dL (ref 0.2–1.0)
pH, UA: 5 (ref 5.0–7.5)

## 2022-09-30 LAB — MICROSCOPIC EXAMINATION: Bacteria, UA: NONE SEEN
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
## 2022-09-30 NOTE — Addendum Note (Signed)
Addended by: Kyra Manges on: 09/30/2022 11:09 AM   Modules accepted: Orders

## 2022-09-30 NOTE — Telephone Encounter (Signed)
   Pre-operative Risk Assessment    Patient Name: Kathryn Friedman  DOB: 10/20/1956 MRN: [id number]      Request for Surgical Clearance    Procedure:   BOTOX BLADDER TREATMENT  Date of Surgery:  Clearance  Surgeon:  NOT INDICATED Surgeon's Group or Practice Name:  [facility] Phone number:  [phone] Fax number:  [phone]  Type of Clearance Requested:   - Pharmacy:  Hold Apixaban (Eliquis) 5 DAYS PRIOR  Type of Anesthesia:  Not Indicated   Additional requests/questions:    Signed, Eli Phillips   09/30/2022, 11:35 AM

## 2022-10-01 ENCOUNTER — Encounter: Payer: Medicare HMO | Admitting: Physical Therapy

## 2022-10-02 ENCOUNTER — Telehealth: Payer: Self-pay

## 2022-10-02 ENCOUNTER — Institutional Professional Consult (permissible substitution): Payer: Medicare HMO | Admitting: Internal Medicine

## 2022-10-02 NOTE — Telephone Encounter (Signed)
   Name: Kathryn Friedman  DOB: [birth year]  MRN: [id number]  Primary Cardiologist: None   Preoperative team, please contact this patient and set up a phone call appointment for further preoperative risk assessment. Please obtain consent and complete medication review. Thank you for your help.  I confirm that guidance regarding antiplatelet and oral anticoagulation therapy has been completed and, if necessary, noted below.  Per office protocol, patient  hold Eliquis for 3 days prior to procedure. Given the elimination half life of 5 mg dose ~15 hrs 5 days hold would put patient at the risk.   Patient will not need bridging with Lovenox (enoxaparin) around procedure.   Deberah Pelton, NP 10/02/2022, 11:33 AM [place]

## 2022-10-02 NOTE — Telephone Encounter (Signed)
Patient with diagnosis of atrial fibriliation on Eliquis for anticoagulation.    Procedure: Botox Bladder treatment  Date of procedure: TBD   CHA2DS2-VASc Score = 5   This indicates a 7.2% annual risk of stroke. The patient's score is based upon: CHF History: 0 HTN History: 1 Diabetes History: 1 Stroke History: 0 Vascular Disease History: 1 Age Score: 1 Gender Score: 1    CrCl 79 mL/min (Adj Wt)  (based on SrCr 0.77 on 06/17/2022)  Platelet count 176 (07/01/2022)    Per office protocol, patient  hold Eliquis for 3 days prior to procedure. Given the elimination half life of 5 mg dose ~15 hrs 5 days hold would put patient at the risk.   Patient will not need bridging with Lovenox (enoxaparin) around procedure.  **This guidance is not considered finalized until pre-operative APP has relayed final recommendations.**

## 2022-10-02 NOTE — Telephone Encounter (Signed)
Kathryn Friedman [id number] [birth year]  Provider & GGE:ZMOQH MacDiarmod [id number]  Procedure [id number] auth required Drug GYFV:C9449  OAB:100 units   Insurance:[employer] Policy#:[id number] QPRFFMBWG:[id number] Spoke VX:[id number] H   Key: [id number] - PA Case ID: [id number] Need help? Call us at [phone] Status Sent to [employer] Botox 100UNIT solution Form Nurse, adult and Medical Benefit PA Form  (Key: [id number])  Your information has been submitted to [facility]. [employer] will review the request and will issue a decision, typically within 3-7 days from your submission. You can check the updated outcome later by reopening this request.  If [employer] has not responded in 3-7 days or if you have any questions about your ePA request, please contact [employer] at [phone]. If you think there may be a problem with your PA request, use our live chat feature at the bottom right.  For [place] requests, please call [phone].  Reference#:[id number] Notes:   Person collecting information name:S.Dailah Opperman

## 2022-10-03 ENCOUNTER — Encounter: Payer: Medicare HMO | Admitting: Physical Therapy

## 2022-10-03 ENCOUNTER — Telehealth: Payer: Self-pay | Admitting: Internal Medicine

## 2022-10-03 ENCOUNTER — Telehealth: Payer: Self-pay | Admitting: *Deleted

## 2022-10-03 ENCOUNTER — Telehealth: Payer: Medicare HMO | Admitting: Internal Medicine

## 2022-10-03 ENCOUNTER — Other Ambulatory Visit: Payer: Self-pay

## 2022-10-03 DIAGNOSIS — E1159 Type 2 diabetes mellitus with other circulatory complications: Secondary | ICD-10-CM

## 2022-10-03 DIAGNOSIS — D582 Other hemoglobinopathies: Secondary | ICD-10-CM

## 2022-10-03 DIAGNOSIS — I10 Essential (primary) hypertension: Secondary | ICD-10-CM

## 2022-10-03 DIAGNOSIS — E7800 Pure hypercholesterolemia, unspecified: Secondary | ICD-10-CM

## 2022-10-03 LAB — CULTURE, URINE COMPREHENSIVE

## 2022-10-03 NOTE — Telephone Encounter (Signed)
Orders placed.

## 2022-10-03 NOTE — Addendum Note (Signed)
Addended by: Alisa Graff on: 10/03/2022 09:25 PM   Modules accepted: Orders

## 2022-10-03 NOTE — Telephone Encounter (Signed)
Pt agreeable to plan of care for tele pre op appt 10/09/22 '@10'$ :20. Med rec and consent are done.

## 2022-10-03 NOTE — Telephone Encounter (Signed)
Pt agreeable to plan of care for tele pre op appt 10/09/22 @ 10:20. Med rec and consent are done.     Patient Consent for Virtual Visit        Kathryn Friedman has provided verbal consent on 10/03/2022 for a virtual visit (video or telephone).   CONSENT FOR VIRTUAL VISIT FOR:  Kathryn Friedman  By participating in this virtual visit I agree to the following:  I hereby voluntarily request, consent and authorize [facility] and its employed or contracted physicians, physician assistants, nurse practitioners or other licensed health care professionals (the Practitioner), to provide me with telemedicine health care services (the "Services") as deemed necessary by the treating Practitioner. I acknowledge and consent to receive the Services by the Practitioner via telemedicine. I understand that the telemedicine visit will involve communicating with the Practitioner through live audiovisual communication technology and the disclosure of certain medical information by electronic transmission. I acknowledge that I have been given the opportunity to request an in-person assessment or other available alternative prior to the telemedicine visit and am voluntarily participating in the telemedicine visit.  I understand that I have the right to withhold or withdraw my consent to the use of telemedicine in the course of my care at any time, without affecting my right to future care or treatment, and that the Practitioner or I may terminate the telemedicine visit at any time. I understand that I have the right to inspect all information obtained and/or recorded in the course of the telemedicine visit and may receive copies of available information for a reasonable fee.  I understand that some of the potential risks of receiving the Services via telemedicine include:  Delay or interruption in medical evaluation due to technological equipment failure or disruption; Information transmitted may not be  sufficient (e.g. poor resolution of images) to allow for appropriate medical decision making by the Practitioner; and/or  In rare instances, security protocols could fail, causing a breach of personal health information.  Furthermore, I acknowledge that it is my responsibility to provide information about my medical history, conditions and care that is complete and accurate to the best of my ability. I acknowledge that Practitioner's advice, recommendations, and/or decision may be based on factors not within their control, such as incomplete or inaccurate data provided by me or distortions of diagnostic images or specimens that may result from electronic transmissions. I understand that the practice of medicine is not an exact science and that Practitioner makes no warranties or guarantees regarding treatment outcomes. I acknowledge that a copy of this consent can be made available to me via my patient portal ([facility]), or I can request a printed copy by calling the office of [facility].    I understand that my insurance will be billed for this visit.   I have read or had this consent read to me. I understand the contents of this consent, which adequately explains the benefits and risks of the Services being provided via telemedicine.  I have been provided ample opportunity to ask questions regarding this consent and the Services and have had my questions answered to my satisfaction. I give my informed consent for the services to be provided through the use of telemedicine in my medical care

## 2022-10-03 NOTE — Telephone Encounter (Signed)
Patient has a lab appt 10/09/2022, there are no orders in.

## 2022-10-04 ENCOUNTER — Ambulatory Visit (INDEPENDENT_AMBULATORY_CARE_PROVIDER_SITE_OTHER): Payer: Medicare HMO | Admitting: Primary Care

## 2022-10-04 ENCOUNTER — Encounter: Payer: Self-pay | Admitting: Primary Care

## 2022-10-04 ENCOUNTER — Telehealth: Payer: Self-pay

## 2022-10-04 VITALS — BP 128/80 | HR 80 | Temp 98.1°F | Ht 64.0 in | Wt 206.6 lb

## 2022-10-04 DIAGNOSIS — G4733 Obstructive sleep apnea (adult) (pediatric): Secondary | ICD-10-CM | POA: Diagnosis not present

## 2022-10-04 NOTE — Telephone Encounter (Signed)
Spoke to patient and relayed below message. She voiced her understanding.  Nothing further needed.

## 2022-10-04 NOTE — Telephone Encounter (Signed)
Ok, can we put an order in for new CPAP machine with current pressure setting 5-20cm h20  Thanks

## 2022-10-04 NOTE — Telephone Encounter (Signed)
Lm for Brad with [facility] to confirm cpap settings and to ask if they have a sleep study on file.

## 2022-10-04 NOTE — Patient Instructions (Addendum)
Download from CPAP machine showed that your machine is working well. You are not having any airleaks. You are having no residual apneas on current pressure settings. I do not think your sleep apnea is causing sleep disruption. I would speak with your psychiatrist about adjusting medications for depression/insomnia. May want to go up on trazodone dose.   Orders: New CPAP machine (we need to call [facility] to find out current pressure setting and get copy sleep study)  Follow-up: 6 months with [facility] NP or sooner if needed    Sleep Apnea Sleep apnea affects breathing during sleep. It causes breathing to stop for 10 seconds or more, or to become shallow. People with sleep apnea usually snore loudly. It can also increase the risk of: Heart attack. Stroke. Being very overweight (obese). Diabetes. Heart failure. Irregular heartbeat. High blood pressure. The goal of treatment is to help you breathe normally again. What are the causes?  The most common cause of this condition is a collapsed or blocked airway. There are three kinds of sleep apnea: Obstructive sleep apnea. This is caused by a blocked or collapsed airway. Central sleep apnea. This happens when the brain does not send the right signals to the muscles that control breathing. Mixed sleep apnea. This is a combination of obstructive and central sleep apnea. What increases the risk? Being overweight. Smoking. Having a small airway. Being older. Being female. Drinking alcohol. Taking medicines to calm yourself (sedatives or tranquilizers). Having family members with the condition. Having a tongue or tonsils that are larger than normal. What are the signs or symptoms? Trouble staying asleep. Loud snoring. Headaches in the morning. Waking up gasping. Dry mouth or sore throat in the morning. Being sleepy or tired during the day. If you are sleepy or tired during the day, you may also: Not be able to focus your mind  (concentrate). Forget things. Get angry a lot and have mood swings. Feel sad (depressed). Have changes in your personality. Have less interest in sex, if you are female. Be unable to have an erection, if you are female. How is this treated?  Sleeping on your side. Using a medicine to get rid of mucus in your nose (decongestant). Avoiding the use of alcohol, medicines to help you relax, or certain pain medicines (narcotics). Losing weight, if needed. Changing your diet. Quitting smoking. Using a machine to open your airway while you sleep, such as: An oral appliance. This is a mouthpiece that shifts your lower jaw forward. A CPAP device. This device blows air through a mask when you breathe out (exhale). An EPAP device. This has valves that you put in each nostril. A BIPAP device. This device blows air through a mask when you breathe in (inhale) and breathe out. Having surgery if other treatments do not work. Follow these instructions at home: Lifestyle Make changes that your doctor recommends. Eat a healthy diet. Lose weight if needed. Avoid alcohol, medicines to help you relax, and some pain medicines. Do not smoke or use any products that contain nicotine or tobacco. If you need help quitting, ask your doctor. General instructions Take over-the-counter and prescription medicines only as told by your doctor. If you were given a machine to use while you sleep, use it only as told by your doctor. If you are having surgery, make sure to tell your doctor you have sleep apnea. You may need to bring your device with you. Keep all follow-up visits. Contact a doctor if: The machine that you were  given to use during sleep bothers you or does not seem to be working. You do not get better. You get worse. Get help right away if: Your chest hurts. You have trouble breathing in enough air. You have an uncomfortable feeling in your back, arms, or stomach. You have trouble talking. One side  of your body feels weak. A part of your face is hanging down. These symptoms may be an emergency. Get help right away. Call your local emergency services (911 in the [place] Do not wait to see if the symptoms will go away. Do not drive yourself to the hospital. Summary This condition affects breathing during sleep. The most common cause is a collapsed or blocked airway. The goal of treatment is to help you breathe normally while you sleep. This information is not intended to replace advice given to you by your health care provider. Make sure you discuss any questions you have with your health care provider. Document Revised: 06/20/2021 Document Reviewed: 10/20/2020 Elsevier Patient Education  [place].  Insomnia Insomnia is a sleep disorder that makes it difficult to fall asleep or stay asleep. Insomnia can cause fatigue, low energy, difficulty concentrating, mood swings, and poor performance at work or school. There are three different ways to classify insomnia: Difficulty falling asleep. Difficulty staying asleep. Waking up too early in the morning. Any type of insomnia can be long-term (chronic) or short-term (acute). Both are common. Short-term insomnia usually lasts for 3 months or less. Chronic insomnia occurs at least three times a week for longer than 3 months. What are the causes? Insomnia may be caused by another condition, situation, or substance, such as: Having certain mental health conditions, such as anxiety and depression. Using caffeine, alcohol, tobacco, or drugs. Having gastrointestinal conditions, such as gastroesophageal reflux disease (GERD). Having certain medical conditions. These include: Asthma. Alzheimer's disease. Stroke. Chronic pain. An overactive thyroid gland (hyperthyroidism). Other sleep disorders, such as restless legs syndrome and sleep apnea. Menopause. Sometimes, the cause of insomnia may not be known. What increases the risk? Risk  factors for insomnia include: Gender. Females are affected more often than males. Age. Insomnia is more common as people get older. Stress and certain medical and mental health conditions. Lack of exercise. Having an irregular work schedule. This may include working night shifts and traveling between different time zones. What are the signs or symptoms? If you have insomnia, the main symptom is having trouble falling asleep or having trouble staying asleep. This may lead to other symptoms, such as: Feeling tired or having low energy. Feeling nervous about going to sleep. Not feeling rested in the morning. Having trouble concentrating. Feeling irritable, anxious, or depressed. How is this diagnosed? This condition may be diagnosed based on: Your symptoms and medical history. Your health care provider may ask about: Your sleep habits. Any medical conditions you have. Your mental health. A physical exam. How is this treated? Treatment for insomnia depends on the cause. Treatment may focus on treating an underlying condition that is causing the insomnia. Treatment may also include: Medicines to help you sleep. Counseling or therapy. Lifestyle adjustments to help you sleep better. Follow these instructions at home: Eating and drinking  Limit or avoid alcohol, caffeinated beverages, and products that contain nicotine and tobacco, especially close to bedtime. These can disrupt your sleep. Do not eat a large meal or eat spicy foods right before bedtime. This can lead to digestive discomfort that can make it hard for you to sleep. Sleep  habits  Keep a sleep diary to help you and your health care provider figure out what could be causing your insomnia. Write down: When you sleep. When you wake up during the night. How well you sleep and how rested you feel the next day. Any side effects of medicines you are taking. What you eat and drink. Make your bedroom a dark, comfortable place where  it is easy to fall asleep. Put up shades or blackout curtains to block light from outside. Use a white noise machine to block noise. Keep the temperature cool. Limit screen use before bedtime. This includes: Not watching TV. Not using your smartphone, tablet, or computer. Stick to a routine that includes going to bed and waking up at the same times every day and night. This can help you fall asleep faster. Consider making a quiet activity, such as reading, part of your nighttime routine. Try to avoid taking naps during the day so that you sleep better at night. Get out of bed if you are still awake after 15 minutes of trying to sleep. Keep the lights down, but try reading or doing a quiet activity. When you feel sleepy, go back to bed. General instructions Take over-the-counter and prescription medicines only as told by your health care provider. Exercise regularly as told by your health care provider. However, avoid exercising in the hours right before bedtime. Use relaxation techniques to manage stress. Ask your health care provider to suggest some techniques that may work well for you. These may include: Breathing exercises. Routines to release muscle tension. Visualizing peaceful scenes. Make sure that you drive carefully. Do not drive if you feel very sleepy. Keep all follow-up visits. This is important. Contact a health care provider if: You are tired throughout the day. You have trouble in your daily routine due to sleepiness. You continue to have sleep problems, or your sleep problems get worse. Get help right away if: You have thoughts about hurting yourself or someone else. Get help right away if you feel like you may hurt yourself or others, or have thoughts about taking your own life. Go to your nearest emergency room or: Call 911. Call the [place] at [phone] or 988. This is open 24 hours a day. Text the Crisis Text Line at  225-396-0691. Summary Insomnia is a sleep disorder that makes it difficult to fall asleep or stay asleep. Insomnia can be long-term (chronic) or short-term (acute). Treatment for insomnia depends on the cause. Treatment may focus on treating an underlying condition that is causing the insomnia. Keep a sleep diary to help you and your health care provider figure out what could be causing your insomnia. This information is not intended to replace advice given to you by your health care provider. Make sure you discuss any questions you have with your health care provider. Document Revised: 10/22/2021 Document Reviewed: 10/22/2021 Elsevier Patient Education  [place].

## 2022-10-04 NOTE — Telephone Encounter (Signed)
Thanks

## 2022-10-04 NOTE — Telephone Encounter (Signed)
Spoke to [place] with [employer]. He stated that patient was setup on cpap 10/13/2019. Her current settings are 5-20cm. He will fax over copy of sleep study.  Routing to [place] to make aware.

## 2022-10-04 NOTE — Telephone Encounter (Signed)
Per Leroy Sea with [employer]. Patient is not eligible for new cpap until 09/2024

## 2022-10-04 NOTE — Progress Notes (Unsigned)
_0  ID: Kathryn Friedman, female    DOB: December 17, 1955, 66 y.o.   MRN: [id number]  No chief complaint on file.   Referring provider: Einar Pheasant, MD  HPI: 66 year old female, current smoker. PMH significant for COPD, ILD, OSA, diabetes, HTN, CAD, afib, aortic atherosclerosis.  10/04/2022 Patient presents today for sleep consult.   She has history of severe sleep apnea, diagnosed in 2008. She is currently on CPAP Fatigue symptoms started in April. She is not sleeping well, she never feels rested. Typical bedtime is 11pm and she starts her day at 3am.  She wakes up to use the restroom. She gets on average 5-6 hours of sleep a night and will take a nap some days. Previously managed by Dr. Vella Kohler with [facility]  She takes requip, melatonin patch and trazodone  She drinks 1 cup of coffee a day   She uses full face mask She can not sleep without wearing PAP. CPAP does help   DME company is [place] is 17-103 years old   He main trouble is not getting enough sleep at night. She feels sleepy all the time, however, sometimes she can not fall asleep. She wakes up around 3-4 am in the morning. She feels trazodone is helping some. Her husband passed away ina 2022-12-31. She is going to psychiatrist.   She does not want to repeat sleep testing  Airview download 09/30/21-09/29/22 Usage 253/365 days (96%) > 4 hours Pressure 5-20cm h20 Leaks 95%- 2.8L/min AHI 0.63/hour   Sleep questionnaire Symptoms- Waking up at night  Previous sleep study- 09/02/2019 [facility] sleep report >> AHI 19/HR, mean SpO2 92.9%. BMI 37.  Bedtime- 10-11pm Time to fall asleep- about an hour Nocturnal awakenings- 3-4 times Start of day- 3-4 am  Weight changes- down 20-30 lbs CPAP use- yes, unsure pressure setting  Oxygen use- None  Epworth score- 15 Medications- 116m Trazodone and melatonin 530mpatch    Allergies  Allergen Reactions   Varenicline Other (See Comments)    "I got  really depressed" "I got really depressed" CHANTEX   Varenicline Tartrate    Jardiance [Empagliflozin] Other (See Comments)    Yeast infection   Metformin And Related Other (See Comments)    Diarrhea, even with XR   Methylprednisolone Nausea Only and Nausea And Vomiting    Immunization History  Administered Date(s) Administered   Fluad Quad(high Dose 65+) 10/04/2021   Influenza,inj,Quad PF,6+ Mos 09/23/2013, 09/09/2014, 08/01/2015, 08/06/2016, 08/14/2017, 08/14/2018, 09/16/2019, 07/13/2020   Influenza-Unspecified 10/24/2011   Moderna Sars-Covid-2 Vaccination 01/25/2022   PFIZER(Purple Top)SARS-COV-2 Vaccination 02/23/2020, 02/09/2021   Pneumococcal Polysaccharide-23 09/23/2013   Td 04/21/2018   Zoster Recombinat (Shingrix) 05/06/2022    Past Medical History:  Diagnosis Date   Anemia    Anginal pain (HCC)    Anxiety    Arthritis    back and knees   Asthma    Bilateral carotid artery stenosis    Blood in stool    Chronic diarrhea    COPD (chronic obstructive pulmonary disease) (HCC)    Coronary artery disease    a.) 75% pRCA; 3.5 x 28 mm Cypher DES placed on 07/24/2006   Current use of long term anticoagulation    Clopidogrel   Depression    secondary to the death of her husband (died 1951  Diverticulitis    Diverticulosis    Dizzinesses    Dysphagia    Dyspnea    Dysrhythmia    Fatty infiltration of  liver    GERD (gastroesophageal reflux disease)    Headache    History of 2019 novel coronavirus disease (COVID-19) 12/09/2020   History of 2019 novel coronavirus disease (COVID-19) 12/20/2020   Hypertension    Hypertriglyceridemia    ILD (interstitial lung disease) ([place])    Lump in the abdomen    OSA on CPAP    Overactive bladder    PSVT (paroxysmal supraventricular tachycardia)    Spastic colon    T2DM (type 2 diabetes mellitus) ([place]) 05/2008   Tobacco abuse    Venous insufficiency of both lower extremities     Tobacco History: Social History   Tobacco  Use  Smoking Status Every Day   Packs/day: 2.00   Years: 45.00   Total pack years: 90.00   Types: Cigarettes   Passive exposure: Current  Smokeless Tobacco Never  Tobacco Comments   Patient reported she is not ready to quit at this time due to recent loss of her husband.    Ready to quit: Not Answered Counseling given: Not Answered Tobacco comments: Patient reported she is not ready to quit at this time due to recent loss of her husband.    Outpatient Medications Prior to Visit  Medication Sig Dispense Refill   Accu-Chek Softclix Lancets lancets Use as instructed 100 each 12   acetaminophen (TYLENOL) 325 MG tablet Take 650 mg by mouth every 6 (six) hours as needed.     albuterol (VENTOLIN HFA) 108 (90 Base) MCG/ACT inhaler INHALE 2 PUFFS FOUR TIMES A DAY 8.5 g 11   amLODipine (NORVASC) 10 MG tablet TAKE 1 TABLET EVERY DAY 90 tablet 0   Blood Glucose Monitoring Suppl (TRUE METRIX METER) w/Device KIT Use as directed twice a day to check sugars 1 kit 0   Budeson-Glycopyrrol-Formoterol (BREZTRI AEROSPHERE) 160-9-4.8 MCG/ACT AERO Inhale 2 puffs into the lungs in the morning and at bedtime. 32.1 g 3   CALCIUM PO Take 600 mg by mouth daily.      cholecalciferol (VITAMIN D3) 25 MCG (1000 UNIT) tablet Take 1,000 Units by mouth daily.     Cyanocobalamin (VITAMIN B-12 PO) Take 2,500 mcg by mouth daily.     DULoxetine (CYMBALTA) 60 MG capsule TAKE 1 CAPSULE EVERY DAY 90 capsule 1   ELIQUIS 5 MG TABS tablet TAKE 1 TABLET TWICE DAILY 180 tablet 0   famotidine (PEPCID) 40 MG tablet TAKE 1 TABLET EVERY DAY 90 tablet 1   HM MELATONIN PO 5 mg by Other route at bedtime as needed. patch     isosorbide mononitrate (IMDUR) 30 MG 24 hr tablet TAKE 1 TABLET EVERY DAY 90 tablet 0   methylPREDNISolone (MEDROL DOSEPAK) 4 MG TBPK tablet Take by mouth.     Multiple Vitamin (MULTIVITAMIN) tablet Take 1 tablet by mouth daily.     mupirocin ointment (BACTROBAN) 2 % Apply 1 application. topically 2 (two) times  daily. 22 g 0   nystatin (MYCOSTATIN/NYSTOP) powder APPLY TO THE AFFECTED AREA(S) TWICE DAILY 30 g 0   pantoprazole (PROTONIX) 40 MG tablet TAKE 1 TABLET TWICE DAILY BEFORE MEALS 180 tablet 1   propranolol ER (INDERAL LA) 80 MG 24 hr capsule Take 1 capsule (80 mg total) by mouth 2 (two) times daily. 180 capsule 1   rOPINIRole (REQUIP) 0.5 MG tablet Take 1 tablet (0.5 mg total) by mouth daily after lunch. 30 tablet 0   rOPINIRole (REQUIP) 0.5 MG tablet Take 1 tablet (0.5 mg total) by mouth daily after lunch. 90 tablet 0  rOPINIRole (REQUIP) 3 MG tablet Take 1 tablet (3 mg total) by mouth at bedtime. Take along with 0.5 mg daily at lunch 30 tablet 1   rosuvastatin (CRESTOR) 20 MG tablet TAKE 1 TABLET EVERY DAY 90 tablet 10   Semaglutide,0.25 or 0.5MG/DOS, (OZEMPIC, 0.25 OR 0.5 MG/DOSE,) 2 MG/1.5ML SOPN Inject 0.25 mg weekly for 4 weeks then increase to 0.5 mg weekly (Patient taking differently: Inject 0.5 mg into the skin once a week. Inject 0.25 mg weekly for 4 weeks then increase to 0.5 mg weekly) 1.5 mL 2   telmisartan (MICARDIS) 80 MG tablet TAKE 1 TABLET EVERY DAY 90 tablet 1   tiZANidine (ZANAFLEX) 2 MG tablet Take 2 mg by mouth 3 (three) times daily as needed.     traMADol (ULTRAM) 50 MG tablet Take by mouth.     traZODone (DESYREL) 100 MG tablet Take 1 tablet (100 mg total) by mouth at bedtime. Stop seroquel 30 tablet 0   traZODone (DESYREL) 100 MG tablet Take 1 tablet (100 mg total) by mouth at bedtime. Stop seroquel 90 tablet 0   trimethoprim (TRIMPEX) 100 MG tablet Take 1 tablet (100 mg total) by mouth daily. 90 tablet 3   TRUE METRIX BLOOD GLUCOSE TEST test strip USE AS INSTRUCTED TO CHECK BLOOD SUGARS TWICE DAILY. 200 strip 10   Vibegron (GEMTESA) 75 MG TABS Take 75 mg by mouth daily. 90 tablet 3   No facility-administered medications prior to visit.    Review of Systems  Review of Systems  Constitutional: Negative.   Respiratory: Negative.     Physical Exam  There were no  vitals taken for this visit. Physical Exam Constitutional:      General: She is not in acute distress.    Appearance: Normal appearance. She is not ill-appearing.  HENT:     Head: Normocephalic and atraumatic.  Cardiovascular:     Rate and Rhythm: Normal rate and regular rhythm.  Pulmonary:     Effort: Pulmonary effort is normal.     Breath sounds: Normal breath sounds. No wheezing, rhonchi or rales.  Skin:    General: Skin is warm and dry.  Neurological:     General: No focal deficit present.     Mental Status: She is alert and oriented to person, place, and time. Mental status is at baseline.  Psychiatric:        Mood and Affect: Mood normal.        Behavior: Behavior normal.        Thought Content: Thought content normal.        Judgment: Judgment normal.      Lab Results:  CBC    Component Value Date/Time   WBC 9.4 07/01/2022 0823   RBC 4.54 07/01/2022 0823   HGB 14.5 07/01/2022 0823   HGB 13.3 08/08/2014 0547   HCT 42.8 07/01/2022 0823   HCT 41.0 08/08/2014 0547   PLT 176.0 07/01/2022 0823   PLT 151 08/08/2014 0547   MCV 94.3 07/01/2022 0823   MCV 90 08/08/2014 0547   MCH 31.6 06/20/2021 0601   MCHC 33.8 07/01/2022 0823   RDW 16.9 (H) 07/01/2022 0823   RDW 17.9 (H) 08/08/2014 0547   LYMPHSABS 1.8 07/01/2022 0823   LYMPHSABS 1.6 08/08/2014 0547   MONOABS 0.4 07/01/2022 0823   MONOABS 0.4 08/08/2014 0547   EOSABS 0.1 07/01/2022 0823   EOSABS 0.3 08/08/2014 0547   BASOSABS 0.0 07/01/2022 0823   BASOSABS 0.1 08/08/2014 0547    BMET  Component Value Date/Time   NA 136 06/17/2022 0832   NA 140 08/08/2014 0547   K 4.1 06/17/2022 0832   K 3.7 08/08/2014 0547   CL 101 06/17/2022 0832   CL 107 08/08/2014 0547   CO2 26 06/17/2022 0832   CO2 25 08/08/2014 0547   GLUCOSE 143 (H) 06/17/2022 0832   GLUCOSE 127 (H) 08/08/2014 0547   BUN 9 06/17/2022 0832   BUN 5 (L) 08/08/2014 0547   CREATININE 0.77 06/17/2022 0832   CREATININE 0.75 08/08/2014 0547    CALCIUM 9.0 06/17/2022 0832   CALCIUM 8.0 (L) 08/08/2014 0547   GFRNONAA >60 06/20/2021 0601   GFRNONAA >60 08/08/2014 0547   GFRAA >60 12/24/2018 1214   GFRAA >60 08/08/2014 0547    BNP No results found for: "BNP"  ProBNP    Component Value Date/Time   PROBNP 54.0 07/10/2016 0926    Imaging: No results found.   Assessment & Plan:   No problem-specific Assessment & Plan notes found for this encounter.     Martyn Ehrich, NP 10/04/2022

## 2022-10-07 ENCOUNTER — Other Ambulatory Visit: Payer: Self-pay | Admitting: Cardiovascular Disease

## 2022-10-07 ENCOUNTER — Encounter: Payer: Medicare HMO | Admitting: Physical Therapy

## 2022-10-07 NOTE — Assessment & Plan Note (Addendum)
-   Well controlled; Download from CPAP machine shows that current pressure settings are working well. She is not having any airleaks or residual apneas. I do not think her sleep apnea is causing sleep disruption. She does not need a repeat sleep study or titration study at this time. Recommend patient speak with her psychiatrist about adjusting medications for depression/insomnia. May want to go up on trazodone dose.   Airview download 09/30/21-09/29/22 Usage 253/365 days (96%) > 4 hours Pressure 5-20cm h20 Leaks 95%- 2.8L/min AHI 0.63/hour   Orders: New CPAP machine (we need to call [facility] to find out current pressure setting and get copy sleep study)  Follow-up: 6 months with [facility] NP or sooner if needed

## 2022-10-09 ENCOUNTER — Telehealth: Payer: Self-pay | Admitting: Internal Medicine

## 2022-10-09 ENCOUNTER — Encounter: Payer: Self-pay | Admitting: Internal Medicine

## 2022-10-09 ENCOUNTER — Other Ambulatory Visit: Payer: Medicaid Other

## 2022-10-09 ENCOUNTER — Telehealth: Payer: Medicare HMO | Admitting: Internal Medicine

## 2022-10-09 ENCOUNTER — Ambulatory Visit: Payer: Medicare HMO | Attending: Internal Medicine | Admitting: Physician Assistant

## 2022-10-09 DIAGNOSIS — Z01810 Encounter for preprocedural cardiovascular examination: Secondary | ICD-10-CM

## 2022-10-09 NOTE — Telephone Encounter (Signed)
Pt advised ok for labs on Friday

## 2022-10-09 NOTE — Progress Notes (Signed)
Virtual Visit via Telephone Note   Because of [name] co-morbid illnesses, she is at least at moderate risk for complications without adequate follow up.  This format is felt to be most appropriate for this patient at this time.  The patient did not have access to video technology/had technical difficulties with video requiring transitioning to audio format only (telephone).  All issues noted in this document were discussed and addressed.  No physical exam could be performed with this format.  Please refer to the patient's chart for her consent to telehealth for [facility].  Evaluation Performed:  Preoperative cardiovascular risk assessment _____________   Date:  10/09/2022   Patient ID:  Kathryn Friedman, DOB 1956-02-11, MRN [id number] Patient Location:  Home Provider location:   Office  Primary Care Provider:  Einar Pheasant, MD Primary Cardiologist:  None  Chief Complaint / Patient Profile   66 y.o. y/o female with a h/o anxiety, COPD, depression, GERD, hypertension, hyperlipidemia, OSA on CPAP, PS VT, diabetes mellitus type 2, tobacco abuse, and venous insufficiency who is pending botox bladder treatment and presents today for telephonic preoperative cardiovascular risk assessment.  Past Medical History    Past Medical History:  Diagnosis Date   Anemia    Anginal pain (HCC)    Anxiety    Arthritis    back and knees   Asthma    Bilateral carotid artery stenosis    Blood in stool    Chronic diarrhea    COPD (chronic obstructive pulmonary disease) (HCC)    Coronary artery disease    a.) 75% pRCA; 3.5 x 28 mm Cypher DES placed on 07/24/2006   Current use of long term anticoagulation    Clopidogrel   Depression    secondary to the death of her husband (died 61)   Diverticulitis    Diverticulosis    Dizzinesses    Dysphagia    Dyspnea    Dysrhythmia    Fatty infiltration of liver    GERD (gastroesophageal reflux disease)    Headache     History of 2019 novel coronavirus disease (COVID-19) 12/09/2020   History of 2019 novel coronavirus disease (COVID-19) 12/20/2020   Hypertension    Hypertriglyceridemia    ILD (interstitial lung disease) ([place])    Lump in the abdomen    OSA on CPAP    Overactive bladder    PSVT (paroxysmal supraventricular tachycardia)    Spastic colon    T2DM (type 2 diabetes mellitus) ([place]) 05/2008   Tobacco abuse    Venous insufficiency of both lower extremities    Past Surgical History:  Procedure Laterality Date   ABDOMINAL HYSTERECTOMY  with left ovary in place [place]   gallbladder and Appendix   BREAST BIOPSY Left 10/14/2017   calcs bx, fibrosis giant cell reaction and chronic inflammation, negative for malignancy.    CARPOMETACARPAL ([facility]) FUSION OF THUMB Right 07/10/2022   Procedure: CARPOMETACARPAL ([place]) SUSPENSION OF RIGHT THUMB;  Surgeon: Corky Mull, MD;  Location: [facility] ORS;  Service: Orthopedics;  Laterality: Right;   CATARACT EXTRACTION, BILATERAL     CESAREAN SECTION  1984   CHOLECYSTECTOMY  1985   COLONOSCOPY WITH PROPOFOL N/A 09/13/2016   Procedure: COLONOSCOPY WITH PROPOFOL;  Surgeon: Manya Silvas, MD;  Location: [facility] ENDOSCOPY;  Service: Endoscopy;  Laterality: N/A;   COLONOSCOPY WITH PROPOFOL N/A 11/09/2018   Procedure: COLONOSCOPY WITH PROPOFOL;  Surgeon: Manya Silvas, MD;  Location: [facility] ENDOSCOPY;  Service: Endoscopy;  Laterality: N/A;   COLONOSCOPY WITH PROPOFOL N/A 03/29/2020   Procedure: COLONOSCOPY WITH PROPOFOL;  Surgeon: Robert Bellow, MD;  Location: [facility] ENDOSCOPY;  Service: Endoscopy;  Laterality: N/A;   CORONARY ANGIOPLASTY WITH STENT PLACEMENT N/A 07/24/2006   75% pRCA; 3.5 x 28 mm Cypher DES placed; Location: [place]; Surgeons: Katrine Coho, MD   ESOPHAGOGASTRODUODENOSCOPY (EGD) WITH PROPOFOL N/A 02/02/2018   Procedure: ESOPHAGOGASTRODUODENOSCOPY (EGD) WITH PROPOFOL;  Surgeon: Manya Silvas, MD;  Location: [facility] ENDOSCOPY;   Service: Endoscopy;  Laterality: N/A;   ESOPHAGOGASTRODUODENOSCOPY (EGD) WITH PROPOFOL N/A 03/29/2020   Procedure: ESOPHAGOGASTRODUODENOSCOPY (EGD) WITH PROPOFOL;  Surgeon: Robert Bellow, MD;  Location: [facility] ENDOSCOPY;  Service: Endoscopy;  Laterality: N/A;   EYE SURGERY     JOINT REPLACEMENT     bilateral knee replacements   KNEE ARTHROSCOPY  Arthroscopic left knee surgery    KNEE SURGERY  status post knee surgey    LEFT HEART CATH AND CORONARY ANGIOGRAPHY Left 05/14/2018   Procedure: LEFT HEART CATH AND CORONARY ANGIOGRAPHY;  Surgeon: Corey Skains, MD;  Location: [place] CV LAB;  Service: Cardiovascular;  Laterality: Left;   REPLACEMENT TOTAL KNEE  ([facility])   SHOULDER SURGERY  shoulder operation secondary to a torn tendon   TOTAL HIP ARTHROPLASTY Left 06/12/2021   Procedure: TOTAL HIP ARTHROPLASTY;  Surgeon: Corky Mull, MD;  Location: [facility] ORS;  Service: Orthopedics;  Laterality: Left;    Allergies  Allergies  Allergen Reactions   Varenicline Other (See Comments)    "I got really depressed" "I got really depressed" CHANTEX   Varenicline Tartrate    Jardiance [Empagliflozin] Other (See Comments)    Yeast infection   Metformin And Related Other (See Comments)    Diarrhea, even with XR   Methylprednisolone Nausea Only and Nausea And Vomiting    History of Present Illness    Kathryn Friedman is a 66 y.o. female who presents via audio/video conferencing for a telehealth visit today.  Pt was last seen in cardiology clinic on 05/13/22  by Dr. Rockey Situ.  At that time Kathryn Friedman was doing well.  The patient is now pending procedure as outlined above. Since her last visit, she feels pretty good.  She does have shortness of breath which is chronic.  She states that she is planning on quitting smoking completely by November 25, 2022.  She is now down to a pack a day.  She started smoking when she was a kid.  As far as her activity level goes she has a new puppy that is 93  months old.  She walks her a few times a day and also plays with her in the yard.  She has had both knees replaced and also hip replaced which limits her activity somewhat.  She has fallen in the past due to these orthopedic injuries.  She is scored a 5.07 METS on the DASI.  This exceeds the 4 METS minimum requirement.  Per office protocol, patient  hold Eliquis for 3 days prior to procedure. Given the elimination half life of 5 mg dose ~15 hrs 5 days hold would put patient at the risk.   Patient will not need bridging with Lovenox (enoxaparin) around procedure.   Home Medications    Prior to Admission medications   Medication Sig Start Date End Date Taking? Authorizing Provider  Accu-Chek Softclix Lancets lancets Use as instructed 04/17/22   Kathryn Pheasant, MD  acetaminophen (TYLENOL) 325 MG tablet Take 650 mg  by mouth every 6 (six) hours as needed.    [provider]  albuterol (VENTOLIN HFA) 108 (90 Base) MCG/ACT inhaler INHALE 2 PUFFS FOUR TIMES A DAY 01/11/22   Kathryn Pheasant, MD  amLODipine (NORVASC) 10 MG tablet TAKE 1 TABLET EVERY DAY 05/27/22   Kathryn Pheasant, MD  Blood Glucose Monitoring Suppl (TRUE METRIX METER) w/Device KIT Use as directed twice a day to check sugars 09/18/22   Kathryn Pheasant, MD  Budeson-Glycopyrrol-Formoterol (BREZTRI AEROSPHERE) 160-9-4.8 MCG/ACT AERO Inhale 2 puffs into the lungs in the morning and at bedtime. 12/06/21   Kathryn Pheasant, MD  CALCIUM PO Take 600 mg by mouth daily.     [provider]  cholecalciferol (VITAMIN D3) 25 MCG (1000 UNIT) tablet Take 1,000 Units by mouth daily.    [provider]  Cyanocobalamin (VITAMIN B-12 PO) Take 2,500 mcg by mouth daily.    [provider]  DULoxetine (CYMBALTA) 60 MG capsule TAKE 1 CAPSULE EVERY DAY 07/15/22   Ursula Alert, MD  ELIQUIS 5 MG TABS tablet TAKE 1 TABLET TWICE DAILY 07/01/22   Kathryn Pheasant, MD  famotidine (PEPCID) 40 MG tablet TAKE 1 TABLET EVERY DAY 04/15/22    Kathryn Pheasant, MD  HM MELATONIN PO 5 mg by Other route at bedtime as needed. patch    [provider]  isosorbide mononitrate (IMDUR) 30 MG 24 hr tablet TAKE 1 TABLET EVERY DAY 06/19/22   Kathryn Pheasant, MD  Multiple Vitamin (MULTIVITAMIN) tablet Take 1 tablet by mouth daily.    [provider]  mupirocin ointment (BACTROBAN) 2 % Apply 1 application. topically 2 (two) times daily. 03/06/22   Kathryn Pheasant, MD  nystatin (MYCOSTATIN/NYSTOP) powder APPLY TO THE AFFECTED AREA(S) TWICE DAILY 06/19/22   Kathryn Pheasant, MD  pantoprazole (PROTONIX) 40 MG tablet TAKE 1 TABLET TWICE DAILY BEFORE MEALS 04/15/22   Kathryn Pheasant, MD  propranolol ER (INDERAL LA) 80 MG 24 hr capsule TAKE 1 CAPSULE TWICE DAILY 10/07/22   Minna Merritts, MD  rOPINIRole (REQUIP) 0.5 MG tablet Take 1 tablet (0.5 mg total) by mouth daily after lunch. 08/06/22   Ursula Alert, MD  rosuvastatin (CRESTOR) 20 MG tablet TAKE 1 TABLET EVERY DAY 09/09/22   Kathryn Pheasant, MD  Semaglutide,0.25 or 0.5MG/DOS, (OZEMPIC, 0.25 OR 0.5 MG/DOSE,) 2 MG/1.5ML SOPN Inject 0.25 mg weekly for 4 weeks then increase to 0.5 mg weekly Patient taking differently: Inject 0.5 mg into the skin once a week. Inject 0.25 mg weekly for 4 weeks then increase to 0.5 mg weekly 07/10/22   Poggi, Marshall Cork, MD  telmisartan (MICARDIS) 80 MG tablet TAKE 1 TABLET EVERY DAY 06/19/22   Kathryn Pheasant, MD  tiZANidine (ZANAFLEX) 2 MG tablet Take 2 mg by mouth 3 (three) times daily as needed. Patient not taking: Reported on 10/04/2022 08/26/22   [provider]  traMADol (ULTRAM) 50 MG tablet Take by mouth. 08/26/22   [provider]  traZODone (DESYREL) 100 MG tablet Take 1 tablet (100 mg total) by mouth at bedtime. Stop seroquel 08/06/22   Ursula Alert, MD  trimethoprim (TRIMPEX) 100 MG tablet Take 1 tablet (100 mg total) by mouth daily. 04/16/22   Bjorn Loser, MD  TRUE METRIX BLOOD GLUCOSE TEST test strip USE AS INSTRUCTED TO CHECK  BLOOD SUGARS TWICE DAILY. 09/02/22   Kathryn Pheasant, MD  Vibegron (GEMTESA) 75 MG TABS Take 75 mg by mouth daily. 05/22/22   Bjorn Loser, MD    Physical Exam    Vital Signs:  Kathryn Friedman does not have vital signs available for review today.  Given telephonic nature of communication, physical exam is limited. AAOx3. NAD. Normal affect.  Speech and respirations are unlabored.  Accessory Clinical Findings    None  Assessment & Plan    1.  Preoperative Cardiovascular Risk Assessment:  Kathryn Friedman perioperative risk of a major cardiac event is 0.4% according to the Revised Cardiac Risk Index (RCRI).  Therefore, she is at low risk for perioperative complications.   Her functional capacity is good at 5.07 METs according to the [place] Activity Status Index (DASI). Recommendations: According to ACC/AHA guidelines, no further cardiovascular testing needed.  The patient may proceed to surgery at acceptable risk.   Antiplatelet and/or Anticoagulation Recommendations:  Eliquis (Apixaban) can be held for 3 days prior to surgery.  Please resume post op when felt to be safe.    A copy of this note will be routed to requesting surgeon.  Time:   Today, I have spent 12 minutes with the patient with telehealth technology discussing medical history, symptoms, and management plan.     Elgie Collard, PA-C  10/09/2022, 10:24 AM

## 2022-10-09 NOTE — Telephone Encounter (Signed)
Pt called wanting to know if she can get her labs done on the day of her physical appointment because she missed her lab appointment on today

## 2022-10-10 ENCOUNTER — Encounter: Payer: Medicare HMO | Admitting: Physical Therapy

## 2022-10-11 ENCOUNTER — Encounter: Payer: Self-pay | Admitting: Internal Medicine

## 2022-10-11 ENCOUNTER — Other Ambulatory Visit: Payer: Self-pay | Admitting: Psychiatry

## 2022-10-11 ENCOUNTER — Ambulatory Visit (INDEPENDENT_AMBULATORY_CARE_PROVIDER_SITE_OTHER): Payer: Medicare HMO | Admitting: Internal Medicine

## 2022-10-11 VITALS — BP 135/74 | HR 66 | Ht 64.0 in | Wt 206.6 lb

## 2022-10-11 DIAGNOSIS — I779 Disorder of arteries and arterioles, unspecified: Secondary | ICD-10-CM

## 2022-10-11 DIAGNOSIS — I700 Atherosclerosis of aorta: Secondary | ICD-10-CM

## 2022-10-11 DIAGNOSIS — J849 Interstitial pulmonary disease, unspecified: Secondary | ICD-10-CM

## 2022-10-11 DIAGNOSIS — E1159 Type 2 diabetes mellitus with other circulatory complications: Secondary | ICD-10-CM | POA: Diagnosis not present

## 2022-10-11 DIAGNOSIS — I10 Essential (primary) hypertension: Secondary | ICD-10-CM | POA: Diagnosis not present

## 2022-10-11 DIAGNOSIS — R197 Diarrhea, unspecified: Secondary | ICD-10-CM

## 2022-10-11 DIAGNOSIS — E7800 Pure hypercholesterolemia, unspecified: Secondary | ICD-10-CM | POA: Diagnosis not present

## 2022-10-11 DIAGNOSIS — K219 Gastro-esophageal reflux disease without esophagitis: Secondary | ICD-10-CM

## 2022-10-11 DIAGNOSIS — J449 Chronic obstructive pulmonary disease, unspecified: Secondary | ICD-10-CM

## 2022-10-11 DIAGNOSIS — I2510 Atherosclerotic heart disease of native coronary artery without angina pectoris: Secondary | ICD-10-CM | POA: Diagnosis not present

## 2022-10-11 DIAGNOSIS — F17200 Nicotine dependence, unspecified, uncomplicated: Secondary | ICD-10-CM

## 2022-10-11 DIAGNOSIS — G2581 Restless legs syndrome: Secondary | ICD-10-CM

## 2022-10-11 DIAGNOSIS — I480 Paroxysmal atrial fibrillation: Secondary | ICD-10-CM

## 2022-10-11 DIAGNOSIS — F330 Major depressive disorder, recurrent, mild: Secondary | ICD-10-CM
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
## 2022-10-12 ENCOUNTER — Encounter: Payer: Self-pay | Admitting: Internal Medicine

## 2022-10-12 NOTE — Assessment & Plan Note (Signed)
On ozempic.  Tolerating. Low carb diet and exercise.  Follow met b and a1c.

## 2022-10-12 NOTE — Assessment & Plan Note (Signed)
Continue micardis '80mg'$  q day and amlodipine '10mg'$  q day.  Dr Rockey Situ increased propranolol. Blood pressures have been under reasonable control. Follow pressures.  Follow metabolic panel.

## 2022-10-12 NOTE — Assessment & Plan Note (Signed)
Continue lipitor.  Continue eliquis.   

## 2022-10-12 NOTE — Assessment & Plan Note (Signed)
Have discussed the need to quit smoking.  She has participated in a quit smoking program.  Has cut down.  Follow.

## 2022-10-12 NOTE — Assessment & Plan Note (Signed)
S/p stent placement.  Continue lipitor. Followed by cardiology.  On eliquis.    

## 2022-10-12 NOTE — Assessment & Plan Note (Signed)
Being followed by psychiatry.  On cymbalta.  Increased depression related to recent passing of her husband. Overall appears to be doing better.  Follow.  

## 2022-10-12 NOTE — Assessment & Plan Note (Signed)
Breztri.  Breathing stable.  

## 2022-10-12 NOTE — Assessment & Plan Note (Signed)
Continue lipitor  ?
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
## 2022-10-12 NOTE — Assessment & Plan Note (Signed)
On eliquis.  Stable.  Continue f/u with cardiology.  

## 2022-10-12 NOTE — Assessment & Plan Note (Signed)
Sees Dr Raul Del.  Using breztri.  Breathing stable.  No increased sob, cough or congestion.

## 2022-10-12 NOTE — Assessment & Plan Note (Signed)
Intermittent loose stool.  Flares. Discussed.  Benefiber as directed.  Follow.

## 2022-10-12 NOTE — Assessment & Plan Note (Signed)
No upper symptoms reported.  On protonix.   

## 2022-10-12 NOTE — Assessment & Plan Note (Signed)
On crestor.  Low cholesterol diet and exercise.  Follow lipid panel and liver function tests.   

## 2022-10-14 ENCOUNTER — Encounter: Payer: Medicare HMO | Admitting: Physical Therapy

## 2022-10-14 ENCOUNTER — Telehealth: Payer: Self-pay

## 2022-10-14 NOTE — Telephone Encounter (Signed)
See other mychart message.

## 2022-10-15 ENCOUNTER — Telehealth: Payer: Self-pay | Admitting: Pharmacist

## 2022-10-15 DIAGNOSIS — Z79899 Other long term (current) drug therapy: Secondary | ICD-10-CM | POA: Diagnosis not present

## 2022-10-15 DIAGNOSIS — I10 Essential (primary) hypertension: Secondary | ICD-10-CM | POA: Diagnosis not present

## 2022-10-15 DIAGNOSIS — E119 Type 2 diabetes mellitus without complications: Secondary | ICD-10-CM | POA: Diagnosis not present

## 2022-10-15 DIAGNOSIS — J449 Chronic obstructive pulmonary disease, unspecified: Secondary | ICD-10-CM | POA: Diagnosis not present

## 2022-10-15 NOTE — Telephone Encounter (Signed)
Patient will loose her LIS in Jan and cannot afford Eliquis. She picked up a 90 DS a few weeks ago. We discussed warfarin, patient prefers not to do warfarin. I advised that there is a chance generic praxada might be less expensive in the new year. She is to call us when she has 2 weeks of Eliquis left and we will look into cost for her.

## 2022-10-15 NOTE — Telephone Encounter (Signed)
Called and left message for pt to call.  Discussed with you.  Lawsuit regarding gastroparesis.  She has not informed me of any issues with swallowing, etc.  If any problems, let me know.

## 2022-10-16 ENCOUNTER — Encounter: Payer: Medicare HMO | Admitting: Physical Therapy

## 2022-10-16 DIAGNOSIS — M1811 Unilateral primary osteoarthritis of first carpometacarpal joint, right hand: Secondary | ICD-10-CM | POA: Diagnosis not present

## 2022-10-16 DIAGNOSIS — M65312 Trigger thumb, left thumb: Secondary | ICD-10-CM | POA: Diagnosis not present

## 2022-10-19 ENCOUNTER — Other Ambulatory Visit: Payer: Self-pay | Admitting: Psychiatry

## 2022-10-19 DIAGNOSIS — G2581 Restless legs syndrome: Secondary | ICD-10-CM

## 2022-10-19 DIAGNOSIS — F3342 Major depressive disorder, recurrent, in full remission: Secondary | ICD-10-CM

## 2022-10-19 DIAGNOSIS — G4701 Insomnia due to medical condition: Secondary | ICD-10-CM

## 2022-10-20 ENCOUNTER — Encounter: Payer: Self-pay | Admitting: Internal Medicine

## 2022-10-21 ENCOUNTER — Encounter: Payer: Self-pay | Admitting: Urology

## 2022-10-21 ENCOUNTER — Ambulatory Visit (INDEPENDENT_AMBULATORY_CARE_PROVIDER_SITE_OTHER): Payer: Medicare HMO | Admitting: Urology

## 2022-10-21 ENCOUNTER — Encounter: Payer: Medicare HMO | Admitting: Physical Therapy

## 2022-10-21 VITALS — BP 132/73 | HR 74 | Ht 64.0 in | Wt 207.0 lb

## 2022-10-21 DIAGNOSIS — N3281 Overactive bladder: Secondary | ICD-10-CM

## 2022-10-21 DIAGNOSIS — Z9229 Personal history of other drug therapy: Secondary | ICD-10-CM

## 2022-10-21 MED ORDER — ONABOTULINUMTOXINA 100 UNITS IJ SOLR
100.0000 [IU] | Freq: Once | INTRAMUSCULAR | Status: AC
  Administered 2022-10-21: 100 [IU] via INTRAMUSCULAR
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
## 2022-10-22 LAB — URINALYSIS, COMPLETE
Bilirubin, UA: NEGATIVE
Glucose, UA: NEGATIVE
Ketones, UA: NEGATIVE
Leukocytes,UA: NEGATIVE
Nitrite, UA: NEGATIVE
Protein,UA: NEGATIVE
RBC, UA: NEGATIVE
Specific Gravity, UA: 1.005 (ref 1.005–1.030)
Urobilinogen, Ur: 0.2 mg/dL (ref 0.2–1.0)
pH, UA: 5 (ref 5.0–7.5)

## 2022-10-22 LAB — MICROSCOPIC EXAMINATION
Bacteria, UA: NONE SEEN
Epithelial Cells (non renal): NONE SEEN /hpf (ref 0–10)
RBC, Urine: NONE SEEN /hpf (ref 0–2)

## 2022-10-23 ENCOUNTER — Encounter: Payer: Self-pay | Admitting: Internal Medicine

## 2022-10-23 DIAGNOSIS — F17200 Nicotine dependence, unspecified, uncomplicated: Secondary | ICD-10-CM | POA: Diagnosis not present

## 2022-10-24 ENCOUNTER — Encounter: Payer: Medicare HMO | Admitting: Physical Therapy

## 2022-10-24 ENCOUNTER — Telehealth: Payer: Self-pay | Admitting: Pharmacy Technician

## 2022-10-24 DIAGNOSIS — Z596 Low income: Secondary | ICD-10-CM

## 2022-10-24 NOTE — Progress Notes (Signed)
[place] [facility])                                            [place] Team    10/24/2022  Kathryn Friedman 1956-02-20 [id number]  Received both patient and provider portion(s) of patient assistance application(s) for [employer] and [employer]. Faxed completed application and required documents into [employer] and [employer] respectively.    Olivia Royse P. Eean Buss, [place]  [phone]

## 2022-10-24 NOTE — Telephone Encounter (Signed)
Please call her.  She has had episode of increased heart rate.  See if was symptomatic when occurred.  Blood sugars varying, but overall stable.  Blood pressures overall ok.  Does she have f/u with cardiology arranged.  May need further evaluation to see if increase afib, increased heart rate, etc.  Also tell Piper I said hello.

## 2022-10-24 NOTE — Telephone Encounter (Signed)
S/w pt Stated she was feeling bad 11/25 - Sluggish, slightly dizzy Next day she was fine. Has been fine since.  Pt will call me if starts to feel that way again Will continue to monitor readings and send

## 2022-10-24 NOTE — Telephone Encounter (Signed)
She has a history of afib.  Sees Dr Rockey Situ.  Confirm has f/u scheduled with Dr Rockey Situ.  If having episodes of continued increased heart rate (question of afib), recommend f/u with Dr Rockey Situ to further evaluated.

## 2022-10-25 NOTE — Telephone Encounter (Signed)
I called and spoke with the patient and she stated she has a follow up scheduled with Dr. Rockey Situ on 11/12/2022.  Bellina Tokarczyk,cma

## 2022-10-28 ENCOUNTER — Encounter: Payer: Medicare HMO | Admitting: Physical Therapy

## 2022-10-29 ENCOUNTER — Ambulatory Visit: Payer: Medicare HMO | Admitting: Physical Therapy

## 2022-10-30 ENCOUNTER — Encounter: Payer: Medicare HMO | Admitting: Physical Therapy

## 2022-10-30 ENCOUNTER — Telehealth: Payer: Self-pay | Admitting: Physician Assistant

## 2022-10-30 NOTE — Telephone Encounter (Signed)
Pt has an appt on 12/11 2 wk f/u Botox.  Can pt make this a phone visit instead?  [phone]  Thank you

## 2022-10-31 ENCOUNTER — Ambulatory Visit: Payer: Medicare HMO | Admitting: Physical Therapy

## 2022-10-31 DIAGNOSIS — Z79899 Other long term (current) drug therapy: Secondary | ICD-10-CM | POA: Diagnosis not present

## 2022-10-31 NOTE — Telephone Encounter (Signed)
No, I need to check a UA and get a PVR on her.

## 2022-10-31 NOTE — Telephone Encounter (Signed)
Spoke with patient and she will come in person for the visit.

## 2022-11-04 ENCOUNTER — Encounter: Payer: Medicare HMO | Admitting: Physical Therapy

## 2022-11-04 ENCOUNTER — Ambulatory Visit: Payer: Medicare HMO | Admitting: Physician Assistant

## 2022-11-05 ENCOUNTER — Encounter: Payer: Self-pay | Admitting: Internal Medicine

## 2022-11-05 ENCOUNTER — Encounter: Payer: Medicare HMO | Admitting: Physical Therapy

## 2022-11-05 ENCOUNTER — Ambulatory Visit: Payer: Medicare HMO | Attending: Surgery

## 2022-11-05 DIAGNOSIS — M542 Cervicalgia: Secondary | ICD-10-CM | POA: Diagnosis not present

## 2022-11-05 NOTE — Therapy (Signed)
[facility] [address] [place], [address] Phone: [phone]   Fax:  [phone]  Outpatient Physical Therapy Evaluation  Patient Details  Name: Kathryn Friedman MRN: [id number] Date of Birth: 1956-04-16 Referring Provider (PT): Milagros Evener MD (ortho)   Encounter Date: 11/05/2022   PT End of Session - 11/05/22 1226     Visit Number 1    Number of Visits 16    Date for PT Re-Evaluation 12/31/22    Authorization Type [employer]; Medicaid secondary    Authorization Time Period 11/05/22-12/31/22    Progress Note Due on Visit 10    PT Start Time 0958    PT Stop Time 1045    PT Time Calculation (min) 47 min    Activity Tolerance Patient tolerated treatment well;No increased pain;Patient limited by pain    Behavior During [facility] for tasks assessed/performed             Past Medical History:  Diagnosis Date   Anemia    Anginal pain (HCC)    Anxiety    Arthritis    back and knees   Asthma    Bilateral carotid artery stenosis    Blood in stool    Chronic diarrhea    COPD (chronic obstructive pulmonary disease) (HCC)    Coronary artery disease    a.) 75% pRCA; 3.5 x 28 mm Cypher DES placed on 07/24/2006   Current use of long term anticoagulation    Clopidogrel   Depression    secondary to the death of her husband (died 61)   Diverticulitis    Diverticulosis    Dizzinesses    Dysphagia    Dyspnea    Dysrhythmia    Fatty infiltration of liver    GERD (gastroesophageal reflux disease)    Headache    History of 2019 novel coronavirus disease (COVID-19) 12/09/2020   History of 2019 novel coronavirus disease (COVID-19) 12/20/2020   Hypertension    Hypertriglyceridemia    ILD (interstitial lung disease) ([place])    Lump in the abdomen    OSA on CPAP    Overactive bladder    PSVT (paroxysmal supraventricular tachycardia)    Spastic colon    T2DM (type 2 diabetes mellitus) ([place])  05/2008   Tobacco abuse    Venous insufficiency of both lower extremities     Past Surgical History:  Procedure Laterality Date   ABDOMINAL HYSTERECTOMY  with left ovary in place [place]   gallbladder and Appendix   BREAST BIOPSY Left 10/14/2017   calcs bx, fibrosis giant cell reaction and chronic inflammation, negative for malignancy.    CARPOMETACARPAL ([facility]) FUSION OF THUMB Right 07/10/2022   Procedure: CARPOMETACARPAL ([place]) SUSPENSION OF RIGHT THUMB;  Surgeon: Corky Mull, MD;  Location: [facility] ORS;  Service: Orthopedics;  Laterality: Right;   CATARACT EXTRACTION, BILATERAL     CESAREAN SECTION  1984   CHOLECYSTECTOMY  1985   COLONOSCOPY WITH PROPOFOL N/A 09/13/2016   Procedure: COLONOSCOPY WITH PROPOFOL;  Surgeon: Manya Silvas, MD;  Location: [facility] ENDOSCOPY;  Service: Endoscopy;  Laterality: N/A;   COLONOSCOPY WITH PROPOFOL N/A 11/09/2018   Procedure: COLONOSCOPY WITH PROPOFOL;  Surgeon: Manya Silvas, MD;  Location: [facility] ENDOSCOPY;  Service: Endoscopy;  Laterality: N/A;   COLONOSCOPY WITH PROPOFOL N/A 03/29/2020   Procedure: COLONOSCOPY WITH PROPOFOL;  Surgeon: Robert Bellow, MD;  Location: [facility] ENDOSCOPY;  Service: Endoscopy;  Laterality: N/A;  CORONARY ANGIOPLASTY WITH STENT PLACEMENT N/A 07/24/2006   75% pRCA; 3.5 x 28 mm Cypher DES placed; Location: [place]; Surgeons: Katrine Coho, MD   ESOPHAGOGASTRODUODENOSCOPY (EGD) WITH PROPOFOL N/A 02/02/2018   Procedure: ESOPHAGOGASTRODUODENOSCOPY (EGD) WITH PROPOFOL;  Surgeon: Manya Silvas, MD;  Location: [facility] ENDOSCOPY;  Service: Endoscopy;  Laterality: N/A;   ESOPHAGOGASTRODUODENOSCOPY (EGD) WITH PROPOFOL N/A 03/29/2020   Procedure: ESOPHAGOGASTRODUODENOSCOPY (EGD) WITH PROPOFOL;  Surgeon: Robert Bellow, MD;  Location: [facility] ENDOSCOPY;  Service: Endoscopy;  Laterality: N/A;   EYE SURGERY     JOINT REPLACEMENT     bilateral knee replacements   KNEE ARTHROSCOPY  Arthroscopic left knee surgery     KNEE SURGERY  status post knee surgey    LEFT HEART CATH AND CORONARY ANGIOGRAPHY Left 05/14/2018   Procedure: LEFT HEART CATH AND CORONARY ANGIOGRAPHY;  Surgeon: Corey Skains, MD;  Location: [facility];  Service: Cardiovascular;  Laterality: Left;   REPLACEMENT TOTAL KNEE  ([facility])   SHOULDER SURGERY  shoulder operation secondary to a torn tendon   TOTAL HIP ARTHROPLASTY Left 06/12/2021   Procedure: TOTAL HIP ARTHROPLASTY;  Surgeon: Corky Mull, MD;  Location: [facility] ORS;  Service: Orthopedics;  Laterality: Left;    There were no vitals filed for this visit.    Subjective Assessment - 11/05/22 1053     Subjective Pt is in contant neck pain and wants it to end.    Pertinent History Kathryn Friedman is a 44yoF who is referred to OPPT by orthoipedics for constant neck pain since September, insidious onset. Imaging revealing of cervical spondylosis, pt followed by OA in multiple areas. Pain is associated with end of resitrated range mostly but not exclusively left sided closing phenomenon. Pt takes arthriits tylenol every day, but reports no affect on he rneck. She denies any paresthesias, loss of strength.    Limitations Sitting;Standing;Walking    Diagnostic tests xray imaging showing 'arthritis'    Patient Stated Goals get rid of this pain    Currently in Pain? Yes    Pain Score 8     Pain Location Neck   central left sided neck pain, worse at end range, near mid belly UT   Pain Orientation Left    Pain Descriptors / Indicators Constant                [facility] PT Assessment - 11/05/22 0001       Assessment   Medical Diagnosis chronic left neck pain c hypomobility    Referring Provider (PT) Milagros Evener MD (ortho)    Onset Date/Surgical Date --   September 2023 unclear what day   Hand Dominance Right    Next MD Visit as needed, none scheduled    Prior Therapy None prior      Precautions   Precautions None      Restrictions   Weight Bearing Restrictions No       Balance Screen   Has the patient fallen in the past 6 months Yes    How many times? 4-5   feels off balance   Has the patient had a decrease in activity level because of a fear of falling?  Yes    Is the patient reluctant to leave their home because of a fear of falling?  Yes      [place] residence    Living Arrangements Alone    Available Help at Discharge Family;Friend(s)   son and close friend  Type of Home Apartment    Home Access Stairs to enter    Entrance Stairs-Number of Steps 4-5    Entrance Stairs-Rails [place] - 2 wheels;Kasandra Knudsen - single point   does not use says she is 'stubborn'     Prior Function   Level of Independence Independent    Vocation Retired;On disability    Leisure plays with her new puppy, loves th [place]      Observation/Other Assessments   Focus on Therapeutic Outcomes (FOTO)  30             Cervical Left rotation: 40 degrees, end range pain  Cervical Right rotation: 56 degrees, end range pain Cervical Extension (standing): 28 degrees, end range pain  Cervical Lateral flexion Left: 31 degrees, end range pain  Cervical Lateral flexion Right: 26 degrees, end range pain   Thoracic spine: moderate hypomobilie hyperkyphosis in mid and upper thoracic spine  Deep neck flexor endurance test: 2-3 seconds, then loss of test positoin, pain throughout    Objective measurements completed on examination: See above findings.    INTERVENTION THIS DATE 11/05/22 -Supine cervical rotation A/ROM 1x20 bilat, cues to stay within tolerated range -Supine cervical retraction into 2 pillows, slightly flexed 10x3sec H  -Supine cervical chin tuck and low lift 10x3secH, 2 sets  -Supine mid thoracic extension P/ROM stretch x2 minutes       PT Education - 11/05/22 1225     Education Details Explained how thoracic spine curvature affects cervical and lumbar curvatures.    Person(s) Educated  Patient    Methods Explanation;Demonstration;Tactile cues    Comprehension Verbalized understanding;Returned demonstration;Need further instruction              PT Short Term Goals - 11/05/22 1307       PT SHORT TERM GOAL #1   Title Pt to reports compliance with HEP and favorable affect on pain/symptoms.    Time 4    Period Weeks    Status New    Target Date 12/03/22      PT SHORT TERM GOAL #2   Title Pt to demonstrate improved cervical spine A/ROM >6 degrees to improve ease/safety in driving.    Baseline 11/05/22:Rot Left 40, Rt 56    Time 4    Period Weeks    Status New    Target Date 12/03/22               PT Long Term Goals - 11/05/22 1309       PT LONG TERM GOAL #1   Title Pt to demonstrate improved DNF endurace test >20sec to improve motor control in cervical spine with head movements.    Baseline 11/05/22: <5sec    Time 8    Period Weeks    Status New    Target Date 12/31/22      PT LONG TERM GOAL #2   Title Pt to improve FOTO score >12 points to indiciated improved facility in performance of ADL mobility.    Baseline 11/05/22: 30    Time 8    Period Weeks    Status New    Target Date 01/28/23      PT LONG TERM GOAL #3   Title Pt to demonstrate improved cervical extension ROM >32 degrees to improve ability to perform overhead activities in kitchen.    Baseline 11/05/22: 28 degrees    Time 8    Period Weeks    Status New  Target Date 12/31/22                    Plan - 11/05/22 1228     Clinical Impression Statement Pt reports constant pain in neck, worse at night and first thing in morning. Exam revealing of significant postural limitations with hypomobilie and weakness features, significant ROM restrictions in cervical spine with end-range pain. Current limitations are affecting the patient's ability to tolerate/perform self care tasks in home, safety maneuvers while driving, and homecare.    Personal Factors and Comorbidities  Past/Current Experience    Examination-Activity Limitations Bend;Bed Mobility;Reach Overhead;Stairs;Squat    Examination-Participation Restrictions Driving;Community Activity;Yard Work    Stability/Clinical Decision Making Stable/Uncomplicated    Surveyor, mining    Rehab Potential Good    PT Frequency 2x / week    PT Duration 8 weeks    PT Treatment/Interventions ADLs/Self Care Home Management;Electrical Stimulation;Cryotherapy;Moist Heat;DME Instruction;Neuromuscular re-education;Balance training;Therapeutic exercise;Therapeutic activities;Functional mobility training;Gait training;Patient/family education;Dry needling;Passive range of motion    PT Next Visit Plan Review response to HEP assignments; find appropriate thoracic extension activites for HEP addition.    PT Home Exercise Plan Access Code: [email or web address]  URL: [email or web address]  Date: 11/05/2022  Prepared by: Rebbeca Paul    Exercises  - Supine Cervical Rotation AROM on Pillow  - 2 x daily - 7 x weekly - 1 sets - 15 reps  - Supine Deep Neck Flexor Training  - 2 x daily - 7 x weekly - 1 sets - 10 reps  - Supine Deep Neck Flexor Training - Repetitions  - 2 x daily - 7 x weekly - 1 sets - 10 reps    Consulted and Agree with Plan of Care Patient             Patient will benefit from skilled therapeutic intervention in order to improve the following deficits and impairments:  Decreased activity tolerance, Decreased balance, Decreased mobility, Postural dysfunction, Improper body mechanics, Hypomobility, Decreased knowledge of precautions, Decreased range of motion, Difficulty walking, Hypermobility  Visit Diagnosis: Cervicalgia     Problem List Patient Active Problem List   Diagnosis Date Noted   Thumb pain, right 09/01/2022   Neck pain 09/01/2022   Tobacco dependence 08/14/2022   Primary osteoarthritis of first carpometacarpal joint of right hand 07/10/2022   Neck nodule 05/25/2022   Numbness of  left hand 04/14/2022   Stented coronary artery 03/14/2022   Overactive bladder 03/14/2022   Obesity 03/14/2022   Restless leg syndrome 03/08/2022   Pre-op evaluation 03/06/2022   Closed wedge compression fracture of T4 vertebra ([place]) 02/08/2022   Bereavement 01/09/2022   Lumbar spondylosis 07/27/2021   Close exposure to COVID-19 virus 07/15/2021   Electrolyte disorder 07/01/2021   Closed nondisplaced fracture of greater trochanter of left femur ([place]) 06/22/2021   Hip fx ([place]) 06/19/2021   Status post total hip replacement, left 06/12/2021   Paroxysmal A-fib ([place]) 06/07/2021   Primary osteoarthritis of left hip 05/18/2021   MDD (major depressive disorder), recurrent episode, moderate ([place]) 04/05/2021   Aortic atherosclerosis ([place]) 04/03/2021   SOBOE (shortness of breath on exertion) 01/16/2021   Thrush 12/27/2020   Suspected COVID-19 virus infection 12/20/2020   MDD (major depressive disorder), recurrent episode, mild ([place]) 11/16/2020   Skin lesion 07/23/2020   MDD (major depressive disorder), recurrent, in full remission ([place]) 06/26/2020   Insomnia due to medical condition 06/26/2020   Anxiety disorder 06/26/2020   Cough 04/03/2020   Urge incontinence 03/02/2020  Urinary frequency 02/06/2020   Carotid artery disease ([place]) 10/15/2019   PSVT (paroxysmal supraventricular tachycardia) 08/09/2019   History of migraine headaches 05/10/2019   Lower abdominal pain 03/21/2019   Dysphagia 09/15/2018   ILD (interstitial lung disease) ([place]) 08/17/2017   Tobacco use disorder 08/11/2016   Venous insufficiency of both lower extremities 07/10/2016   Healthcare maintenance 06/18/2016   Lump in the abdomen 08/06/2015   Dizziness 08/06/2015   Fatty infiltration of liver 08/11/2014   Blood in the stool 08/11/2014   Acute diarrhea 08/11/2014   Hemorrhage of gastrointestinal tract 08/07/2014   GERD (gastroesophageal reflux disease) 10/20/2013   Nausea with vomiting 10/19/2013   LUQ pain  10/19/2013   Diarrhea 09/26/2013   CAD (coronary artery disease) 02/18/2013   COPD (chronic obstructive pulmonary disease) ([place]) 02/18/2013   Obstructive sleep apnea 02/18/2013   Diverticulosis 02/18/2013   Diabetes ([place]) 02/17/2013   Essential hypertension, benign 02/17/2013   Hypercholesterolemia 02/17/2013   1:24 PM, 11/05/22 Etta Grandchild, PT, DPT Physical Therapist - [place] [phone] (Office)   Sylvester C, PT 11/05/2022, 1:13 PM  [facility] [address] [place], [address] Phone: [phone]   Fax:  [phone]  Name: Kathryn Friedman MRN: [id number] Date of Birth: 30-Dec-1955

## 2022-11-06 ENCOUNTER — Telehealth: Payer: Self-pay | Admitting: Internal Medicine

## 2022-11-06 DIAGNOSIS — F17200 Nicotine dependence, unspecified, uncomplicated: Secondary | ICD-10-CM | POA: Diagnosis not present

## 2022-11-06 DIAGNOSIS — F32A Depression, unspecified: Secondary | ICD-10-CM | POA: Diagnosis not present

## 2022-11-06 DIAGNOSIS — J449 Chronic obstructive pulmonary disease, unspecified: Secondary | ICD-10-CM | POA: Diagnosis not present

## 2022-11-06 DIAGNOSIS — I10 Essential (primary) hypertension: Secondary | ICD-10-CM | POA: Diagnosis not present

## 2022-11-06 DIAGNOSIS — I872 Venous insufficiency (chronic) (peripheral): Secondary | ICD-10-CM | POA: Diagnosis not present

## 2022-11-06 DIAGNOSIS — I25119 Atherosclerotic heart disease of native coronary artery with unspecified angina pectoris: Secondary | ICD-10-CM | POA: Diagnosis not present

## 2022-11-06 NOTE — Telephone Encounter (Signed)
Would need an appt to discuss.

## 2022-11-06 NOTE — Telephone Encounter (Signed)
Patient called stating that her smoking coach at [facility] suggested that she start on a .5 mg of chantix to help her quite smoking. Patient asking what does Dr. Nicki Reaper suggest.

## 2022-11-07 ENCOUNTER — Ambulatory Visit: Payer: Medicare HMO | Admitting: Physical Therapy

## 2022-11-07 ENCOUNTER — Encounter: Payer: Self-pay | Admitting: Physical Therapy

## 2022-11-07 ENCOUNTER — Encounter: Payer: Medicare HMO | Admitting: Physical Therapy

## 2022-11-07 DIAGNOSIS — M542 Cervicalgia: Secondary | ICD-10-CM | POA: Diagnosis not present

## 2022-11-07 NOTE — Telephone Encounter (Signed)
Per [employer], pt will discuss w/ PCP at next appt

## 2022-11-07 NOTE — Telephone Encounter (Signed)
See my chart message.  Regarding medication - tylenol arthritis - 2 tablets bid.  If having persistent increased pain, would recommend f/u with ortho (since doing PT, etc).

## 2022-11-07 NOTE — Therapy (Signed)
OUTPATIENT PHYSICAL THERAPY TREATMENT NOTE   Patient Name: Kathryn Friedman MRN: [id number] DOB:January 31, 1956, 66 y.o., female Today's Date: 11/07/2022  PCP: Nicki Reaper MD REFERRING PROVIDER: Roland Rack MD  END OF SESSION:  PT End of Session - 11/07/22 1424     Visit Number 2    Number of Visits 16    Date for PT Re-Evaluation 12/31/22    Authorization Type [employer]; Medicaid secondary    Authorization Time Period 11/05/22-12/31/22    Authorization - Visit Number 2    Progress Note Due on Visit 10    PT Start Time 2993    PT Stop Time 1505    PT Time Calculation (min) 40 min    Activity Tolerance Patient tolerated treatment well;No increased pain;Patient limited by pain    Behavior During Therapy [facility] for tasks assessed/performed             Past Medical History:  Diagnosis Date   Anemia    Anginal pain (HCC)    Anxiety    Arthritis    back and knees   Asthma    Bilateral carotid artery stenosis    Blood in stool    Chronic diarrhea    COPD (chronic obstructive pulmonary disease) (HCC)    Coronary artery disease    a.) 75% pRCA; 3.5 x 28 mm Cypher DES placed on 07/24/2006   Current use of long term anticoagulation    Clopidogrel   Depression    secondary to the death of her husband (died 7)   Diverticulitis    Diverticulosis    Dizzinesses    Dysphagia    Dyspnea    Dysrhythmia    Fatty infiltration of liver    GERD (gastroesophageal reflux disease)    Headache    History of 2019 novel coronavirus disease (COVID-19) 12/09/2020   History of 2019 novel coronavirus disease (COVID-19) 12/20/2020   Hypertension    Hypertriglyceridemia    ILD (interstitial lung disease) ([place])    Lump in the abdomen    OSA on CPAP    Overactive bladder    PSVT (paroxysmal supraventricular tachycardia)    Spastic colon    T2DM (type 2 diabetes mellitus) ([place]) 05/2008   Tobacco abuse    Venous insufficiency of both lower extremities    Past Surgical History:  Procedure  Laterality Date   ABDOMINAL HYSTERECTOMY  with left ovary in place [place]   gallbladder and Appendix   BREAST BIOPSY Left 10/14/2017   calcs bx, fibrosis giant cell reaction and chronic inflammation, negative for malignancy.    CARPOMETACARPAL ([facility]) FUSION OF THUMB Right 07/10/2022   Procedure: CARPOMETACARPAL ([place]) SUSPENSION OF RIGHT THUMB;  Surgeon: Corky Mull, MD;  Location: [facility] ORS;  Service: Orthopedics;  Laterality: Right;   CATARACT EXTRACTION, BILATERAL     CESAREAN SECTION  1984   CHOLECYSTECTOMY  1985   COLONOSCOPY WITH PROPOFOL N/A 09/13/2016   Procedure: COLONOSCOPY WITH PROPOFOL;  Surgeon: Manya Silvas, MD;  Location: [facility] ENDOSCOPY;  Service: Endoscopy;  Laterality: N/A;   COLONOSCOPY WITH PROPOFOL N/A 11/09/2018   Procedure: COLONOSCOPY WITH PROPOFOL;  Surgeon: Manya Silvas, MD;  Location: [facility] ENDOSCOPY;  Service: Endoscopy;  Laterality: N/A;   COLONOSCOPY WITH PROPOFOL N/A 03/29/2020   Procedure: COLONOSCOPY WITH PROPOFOL;  Surgeon: Robert Bellow, MD;  Location: [facility] ENDOSCOPY;  Service: Endoscopy;  Laterality: N/A;   CORONARY ANGIOPLASTY WITH STENT PLACEMENT N/A 07/24/2006   75% pRCA; 3.5 x 28  mm Cypher DES placed; Location: [place]; Surgeons: Katrine Coho, MD   ESOPHAGOGASTRODUODENOSCOPY (EGD) WITH PROPOFOL N/A 02/02/2018   Procedure: ESOPHAGOGASTRODUODENOSCOPY (EGD) WITH PROPOFOL;  Surgeon: Manya Silvas, MD;  Location: [facility] ENDOSCOPY;  Service: Endoscopy;  Laterality: N/A;   ESOPHAGOGASTRODUODENOSCOPY (EGD) WITH PROPOFOL N/A 03/29/2020   Procedure: ESOPHAGOGASTRODUODENOSCOPY (EGD) WITH PROPOFOL;  Surgeon: Robert Bellow, MD;  Location: [facility] ENDOSCOPY;  Service: Endoscopy;  Laterality: N/A;   EYE SURGERY     JOINT REPLACEMENT     bilateral knee replacements   KNEE ARTHROSCOPY  Arthroscopic left knee surgery    KNEE SURGERY  status post knee surgey    LEFT HEART CATH AND CORONARY ANGIOGRAPHY Left 05/14/2018   Procedure: LEFT  HEART CATH AND CORONARY ANGIOGRAPHY;  Surgeon: Corey Skains, MD;  Location: [place] CV LAB;  Service: Cardiovascular;  Laterality: Left;   REPLACEMENT TOTAL KNEE  ([facility])   SHOULDER SURGERY  shoulder operation secondary to a torn tendon   TOTAL HIP ARTHROPLASTY Left 06/12/2021   Procedure: TOTAL HIP ARTHROPLASTY;  Surgeon: Corky Mull, MD;  Location: [facility] ORS;  Service: Orthopedics;  Laterality: Left;   Patient Active Problem List   Diagnosis Date Noted   Thumb pain, right 09/01/2022   Neck pain 09/01/2022   Tobacco dependence 08/14/2022   Primary osteoarthritis of first carpometacarpal joint of right hand 07/10/2022   Neck nodule 05/25/2022   Numbness of left hand 04/14/2022   Stented coronary artery 03/14/2022   Overactive bladder 03/14/2022   Obesity 03/14/2022   Restless leg syndrome 03/08/2022   Pre-op evaluation 03/06/2022   Closed wedge compression fracture of T4 vertebra ([place]) 02/08/2022   Bereavement 01/09/2022   Lumbar spondylosis 07/27/2021   Close exposure to COVID-19 virus 07/15/2021   Electrolyte disorder 07/01/2021   Closed nondisplaced fracture of greater trochanter of left femur ([place]) 06/22/2021   Hip fx ([place]) 06/19/2021   Status post total hip replacement, left 06/12/2021   Paroxysmal A-fib ([place]) 06/07/2021   Primary osteoarthritis of left hip 05/18/2021   MDD (major depressive disorder), recurrent episode, moderate ([place]) 04/05/2021   Aortic atherosclerosis ([place]) 04/03/2021   SOBOE (shortness of breath on exertion) 01/16/2021   Thrush 12/27/2020   Suspected COVID-19 virus infection 12/20/2020   MDD (major depressive disorder), recurrent episode, mild ([place]) 11/16/2020   Skin lesion 07/23/2020   MDD (major depressive disorder), recurrent, in full remission ([place]) 06/26/2020   Insomnia due to medical condition 06/26/2020   Anxiety disorder 06/26/2020   Cough 04/03/2020   Urge incontinence 03/02/2020   Urinary frequency 02/06/2020   Carotid artery  disease ([place]) 10/15/2019   PSVT (paroxysmal supraventricular tachycardia) 08/09/2019   History of migraine headaches 05/10/2019   Lower abdominal pain 03/21/2019   Dysphagia 09/15/2018   ILD (interstitial lung disease) ([place]) 08/17/2017   Tobacco use disorder 08/11/2016   Venous insufficiency of both lower extremities 07/10/2016   Healthcare maintenance 06/18/2016   Lump in the abdomen 08/06/2015   Dizziness 08/06/2015   Fatty infiltration of liver 08/11/2014   Blood in the stool 08/11/2014   Acute diarrhea 08/11/2014   Hemorrhage of gastrointestinal tract 08/07/2014   GERD (gastroesophageal reflux disease) 10/20/2013   Nausea with vomiting 10/19/2013   LUQ pain 10/19/2013   Diarrhea 09/26/2013   CAD (coronary artery disease) 02/18/2013   COPD (chronic obstructive pulmonary disease) ([place]) 02/18/2013   Obstructive sleep apnea 02/18/2013   Diverticulosis 02/18/2013   Diabetes ([place]) 02/17/2013   Essential hypertension, benign 02/17/2013   Hypercholesterolemia 02/17/2013  REFERRING DIAG: cervicalgia  THERAPY DIAG:  Cervicalgia  Rationale for Evaluation and Treatment Rehabilitation  PERTINENT HISTORY: Cherrell Maybee is a 60yoF who is referred to OPPT by orthoipedics for constant neck pain since September, insidious onset. Imaging revealing of cervical spondylosis, pt followed by OA in multiple areas. Pain is associated with end of resitrated range mostly but not exclusively left sided closing phenomenon. Pt takes arthriits tylenol every day, but reports no affect on he rneck. She denies any paresthesias, loss of strength. Limitations in sitting, standing, walking.   PRECAUTIONS: none  SUBJECTIVE:   SUBJECTIVE STATEMENT: Pt reports 7/10 L sided neck pain today. That she is very tired today from not sleeping well with her puppy. Pt reports some compliance with HEP, but that "it's not helping"  PAIN:  Are you having pain? Yes: NPRS scale: 7/10 Pain location: Mostly L UT, pain on  R side as well Pain description: nagging, aching Aggravating factors: the morning, any neck movement, lifting, overhead motion and lifting Relieving factors: none- has tried several analgesics    TODAY'S TREATMENT:                                                                                                                                         DATE: 11/07/22  Manual Distraction x10 10sec on/10sec off Lateral bending with glide Rotation with unilateral mobilization  STM with trigger point release to bilat UT and suboccipitals Thoracic ext stretch in prone x6 2sec hold  Ther-Ex Chin tuck 5x 5sec with cuing for initial set up technique with good carry over Active L cervical rotation (following manual techniques R rotation full nd painless) with C0-1 L UPA with movement x12 with painless rotation following Bilat ER RTB against wall for TC posture cuing 2x 12 (attempted GTB initially) Prone T 2x 12 with good carry over of cuing for proper technique of therex  Prone hands behind head elbow + chest lift x12 Supine snow angels with towel roll along spine x15 Post shoulder rolls x15  Clinical Impression: PT initiated therex progression for increased cervico-thoracic mobility and periscapular strengthening with success. Patient is able to comply with all cuing for proper technique of therex with good effort throughout session. Patient reports decreased pain at end of session by 2 points on NPRS. PT will continue progression as able.    PATIENT EDUCATION: Education details: Patient was educated on diagnosis, anatomy and pathology involved, prognosis, role of PT, and was given an HEP, demonstrating exercise with proper form following verbal and tactile cues, and was given a paper hand out to continue exercise at home. Pt was educated on and agreed to plan of care.  Person educated: Patient Education method: Explanation, Demonstration, and Verbal cues Education comprehension: verbalized  understanding, returned demonstration, and verbal cues required  HOME EXERCISE PROGRAM: -Supine cervical rotation A/ROM 1x20 bilat, cues to stay within tolerated range -Supine cervical retraction into  2 pillows, slightly flexed 10x3sec H  -Supine cervical chin tuck and low lift 10x3secH, 2 sets  -Supine mid thoracic extension P/ROM stretch x2 minutes    PT Short Term Goals - 11/05/22 1307       PT SHORT TERM GOAL #1   Title Pt to reports compliance with HEP and favorable affect on pain/symptoms.    Time 4    Period Weeks    Status New    Target Date 12/03/22      PT SHORT TERM GOAL #2   Title Pt to demonstrate improved cervical spine A/ROM >6 degrees to improve ease/safety in driving.    Baseline 11/05/22:Rot Left 40, Rt 56    Time 4    Period Weeks    Status New    Target Date 12/03/22              PT Long Term Goals - 11/05/22 1309       PT LONG TERM GOAL #1   Title Pt to demonstrate improved DNF endurace test >20sec to improve motor control in cervical spine with head movements.    Baseline 11/05/22: <5sec    Time 8    Period Weeks    Status New    Target Date 12/31/22      PT LONG TERM GOAL #2   Title Pt to improve FOTO score >12 points to indiciated improved facility in performance of ADL mobility.    Baseline 11/05/22: 30    Time 8    Period Weeks    Status New    Target Date 01/28/23      PT LONG TERM GOAL #3   Title Pt to demonstrate improved cervical extension ROM >32 degrees to improve ability to perform overhead activities in kitchen.    Baseline 11/05/22: 28 degrees    Time 8    Period Weeks    Status New    Target Date 12/31/22               Durwin Reges DPT  Durwin Reges, PT 11/07/2022, 3:06 PM

## 2022-11-11 ENCOUNTER — Encounter: Payer: Medicare HMO | Admitting: Physical Therapy

## 2022-11-11 NOTE — Progress Notes (Deleted)
Cardiology Office Note  Date:  11/11/2022   ID:  [name], DOB [birth year], MRN [id number]  PCP:  Kathryn Pheasant, MD   No chief complaint on file.   HPI:  [name] is a 66 year old woman with past medical history of Depression Coronary artery disease catheterization June 2019 moderate to severe proximal and mid RCA disease occluded distal RCA with collaterals left to right mild LAD and circumflex disease Stent x2 2007 Smoking history/COPD Hyperlipidemia Who presents for f/u of her coronary disease, paroxysmal atrial fibrillation  LOV 6/23 Seen by one of our providers for preop evaluation November 2023 Scheduled for hip replacement  Zio monitor reviewed Frequent episodes of atrial fibrillation noted, 3% burden 151 Supraventricular Tachycardia runs occurred, the run with the fastest interval lasting 5 beats with a max rate of 179 bpm, the  longest lasting 21.4 secs with an avg rate of 101 bpm.   We had recommended she increase propranolol from 60 mg once a day up to 60 mg ER twice a day  Still smoking 2 ppd No further near syncope or syncope She does have periodic dizziness, unclear etiology  Reports that she has COPD, not on oxygen Trying to quit  Chronic SOB Uses a cane Wheelchair today Uses medical alert  BP still "running high" 130s to 150s Heart rate 70 to 80s Numbers reviewed from home on today's visit  Lab work reviewed A1C 7.5 Total chol 134, LDL 62  EKG personally reviewed by myself on todays visit Normal sinus rhythm rate 69 bpm no significant ST-T wave changes  Note indicating history of DVT Unclear history paroxysmal atrial fibrillation, on Eliquis  Other past medical history reviewed  episode of near syncope in shower, discussed March 2023 "Woke up on the ground" in the shower but reports never losing consciousness Minimal warning "Could it be atrial fibrillation" Has not had any further episodes  Husband died, fall, On  hospice, 2021-12-24  Carotid u/s  07/2020 1. Minimal atherosclerotic plaque bilaterally resulting in less than 50% stenosis of the bilateral ICAs. 2. Antegrade flow is noted within both vertebral arteries.  Echo NORMAL LEFT VENTRICULAR SYSTOLIC FUNCTION  NORMAL RIGHT VENTRICULAR SYSTOLIC FUNCTION  MILD VALVULAR REGURGITATION (See above)  NO VALVULAR STENOSIS  MILD MR, TR  EF 55%   Underwent hip replacement Following surgery developed acute problem with a hip Seen in the ER after hearing a popping sound followed by right leg/hip pain.  Found to have avulsion fracture of greater trochanter left hip DVT prophylaxis following procedure Eliquis 2.5 twice daily Was started on Eliquis 2.5 twice daily by Dr. Roland Rack at discharge June 13, 2021    PMH:   has a past medical history of Anemia, Anginal pain ([place]), Anxiety, Arthritis, Asthma, Bilateral carotid artery stenosis, Blood in stool, Chronic diarrhea, COPD (chronic obstructive pulmonary disease) ([place]), Coronary artery disease, Current use of long term anticoagulation, Depression, Diverticulitis, Diverticulosis, Dizzinesses, Dysphagia, Dyspnea, Dysrhythmia, Fatty infiltration of liver, GERD (gastroesophageal reflux disease), Headache, History of 2019 novel coronavirus disease (COVID-19) (12/09/2020), History of 2019 novel coronavirus disease (COVID-19) (12/20/2020), Hypertension, Hypertriglyceridemia, ILD (interstitial lung disease) ([place]), Lump in the abdomen, OSA on CPAP, Overactive bladder, PSVT (paroxysmal supraventricular tachycardia), Spastic colon, T2DM (type 2 diabetes mellitus) ([place]) (05/2008), Tobacco abuse, and Venous insufficiency of both lower extremities.  PSH:    Past Surgical History:  Procedure Laterality Date   ABDOMINAL HYSTERECTOMY  with left ovary in place [place]   gallbladder  and Appendix   BREAST BIOPSY Left 10/14/2017   calcs bx, fibrosis giant cell reaction and chronic inflammation, negative for  malignancy.    CARPOMETACARPAL ([facility]) FUSION OF THUMB Right 07/10/2022   Procedure: CARPOMETACARPAL ([place]) SUSPENSION OF RIGHT THUMB;  Surgeon: Corky Mull, MD;  Location: [facility] ORS;  Service: Orthopedics;  Laterality: Right;   CATARACT EXTRACTION, BILATERAL     CESAREAN SECTION  1984   CHOLECYSTECTOMY  1985   COLONOSCOPY WITH PROPOFOL N/A 09/13/2016   Procedure: COLONOSCOPY WITH PROPOFOL;  Surgeon: Manya Silvas, MD;  Location: [facility] ENDOSCOPY;  Service: Endoscopy;  Laterality: N/A;   COLONOSCOPY WITH PROPOFOL N/A 11/09/2018   Procedure: COLONOSCOPY WITH PROPOFOL;  Surgeon: Manya Silvas, MD;  Location[facility] ENDOSCOPY;  Service: Endoscopy;  Laterality: N/A;   COLONOSCOPY WITH PROPOFOL N/A 03/29/2020   Procedure: COLONOSCOPY WITH PROPOFOL;  Surgeon: Robert Bellow, MD;  Location: [facility] ENDOSCOPY;  Service: Endoscopy;  Laterality: N/A;   CORONARY ANGIOPLASTY WITH STENT PLACEMENT N/A 07/24/2006   75% pRCA; 3.5 x 28 mm Cypher DES placed; Location: [place]; Surgeons: Katrine Coho, MD   ESOPHAGOGASTRODUODENOSCOPY (EGD) WITH PROPOFOL N/A 02/02/2018   Procedure: ESOPHAGOGASTRODUODENOSCOPY (EGD) WITH PROPOFOL;  Surgeon: Manya Silvas, MD;  Location: [facility] ENDOSCOPY;  Service: Endoscopy;  Laterality: N/A;   ESOPHAGOGASTRODUODENOSCOPY (EGD) WITH PROPOFOL N/A 03/29/2020   Procedure: ESOPHAGOGASTRODUODENOSCOPY (EGD) WITH PROPOFOL;  Surgeon: Robert Bellow, MD;  Location: [facility] ENDOSCOPY;  Service: Endoscopy;  Laterality: N/A;   EYE SURGERY     JOINT REPLACEMENT     bilateral knee replacements   KNEE ARTHROSCOPY  Arthroscopic left knee surgery    KNEE SURGERY  status post knee surgey    LEFT HEART CATH AND CORONARY ANGIOGRAPHY Left 05/14/2018   Procedure: LEFT HEART CATH AND CORONARY ANGIOGRAPHY;  Surgeon: Corey Skains, MD;  Location: [place] CV LAB;  Service: Cardiovascular;  Laterality: Left;   REPLACEMENT TOTAL KNEE  ([facility])   SHOULDER SURGERY  shoulder operation secondary to a  torn tendon   TOTAL HIP ARTHROPLASTY Left 06/12/2021   Procedure: TOTAL HIP ARTHROPLASTY;  Surgeon: Corky Mull, MD;  Location: [facility] ORS;  Service: Orthopedics;  Laterality: Left;    Current Outpatient Medications  Medication Sig Dispense Refill   Accu-Chek Softclix Lancets lancets Use as instructed 100 each 12   acetaminophen (TYLENOL) 325 MG tablet Take 650 mg by mouth every 6 (six) hours as needed.     albuterol (VENTOLIN HFA) 108 (90 Base) MCG/ACT inhaler INHALE 2 PUFFS FOUR TIMES A DAY 8.5 g 11   amLODipine (NORVASC) 10 MG tablet TAKE 1 TABLET EVERY DAY 90 tablet 0   Blood Glucose Monitoring Suppl (TRUE METRIX METER) w/Device KIT Use as directed twice a day to check sugars 1 kit 0   Budeson-Glycopyrrol-Formoterol (BREZTRI AEROSPHERE) 160-9-4.8 MCG/ACT AERO Inhale 2 puffs into the lungs in the morning and at bedtime. 32.1 g 3   CALCIUM PO Take 600 mg by mouth daily.      cholecalciferol (VITAMIN D3) 25 MCG (1000 UNIT) tablet Take 1,000 Units by mouth daily.     Cyanocobalamin (VITAMIN B-12 PO) Take 2,500 mcg by mouth daily.     DULoxetine (CYMBALTA) 60 MG capsule TAKE 1 CAPSULE EVERY DAY 90 capsule 1   ELIQUIS 5 MG TABS tablet TAKE 1 TABLET TWICE DAILY 180 tablet 0   famotidine (PEPCID) 40 MG tablet TAKE 1 TABLET EVERY DAY 90 tablet 1   HM MELATONIN PO 5 mg by Other route at bedtime  as needed. patch     isosorbide mononitrate (IMDUR) 30 MG 24 hr tablet TAKE 1 TABLET EVERY DAY 90 tablet 0   Multiple Vitamin (MULTIVITAMIN) tablet Take 1 tablet by mouth daily.     mupirocin ointment (BACTROBAN) 2 % Apply 1 application. topically 2 (two) times daily. 22 g 0   nystatin (MYCOSTATIN/NYSTOP) powder APPLY TO THE AFFECTED AREA(S) TWICE DAILY 30 g 0   pantoprazole (PROTONIX) 40 MG tablet TAKE 1 TABLET TWICE DAILY BEFORE MEALS 180 tablet 1   propranolol ER (INDERAL LA) 80 MG 24 hr capsule TAKE 1 CAPSULE TWICE DAILY 180 capsule 0   rOPINIRole (REQUIP) 0.5 MG tablet TAKE 1 TABLET (0.5 MG TOTAL) BY  MOUTH DAILY AFTER LUNCH. 90 tablet 0   rOPINIRole (REQUIP) 3 MG tablet Take 1 tablet (3 mg total) by mouth at bedtime. Take along with 0.5 mg daily 60 tablet 2   rosuvastatin (CRESTOR) 20 MG tablet TAKE 1 TABLET EVERY DAY 90 tablet 10   Semaglutide,0.25 or 0.5MG/DOS, (OZEMPIC, 0.25 OR 0.5 MG/DOSE,) 2 MG/1.5ML SOPN Inject 0.25 mg weekly for 4 weeks then increase to 0.5 mg weekly (Patient taking differently: Inject 0.5 mg into the skin once a week. Inject 0.25 mg weekly for 4 weeks then increase to 0.5 mg weekly) 1.5 mL 2   telmisartan (MICARDIS) 80 MG tablet TAKE 1 TABLET EVERY DAY 90 tablet 1   tiZANidine (ZANAFLEX) 2 MG tablet Take 2 mg by mouth 3 (three) times daily as needed.     traMADol (ULTRAM) 50 MG tablet Take by mouth.     traZODone (DESYREL) 100 MG tablet TAKE 1 TABLET (100 MG TOTAL) BY MOUTH AT BEDTIME. STOP SEROQUEL 90 tablet 0   trimethoprim (TRIMPEX) 100 MG tablet Take 1 tablet (100 mg total) by mouth daily. 90 tablet 3   TRUE METRIX BLOOD GLUCOSE TEST test strip USE AS INSTRUCTED TO CHECK BLOOD SUGARS TWICE DAILY. 200 strip 10   Vibegron (GEMTESA) 75 MG TABS Take 75 mg by mouth daily. 90 tablet 3   No current facility-administered medications for this visit.     Allergies:   Varenicline, Varenicline tartrate, Jardiance [empagliflozin], Metformin and related, and Methylprednisolone   Social History:  The patient  reports that she has been smoking cigarettes. She has a 90.00 pack-year smoking history. She has been exposed to tobacco smoke. She has never used smokeless tobacco. She reports that she does not drink alcohol and does not use drugs.   Family History:   family history includes Bipolar disorder in her sister; Cirrhosis in her brother; Colon cancer in her paternal aunt; Depression in her mother; Heart disease in her father; Hepatitis C in her brother; Lung cancer in her mother; Mitral valve prolapse in her mother and sister; Other in her mother.    Review of  Systems: Review of Systems  Constitutional: Negative.   HENT: Negative.    Respiratory:  Positive for shortness of breath.   Cardiovascular:  Positive for palpitations.  Gastrointestinal: Negative.   Musculoskeletal: Negative.   Neurological: Negative.   Psychiatric/Behavioral: Negative.    All other systems reviewed and are negative.   PHYSICAL EXAM: VS:  There were no vitals taken for this visit. , BMI There is no height or weight on file to calculate BMI. Constitutional:  oriented to person, place, and time. No distress.  HENT:  Head: Grossly normal Eyes:  no discharge. No scleral icterus.  Neck: No JVD, no carotid bruits  Cardiovascular: Regular rate and rhythm, no  murmurs appreciated Pulmonary/Chest: Clear to auscultation bilaterally, no wheezes or rails Abdominal: Soft.  no distension.  no tenderness.  Musculoskeletal: Normal range of motion Neurological:  normal muscle tone. Coordination normal. No atrophy Skin: Skin warm and dry Psychiatric: normal affect, pleasant  Recent Labs: 06/17/2022: ALT 11; BUN 9; Creatinine, Ser 0.77; Potassium 4.1; Sodium 136; TSH 1.23 07/01/2022: Hemoglobin 14.5; Platelets 176.0    Lipid Panel Lab Results  Component Value Date   CHOL 114 06/17/2022   HDL 49.70 06/17/2022   LDLCALC 46 06/17/2022   TRIG 94.0 06/17/2022      Wt Readings from Last 3 Encounters:  10/21/22 207 lb (93.9 kg)  10/11/22 206 lb 9.6 oz (93.7 kg)  10/04/22 206 lb 9.6 oz (93.7 kg)     ASSESSMENT AND PLAN:  Problem List Items Addressed This Visit   None Near syncope Etiology unclear, likely secondary to arrhythmia Zio monitor showing 3% atrial fibrillation as well as short runs of SVT up to 20 seconds continue Eliquis 5 twice daily Blood pressure elevated and heart rate 70 to 80 bpm Recommend she increase propranolol up to 80 twice daily of the extended release  PAF NSR today On eliquis Zio monitor 3% atrial fib, episodes of SVT We will increase  propranolol as above extended release 80 twice daily  Smoker We have encouraged her to continue to work on weaning her cigarettes and smoking cessation. She will continue to work on this and does not want any assistance with chantix.    Coronary disease with stable angina Currently with no symptoms of angina. No further workup at this time. Continue current medication regimen.   Total encounter time more than 30 minutes  Greater than 50% was spent in counseling and coordination of care with the patient    Signed, Esmond Plants, M.D., Ph.D. [place], [place]

## 2022-11-12 ENCOUNTER — Ambulatory Visit: Payer: Medicare HMO | Admitting: Cardiovascular Disease

## 2022-11-12 ENCOUNTER — Ambulatory Visit: Payer: Medicare HMO | Admitting: Physical Therapy

## 2022-11-14 ENCOUNTER — Encounter: Payer: Self-pay | Admitting: Physical Therapy

## 2022-11-14 ENCOUNTER — Ambulatory Visit: Payer: Medicare HMO | Admitting: Physical Therapy

## 2022-11-14 ENCOUNTER — Encounter: Payer: Medicare HMO | Admitting: Physical Therapy

## 2022-11-14 DIAGNOSIS — M542 Cervicalgia: Secondary | ICD-10-CM

## 2022-11-14 NOTE — Therapy (Signed)
OUTPATIENT PHYSICAL THERAPY TREATMENT NOTE   Patient Name: Kathryn Friedman MRN: [id number] DOB:[birth year], 66 y.o., female Today's Date: 11/14/2022  PCP: Nicki Reaper MD REFERRING PROVIDER: Poggi MD  END OF SESSION:  PT End of Session - 11/14/22 1039     Visit Number 3    Number of Visits 16    Date for PT Re-Evaluation 12/31/22    Authorization Type [employer]; Medicaid secondary    Authorization - Visit Number 3    Progress Note Due on Visit 10    PT Start Time 1000    PT Stop Time 1042    PT Time Calculation (min) 42 min    Activity Tolerance Patient tolerated treatment well;No increased pain;Patient limited by pain    Behavior During Therapy [facility] for tasks assessed/performed              Past Medical History:  Diagnosis Date   Anemia    Anginal pain (HCC)    Anxiety    Arthritis    back and knees   Asthma    Bilateral carotid artery stenosis    Blood in stool    Chronic diarrhea    COPD (chronic obstructive pulmonary disease) (HCC)    Coronary artery disease    a.) 75% pRCA; 3.5 x 28 mm Cypher DES placed on 07/24/2006   Current use of long term anticoagulation    Clopidogrel   Depression    secondary to the death of her husband (died 12)   Diverticulitis    Diverticulosis    Dizzinesses    Dysphagia    Dyspnea    Dysrhythmia    Fatty infiltration of liver    GERD (gastroesophageal reflux disease)    Headache    History of 2019 novel coronavirus disease (COVID-19) 12/09/2020   History of 2019 novel coronavirus disease (COVID-19) 12/20/2020   Hypertension    Hypertriglyceridemia    ILD (interstitial lung disease) ([place])    Lump in the abdomen    OSA on CPAP    Overactive bladder    PSVT (paroxysmal supraventricular tachycardia)    Spastic colon    T2DM (type 2 diabetes mellitus) ([place]) 05/2008   Tobacco abuse    Venous insufficiency of both lower extremities    Past Surgical History:  Procedure Laterality Date   ABDOMINAL HYSTERECTOMY  with  left ovary in place [place]   gallbladder and Appendix   BREAST BIOPSY Left 10/14/2017   calcs bx, fibrosis giant cell reaction and chronic inflammation, negative for malignancy.    CARPOMETACARPAL ([facility]) FUSION OF THUMB Right 07/10/2022   Procedure: CARPOMETACARPAL ([place]) SUSPENSION OF RIGHT THUMB;  Surgeon: Corky Mull, MD;  Location: [facility] ORS;  Service: Orthopedics;  Laterality: Right;   CATARACT EXTRACTION, BILATERAL     CESAREAN SECTION  1984   CHOLECYSTECTOMY  1985   COLONOSCOPY WITH PROPOFOL N/A 09/13/2016   Procedure: COLONOSCOPY WITH PROPOFOL;  Surgeon: Manya Silvas, MD;  Location[facility];  Service: Endoscopy;  Laterality: N/A;   COLONOSCOPY WITH PROPOFOL N/A 11/09/2018   Procedure: COLONOSCOPY WITH PROPOFOL;  Surgeon: Manya Silvas, MD;  Location: [facility] ENDOSCOPY;  Service: Endoscopy;  Laterality: N/A;   COLONOSCOPY WITH PROPOFOL N/A 03/29/2020   Procedure: COLONOSCOPY WITH PROPOFOL;  Surgeon: Robert Bellow, MD;  Location: [facility] ENDOSCOPY;  Service: Endoscopy;  Laterality: N/A;   CORONARY ANGIOPLASTY WITH STENT PLACEMENT N/A 07/24/2006   75% pRCA; 3.5 x 28 mm Cypher DES placed; Location: [place];  Surgeons: Katrine Coho, MD   ESOPHAGOGASTRODUODENOSCOPY (EGD) WITH PROPOFOL N/A 02/02/2018   Procedure: ESOPHAGOGASTRODUODENOSCOPY (EGD) WITH PROPOFOL;  Surgeon: Manya Silvas, MD;  Location[facility] ENDOSCOPY;  Service: Endoscopy;  Laterality: N/A;   ESOPHAGOGASTRODUODENOSCOPY (EGD) WITH PROPOFOL N/A 03/29/2020   Procedure: ESOPHAGOGASTRODUODENOSCOPY (EGD) WITH PROPOFOL;  Surgeon: Robert Bellow, MD;  Location: [facility] ENDOSCOPY;  Service: Endoscopy;  Laterality: N/A;   EYE SURGERY     JOINT REPLACEMENT     bilateral knee replacements   KNEE ARTHROSCOPY  Arthroscopic left knee surgery    KNEE SURGERY  status post knee surgey    LEFT HEART CATH AND CORONARY ANGIOGRAPHY Left 05/14/2018   Procedure: LEFT HEART CATH AND CORONARY ANGIOGRAPHY;  Surgeon:  Corey Skains, MD;  Location: [place] [facility];  Service: Cardiovascular;  Laterality: Left;   REPLACEMENT TOTAL KNEE  ([facility])   SHOULDER SURGERY  shoulder operation secondary to a torn tendon   TOTAL HIP ARTHROPLASTY Left 06/12/2021   Procedure: TOTAL HIP ARTHROPLASTY;  Surgeon: Corky Mull, MD;  Location: [facility] ORS;  Service: Orthopedics;  Laterality: Left;   Patient Active Problem List   Diagnosis Date Noted   Thumb pain, right 09/01/2022   Neck pain 09/01/2022   Tobacco dependence 08/14/2022   Primary osteoarthritis of first carpometacarpal joint of right hand 07/10/2022   Neck nodule 05/25/2022   Numbness of left hand 04/14/2022   Stented coronary artery 03/14/2022   Overactive bladder 03/14/2022   Obesity 03/14/2022   Restless leg syndrome 03/08/2022   Pre-op evaluation 03/06/2022   Closed wedge compression fracture of T4 vertebra ([place]) 02/08/2022   Bereavement 01/09/2022   Lumbar spondylosis 07/27/2021   Close exposure to COVID-19 virus 07/15/2021   Electrolyte disorder 07/01/2021   Closed nondisplaced fracture of greater trochanter of left femur ([place]) 06/22/2021   Hip fx ([place]) 06/19/2021   Status post total hip replacement, left 06/12/2021   Paroxysmal A-fib ([place]) 06/07/2021   Primary osteoarthritis of left hip 05/18/2021   MDD (major depressive disorder), recurrent episode, moderate ([place]) 04/05/2021   Aortic atherosclerosis ([place]) 04/03/2021   SOBOE (shortness of breath on exertion) 01/16/2021   Thrush 12/27/2020   Suspected COVID-19 virus infection 12/20/2020   MDD (major depressive disorder), recurrent episode, mild ([place]) 11/16/2020   Skin lesion 07/23/2020   MDD (major depressive disorder), recurrent, in full remission ([place]) 06/26/2020   Insomnia due to medical condition 06/26/2020   Anxiety disorder 06/26/2020   Cough 04/03/2020   Urge incontinence 03/02/2020   Urinary frequency 02/06/2020   Carotid artery disease ([place]) 10/15/2019   PSVT (paroxysmal  supraventricular tachycardia) 08/09/2019   History of migraine headaches 05/10/2019   Lower abdominal pain 03/21/2019   Dysphagia 09/15/2018   ILD (interstitial lung disease) ([place]) 08/17/2017   Tobacco use disorder 08/11/2016   Venous insufficiency of both lower extremities 07/10/2016   Healthcare maintenance 06/18/2016   Lump in the abdomen 08/06/2015   Dizziness 08/06/2015   Fatty infiltration of liver 08/11/2014   Blood in the stool 08/11/2014   Acute diarrhea 08/11/2014   Hemorrhage of gastrointestinal tract 08/07/2014   GERD (gastroesophageal reflux disease) 10/20/2013   Nausea with vomiting 10/19/2013   LUQ pain 10/19/2013   Diarrhea 09/26/2013   CAD (coronary artery disease) 02/18/2013   COPD (chronic obstructive pulmonary disease) ([place]) 02/18/2013   Obstructive sleep apnea 02/18/2013   Diverticulosis 02/18/2013   Diabetes ([place]) 02/17/2013   Essential hypertension, benign 02/17/2013   Hypercholesterolemia 02/17/2013    REFERRING DIAG: cervicalgia  THERAPY DIAG:  Cervicalgia  Rationale for Evaluation and Treatment Rehabilitation  PERTINENT HISTORY: Janece Laidlaw is a 60yoF who is referred to OPPT by orthoipedics for constant neck pain since September, insidious onset. Imaging revealing of cervical spondylosis, pt followed by OA in multiple areas. Pain is associated with end of resitrated range mostly but not exclusively left sided closing phenomenon. Pt takes arthriits tylenol every day, but reports no affect on he rneck. She denies any paresthesias, loss of strength. Limitations in sitting, standing, walking.   PRECAUTIONS: none  SUBJECTIVE:   SUBJECTIVE STATEMENT: Pt reports she felt good after last session, still feeling a little bit better, pain 7/10 on L side of the neck. Has been having L sided tension HA. Has been making a "conscious effort to stand up straighter".  PAIN:  Are you having pain? Yes: NPRS scale: 7/10 Pain location: Mostly L UT, pain on R side  as well Pain description: nagging, aching Aggravating factors: the morning, any neck movement, lifting, overhead motion and lifting Relieving factors: none- has tried several analgesics    TODAY'S TREATMENT:                                                                                                                                         DATE: 12/121/23  Manual Distraction x10 10sec on/10sec off Lateral bending with glide Rotation with unilateral mobilization  STM with trigger point release to bilat UT and suboccipitals; L>R Thoracic ext stretch in prone x6 2sec hold  Ther-Ex Chin tuck + rotation in supine x12 Active L cervical rotation (following manual techniques R rotation full nd painless) with C0-1 L UPA with movement x12 with painless rotation following [place] on the wall x12 with cuing for cervical position  Thoracic ext over 1/2 foam with hands behind head for pec stretch x12; with overhead dowel x12 with concordant cervical motion Thoracic rotation with overhead shoulder reach + ball at hip agaist wall + concordant cervical rotation x12 each direction; cuing for breath control  Good morning with overhead dowel x12 Shrug 10# DB bilat x12 UT stretch x30sec bilat Post shoulder rolls x15  Clinical Impression: PT continued therex progression for increased cervico-thoracic mobility and periscapular/thoracic ext strengthening with success. Patient is able to comply with all cuing for proper technique of therex with good effort throughout session. Patient reports decreased pain at end of session by 2 points on NPRS to 5/10. PT will continue progression as able.    PATIENT EDUCATION: Education details: Patient was educated on diagnosis, anatomy and pathology involved, prognosis, role of PT, and was given an HEP, demonstrating exercise with proper form following verbal and tactile cues, and was given a paper hand out to continue exercise at home. Pt was educated on and agreed  to plan of care.  Person educated: Patient Education method: Explanation, Demonstration, and Verbal cues Education comprehension: verbalized understanding, returned demonstration, and  verbal cues required  HOME EXERCISE PROGRAM: -Supine cervical rotation A/ROM 1x20 bilat, cues to stay within tolerated range -Supine cervical retraction into 2 pillows, slightly flexed 10x3sec H  -Supine cervical chin tuck and low lift 10x3secH, 2 sets  -Supine mid thoracic extension P/ROM stretch x2 minutes    PT Short Term Goals - 11/05/22 1307       PT SHORT TERM GOAL #1   Title Pt to reports compliance with HEP and favorable affect on pain/symptoms.    Time 4    Period Weeks    Status New    Target Date 12/03/22      PT SHORT TERM GOAL #2   Title Pt to demonstrate improved cervical spine A/ROM >6 degrees to improve ease/safety in driving.    Baseline 11/05/22:Rot Left 40, Rt 56    Time 4    Period Weeks    Status New    Target Date 12/03/22              PT Long Term Goals - 11/05/22 1309       PT LONG TERM GOAL #1   Title Pt to demonstrate improved DNF endurace test >20sec to improve motor control in cervical spine with head movements.    Baseline 11/05/22: <5sec    Time 8    Period Weeks    Status New    Target Date 12/31/22      PT LONG TERM GOAL #2   Title Pt to improve FOTO score >12 points to indiciated improved facility in performance of ADL mobility.    Baseline 11/05/22: 30    Time 8    Period Weeks    Status New    Target Date 01/28/23      PT LONG TERM GOAL #3   Title Pt to demonstrate improved cervical extension ROM >32 degrees to improve ability to perform overhead activities in kitchen.    Baseline 11/05/22: 28 degrees    Time 8    Period Weeks    Status New    Target Date 12/31/22               Durwin Reges DPT  Durwin Reges, PT 11/14/2022, 10:42 AM

## 2022-11-16 ENCOUNTER — Other Ambulatory Visit: Payer: Self-pay | Admitting: Internal Medicine

## 2022-11-16 ENCOUNTER — Other Ambulatory Visit: Payer: Self-pay | Admitting: Cardiovascular Disease

## 2022-11-16 DIAGNOSIS — I480 Paroxysmal atrial fibrillation: Secondary | ICD-10-CM

## 2022-11-19 ENCOUNTER — Encounter: Payer: Self-pay | Admitting: Internal Medicine

## 2022-11-19 ENCOUNTER — Other Ambulatory Visit: Payer: Self-pay

## 2022-11-19 ENCOUNTER — Encounter: Payer: Medicare HMO | Admitting: Physical Therapy

## 2022-11-19 ENCOUNTER — Ambulatory Visit: Payer: Medicare HMO | Admitting: Physical Therapy

## 2022-11-19 ENCOUNTER — Telehealth: Payer: Self-pay | Admitting: Internal Medicine

## 2022-11-19 DIAGNOSIS — I480 Paroxysmal atrial fibrillation: Secondary | ICD-10-CM

## 2022-11-19 MED ORDER — PANTOPRAZOLE SODIUM 40 MG PO TBEC: DELAYED_RELEASE_TABLET | ORAL | 1 refills | Status: DC

## 2022-11-19 MED ORDER — APIXABAN 5 MG PO TABS: 5.0000 mg | ORAL_TABLET | Freq: Two times a day (BID) | ORAL | 3 refills | Status: DC

## 2022-11-19 MED ORDER — FAMOTIDINE 40 MG PO TABS: 40.0000 mg | ORAL_TABLET | Freq: Every day | ORAL | 3 refills | Status: DC

## 2022-11-19 NOTE — Telephone Encounter (Signed)
sent 

## 2022-11-19 NOTE — Telephone Encounter (Signed)
Pt need refill on ELIQUIS, famotidine, pantoprazole sent to [facility]

## 2022-11-20 ENCOUNTER — Encounter: Payer: Self-pay | Admitting: Urology

## 2022-11-20 ENCOUNTER — Other Ambulatory Visit: Payer: Self-pay | Admitting: Internal Medicine

## 2022-11-20 ENCOUNTER — Ambulatory Visit: Payer: Medicare HMO | Admitting: Physician Assistant

## 2022-11-21 ENCOUNTER — Ambulatory Visit: Payer: Medicare HMO | Admitting: Physical Therapy

## 2022-11-21 ENCOUNTER — Encounter: Payer: Medicare HMO | Admitting: Physical Therapy

## 2022-11-22 NOTE — Telephone Encounter (Signed)
Reviewed her message.  Please inform her that I am not in the office this week. I Have already instructed her to continue to monitor her blood pressure.  She mentioned afib.  Confirm doing ok.  Has appt with Dr Rockey Situ 12/24/22 - but let her know if any problems or persistent issue prior to that appt - let them/us know - evaluation if acute change.  Also, regarding her exposure to covid, confirm still doing ok - symptoms?  Continue to monitor and notify or be evaluated if symptoms or concerns.

## 2022-11-26 ENCOUNTER — Ambulatory Visit: Payer: Medicare HMO | Admitting: Physical Therapy

## 2022-11-26 ENCOUNTER — Telehealth: Payer: Medicare HMO | Admitting: Psychiatry

## 2022-11-27 ENCOUNTER — Telehealth: Payer: Medicare HMO | Admitting: Psychiatry

## 2022-11-27 ENCOUNTER — Encounter: Payer: Self-pay | Admitting: Psychiatry

## 2022-11-27 DIAGNOSIS — F331 Major depressive disorder, recurrent, moderate: Secondary | ICD-10-CM | POA: Diagnosis not present

## 2022-11-27 DIAGNOSIS — F32A Depression, unspecified: Secondary | ICD-10-CM | POA: Diagnosis not present

## 2022-11-27 DIAGNOSIS — J449 Chronic obstructive pulmonary disease, unspecified: Secondary | ICD-10-CM | POA: Diagnosis not present

## 2022-11-27 DIAGNOSIS — F17200 Nicotine dependence, unspecified, uncomplicated: Secondary | ICD-10-CM | POA: Diagnosis not present

## 2022-11-27 DIAGNOSIS — I25119 Atherosclerotic heart disease of native coronary artery with unspecified angina pectoris: Secondary | ICD-10-CM | POA: Diagnosis not present

## 2022-11-27 DIAGNOSIS — I10 Essential (primary) hypertension: Secondary | ICD-10-CM | POA: Diagnosis not present

## 2022-11-27 DIAGNOSIS — F418 Other specified anxiety disorders: Secondary | ICD-10-CM

## 2022-11-27 DIAGNOSIS — F17210 Nicotine dependence, cigarettes, uncomplicated: Secondary | ICD-10-CM | POA: Diagnosis not present

## 2022-11-27 DIAGNOSIS — Z716 Tobacco abuse counseling: Secondary | ICD-10-CM | POA: Diagnosis not present

## 2022-11-27 DIAGNOSIS — E1169 Type 2 diabetes mellitus with other specified complication: Secondary | ICD-10-CM | POA: Diagnosis not present

## 2022-11-27 DIAGNOSIS — I872 Venous insufficiency (chronic) (peripheral): Secondary | ICD-10-CM | POA: Diagnosis not present

## 2022-11-27 DIAGNOSIS — Z634 Disappearance and death of family member: Secondary | ICD-10-CM | POA: Diagnosis not present

## 2022-11-27 DIAGNOSIS — G2581 Restless legs syndrome: Secondary | ICD-10-CM

## 2022-11-27 DIAGNOSIS — G4701 Insomnia due to medical condition: Secondary | ICD-10-CM

## 2022-11-27 MED ORDER — TRAZODONE HCL 100 MG PO TABS: 150.0000 mg | ORAL_TABLET | Freq: Every day | ORAL | 0 refills | Status: DC

## 2022-11-27 NOTE — Progress Notes (Signed)
Virtual Visit via Video Note  I connected with Kathryn Friedman on 11/27/22 at  4:30 PM EST by a video enabled telemedicine application and verified that I am speaking with the correct person using two identifiers.  Location Provider Location : [facility] Patient Location : Home  Participants: Patient , Provider    I discussed the limitations of evaluation and management by telemedicine and the availability of in person appointments. The patient expressed understanding and agreed to proceed.   I discussed the assessment and treatment plan with the patient. The patient was provided an opportunity to ask questions and all were answered. The patient agreed with the plan and demonstrated an understanding of the instructions.   The patient was advised to call back or seek an in-person evaluation if the symptoms worsen or if the condition fails to improve as anticipated.   BH MD OP Progress Note  11/28/2022 9:47 AM KARNA DOWNHOUR  MRN:  [id number]  Chief Complaint:  Chief Complaint  Patient presents with   Follow-up   Medication Refill   Anxiety   Depression   HPI: Kathryn Friedman is a 67 year old Caucasian female, widowed, retired, on [employer], lives in [place], has a history of MDD, insomnia, obstructive sleep apnea on CPAP, eye surgery, coronary artery disease status post stent placement, COPD, diabetes mellitus, hypertension, GERD, interstitial lung disease, iron deficiency was evaluated by telemedicine today.  Patient today reports the holidays were hard.  She reports she went through some of her old belongings and came across autopsy report of her ex husband.  She reports that triggered a lot of memories.  She hence has been struggling with sadness, low motivation, low energy, sleep problems, concentration problems.  Getting worse since the past several days.  She reports the trazodone at this dosage has not been very helpful with sleep at all.  Patient is currently not in psychotherapy,  motivated to start therapy.  Patient reports since the death anniversary of her late husband coming up on Monday and that also is a stressor for her.  She plans to spend the day with her sister if possible.  Patient currently denies any suicidality, homicidality or perceptual disturbances.  Patient is currently compliant on medications, denies side effects.  Patient appeared to be alert, oriented to person place time situation.  Denies any other concerns today.  Visit Diagnosis:    ICD-10-CM   1. MDD (major depressive disorder), recurrent episode, moderate (HCC)  F33.1 traZODone (DESYREL) 100 MG tablet    2. Insomnia due to medical condition  G47.01    OSA, RLS    3. Restless leg syndrome  G25.81     4. Other specified anxiety disorders  F41.8    Limited symptom attacks    5. Bereavement  Z63.4     6. Tobacco use disorder  F17.200       Past Psychiatric History: Reviewed past psychiatric history from progress note on 06/26/2020.  Past trials of trazodone, Cymbalta, Lexapro, Rexulti, doxepin, gabapentin, Lunesta.  Patient was previously under the care of [employer] [facility].  Past Medical History:  Past Medical History:  Diagnosis Date   Anemia    Anginal pain (HCC)    Anxiety    Arthritis    back and knees   Asthma    Bilateral carotid artery stenosis    Blood in stool    Chronic diarrhea    COPD (chronic obstructive pulmonary disease) (HCC)    Coronary artery disease  a.) 75% pRCA; 3.5 x 28 mm Cypher DES placed on 07/24/2006   Current use of long term anticoagulation    Clopidogrel   Depression    secondary to the death of her husband (died 1994-12-29)   Diverticulitis    Diverticulosis    Dizzinesses    Dysphagia    Dyspnea    Dysrhythmia    Fatty infiltration of liver    GERD (gastroesophageal reflux disease)    Headache    History of 2017/12/29 novel coronavirus disease (COVID-19) 12/09/2020   History of 12/29/17 novel coronavirus disease (COVID-19) 12/20/2020    Hypertension    Hypertriglyceridemia    ILD (interstitial lung disease) (HCC)    Lump in the abdomen    OSA on CPAP    Overactive bladder    PSVT (paroxysmal supraventricular tachycardia)    Spastic colon    T2DM (type 2 diabetes mellitus) (HCC) 05/2008   Tobacco abuse    Venous insufficiency of both lower extremities     Past Surgical History:  Procedure Laterality Date   ABDOMINAL HYSTERECTOMY  with left ovary in place Dec 29, 1994   APPENDECTOMY  1985   gallbladder and Appendix   BREAST BIOPSY Left 10/14/2017   calcs bx, fibrosis giant cell reaction and chronic inflammation, negative for malignancy.    CARPOMETACARPAL (CMC) FUSION OF THUMB Right 07/10/2022   Procedure: CARPOMETACARPAL (CMC) SUSPENSION OF RIGHT THUMB;  Surgeon: Christena Flake, MD;  Location: [facility] ORS;  Service: Orthopedics;  Laterality: Right;   CATARACT EXTRACTION, BILATERAL     CESAREAN SECTION  1984   CHOLECYSTECTOMY  1985   COLONOSCOPY WITH PROPOFOL N/A 09/13/2016   Procedure: COLONOSCOPY WITH PROPOFOL;  Surgeon: Scot Jun, MD;  Location: [facility] ENDOSCOPY;  Service: Endoscopy;  Laterality: N/A;   COLONOSCOPY WITH PROPOFOL N/A 11/09/2018   Procedure: COLONOSCOPY WITH PROPOFOL;  Surgeon: Scot Jun, MD;  Location: [facility] ENDOSCOPY;  Service: Endoscopy;  Laterality: N/A;   COLONOSCOPY WITH PROPOFOL N/A 03/29/2020   Procedure: COLONOSCOPY WITH PROPOFOL;  Surgeon: Earline Mayotte, MD;  Location: [facility] ENDOSCOPY;  Service: Endoscopy;  Laterality: N/A;   CORONARY ANGIOPLASTY WITH STENT PLACEMENT N/A 07/24/2006   75% pRCA; 3.5 x 28 mm Cypher DES placed; Location: [facility]; Surgeons: Rudean Hitt, MD   ESOPHAGOGASTRODUODENOSCOPY (EGD) WITH PROPOFOL N/A 02/02/2018   Procedure: ESOPHAGOGASTRODUODENOSCOPY (EGD) WITH PROPOFOL;  Surgeon: Scot Jun, MD;  Location: [facility] ENDOSCOPY;  Service: Endoscopy;  Laterality: N/A;   ESOPHAGOGASTRODUODENOSCOPY (EGD) WITH PROPOFOL N/A 03/29/2020   Procedure:  ESOPHAGOGASTRODUODENOSCOPY (EGD) WITH PROPOFOL;  Surgeon: Earline Mayotte, MD;  Location: [facility] ENDOSCOPY;  Service: Endoscopy;  Laterality: N/A;   EYE SURGERY     JOINT REPLACEMENT     bilateral knee replacements   KNEE ARTHROSCOPY  Arthroscopic left knee surgery    KNEE SURGERY  status post knee surgey    LEFT HEART CATH AND CORONARY ANGIOGRAPHY Left 05/14/2018   Procedure: LEFT HEART CATH AND CORONARY ANGIOGRAPHY;  Surgeon: Lamar Blinks, MD;  Location: [facility] INVASIVE CV LAB;  Service: Cardiovascular;  Laterality: Left;   REPLACEMENT TOTAL KNEE  (DHS)   SHOULDER SURGERY  shoulder operation secondary to a torn tendon   TOTAL HIP ARTHROPLASTY Left 06/12/2021   Procedure: TOTAL HIP ARTHROPLASTY;  Surgeon: Christena Flake, MD;  Location: [facility] ORS;  Service: Orthopedics;  Laterality: Left;    Family Psychiatric History: Reviewed family psychiatric history from progress note on 06/26/2020.  Family History:  Family History  Problem Relation Age of Onset  Other Mother        Hit by a fire truck and has had multiple operations on her back , and has history of MVP    Mitral valve prolapse Mother    Lung cancer Mother    Depression Mother    Heart disease Father        myocardial infarction and is status post bypass surgery   Mitral valve prolapse Sister    Bipolar disorder Sister    Hepatitis C Brother    Cirrhosis Brother    Colon cancer Paternal Aunt    Breast cancer Neg Hx    Prostate cancer Neg Hx    Bladder Cancer Neg Hx    Kidney cancer Neg Hx     Social History: Reviewed social history from progress note on 06/26/2020. Social History   Socioeconomic History   Marital status: Widowed    Spouse name: Deidree Fraioli   Number of children: 1   Years of education: 12   Highest education level: 12th grade  Occupational History    Employer: [employer]  Tobacco Use   Smoking status: Every Day    Packs/day: 2.00    Years: 45.00    Total pack years: 90.00    Types: Cigarettes     Passive exposure: Current   Smokeless tobacco: Never   Tobacco comments:    1PPD 10/04/2022  Vaping Use   Vaping Use: Some days   Substances: Flavoring  Substance and Sexual Activity   Alcohol use: No    Alcohol/week: 0.0 standard drinks of alcohol   Drug use: No   Sexual activity: Not Currently  Other Topics Concern   Not on file  Social History Narrative   Not on file   Social Determinants of Health   Financial Resource Strain: Medium Risk (01/11/2022)   Overall Financial Resource Strain (CARDIA)    Difficulty of Paying Living Expenses: Somewhat hard  Food Insecurity: No Food Insecurity (05/03/2022)   Hunger Vital Sign    Worried About Running Out of Food in the Last Year: Never true    Ran Out of Food in the Last Year: Never true  Transportation Needs: No Transportation Needs (05/03/2022)   PRAPARE - Administrator, Civil Service (Medical): No    Lack of Transportation (Non-Medical): No  Physical Activity: Unknown (05/03/2022)   Exercise Vital Sign    Days of Exercise per Week: 0 days    Minutes of Exercise per Session: Not on file  Recent Concern: Physical Activity - Inactive (05/03/2022)   Exercise Vital Sign    Days of Exercise per Week: 0 days    Minutes of Exercise per Session: 0 min  Stress: No Stress Concern Present (05/03/2022)   [employer] of Occupational Health - Occupational Stress Questionnaire    Feeling of Stress : Not at all  Social Connections: Moderately Isolated (05/03/2022)   Social Connection and Isolation Panel [NHANES]    Frequency of Communication with Friends and Family: More than three times a week    Frequency of Social Gatherings with Friends and Family: More than three times a week    Attends Religious Services: 1 to 4 times per year    Active Member of [employer] or Organizations: No    Attends Banker Meetings: Never    Marital Status: Widowed    Allergies:  Allergies  Allergen Reactions   Varenicline Other (See  Comments)    "I got really depressed" "I got really depressed" CHANTEX  Varenicline Tartrate    Jardiance [Empagliflozin] Other (See Comments)    Yeast infection   Metformin And Related Other (See Comments)    Diarrhea, even with XR   Methylprednisolone Nausea Only and Nausea And Vomiting    Metabolic Disorder Labs: Lab Results  Component Value Date   HGBA1C 7.0 (H) 06/17/2022   No results found for: "PROLACTIN" Lab Results  Component Value Date   CHOL 114 06/17/2022   TRIG 94.0 06/17/2022   HDL 49.70 06/17/2022   CHOLHDL 2 06/17/2022   VLDL 18.8 06/17/2022   LDLCALC 46 06/17/2022   LDLCALC 62 03/05/2022   Lab Results  Component Value Date   TSH 1.23 06/17/2022   TSH 0.87 07/06/2021    Therapeutic Level Labs: No results found for: "LITHIUM" No results found for: "VALPROATE" No results found for: "CBMZ"  Current Medications: Current Outpatient Medications  Medication Sig Dispense Refill   Accu-Chek Softclix Lancets lancets Use as instructed 100 each 12   albuterol (VENTOLIN HFA) 108 (90 Base) MCG/ACT inhaler INHALE 2 PUFFS FOUR TIMES A DAY 8.5 g 11   amLODipine (NORVASC) 10 MG tablet TAKE 1 TABLET EVERY DAY 90 tablet 0   apixaban (ELIQUIS) 5 MG TABS tablet Take 1 tablet (5 mg total) by mouth 2 (two) times daily. 180 tablet 3   Blood Glucose Monitoring Suppl (TRUE METRIX METER) w/Device KIT Use as directed twice a day to check sugars 1 kit 0   Budeson-Glycopyrrol-Formoterol (BREZTRI AEROSPHERE) 160-9-4.8 MCG/ACT AERO Inhale 2 puffs into the lungs in the morning and at bedtime. 32.1 g 3   CALCIUM PO Take 600 mg by mouth daily.      cholecalciferol (VITAMIN D3) 25 MCG (1000 UNIT) tablet Take 1,000 Units by mouth daily.     Cyanocobalamin (VITAMIN B-12 PO) Take 2,500 mcg by mouth daily.     DULoxetine (CYMBALTA) 60 MG capsule TAKE 1 CAPSULE EVERY DAY 90 capsule 1   famotidine (PEPCID) 40 MG tablet Take 1 tablet (40 mg total) by mouth daily. 90 tablet 3   HM MELATONIN  PO 5 mg by Other route at bedtime as needed. patch     isosorbide mononitrate (IMDUR) 30 MG 24 hr tablet TAKE 1 TABLET EVERY DAY 90 tablet 3   Multiple Vitamin (MULTIVITAMIN) tablet Take 1 tablet by mouth daily.     mupirocin ointment (BACTROBAN) 2 % Apply 1 application. topically 2 (two) times daily. 22 g 0   naproxen (EC NAPROSYN) 500 MG EC tablet Take 500 mg by mouth 2 (two) times daily with a meal.     nystatin (MYCOSTATIN/NYSTOP) powder APPLY TO THE AFFECTED AREA(S) TWICE DAILY 30 g 0   pantoprazole (PROTONIX) 40 MG tablet TAKE 1 TABLET TWICE DAILY BEFORE MEALS 180 tablet 1   propranolol ER (INDERAL LA) 80 MG 24 hr capsule TAKE 1 CAPSULE TWICE DAILY 180 capsule 3   rOPINIRole (REQUIP) 0.5 MG tablet TAKE 1 TABLET (0.5 MG TOTAL) BY MOUTH DAILY AFTER LUNCH. 90 tablet 0   rOPINIRole (REQUIP) 3 MG tablet Take 1 tablet (3 mg total) by mouth at bedtime. Take along with 0.5 mg daily 60 tablet 2   rosuvastatin (CRESTOR) 20 MG tablet TAKE 1 TABLET EVERY DAY 90 tablet 10   Semaglutide,0.25 or 0.5MG /DOS, (OZEMPIC, 0.25 OR 0.5 MG/DOSE,) 2 MG/1.5ML SOPN Inject 0.25 mg weekly for 4 weeks then increase to 0.5 mg weekly (Patient taking differently: Inject 0.5 mg into the skin once a week. Inject 0.25 mg weekly for 4 weeks then  increase to 0.5 mg weekly) 1.5 mL 2   telmisartan (MICARDIS) 80 MG tablet TAKE 1 TABLET EVERY DAY 90 tablet 1   tiZANidine (ZANAFLEX) 2 MG tablet Take 2 mg by mouth 3 (three) times daily as needed.     traMADol (ULTRAM) 50 MG tablet Take by mouth.     trimethoprim (TRIMPEX) 100 MG tablet Take 1 tablet (100 mg total) by mouth daily. 90 tablet 3   TRUE METRIX BLOOD GLUCOSE TEST test strip USE AS INSTRUCTED TO CHECK BLOOD SUGARS TWICE DAILY. 200 strip 10   Vibegron (GEMTESA) 75 MG TABS Take 75 mg by mouth daily. 90 tablet 3   acetaminophen (TYLENOL) 325 MG tablet Take 650 mg by mouth every 6 (six) hours as needed. (Patient not taking: Reported on 11/27/2022)     traZODone (DESYREL) 100 MG  tablet Take 1.5-2 tablets (150-200 mg total) by mouth at bedtime. 60 tablet 0   No current facility-administered medications for this visit.     Musculoskeletal: Strength & Muscle Tone:  UTA Gait & Station:  Seated Patient leans: N/A  Psychiatric Specialty Exam: Review of Systems  Psychiatric/Behavioral:  Positive for decreased concentration, dysphoric mood and sleep disturbance. The patient is nervous/anxious.   All other systems reviewed and are negative.   There were no vitals taken for this visit.There is no height or weight on file to calculate BMI.  General Appearance: Casual  Eye Contact:  Fair  Speech:  Normal Rate  Volume:  Normal  Mood:  Anxious and Depressed  Affect:  Congruent  Thought Process:  Goal Directed and Descriptions of Associations: Intact  Orientation:  Full (Time, Place, and Person)  Thought Content: Logical   Suicidal Thoughts:  No  Homicidal Thoughts:  No  Memory:  Immediate;   Fair Recent;   Fair Remote;   Fair  Judgement:  Fair  Insight:  Fair  Psychomotor Activity:  Normal  Concentration:  Concentration: Fair and Attention Span: Fair  Recall:  [employer] of Knowledge: Fair  Language: Fair  Akathisia:  No  Handed:  Right  AIMS (if indicated): not done  Assets:  Communication Skills Desire for Improvement Housing Transportation  ADL's:  Intact  Cognition: WNL  Sleep:  Poor   Screenings: AIMS    Flowsheet Row Office Visit from 05/14/2022 in [facility] Video Visit from 04/03/2022 in [facility]  AIMS Total Score 0 0      GAD-7    Flowsheet Row Video Visit from 06/25/2021 in [facility] Video Visit from 05/03/2021 in [facility] Video Visit from 04/05/2021 in [facility]  Total GAD-7 Score 6 3 9       PHQ2-9    Flowsheet Row Video Visit from 11/27/2022 in [facility] Office Visit from 10/11/2022 in [facility] [place] Video Visit from 08/30/2022 in [facility] Video Visit from 08/06/2022 in [facility] Video Visit from 06/24/2022 in [facility] [place]  PHQ-2 Total Score 6 2 0 0 6  PHQ-9 Total Score 19 4 -- -- 24      Flowsheet Row Video Visit from 11/27/2022 in [facility] Video Visit from 08/30/2022 in [facility] Video Visit from 08/06/2022 in [facility]  C-SSRS RISK CATEGORY No Risk Low Risk Low Risk        Assessment and Plan: NANNETTA TURK is a 67 year old Caucasian female who  has a history of MDD, anxiety disorder, bereavement, sleep problems, presented for medication management.  Patient with worsening mood symptoms recently as well as sleep problems, will benefit from the following plan.  Plan MDD-unstable Continue Cymbalta 60 mg p.o. daily.  Will consider readjusting the dosage or adding another psychotropic to address her depression in the future. Will increase trazodone to 150-200 mg at bedtime for sleep now. Referral for CBT-patient with recent death in the family will need to follow up with therapist. I have communicated with staff to schedule this patient with our therapist.  Other specified anxiety disorder-unstable Cymbalta 60 mg p.o. daily  Insomnia-unstable Continue CPAP for OSA Requip 3 mg at bedtime and 0.5 mg in the afternoon. Increase trazodone to 150-200 mg at bedtime.  RLS-improving Requip as prescribed  Bereavement-unstable Referral for CBT  Tobacco use disorder-improving Will monitor closely. Patient used to follow up with you smoking cessation class. Discussed medications like Wellbutrin in the past was not interested.  Follow-up and manage in 10 days or sooner if needed.   Collaboration of Care: Collaboration of Care: Referral or  follow-up with counselor/therapist AEB I have communicated with staff to schedule this patient without therapist.  Patient/Guardian was advised Release of Information must be obtained prior to any record release in order to collaborate their care with an outside provider. Patient/Guardian was advised if they have not already done so to contact the registration department to sign all necessary forms in order for [place] to release information regarding their care.   Consent: Patient/Guardian gives verbal consent for treatment and assignment of benefits for services provided during this visit. Patient/Guardian expressed understanding and agreed to proceed.   This note was generated in part or whole with voice recognition software. Voice recognition is usually quite accurate but there are transcription errors that can and very often do occur. I apologize for any typographical errors that were not detected and corrected.      Jomarie Longs, MD 11/28/2022, 9:47 AM

## 2022-11-28 ENCOUNTER — Ambulatory Visit: Payer: Medicare HMO | Admitting: Physical Therapy

## 2022-11-29 ENCOUNTER — Other Ambulatory Visit: Payer: Self-pay | Admitting: Internal Medicine

## 2022-11-29 ENCOUNTER — Ambulatory Visit: Payer: Medicare HMO | Admitting: Physician Assistant

## 2022-12-01 ENCOUNTER — Encounter: Payer: Self-pay | Admitting: Internal Medicine

## 2022-12-03 ENCOUNTER — Ambulatory Visit: Payer: Medicare HMO | Admitting: Physical Therapy

## 2022-12-03 DIAGNOSIS — Z79899 Other long term (current) drug therapy: Secondary | ICD-10-CM | POA: Diagnosis not present

## 2022-12-05 ENCOUNTER — Ambulatory Visit: Payer: Medicare HMO | Admitting: Physical Therapy

## 2022-12-06 ENCOUNTER — Telehealth: Payer: Self-pay | Admitting: Pharmacy Technician

## 2022-12-06 DIAGNOSIS — Z596 Low income: Secondary | ICD-10-CM

## 2022-12-06 NOTE — Progress Notes (Signed)
[facility])                                            [place] Team    12/06/2022  Kathryn Friedman Dec 27, 1955 [id number]  Care coordination call placed to [place] in regard to [place] application.   Spoke to [place] who informs patient is APPROVED 11/27/22-11/25/23. Medications will ship based on last fill dates in 2023 to the provider's office.  [name] P. Larri Yehle, [place]  [phone]

## 2022-12-10 ENCOUNTER — Telehealth: Payer: Self-pay | Admitting: Pharmacy Technician

## 2022-12-10 ENCOUNTER — Telehealth (INDEPENDENT_AMBULATORY_CARE_PROVIDER_SITE_OTHER): Payer: Medicare HMO | Admitting: Psychiatry

## 2022-12-10 ENCOUNTER — Encounter: Payer: Self-pay | Admitting: Psychiatry

## 2022-12-10 DIAGNOSIS — G2581 Restless legs syndrome: Secondary | ICD-10-CM

## 2022-12-10 DIAGNOSIS — Z634 Disappearance and death of family member: Secondary | ICD-10-CM | POA: Diagnosis not present

## 2022-12-10 DIAGNOSIS — F418 Other specified anxiety disorders: Secondary | ICD-10-CM | POA: Diagnosis not present

## 2022-12-10 DIAGNOSIS — Z596 Low income: Secondary | ICD-10-CM

## 2022-12-10 DIAGNOSIS — F17200 Nicotine dependence, unspecified, uncomplicated: Secondary | ICD-10-CM

## 2022-12-10 DIAGNOSIS — F17210 Nicotine dependence, cigarettes, uncomplicated: Secondary | ICD-10-CM | POA: Diagnosis not present

## 2022-12-10 DIAGNOSIS — G4701 Insomnia due to medical condition: Secondary | ICD-10-CM

## 2022-12-10 DIAGNOSIS — F3342 Major depressive disorder, recurrent, in full remission: Secondary | ICD-10-CM | POA: Diagnosis not present

## 2022-12-10 DIAGNOSIS — F331 Major depressive disorder, recurrent, moderate: Secondary | ICD-10-CM

## 2022-12-10 NOTE — Progress Notes (Signed)
[facility])                                            [place] Team    12/10/2022  Kathryn Friedman [birth year] [id number]  Care coordination call placed to [employer] in regard to [facility] application.  Spoke to [place] who informs patient is APPROVED 11/25/22-11/25/23. She informs medication will auto fill based and last fill dates in 2023 with delivery to the patient's home address.  Kimya Mccahill P. Atiya Yera, [place]  [phone]
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
## 2022-12-12 DIAGNOSIS — I25119 Atherosclerotic heart disease of native coronary artery with unspecified angina pectoris: Secondary | ICD-10-CM | POA: Diagnosis not present

## 2022-12-12 DIAGNOSIS — E1169 Type 2 diabetes mellitus with other specified complication: Secondary | ICD-10-CM | POA: Diagnosis not present

## 2022-12-12 DIAGNOSIS — I872 Venous insufficiency (chronic) (peripheral): Secondary | ICD-10-CM | POA: Diagnosis not present

## 2022-12-12 DIAGNOSIS — F17200 Nicotine dependence, unspecified, uncomplicated: Secondary | ICD-10-CM | POA: Diagnosis not present

## 2022-12-12 DIAGNOSIS — J449 Chronic obstructive pulmonary disease, unspecified: Secondary | ICD-10-CM | POA: Diagnosis not present

## 2022-12-12 DIAGNOSIS — I10 Essential (primary) hypertension: Secondary | ICD-10-CM | POA: Diagnosis not present

## 2022-12-12 DIAGNOSIS — F32A Depression, unspecified: Secondary | ICD-10-CM | POA: Diagnosis not present

## 2022-12-14 ENCOUNTER — Encounter: Payer: Self-pay | Admitting: Internal Medicine

## 2022-12-17 ENCOUNTER — Telehealth: Payer: Self-pay

## 2022-12-17 ENCOUNTER — Telehealth: Payer: Self-pay | Admitting: Psychiatry

## 2022-12-17 DIAGNOSIS — G2581 Restless legs syndrome: Secondary | ICD-10-CM

## 2022-12-17 DIAGNOSIS — G4701 Insomnia due to medical condition: Secondary | ICD-10-CM

## 2022-12-17 MED ORDER — ROPINIROLE HCL 3 MG PO TABS: 3.0000 mg | ORAL_TABLET | Freq: Every day | ORAL | 0 refills | Status: DC

## 2022-12-17 MED ORDER — ROPINIROLE HCL 0.5 MG PO TABS: 0.5000 mg | ORAL_TABLET | Freq: Every day | ORAL | 0 refills | Status: DC

## 2022-12-17 NOTE — Telephone Encounter (Signed)
Done , I have sent a phone note.

## 2022-12-17 NOTE — Telephone Encounter (Signed)
Patient called requesting a refill for the following medication and to also report that she is changing pharmacies she stated that she will no longer be using [facility] pharmacy called pharmacy spoke to [place] cancelled all refills  Last visit 12/10/22 Next visit 02/06/23   Disp Refills Start End   rOPINIRole (REQUIP) 3 MG tablet 60 tablet 2 10/11/2022    Sig - Route: Take 1 tablet (3 mg total) by mouth at bedtime. Take along with 0.5 mg daily - Oral   Sent to pharmacy as: rOPINIRole (REQUIP) 3 MG tablet     Preferred pharmacy [place], [place] Phone: [phone]  Fax: [phone]

## 2022-12-17 NOTE — Telephone Encounter (Signed)
I have send Requip 3.5 mg daily-2 prescriptions-90 days supply.  [facility].

## 2022-12-19 ENCOUNTER — Other Ambulatory Visit: Payer: Medicare HMO

## 2022-12-20 ENCOUNTER — Other Ambulatory Visit (INDEPENDENT_AMBULATORY_CARE_PROVIDER_SITE_OTHER): Payer: Medicare HMO

## 2022-12-20 ENCOUNTER — Ambulatory Visit (INDEPENDENT_AMBULATORY_CARE_PROVIDER_SITE_OTHER): Payer: Medicare HMO | Admitting: Licensed Clinical Social Worker

## 2022-12-20 ENCOUNTER — Telehealth: Payer: Self-pay

## 2022-12-20 DIAGNOSIS — F418 Other specified anxiety disorders: Secondary | ICD-10-CM | POA: Diagnosis not present

## 2022-12-20 DIAGNOSIS — D582 Other hemoglobinopathies: Secondary | ICD-10-CM | POA: Diagnosis not present

## 2022-12-20 DIAGNOSIS — F331 Major depressive disorder, recurrent, moderate: Secondary | ICD-10-CM | POA: Diagnosis not present

## 2022-12-20 DIAGNOSIS — E7800 Pure hypercholesterolemia, unspecified: Secondary | ICD-10-CM | POA: Diagnosis not present

## 2022-12-20 DIAGNOSIS — E1159 Type 2 diabetes mellitus with other circulatory complications: Secondary | ICD-10-CM | POA: Diagnosis not present

## 2022-12-20 DIAGNOSIS — Z634 Disappearance and death of family member: Secondary | ICD-10-CM | POA: Diagnosis not present

## 2022-12-20 DIAGNOSIS — I10 Essential (primary) hypertension: Secondary | ICD-10-CM

## 2022-12-20 DIAGNOSIS — F4310 Post-traumatic stress disorder, unspecified: Secondary | ICD-10-CM

## 2022-12-20 LAB — CBC WITH DIFFERENTIAL/PLATELET
Basophils Absolute: 0 10*3/uL (ref 0.0–0.1)
Basophils Relative: 0.5 % (ref 0.0–3.0)
Eosinophils Absolute: 0.2 10*3/uL (ref 0.0–0.7)
Eosinophils Relative: 2.1 % (ref 0.0–5.0)
HCT: 42.7 % (ref 36.0–46.0)
Hemoglobin: 14.6 g/dL (ref 12.0–15.0)
Lymphocytes Relative: 22.5 % (ref 12.0–46.0)
Lymphs Abs: 1.6 10*3/uL (ref 0.7–4.0)
MCHC: 34.1 g/dL (ref 30.0–36.0)
MCV: 99.1 fl (ref 78.0–100.0)
Monocytes Absolute: 0.4 10*3/uL (ref 0.1–1.0)
Monocytes Relative: 6 % (ref 3.0–12.0)
Neutro Abs: 4.9 10*3/uL (ref 1.4–7.7)
Neutrophils Relative %: 68.9 % (ref 43.0–77.0)
Platelets: 144 10*3/uL — ABNORMAL LOW (ref 150.0–400.0)
RBC: 4.31 Mil/uL (ref 3.87–5.11)
RDW: 14.1 % (ref 11.5–15.5)
WBC: 7.1 10*3/uL (ref 4.0–10.5)

## 2022-12-20 LAB — HEPATIC FUNCTION PANEL
ALT: 28 U/L (ref 0–35)
AST: 21 U/L (ref 0–37)
Albumin: 3.8 g/dL (ref 3.5–5.2)
Alkaline Phosphatase: 77 U/L (ref 39–117)
Bilirubin, Direct: 0.1 mg/dL (ref 0.0–0.3)
Total Bilirubin: 0.5 mg/dL (ref 0.2–1.2)
Total Protein: 6.1 g/dL (ref 6.0–8.3)

## 2022-12-20 LAB — BASIC METABOLIC PANEL
BUN: 12 mg/dL (ref 6–23)
CO2: 27 mEq/L (ref 19–32)
Calcium: 9 mg/dL (ref 8.4–10.5)
Chloride: 102 mEq/L (ref 96–112)
Creatinine, Ser: 0.63 mg/dL (ref 0.40–1.20)
GFR: 92.21 mL/min (ref >60.00)
Glucose, Bld: 133 mg/dL — ABNORMAL HIGH (ref 70–99)
Potassium: 4.1 mEq/L (ref 3.5–5.1)
Sodium: 137 mEq/L (ref 135–145)

## 2022-12-20 LAB — MICROALBUMIN / CREATININE URINE RATIO
Creatinine,U: 54.2 mg/dL
Microalb Creat Ratio: 1.3 mg/g (ref 0.0–30.0)
Microalb, Ur: <0.7 mg/dL (ref 0.0–1.9)

## 2022-12-20 LAB — LIPID PANEL
Cholesterol: 119 mg/dL (ref 0–200)
HDL: 44.7 mg/dL (ref >39.00)
LDL Cholesterol: 53 mg/dL (ref 0–99)
NonHDL: 74.55
Total CHOL/HDL Ratio: 3
Triglycerides: 106 mg/dL (ref 0.0–149.0)
VLDL: 21.2 mg/dL (ref 0.0–40.0)

## 2022-12-20 LAB — HEMOGLOBIN A1C: Hgb A1c MFr Bld: 6.2 % (ref 4.6–6.5)

## 2022-12-20 NOTE — Telephone Encounter (Signed)
Patient assistance Ozempic received. Patient was in office for labs. Meds given to patient.
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
## 2022-12-23 ENCOUNTER — Encounter: Payer: Medicare HMO | Admitting: Internal Medicine

## 2022-12-23 NOTE — Progress Notes (Unsigned)
Cardiology Office Note  Date:  12/24/2022   ID:  Kathryn Friedman, Case 1956-02-16, MRN [id number]  PCP:  Einar Pheasant, MD   Chief Complaint  Patient presents with   6 month follow up     Patient c/o shortness of breath and chest pain; mostly with activity. Medications reviewed by the patient verbally.     HPI:  Kathryn Friedman is a 67 year old woman with past medical history of Depression Coronary artery disease catheterization June 2019 moderate to severe proximal and mid RCA disease occluded distal RCA with collaterals left to right  mild LAD and circumflex disease Stent x2 2007 Smoker 2 ppd /COPD Hyperlipidemia Who presents for f/u of her coronary disease, paroxysmal atrial fibrillation  Last seen by myself in clinic June 2023 Seen by one of our providers November 2023, preop for Botox bladder treatment Lives alone, husband died Groceries delivered Back and hip pain  Smokes <1 ppd  Does surveys to make money  Problem affording her Eliquis, not covered well with insurance Tried patient assistance 2 times, was denied  Labs reviewed A1C 6.2 down to 7 CR 0.63 HGB 14.6  EKG personally reviewed by myself on todays visit Normal sinus rhythm rate 68 bpm no significant ST-T wave changes  Other past medical history reviewed Zio monitor  Frequent episodes of atrial fibrillation noted, 3% burden 151 Supraventricular Tachycardia runs occurred, the run with the fastest interval lasting 5 beats with a max rate of 179 bpm, the  longest lasting 21.4 secs with an avg rate of 101 bpm.   Note indicating history of DVT  episode of near syncope in shower, discussed March 2023 "Woke up on the ground" in the shower but reports never losing consciousness Minimal warning "Could it be atrial fibrillation" Has not had any further episodes  Husband died, fall, On hospice, 2021-12-24  Carotid u/s  07/2020 1. Minimal atherosclerotic plaque bilaterally resulting in less  than 50% stenosis of the bilateral ICAs. 2. Antegrade flow is noted within both vertebral arteries.  Echo NORMAL LEFT VENTRICULAR SYSTOLIC FUNCTION  NORMAL RIGHT VENTRICULAR SYSTOLIC FUNCTION  MILD VALVULAR REGURGITATION (See above)  NO VALVULAR STENOSIS  MILD MR, TR  EF 55%   Underwent hip replacement Following surgery developed acute problem with a hip Seen in the ER after hearing a popping sound followed by right leg/hip pain.  Found to have avulsion fracture of greater trochanter left hip DVT prophylaxis following procedure Eliquis 2.5 twice daily Was started on Eliquis 2.5 twice daily by Dr. Roland Rack at discharge June 13, 2021    PMH:   has a past medical history of Anemia, Anginal pain ([place]), Anxiety, Arthritis, Asthma, Bilateral carotid artery stenosis, Blood in stool, Chronic diarrhea, COPD (chronic obstructive pulmonary disease) ([place]), Coronary artery disease, Current use of long term anticoagulation, Depression, Diverticulitis, Diverticulosis, Dizzinesses, Dysphagia, Dyspnea, Dysrhythmia, Fatty infiltration of liver, GERD (gastroesophageal reflux disease), Headache, History of 2019 novel coronavirus disease (COVID-19) (12/09/2020), History of 2019 novel coronavirus disease (COVID-19) (12/20/2020), Hypertension, Hypertriglyceridemia, ILD (interstitial lung disease) ([place]), Lump in the abdomen, OSA on CPAP, Overactive bladder, PSVT (paroxysmal supraventricular tachycardia), Spastic colon, T2DM (type 2 diabetes mellitus) ([place]) (05/2008), Tobacco abuse, and Venous insufficiency of both lower extremities.  PSH:    Past Surgical History:  Procedure Laterality Date   ABDOMINAL HYSTERECTOMY  with left ovary in place [place]   gallbladder and Appendix   BREAST BIOPSY Left 10/14/2017   calcs bx, fibrosis giant cell  reaction and chronic inflammation, negative for malignancy.    CARPOMETACARPAL ([facility]) FUSION OF THUMB Right 07/10/2022   Procedure: CARPOMETACARPAL ([place])  SUSPENSION OF RIGHT THUMB;  Surgeon: Corky Mull, MD;  Location: [facility] ORS;  Service: Orthopedics;  Laterality: Right;   CATARACT EXTRACTION, BILATERAL     CESAREAN SECTION  1984   CHOLECYSTECTOMY  1985   COLONOSCOPY WITH PROPOFOL N/A 09/13/2016   Procedure: COLONOSCOPY WITH PROPOFOL;  Surgeon: Manya Silvas, MD;  Location[facility] ENDOSCOPY;  Service: Endoscopy;  Laterality: N/A;   COLONOSCOPY WITH PROPOFOL N/A 11/09/2018   Procedure: COLONOSCOPY WITH PROPOFOL;  Surgeon: Manya Silvas, MD;  Location: [facility] ENDOSCOPY;  Service: Endoscopy;  Laterality: N/A;   COLONOSCOPY WITH PROPOFOL N/A 03/29/2020   Procedure: COLONOSCOPY WITH PROPOFOL;  Surgeon: Robert Bellow, MD;  Location: [facility] ENDOSCOPY;  Service: Endoscopy;  Laterality: N/A;   CORONARY ANGIOPLASTY WITH STENT PLACEMENT N/A 07/24/2006   75% pRCA; 3.5 x 28 mm Cypher DES placed; Location: [place]; Surgeons: Katrine Coho, MD   ESOPHAGOGASTRODUODENOSCOPY (EGD) WITH PROPOFOL N/A 02/02/2018   Procedure: ESOPHAGOGASTRODUODENOSCOPY (EGD) WITH PROPOFOL;  Surgeon: Manya Silvas, MD;  Location: [facility] ENDOSCOPY;  Service: Endoscopy;  Laterality: N/A;   ESOPHAGOGASTRODUODENOSCOPY (EGD) WITH PROPOFOL N/A 03/29/2020   Procedure: ESOPHAGOGASTRODUODENOSCOPY (EGD) WITH PROPOFOL;  Surgeon: Robert Bellow, MD;  Location: [facility] ENDOSCOPY;  Service: Endoscopy;  Laterality: N/A;   EYE SURGERY     JOINT REPLACEMENT     bilateral knee replacements   KNEE ARTHROSCOPY  Arthroscopic left knee surgery    KNEE SURGERY  status post knee surgey    LEFT HEART CATH AND CORONARY ANGIOGRAPHY Left 05/14/2018   Procedure: LEFT HEART CATH AND CORONARY ANGIOGRAPHY;  Surgeon: Corey Skains, MD;  Location: [facility];  Service: Cardiovascular;  Laterality: Left;   REPLACEMENT TOTAL KNEE  ([facility])   SHOULDER SURGERY  shoulder operation secondary to a torn tendon   TOTAL HIP ARTHROPLASTY Left 06/12/2021   Procedure: TOTAL HIP ARTHROPLASTY;  Surgeon:  Corky Mull, MD;  Location: [facility] ORS;  Service: Orthopedics;  Laterality: Left;    Current Outpatient Medications  Medication Sig Dispense Refill   albuterol (VENTOLIN HFA) 108 (90 Base) MCG/ACT inhaler INHALE 2 PUFFS FOUR TIMES A DAY 8.5 g 11   amLODipine (NORVASC) 10 MG tablet TAKE 1 TABLET EVERY DAY 90 tablet 3   apixaban (ELIQUIS) 5 MG TABS tablet Take 1 tablet (5 mg total) by mouth 2 (two) times daily. 180 tablet 3   Blood Glucose Monitoring Suppl (TRUE METRIX METER) w/Device KIT Use as directed twice a day to check sugars 1 kit 0   Budeson-Glycopyrrol-Formoterol (BREZTRI AEROSPHERE) 160-9-4.8 MCG/ACT AERO Inhale 2 puffs into the lungs in the morning and at bedtime. 32.1 g 3   CALCIUM PO Take 600 mg by mouth daily.      cholecalciferol (VITAMIN D3) 25 MCG (1000 UNIT) tablet Take 1,000 Units by mouth daily.     Cyanocobalamin (VITAMIN B-12 PO) Take 2,500 mcg by mouth daily.     DULoxetine (CYMBALTA) 60 MG capsule TAKE 1 CAPSULE EVERY DAY 90 capsule 1   famotidine (PEPCID) 40 MG tablet Take 1 tablet (40 mg total) by mouth daily. 90 tablet 3   HM MELATONIN PO 5 mg by Other route at bedtime as needed. patch     isosorbide mononitrate (IMDUR) 30 MG 24 hr tablet TAKE 1 TABLET EVERY DAY 90 tablet 3   Multiple Vitamin (MULTIVITAMIN) tablet Take 1 tablet by mouth daily.  mupirocin ointment (BACTROBAN) 2 % Apply 1 application. topically 2 (two) times daily. 22 g 0   nystatin (MYCOSTATIN/NYSTOP) powder APPLY TO THE AFFECTED AREA(S) TWICE DAILY 30 g 0   pantoprazole (PROTONIX) 40 MG tablet TAKE 1 TABLET TWICE DAILY BEFORE MEALS 180 tablet 1   propranolol ER (INDERAL LA) 80 MG 24 hr capsule TAKE 1 CAPSULE TWICE DAILY 180 capsule 3   rOPINIRole (REQUIP) 0.5 MG tablet Take 1 tablet (0.5 mg total) by mouth daily after lunch. 90 tablet 0   rOPINIRole (REQUIP) 3 MG tablet Take 1 tablet (3 mg total) by mouth at bedtime. Take along with 0.5 mg daily, total of 3.5 mg daily. 90 tablet 0   rosuvastatin  (CRESTOR) 20 MG tablet TAKE 1 TABLET EVERY DAY 90 tablet 10   Semaglutide,0.25 or 0.'5MG'$ /DOS, (OZEMPIC, 0.25 OR 0.5 MG/DOSE,) 2 MG/1.5ML SOPN Inject 0.25 mg weekly for 4 weeks then increase to 0.5 mg weekly (Patient taking differently: Inject 0.5 mg into the skin once a week. Inject 0.25 mg weekly for 4 weeks then increase to 0.5 mg weekly) 1.5 mL 2   telmisartan (MICARDIS) 80 MG tablet TAKE 1 TABLET EVERY DAY 90 tablet 1   traZODone (DESYREL) 100 MG tablet Take 1.5-2 tablets (150-200 mg total) by mouth at bedtime. 60 tablet 0   trimethoprim (TRIMPEX) 100 MG tablet Take 1 tablet (100 mg total) by mouth daily. 90 tablet 3   TRUE METRIX BLOOD GLUCOSE TEST test strip USE AS INSTRUCTED TO CHECK BLOOD SUGARS TWICE DAILY. 200 strip 10   Vibegron (GEMTESA) 75 MG TABS Take 75 mg by mouth daily. 90 tablet 3   Accu-Chek Softclix Lancets lancets Use as instructed (Patient not taking: Reported on 12/24/2022) 100 each 12   naproxen (EC NAPROSYN) 500 MG EC tablet Take 500 mg by mouth 2 (two) times daily with a meal. (Patient not taking: Reported on 12/24/2022)     tiZANidine (ZANAFLEX) 2 MG tablet Take 2 mg by mouth 3 (three) times daily as needed. (Patient not taking: Reported on 12/24/2022)     No current facility-administered medications for this visit.     Allergies:   Varenicline, Varenicline tartrate, Jardiance [empagliflozin], Metformin and related, and Methylprednisolone   Social History:  The patient  reports that she has been smoking cigarettes. She has a 90.00 pack-year smoking history. She has been exposed to tobacco smoke. She has never used smokeless tobacco. She reports that she does not drink alcohol and does not use drugs.   Family History:   family history includes Bipolar disorder in her sister; Cirrhosis in her brother; Colon cancer in her paternal aunt; Depression in her mother; Heart disease in her father; Hepatitis C in her brother; Lung cancer in her mother; Mitral valve prolapse in her  mother and sister; Other in her mother.    Review of Systems: Review of Systems  Constitutional: Negative.   HENT: Negative.    Respiratory:  Positive for shortness of breath.   Cardiovascular:  Positive for palpitations.  Gastrointestinal: Negative.   Musculoskeletal: Negative.   Neurological: Negative.   Psychiatric/Behavioral: Negative.    All other systems reviewed and are negative.   PHYSICAL EXAM: VS:  BP 130/62 (BP Location: Left Arm, Patient Position: Sitting, Cuff Size: Normal)   Pulse 68   Ht '5\' 4"'$  (1.626 m)   Wt 208 lb 8 oz (94.6 kg)   SpO2 93%   BMI 35.79 kg/m  , BMI Body mass index is 35.79 kg/m. Constitutional:  oriented  to person, place, and time. No distress.  HENT:  Head: Grossly normal Eyes:  no discharge. No scleral icterus.  Neck: No JVD, no carotid bruits  Cardiovascular: Regular rate and rhythm, no murmurs appreciated Pulmonary/Chest: Clear to auscultation bilaterally, no wheezes or rails Abdominal: Soft.  no distension.  no tenderness.  Musculoskeletal: Normal range of motion Neurological:  normal muscle tone. Coordination normal. No atrophy Skin: Skin warm and dry Psychiatric: normal affect, pleasant  Recent Labs: 06/17/2022: TSH 1.23 12/20/2022: ALT 28; BUN 12; Creatinine, Ser 0.63; Hemoglobin 14.6; Platelets 144.0; Potassium 4.1; Sodium 137    Lipid Panel Lab Results  Component Value Date   CHOL 119 12/20/2022   HDL 44.70 12/20/2022   LDLCALC 53 12/20/2022   TRIG 106.0 12/20/2022    Wt Readings from Last 3 Encounters:  12/24/22 208 lb 8 oz (94.6 kg)  10/21/22 207 lb (93.9 kg)  10/11/22 206 lb 9.6 oz (93.7 kg)     ASSESSMENT AND PLAN:  Problem List Items Addressed This Visit       Cardiology Problems   Essential hypertension, benign   Hypercholesterolemia   CAD (coronary artery disease) - Primary   Relevant Orders   EKG 12-Lead   Carotid artery disease ([place])   Relevant Orders   EKG 12-Lead   Aortic atherosclerosis (HCC)    Relevant Orders   EKG 12-Lead   Paroxysmal A-fib (HCC)   PSVT (paroxysmal supraventricular tachycardia)   Relevant Orders   EKG 12-Lead   Other Visit Diagnoses     Smoker          Near syncope No recent symptoms, blood pressure stable  PAF NSR today On eliquis, long discussion concerning other options given the price of Eliquis Zio monitor 3% atrial fib, episodes of SVT Tolerating propranolol ER 80 twice daily  Smoker We have encouraged her to continue to work on weaning her cigarettes and smoking cessation. She will continue to work on this and does not want any assistance with chantix.    Coronary disease with stable angina Currently with no symptoms of angina. No further workup at this time. Continue current medication regimen. Prescription for nitro provided and recommended if she started to use these for chest pain that she call our office   Total encounter time more than 30 minutes  Greater than 50% was spent in counseling and coordination of care with the patient    Signed, Esmond Plants, M.D., Ph.D. [place], [place]

## 2022-12-24 ENCOUNTER — Ambulatory Visit: Payer: Medicare HMO | Attending: Cardiovascular Disease | Admitting: Cardiovascular Disease

## 2022-12-24 ENCOUNTER — Encounter: Payer: Self-pay | Admitting: Cardiovascular Disease

## 2022-12-24 ENCOUNTER — Telehealth: Payer: Self-pay

## 2022-12-24 VITALS — BP 130/62 | HR 68 | Ht 64.0 in | Wt 208.5 lb

## 2022-12-24 DIAGNOSIS — I10 Essential (primary) hypertension: Secondary | ICD-10-CM | POA: Diagnosis not present

## 2022-12-24 DIAGNOSIS — I480 Paroxysmal atrial fibrillation: Secondary | ICD-10-CM | POA: Diagnosis not present

## 2022-12-24 DIAGNOSIS — I25118 Atherosclerotic heart disease of native coronary artery with other forms of angina pectoris: Secondary | ICD-10-CM | POA: Diagnosis not present

## 2022-12-24 DIAGNOSIS — I700 Atherosclerosis of aorta: Secondary | ICD-10-CM

## 2022-12-24 DIAGNOSIS — I471 Supraventricular tachycardia, unspecified: Secondary | ICD-10-CM

## 2022-12-24 DIAGNOSIS — I6523 Occlusion and stenosis of bilateral carotid arteries: Secondary | ICD-10-CM | POA: Diagnosis not present

## 2022-12-24 DIAGNOSIS — F17200 Nicotine dependence, unspecified, uncomplicated: Secondary | ICD-10-CM | POA: Diagnosis not present

## 2022-12-24 DIAGNOSIS — E7800 Pure hypercholesterolemia, unspecified: Secondary | ICD-10-CM

## 2022-12-24 MED ORDER — NITROGLYCERIN 0.4 MG SL SUBL: 0.4000 mg | SUBLINGUAL_TABLET | SUBLINGUAL | 3 refills | Status: DC | PRN

## 2022-12-24 NOTE — Telephone Encounter (Signed)
Patient rescheduled for 2/12. Holding labs until appt.

## 2022-12-24 NOTE — Patient Instructions (Addendum)
Medication Instructions:  NTG for chest pain  Check price of xarelto 20 mg daily instead of eliquis  If you need a refill on your cardiac medications before your next appointment, please call your pharmacy.   Lab work: No new labs needed  Testing/Procedures: No new testing needed  Follow-Up: At [facility], you and your health needs are our priority.  As part of our continuing mission to provide you with exceptional heart care, we have created designated Provider Care Teams.  These Care Teams include your primary Cardiologist (physician) and Advanced Practice Providers (APPs -  Physician Assistants and Nurse Practitioners) who all work together to provide you with the care you need, when you need it.  You will need a follow up appointment in 12 months  Providers on your designated Care Team:   Murray Hodgkins, NP Christell Faith, PA-C Cadence Kathlen Mody, [place]  COVID-19 Vaccine Information can be found at: [email or web address] For questions related to vaccine distribution or appointments, please email [email or web address] or call 6175565499.

## 2022-12-24 NOTE — Telephone Encounter (Signed)
Patient states she needs to know how many shingles vaccines she has had.  Patient states she just had her labs drawn and Dr. Einar Pheasant did not want her to have to wait until 02/13/2023 to have her physical.  I was unable to reach Roetta Sessions, CMA , at the time of the call regarding scheduling.

## 2022-12-25 ENCOUNTER — Telehealth: Payer: Self-pay | Admitting: Cardiovascular Disease

## 2022-12-25 DIAGNOSIS — I480 Paroxysmal atrial fibrillation: Secondary | ICD-10-CM

## 2022-12-25 NOTE — Telephone Encounter (Signed)
New Message:    Patient said she saw Dr Rockey Situ yesterday. She said they had discussed changing from Eliquis to Xarelto, Because of the cost of it. She wants Dr Rockey Situ to know that she decided to stay on Eliquis, because Xarelto cost the same amount.      Pt c/o medication issue:  1. Name of Medication: Eliquis  2. How are you currently taking this medication (dosage and times per day)?   3. Are you having a reaction (difficulty breathing--STAT)?   4. What is your medication issue? Patient have decided to stay on Eliuis instead of switching to Xarelto

## 2022-12-25 NOTE — Telephone Encounter (Signed)
Pt called stating she checked with her insurance and xarelto would cost the same. Pt state she would prefer to stay on Eliquis.  Will forward to MD to make aware.  Pt also reported she need a new refill sent to [facility] but not until tomorrow as that's when her new insurance kick in. Will forward to Pharm D.

## 2022-12-26 MED ORDER — APIXABAN 5 MG PO TABS: 5.0000 mg | ORAL_TABLET | Freq: Two times a day (BID) | ORAL | 1 refills | Status: DC

## 2022-12-26 NOTE — Telephone Encounter (Signed)
Refill sent in

## 2022-12-27 ENCOUNTER — Other Ambulatory Visit: Payer: Self-pay

## 2022-12-27 MED ORDER — TRIMETHOPRIM 100 MG PO TABS: 100.0000 mg | ORAL_TABLET | Freq: Every day | ORAL | 3 refills | Status: DC

## 2022-12-27 MED ORDER — GEMTESA 75 MG PO TABS: 75.0000 mg | ORAL_TABLET | Freq: Every day | ORAL | 3 refills | Status: DC

## 2022-12-30 ENCOUNTER — Other Ambulatory Visit: Payer: Self-pay

## 2022-12-30 ENCOUNTER — Ambulatory Visit: Payer: Medicare Other | Admitting: Physician Assistant

## 2022-12-30 ENCOUNTER — Telehealth: Payer: Self-pay | Admitting: Internal Medicine

## 2022-12-30 ENCOUNTER — Telehealth: Payer: Self-pay

## 2022-12-30 VITALS — BP 149/79 | HR 76

## 2022-12-30 DIAGNOSIS — N3281 Overactive bladder: Secondary | ICD-10-CM | POA: Diagnosis not present

## 2022-12-30 DIAGNOSIS — E1159 Type 2 diabetes mellitus with other circulatory complications: Secondary | ICD-10-CM

## 2022-12-30 LAB — MICROSCOPIC EXAMINATION

## 2022-12-30 LAB — URINALYSIS, COMPLETE
Bilirubin, UA: NEGATIVE
Glucose, UA: NEGATIVE
Ketones, UA: NEGATIVE
Leukocytes,UA: NEGATIVE
Nitrite, UA: NEGATIVE
Protein,UA: NEGATIVE
RBC, UA: NEGATIVE
Specific Gravity, UA: 1.01 (ref 1.005–1.030)
Urobilinogen, Ur: 0.2 mg/dL (ref 0.2–1.0)
pH, UA: 6 (ref 5.0–7.5)

## 2022-12-30 LAB — BLADDER SCAN AMB NON-IMAGING: Scan Result: 12

## 2022-12-30 MED ORDER — ROSUVASTATIN CALCIUM 20 MG PO TABS: 20.0000 mg | ORAL_TABLET | Freq: Every day | ORAL | 10 refills | Status: DC

## 2022-12-30 MED ORDER — AMLODIPINE BESYLATE 10 MG PO TABS: 10.0000 mg | ORAL_TABLET | Freq: Every day | ORAL | 3 refills | Status: DC

## 2022-12-30 MED ORDER — FAMOTIDINE 40 MG PO TABS: 40.0000 mg | ORAL_TABLET | Freq: Every day | ORAL | 3 refills | Status: DC

## 2022-12-30 MED ORDER — PANTOPRAZOLE SODIUM 40 MG PO TBEC: DELAYED_RELEASE_TABLET | ORAL | 1 refills | Status: DC

## 2022-12-30 MED ORDER — ISOSORBIDE MONONITRATE ER 30 MG PO TB24: 30.0000 mg | ORAL_TABLET | Freq: Every day | ORAL | 3 refills | Status: DC

## 2022-12-30 NOTE — Telephone Encounter (Signed)
Pt called in asking for Larena Glassman to call her back concerning another med that need to be send over to [facility]. She did not provide me with med name. She's available '@[phone].

## 2022-12-30 NOTE — Telephone Encounter (Signed)
Prescriptions sent to [facility] per patient request.

## 2022-12-30 NOTE — Telephone Encounter (Signed)
Medication management - Telephone call with pt, after she left a message she wanted Dr. Shea Evans know to expect a future new order fax from [facility] as she is changing to [facility] from [facility] and they will be notifying her for pharmacy change.  Collateral reported this change went into effect on 12/26/22 as she still had [employer] up until that time.  Patient stated she had gotten notices that her therapy recent visits would not be covered due to some "paperwork had been turned in wrong" and agreed to send this concern to our billing staff to review.  Patient to call back if any other issues.

## 2022-12-30 NOTE — Telephone Encounter (Signed)
Pt called stating she has changed pharmacies and the pharmacy has to have her prescriptions. Pt stated do not send in telmisartan
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
## 2022-12-30 NOTE — Telephone Encounter (Signed)
Noted! Thank you

## 2022-12-31 ENCOUNTER — Other Ambulatory Visit: Payer: Self-pay

## 2022-12-31 DIAGNOSIS — G4733 Obstructive sleep apnea (adult) (pediatric): Secondary | ICD-10-CM | POA: Diagnosis not present

## 2022-12-31 MED ORDER — CONTINUOUS BLOOD GLUC RECEIVER DEVI: 0 refills | Status: AC

## 2022-12-31 MED ORDER — DEXCOM G7 SENSOR MISC: 3 refills | Status: DC

## 2022-12-31 NOTE — Addendum Note (Signed)
Addended by: Roetta Sessions D on: 12/31/2022 01:46 PM   Modules accepted: Orders

## 2022-12-31 NOTE — Telephone Encounter (Signed)
Ok

## 2022-12-31 NOTE — Telephone Encounter (Signed)
Patient stated that she checked with her new insurance and they cover the dexcom meter. Ok to send in for her? She says she does not want to stick her finger anymore.

## 2022-12-31 NOTE — Telephone Encounter (Signed)
Glucometer and sensor sent in

## 2023-01-01 ENCOUNTER — Telehealth: Payer: Self-pay | Admitting: *Deleted

## 2023-01-01 NOTE — Telephone Encounter (Signed)
-----   Message from Debroah Loop, [place] sent at 12/30/2022 11:37 AM EST ----- Regarding: Repeat Botox Needed Hi!  Can you please set her up for her next 6 month Botox? Last one with Dr. Matilde Sprang was on 11/27. Please note her insurance has changed in the interim.  Thanks! Sam

## 2023-01-02 DIAGNOSIS — Z79899 Other long term (current) drug therapy: Secondary | ICD-10-CM | POA: Diagnosis not present

## 2023-01-02 DIAGNOSIS — F17200 Nicotine dependence, unspecified, uncomplicated: Secondary | ICD-10-CM | POA: Diagnosis not present

## 2023-01-02 NOTE — Telephone Encounter (Signed)
Sent to [facility] go for benefit verification.

## 2023-01-06 ENCOUNTER — Encounter: Payer: Self-pay | Admitting: Internal Medicine

## 2023-01-06 ENCOUNTER — Ambulatory Visit (INDEPENDENT_AMBULATORY_CARE_PROVIDER_SITE_OTHER): Payer: Medicare Other | Admitting: Internal Medicine

## 2023-01-06 VITALS — BP 132/70 | HR 72 | Temp 98.0°F | Resp 16 | Ht 64.0 in | Wt 210.2 lb

## 2023-01-06 DIAGNOSIS — F330 Major depressive disorder, recurrent, mild: Secondary | ICD-10-CM

## 2023-01-06 DIAGNOSIS — I10 Essential (primary) hypertension: Secondary | ICD-10-CM | POA: Diagnosis not present

## 2023-01-06 DIAGNOSIS — I779 Disorder of arteries and arterioles, unspecified: Secondary | ICD-10-CM

## 2023-01-06 DIAGNOSIS — I700 Atherosclerosis of aorta: Secondary | ICD-10-CM

## 2023-01-06 DIAGNOSIS — Z0000 Encounter for general adult medical examination without abnormal findings: Secondary | ICD-10-CM

## 2023-01-06 DIAGNOSIS — E7800 Pure hypercholesterolemia, unspecified: Secondary | ICD-10-CM

## 2023-01-06 DIAGNOSIS — N3281 Overactive bladder: Secondary | ICD-10-CM

## 2023-01-06 DIAGNOSIS — K219 Gastro-esophageal reflux disease without esophagitis: Secondary | ICD-10-CM

## 2023-01-06 DIAGNOSIS — J849 Interstitial pulmonary disease, unspecified: Secondary | ICD-10-CM

## 2023-01-06 DIAGNOSIS — R221 Localized swelling, mass and lump, neck: Secondary | ICD-10-CM

## 2023-01-06 DIAGNOSIS — M542 Cervicalgia: Secondary | ICD-10-CM

## 2023-01-06 DIAGNOSIS — I2510 Atherosclerotic heart disease of native coronary artery without angina pectoris: Secondary | ICD-10-CM

## 2023-01-06 DIAGNOSIS — G4733 Obstructive sleep apnea (adult) (pediatric): Secondary | ICD-10-CM

## 2023-01-06 DIAGNOSIS — I480 Paroxysmal atrial fibrillation: Secondary | ICD-10-CM

## 2023-01-06 DIAGNOSIS — J449 Chronic obstructive pulmonary disease, unspecified: Secondary | ICD-10-CM

## 2023-01-06 DIAGNOSIS — F17200 Nicotine dependence, unspecified, uncomplicated: Secondary | ICD-10-CM

## 2023-01-06 DIAGNOSIS — E1159 Type 2 diabetes mellitus with other circulatory complications: Secondary | ICD-10-CM | POA: Diagnosis not present

## 2023-01-06 NOTE — Assessment & Plan Note (Signed)
Physical today 01/06/23.  Mammogram 05/08/22 - birads I.  Colonoscopy 03/30/20 - normal.

## 2023-01-06 NOTE — Telephone Encounter (Signed)
Spoke with patient and advised $15.00 for Botox, no prior auth needed per benefits verification ( scanned in chart)  Lab scheduled for UA/UCX Botox scheduled Will send in cipro per macdiarmid.
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
## 2023-01-08 ENCOUNTER — Encounter: Payer: Self-pay | Admitting: Internal Medicine

## 2023-01-08 NOTE — Telephone Encounter (Signed)
We discussed her smoking here and she was doing well.  I would like to hold off on chantix - given the possible side effects which include - sleep issues, vivid dreams, suicidal thoughts, etc.  Given she is doing well, would like to hold on chantix. Let me know if any problems.

## 2023-01-08 NOTE — Telephone Encounter (Signed)
Pt called and I read the message to her and she stated did she mention anything about the covering for sugar reader

## 2023-01-08 NOTE — Telephone Encounter (Signed)
LM for pt to cb 

## 2023-01-09 ENCOUNTER — Other Ambulatory Visit: Payer: Self-pay

## 2023-01-09 NOTE — Telephone Encounter (Signed)
Pt aware.

## 2023-01-09 NOTE — Telephone Encounter (Signed)
Unsure of exactly what to order - will figure out w/ [facility]

## 2023-01-12 ENCOUNTER — Encounter: Payer: Self-pay | Admitting: Internal Medicine

## 2023-01-12 NOTE — Assessment & Plan Note (Addendum)
On ozempic.  Has been doing well overall with this medication.  Does report some GI issues as outlined.  Discussed benefiber.  Wants to change to mounjaro.  Just received shipment of ozempic.  Low carb diet and exercise.  Follow met b and a1c.  Medication management referral - to help with medications, etc.

## 2023-01-12 NOTE — Assessment & Plan Note (Signed)
Ortho (Dr Roland Rack) - 08/26/22 - PT, tramadol and zanaflex.

## 2023-01-12 NOTE — Assessment & Plan Note (Signed)
S/p stent placement.  Continue lipitor. Followed by cardiology.  On eliquis.

## 2023-01-12 NOTE — Assessment & Plan Note (Signed)
No upper symptoms reported.  On protonix.   

## 2023-01-12 NOTE — Assessment & Plan Note (Signed)
Breztri.  Breathing stable.

## 2023-01-12 NOTE — Assessment & Plan Note (Signed)
Had televisit 01/02/23 - regarding smoking cessation.  Utilizing nicotine patch and nicotine gum.  Referred for behavioral counseling for smoking cessation.  Is doing well with this.  Has decreased cigarettes per day.

## 2023-01-12 NOTE — Assessment & Plan Note (Signed)
Sees Dr Raul Del.  Using breztri.  Breathing stable.  No increased sob, cough or congestion. Dr Raul Del recommended f/u chest CT in 02/2023.

## 2023-01-12 NOTE — Assessment & Plan Note (Signed)
Continue micardis 46m q day and amlodipine 146mq day.  Dr GoRockey Situncreased propranolol. Blood pressures have been under reasonable control. Follow pressures.  Follow metabolic panel. Is having some pedal and ankle swelling.  Has been on amlodipine 1065mor a while.  Hold on making changes.  Leg elevation.  Follow.

## 2023-01-12 NOTE — Assessment & Plan Note (Signed)
Kathryn Friedman - 09/2022 - new cpap

## 2023-01-12 NOTE — Assessment & Plan Note (Signed)
Continue lipitor  ?

## 2023-01-12 NOTE — Assessment & Plan Note (Signed)
On eliquis.  Stable.  Continue f/u with cardiology.

## 2023-01-12 NOTE — Assessment & Plan Note (Signed)
Discussed with her regarding increased fullness.  Discussed CT scan.  In reviewing, just had CT 05/2022.  See if agreeable for ENT referral.

## 2023-01-12 NOTE — Assessment & Plan Note (Signed)
Being followed by psychiatry.  On cymbalta.  Increased depression related to recent passing of her husband. Overall appears to be doing better.  Follow.

## 2023-01-12 NOTE — Assessment & Plan Note (Signed)
Continue lipitor.  Continue eliquis.

## 2023-01-12 NOTE — Assessment & Plan Note (Signed)
Improvement in urinary symptoms after botox.  Also on gemtesa and trimethoprim

## 2023-01-12 NOTE — Assessment & Plan Note (Signed)
On crestor.  Low cholesterol diet and exercise.  Follow lipid panel and liver function tests.   

## 2023-01-14 ENCOUNTER — Telehealth: Payer: Self-pay

## 2023-01-14 NOTE — Telephone Encounter (Signed)
pt called states that she needed a refill on the ropinirole .61m . pt was told that a 90 day supply was sent to the pharamacy in january. I will call and see if they still have and if they can go ahead and fill medication.

## 2023-01-14 NOTE — Telephone Encounter (Signed)
[facility] does have rx on hold and they will go ahead a fill medication.

## 2023-01-18 ENCOUNTER — Encounter: Payer: Self-pay | Admitting: Internal Medicine

## 2023-01-21 ENCOUNTER — Other Ambulatory Visit: Payer: Self-pay

## 2023-01-21 DIAGNOSIS — R221 Localized swelling, mass and lump, neck: Secondary | ICD-10-CM

## 2023-01-21 NOTE — Telephone Encounter (Signed)
Please call pt.  Let her know that in reviewing - she had a CT scan of her neck in 05/2023.  Given she just had this over the summer, I would like to refer to ENT for further evaluation.  (They may want her to do another scan, but I would like to get their input since she just had CT neck in July).  Let me know if a problem.  If agreeable, would like ENT appt asap.

## 2023-01-26 ENCOUNTER — Encounter: Payer: Self-pay | Admitting: Internal Medicine

## 2023-01-27 NOTE — Telephone Encounter (Signed)
  S/w pt - only using "Diurex" , 2 pills per day. Pt stated both feet and legs are sore, go down slightly at night, red. Not hot to touch , no weeping.   BP's running well -  121/64 THIS AM 109/62 LAST NIGHT 129/69 YESTERDAY MORNING

## 2023-01-27 NOTE — Telephone Encounter (Signed)
Please call her and confirm - what two diuretics she is taking - as her note states.  Also, see how her blood pressures are doing now.  The amlodipine '10mg'$  may be contributing to her increased swelling.  May need to adjust blood pressure medication.

## 2023-01-27 NOTE — Telephone Encounter (Signed)
Would hold off taking otc diurex.  Have her decrease her amlodipine to '5mg'$  q day.  I feel the '10mg'$  of amlodipine is contributing to the swelling.  Follow blood pressures.  May need to adjust blood pressure medication more if bp increases.

## 2023-01-27 NOTE — Telephone Encounter (Signed)
Patient is aware of below. She will call with update or send my chart message.

## 2023-01-28 ENCOUNTER — Encounter: Payer: Self-pay | Admitting: Internal Medicine

## 2023-01-28 NOTE — Telephone Encounter (Signed)
Please call her and let her know to wait until Sunday to take.  Thanks.

## 2023-01-28 NOTE — Telephone Encounter (Signed)
Pt advised.

## 2023-02-02 ENCOUNTER — Encounter: Payer: Self-pay | Admitting: Internal Medicine

## 2023-02-03 NOTE — Telephone Encounter (Signed)
Please notify her that her blood sugars look good.  Blood pressures ok.  Some variation, but overall ok  if agreeable, see is she can come in tomorrow, so that I can evaluate her legs and see what is the best treatment.

## 2023-02-03 NOTE — Telephone Encounter (Signed)
Patient aware of below. She declined appointment and ended phone call.

## 2023-02-06 ENCOUNTER — Telehealth (INDEPENDENT_AMBULATORY_CARE_PROVIDER_SITE_OTHER): Payer: Medicare Other | Admitting: Psychiatry

## 2023-02-06 ENCOUNTER — Encounter: Payer: Self-pay | Admitting: Psychiatry

## 2023-02-06 DIAGNOSIS — Z634 Disappearance and death of family member: Secondary | ICD-10-CM

## 2023-02-06 DIAGNOSIS — G2581 Restless legs syndrome: Secondary | ICD-10-CM | POA: Diagnosis not present

## 2023-02-06 DIAGNOSIS — F17200 Nicotine dependence, unspecified, uncomplicated: Secondary | ICD-10-CM

## 2023-02-06 DIAGNOSIS — F3342 Major depressive disorder, recurrent, in full remission: Secondary | ICD-10-CM | POA: Diagnosis not present

## 2023-02-06 DIAGNOSIS — F17210 Nicotine dependence, cigarettes, uncomplicated: Secondary | ICD-10-CM

## 2023-02-06 DIAGNOSIS — F418 Other specified anxiety disorders: Secondary | ICD-10-CM

## 2023-02-06 DIAGNOSIS — G4701 Insomnia due to medical condition: Secondary | ICD-10-CM | POA: Diagnosis not present

## 2023-02-06 NOTE — Progress Notes (Signed)
Virtual Visit via Video Note  I connected with Kathryn Friedman on 02/06/23 at  4:30 PM EDT by a video enabled telemedicine application and verified that I am speaking with the correct person using two identifiers.  Location Provider Location : [facility] Patient Location : Home  Participants: Patient , Provider    I discussed the limitations of evaluation and management by telemedicine and the availability of in person appointments. The patient expressed understanding and agreed to proceed.   I discussed the assessment and treatment plan with the patient. The patient was provided an opportunity to ask questions and all were answered. The patient agreed with the plan and demonstrated an understanding of the instructions.   The patient was advised to call back or seek an in-person evaluation if the symptoms worsen or if the condition fails to improve as anticipated.   Kathryn Friedman Progress Note  02/07/2023 8:34 AM Kathryn Friedman  MRN:  [id number]  Chief Complaint:  Chief Complaint  Patient presents with   Follow-up   Depression   Anxiety   Grief   Medication Refill   HPI: Kathryn Friedman is a 67 year old Caucasian female, widowed, retired, on [employer], lives in [place], has a history of MDD, insomnia, obstructive sleep apnea on CPAP, status post eye surgery, history of coronary artery disease status post stent placement, COPD, diabetes mellitus, hypertension, GERD, interstitial lung disease, iron deficiency was evaluated by telemedicine today.  Patient today reports she is currently doing fairly well.  Denies any significant grief or depression symptoms.  She reports she has been spending a lot of time doing her surveys online.  She also has support from her sister who recently moved from [facility] to [place].  She was able to spend some time with her sister today.  They have a very good relationship and that has been beneficial.  She has a friend who comes in to help her also.  Patient  reports sleep is overall good.Patient is currently compliant on her CPAP.  Patient denies any suicidality, homicidality or perceptual disturbances.  Patient is compliant on her medications, denies side effects.  Patient denies any other concerns today.  Visit Diagnosis:    ICD-10-CM   1. MDD (major depressive disorder), recurrent, in full remission ([place])  F33.42     2. Insomnia due to medical condition  G47.01    OSA, RLS    3. RLS (restless legs syndrome)  G25.81     4. Other specified anxiety disorders  F41.8    Limited symptom attacks    5. Bereavement  Z63.4     6. Tobacco use disorder  F17.200       Past Psychiatric History: I have reviewed past psychiatric history from progress note on 06/26/2020.  Past trials of trazodone, Cymbalta, Lexapro, Rexulti, doxepin, gabapentin, Lunesta.  Patient was previously under the care of [employer].  Past Medical History:  Past Medical History:  Diagnosis Date   Anemia    Anginal pain (HCC)    Anxiety    Arthritis    back and knees   Asthma    Bilateral carotid artery stenosis    Blood in stool    Chronic diarrhea    COPD (chronic obstructive pulmonary disease) (HCC)    Coronary artery disease    a.) 75% pRCA; 3.5 x 28 mm Cypher DES placed on 07/24/2006   Current use of long term anticoagulation    Clopidogrel   Depression    secondary  to the death of her husband (died 36)   Diverticulitis    Diverticulosis    Dizzinesses    Dysphagia    Dyspnea    Dysrhythmia    Fatty infiltration of liver    GERD (gastroesophageal reflux disease)    Headache    History of 2019 novel coronavirus disease (COVID-19) 12/09/2020   History of 2019 novel coronavirus disease (COVID-19) 12/20/2020   Hypertension    Hypertriglyceridemia    ILD (interstitial lung disease) ([place])    Lump in the abdomen    OSA on CPAP    Overactive bladder    PSVT (paroxysmal supraventricular tachycardia)    Spastic colon    T2DM (type 2  diabetes mellitus) ([place]) 05/2008   Tobacco abuse    Venous insufficiency of both lower extremities     Past Surgical History:  Procedure Laterality Date   ABDOMINAL HYSTERECTOMY  with left ovary in place [place]   gallbladder and Appendix   BREAST BIOPSY Left 10/14/2017   calcs bx, fibrosis giant cell reaction and chronic inflammation, negative for malignancy.    CARPOMETACARPAL ([facility]) FUSION OF THUMB Right 07/10/2022   Procedure: CARPOMETACARPAL ([place]) SUSPENSION OF RIGHT THUMB;  Surgeon: Corky Mull, MD;  Location: [facility] ORS;  Service: Orthopedics;  Laterality: Right;   CATARACT EXTRACTION, BILATERAL     CESAREAN SECTION  1984   CHOLECYSTECTOMY  1985   COLONOSCOPY WITH PROPOFOL N/A 09/13/2016   Procedure: COLONOSCOPY WITH PROPOFOL;  Surgeon: Manya Silvas, MD;  Location: [facility] ENDOSCOPY;  Service: Endoscopy;  Laterality: N/A;   COLONOSCOPY WITH PROPOFOL N/A 11/09/2018   Procedure: COLONOSCOPY WITH PROPOFOL;  Surgeon: Manya Silvas, MD;  Location: [facility] ENDOSCOPY;  Service: Endoscopy;  Laterality: N/A;   COLONOSCOPY WITH PROPOFOL N/A 03/29/2020   Procedure: COLONOSCOPY WITH PROPOFOL;  Surgeon: Robert Bellow, MD;  Location: [facility] ENDOSCOPY;  Service: Endoscopy;  Laterality: N/A;   CORONARY ANGIOPLASTY WITH STENT PLACEMENT N/A 07/24/2006   75% pRCA; 3.5 x 28 mm Cypher DES placed; Location: [place]; Surgeons: Katrine Coho, MD   ESOPHAGOGASTRODUODENOSCOPY (EGD) WITH PROPOFOL N/A 02/02/2018   Procedure: ESOPHAGOGASTRODUODENOSCOPY (EGD) WITH PROPOFOL;  Surgeon: Manya Silvas, MD;  Location: [facility] ENDOSCOPY;  Service: Endoscopy;  Laterality: N/A;   ESOPHAGOGASTRODUODENOSCOPY (EGD) WITH PROPOFOL N/A 03/29/2020   Procedure: ESOPHAGOGASTRODUODENOSCOPY (EGD) WITH PROPOFOL;  Surgeon: Robert Bellow, MD;  Location: [facility] ENDOSCOPY;  Service: Endoscopy;  Laterality: N/A;   EYE SURGERY     JOINT REPLACEMENT     bilateral knee replacements   KNEE ARTHROSCOPY   Arthroscopic left knee surgery    KNEE SURGERY  status post knee surgey    LEFT HEART CATH AND CORONARY ANGIOGRAPHY Left 05/14/2018   Procedure: LEFT HEART CATH AND CORONARY ANGIOGRAPHY;  Surgeon: Corey Skains, MD;  Location: [place] [facility] LAB;  Service: Cardiovascular;  Laterality: Left;   REPLACEMENT TOTAL KNEE  ([facility])   SHOULDER SURGERY  shoulder operation secondary to a torn tendon   TOTAL HIP ARTHROPLASTY Left 06/12/2021   Procedure: TOTAL HIP ARTHROPLASTY;  Surgeon: Corky Mull, MD;  Location: [facility] ORS;  Service: Orthopedics;  Laterality: Left;    Family Psychiatric History: Reviewed family psychiatric history from progress note on 06/26/2020.  Family History:  Family History  Problem Relation Age of Onset   Other Mother        Hit by a fire truck and has had multiple operations on her back , and has history of MVP  Mitral valve prolapse Mother    Lung cancer Mother    Depression Mother    Heart disease Father        myocardial infarction and is status post bypass surgery   Mitral valve prolapse Sister    Bipolar disorder Sister    Hepatitis C Brother    Cirrhosis Brother    Colon cancer Paternal Aunt    Breast cancer Neg Hx    Prostate cancer Neg Hx    Bladder Cancer Neg Hx    Kidney cancer Neg Hx     Social History: Reviewed social history from progress note on 06/26/2020. Social History   Socioeconomic History   Marital status: Widowed    Spouse name: Avlynn Rahaman   Number of children: 1   Years of education: 12   Highest education level: 12th grade  Occupational History    Employer: [employer]  Tobacco Use   Smoking status: Every Day    Packs/day: 2.00    Years: 45.00    Additional pack years: 0.00    Total pack years: 90.00    Types: Cigarettes    Passive exposure: Current   Smokeless tobacco: Never   Tobacco comments:    Smokes 18 cigarettes every day 12/24/22  Vaping Use   Vaping Use: Former   Substances: Flavoring  Substance and Sexual Activity    Alcohol use: No    Alcohol/week: 0.0 standard drinks of alcohol   Drug use: No   Sexual activity: Not Currently  Other Topics Concern   Not on file  Social History Narrative   Not on file   Social Determinants of Health   Financial Resource Strain: Medium Risk (01/11/2022)   Overall Financial Resource Strain (CARDIA)    Difficulty of Paying Living Expenses: Somewhat hard  Food Insecurity: No Food Insecurity (05/03/2022)   Hunger Vital Sign    Worried About Running Out of Food in the Last Year: Never true    Ran Out of Food in the Last Year: Never true  Transportation Needs: No Transportation Needs (05/03/2022)   PRAPARE - Hydrologist (Medical): No    Lack of Transportation (Non-Medical): No  Physical Activity: Unknown (05/03/2022)   Exercise Vital Sign    Days of Exercise per Week: 0 days    Minutes of Exercise per Session: Not on file  Recent Concern: Physical Activity - Inactive (05/03/2022)   Exercise Vital Sign    Days of Exercise per Week: 0 days    Minutes of Exercise per Session: 0 min  Stress: No Stress Concern Present (05/03/2022)   [place]    Feeling of Stress : Not at all  Social Connections: Moderately Isolated (05/03/2022)   Social Connection and Isolation Panel [NHANES]    Frequency of Communication with Friends and Family: More than three times a week    Frequency of Social Gatherings with Friends and Family: More than three times a week    Attends Religious Services: 1 to 4 times per year    Active Member of [employer] or Organizations: No    Attends Archivist Meetings: Never    Marital Status: Widowed    Allergies:  Allergies  Allergen Reactions   Varenicline Other (See Comments)    "I got really depressed" "I got really depressed" CHANTEX   Varenicline Tartrate    Jardiance [Empagliflozin] Other (See Comments)    Yeast infection   Metformin And Related  Other  (See Comments)    Diarrhea, even with XR   Methylprednisolone Nausea Only and Nausea And Vomiting    Metabolic Disorder Labs: Lab Results  Component Value Date   HGBA1C 6.2 12/20/2022   No results found for: "PROLACTIN" Lab Results  Component Value Date   CHOL 119 12/20/2022   TRIG 106.0 12/20/2022   HDL 44.70 12/20/2022   CHOLHDL 3 12/20/2022   VLDL 21.2 12/20/2022   LDLCALC 53 12/20/2022   LDLCALC 46 06/17/2022   Lab Results  Component Value Date   TSH 1.23 06/17/2022   TSH 0.87 07/06/2021    Therapeutic Level Labs: No results found for: "LITHIUM" No results found for: "VALPROATE" No results found for: "CBMZ"  Current Medications: Current Outpatient Medications  Medication Sig Dispense Refill   Accu-Chek Softclix Lancets lancets Use as instructed 100 each 12   albuterol (VENTOLIN HFA) 108 (90 Base) MCG/ACT inhaler INHALE 2 PUFFS FOUR TIMES A DAY 8.5 g 11   amLODipine (NORVASC) 10 MG tablet Take 1 tablet (10 mg total) by mouth daily. (Patient taking differently: Take 5 mg by mouth daily.) 90 tablet 3   apixaban (ELIQUIS) 5 MG TABS tablet Take 1 tablet (5 mg total) by mouth 2 (two) times daily. 180 tablet 1   Blood Glucose Monitoring Suppl (TRUE METRIX METER) w/Device KIT Use as directed twice a day to check sugars 1 kit 0   Budeson-Glycopyrrol-Formoterol (BREZTRI AEROSPHERE) 160-9-4.8 MCG/ACT AERO Inhale 2 puffs into the lungs in the morning and at bedtime. 32.1 g 3   CALCIUM PO Take 600 mg by mouth daily.      cholecalciferol (VITAMIN D3) 25 MCG (1000 UNIT) tablet Take 1,000 Units by mouth daily.     Continuous Blood Gluc Receiver DEVI Use as directed to check blood sugars daily 1 each 0   Continuous Blood Gluc Sensor (DEXCOM G7 SENSOR) MISC Apply 1 sensor every 10 days. 3 each 3   Cyanocobalamin (VITAMIN B-12 PO) Take 2,500 mcg by mouth daily.     DULoxetine (CYMBALTA) 60 MG capsule TAKE 1 CAPSULE EVERY DAY 90 capsule 1   famotidine (PEPCID) 40 MG tablet Take 1  tablet (40 mg total) by mouth daily. 90 tablet 3   HM MELATONIN PO 5 mg by Other route at bedtime as needed. patch     isosorbide mononitrate (IMDUR) 30 MG 24 hr tablet Take 1 tablet (30 mg total) by mouth daily. 90 tablet 3   Multiple Vitamin (MULTIVITAMIN) tablet Take 1 tablet by mouth daily.     mupirocin ointment (BACTROBAN) 2 % Apply 1 application. topically 2 (two) times daily. 22 g 0   nitroGLYCERIN (NITROSTAT) 0.4 MG SL tablet Place 1 tablet (0.4 mg total) under the tongue every 5 (five) minutes as needed for chest pain. 25 tablet 3   nystatin (MYCOSTATIN/NYSTOP) powder APPLY TO THE AFFECTED AREA(S) TWICE DAILY 30 g 0   pantoprazole (PROTONIX) 40 MG tablet TAKE 1 TABLET TWICE DAILY BEFORE MEALS 180 tablet 1   propranolol ER (INDERAL LA) 80 MG 24 hr capsule TAKE 1 CAPSULE TWICE DAILY 180 capsule 3   rOPINIRole (REQUIP) 0.5 MG tablet Take 1 tablet (0.5 mg total) by mouth daily after lunch. 90 tablet 0   rOPINIRole (REQUIP) 3 MG tablet Take 1 tablet (3 mg total) by mouth at bedtime. Take along with 0.5 mg daily, total of 3.5 mg daily. 90 tablet 0   rosuvastatin (CRESTOR) 20 MG tablet Take 1 tablet (20 mg total) by mouth daily.  90 tablet 10   Semaglutide,0.25 or 0.'5MG'$ /DOS, (OZEMPIC, 0.25 OR 0.5 MG/DOSE,) 2 MG/1.5ML SOPN Inject 0.25 mg weekly for 4 weeks then increase to 0.5 mg weekly (Patient taking differently: Inject 0.5 mg into the skin once a week. Inject 0.25 mg weekly for 4 weeks then increase to 0.5 mg weekly) 1.5 mL 2   telmisartan (MICARDIS) 80 MG tablet TAKE 1 TABLET EVERY DAY 90 tablet 1   traZODone (DESYREL) 100 MG tablet Take 1.5-2 tablets (150-200 mg total) by mouth at bedtime. 60 tablet 0   trimethoprim (TRIMPEX) 100 MG tablet Take 1 tablet (100 mg total) by mouth daily. 90 tablet 3   TRUE METRIX BLOOD GLUCOSE TEST test strip USE AS INSTRUCTED TO CHECK BLOOD SUGARS TWICE DAILY. 200 strip 10   Vibegron (GEMTESA) 75 MG TABS Take 1 tablet (75 mg total) by mouth daily. 90 tablet 3    No current facility-administered medications for this visit.     Musculoskeletal: Strength & Muscle Tone:  UTA Gait & Station:  Seated Patient leans: N/A  Psychiatric Specialty Exam: Review of Systems  Psychiatric/Behavioral:  Positive for sleep disturbance (improving).   All other systems reviewed and are negative.   There were no vitals taken for this visit.There is no height or weight on file to calculate BMI.  General Appearance: Casual  Eye Contact:  Fair  Speech:  Clear and Coherent  Volume:  Normal  Mood:  Euthymic  Affect:  Congruent  Thought Process:  Goal Directed and Descriptions of Associations: Intact  Orientation:  Full (Time, Place, and Person)  Thought Content: Logical   Suicidal Thoughts:  No  Homicidal Thoughts:  No  Memory:  Immediate;   Fair Recent;   Fair Remote;   Fair  Judgement:  Fair  Insight:  Fair  Psychomotor Activity:  Normal  Concentration:  Concentration: Fair and Attention Span: Fair  Recall:  [employer] of Knowledge: Fair  Language: Fair  Akathisia:  No  Handed:  Right  AIMS (if indicated): not done  Assets:  Communication Skills Desire for Improvement Housing Social Support  ADL's:  Intact  Cognition: WNL  Sleep:   improving   Screenings: Administrator, Civil Service Office Visit from 05/14/2022 in [place] Video Visit from 04/03/2022 in [place] Total Score 0 0      GAD-7    Flowsheet Row Counselor from 12/20/2022 in [place] Video Visit from 06/25/2021 in [place] Video Visit from 05/03/2021 in [place] Video Visit from 04/05/2021 in [place]  Total GAD-7 Score '6 6 3 9      '$ PHQ2-9    On[place] from 12/20/2022 in [place] Video Visit from 12/10/2022 in [place] Video Visit from 11/27/2022 in [place] Office Visit from 10/11/2022 in [place] at [facility] Video Visit from 08/30/2022 in [place]  PHQ-2 Total Score '1 1 6 2 '$ 0  PHQ-9 Total Score 8 -- 19 4 --      Flowsheet Row Video Visit from 02/06/2023 in [place] Counselor from 12/20/2022 in [place] Video Visit from 12/10/2022 in [place] No Risk  No Risk No Risk        Assessment and Plan: JOVEE DETTINGER is a 67 year old Caucasian female who has a history of MDD, anxiety disorder, bereavement, sleep problem was evaluated by telemedicine today.  Patient is currently stable.  Plan as noted below.  Plan MDD in full remission Cymbalta 60 mg p.o. daily Trazodone 150-200 mg p.o. daily Patient has upcoming appointment with our therapist [name].  Other specified anxiety disorder with limited symptom attack-improving Cymbalta 60 mg p.o. daily Continue CBT  Insomnia-improving CPAP for OSA Requip 3 mg at bedtime and 0.5 mg in the afternoon Trazodone 150-200 mg at bedtime  RLS-stable Requip as prescribed  Bereavement-improving Referred for CBT-patient has upcoming appointment.  Tobacco use disorder-improving Will monitor closely  Follow-up in clinic in 3 months or sooner if needed. Collaboration of Care: Collaboration of Care: Referral or follow-up with counselor/therapist AEB patient encouraged to keep her appointment with our therapist.  Patient/Guardian was advised Release of Information must be obtained prior to any record release in order to collaborate their care with an outside provider. Patient/Guardian was  advised if they have not already done so to contact the registration department to sign all necessary forms in order for [place] to release information regarding their care.   Consent: Patient/Guardian gives verbal consent for treatment and assignment of benefits for services provided during this visit. Patient/Guardian expressed understanding and agreed to proceed.   This note was generated in part or whole with voice recognition software. Voice recognition is usually quite accurate but there are transcription errors that can and very often do occur. I apologize for any typographical errors that were not detected and corrected.    Ursula Alert, MD 02/07/2023, 8:34 AM

## 2023-02-08 ENCOUNTER — Encounter: Payer: Self-pay | Admitting: Internal Medicine

## 2023-02-10 NOTE — Telephone Encounter (Signed)
How much ozempic is she taking.  If on a low dose, the dose can be adjusted.  If having issues with ozempic and wants to switch would need to stop ozempic and start mounjaro - 2.5mg  q week.

## 2023-02-11 ENCOUNTER — Telehealth: Payer: Self-pay | Admitting: Cardiovascular Disease

## 2023-02-11 DIAGNOSIS — R42 Dizziness and giddiness: Secondary | ICD-10-CM

## 2023-02-11 DIAGNOSIS — I480 Paroxysmal atrial fibrillation: Secondary | ICD-10-CM

## 2023-02-11 NOTE — Telephone Encounter (Signed)
Called patient. She has been on 0.5 mg weekly for probably a year. She is agreeable to increase her ozempic to try that first and if no success would like to switch to mounjaro.

## 2023-02-11 NOTE — Telephone Encounter (Signed)
Pt c/o swelling: STAT is pt has developed SOB within 24 hours  If swelling, where is the swelling located? Swelling in both legs and feet   How much weight have you gained and in what time span? About 4lbs in a couple of weeks.  Have you gained 3 pounds in a day or 5 pounds in a week? no  Do you have a log of your daily weights (if so, list)? no  Are you currently taking a fluid pill? no  Are you currently SOB? No more than usual   Have you traveled recently? No   Patient states her left foot is feeling numb.

## 2023-02-11 NOTE — Telephone Encounter (Signed)
Spoke w/ pt.  She reports b/l leg swelling, most recently sent MyChart message to PCP, Dr. Nicki Reaper on 01/26/23 requesting lasix, but was advised to d/c otc diurex and decrease amlodipine from 10 mg daily to 5 mg.  Her BPs from 2/23 - 3/9 are listed in message 02/02/23, readings for the past 3 days are as follows: 3/16: AM 136/69, 79  PM 144/70, 67 3/17:   AM 133/72, 64  PM 147/74, 80 3/19:  AM 148/79, 69  She had internet service tech out yesterday and did not check BP, wear compression hose or drink any water b/c of people in the house. She wears her compression hose daily, keeps her feet elevated when sitting, drinks about 3 glasses of water daily and limits her salt intake. Advised her that I will make Dr. Rockey Situ aware of her concerns and call her back w/ his recommendations.

## 2023-02-11 NOTE — Telephone Encounter (Signed)
Ok to increase to 1mg  per week.

## 2023-02-12 NOTE — Telephone Encounter (Signed)
Spoke with patient and advised that we are pending reply back from Dr. Rockey Situ. She verbalized understanding

## 2023-02-12 NOTE — Telephone Encounter (Signed)
Yes ok to use this supply and notify pharmacy of dose change.

## 2023-02-12 NOTE — Telephone Encounter (Signed)
Patient calling back for update. Please advise  

## 2023-02-12 NOTE — Telephone Encounter (Signed)
Patient is okay with increasing to the 1 mg q week but she has like 6 boxes of her current dose left. Can she take 2 injections of the 0.5 mg to use up what she has? She gets this through patient assistance so I will have to get in touch with pharmacy team to get her dose changed for next shipment.

## 2023-02-13 ENCOUNTER — Encounter: Payer: Medicare HMO | Admitting: Internal Medicine

## 2023-02-13 MED ORDER — ISOSORBIDE MONONITRATE ER 30 MG PO TB24: 30.0000 mg | ORAL_TABLET | Freq: Two times a day (BID) | ORAL | 3 refills | Status: DC

## 2023-02-13 NOTE — Telephone Encounter (Signed)
Noted  

## 2023-02-13 NOTE — Telephone Encounter (Signed)
Patient aware. New paperwork sent to [facility]. FYI for you- Pt talked to Dr Rockey Situ about the swelling in her feet/legs and also provided him with BP readings and his instructions were for her to continue her amlodipine at 5 mg q day and increase her Isosorbide to 30 mg BID. Nothing needed just wanted you to be aware.

## 2023-02-13 NOTE — Telephone Encounter (Signed)
Spoke with patient and informed her of Dr. Donivan Scull recommendations as follows:  Blood pressure mildly elevated  Would increase isosorbide up to 30 mg twice a day  Continue low-dose amlodipine 5  Amlodipine can be associated with leg swelling particularly at higher doses  We need baseline echocardiogram, not on record  Would let us know if swelling starts to improve on low-dose amlodipine  Might be able to go down to amlodipine 2.5 if blood pressure is well-controlled Imdur 30 twice a day  Will consider Lasix if no improvement in leg swelling as amlodipine weaned down  Thx  TGollan   Patient was accepting of treatment plan

## 2023-02-17 DIAGNOSIS — G4733 Obstructive sleep apnea (adult) (pediatric): Secondary | ICD-10-CM | POA: Diagnosis not present

## 2023-02-18 ENCOUNTER — Telehealth: Payer: Self-pay | Admitting: Psychiatry

## 2023-02-18 DIAGNOSIS — F331 Major depressive disorder, recurrent, moderate: Secondary | ICD-10-CM

## 2023-02-18 DIAGNOSIS — G2581 Restless legs syndrome: Secondary | ICD-10-CM

## 2023-02-18 DIAGNOSIS — G4701 Insomnia due to medical condition: Secondary | ICD-10-CM

## 2023-02-18 MED ORDER — ROPINIROLE HCL 3 MG PO TABS: 3.0000 mg | ORAL_TABLET | Freq: Every day | ORAL | 1 refills | Status: DC

## 2023-02-18 MED ORDER — TRAZODONE HCL 100 MG PO TABS: 150.0000 mg | ORAL_TABLET | Freq: Every day | ORAL | 1 refills | Status: DC

## 2023-02-18 MED ORDER — ROPINIROLE HCL 0.5 MG PO TABS: 0.5000 mg | ORAL_TABLET | Freq: Every day | ORAL | 1 refills | Status: DC

## 2023-02-18 NOTE — Telephone Encounter (Signed)
I have [facility] requ[facility].

## 2023-02-20 ENCOUNTER — Encounter: Payer: Self-pay | Admitting: Internal Medicine

## 2023-02-20 ENCOUNTER — Ambulatory Visit: Payer: Medicare HMO | Admitting: Licensed Clinical Social Worker

## 2023-02-20 NOTE — Telephone Encounter (Signed)
The blood sugars are overall ok.  The blood pressures are varying - but some higher than previous.  Continue to monitor.  Will follow overall trend. Let me know if I need to do anything regarding the form.

## 2023-02-24 ENCOUNTER — Telehealth: Payer: Self-pay

## 2023-02-24 ENCOUNTER — Ambulatory Visit: Payer: Medicare Other

## 2023-02-24 ENCOUNTER — Telehealth: Payer: Self-pay | Admitting: Psychiatry

## 2023-02-24 NOTE — Telephone Encounter (Signed)
pt left a message that she needs her refill of the ropinirole 3mg  [facility] and that [facility] they don't [facility] it

## 2023-02-24 NOTE — Telephone Encounter (Signed)
  left message that dr. Shea Evans sent rx on 02-18-23 @ 7:52. Advised [facility] to [facility] and see if they put medication on hold or if they have it in stock.     Disp Refills Start End   rOPINIRole (REQUIP) 3 MG tablet 90 tablet 1 02/18/2023    Sig - Route: Take 1 tablet (3 mg total) by mouth at bedtime. Take along with 0.5 mg daily, total of 3.5 mg daily. - Oral   Sent to pharmacy as: rOPINIRole (REQUIP) 3 MG tablet   E-Prescribing Status: Receipt confirmed by pharmacy (02/18/2023  7:52 AM EDT)

## 2023-02-24 NOTE — Telephone Encounter (Signed)
Received prescription clarification for Requip. Corrected clarification-given to Janett Billow CMA to fax.

## 2023-02-25 DIAGNOSIS — Z79899 Other long term (current) drug therapy: Secondary | ICD-10-CM | POA: Diagnosis not present

## 2023-02-25 NOTE — Telephone Encounter (Signed)
Rx form was faxed and confirmed. This was done yesterday 02-24-23

## 2023-03-03 ENCOUNTER — Telehealth: Payer: Self-pay | Admitting: Cardiovascular Disease

## 2023-03-03 MED ORDER — PROPRANOLOL HCL ER 80 MG PO CP24: 80.0000 mg | ORAL_CAPSULE | Freq: Two times a day (BID) | ORAL | 2 refills | Status: DC

## 2023-03-03 NOTE — Telephone Encounter (Signed)
Requested Prescriptions   Signed Prescriptions Disp Refills   propranolol ER (INDERAL LA) 80 MG 24 hr capsule 180 capsule 2    Sig: Take 1 capsule (80 mg total) by mouth 2 (two) times daily.    Authorizing Provider: Antonieta Iba    Ordering User: Thayer Headings, Mahamadou Weltz L

## 2023-03-03 NOTE — Telephone Encounter (Signed)
*  STAT* If patient is at the pharmacy, call can be transferred to refill team.   1. Which medications need to be refilled? (please list name of each medication and dose if known)   propranolol ER (INDERAL LA) 80 MG 24 hr capsule    2. Which pharmacy/location (including street and city if local pharmacy) is medication to be sent to? [facility] - [place], [place] - [address]   3. Do they need a 30 day or 90 day supply? 90 day

## 2023-03-04 ENCOUNTER — Ambulatory Visit: Admission: RE | Admit: 2023-03-04 | Payer: Medicare Other | Source: Ambulatory Visit

## 2023-03-05 ENCOUNTER — Ambulatory Visit (INDEPENDENT_AMBULATORY_CARE_PROVIDER_SITE_OTHER): Payer: Medicare Other | Admitting: Licensed Clinical Social Worker

## 2023-03-05 DIAGNOSIS — F4310 Post-traumatic stress disorder, unspecified: Secondary | ICD-10-CM

## 2023-03-05 DIAGNOSIS — F331 Major depressive disorder, recurrent, moderate: Secondary | ICD-10-CM

## 2023-03-05 DIAGNOSIS — F419 Anxiety disorder, unspecified: Secondary | ICD-10-CM

## 2023-03-07 ENCOUNTER — Telehealth: Payer: Self-pay | Admitting: Internal Medicine
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
## 2023-03-07 NOTE — Telephone Encounter (Signed)
Pt need a refill on [facility]

## 2023-03-09 ENCOUNTER — Encounter: Payer: Self-pay | Admitting: Internal Medicine

## 2023-03-10 ENCOUNTER — Encounter: Payer: Self-pay | Admitting: Internal Medicine

## 2023-03-10 ENCOUNTER — Other Ambulatory Visit: Payer: Self-pay

## 2023-03-10 ENCOUNTER — Other Ambulatory Visit: Payer: Self-pay | Admitting: Internal Medicine

## 2023-03-10 ENCOUNTER — Ambulatory Visit
Admission: RE | Admit: 2023-03-10 | Discharge: 2023-03-10 | Disposition: A | Discharge status: Home / Self Care | Payer: Medicare Other | Source: Ambulatory Visit | Attending: Acute Care | Admitting: Acute Care

## 2023-03-10 DIAGNOSIS — Z87891 Personal history of nicotine dependence: Secondary | ICD-10-CM

## 2023-03-10 DIAGNOSIS — Z122 Encounter for screening for malignant neoplasm of respiratory organs: Secondary | ICD-10-CM | POA: Diagnosis not present

## 2023-03-10 DIAGNOSIS — F17210 Nicotine dependence, cigarettes, uncomplicated: Secondary | ICD-10-CM | POA: Diagnosis not present

## 2023-03-10 DIAGNOSIS — J449 Chronic obstructive pulmonary disease, unspecified: Secondary | ICD-10-CM

## 2023-03-10 MED ORDER — AMLODIPINE BESYLATE 5 MG PO TABS: 5.0000 mg | ORAL_TABLET | Freq: Every day | ORAL | 0 refills | Status: DC

## 2023-03-10 NOTE — Telephone Encounter (Signed)
See mychart.  

## 2023-03-10 NOTE — Telephone Encounter (Signed)
Please call and notify her blood sugars overall look good.  Blood pressures varying, but relatively stable.  If having increased swelling, can schedule an appt for me to evaluate and discuss other treatment options.  Happy Birthday!

## 2023-03-11 ENCOUNTER — Telehealth: Payer: Self-pay

## 2023-03-11 ENCOUNTER — Telehealth: Payer: Self-pay | Admitting: Internal Medicine

## 2023-03-11 NOTE — Telephone Encounter (Signed)
We can work her in this PM or tomorrow. Let me know.

## 2023-03-11 NOTE — Telephone Encounter (Signed)
LMTCB Called patient to schedule her at 11:00 03/12/23 if patient able to come. If so, Ok to schedule.

## 2023-03-11 NOTE — Telephone Encounter (Signed)
Rx for inhaler sent in 4/16

## 2023-03-11 NOTE — Telephone Encounter (Signed)
Pt returned [place] LPN call. Note below was read to her. She's booked for tomorrow @ 11am.

## 2023-03-11 NOTE — Telephone Encounter (Signed)
Prescription Request  03/11/2023  LOV: Visit date not found  What is the name of the medication or equipment?  albuterol (VENTOLIN HFA) 108 (90 Base) MCG/ACT inhaler   Have you contacted your pharmacy to request a refill? Yes   Which pharmacy would you like this sent to?  [facility] - [place], [place] - [address] [place] [address] Phone: [phone] Fax: [phone]    Patient notified that their request is being sent to the clinical staff for review and that they should receive a response within 2 business days.   Please advise at Mobile [phone] (mobile)   Patient states she is out of this medication.  Patient states she would like for [place] to send just one inhaler to [facility] and then send an ongoing prescription to [facility].

## 2023-03-11 NOTE — Telephone Encounter (Signed)
If ok to wait, I will see her tomorrow at 11:00.  Thanks

## 2023-03-11 NOTE — Telephone Encounter (Signed)
Noted  

## 2023-03-11 NOTE — Telephone Encounter (Signed)
Pt is calling in to get scheduled for an appointment to be seen for swelling in her feet and leg oozing.  Pt stated that she has been talking with Dr. Lorin Picket and wanted to know what she should do. Provider does not have anything until 03/24/2023 please advise as to where to schedule the pt.

## 2023-03-12 ENCOUNTER — Other Ambulatory Visit: Payer: Self-pay

## 2023-03-12 ENCOUNTER — Encounter: Payer: Self-pay | Admitting: Internal Medicine

## 2023-03-12 ENCOUNTER — Ambulatory Visit (INDEPENDENT_AMBULATORY_CARE_PROVIDER_SITE_OTHER): Payer: Medicare Other | Admitting: Internal Medicine

## 2023-03-12 VITALS — BP 118/68 | HR 80 | Temp 98.2°F | Resp 16 | Ht 64.0 in | Wt 211.0 lb

## 2023-03-12 DIAGNOSIS — W5503XA Scratched by cat, initial encounter: Secondary | ICD-10-CM

## 2023-03-12 DIAGNOSIS — Z122 Encounter for screening for malignant neoplasm of respiratory organs: Secondary | ICD-10-CM

## 2023-03-12 DIAGNOSIS — J849 Interstitial pulmonary disease, unspecified: Secondary | ICD-10-CM

## 2023-03-12 DIAGNOSIS — G4733 Obstructive sleep apnea (adult) (pediatric): Secondary | ICD-10-CM | POA: Diagnosis not present

## 2023-03-12 DIAGNOSIS — M7989 Other specified soft tissue disorders: Secondary | ICD-10-CM

## 2023-03-12 DIAGNOSIS — I700 Atherosclerosis of aorta: Secondary | ICD-10-CM

## 2023-03-12 DIAGNOSIS — S80811A Abrasion, right lower leg, initial encounter: Secondary | ICD-10-CM

## 2023-03-12 DIAGNOSIS — I10 Essential (primary) hypertension: Secondary | ICD-10-CM

## 2023-03-12 DIAGNOSIS — E1159 Type 2 diabetes mellitus with other circulatory complications: Secondary | ICD-10-CM

## 2023-03-12 DIAGNOSIS — F331 Major depressive disorder, recurrent, moderate: Secondary | ICD-10-CM

## 2023-03-12 DIAGNOSIS — K219 Gastro-esophageal reflux disease without esophagitis: Secondary | ICD-10-CM

## 2023-03-12 DIAGNOSIS — J449 Chronic obstructive pulmonary disease, unspecified: Secondary | ICD-10-CM

## 2023-03-12 DIAGNOSIS — R21 Rash and other nonspecific skin eruption: Secondary | ICD-10-CM

## 2023-03-12 DIAGNOSIS — I480 Paroxysmal atrial fibrillation: Secondary | ICD-10-CM

## 2023-03-12 DIAGNOSIS — Z87891 Personal history of nicotine dependence: Secondary | ICD-10-CM

## 2023-03-12 DIAGNOSIS — E7800 Pure hypercholesterolemia, unspecified: Secondary | ICD-10-CM

## 2023-03-12 DIAGNOSIS — I779 Disorder of arteries and arterioles, unspecified: Secondary | ICD-10-CM
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
## 2023-03-13 ENCOUNTER — Other Ambulatory Visit: Payer: Self-pay | Admitting: *Deleted

## 2023-03-13 DIAGNOSIS — J449 Chronic obstructive pulmonary disease, unspecified: Secondary | ICD-10-CM

## 2023-03-13 MED ORDER — ALBUTEROL SULFATE HFA 108 (90 BASE) MCG/ACT IN AERS
INHALATION_SPRAY | RESPIRATORY_TRACT | 11 refills | Status: DC
Start: 2023-03-13 — End: 2023-10-10

## 2023-03-13 MED ORDER — TRIAMCINOLONE ACETONIDE 0.1 % EX CREA
1.0000 | TOPICAL_CREAM | Freq: Two times a day (BID) | CUTANEOUS | 0 refills | Status: DC
Start: 2023-03-13 — End: 2024-01-13

## 2023-03-13 MED ORDER — MUPIROCIN 2 % EX OINT: 1.0000 | TOPICAL_OINTMENT | Freq: Two times a day (BID) | CUTANEOUS | 0 refills | Status: AC

## 2023-03-13 NOTE — Telephone Encounter (Signed)
Pt aware that albuterol was resent to [facility]. It failed to submit on 03/11/23.  Pt also mentioned that Dr. Lorin Picket was supposed to send in a cream during her visit yesterday.  I could not find the name to send it in for her in the note.  Please send in and I will let pt know once it is sent in per her request.

## 2023-03-13 NOTE — Telephone Encounter (Signed)
F/u  The patient called voiced that she has not received her medication albuterol (VENTOLIN HFA) 108 (90 Base) MCG/ACT inhaler  & cream   [facility] [phone]

## 2023-03-13 NOTE — Telephone Encounter (Signed)
Called and spoke to Kathryn Friedman.  She denies allergy to methylprednisolone.  Had used HC cream.  Triamcinolone cream rx sent in to [facility]

## 2023-03-13 NOTE — Telephone Encounter (Signed)
Called patient back & discussed the Bactroban. She states that she has some at home & was told to use on the right leg with the cat scratch, but was also told not to use it on the left leg. Left leg has a red area on it that she was supposed to use a different ointment or cream on that area per yesterday's visit.
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
## 2023-03-16 ENCOUNTER — Encounter: Payer: Self-pay | Admitting: Internal Medicine

## 2023-03-16 DIAGNOSIS — M7989 Other specified soft tissue disorders: Secondary | ICD-10-CM | POA: Insufficient documentation

## 2023-03-16 NOTE — Progress Notes (Incomplete)
Subjective:    Patient ID: Kathryn Friedman, female    DOB: 1956/01/07, 67 y.o.   MRN: [id number]  Patient here for  Chief Complaint  Patient presents with  . Leg Swelling    HPI Work in appt - work in for leg swelling.  On amlodipine 5mg  q day.  Also on imdur, micardis and propranolol. Swelling in her feet - better today.  States "shoes pushed fluid up".  Discussed compression hose.  Has been wearing hose, but feel they are too tight.  Getting a new pair today.  No chest pain.  Breathing stable.  No cough or congestion.  No abdominal pain.  Cat scratch - right lower leg.  No increased erythema.  Rash - left lower leg.  Occasional itching.  Appears to be c/w stasis dermatitis.     Past Medical History:  Diagnosis Date  . Anemia   . Anginal pain   . Anxiety   . Arthritis    back and knees  . Asthma   . Bilateral carotid artery stenosis   . Blood in stool   . Chronic diarrhea   . COPD (chronic obstructive pulmonary disease)   . Coronary artery disease    a.) 75% pRCA; 3.5 x 28 mm Cypher DES placed on 07/24/2006  . Current use of long term anticoagulation    Clopidogrel  . Depression    secondary to the death of her husband (died 89)  . Diverticulitis   . Diverticulosis   . Dizzinesses   . Dysphagia   . Dyspnea   . Dysrhythmia   . Fatty infiltration of liver   . GERD (gastroesophageal reflux disease)   . Headache   . History of 2019 novel coronavirus disease (COVID-19) 12/09/2020  . History of 2019 novel coronavirus disease (COVID-19) 12/20/2020  . Hypertension   . Hypertriglyceridemia   . ILD (interstitial lung disease)   . Lump in the abdomen   . OSA on CPAP   . Overactive bladder   . PSVT (paroxysmal supraventricular tachycardia)   . Spastic colon   . T2DM (type 2 diabetes mellitus) 05/2008  . Tobacco abuse   . Venous insufficiency of both lower extremities    Past Surgical History:  Procedure Laterality Date  . ABDOMINAL HYSTERECTOMY  with left ovary in  place 1996  . APPENDECTOMY  1985   gallbladder and Appendix  . BREAST BIOPSY Left 10/14/2017   calcs bx, fibrosis giant cell reaction and chronic inflammation, negative for malignancy.   . CARPOMETACARPAL (CMC) FUSION OF THUMB Right 07/10/2022   Procedure: CARPOMETACARPAL (CMC) SUSPENSION OF RIGHT THUMB;  Surgeon: Christena Flake, MD;  Location: [facility] ORS;  Service: Orthopedics;  Laterality: Right;  . CATARACT EXTRACTION, BILATERAL    . CESAREAN SECTION  1984  . CHOLECYSTECTOMY  1985  . COLONOSCOPY WITH PROPOFOL N/A 09/13/2016   Procedure: COLONOSCOPY WITH PROPOFOL;  Surgeon: Scot Jun, MD;  Location: [facility] ENDOSCOPY;  Service: Endoscopy;  Laterality: N/A;  . COLONOSCOPY WITH PROPOFOL N/A 11/09/2018   Procedure: COLONOSCOPY WITH PROPOFOL;  Surgeon: Scot Jun, MD;  Location: [facility]COPY;  Service: Endoscopy;  Laterality: N/A;  . COLONOSCOPY WITH PROPOFOL N/A 03/29/2020   Procedure: COLONOSCOPY WITH PROPOFOL;  Surgeon: Earline Mayotte, MD;  Location: [facility] ENDOSCOPY;  Service: Endoscopy;  Laterality: N/A;  . CORONARY ANGIOPLASTY WITH STENT PLACEMENT N/A 07/24/2006   75% pRCA; 3.5 x 28 mm Cypher DES placed; Location: [facility]; Surgeons: Rudean Hitt, MD  . ESOPHAGOGASTRODUODENOSCOPY (EGD) WITH  PROPOFOL N/A 02/02/2018   Procedure: ESOPHAGOGASTRODUODENOSCOPY (EGD) WITH PROPOFOL;  Surgeon: Scot Jun, MD;  Location: [facility] ENDOSCOPY;  Service: Endoscopy;  Laterality: N/A;  . ESOPHAGOGASTRODUODENOSCOPY (EGD) WITH PROPOFOL N/A 03/29/2020   Procedure: ESOPHAGOGASTRODUODENOSCOPY (EGD) WITH PROPOFOL;  Surgeon: Earline Mayotte, MD;  Location: [facility] ENDOSCOPY;  Service: Endoscopy;  Laterality: N/A;  . EYE SURGERY    . JOINT REPLACEMENT     bilateral knee replacements  . KNEE ARTHROSCOPY  Arthroscopic left knee surgery   . KNEE SURGERY  status post knee surgey   . LEFT HEART CATH AND CORONARY ANGIOGRAPHY Left 05/14/2018   Procedure: LEFT HEART CATH AND CORONARY ANGIOGRAPHY;   Surgeon: Lamar Blinks, MD;  Location: [facility] INVASIVE CV LAB;  Service: Cardiovascular;  Laterality: Left;  . REPLACEMENT TOTAL KNEE  ([facility])  . SHOULDER SURGERY  shoulder operation secondary to a torn tendon  . TOTAL HIP ARTHROPLASTY Left 06/12/2021   Procedure: TOTAL HIP ARTHROPLASTY;  Surgeon: Christena Flake, MD;  Location: [facility] ORS;  Service: Orthopedics;  Laterality: Left;   Family History  Problem Relation Age of Onset  . Other Mother        Hit by a fire truck and has had multiple operations on her back , and has history of MVP   . Mitral valve prolapse Mother   . Lung cancer Mother   . Depression Mother   . Heart disease Father        myocardial infarction and is status post bypass surgery  . Mitral valve prolapse Sister   . Bipolar disorder Sister   . Hepatitis C Brother   . Cirrhosis Brother   . Colon cancer Paternal Aunt   . Breast cancer Neg Hx   . Prostate cancer Neg Hx   . Bladder Cancer Neg Hx   . Kidney cancer Neg Hx    Social History   Socioeconomic History  . Marital status: Widowed    Spouse name: Marijose Curington  . Number of children: 1  . Years of education: 44  . Highest education level: Associate degree: occupational, Scientist, product/process development, or vocational program  Occupational History    Employer: [employer]  Tobacco Use  . Smoking status: Every Day    Packs/day: 2.00    Years: 45.00    Additional pack years: 0.00    Total pack years: 90.00    Types: Cigarettes    Passive exposure: Current  . Smokeless tobacco: Never  . Tobacco comments:    Smokes 18 cigarettes every day 12/24/22  Vaping Use  . Vaping Use: Former  . Substances: Flavoring  Substance and Sexual Activity  . Alcohol use: No    Alcohol/week: 0.0 standard drinks of alcohol  . Drug use: No  . Sexual activity: Not Currently  Other Topics Concern  . Not on file  Social History Narrative  . Not on file   Social Determinants of Health   Financial Resource Strain: Medium Risk (03/11/2023)   Overall  Financial Resource Strain (CARDIA)   . Difficulty of Paying Living Expenses: Somewhat hard  Food Insecurity: Food Insecurity Present (03/11/2023)   Hunger Vital Sign   . Worried About Programme researcher, broadcasting/film/video in the Last Year: Never true   . Ran Out of Food in the Last Year: Sometimes true  Transportation Needs: No Transportation Needs (03/11/2023)   PRAPARE - Transportation   . Lack of Transportation (Medical): No   . Lack of Transportation (Non-Medical): No  Physical Activity: Unknown (03/11/2023)   Exercise  Vital Sign   . Days of Exercise per Week: 0 days   . Minutes of Exercise per Session: Not on file  Stress: Stress Concern Present (03/11/2023)   [employer] of Occupational Health - Occupational Stress Questionnaire   . Feeling of Stress : To some extent  Social Connections: Socially Isolated (03/11/2023)   Social Connection and Isolation Panel [NHANES]   . Frequency of Communication with Friends and Family: More than three times a week   . Frequency of Social Gatherings with Friends and Family: More than three times a week   . Attends Religious Services: Never   . Active Member of Clubs or Organizations: No   . Attends Banker Meetings: Never   . Marital Status: Widowed     Review of Systems  Constitutional:  Negative for appetite change and unexpected weight change.  HENT:  Negative for congestion and sinus pressure.   Respiratory:  Negative for cough, chest tightness and shortness of breath.   Cardiovascular:  Positive for leg swelling. Negative for chest pain and palpitations.  Gastrointestinal:  Negative for abdominal pain, diarrhea, nausea and vomiting.  Genitourinary:  Negative for difficulty urinating and dysuria.  Musculoskeletal:  Negative for joint swelling and myalgias.  Skin:  Positive for rash.  Neurological:  Negative for dizziness and headaches.  Psychiatric/Behavioral:  Negative for agitation and dysphoric mood.        Objective:     BP  118/68   Pulse 80   Temp 98.2 F (36.8 C)   Resp 16   Ht  (1.626 m)   Wt 211 lb (95.7 kg)   SpO2 96%   BMI 36.22 kg/m  Wt Readings from Last 3 Encounters:  03/12/23 211 lb (95.7 kg)  01/06/23 210 lb 3.2 oz (95.3 kg)  12/24/22 208 lb 8 oz (94.6 kg)    Physical Exam Vitals reviewed.  Constitutional:      General: She is not in acute distress.    Appearance: Normal appearance.  HENT:     Head: Normocephalic and atraumatic.     Right Ear: External ear normal.     Left Ear: External ear normal.  Eyes:     General: No scleral icterus.       Right eye: No discharge.        Left eye: No discharge.     Conjunctiva/sclera: Conjunctivae normal.  Neck:     Thyroid: No thyromegaly.  Cardiovascular:     Rate and Rhythm: Normal rate and regular rhythm.  Pulmonary:     Effort: No respiratory distress.     Breath sounds: Normal breath sounds. No wheezing.  Abdominal:     General: Bowel sounds are normal.     Palpations: Abdomen is soft.     Tenderness: There is no abdominal tenderness.  Musculoskeletal:        General: No tenderness.     Cervical back: Neck supple. No tenderness.     Comments: Some lower extremity swelling.    Lymphadenopathy:     Cervical: No cervical adenopathy.  Skin:    Findings: No erythema.     Comments: Rash - left lower leg - appears to be c/w stasis dermatitis.    Neurological:     Mental Status: She is alert.  Psychiatric:        Mood and Affect: Mood normal.        Behavior: Behavior normal.      Outpatient Encounter Medications as of 03/12/2023  Medication Sig  . albuterol (VENTOLIN HFA) 108 (90 Base) MCG/ACT inhaler INHALE TWO PUFFS FOUR TIMES DAILY  . amLODipine (NORVASC) 5 MG tablet Take 1 tablet (5 mg total) by mouth daily.  Marland Kitchen apixaban (ELIQUIS) 5 MG TABS tablet Take 1 tablet (5 mg total) by mouth 2 (two) times daily.  . Budeson-Glycopyrrol-Formoterol (BREZTRI AEROSPHERE) 160-9-4.8 MCG/ACT AERO Inhale 2 puffs into the lungs in the  morning and at bedtime.  Marland Kitchen CALCIUM PO Take 600 mg by mouth daily.   . cholecalciferol (VITAMIN D3) 25 MCG (1000 UNIT) tablet Take 1,000 Units by mouth daily.  . Continuous Blood Gluc Receiver DEVI Use as directed to check blood sugars daily  . Continuous Blood Gluc Sensor (DEXCOM G7 SENSOR) MISC Apply 1 sensor every 10 days.  . Cyanocobalamin (VITAMIN B-12 PO) Take 2,500 mcg by mouth daily.  . DULoxetine (CYMBALTA) 60 MG capsule TAKE 1 CAPSULE EVERY DAY  . famotidine (PEPCID) 40 MG tablet Take 1 tablet (40 mg total) by mouth daily.  Marland Kitchen HM MELATONIN PO 5 mg by Other route at bedtime as needed. patch  . isosorbide mononitrate (IMDUR) 30 MG 24 hr tablet Take 1 tablet (30 mg total) by mouth 2 (two) times daily.  . Multiple Vitamin (MULTIVITAMIN) tablet Take 1 tablet by mouth daily.  . mupirocin ointment (BACTROBAN) 2 % Apply 1 application. topically 2 (two) times daily.  . nitroGLYCERIN (NITROSTAT) 0.4 MG SL tablet Place 1 tablet (0.4 mg total) under the tongue every 5 (five) minutes as needed for chest pain.  Marland Kitchen nystatin (MYCOSTATIN/NYSTOP) powder APPLY TO THE AFFECTED AREA(S) TWICE DAILY  . pantoprazole (PROTONIX) 40 MG tablet TAKE 1 TABLET TWICE DAILY BEFORE MEALS  . propranolol ER (INDERAL LA) 80 MG 24 hr capsule Take 1 capsule (80 mg total) by mouth 2 (two) times daily.  Marland Kitchen rOPINIRole (REQUIP) 0.5 MG tablet Take 1 tablet (0.5 mg total) by mouth daily after lunch. Take along with 3 mg , total of 3.5 mg daily  . rOPINIRole (REQUIP) 3 MG tablet Take 1 tablet (3 mg total) by mouth at bedtime. Take along with 0.5 mg daily, total of 3.5 mg daily.  . rosuvastatin (CRESTOR) 20 MG tablet Take 1 tablet (20 mg total) by mouth daily.  . Semaglutide,0.25 or 0.5MG /DOS, (OZEMPIC, 0.25 OR 0.5 MG/DOSE,) 2 MG/1.5ML SOPN Inject 0.25 mg weekly for 4 weeks then increase to 0.5 mg weekly (Patient taking differently: Inject 0.5 mg into the skin once a week. Inject 0.25 mg weekly for 4 weeks then increase to 0.5 mg weekly)   . telmisartan (MICARDIS) 80 MG tablet TAKE 1 TABLET EVERY DAY  . traZODone (DESYREL) 100 MG tablet Take 1.5-2 tablets (150-200 mg total) by mouth at bedtime.  Marland Kitchen trimethoprim (TRIMPEX) 100 MG tablet Take 1 tablet (100 mg total) by mouth daily.  . Vibegron (GEMTESA) 75 MG TABS Take 1 tablet (75 mg total) by mouth daily.  . [DISCONTINUED] Accu-Chek Softclix Lancets lancets Use as instructed  . [DISCONTINUED] Blood Glucose Monitoring Suppl (TRUE METRIX METER) w/Device KIT Use as directed twice a day to check sugars  . [DISCONTINUED] TRUE METRIX BLOOD GLUCOSE TEST test strip USE AS INSTRUCTED TO CHECK BLOOD SUGARS TWICE DAILY.   No facility-administered encounter medications on file as of 03/12/2023.     Lab Results  Component Value Date   WBC 7.1 12/20/2022   HGB 14.6 12/20/2022   HCT 42.7 12/20/2022   PLT 144.0 (L) 12/20/2022   GLUCOSE 133 (H) 12/20/2022   CHOL 119  12/20/2022   TRIG 106.0 12/20/2022   HDL 44.70 12/20/2022   LDLDIRECT 103.0 03/30/2021   LDLCALC 53 12/20/2022   ALT 28 12/20/2022   AST 21 12/20/2022   NA 137 12/20/2022   K 4.1 12/20/2022   CL 102 12/20/2022   CREATININE 0.63 12/20/2022   BUN 12 12/20/2022   CO2 27 12/20/2022   TSH 1.23 06/17/2022   HGBA1C 6.2 12/20/2022   MICROALBUR <0.7 12/20/2022    No results found.     Assessment & Plan:  Swelling of lower extremity     Dale Belvidere, MD

## 2023-03-16 NOTE — Assessment & Plan Note (Signed)
On crestor.  Low cholesterol diet and exercise.  Follow lipid panel and liver function tests.   

## 2023-03-16 NOTE — Assessment & Plan Note (Signed)
No upper symptoms reported.  On protonix.   

## 2023-03-16 NOTE — Assessment & Plan Note (Signed)
On eliquis.  Stable.  Continue f/u with cardiology.  

## 2023-03-16 NOTE — Assessment & Plan Note (Signed)
Being followed by psychiatry.  On cymbalta.  Overall appears to be doing better.  Has a new dog now (Piper).  Follow.  

## 2023-03-16 NOTE — Assessment & Plan Note (Signed)
Kathryn Friedman - 09/2022 - new cpap 

## 2023-03-16 NOTE — Assessment & Plan Note (Signed)
Breztri.  Breathing stable.  

## 2023-03-16 NOTE — Assessment & Plan Note (Signed)
Bilateral swelling.  Has been wearing compression hose.  Feel too tight.  Getting a new pair today.  Will continue on amlodipine  q day.  Hold on diuretic.  Lungs clear.  Avoid increased sodium intake. Compression hose.  Leg elevation.  Follow.  Discussed vascular surgery referral given persistent swelling.

## 2023-03-17 ENCOUNTER — Ambulatory Visit (INDEPENDENT_AMBULATORY_CARE_PROVIDER_SITE_OTHER): Payer: Medicare Other

## 2023-03-17 VITALS — BP 119/66 | HR 85 | Ht 64.0 in | Wt 211.0 lb

## 2023-03-17 DIAGNOSIS — R21 Rash and other nonspecific skin eruption: Secondary | ICD-10-CM | POA: Insufficient documentation

## 2023-03-17 DIAGNOSIS — Z0000 Encounter for general adult medical examination without abnormal findings: Secondary | ICD-10-CM | POA: Diagnosis not present

## 2023-03-17 DIAGNOSIS — W5503XA Scratched by cat, initial encounter: Secondary | ICD-10-CM | POA: Insufficient documentation

## 2023-03-17 NOTE — Assessment & Plan Note (Signed)
Sees Dr Fleming.  Using breztri.  Breathing stable.  No increased sob, cough or congestion. Dr Fleming recommended f/u chest CT in 02/2023.  

## 2023-03-17 NOTE — Assessment & Plan Note (Signed)
Low carb diet and exercise.  Follow met b and a1c. Continue GLP - 1 agonist.
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
## 2023-03-17 NOTE — Assessment & Plan Note (Signed)
Continue lipitor.  Continue eliquis.   

## 2023-03-17 NOTE — Assessment & Plan Note (Signed)
Left lower leg - rash.  Appears to be c/w stasis dermatitis.  Triamcinolone cream as directed.  Follow.

## 2023-03-17 NOTE — Assessment & Plan Note (Signed)
Continue lipitor  ?

## 2023-03-17 NOTE — Assessment & Plan Note (Signed)
Apply bactroban as directed.  Follow

## 2023-03-17 NOTE — Assessment & Plan Note (Signed)
Continue micardis  q day and amlodipine  q day.  Dr Mariah Milling increased propranolol. Blood pressures have been under reasonable control. Follow pressures.  Follow metabolic panel. Hold on changing medication.  Follow.

## 2023-03-17 NOTE — Patient Instructions (Addendum)
Kathryn Friedman , Thank you for taking time to come for your Medicare Wellness Visit. I appreciate your ongoing commitment to your health goals. Please review the following plan we discussed and let me know if I can assist you in the future.   These are the goals we discussed:  Goals      Follow up with Primary Care Provider     As needed        This is a list of the screening recommended for you and due dates:  Health Maintenance  Topic Date Due   Complete foot exam   03/10/2023   COVID-19 Vaccine (4 - 2023-24 season) 03/28/2023*   Zoster (Shingles) Vaccine (2 of 2) 06/11/2023*   Pneumonia Vaccine (2 of 2 - PCV) 10/12/2023*   Eye exam for diabetics  04/09/2023   Mammogram  05/09/2023   Hemoglobin A1C  06/20/2023   Flu Shot  06/26/2023   Yearly kidney function blood test for diabetes  12/21/2023   Yearly kidney health urinalysis for diabetes  12/21/2023   Screening for Lung Cancer  03/09/2024   Medicare Annual Wellness Visit  03/16/2024   DTaP/Tdap/Td vaccine (2 - Tdap) 04/21/2028   Colon Cancer Screening  03/29/2030   DEXA scan (bone density measurement)  Completed   Hepatitis C Screening: USPSTF Recommendation to screen - Ages 71-79 yo.  Completed   HPV Vaccine  Aged Out  *Topic was postponed. The date shown is not the original due date.   Conditions/risks identified: none new  Next appointment: Follow up in one year for your annual wellness visit    Preventive Care 67 Years and Older, Female Preventive care refers to lifestyle choices and visits with your health care provider that can promote health and wellness. What does preventive care include? A yearly physical exam. This is also called an annual well check. Dental exams once or twice a year. Routine eye exams. Ask your health care provider how often you should have your eyes checked. Personal lifestyle choices, including: Daily care of your teeth and gums. Regular physical activity. Eating a healthy diet. Avoiding  tobacco and drug use. Limiting alcohol use. Practicing safe sex. Taking low-dose aspirin every day. Taking vitamin and mineral supplements as recommended by your health care provider. What happens during an annual well check? The services and screenings done by your health care provider during your annual well check will depend on your age, overall health, lifestyle risk factors, and family history of disease. Counseling  Your health care provider may ask you questions about your: Alcohol use. Tobacco use. Drug use. Emotional well-being. Home and relationship well-being. Sexual activity. Eating habits. History of falls. Memory and ability to understand (cognition). Work and work Astronomer. Reproductive health. Screening  You may have the following tests or measurements: Height, weight, and BMI. Blood pressure. Lipid and cholesterol levels. These may be checked every 5 years, or more frequently if you are over 50 years old. Skin check. Lung cancer screening. You may have this screening every year starting at age 69 if you have a 30-pack-year history of smoking and currently smoke or have quit within the past 15 years. Fecal occult blood test (FOBT) of the stool. You may have this test every year starting at age 67. Flexible sigmoidoscopy or colonoscopy. You may have a sigmoidoscopy every 5 years or a colonoscopy every 10 years starting at age 68. Hepatitis C blood test. Hepatitis B blood test. Sexually transmitted disease (STD) testing. Diabetes screening. This is done  by checking your blood sugar (glucose) after you have not eaten for a while (fasting). You may have this done every 1-3 years. Bone density scan. This is done to screen for osteoporosis. You may have this done starting at age 76. Mammogram. This may be done every 1-2 years. Talk to your health care provider about how often you should have regular mammograms. Talk with your health care provider about your test  results, treatment options, and if necessary, the need for more tests. Vaccines  Your health care provider may recommend certain vaccines, such as: Influenza vaccine. This is recommended every year. Tetanus, diphtheria, and acellular pertussis (Tdap, Td) vaccine. You may need a Td booster every 10 years. Zoster vaccine. You may need this after age 31. Pneumococcal 13-valent conjugate (PCV13) vaccine. One dose is recommended after age 34. Pneumococcal polysaccharide (PPSV23) vaccine. One dose is recommended after age 6. Talk to your health care provider about which screenings and vaccines you need and how often you need them. This information is not intended to replace advice given to you by your health care provider. Make sure you discuss any questions you have with your health care provider. Document Released: 12/08/2015 Document Revised: 07/31/2016 Document Reviewed: 09/12/2015 Elsevier Interactive Patient Education  2017 [place] Prevention in the Home Falls can cause injuries. They can happen to people of all ages. There are many things you can do to make your home safe and to help prevent falls. What can I do on the outside of my home? Regularly fix the edges of walkways and driveways and fix any cracks. Remove anything that might make you trip as you walk through a door, such as a raised step or threshold. Trim any bushes or trees on the path to your home. Use bright outdoor lighting. Clear any walking paths of anything that might make someone trip, such as rocks or tools. Regularly check to see if handrails are loose or broken. Make sure that both sides of any steps have handrails. Any raised decks and porches should have guardrails on the edges. Have any leaves, snow, or ice cleared regularly. Use sand or salt on walking paths during winter. Clean up any spills in your garage right away. This includes oil or grease spills. What can I do in the bathroom? Use night  lights. Install grab bars by the toilet and in the tub and shower. Do not use towel bars as grab bars. Use non-skid mats or decals in the tub or shower. If you need to sit down in the shower, use a plastic, non-slip stool. Keep the floor dry. Clean up any water that spills on the floor as soon as it happens. Remove soap buildup in the tub or shower regularly. Attach bath mats securely with double-sided non-slip rug tape. Do not have throw rugs and other things on the floor that can make you trip. What can I do in the bedroom? Use night lights. Make sure that you have a light by your bed that is easy to reach. Do not use any sheets or blankets that are too big for your bed. They should not hang down onto the floor. Have a firm chair that has side arms. You can use this for support while you get dressed. Do not have throw rugs and other things on the floor that can make you trip. What can I do in the kitchen? Clean up any spills right away. Avoid walking on wet floors. Keep items that you use  a lot in easy-to-reach places. If you need to reach something above you, use a strong step stool that has a grab bar. Keep electrical cords out of the way. Do not use floor polish or wax that makes floors slippery. If you must use wax, use non-skid floor wax. Do not have throw rugs and other things on the floor that can make you trip. What can I do with my stairs? Do not leave any items on the stairs. Make sure that there are handrails on both sides of the stairs and use them. Fix handrails that are broken or loose. Make sure that handrails are as long as the stairways. Check any carpeting to make sure that it is firmly attached to the stairs. Fix any carpet that is loose or worn. Avoid having throw rugs at the top or bottom of the stairs. If you do have throw rugs, attach them to the floor with carpet tape. Make sure that you have a light switch at the top of the stairs and the bottom of the stairs. If  you do not have them, ask someone to add them for you. What else can I do to help prevent falls? Wear shoes that: Do not have high heels. Have rubber bottoms. Are comfortable and fit you well. Are closed at the toe. Do not wear sandals. If you use a stepladder: Make sure that it is fully opened. Do not climb a closed stepladder. Make sure that both sides of the stepladder are locked into place. Ask someone to hold it for you, if possible. Clearly mark and make sure that you can see: Any grab bars or handrails. First and last steps. Where the edge of each step is. Use tools that help you move around (mobility aids) if they are needed. These include: Canes. Walkers. Scooters. Crutches. Turn on the lights when you go into a dark area. Replace any light bulbs as soon as they burn out. Set up your furniture so you have a clear path. Avoid moving your furniture around. If any of your floors are uneven, fix them. If there are any pets around you, be aware of where they are. Review your medicines with your doctor. Some medicines can make you feel dizzy. This can increase your chance of falling. Ask your doctor what other things that you can do to help prevent falls. This information is not intended to replace advice given to you by your health care provider. Make sure you discuss any questions you have with your health care provider. Document Released: 09/07/2009 Document Revised: 04/18/2016 Document Reviewed: 12/16/2014 Elsevier Interactive Patient Education  2017 [employer].  Mammogram A mammogram is an X-ray of the breasts. This procedure can screen for and detect any changes that may indicate breast cancer. Mammograms are regularly done beginning at age 28 for women with average risk. A man may have a mammogram if he has a lump or swelling in his breast tissue. A mammogram can also identify other changes and variations in the breast, such as: Inflammation of the breast tissue  (mastitis). An infected area that contains a collection of pus (abscess). A fluid-filled sac (cyst). Tumors that are not cancerous (benign). Fibrocystic changes. This is when breast tissue becomes denser and can make the tissue feel rope-like or uneven under the skin. Women at higher risk for breast cancer need earlier and more comprehensive screening for abnormal changes. Breast tomosynthesis, or three-dimensional (3D) mammography, and digital breast tomosynthesis are advanced forms of imaging that create  3D pictures of the breasts. Tell a health care provider: About any allergies you have. If you have breast implants. If you have had previous breast disease, biopsy, or surgery. If you have a family history of breast cancer. If you are breastfeeding. Whether you are pregnant or may be pregnant. What are the risks? Generally, this is a safe procedure. However, problems may occur, including: Exposure to radiation. Radiation levels are very low with this test. The need for more tests. The mammogram fails to detect certain cancers or the results are misinterpreted. Difficulty with detecting breast cancer in women with dense breasts. What happens before the procedure? Schedule your test about 1-2 weeks after your menstrual period if you are menstruating. This is usually when your breasts are the least tender. If you have had a mammogram done at a different facility in the past, get the mammogram X-rays or have them sent to your current exam facility. The new and old images will be compared. Wash your breasts and underarms on the day of the test. Do not wear deodorants, perfumes, lotions, or powders anywhere on your body on the day of the test. Remove any jewelry from your neck. Wear clothes that you can change into and out of easily. What happens during the procedure?  You will undress from the waist up and put on a gown that opens in the front. You will stand in front of the X-ray  machine. Each breast will be placed between two plastic or glass plates. The plates will compress your breast for a few seconds. Try to stay as relaxed as possible. This procedure does not cause any harm to your breasts. Any discomfort you feel will be very brief. X-rays will be taken from different angles of each breast. The procedure may vary among health care providers and hospitals. What can I expect after the procedure? The mammogram will be examined by a specialist (radiologist). You may need to repeat certain parts of the test, depending on the quality of the images. This is done if the radiologist needs a better view of the breast tissue. You may resume your normal activities. It is up to you to get the results of your procedure. Ask your health care provider, or the department that is doing the procedure, when your results will be ready. Summary A mammogram is an X-ray of the breasts. This procedure can screen for and detect any changes that may indicate breast cancer. A man may have a mammogram if he has a lump or swelling in his breast tissue. If you have had a mammogram done at a different facility in the past, get the mammogram X-rays or have them sent to your current exam facility in order to compare them. Schedule your test about 1-2 weeks after your menstrual period if you are menstruating. Ask when your test results will be ready. Make sure you get your test results. This information is not intended to replace advice given to you by your health care provider. Make sure you discuss any questions you have with your health care provider. Document Revised: 07/25/2021 Document Reviewed: 09/11/2020 Elsevier Patient Education  2023 [employer].

## 2023-03-19 ENCOUNTER — Encounter: Payer: Self-pay | Admitting: Internal Medicine

## 2023-03-20 ENCOUNTER — Telehealth (INDEPENDENT_AMBULATORY_CARE_PROVIDER_SITE_OTHER): Payer: Medicare Other | Admitting: Licensed Clinical Social Worker

## 2023-03-20 DIAGNOSIS — Z634 Disappearance and death of family member: Secondary | ICD-10-CM

## 2023-03-20 DIAGNOSIS — F331 Major depressive disorder, recurrent, moderate: Secondary | ICD-10-CM | POA: Diagnosis not present

## 2023-03-20 DIAGNOSIS — F419 Anxiety disorder, unspecified: Secondary | ICD-10-CM

## 2023-03-20 DIAGNOSIS — F4310 Post-traumatic stress disorder, unspecified: Secondary | ICD-10-CM
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
## 2023-03-23 ENCOUNTER — Encounter: Payer: Self-pay | Admitting: Internal Medicine

## 2023-03-24 NOTE — Telephone Encounter (Signed)
Reviewed message.  Will f/u echo results.  Also, if place on back, I can work in for earlier appt.

## 2023-03-25 ENCOUNTER — Other Ambulatory Visit: Payer: Self-pay | Admitting: Internal Medicine

## 2023-03-25 ENCOUNTER — Ambulatory Visit: Payer: Medicare Other | Attending: Cardiovascular Disease

## 2023-03-25 DIAGNOSIS — R42 Dizziness and giddiness: Secondary | ICD-10-CM | POA: Diagnosis not present

## 2023-03-25 DIAGNOSIS — I480 Paroxysmal atrial fibrillation: Secondary | ICD-10-CM | POA: Diagnosis not present

## 2023-03-25 DIAGNOSIS — Z1231 Encounter for screening mammogram for malignant neoplasm of breast: Secondary | ICD-10-CM

## 2023-03-25 LAB — ECHOCARDIOGRAM COMPLETE
AR max vel: 1.74 cm2
AV Area VTI: 1.89 cm2
AV Area mean vel: 1.78 cm2
AV Mean grad: 6 mmHg
AV Peak grad: 10.6 mmHg
Ao pk vel: 1.63 m/s
Area-P 1/2: 3.77 cm2
S' Lateral: 2.7 cm

## 2023-03-25 NOTE — Telephone Encounter (Signed)
Noted  

## 2023-03-25 NOTE — Telephone Encounter (Signed)
Patient returned call from Rita Ohara, LPN.  I spoke with Bethann Berkshire and scheduled patient to see Dr. Dale Jeff on 03/28/2023 for the place on her back.

## 2023-03-25 NOTE — Telephone Encounter (Signed)
LMTCB

## 2023-03-28 ENCOUNTER — Ambulatory Visit (INDEPENDENT_AMBULATORY_CARE_PROVIDER_SITE_OTHER): Payer: Medicare Other | Admitting: Internal Medicine

## 2023-03-28 VITALS — BP 128/70 | HR 79 | Temp 97.9°F | Resp 16 | Ht 64.0 in | Wt 209.0 lb

## 2023-03-28 DIAGNOSIS — I700 Atherosclerosis of aorta: Secondary | ICD-10-CM

## 2023-03-28 DIAGNOSIS — I10 Essential (primary) hypertension: Secondary | ICD-10-CM

## 2023-03-28 DIAGNOSIS — I779 Disorder of arteries and arterioles, unspecified: Secondary | ICD-10-CM | POA: Diagnosis not present

## 2023-03-28 DIAGNOSIS — J449 Chronic obstructive pulmonary disease, unspecified: Secondary | ICD-10-CM | POA: Diagnosis not present

## 2023-03-28 DIAGNOSIS — Z79899 Other long term (current) drug therapy: Secondary | ICD-10-CM | POA: Diagnosis not present

## 2023-03-28 DIAGNOSIS — E1159 Type 2 diabetes mellitus with other circulatory complications: Secondary | ICD-10-CM

## 2023-03-28 DIAGNOSIS — L989 Disorder of the skin and subcutaneous tissue, unspecified: Secondary | ICD-10-CM

## 2023-03-28 DIAGNOSIS — G4733 Obstructive sleep apnea (adult) (pediatric): Secondary | ICD-10-CM

## 2023-03-28 DIAGNOSIS — I2510 Atherosclerotic heart disease of native coronary artery without angina pectoris: Secondary | ICD-10-CM | POA: Diagnosis not present

## 2023-03-28 DIAGNOSIS — F17200 Nicotine dependence, unspecified, uncomplicated: Secondary | ICD-10-CM
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
## 2023-03-30 ENCOUNTER — Encounter: Payer: Self-pay | Admitting: Internal Medicine

## 2023-03-30 NOTE — Assessment & Plan Note (Signed)
Persistent.  Darker center.  Refer to dermatology for further evaluation.

## 2023-03-30 NOTE — Assessment & Plan Note (Signed)
Has been trying to quit.  Discussed.  Continue to try to decrease.  Follow.

## 2023-03-30 NOTE — Assessment & Plan Note (Signed)
Low carb diet and exercise.  Follow met b and a1c. Continue GLP - 1 agonist. Have reviewed outside sugar readings.  No changes.

## 2023-03-30 NOTE — Assessment & Plan Note (Signed)
S/p stent placement.  Continue lipitor. Followed by cardiology.  On eliquis.    

## 2023-03-30 NOTE — Assessment & Plan Note (Signed)
Sees Dr Meredeth Ide.  Using breztri.  Breathing stable.  No increased sob, cough or congestion. Dr Meredeth Ide recommended f/u chest CT in 02/2023 -. Lung-RADS 2S, benign appearance or behavior. Continue annual screening with low-dose chest CT without contrast in 12 months.  Mild diffuse bronchial wall thickening with mild centrilobular and paraseptal emphysema; imaging findings suggestive of underlying COPD.

## 2023-03-30 NOTE — Assessment & Plan Note (Signed)
Continue lipitor.  Continue eliquis.   

## 2023-03-30 NOTE — Assessment & Plan Note (Signed)
Continue lipitor  ?

## 2023-03-30 NOTE — Assessment & Plan Note (Signed)
Continue micardis 80mg q day and amlodipine 5mg q day.  Dr Gollan increased propranolol. Blood pressures have been under reasonable control. Follow pressures.  Follow metabolic panel. Hold on changing medication.  Follow.   

## 2023-03-31 ENCOUNTER — Encounter: Payer: Self-pay | Admitting: Internal Medicine

## 2023-03-31 ENCOUNTER — Other Ambulatory Visit: Payer: Self-pay

## 2023-03-31 DIAGNOSIS — N3281 Overactive bladder: Secondary | ICD-10-CM

## 2023-03-31 NOTE — Telephone Encounter (Signed)
Please see if we can get her an earlier dermatology appt.  Thanks.

## 2023-04-01 ENCOUNTER — Other Ambulatory Visit: Payer: Medicare Other

## 2023-04-01 NOTE — Telephone Encounter (Signed)
See other note

## 2023-04-02 ENCOUNTER — Encounter: Payer: Self-pay | Admitting: Internal Medicine

## 2023-04-02 ENCOUNTER — Other Ambulatory Visit: Payer: Medicare Other

## 2023-04-02 DIAGNOSIS — N3281 Overactive bladder: Secondary | ICD-10-CM

## 2023-04-02 LAB — URINALYSIS, COMPLETE
Bilirubin, UA: NEGATIVE
Glucose, UA: NEGATIVE
Ketones, UA: NEGATIVE
Leukocytes,UA: NEGATIVE
Nitrite, UA: NEGATIVE
Protein,UA: NEGATIVE
RBC, UA: NEGATIVE
Specific Gravity, UA: 1.01 (ref 1.005–1.030)
Urobilinogen, Ur: 0.2 mg/dL (ref 0.2–1.0)
pH, UA: 5.5 (ref 5.0–7.5)

## 2023-04-02 LAB — MICROSCOPIC EXAMINATION

## 2023-04-03 ENCOUNTER — Telehealth: Payer: Self-pay | Admitting: Cardiovascular Disease

## 2023-04-03 ENCOUNTER — Telehealth: Payer: Medicare Other | Admitting: Licensed Clinical Social Worker

## 2023-04-03 DIAGNOSIS — F4310 Post-traumatic stress disorder, unspecified: Secondary | ICD-10-CM

## 2023-04-03 DIAGNOSIS — F331 Major depressive disorder, recurrent, moderate: Secondary | ICD-10-CM

## 2023-04-03 NOTE — Telephone Encounter (Signed)
Called and spoke with patient on the phone. Explained to the patient that we have the preliminary report but it has not been reviewed by the MD for the final report. Explained to the patient that we would call with the results once it has been reviewed by the MD. Patient verbalized understanding.

## 2023-04-03 NOTE — Telephone Encounter (Signed)
Order for CPAP supplies placed in folder for signature

## 2023-04-03 NOTE — Telephone Encounter (Signed)
Patient called and she says she needs everything renewed. Patient needs a prescription, the other one has expired.

## 2023-04-03 NOTE — Telephone Encounter (Signed)
Patient is calling requesting her echo results. Please advise.  

## 2023-04-04 NOTE — Telephone Encounter (Signed)
I found the CPAP orders and faxed them to [facility] care and received a ok confirmation

## 2023-04-04 NOTE — Telephone Encounter (Signed)
Patient states she is following-up on her order for her CPAP supplies.  After reading the messages in patient's chart regarding her CPAP supply request, I spoke with Prince Solian, CMA, who is assisting Dr. Dale Oxford today.  Lanora Manis found the paperwork and states that she will fax the request for patient.  I relayed message to patient.  Patient was irritated that this had not been completed yesterday, but was thankful for our help today.

## 2023-04-04 NOTE — Telephone Encounter (Signed)
Order signed and placed in box at [name]'s desk.  

## 2023-04-05 ENCOUNTER — Encounter: Payer: Self-pay | Admitting: Internal Medicine

## 2023-04-06 ENCOUNTER — Encounter: Payer: Self-pay | Admitting: Internal Medicine

## 2023-04-06 ENCOUNTER — Encounter: Payer: Self-pay | Admitting: Cardiovascular Disease

## 2023-04-07 ENCOUNTER — Other Ambulatory Visit: Payer: Self-pay | Admitting: *Deleted

## 2023-04-07 ENCOUNTER — Encounter (INDEPENDENT_AMBULATORY_CARE_PROVIDER_SITE_OTHER): Payer: Self-pay | Admitting: Vascular Surgery

## 2023-04-07 ENCOUNTER — Ambulatory Visit (INDEPENDENT_AMBULATORY_CARE_PROVIDER_SITE_OTHER): Payer: Medicare Other | Admitting: Vascular Surgery

## 2023-04-07 VITALS — BP 130/71 | HR 73 | Resp 16 | Ht 64.0 in | Wt 213.0 lb

## 2023-04-07 DIAGNOSIS — I480 Paroxysmal atrial fibrillation: Secondary | ICD-10-CM

## 2023-04-07 DIAGNOSIS — I10 Essential (primary) hypertension: Secondary | ICD-10-CM | POA: Diagnosis not present

## 2023-04-07 DIAGNOSIS — I872 Venous insufficiency (chronic) (peripheral): Secondary | ICD-10-CM

## 2023-04-07 DIAGNOSIS — I890 Lymphedema, not elsewhere classified: Secondary | ICD-10-CM | POA: Diagnosis not present

## 2023-04-07 DIAGNOSIS — I2510 Atherosclerotic heart disease of native coronary artery without angina pectoris: Secondary | ICD-10-CM

## 2023-04-07 LAB — CULTURE, URINE COMPREHENSIVE

## 2023-04-07 MED ORDER — CIPROFLOXACIN HCL 250 MG PO TABS: ORAL_TABLET | ORAL | 0 refills | Status: DC
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
## 2023-04-07 NOTE — Telephone Encounter (Signed)
LMTCB. After reviewing chart, Looks like saw [facility] Dura 09/2022 regarding new CPAP machine. May need to call [facility] and see if they can reach out to her supply company aboout fixing her machine/getting a new one.

## 2023-04-07 NOTE — Telephone Encounter (Signed)
Noted  

## 2023-04-07 NOTE — Telephone Encounter (Signed)
Pt called back and I read the message to her and she will reach out to them

## 2023-04-07 NOTE — Telephone Encounter (Signed)
LM for [facility] to return my call

## 2023-04-08 ENCOUNTER — Telehealth: Payer: Self-pay | Admitting: Primary Care

## 2023-04-08 NOTE — Telephone Encounter (Signed)
Called and spoke w/ pt she verbalized understanding, I have sent this message to BW for coverage when she returns to office.

## 2023-04-08 NOTE — Telephone Encounter (Signed)
Patient states needs order for new CPAP machine. Patient uses [facility]. Patient phone number is [phone].

## 2023-04-09 DIAGNOSIS — G4733 Obstructive sleep apnea (adult) (pediatric): Secondary | ICD-10-CM | POA: Diagnosis not present

## 2023-04-10 ENCOUNTER — Telehealth: Payer: Self-pay | Admitting: Primary Care

## 2023-04-10 NOTE — Telephone Encounter (Signed)
Disregard this message- did not mean to route to you. Nothing needed at this time.

## 2023-04-10 NOTE — Telephone Encounter (Signed)
Spoke with patient and [facility]. Machine did not come from [facility]. Requested new machine through [facility] to replace the broken one. CPAP technician is supposed to reach out to patient regarding her options. Patient is aware of this.

## 2023-04-10 NOTE — Telephone Encounter (Signed)
LM for [facility] to return my call  

## 2023-04-10 NOTE — Telephone Encounter (Signed)
Spoke to patient and relayed below message. She stated I will not, bye bye and ended the call. Will close encounter.  Nothing further needed.

## 2023-04-10 NOTE — Telephone Encounter (Signed)
I spoke with the patient. I asked if she had contacted [facility] about her machine. She said "[facility] is not going to do anything." I told her that her insurance will not pay for a new machine until she has had it 5 years and if her machine is broke [facility] will have to give her another one. She again said [facility] will not do anything. She said she will reach out to her insurance company and hung the phone up.  Nothing further needed.

## 2023-04-10 NOTE — Telephone Encounter (Signed)
Called and spoke to patient. She is requesting a new cpap machine. I explained to patient that according to our records, she received a new machine in 2020, therefore insurance will not cover a new machine. I recommended that she contact [facility] to have machine serviced. She stated that her insurance contacted [facility] and was advised that they do not service cpap machine. I attempted to explain to patient that [facility] does in fact service and trouble shoot cpap machines. She became upset and stated "I tell you what, ya'll just take the machine and stick it up your [phone]@@" and ended the call prematurely.  Routing to [employer] as an Financial planner.

## 2023-04-10 NOTE — Telephone Encounter (Signed)
Pt called stating she was told by the provider to contact her [facility] to get a new pap machine and they told her to contact [facility] for it to be repaired and [facility] told her to call her insurance company. Her insurance company has looked everywhere and they can not find anyone to repair. Pt need a cpap machine sent to [facility] because hers is broke

## 2023-04-10 NOTE — Telephone Encounter (Signed)
If she would like to purchase we can give her a script, she needs to address that with her insurance. Thanks

## 2023-04-10 NOTE — Telephone Encounter (Signed)
Please see last signed encounter. PT called her ins company who called [facility] on her behalf and they told the Ins Rep that [facility] does not repair CPAP machines and there is no place in [place] that does.  Please call PT to advise next steps. Sending back as High Priority to the the PT elevated emotional state. Maybe Verlon Au can assist at this point.   [phone]

## 2023-04-10 NOTE — Telephone Encounter (Signed)
I read Ms. Cobb's message to PT and PT is upset saying her machine is broke and she has to have a new machine.   Started spitting out smoke and quit on Saturday night.   Please call to advise @ [phone]

## 2023-04-11 NOTE — Telephone Encounter (Signed)
See other message

## 2023-04-11 NOTE — Telephone Encounter (Signed)
Pt called stating she has not heard from [facility] and she did not expect to

## 2023-04-12 ENCOUNTER — Encounter: Payer: Self-pay | Admitting: Internal Medicine

## 2023-04-13 ENCOUNTER — Encounter (INDEPENDENT_AMBULATORY_CARE_PROVIDER_SITE_OTHER): Payer: Self-pay | Admitting: Vascular Surgery

## 2023-04-13 ENCOUNTER — Encounter: Payer: Self-pay | Admitting: Internal Medicine

## 2023-04-13 DIAGNOSIS — I890 Lymphedema, not elsewhere classified: Secondary | ICD-10-CM | POA: Insufficient documentation

## 2023-04-14 ENCOUNTER — Ambulatory Visit: Payer: Medicare Other | Admitting: Urology

## 2023-04-14 ENCOUNTER — Encounter: Payer: Self-pay | Admitting: Internal Medicine

## 2023-04-14 ENCOUNTER — Other Ambulatory Visit: Payer: Self-pay

## 2023-04-14 ENCOUNTER — Encounter: Payer: Self-pay | Admitting: Urology

## 2023-04-14 VITALS — BP 137/75 | HR 78 | Ht 64.0 in | Wt 213.0 lb

## 2023-04-14 DIAGNOSIS — N3281 Overactive bladder: Secondary | ICD-10-CM

## 2023-04-14 DIAGNOSIS — G4733 Obstructive sleep apnea (adult) (pediatric): Secondary | ICD-10-CM

## 2023-04-14 DIAGNOSIS — N3941 Urge incontinence: Secondary | ICD-10-CM | POA: Diagnosis not present

## 2023-04-14 LAB — MICROSCOPIC EXAMINATION

## 2023-04-14 LAB — URINALYSIS, COMPLETE
Bilirubin, UA: NEGATIVE
Glucose, UA: NEGATIVE
Ketones, UA: NEGATIVE
Leukocytes,UA: NEGATIVE
Nitrite, UA: NEGATIVE
Protein,UA: NEGATIVE
RBC, UA: NEGATIVE
Specific Gravity, UA: 1.015 (ref 1.005–1.030)
Urobilinogen, Ur: 0.2 mg/dL (ref 0.2–1.0)
pH, UA: 7 (ref 5.0–7.5)

## 2023-04-14 MED ORDER — TELMISARTAN 80 MG PO TABS: 80.0000 mg | ORAL_TABLET | Freq: Every day | ORAL | 1 refills | Status: DC

## 2023-04-14 MED ORDER — ONABOTULINUMTOXINA 100 UNITS IJ SOLR
100.0000 [IU] | Freq: Once | INTRAMUSCULAR | Status: AC
Start: 2023-04-14 — End: 2023-04-14
  Administered 2023-04-14: 100 [IU] via INTRAMUSCULAR

## 2023-04-14 NOTE — Progress Notes (Signed)
Bladder Instillation  Due to botox injections  patient is present today for a Bladder Instillation of 60 ml 2% lidocaine. Patient was cleaned and prepped in a sterile fashion with betadine and lidocaine 2% jelly was instilled into the urethra.  A 14FR catheter was inserted, urine return was noted 50ml, urine was clear and yellow  in color.  60 ml was instilled into the bladder. The catheter was then removed. Patient tolerated well, no complications were noted. Patient held in bladder for 30 minutes prior to procedure starting.   Performed by: Randa Lynn, RMA  Follow up/ Additional notes: 2 weeks

## 2023-04-14 NOTE — Telephone Encounter (Signed)
Called patient and gave her the number to the local branch she is assigned to so they can tell her what she needs to do about getting a machine.  [phone] Press 1

## 2023-04-14 NOTE — Telephone Encounter (Signed)
Patient returned your call . She called [facility] . She stated it did no good. She also stated what should she do she needs a CPAP machine.
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
## 2023-04-14 NOTE — Telephone Encounter (Signed)
See other note

## 2023-04-15 NOTE — Telephone Encounter (Signed)
Noted. Order signed

## 2023-04-15 NOTE — Telephone Encounter (Signed)
See other note

## 2023-04-15 NOTE — Telephone Encounter (Signed)
Order for cpap printed

## 2023-04-15 NOTE — Telephone Encounter (Signed)
DME ordered printed for you to sign so I can fax to [facility]. Will f/u with catie about ozempic

## 2023-04-17 ENCOUNTER — Ambulatory Visit (INDEPENDENT_AMBULATORY_CARE_PROVIDER_SITE_OTHER): Payer: Medicare Other | Admitting: Licensed Clinical Social Worker

## 2023-04-17 DIAGNOSIS — F331 Major depressive disorder, recurrent, moderate: Secondary | ICD-10-CM | POA: Diagnosis not present

## 2023-04-17 DIAGNOSIS — F419 Anxiety disorder, unspecified: Secondary | ICD-10-CM | POA: Diagnosis not present

## 2023-04-17 DIAGNOSIS — F4310 Post-traumatic stress disorder, unspecified: Secondary | ICD-10-CM

## 2023-04-17 NOTE — Telephone Encounter (Signed)
Pt called asking for her C pap machine prescription to be re-fax over again.

## 2023-04-18 ENCOUNTER — Telehealth: Payer: Self-pay | Admitting: Internal Medicine

## 2023-04-18 NOTE — Telephone Encounter (Signed)
Patient called about her CPAP machine. Patient stated that Bethann Berkshire was going to fax to [facility] order. Patient called [facility] and they do not have the order. Please refax order.

## 2023-04-20 ENCOUNTER — Encounter: Payer: Self-pay | Admitting: Internal Medicine

## 2023-04-22 NOTE — Telephone Encounter (Signed)
Pt calling to check on the status of her order for new CPAP to [facility].  Pt frustrated that she has been without a CPAP for over 2 weeks.  She felt that this should have been handled already.  Order for new CPAP was faxed to [facility] on 04/15/23 and we had a confirmation. Called [facility] and spoke with [place], pt representative and they had not received it.  Re faxed order and called later in the day to make sure [facility] had received it, per [facility] order was received.  Call to pt to let her know that [facility] had  received order.  Pt thanked me for the follow up.

## 2023-04-22 NOTE — Telephone Encounter (Signed)
Pt called in asking to speak to [facility] LPN. Unable to transfer call. Pt asked to speack to the office manager. I spoke to Hale Drone RN, and she will call pt back. Pt available @[phone]

## 2023-04-22 NOTE — Telephone Encounter (Signed)
Patient called and is mad that her CPAP orders have not gone through. She states she has been waiting for 2 damn weeks, and no one has called her.

## 2023-04-22 NOTE — Telephone Encounter (Signed)
See phone note

## 2023-04-23 ENCOUNTER — Telehealth: Payer: Self-pay | Admitting: *Deleted

## 2023-04-23 ENCOUNTER — Other Ambulatory Visit: Payer: Self-pay

## 2023-04-23 MED ORDER — SEMAGLUTIDE (1 MG/DOSE) 4 MG/3ML ~~LOC~~ SOPN: 1.0000 mg | PEN_INJECTOR | SUBCUTANEOUS | 12 refills | Status: DC

## 2023-04-23 NOTE — Telephone Encounter (Signed)
Patient notified via mychart that Patient Assistance Medication are in the office & are ready for pick up.   Medication: Ozempic 4mg /66ml Pen  Quantity: 4  Lot# [id number]  Exp: 09/24/2025

## 2023-04-23 NOTE — Progress Notes (Signed)
Dose updated on med list for ozempic.

## 2023-04-23 NOTE — Telephone Encounter (Signed)
Patient picked up patient assistance Ozempic 

## 2023-04-25 ENCOUNTER — Telehealth: Payer: Self-pay

## 2023-04-25 NOTE — Telephone Encounter (Signed)
See other note. I do not have sleep study to send. I do have compliance report. Left message for Aram Beecham at [facility] to determine if this is sufficient for what she needs.

## 2023-04-25 NOTE — Telephone Encounter (Signed)
Cinthia from [facility] called in stating that they need additional information on what C-pap machine request we sent over to them. She's available @ [phone] Ext#[phone]

## 2023-04-25 NOTE — Telephone Encounter (Signed)
LMTCB. I have compliance reports but do not have copy of sleep study to send. Will try to obtain records.

## 2023-04-25 NOTE — Telephone Encounter (Signed)
Patient states she would like for Benedict Needy, RN, to know that she needs a copy of her sleep study from 2020 and the notes from it faxed to [facility].  I let patient know that Benedict Needy, RN, is not in the office today.  Patient states she would like for [place] to please send the message to Dr. Charm Barges nurse.

## 2023-04-28 NOTE — Telephone Encounter (Signed)
Donnita Falls from [facility] walked in today asking for pt sleepy study and her last visit notes stating that why she need that c-pap machine. He said he will try and pass by again this afternoon. Any questions or concern, he's available [phone].

## 2023-04-28 NOTE — Telephone Encounter (Signed)
Spoke with Joe from [employer] and advised that we do not have a copy of the original sleep study. He is going to chck with Gavin Potters and with patient. He does need Dr Marina Goodell office note that states patient is using and benefiting from machine. Will send to him. Joe is supposed to let me know if he needs anything from our office. Joe stated that since he has to reach out to patient regarding sleep study, he will update patient on status of machine

## 2023-04-28 NOTE — Telephone Encounter (Signed)
See other note regarding conversation with [facility] rep.

## 2023-04-29 ENCOUNTER — Ambulatory Visit: Payer: Medicare Other | Admitting: Physician Assistant

## 2023-04-29 ENCOUNTER — Telehealth: Payer: Self-pay | Admitting: *Deleted
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
## 2023-04-29 NOTE — Telephone Encounter (Signed)
Spoke with Joe at [employer] to obtain fax number that sleep study results need to be sent to. Was advised by Joe to send with attention to [place] and kelly because he was working out of the office today. Faxed sleep study and received confirmation from fax machine that fax went through. Called [facility] to follow up and confirm received. Was given a secure email to send results to because they receive so many faxes per day- was advised this would get to them quicker. Also advised while on the phone with Joe that patient has requested a call from someone at [facility] today. Joe advised that someone would be contacting her.

## 2023-04-29 NOTE — Telephone Encounter (Signed)
Per review, it appears that pulmonary was previously prescribing her cpap.  The order was faxed and nurse was not aware they had not received the order.  Per discussion, Bethann Berkshire spoke with a representative from cpap company.  Also per discussion, order and study have been faxed.

## 2023-04-29 NOTE — Telephone Encounter (Signed)
Spoke with patient to update her on status from my end before leaving for the day. Patient advised that no one from [facility] has called her. Provided patient with the number for [facility] to call them. Will follow up with [facility].

## 2023-04-30 NOTE — Assessment & Plan Note (Signed)
Addendum - she has a history of sleep apnea.  Has been wearing cpap nightly and does benefit from the cpap.

## 2023-04-30 NOTE — Telephone Encounter (Signed)
Patient called and would like Kathryn Friedman to call her about her CPAP machine.

## 2023-04-30 NOTE — Telephone Encounter (Signed)
Spoke with Aram Beecham. Confirmed with her that sleep study was received. Advised if anything else is needed to please call us back and let me know so it can be taken care of. Aram Beecham gave direct extension and email for me to reach her if any further concerns. Called and updated patient that everything from our office has been done and confirmed received by [facility]. Patient said she has been going back and forth with them all day. Advised patient that per cynthia nothing else was needed from our office.

## 2023-04-30 NOTE — Telephone Encounter (Signed)
LM for Joe with [facility]

## 2023-05-01 ENCOUNTER — Ambulatory Visit (INDEPENDENT_AMBULATORY_CARE_PROVIDER_SITE_OTHER): Payer: Medicare Other | Admitting: Licensed Clinical Social Worker

## 2023-05-01 DIAGNOSIS — F331 Major depressive disorder, recurrent, moderate: Secondary | ICD-10-CM

## 2023-05-01 DIAGNOSIS — F4310 Post-traumatic stress disorder, unspecified: Secondary | ICD-10-CM | POA: Diagnosis not present

## 2023-05-01 DIAGNOSIS — F419 Anxiety disorder, unspecified: Secondary | ICD-10-CM | POA: Diagnosis not present

## 2023-05-02 ENCOUNTER — Telehealth: Payer: Self-pay

## 2023-05-02 ENCOUNTER — Other Ambulatory Visit: Payer: Medicare Other

## 2023-05-02 NOTE — Telephone Encounter (Signed)
  Called and left a message to confirm pharmacy    received a fax requesting a refill onthe duloxetine 60mg  pt wa last seen on 3-14 next appt 6-10

## 2023-05-02 NOTE — Telephone Encounter (Signed)
Please call and make sure that is the right pharmacy , since she had said something about [facility] last visit.  Thank you

## 2023-05-02 NOTE — Telephone Encounter (Signed)
Late entry: spoke with patient and was told that [employer] advised that they are missing documentation from our office. Advised patient that I have not received anything from [facility]. Called [facility] and left message with cynthia to follow up with me regarding patients CPAP. Patient also noted on the phone that she would like to have another sleep study done. Advised would have to be referred. She would like to see Freda Munro. Patient asked that I send her a my chart message to follow up with her before end of day. Will send my chart to let her know that I have not received anything from [facility].

## 2023-05-05 ENCOUNTER — Telehealth (INDEPENDENT_AMBULATORY_CARE_PROVIDER_SITE_OTHER): Payer: Medicare Other | Admitting: Psychiatry

## 2023-05-05 ENCOUNTER — Encounter: Payer: Self-pay | Admitting: Psychiatry

## 2023-05-05 ENCOUNTER — Encounter: Payer: Self-pay | Admitting: Internal Medicine

## 2023-05-05 ENCOUNTER — Ambulatory Visit: Payer: Medicare Other | Admitting: Physician Assistant

## 2023-05-05 VITALS — BP 127/71 | HR 79 | Ht 64.0 in | Wt 213.4 lb

## 2023-05-05 DIAGNOSIS — G4701 Insomnia due to medical condition: Secondary | ICD-10-CM

## 2023-05-05 DIAGNOSIS — F3342 Major depressive disorder, recurrent, in full remission: Secondary | ICD-10-CM

## 2023-05-05 DIAGNOSIS — F17210 Nicotine dependence, cigarettes, uncomplicated: Secondary | ICD-10-CM

## 2023-05-05 DIAGNOSIS — Z79899 Other long term (current) drug therapy: Secondary | ICD-10-CM | POA: Diagnosis not present

## 2023-05-05 DIAGNOSIS — N3281 Overactive bladder: Secondary | ICD-10-CM

## 2023-05-05 DIAGNOSIS — F17200 Nicotine dependence, unspecified, uncomplicated: Secondary | ICD-10-CM

## 2023-05-05 DIAGNOSIS — G2581 Restless legs syndrome: Secondary | ICD-10-CM | POA: Diagnosis not present

## 2023-05-05 DIAGNOSIS — Z634 Disappearance and death of family member: Secondary | ICD-10-CM

## 2023-05-05 DIAGNOSIS — F418 Other specified anxiety disorders: Secondary | ICD-10-CM | POA: Diagnosis not present

## 2023-05-05 LAB — MICROSCOPIC EXAMINATION: Bacteria, UA: NONE SEEN

## 2023-05-05 LAB — URINALYSIS, COMPLETE
Bilirubin, UA: NEGATIVE
Glucose, UA: NEGATIVE
Ketones, UA: NEGATIVE
Leukocytes,UA: NEGATIVE
Nitrite, UA: NEGATIVE
Protein,UA: NEGATIVE
RBC, UA: NEGATIVE
Specific Gravity, UA: <1.005 — ABNORMAL LOW (ref 1.005–1.030)
Urobilinogen, Ur: 0.2 mg/dL (ref 0.2–1.0)
pH, UA: 6 (ref 5.0–7.5)

## 2023-05-05 LAB — BLADDER SCAN AMB NON-IMAGING: Scan Result: 0

## 2023-05-05 MED ORDER — DULOXETINE HCL 60 MG PO CPEP: 60.0000 mg | ORAL_CAPSULE | Freq: Every day | ORAL | 1 refills | Status: DC

## 2023-05-05 MED ORDER — VARENICLINE TARTRATE 0.5 MG PO TABS
0.5000 mg | ORAL_TABLET | Freq: Every day | ORAL | 0 refills | Status: DC
Start: 2023-05-05 — End: 2023-06-24

## 2023-05-05 NOTE — Patient Instructions (Signed)
Varenicline Tablets What is this medication? VARENICLINE (var e NI kleen) helps you quit smoking. It reduces cravings for nicotine, the addictive substance found in tobacco. It is most effective when used in combination with a stop-smoking program. This medicine may be used for other purposes; ask your health care provider or pharmacist if you have questions. COMMON BRAND NAME(S): Chantix What should I tell my care team before I take this medication? They need to know if you have any of these conditions: Heart disease Frequently drink alcohol Kidney disease Mental health condition On hemodialysis Seizures History of stroke Suicidal thoughts, plans, or attempt by you or a family member An unusual or allergic reaction to varenicline, other medications, foods, dyes, or preservatives Pregnant or trying to get pregnant Breast-feeding How should I use this medication? Take this medication by mouth after eating. Take with a full glass of water. Follow the directions on the prescription label. Take your doses at regular intervals. Do not take your medication more often than directed. There are 3 ways you can use this medication to help you quit smoking; talk to your care team to decide which plan is right for you: 1) you can choose a quit date and start this medication 1 week before the quit date, or, 2) you can start taking this medication before you choose a quit date, and then pick a quit date between day 8 and 35 days of treatment, or, 3) if you are not sure that you are able or willing to quit smoking right away, start taking this medication and slowly decrease the amount you smoke as directed by your care team with the goal of being cigarette-free by week 12 of treatment. Stick to your plan; ask about support groups or other ways to help you remain cigarette-free. If you are motivated to quit smoking and did not succeed during a previous attempt with this medication for reasons other than side  effects, or if you returned to smoking after this treatment, speak with your care team about whether another course of this medication may be right for you. A special MedGuide will be given to you by the pharmacist with each prescription and refill. Be sure to read this information carefully each time. Talk to your care team about the use of this medication in children. This medication is not approved for use in children. Overdosage: If you think you have taken too much of this medicine contact a poison control center or emergency room at once. NOTE: This medicine is only for you. Do not share this medicine with others. What if I miss a dose? If you miss a dose, take it as soon as you can. If it is almost time for your next dose, take only that dose. Do not take double or extra doses. What may interact with this medication? Alcohol Insulin Other medications used to help people quit smoking Theophylline Warfarin This list may not describe all possible interactions. Give your health care provider a list of all the medicines, herbs, non-prescription drugs, or dietary supplements you use. Also tell them if you smoke, drink alcohol, or use illegal drugs. Some items may interact with your medicine. What should I watch for while using this medication? It is okay if you do not succeed at your attempt to quit and have a cigarette. You can still continue your quit attempt and keep using this medication as directed. Just throw away your cigarettes and get back to your quit plan. Talk to your care team   before using other treatments to quit smoking. Using this medication with other treatments to quit smoking may increase the risk for side effects compared to using a treatment alone. This medication may affect your coordination, reaction time, or judgment. Do not drive or operate machinery until you know how this medication affects you. Sit up or stand slowly to reduce the risk of dizzy or fainting  spells. Decrease the number of alcoholic beverages that you drink during treatment with this medication until you know if this medication affects your ability to tolerate alcohol. Some people have experienced increased drunkenness (intoxication), unusual or sometimes aggressive behavior, or no memory of things that have happened (amnesia) during treatment with this medication. You may do unusual sleep behaviors or activities you do not remember the day after taking this medication. Activities include driving, making or eating food, talking on the phone, sexual activity, or sleep walking. Stop taking this medication and call your care team right away if you find out you have done activities like this. Patients and their families should watch out for new or worsening depression or thoughts of suicide. Also watch out for sudden changes in feelings such as feeling anxious, agitated, panicky, irritable, hostile, aggressive, impulsive, severely restless, overly excited and hyperactive, or not being able to sleep. If this happens, call your care team. If you have diabetes, and you quit smoking, the effects of insulin may be increased. You may need to reduce your insulin dose. Check with your care team about how you should adjust your insulin dose. What side effects may I notice from receiving this medication? Side effects that you should report to your care team as soon as possible: Allergic reactions or angioedema--skin rash, itching or hives, swelling of the face, eyes, lips, tongue, arms, or legs, trouble swallowing or breathing Heart attack--pain or tightness in the chest, shoulders, arms, or jaw, nausea, shortness of breath, cold or clammy skin, feeling faint or lightheaded Mood and behavior changes--anxiety, nervousness, confusion, hallucinations, irritability, hostility, thoughts of suicide or self-harm, worsening mood, feelings of depression Redness, blistering, peeling, or loosening of the skin,  including inside the mouth Stroke--sudden numbness or weakness of the face, arm, or leg, trouble speaking, confusion, trouble walking, loss of balance or coordination, dizziness, severe headache, change in vision Seizures Side effects that usually do not require medical attention (report to your care team if they continue or are bothersome): Constipation Drowsiness Gas Nausea Trouble sleeping Upset stomach Vivid dreams or nightmares Vomiting This list may not describe all possible side effects. Call your doctor for medical advice about side effects. You may report side effects to FDA at 1-800-FDA-1088. Where should I keep my medication? Keep out of the reach of children and pets. Store at room temperature between 15 and 30 degrees C (59 and 86 degrees F). Throw away any unused medication after the expiration date. NOTE: This sheet is a summary. It may not cover all possible information. If you have questions about this medicine, talk to your doctor, pharmacist, or health care provider.  2024 [employer]/Gold Standard (2021-10-04 00:00:00)

## 2023-05-05 NOTE — Progress Notes (Unsigned)
Virtual Visit via Video Note  I connected with Kathryn Friedman on 05/05/23 at  4:00 PM EDT by a video enabled telemedicine application and verified that I am speaking with the correct person using two identifiers.  Location Provider Location : [facility] Patient Location : Home  Participants: Patient , Provider   I discussed the limitations of evaluation and management by telemedicine and the availability of in person appointments. The patient expressed understanding and agreed to proceed.    I discussed the assessment and treatment plan with the patient. The patient was provided an opportunity to ask questions and all were answered. The patient agreed with the plan and demonstrated an understanding of the instructions.   The patient was advised to call back or seek an in-person evaluation if the symptoms worsen or if the condition fails to improve as anticipated.   BH MD OP Progress Note  05/06/2023 12:33 PM Kathryn Friedman  MRN:  [id number]  Chief Complaint:  Chief Complaint  Patient presents with   Follow-up   Anxiety   Depression   Medication Refill   HPI: Kathryn Friedman is a 67 year old Caucasian female, widowed, retired, on [employer], lives in [place], has a history of MDD, insomnia, obstructive sleep apnea on CPAP, status post eye surgery, history of coronary artery disease status post stent placement, COPD, diabetes mellitus, hypertension, GERD, interstitial lung disease, iron deficiency was evaluated by telemedicine today.  Patient today reports she currently is struggling with sleep issues.  She has problems with her CPAP.  She has reached out to her providers to change it.  Patient otherwise reports overall mood symptoms are stable.  Denies any significant anxiety or depression symptoms.  Patient reports she is coping with her grief better.  She does have good support system.  Has someone who comes in to clean her home.  She is more active and has been getting out more.   Recently had a party that she attended.  Patient denies any suicidality, homicidality or perceptual disturbances.  Reports she is interested in smoking cessation.  Receptive to counseling.  Patient reports she previously thought Chantix may have worsened her depression however she had a lot going on at that time when she tried the Chantix.  She would like to try it again.  Patient reports she will keep an eye on her mood symptoms and if any worsening she will get help.  Patient denies any other concerns today.  Visit Diagnosis:    ICD-10-CM   1. MDD (major depressive disorder), recurrent, in full remission (HCC)  F33.42 DULoxetine (CYMBALTA) 60 MG capsule    2. Insomnia due to medical condition  G47.01    OSA,RLS    3. RLS (restless legs syndrome)  G25.81     4. Other specified anxiety disorders  F41.8    Limited symptom attacks    5. Bereavement  Z63.4     6. Tobacco use disorder  F17.200 varenicline (CHANTIX) 0.5 MG tablet      Past Psychiatric History: I have reviewed past psychiatric history from progress note on 06/26/2020.  Past trials of trazodone, Cymbalta, Lexapro, Rexulti, doxepin, gabapentin, Lunesta.  Patient was previously under the care of [employer] [facility].  Past Medical History:  Past Medical History:  Diagnosis Date   Anemia    Anginal pain (HCC)    Anxiety    Arthritis    back and knees   Asthma    Bilateral carotid artery stenosis    Blood in  stool    Chronic diarrhea    COPD (chronic obstructive pulmonary disease) (HCC)    Coronary artery disease    a.) 75% pRCA; 3.5 x 28 mm Cypher DES placed on 07/24/2006   Current use of long term anticoagulation    Clopidogrel   Depression    secondary to the death of her husband (died 05-23-1995)   Diverticulitis    Diverticulosis    Dizzinesses    Dysphagia    Dyspnea    Dysrhythmia    Fatty infiltration of liver    GERD (gastroesophageal reflux disease)    Headache    History of May 22, 2018 novel coronavirus  disease (COVID-19) 12/09/2020   History of 22-May-2018 novel coronavirus disease (COVID-19) 12/20/2020   Hypertension    Hypertriglyceridemia    ILD (interstitial lung disease) (HCC)    Lump in the abdomen    OSA on CPAP    Overactive bladder    PSVT (paroxysmal supraventricular tachycardia)    Spastic colon    T2DM (type 2 diabetes mellitus) (HCC) 05/2008   Tobacco abuse    Venous insufficiency of both lower extremities     Past Surgical History:  Procedure Laterality Date   ABDOMINAL HYSTERECTOMY  with left ovary in place May 23, 1995   APPENDECTOMY  1985   gallbladder and Appendix   BREAST BIOPSY Left 10/14/2017   calcs bx, fibrosis giant cell reaction and chronic inflammation, negative for malignancy.    CARPOMETACARPAL (CMC) FUSION OF THUMB Right 07/10/2022   Procedure: CARPOMETACARPAL (CMC) SUSPENSION OF RIGHT THUMB;  Surgeon: Christena Flake, MD;  Location: [facility] ORS;  Service: Orthopedics;  Laterality: Right;   CATARACT EXTRACTION, BILATERAL     CESAREAN SECTION  1984   CHOLECYSTECTOMY  1985   COLONOSCOPY WITH PROPOFOL N/A 09/13/2016   Procedure: COLONOSCOPY WITH PROPOFOL;  Surgeon: Scot Jun, MD;  Location: [facility] ENDOSCOPY;  Service: Endoscopy;  Laterality: N/A;   COLONOSCOPY WITH PROPOFOL N/A 11/09/2018   Procedure: COLONOSCOPY WITH PROPOFOL;  Surgeon: Scot Jun, MD;  Location: [facility] ENDOSCOPY;  Service: Endoscopy;  Laterality: N/A;   COLONOSCOPY WITH PROPOFOL N/A 03/29/2020   Procedure: COLONOSCOPY WITH PROPOFOL;  Surgeon: Earline Mayotte, MD;  Location: [facility] ENDOSCOPY;  Service: Endoscopy;  Laterality: N/A;   CORONARY ANGIOPLASTY WITH STENT PLACEMENT N/A 07/24/2006   75% pRCA; 3.5 x 28 mm Cypher DES placed; Location: [facility]; Surgeons: Rudean Hitt, MD   ESOPHAGOGASTRODUODENOSCOPY (EGD) WITH PROPOFOL N/A 02/02/2018   Procedure: ESOPHAGOGASTRODUODENOSCOPY (EGD) WITH PROPOFOL;  Surgeon: Scot Jun, MD;  Location: [facility] ENDOSCOPY;  Service: Endoscopy;  Laterality: N/A;    ESOPHAGOGASTRODUODENOSCOPY (EGD) WITH PROPOFOL N/A 03/29/2020   Procedure: ESOPHAGOGASTRODUODENOSCOPY (EGD) WITH PROPOFOL;  Surgeon: Earline Mayotte, MD;  Location: [facility] ENDOSCOPY;  Service: Endoscopy;  Laterality: N/A;   EYE SURGERY     JOINT REPLACEMENT     bilateral knee replacements   KNEE ARTHROSCOPY  Arthroscopic left knee surgery    KNEE SURGERY  status post knee surgey    LEFT HEART CATH AND CORONARY ANGIOGRAPHY Left 05/14/2018   Procedure: LEFT HEART CATH AND CORONARY ANGIOGRAPHY;  Surgeon: Lamar Blinks, MD;  Location: [facility] INVASIVE CV LAB;  Service: Cardiovascular;  Laterality: Left;   REPLACEMENT TOTAL KNEE  ([facility])   SHOULDER SURGERY  shoulder operation secondary to a torn tendon   TOTAL HIP ARTHROPLASTY Left 06/12/2021   Procedure: TOTAL HIP ARTHROPLASTY;  Surgeon: Christena Flake, MD;  Location: [facility] ORS;  Service: Orthopedics;  Laterality: Left;  Family Psychiatric History: I have reviewed family psychiatric history from progress note on 06/26/2020.  Family History:  Family History  Problem Relation Age of Onset   Other Mother        Hit by a fire truck and has had multiple operations on her back , and has history of MVP    Mitral valve prolapse Mother    Lung cancer Mother    Depression Mother    Heart disease Father        myocardial infarction and is status post bypass surgery   Mitral valve prolapse Sister    Bipolar disorder Sister    Hepatitis C Brother    Cirrhosis Brother    Colon cancer Paternal Aunt    Breast cancer Neg Hx    Prostate cancer Neg Hx    Bladder Cancer Neg Hx    Kidney cancer Neg Hx     Social History: I have reviewed social history from progress note on 06/26/2020. Social History   Socioeconomic History   Marital status: Widowed    Spouse name: Nelrose Wimbley   Number of children: 1   Years of education: 12   Highest education level: Associate degree: occupational, Scientist, product/process development, or vocational program  Occupational History     Employer: [employer]  Tobacco Use   Smoking status: Every Day    Packs/day: 2.00    Years: 45.00    Additional pack years: 0.00    Total pack years: 90.00    Types: Cigarettes    Passive exposure: Current   Smokeless tobacco: Never   Tobacco comments:    Smokes 18 cigarettes every day 12/24/22  Vaping Use   Vaping Use: Former   Substances: Flavoring  Substance and Sexual Activity   Alcohol use: No    Alcohol/week: 0.0 standard drinks of alcohol   Drug use: No   Sexual activity: Not Currently  Other Topics Concern   Not on file  Social History Narrative   Not on file   Social Determinants of Health   Financial Resource Strain: Low Risk  (03/13/2023)   Overall Financial Resource Strain (CARDIA)    Difficulty of Paying Living Expenses: Not very hard  Recent Concern: Financial Resource Strain - Medium Risk (03/11/2023)   Overall Financial Resource Strain (CARDIA)    Difficulty of Paying Living Expenses: Somewhat hard  Food Insecurity: Food Insecurity Present (03/13/2023)   Hunger Vital Sign    Worried About Running Out of Food in the Last Year: Sometimes true    Ran Out of Food in the Last Year: Never true  Transportation Needs: No Transportation Needs (03/13/2023)   PRAPARE - Administrator, Civil Service (Medical): No    Lack of Transportation (Non-Medical): No  Physical Activity: Inactive (03/13/2023)   Exercise Vital Sign    Days of Exercise per Week: 0 days    Minutes of Exercise per Session: 0 min  Stress: Stress Concern Present (03/13/2023)   [employer] of Occupational Health - Occupational Stress Questionnaire    Feeling of Stress : To some extent  Social Connections: Socially Isolated (03/13/2023)   Social Connection and Isolation Panel [NHANES]    Frequency of Communication with Friends and Family: More than three times a week    Frequency of Social Gatherings with Friends and Family: Twice a week    Attends Religious Services: Never    Doctor, general practice or Organizations: No    Attends Banker Meetings: Never  Marital Status: Widowed    Allergies:  Allergies  Allergen Reactions   Varenicline Other (See Comments)    "I got really depressed" "I got really depressed" CHANTEX   Varenicline Tartrate    Jardiance [Empagliflozin] Other (See Comments)    Yeast infection   Metformin And Related Other (See Comments)    Diarrhea, even with XR   Methylprednisolone Nausea Only and Nausea And Vomiting    Metabolic Disorder Labs: Lab Results  Component Value Date   HGBA1C 6.2 12/20/2022   No results found for: "PROLACTIN" Lab Results  Component Value Date   CHOL 119 12/20/2022   TRIG 106.0 12/20/2022   HDL 44.70 12/20/2022   CHOLHDL 3 12/20/2022   VLDL 21.2 12/20/2022   LDLCALC 53 12/20/2022   LDLCALC 46 06/17/2022   Lab Results  Component Value Date   TSH 1.23 06/17/2022   TSH 0.87 07/06/2021    Therapeutic Level Labs: No results found for: "LITHIUM" No results found for: "VALPROATE" No results found for: "CBMZ"  Current Medications: Current Outpatient Medications  Medication Sig Dispense Refill   varenicline (CHANTIX) 0.5 MG tablet Take 1 tablet (0.5 mg total) by mouth daily. 30 tablet 0   albuterol (VENTOLIN HFA) 108 (90 Base) MCG/ACT inhaler INHALE TWO PUFFS FOUR TIMES DAILY 8.5 g 11   amLODipine (NORVASC) 5 MG tablet Take 1 tablet (5 mg total) by mouth daily. 90 tablet 0   apixaban (ELIQUIS) 5 MG TABS tablet Take 1 tablet (5 mg total) by mouth 2 (two) times daily. 180 tablet 1   Budeson-Glycopyrrol-Formoterol (BREZTRI AEROSPHERE) 160-9-4.8 MCG/ACT AERO Inhale 2 puffs into the lungs in the morning and at bedtime. 32.1 g 3   CALCIUM PO Take 600 mg by mouth daily.      cholecalciferol (VITAMIN D3) 25 MCG (1000 UNIT) tablet Take 1,000 Units by mouth daily.     ciprofloxacin (CIPRO) 250 MG tablet Take 1 tablet the day before, 1 tablet the day of , 1 tablet the day after Botox 3 tablet 0   Continuous  Blood Gluc Receiver DEVI Use as directed to check blood sugars daily 1 each 0   Continuous Blood Gluc Sensor (DEXCOM G7 SENSOR) MISC Apply 1 sensor every 10 days. 3 each 3   Cyanocobalamin (VITAMIN B-12 PO) Take 2,500 mcg by mouth daily.     DULoxetine (CYMBALTA) 60 MG capsule Take 1 capsule (60 mg total) by mouth daily. 90 capsule 1   famotidine (PEPCID) 40 MG tablet Take 1 tablet (40 mg total) by mouth daily. 90 tablet 3   HM MELATONIN PO 5 mg by Other route at bedtime as needed. patch     isosorbide mononitrate (IMDUR) 30 MG 24 hr tablet Take 1 tablet (30 mg total) by mouth 2 (two) times daily. 90 tablet 3   Multiple Vitamin (MULTIVITAMIN) tablet Take 1 tablet by mouth daily.     mupirocin ointment (BACTROBAN) 2 % Apply 1 Application topically 2 (two) times daily. 22 g 0   nitroGLYCERIN (NITROSTAT) 0.4 MG SL tablet Place 1 tablet (0.4 mg total) under the tongue every 5 (five) minutes as needed for chest pain. 25 tablet 3   nystatin (MYCOSTATIN/NYSTOP) powder APPLY TO THE AFFECTED AREA(S) TWICE DAILY 30 g 0   pantoprazole (PROTONIX) 40 MG tablet TAKE 1 TABLET TWICE DAILY BEFORE MEALS 180 tablet 1   propranolol ER (INDERAL LA) 80 MG 24 hr capsule Take 1 capsule (80 mg total) by mouth 2 (two) times daily. 180 capsule 2  rOPINIRole (REQUIP) 0.5 MG tablet Take 1 tablet (0.5 mg total) by mouth daily after lunch. Take along with 3 mg , total of 3.5 mg daily 90 tablet 1   rOPINIRole (REQUIP) 3 MG tablet Take 1 tablet (3 mg total) by mouth at bedtime. Take along with 0.5 mg daily, total of 3.5 mg daily. 90 tablet 1   rosuvastatin (CRESTOR) 20 MG tablet Take 1 tablet (20 mg total) by mouth daily. 90 tablet 10   Semaglutide, 1 MG/DOSE, 4 MG/3ML SOPN Inject 1 mg as directed once a week. 3 mL 12   telmisartan (MICARDIS) 80 MG tablet Take 1 tablet (80 mg total) by mouth daily. 90 tablet 1   traZODone (DESYREL) 100 MG tablet Take 1.5-2 tablets (150-200 mg total) by mouth at bedtime. 180 tablet 1    triamcinolone cream (KENALOG) 0.1 % Apply 1 Application topically 2 (two) times daily. 30 g 0   trimethoprim (TRIMPEX) 100 MG tablet Take 1 tablet (100 mg total) by mouth daily. 90 tablet 3   Vibegron (GEMTESA) 75 MG TABS Take 1 tablet (75 mg total) by mouth daily. 90 tablet 3   No current facility-administered medications for this visit.     Musculoskeletal: Strength & Muscle Tone:  UTA Gait & Station:  Seated Patient leans: N/A  Psychiatric Specialty Exam: Review of Systems  Psychiatric/Behavioral:  Positive for sleep disturbance.     There were no vitals taken for this visit.There is no height or weight on file to calculate BMI.  General Appearance: Fairly Groomed  Eye Contact:  Good  Speech:  Clear and Coherent  Volume:  Normal  Mood:  Euthymic  Affect:  Congruent  Thought Process:  Goal Directed and Descriptions of Associations: Intact  Orientation:  Full (Time, Place, and Person)  Thought Content: Logical   Suicidal Thoughts:  No  Homicidal Thoughts:  No  Memory:  Immediate;   Fair Recent;   Fair Remote;   Fair  Judgement:  Fair  Insight:  Fair  Psychomotor Activity:  Normal  Concentration:  Concentration: Fair and Attention Span: Fair  Recall:  [employer] of Knowledge: Fair  Language: Fair  Akathisia:  No  Handed:  Right  AIMS (if indicated): not done  Assets:  Desire for Improvement Housing Social Support  ADL's:  Intact  Cognition: WNL  Sleep:   restless due to CPAP problem   Screenings: AIMS    Flowsheet Row Office Visit from 05/14/2022 in [facility] Video Visit from 04/03/2022 in [facility]  AIMS Total Score 0 0      GAD-7    Flowsheet Row Counselor from 12/20/2022 in [facility] Video Visit from 06/25/2021 in [facility] Video Visit from 05/03/2021 in [facility] Video Visit from 04/05/2021 in [facility]  Total GAD-7 Score 6 6 3 9       PHQ2-9    Flowsheet Row Office Visit from 03/28/2023 in [facility] [employer] [facility] Clinical Support from 03/17/2023 in [facility] [employer] from 12/20/2022 in [facility] Video Visit from 12/10/2022 in [facility] Video Visit from 11/27/2022 in [facility]  PHQ-2 Total Score 1 0 1 1 6   PHQ-9 Total Score 8 -- 8 -- 19      Flowsheet  Row Video Visit from 02/06/2023 in [facility] Counselor from 12/20/2022 in [facility] Video Visit from 12/10/2022 in [facility]  C-SSRS RISK CATEGORY No Risk No Risk No Risk        Assessment and Plan: Kathryn Friedman is a 67 year old Caucasian female who has a history of MDD, anxiety disorder, bereavement, sleep problems was evaluated by telemedicine today.  Patient is currently stable with regards to her mood symptoms although does have sleep issues due to noncompliance with CPAP.  Plan as noted below.  Plan MDD in full remission Cymbalta 60 mg p.o. daily Trazodone 150-200 mg p.o. daily Continue psychotherapy with Ms. Primus Bravo.  Other specified anxiety disorder with limited symptom attack-stable Cymbalta 60 mg p.o. daily Continues to be 80  Insomnia-unstable Patient currently not using CPAP since her CPAP device has problems.  She is trying to get it replaced.  Encouraged to continue CPAP for OSA Requip 3 mg at bedtime and 0.5 mg in the afternoon. Trazodone 150-200 mg at bedtime.  RLS-stable Requip as prescribed  Bereavement-stable Continue CBT  Tobacco use disorder-unstable Start Chantix 0.5 mg, low  dosage.  Patient to monitor for any worsening depression symptoms.  Follow-up in clinic in 1 month or sooner if needed.   Collaboration of Care: Collaboration of Care: Referral or follow-up with counselor/therapist AEB patient encouraged to continue CBT.  Patient/Guardian was advised Release of Information must be obtained prior to any record release in order to collaborate their care with an outside provider. Patient/Guardian was advised if they have not already done so to contact the registration department to sign all necessary forms in order for [place] to release information regarding their care.   Consent: Patient/Guardian gives verbal consent for treatment and assignment of benefits for services provided during this visit. Patient/Guardian expressed understanding and agreed to proceed.   This note was generated in part or whole with voice recognition software. Voice recognition is usually quite accurate but there are transcription errors that can and very often do occur. I apologize for any typographical errors that were not detected and corrected.    Jomarie Longs, MD 05/06/2023, 12:33 PM

## 2023-05-05 NOTE — Telephone Encounter (Signed)
pt confirmed that she uses the [facility] for her pharmacy.

## 2023-05-05 NOTE — Telephone Encounter (Signed)
left message to call office back to confrm pharmay

## 2023-05-05 NOTE — Telephone Encounter (Signed)
Patient had a visit today and medications were sent to correct pharmacy.
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
## 2023-05-06 ENCOUNTER — Telehealth: Payer: Self-pay | Admitting: Cardiovascular Disease

## 2023-05-06 ENCOUNTER — Encounter: Payer: Medicare Other | Admitting: *Deleted

## 2023-05-06 ENCOUNTER — Telehealth: Payer: Self-pay | Admitting: Internal Medicine

## 2023-05-06 ENCOUNTER — Other Ambulatory Visit: Payer: Self-pay | Admitting: *Deleted

## 2023-05-06 ENCOUNTER — Telehealth: Payer: Self-pay

## 2023-05-06 DIAGNOSIS — I480 Paroxysmal atrial fibrillation: Secondary | ICD-10-CM

## 2023-05-06 MED ORDER — APIXABAN 5 MG PO TABS
5.0000 mg | ORAL_TABLET | Freq: Two times a day (BID) | ORAL | 1 refills | Status: DC
Start: 2023-05-06 — End: 2023-08-19

## 2023-05-06 NOTE — Telephone Encounter (Signed)
called [facility] they states that the requip .5 and the 3mg  and , the trazodone. they missed the chantix but they will go ahead and get that ready. the other medications were mailed out today.

## 2023-05-06 NOTE — Telephone Encounter (Signed)
FYI

## 2023-05-06 NOTE — Telephone Encounter (Signed)
pt calle states that she needs a refill on the requip .5 and 3 mg , the trazodone and the chantix. needs to go to [facility]. she states that [facility] but it is a issues with getting medications.

## 2023-05-06 NOTE — Telephone Encounter (Signed)
left message that [facility] mailed out trazodone and bothe requips today and they missed the chantix but they will get that filled but it may go out tomorrow.

## 2023-05-06 NOTE — Telephone Encounter (Signed)
Eliquis 5mg  refill request received. Patient is 67 years old, weight-96.8kg, Crea-0.63 on 12/20/22, Diagnosis-Afib, and last seen by Dr. Mariah Milling on 12/24/22. Dose is appropriate based on dosing criteria. Will send in refill to requested pharmacy.

## 2023-05-06 NOTE — Telephone Encounter (Signed)
Refill request

## 2023-05-06 NOTE — Telephone Encounter (Signed)
Pt called requesting to speak to the office manager

## 2023-05-06 NOTE — Telephone Encounter (Signed)
Spoke with Patient with RN supervisor as witness, concerning call and Mychart discussed that nurse has called [employer] and that she has sent all paperwork as required, That [facility] is responsible for PA and to contact this office for any needs.that nurse has give [facility] normal documentation for a CPAP, sleep study, and OV notes, with insurance information. Patient stated she had just blessed nurse out but would call back and apologize. Patient stated she would follow up with [employer].

## 2023-05-06 NOTE — Telephone Encounter (Signed)
°*  STAT* If patient is at the pharmacy, call can be transferred to refill team. ° ° °1. Which medications need to be refilled? (please list name of each medication and dose if known) apixaban (ELIQUIS) 5 MG TABS tablet ° °2. Which pharmacy/location (including street and city if local pharmacy) is medication to be sent to? [facility] - [place], [place] - [address] ° °3. Do they need a 30 day or 90 day supply? 90 day  °

## 2023-05-07 ENCOUNTER — Encounter: Payer: Self-pay | Admitting: Internal Medicine

## 2023-05-07 ENCOUNTER — Telehealth (INDEPENDENT_AMBULATORY_CARE_PROVIDER_SITE_OTHER): Payer: Medicare Other | Admitting: Internal Medicine

## 2023-05-07 VITALS — BP 141/78 | HR 72 | Ht 64.0 in | Wt 210.0 lb

## 2023-05-07 DIAGNOSIS — E7800 Pure hypercholesterolemia, unspecified: Secondary | ICD-10-CM

## 2023-05-07 DIAGNOSIS — J849 Interstitial pulmonary disease, unspecified: Secondary | ICD-10-CM

## 2023-05-07 DIAGNOSIS — F17200 Nicotine dependence, unspecified, uncomplicated: Secondary | ICD-10-CM

## 2023-05-07 DIAGNOSIS — M7989 Other specified soft tissue disorders: Secondary | ICD-10-CM

## 2023-05-07 DIAGNOSIS — I700 Atherosclerosis of aorta: Secondary | ICD-10-CM | POA: Diagnosis not present

## 2023-05-07 DIAGNOSIS — F330 Major depressive disorder, recurrent, mild: Secondary | ICD-10-CM

## 2023-05-07 DIAGNOSIS — J449 Chronic obstructive pulmonary disease, unspecified: Secondary | ICD-10-CM

## 2023-05-07 DIAGNOSIS — I2510 Atherosclerotic heart disease of native coronary artery without angina pectoris: Secondary | ICD-10-CM

## 2023-05-07 DIAGNOSIS — K219 Gastro-esophageal reflux disease without esophagitis: Secondary | ICD-10-CM

## 2023-05-07 DIAGNOSIS — G4733 Obstructive sleep apnea (adult) (pediatric): Secondary | ICD-10-CM | POA: Diagnosis not present

## 2023-05-07 DIAGNOSIS — I10 Essential (primary) hypertension: Secondary | ICD-10-CM | POA: Diagnosis not present

## 2023-05-07 DIAGNOSIS — I480 Paroxysmal atrial fibrillation: Secondary | ICD-10-CM

## 2023-05-07 DIAGNOSIS — I779 Disorder of arteries and arterioles, unspecified: Secondary | ICD-10-CM

## 2023-05-07 DIAGNOSIS — E1159 Type 2 diabetes mellitus with other circulatory complications: Secondary | ICD-10-CM

## 2023-05-07 MED ORDER — AMLODIPINE BESYLATE 5 MG PO TABS: 5.0000 mg | ORAL_TABLET | Freq: Every day | ORAL | 3 refills | Status: DC

## 2023-05-07 MED ORDER — PANTOPRAZOLE SODIUM 40 MG PO TBEC: DELAYED_RELEASE_TABLET | ORAL | 3 refills | Status: DC
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
## 2023-05-08 ENCOUNTER — Other Ambulatory Visit: Payer: Self-pay

## 2023-05-08 ENCOUNTER — Telehealth: Payer: Self-pay

## 2023-05-08 ENCOUNTER — Other Ambulatory Visit: Payer: Medicare Other

## 2023-05-08 DIAGNOSIS — F17200 Nicotine dependence, unspecified, uncomplicated: Secondary | ICD-10-CM

## 2023-05-08 DIAGNOSIS — E1159 Type 2 diabetes mellitus with other circulatory complications: Secondary | ICD-10-CM

## 2023-05-08 MED ORDER — DEXCOM G7 SENSOR MISC
3 refills | Status: DC
Start: 2023-05-08 — End: 2023-05-13

## 2023-05-08 NOTE — Telephone Encounter (Signed)
I can send in a low dose of buproprion. Its an antidepressant , but helps with smoking cessation. Please let me know if she is interested.

## 2023-05-08 NOTE — Telephone Encounter (Signed)
Attempted to contact patient to discuss the new medication, to provide medication education, discuss side effects before sending the medication to the pharmacy.  Had to leave a voicemail.

## 2023-05-08 NOTE — Telephone Encounter (Signed)
pt called states that pharamcy said that the chantix was on back order can you send in something else.  Pt last seen on 6-10 next appt 7-30

## 2023-05-08 NOTE — Telephone Encounter (Signed)
pt states that would be fine. please send to [facility]  Pt was also told to check with the pharmacy later this afternoon

## 2023-05-09 MED ORDER — BUPROPION HCL 100 MG PO TABS
100.0000 mg | ORAL_TABLET | Freq: Every morning | ORAL | 0 refills | Status: DC
Start: 2023-05-09 — End: 2023-06-24

## 2023-05-09 NOTE — Telephone Encounter (Signed)
Pt returning your call

## 2023-05-09 NOTE — Telephone Encounter (Signed)
Returned call to patient.  Discussed starting bupropion 100 mg.  Discussed side effects including drug to drug interaction, effect on heart, blood pressure, lowering seizure threshold and other side effects.  Patient would like to try this , will monitor herself closely and get help immediately if she has any side effects.  I have sent bupropion 100 mg to pharmacy as requested.

## 2023-05-12 ENCOUNTER — Ambulatory Visit
Admission: RE | Admit: 2023-05-12 | Discharge: 2023-05-12 | Disposition: A | Discharge status: Home / Self Care | Payer: Medicare Other | Source: Ambulatory Visit | Attending: Internal Medicine | Admitting: Internal Medicine

## 2023-05-12 ENCOUNTER — Telehealth: Payer: Self-pay | Admitting: Internal Medicine

## 2023-05-12 DIAGNOSIS — Z1231 Encounter for screening mammogram for malignant neoplasm of breast: Secondary | ICD-10-CM | POA: Diagnosis not present

## 2023-05-12 NOTE — Telephone Encounter (Signed)
Prescription Request  05/12/2023  LOV: 03/28/2023  What is the name of the medication or equipment? Continuous Glucose Sensor (DEXCOM G7 SENSOR) MISC  Have you contacted your pharmacy to request a refill? Yes   Which pharmacy would you like this sent to?   [facility]) [facility] - [place], [place] - [address] [place] [address] Phone: [phone] Fax: [phone]   Patient notified that their request is being sent to the clinical staff for review and that they should receive a response within 2 business days.   Please advise at Mobile [phone] (mobile)

## 2023-05-13 ENCOUNTER — Other Ambulatory Visit: Payer: Self-pay

## 2023-05-13 ENCOUNTER — Other Ambulatory Visit: Payer: Medicare Other

## 2023-05-13 DIAGNOSIS — E1159 Type 2 diabetes mellitus with other circulatory complications: Secondary | ICD-10-CM

## 2023-05-13 MED ORDER — DEXCOM G7 SENSOR MISC
3 refills | Status: DC
Start: 2023-05-13 — End: 2023-12-31

## 2023-05-13 NOTE — Telephone Encounter (Signed)
Resent refill to [facility]

## 2023-05-14 ENCOUNTER — Encounter: Payer: Self-pay | Admitting: Internal Medicine

## 2023-05-14 ENCOUNTER — Other Ambulatory Visit (INDEPENDENT_AMBULATORY_CARE_PROVIDER_SITE_OTHER): Payer: Medicare Other

## 2023-05-14 DIAGNOSIS — N3281 Overactive bladder: Secondary | ICD-10-CM | POA: Diagnosis not present

## 2023-05-14 DIAGNOSIS — E1159 Type 2 diabetes mellitus with other circulatory complications: Secondary | ICD-10-CM | POA: Diagnosis not present

## 2023-05-14 DIAGNOSIS — E7800 Pure hypercholesterolemia, unspecified: Secondary | ICD-10-CM

## 2023-05-14 DIAGNOSIS — I10 Essential (primary) hypertension: Secondary | ICD-10-CM

## 2023-05-14 LAB — BASIC METABOLIC PANEL
BUN: 12 mg/dL (ref 6–23)
CO2: 28 mEq/L (ref 19–32)
Calcium: 8.9 mg/dL (ref 8.4–10.5)
Chloride: 102 mEq/L (ref 96–112)
Creatinine, Ser: 0.84 mg/dL (ref 0.40–1.20)
GFR: 72.03 mL/min (ref >60.00)
Glucose, Bld: 130 mg/dL — ABNORMAL HIGH (ref 70–99)
Potassium: 4.4 mEq/L (ref 3.5–5.1)
Sodium: 136 mEq/L (ref 135–145)

## 2023-05-14 LAB — HEPATIC FUNCTION PANEL
ALT: 11 U/L (ref 0–35)
AST: 15 U/L (ref 0–37)
Albumin: 3.6 g/dL (ref 3.5–5.2)
Alkaline Phosphatase: 67 U/L (ref 39–117)
Bilirubin, Direct: 0.1 mg/dL (ref 0.0–0.3)
Total Bilirubin: 0.4 mg/dL (ref 0.2–1.2)
Total Protein: 6.5 g/dL (ref 6.0–8.3)

## 2023-05-14 LAB — LIPID PANEL
Cholesterol: 99 mg/dL (ref 0–200)
HDL: 37.9 mg/dL — ABNORMAL LOW (ref >39.00)
LDL Cholesterol: 44 mg/dL (ref 0–99)
NonHDL: 60.65
Total CHOL/HDL Ratio: 3
Triglycerides: 83 mg/dL (ref 0.0–149.0)
VLDL: 16.6 mg/dL (ref 0.0–40.0)

## 2023-05-14 LAB — HEMOGLOBIN A1C: Hgb A1c MFr Bld: 6.3 % (ref 4.6–6.5)

## 2023-05-14 NOTE — Assessment & Plan Note (Signed)
Sees Dr Fleming.  Using breztri.  Breathing stable.  No increased sob, cough or congestion. Dr Fleming recommended f/u chest CT in 02/2023 -. Lung-RADS 2S, benign appearance or behavior. Continue annual screening with low-dose chest CT without contrast in 12 months.  Mild diffuse bronchial wall thickening with mild centrilobular and paraseptal emphysema; imaging findings suggestive of underlying COPD. 

## 2023-05-14 NOTE — Assessment & Plan Note (Signed)
No upper symptoms reported.  On protonix.   

## 2023-05-14 NOTE — Assessment & Plan Note (Signed)
Breztri.  Breathing stable.  

## 2023-05-14 NOTE — Assessment & Plan Note (Signed)
S/p stent placement.  Continue lipitor. Followed by cardiology.  On eliquis.    

## 2023-05-14 NOTE — Assessment & Plan Note (Signed)
Low carb diet and exercise.  Follow met b and a1c. Continue GLP - 1 agonist.  No changes.

## 2023-05-14 NOTE — Assessment & Plan Note (Signed)
Being followed by psychiatry.  On cymbalta.  Overall appears to be doing better.  Follow.

## 2023-05-14 NOTE — Assessment & Plan Note (Signed)
Addendum - she has a history of sleep apnea.  Has been wearing cpap nightly and does benefit from the cpap. Needs a new machine.  The information has been provided to the company.  Hopefully can get her machine soon.  Follow.

## 2023-05-14 NOTE — Assessment & Plan Note (Signed)
Continue lipitor  ?

## 2023-05-14 NOTE — Assessment & Plan Note (Signed)
On crestor.  Low cholesterol diet and exercise.  Follow lipid panel and liver function tests.   

## 2023-05-14 NOTE — Assessment & Plan Note (Signed)
Continue lipitor.  Continue eliquis.   

## 2023-05-14 NOTE — Assessment & Plan Note (Signed)
Has been trying to quit.  Recently started on chantix by psychiatry.  Follow.

## 2023-05-14 NOTE — Assessment & Plan Note (Signed)
On eliquis.  Stable.  Continue f/u with cardiology.  

## 2023-05-14 NOTE — Assessment & Plan Note (Signed)
Dr Lorretta Harp - 04/07/23 - recommend lower extremity ultrasound.  Compression hose.

## 2023-05-14 NOTE — Assessment & Plan Note (Signed)
Continue micardis 80mg q day and amlodipine 5mg q day.  Dr Gollan increased propranolol. Blood pressures have been under reasonable control. Follow pressures.  Follow metabolic panel. Hold on changing medication.  Follow.   

## 2023-05-15 ENCOUNTER — Encounter: Payer: Self-pay | Admitting: Internal Medicine

## 2023-05-15 ENCOUNTER — Ambulatory Visit (INDEPENDENT_AMBULATORY_CARE_PROVIDER_SITE_OTHER): Payer: Medicare Other | Admitting: Licensed Clinical Social Worker

## 2023-05-15 DIAGNOSIS — F4310 Post-traumatic stress disorder, unspecified: Secondary | ICD-10-CM | POA: Diagnosis not present

## 2023-05-15 DIAGNOSIS — F3342 Major depressive disorder, recurrent, in full remission: Secondary | ICD-10-CM

## 2023-05-15 DIAGNOSIS — G4733 Obstructive sleep apnea (adult) (pediatric): Secondary | ICD-10-CM | POA: Diagnosis not present

## 2023-05-16 LAB — URINALYSIS, COMPLETE

## 2023-05-19 ENCOUNTER — Ambulatory Visit: Payer: Medicare Other | Admitting: Internal Medicine

## 2023-05-21 DIAGNOSIS — M65312 Trigger thumb, left thumb: Secondary | ICD-10-CM | POA: Diagnosis not present

## 2023-05-21 LAB — CULTURE, URINE COMPREHENSIVE

## 2023-05-26 DIAGNOSIS — G4733 Obstructive sleep apnea (adult) (pediatric): Secondary | ICD-10-CM | POA: Diagnosis not present

## 2023-05-28 ENCOUNTER — Ambulatory Visit (INDEPENDENT_AMBULATORY_CARE_PROVIDER_SITE_OTHER): Payer: Medicare Other | Admitting: Licensed Clinical Social Worker

## 2023-05-28 DIAGNOSIS — F330 Major depressive disorder, recurrent, mild: Secondary | ICD-10-CM

## 2023-05-28 DIAGNOSIS — F4310 Post-traumatic stress disorder, unspecified: Secondary | ICD-10-CM

## 2023-05-28 DIAGNOSIS — F419 Anxiety disorder, unspecified: Secondary | ICD-10-CM | POA: Diagnosis not present
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
## 2023-06-02 ENCOUNTER — Ambulatory Visit: Payer: Medicare Other | Admitting: Internal Medicine

## 2023-06-02 ENCOUNTER — Telehealth: Payer: Self-pay | Admitting: Internal Medicine

## 2023-06-02 NOTE — Telephone Encounter (Signed)
Lvm to r/s 06/03/23 appointment. Patient lvm to r/s-Toni

## 2023-06-03 ENCOUNTER — Ambulatory Visit: Payer: Medicare Other | Admitting: Internal Medicine

## 2023-06-03 DIAGNOSIS — Z79899 Other long term (current) drug therapy: Secondary | ICD-10-CM | POA: Diagnosis not present

## 2023-06-09 DIAGNOSIS — M65312 Trigger thumb, left thumb: Secondary | ICD-10-CM | POA: Diagnosis not present

## 2023-06-12 ENCOUNTER — Ambulatory Visit (INDEPENDENT_AMBULATORY_CARE_PROVIDER_SITE_OTHER): Payer: Medicare Other | Admitting: Licensed Clinical Social Worker

## 2023-06-12 DIAGNOSIS — F419 Anxiety disorder, unspecified: Secondary | ICD-10-CM | POA: Diagnosis not present

## 2023-06-12 DIAGNOSIS — F330 Major depressive disorder, recurrent, mild: Secondary | ICD-10-CM | POA: Diagnosis not present

## 2023-06-12 DIAGNOSIS — F4310 Post-traumatic stress disorder, unspecified: Secondary | ICD-10-CM

## 2023-06-16 ENCOUNTER — Ambulatory Visit: Payer: Medicare Other | Admitting: Internal Medicine

## 2023-06-24 ENCOUNTER — Telehealth (INDEPENDENT_AMBULATORY_CARE_PROVIDER_SITE_OTHER): Payer: Medicare Other | Admitting: Psychiatry

## 2023-06-24 ENCOUNTER — Encounter: Payer: Self-pay | Admitting: Psychiatry

## 2023-06-24 DIAGNOSIS — F418 Other specified anxiety disorders: Secondary | ICD-10-CM | POA: Diagnosis not present

## 2023-06-24 DIAGNOSIS — G4701 Insomnia due to medical condition: Secondary | ICD-10-CM

## 2023-06-24 DIAGNOSIS — F17210 Nicotine dependence, cigarettes, uncomplicated: Secondary | ICD-10-CM

## 2023-06-24 DIAGNOSIS — G2581 Restless legs syndrome: Secondary | ICD-10-CM

## 2023-06-24 DIAGNOSIS — Z634 Disappearance and death of family member: Secondary | ICD-10-CM

## 2023-06-24 DIAGNOSIS — F17200 Nicotine dependence, unspecified, uncomplicated: Secondary | ICD-10-CM

## 2023-06-24 DIAGNOSIS — F3342 Major depressive disorder, recurrent, in full remission: Secondary | ICD-10-CM

## 2023-06-24 MED ORDER — BUPROPION HCL ER (SR) 100 MG PO TB12
100.0000 mg | ORAL_TABLET | Freq: Two times a day (BID) | ORAL | 0 refills | Status: DC
Start: 2023-06-24 — End: 2023-08-04

## 2023-06-24 MED ORDER — NICOTINE 7 MG/24HR TD PT24
7.0000 mg | MEDICATED_PATCH | Freq: Every day | TRANSDERMAL | 2 refills | Status: DC
Start: 2023-06-24 — End: 2023-08-04

## 2023-06-24 NOTE — Progress Notes (Signed)
Virtual Visit via Video Note  I connected with Kathryn Friedman on 06/24/23 at  9:00 AM EDT by a video enabled telemedicine application and verified that I am speaking with the correct person using two identifiers.  Location Provider Location : [facility] Patient Location : Home  Participants: Patient , Provider    I discussed the limitations of evaluation and management by telemedicine and the availability of in person appointments. The patient expressed understanding and agreed to proceed.   I discussed the assessment and treatment plan with the patient. The patient was provided an opportunity to ask questions and all were answered. The patient agreed with the plan and demonstrated an understanding of the instructions.   The patient was advised to call back or seek an in-person evaluation if the symptoms worsen or if the condition fails to improve as anticipated.   BH MD OP Progress Note  06/24/2023 9:26 AM Kathryn Friedman  MRN:  [id number]  Chief Complaint:  Chief Complaint  Patient presents with   Follow-up   Anxiety   Depression   HPI: Kathryn Friedman is a 67 year old Caucasian female, widowed, retired, on [employer], lives in [place], has a history of MDD, insomnia, obstructive sleep apnea on CPAP, history of coronary artery disease status post stent placement, COPD, diabetes mellitus, hypertension, GERD, interstitial lung disease, iron deficiency was evaluated by telemedicine today.  Patient today reports mood symptoms are stable.  She is coping with her grief better.  Continues to stay active.  Continues to have good support system from friends.  Patient reports sleep has improved..  Denies any changes in her appetite.  Patient is currently compliant on her medications like Cymbalta, denies side effects.  Patient continues to struggle with smoking.  She reports she tried the Wellbutrin and she is tolerating it well.  However that has not helped with smoking.  She started it a  month ago.  She also tried nicotine patches or gums by itself.  That did not work.  She reports she had several sessions by [facility] quit now program over the phone.  That also did not seem to work.  Agreeable to dosage increase of Wellbutrin.  Patient denies any suicidality, homicidality or perceptual disturbances.  Patient denies any other concerns today.  Visit Diagnosis:    ICD-10-CM   1. MDD (major depressive disorder), recurrent, in full remission (HCC)  F33.42     2. Insomnia due to medical condition  G47.01    OSA, RLS    3. RLS (restless legs syndrome)  G25.81     4. Other specified anxiety disorders  F41.8    Limited symptom attacks    5. Bereavement  Z63.4     6. Tobacco use disorder  F17.200 buPROPion ER (WELLBUTRIN SR) 100 MG 12 hr tablet    nicotine (NICODERM CQ - DOSED IN MG/24 HR) 7 mg/24hr patch      Past Psychiatric History: I have reviewed past psychiatric history from progress note on 06/26/2020.  Past trials of trazodone, Cymbalta, Lexapro, Rexulti, doxepin, gabapentin, Lunesta.  Patient was previously under the care of [facility].  Past Medical History:  Past Medical History:  Diagnosis Date   Anemia    Anginal pain (HCC)    Anxiety    Arthritis    back and knees   Asthma    Bilateral carotid artery stenosis    Blood in stool    Chronic diarrhea    COPD (chronic obstructive pulmonary disease) (HCC)  Coronary artery disease    a.) 75% pRCA; 3.5 x 28 mm Cypher DES placed on 07/24/2006   Current use of long term anticoagulation    Clopidogrel   Depression    secondary to the death of her husband (died July 02, 1995)   Diverticulitis    Diverticulosis    Dizzinesses    Dysphagia    Dyspnea    Dysrhythmia    Fatty infiltration of liver    GERD (gastroesophageal reflux disease)    Headache    History of 07/01/2018 novel coronavirus disease (COVID-19) 12/09/2020   History of Jul 01, 2018 novel coronavirus disease (COVID-19) 12/20/2020   Hypertension     Hypertriglyceridemia    ILD (interstitial lung disease) (HCC)    Lump in the abdomen    OSA on CPAP    Overactive bladder    PSVT (paroxysmal supraventricular tachycardia)    Spastic colon    T2DM (type 2 diabetes mellitus) (HCC) 05/2008   Tobacco abuse    Venous insufficiency of both lower extremities     Past Surgical History:  Procedure Laterality Date   ABDOMINAL HYSTERECTOMY  with left ovary in place 07/02/1995   APPENDECTOMY  1985   gallbladder and Appendix   BREAST BIOPSY Left 10/14/2017   calcs bx, fibrosis giant cell reaction and chronic inflammation, negative for malignancy.    CARPOMETACARPAL (CMC) FUSION OF THUMB Right 07/10/2022   Procedure: CARPOMETACARPAL (CMC) SUSPENSION OF RIGHT THUMB;  Surgeon: Christena Flake, MD;  Location: [facility] ORS;  Service: Orthopedics;  Laterality: Right;   CATARACT EXTRACTION, BILATERAL     CESAREAN SECTION  1984   CHOLECYSTECTOMY  1985   COLONOSCOPY WITH PROPOFOL N/A 09/13/2016   Procedure: COLONOSCOPY WITH PROPOFOL;  Surgeon: Scot Jun, MD;  Location: [facility] ENDOSCOPY;  Service: Endoscopy;  Laterality: N/A;   COLONOSCOPY WITH PROPOFOL N/A 11/09/2018   Procedure: COLONOSCOPY WITH PROPOFOL;  Surgeon: Scot Jun, MD;  Location: [facility] ENDOSCOPY;  Service: Endoscopy;  Laterality: N/A;   COLONOSCOPY WITH PROPOFOL N/A 03/29/2020   Procedure: COLONOSCOPY WITH PROPOFOL;  Surgeon: Earline Mayotte, MD;  Location: [facility] ENDOSCOPY;  Service: Endoscopy;  Laterality: N/A;   CORONARY ANGIOPLASTY WITH STENT PLACEMENT N/A 07/24/2006   75% pRCA; 3.5 x 28 mm Cypher DES placed; Location: [facility]; Surgeons: Rudean Hitt, MD   ESOPHAGOGASTRODUODENOSCOPY (EGD) WITH PROPOFOL N/A 02/02/2018   Procedure: ESOPHAGOGASTRODUODENOSCOPY (EGD) WITH PROPOFOL;  Surgeon: Scot Jun, MD;  Location: [facility] ENDOSCOPY;  Service: Endoscopy;  Laterality: N/A;   ESOPHAGOGASTRODUODENOSCOPY (EGD) WITH PROPOFOL N/A 03/29/2020   Procedure: ESOPHAGOGASTRODUODENOSCOPY (EGD) WITH  PROPOFOL;  Surgeon: Earline Mayotte, MD;  Location: [facility] ENDOSCOPY;  Service: Endoscopy;  Laterality: N/A;   EYE SURGERY     JOINT REPLACEMENT     bilateral knee replacements   KNEE ARTHROSCOPY  Arthroscopic left knee surgery    KNEE SURGERY  status post knee surgey    LEFT HEART CATH AND CORONARY ANGIOGRAPHY Left 05/14/2018   Procedure: LEFT HEART CATH AND CORONARY ANGIOGRAPHY;  Surgeon: Lamar Blinks, MD;  Location: [facility] INVASIVE CV LAB;  Service: Cardiovascular;  Laterality: Left;   REPLACEMENT TOTAL KNEE  ([facility])   SHOULDER SURGERY  shoulder operation secondary to a torn tendon   TOTAL HIP ARTHROPLASTY Left 06/12/2021   Procedure: TOTAL HIP ARTHROPLASTY;  Surgeon: Christena Flake, MD;  Location: [facility] ORS;  Service: Orthopedics;  Laterality: Left;    Family Psychiatric History: I have reviewed family psychiatric history from progress note on 06/26/2020.  Family History:  Family History  Problem Relation Age of Onset   Other Mother        Hit by a fire truck and has had multiple operations on her back , and has history of MVP    Mitral valve prolapse Mother    Lung cancer Mother    Depression Mother    Heart disease Father        myocardial infarction and is status post bypass surgery   Mitral valve prolapse Sister    Bipolar disorder Sister    Hepatitis C Brother    Cirrhosis Brother    Colon cancer Paternal Aunt    Breast cancer Neg Hx    Prostate cancer Neg Hx    Bladder Cancer Neg Hx    Kidney cancer Neg Hx     Social History: I have reviewed social history from progress note on 06/26/2020. Social History   Socioeconomic History   Marital status: Widowed    Spouse name: Kaely Hartman   Number of children: 1   Years of education: 12   Highest education level: Associate degree: occupational, Scientist, product/process development, or vocational program  Occupational History    Employer: [employer]  Tobacco Use   Smoking status: Every Day    Current packs/day: 2.00    Average packs/day: 2.0 packs/day  for 45.0 years (90.0 ttl pk-yrs)    Types: Cigarettes    Passive exposure: Current   Smokeless tobacco: Never   Tobacco comments:    Smokes 18 cigarettes every day 12/24/22  Vaping Use   Vaping status: Former   Substances: Flavoring  Substance and Sexual Activity   Alcohol use: No    Alcohol/week: 0.0 standard drinks of alcohol   Drug use: No   Sexual activity: Not Currently  Other Topics Concern   Not on file  Social History Narrative   Not on file   Social Determinants of Health   Financial Resource Strain: Low Risk  (03/13/2023)   Overall Financial Resource Strain (CARDIA)    Difficulty of Paying Living Expenses: Not very hard  Recent Concern: Financial Resource Strain - Medium Risk (03/11/2023)   Overall Financial Resource Strain (CARDIA)    Difficulty of Paying Living Expenses: Somewhat hard  Food Insecurity: Food Insecurity Present (03/13/2023)   Hunger Vital Sign    Worried About Running Out of Food in the Last Year: Sometimes true    Ran Out of Food in the Last Year: Never true  Transportation Needs: No Transportation Needs (03/13/2023)   PRAPARE - Administrator, Civil Service (Medical): No    Lack of Transportation (Non-Medical): No  Physical Activity: Inactive (03/13/2023)   Exercise Vital Sign    Days of Exercise per Week: 0 days    Minutes of Exercise per Session: 0 min  Stress: Stress Concern Present (03/13/2023)   [employer] of Occupational Health - Occupational Stress Questionnaire    Feeling of Stress : To some extent  Social Connections: Socially Isolated (03/13/2023)   Social Connection and Isolation Panel [NHANES]    Frequency of Communication with Friends and Family: More than three times a week    Frequency of Social Gatherings with Friends and Family: Twice a week    Attends Religious Services: Never    Database administrator or Organizations: No    Attends Banker Meetings: Never    Marital Status: Widowed     Allergies:  Allergies  Allergen Reactions   Varenicline Other (See Comments)    "I  got really depressed" "I got really depressed" CHANTEX   Varenicline Tartrate    Jardiance [Empagliflozin] Other (See Comments)    Yeast infection   Metformin And Related Other (See Comments)    Diarrhea, even with XR   Methylprednisolone Nausea Only and Nausea And Vomiting    Metabolic Disorder Labs: Lab Results  Component Value Date   HGBA1C 6.3 05/14/2023   No results found for: "PROLACTIN" Lab Results  Component Value Date   CHOL 99 05/14/2023   TRIG 83.0 05/14/2023   HDL 37.90 (L) 05/14/2023   CHOLHDL 3 05/14/2023   VLDL 16.6 05/14/2023   LDLCALC 44 05/14/2023   LDLCALC 53 12/20/2022   Lab Results  Component Value Date   TSH 1.23 06/17/2022   TSH 0.87 07/06/2021    Therapeutic Level Labs: No results found for: "LITHIUM" No results found for: "VALPROATE" No results found for: "CBMZ"  Current Medications: Current Outpatient Medications  Medication Sig Dispense Refill   albuterol (VENTOLIN HFA) 108 (90 Base) MCG/ACT inhaler INHALE TWO PUFFS FOUR TIMES DAILY 8.5 g 11   amLODipine (NORVASC) 5 MG tablet Take 1 tablet (5 mg total) by mouth daily. 90 tablet 3   apixaban (ELIQUIS) 5 MG TABS tablet Take 1 tablet (5 mg total) by mouth 2 (two) times daily. 180 tablet 1   Budeson-Glycopyrrol-Formoterol (BREZTRI AEROSPHERE) 160-9-4.8 MCG/ACT AERO Inhale 2 puffs into the lungs in the morning and at bedtime. 32.1 g 3   buPROPion ER (WELLBUTRIN SR) 100 MG 12 hr tablet Take 1 tablet (100 mg total) by mouth 2 (two) times daily. Take 1 tablet daily at 8 AM and 1 tablet daily at 2 PM 180 tablet 0   CALCIUM PO Take 600 mg by mouth daily.      cholecalciferol (VITAMIN D3) 25 MCG (1000 UNIT) tablet Take 1,000 Units by mouth daily.     ciprofloxacin (CIPRO) 250 MG tablet Take 1 tablet the day before, 1 tablet the day of , 1 tablet the day after Botox 3 tablet 0   Continuous Blood Gluc Receiver  DEVI Use as directed to check blood sugars daily 1 each 0   Continuous Glucose Sensor (DEXCOM G7 SENSOR) MISC Apply 1 sensor every 10 days. 3 each 3   Cyanocobalamin (VITAMIN B-12 PO) Take 2,500 mcg by mouth daily.     DULoxetine (CYMBALTA) 60 MG capsule Take 1 capsule (60 mg total) by mouth daily. 90 capsule 1   famotidine (PEPCID) 40 MG tablet Take 1 tablet (40 mg total) by mouth daily. 90 tablet 3   HM MELATONIN PO 5 mg by Other route at bedtime as needed. patch     isosorbide mononitrate (IMDUR) 30 MG 24 hr tablet Take 1 tablet (30 mg total) by mouth 2 (two) times daily. 90 tablet 3   Multiple Vitamin (MULTIVITAMIN) tablet Take 1 tablet by mouth daily.     mupirocin ointment (BACTROBAN) 2 % Apply 1 Application topically 2 (two) times daily. 22 g 0   nicotine (NICODERM CQ - DOSED IN MG/24 HR) 7 mg/24hr patch Place 1 patch (7 mg total) onto the skin daily. 28 patch 2   nitroGLYCERIN (NITROSTAT) 0.4 MG SL tablet Place 1 tablet (0.4 mg total) under the tongue every 5 (five) minutes as needed for chest pain. 25 tablet 3   nystatin (MYCOSTATIN/NYSTOP) powder APPLY TO THE AFFECTED AREA(S) TWICE DAILY 30 g 0   pantoprazole (PROTONIX) 40 MG tablet TAKE 1 TABLET TWICE DAILY BEFORE MEALS 180 tablet 3  propranolol ER (INDERAL LA) 80 MG 24 hr capsule Take 1 capsule (80 mg total) by mouth 2 (two) times daily. 180 capsule 2   rOPINIRole (REQUIP) 0.5 MG tablet Take 1 tablet (0.5 mg total) by mouth daily after lunch. Take along with 3 mg , total of 3.5 mg daily 90 tablet 1   rOPINIRole (REQUIP) 3 MG tablet Take 1 tablet (3 mg total) by mouth at bedtime. Take along with 0.5 mg daily, total of 3.5 mg daily. 90 tablet 1   rosuvastatin (CRESTOR) 20 MG tablet Take 1 tablet (20 mg total) by mouth daily. 90 tablet 10   Semaglutide, 1 MG/DOSE, 4 MG/3ML SOPN Inject 1 mg as directed once a week. 3 mL 12   telmisartan (MICARDIS) 80 MG tablet Take 1 tablet (80 mg total) by mouth daily. 90 tablet 1   traZODone (DESYREL)  100 MG tablet Take 1.5-2 tablets (150-200 mg total) by mouth at bedtime. 180 tablet 1   triamcinolone cream (KENALOG) 0.1 % Apply 1 Application topically 2 (two) times daily. 30 g 0   trimethoprim (TRIMPEX) 100 MG tablet Take 1 tablet (100 mg total) by mouth daily. 90 tablet 3   Vibegron (GEMTESA) 75 MG TABS Take 1 tablet (75 mg total) by mouth daily. 90 tablet 3   No current facility-administered medications for this visit.     Musculoskeletal: Strength & Muscle Tone:  UTA Gait & Station: normal Patient leans: N/A  Psychiatric Specialty Exam: Review of Systems  Psychiatric/Behavioral: Negative.      There were no vitals taken for this visit.There is no height or weight on file to calculate BMI.  General Appearance: Casual  Eye Contact:  Fair  Speech:  Normal Rate  Volume:  Normal  Mood:  Euthymic  Affect:  Appropriate  Thought Process:  Goal Directed and Descriptions of Associations: Intact  Orientation:  Full (Time, Place, and Person)  Thought Content: Logical   Suicidal Thoughts:  No  Homicidal Thoughts:  No  Memory:  Immediate;   Fair Recent;   Fair Remote;   Fair  Judgement:  Intact  Insight:  Fair  Psychomotor Activity:  Normal  Concentration:  Concentration: Fair and Attention Span: Fair  Recall:  Good  Fund of Knowledge: Good  Language: Good  Akathisia:  No  Handed:  Right  AIMS (if indicated): not done  Assets:  Communication Skills Desire for Improvement Housing Social Support  ADL's:  Intact  Cognition: WNL  Sleep:  Fair   Screenings: Midwife Visit from 05/14/2022 in [facility] Video Visit from 04/03/2022 in [facility]  AIMS Total Score 0 0      GAD-7    Flowsheet Row Counselor from 12/20/2022 in [facility] Video Visit from 06/25/2021 in [facility] Video Visit from  05/03/2021 in [facility] Video Visit from 04/05/2021 in [facility]  Total GAD-7 Score 6 6 3 9       PHQ2-9    Flowsheet Row Office Visit from 03/28/2023 in [facility] [employer] Clinical Support from 03/17/2023 in [facility] [employer] from 12/20/2022 in [facility] Video Visit from 12/10/2022 in [facility] Video Visit from 11/27/2022 in [facility]  PHQ-2 Total Score 1 0 1  1 6  PHQ-9 Total Score 8 -- 8 -- 19      Flowsheet Row Video Visit from 06/24/2023 in [facility] Video Visit from 02/06/2023 in [facility] Counselor from 12/20/2022 in [facility]  C-SSRS RISK CATEGORY No Risk No Risk No Risk        Assessment and Plan: Kathryn Friedman is a 67 year old Caucasian female who has a history of MDD, anxiety disorder, bereavement, sleep problem was evaluated by telemedicine today.  Patient is currently stable with regards to mood symptoms, sleep has improved, patient however currently struggling with smoking cessation, failed trials of nicotine replacement, developed side effects to Chantix.  Discussed plan as noted below.  Plan MDD in full remission Cymbalta 60 mg p.o. daily Trazodone 150-200 mg p.o. daily Continue CBT with Ms.Katie Bounds.  Other specified anxiety disorder with limited symptom attack-stable Cymbalta 60 mg p.o. daily. Continue CBT  Insomnia-improving Requip 3 mg at bedtime and 0.5 mg in the afternoon Trazodone 150-200 mg p.o. nightly. Continue CPAP for OSA.  RLS-stable Requip as prescribed  Bereavement-stable Continue CBT  Tobacco use disorder-unstable Provided counseling  for 5 minutes. Increase Wellbutrin to Wellbutrin SR 100 mg p.o. twice daily Provided education, effect on sleep, mood, anxiety. Will also add nicotine patches-7 mg per 24 hours.  Patient may benefit from combination therapy and this was discussed with patient. I have also communicated with therapist-Ms. Bounds-patient may benefit from behavioral strategies for smoking cessation.  Follow-up in clinic in 6 to 7 weeks or sooner if needed.   Collaboration of Care: Collaboration of Care: Other I have communicated with Ms. Bounds-therapist as noted above.  Patient also encouraged to continue CBT.  Patient/Guardian was advised Release of Information must be obtained prior to any record release in order to collaborate their care with an outside provider. Patient/Guardian was advised if they have not already done so to contact the registration department to sign all necessary forms in order for [place] to release information regarding their care.   Consent: Patient/Guardian gives verbal consent for treatment and assignment of benefits for services provided during this visit. Patient/Guardian expressed understanding and agreed to proceed.   This note was generated in part or whole with voice recognition software. Voice recognition is usually quite accurate but there are transcription errors that can and very often do occur. I apologize for any typographical errors that were not detected and corrected.    Jomarie Longs, MD 06/24/2023, 9:26 AM

## 2023-06-25 ENCOUNTER — Other Ambulatory Visit: Payer: Self-pay

## 2023-06-25 ENCOUNTER — Telehealth: Payer: Self-pay

## 2023-06-25 NOTE — Telephone Encounter (Signed)
received a notice that a prior auth was needed on the nictine patches.

## 2023-06-25 NOTE — Telephone Encounter (Signed)
went online an started the prior auth - pending

## 2023-06-25 NOTE — Telephone Encounter (Signed)
Medication management - Prior authorization for patient to start Nicoderm patches submitted online with CoverMyMeds and sent to patient's [facility] [place] for review and approval.

## 2023-06-26 ENCOUNTER — Ambulatory Visit: Payer: Medicare Other | Admitting: Licensed Clinical Social Worker

## 2023-06-26 DIAGNOSIS — G4733 Obstructive sleep apnea (adult) (pediatric): Secondary | ICD-10-CM | POA: Diagnosis not present

## 2023-06-26 NOTE — Telephone Encounter (Signed)
prior auth for the patches was denied.

## 2023-06-26 NOTE — Telephone Encounter (Signed)
Noted  

## 2023-06-30 ENCOUNTER — Encounter: Payer: Self-pay | Admitting: Internal Medicine

## 2023-06-30 ENCOUNTER — Ambulatory Visit (INDEPENDENT_AMBULATORY_CARE_PROVIDER_SITE_OTHER): Payer: Medicare Other | Admitting: Internal Medicine

## 2023-06-30 ENCOUNTER — Telehealth: Payer: Self-pay | Admitting: Internal Medicine

## 2023-06-30 VITALS — BP 131/63 | HR 72 | Temp 97.8°F | Resp 16 | Ht 64.0 in | Wt 207.0 lb

## 2023-06-30 DIAGNOSIS — J449 Chronic obstructive pulmonary disease, unspecified: Secondary | ICD-10-CM | POA: Diagnosis not present

## 2023-06-30 DIAGNOSIS — G4733 Obstructive sleep apnea (adult) (pediatric): Secondary | ICD-10-CM

## 2023-06-30 DIAGNOSIS — F17210 Nicotine dependence, cigarettes, uncomplicated: Secondary | ICD-10-CM | POA: Diagnosis not present

## 2023-06-30 NOTE — Telephone Encounter (Signed)
Awaiting 06/30/23 office notes for SS order-Toni
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
## 2023-06-30 NOTE — Patient Instructions (Signed)

## 2023-07-03 ENCOUNTER — Ambulatory Visit: Payer: Medicare Other | Admitting: Dermatology

## 2023-07-04 ENCOUNTER — Ambulatory Visit: Payer: Medicare Other | Admitting: Licensed Clinical Social Worker

## 2023-07-04 ENCOUNTER — Telehealth: Payer: Medicare Other | Admitting: Internal Medicine

## 2023-07-04 DIAGNOSIS — G4733 Obstructive sleep apnea (adult) (pediatric): Secondary | ICD-10-CM

## 2023-07-04 DIAGNOSIS — F419 Anxiety disorder, unspecified: Secondary | ICD-10-CM

## 2023-07-04 DIAGNOSIS — Z634 Disappearance and death of family member: Secondary | ICD-10-CM

## 2023-07-04 DIAGNOSIS — Z79899 Other long term (current) drug therapy: Secondary | ICD-10-CM | POA: Diagnosis not present

## 2023-07-04 DIAGNOSIS — F330 Major depressive disorder, recurrent, mild: Secondary | ICD-10-CM

## 2023-07-04 DIAGNOSIS — F4310 Post-traumatic stress disorder, unspecified: Secondary | ICD-10-CM
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
## 2023-07-04 NOTE — Telephone Encounter (Signed)
Pt called in stating that she needs Dr. Lorin Picket to write a letter to [employer] stating that she uses her C-pap machine. Pt had a Virtual visit today 07/04/23 and cancel.unable to make something came up.

## 2023-07-06 ENCOUNTER — Encounter: Payer: Self-pay | Admitting: Internal Medicine

## 2023-07-06 NOTE — Progress Notes (Signed)
THERAPIST PROGRESS NOTE  Session Time: 9:01AM-9:20AM  Participation Level: Active  Behavioral Response: Casual and Well GroomedAlertDepressed  Type of Therapy: Individual Therapy  Treatment Goals addressed:  Reduce overall frequency, intensity and duration of anxiety so that daily functioning is not impaired per pt self report 3 out of 5 sessions.   Reduce overall frequency, intensity and duration of depression so that daily functioning is not impaired per pt self report 3 out of 5 sessions documented.   Recall traumatic events without becoming overwhelmed with negative emotions   ProgressTowards Goals: Progressing  Interventions: CBT, DBT, Motivational Interviewing, Strength-based, Reframing, and Other: ACT  Virtual Visit via Video Note  I connected with Kathryn Friedman on 07/04/2023 at  9:00 AM EDT by a video enabled telemedicine application and verified that I am speaking with the correct person using two identifiers.  Location: Patient: located in pt home Provider: working remotely in [place], [place]   I discussed the limitations of evaluation and management by telemedicine and the availability of in person appointments. The patient expressed understanding and agreed to proceed.  I discussed the assessment and treatment plan with the patient. The patient was provided an opportunity to ask questions and all were answered. The patient agreed with the plan and demonstrated an understanding of the instructions.   The patient was advised to call back or seek an in-person evaluation if the symptoms worsen or if the condition fails to improve as anticipated.  I provided 19 minutes of non-face-to-face time during this encounter.   Geoffry Paradise, LCSW  Summary: Kathryn Friedman is a 67 y.o. female who presents with mixed sxs of anxiety and depression related to hx of trauma including but not limited to lack of motivation, lack of interest fatigue, negative self affect, nervousness,  worry, and difficulty controlling worry.Patient oriented to person, place, and time. Pt denies SI/HI or A/V hallucinations. Pt was cooperative during visit and was engaged throughout the visit. Pt does not report any other concerns at the time of visit.  Patient requested a condensed session due to increased fatigue from weekly stressors. Patient identified stressors contributing to fatigue and identified ways in which she is managing her stress and response to her stress. Patient reported feeling increased worry due to seeing her pet not feeling well and also identified ways that she is experiencing hopefulness. Patient identified interpersonal conflict with a friend and named ways she is practicing problem solving with her friend.  Patient reported that she is looking forward to celebrating her birthday with her sister this weekend and shared that she and her sister are working to reclaim their relationship from their childhood. Patient reported desire to practice and work on trauma processing in upcoming sessions and reported feeling ready to engage in processing work. Patient reported no additional questions or concerns at this time.  LCSW provided mood monitoring and treatment progress review in the context of this episode of treatment. LCSW reviewed the pt's mood status since last session.   Pt is continuing to apply interventions/techniques learned in session into daily life situations. Pt is currently on track to meet goals utilizing interventions that are discussed in session. Treatment to continue as indicated. Personal growth and progress toward goals noted above.  Continued Recommendations as followed: Self-care behaviors, positive social engagements, focusing on positive physical and emotional wellness, and focusing on life/work balance.    Suicidal/Homicidal: Nowithout intent/plan  Therapist Response:  Provided pt education re: acceptance. Discussed how to make acceptance accessible  at  all parts of pt's healing journey.   LCSW practiced active listening to validate pt participation, build rapport, and create safe space for pt to feel heard as they are disclosing their thoughts and feelings.   Approached pt with strengths based perspective to assist pt in exploring strengths in moments of feeling low.   LCSW utilized therapeutic conversation skills informed by CBT, DBT, and ACT to expose pt to multiple ways of thinking about healing and to provide pt to access to multiple interventions.  Plan: Return again in 2 weeks.  Diagnosis: PTSD (post-traumatic stress disorder)  MDD (major depressive disorder), recurrent episode, mild (HCC)  Anxiety disorder, unspecified type  Bereavement    12/20/2022   10:19 AM 06/25/2021    3:25 PM 05/03/2021   10:33 AM 04/05/2021    1:47 PM  GAD 7 : Generalized Anxiety Score  Nervous, Anxious, on Edge 0 3 1 0  Control/stop worrying 1 1 0 3  Worry too much - different things 1 1 0 3  Trouble relaxing 1 0 1 2  Restless 2 0 1 0  Easily annoyed or irritable 1 1 0 1  Afraid - awful might happen 0 0 0 0  Total GAD 7 Score 6 6 3 9   Anxiety Difficulty Somewhat difficult Not difficult at all Somewhat difficult Somewhat difficult       03/28/2023   10:38 AM 03/17/2023   11:44 AM 12/20/2022   10:19 AM  Depression screen PHQ 2/9  Decreased Interest 0 0 1  Down, Depressed, Hopeless 1 0 0  PHQ - 2 Score 1 0 1  Altered sleeping 2  3  Tired, decreased energy 1  1  Change in appetite 2  1  Feeling bad or failure about yourself  2  1  Trouble concentrating 0  1  Moving slowly or fidgety/restless 0  0  Suicidal thoughts 0  0  PHQ-9 Score 8  8  Difficult doing work/chores Not difficult at all      Collaboration of Care: Psychiatrist AEB Dr. Elna Breslow  Patient/Guardian was advised Release of Information must be obtained prior to any record release in order to collaborate their care with an outside provider. Patient/Guardian was advised if they have  not already done so to contact the registration department to sign all necessary forms in order for [place] to release information regarding their care.   Consent: Patient/Guardian gives verbal consent for treatment and assignment of benefits for services provided during this visit. Patient/Guardian expressed understanding and agreed to proceed.   Geoffry Paradise, LCSW 07/06/2023

## 2023-07-06 NOTE — Progress Notes (Signed)
Patient ID: Kathryn Friedman, female   DOB: 08-11-1956, 67 y.o.   MRN: [id number] Had to cancel appt due to emergency.

## 2023-07-07 ENCOUNTER — Encounter: Payer: Self-pay | Admitting: Internal Medicine

## 2023-07-07 NOTE — Telephone Encounter (Signed)
Noted patient is also seeing pulmonary now. Will clarify with patient.

## 2023-07-09 ENCOUNTER — Telehealth: Payer: Self-pay | Admitting: Internal Medicine

## 2023-07-09 NOTE — Telephone Encounter (Signed)
Left vm and sent mychart message to confirm 07/16/23 appointment-Toni

## 2023-07-10 ENCOUNTER — Telehealth: Payer: Self-pay | Admitting: Internal Medicine

## 2023-07-10 ENCOUNTER — Other Ambulatory Visit (INDEPENDENT_AMBULATORY_CARE_PROVIDER_SITE_OTHER): Payer: Self-pay | Admitting: Vascular Surgery

## 2023-07-10 DIAGNOSIS — I872 Venous insufficiency (chronic) (peripheral): Secondary | ICD-10-CM

## 2023-07-10 DIAGNOSIS — I2510 Atherosclerotic heart disease of native coronary artery without angina pectoris: Secondary | ICD-10-CM

## 2023-07-10 DIAGNOSIS — I10 Essential (primary) hypertension: Secondary | ICD-10-CM

## 2023-07-10 DIAGNOSIS — I890 Lymphedema, not elsewhere classified: Secondary | ICD-10-CM

## 2023-07-10 DIAGNOSIS — I480 Paroxysmal atrial fibrillation: Secondary | ICD-10-CM

## 2023-07-10 NOTE — Telephone Encounter (Signed)
 SS order faxed to [facility]; [phone]

## 2023-07-11 ENCOUNTER — Telehealth: Payer: Self-pay

## 2023-07-11 NOTE — Telephone Encounter (Signed)
See more recent my chart message to patient.

## 2023-07-11 NOTE — Telephone Encounter (Signed)
See mychart.  

## 2023-07-11 NOTE — Telephone Encounter (Signed)
Patient states she is returning our call.  I do not see a message in the system so I let patient know I will enter a message so they can call her back.

## 2023-07-14 ENCOUNTER — Ambulatory Visit (INDEPENDENT_AMBULATORY_CARE_PROVIDER_SITE_OTHER): Payer: Medicare Other

## 2023-07-14 ENCOUNTER — Ambulatory Visit (INDEPENDENT_AMBULATORY_CARE_PROVIDER_SITE_OTHER): Payer: Medicare Other | Admitting: Vascular Surgery

## 2023-07-14 ENCOUNTER — Encounter (INDEPENDENT_AMBULATORY_CARE_PROVIDER_SITE_OTHER): Payer: Self-pay | Admitting: Vascular Surgery

## 2023-07-14 VITALS — BP 142/79 | HR 70 | Resp 16 | Wt 204.8 lb

## 2023-07-14 DIAGNOSIS — J449 Chronic obstructive pulmonary disease, unspecified: Secondary | ICD-10-CM

## 2023-07-14 DIAGNOSIS — I2510 Atherosclerotic heart disease of native coronary artery without angina pectoris: Secondary | ICD-10-CM

## 2023-07-14 DIAGNOSIS — I890 Lymphedema, not elsewhere classified: Secondary | ICD-10-CM | POA: Diagnosis not present

## 2023-07-14 DIAGNOSIS — I872 Venous insufficiency (chronic) (peripheral): Secondary | ICD-10-CM

## 2023-07-14 DIAGNOSIS — I10 Essential (primary) hypertension: Secondary | ICD-10-CM | POA: Diagnosis not present

## 2023-07-14 NOTE — Telephone Encounter (Signed)
Error

## 2023-07-14 NOTE — Progress Notes (Unsigned)
MRN : [id number]  Kathryn Friedman is a 67 y.o. (1956/06/14) female who presents with chief complaint of legs swell.  History of Present Illness:  The patient returns to the office for followup evaluation regarding leg swelling.  The swelling has persisted and the pain associated with swelling continues. There have not been any interval development of a ulcerations or wounds.  Since the previous visit the patient has been wearing graduated compression stockings and has noted little if any improvement in the lymphedema. The patient has been using compression routinely morning until night.  The patient also states elevation during the day and exercise is being done too.    Duplex ultrasound of the bilateral venous system demonstrates normal deep venous system bilaterally.  There is no superficial reflux noted on the right.  There is trivial superficial reflux noted on the left   Current Meds  Medication Sig   albuterol (VENTOLIN HFA) 108 (90 Base) MCG/ACT inhaler INHALE TWO PUFFS FOUR TIMES DAILY   amLODipine (NORVASC) 5 MG tablet Take 1 tablet (5 mg total) by mouth daily.   apixaban (ELIQUIS) 5 MG TABS tablet Take 1 tablet (5 mg total) by mouth 2 (two) times daily.   Budeson-Glycopyrrol-Formoterol (BREZTRI AEROSPHERE) 160-9-4.8 MCG/ACT AERO Inhale 2 puffs into the lungs in the morning and at bedtime.   buPROPion ER (WELLBUTRIN SR) 100 MG 12 hr tablet Take 1 tablet (100 mg total) by mouth 2 (two) times daily. Take 1 tablet daily at 8 AM and 1 tablet daily at 2 PM   CALCIUM PO Take 600 mg by mouth daily.    cholecalciferol (VITAMIN D3) 25 MCG (1000 UNIT) tablet Take 1,000 Units by mouth daily.   Continuous Blood Gluc Receiver DEVI Use as directed to check blood sugars daily   Continuous Glucose Sensor (DEXCOM G7 SENSOR) MISC Apply 1 sensor every 10 days.   Cyanocobalamin (VITAMIN B-12 PO) Take 2,500 mcg by mouth daily.   DULoxetine (CYMBALTA) 60 MG capsule Take  1 capsule (60 mg total) by mouth daily.   famotidine (PEPCID) 40 MG tablet Take 1 tablet (40 mg total) by mouth daily.   isosorbide mononitrate (IMDUR) 30 MG 24 hr tablet Take 1 tablet (30 mg total) by mouth 2 (two) times daily.   Multiple Vitamin (MULTIVITAMIN) tablet Take 1 tablet by mouth daily.   mupirocin ointment (BACTROBAN) 2 % Apply 1 Application topically 2 (two) times daily.   nicotine (NICODERM CQ - DOSED IN MG/24 HR) 7 mg/24hr patch Place 1 patch (7 mg total) onto the skin daily.   nitroGLYCERIN (NITROSTAT) 0.4 MG SL tablet Place 1 tablet (0.4 mg total) under the tongue every 5 (five) minutes as needed for chest pain.   nystatin (MYCOSTATIN/NYSTOP) powder APPLY TO THE AFFECTED AREA(S) TWICE DAILY   pantoprazole (PROTONIX) 40 MG tablet TAKE 1 TABLET TWICE DAILY BEFORE MEALS   propranolol ER (INDERAL LA) 80 MG 24 hr capsule Take 1 capsule (80 mg total) by mouth 2 (two) times daily.   rOPINIRole (REQUIP) 0.5 MG tablet Take 1 tablet (0.5 mg total) by mouth daily after lunch. Take along with 3 mg , total of 3.5 mg daily   rOPINIRole (REQUIP) 3 MG tablet Take 1 tablet (3 mg total) by mouth at bedtime. Take along with 0.5 mg daily, total of 3.5 mg daily.  rosuvastatin (CRESTOR) 20 MG tablet Take 1 tablet (20 mg total) by mouth daily.   Semaglutide, 1 MG/DOSE, 4 MG/3ML SOPN Inject 1 mg as directed once a week.   telmisartan (MICARDIS) 80 MG tablet Take 1 tablet (80 mg total) by mouth daily.   traZODone (DESYREL) 100 MG tablet Take 1.5-2 tablets (150-200 mg total) by mouth at bedtime.   triamcinolone cream (KENALOG) 0.1 % Apply 1 Application topically 2 (two) times daily.   trimethoprim (TRIMPEX) 100 MG tablet Take 1 tablet (100 mg total) by mouth daily.   Vibegron (GEMTESA) 75 MG TABS Take 1 tablet (75 mg total) by mouth daily.    Past Medical History:  Diagnosis Date   Anemia    Anginal pain (HCC)    Anxiety    Arthritis    back and knees   Asthma    Bilateral carotid artery  stenosis    Blood in stool    Chronic diarrhea    COPD (chronic obstructive pulmonary disease) (HCC)    Coronary artery disease    a.) 75% pRCA; 3.5 x 28 mm Cypher DES placed on 07/24/2006   Current use of long term anticoagulation    Clopidogrel   Depression    secondary to the death of her husband (died 59)   Diverticulitis    Diverticulosis    Dizzinesses    Dysphagia    Dyspnea    Dysrhythmia    Fatty infiltration of liver    GERD (gastroesophageal reflux disease)    Headache    History of 2019 novel coronavirus disease (COVID-19) 12/09/2020   History of 2019 novel coronavirus disease (COVID-19) 12/20/2020   Hypertension    Hypertriglyceridemia    ILD (interstitial lung disease) (HCC)    Lump in the abdomen    OSA on CPAP    Overactive bladder    PSVT (paroxysmal supraventricular tachycardia)    Spastic colon    T2DM (type 2 diabetes mellitus) (HCC) 05/2008   Tobacco abuse    Venous insufficiency of both lower extremities     Past Surgical History:  Procedure Laterality Date   ABDOMINAL HYSTERECTOMY  with left ovary in place 1996   APPENDECTOMY  1985   gallbladder and Appendix   BREAST BIOPSY Left 10/14/2017   calcs bx, fibrosis giant cell reaction and chronic inflammation, negative for malignancy.    CARPOMETACARPAL (CMC) FUSION OF THUMB Right 07/10/2022   Procedure: CARPOMETACARPAL (CMC) SUSPENSION OF RIGHT THUMB;  Surgeon: Christena Flake, MD;  Location: [facility] ORS;  Service: Orthopedics;  Laterality: Right;   CATARACT EXTRACTION, BILATERAL     CESAREAN SECTION  1984   CHOLECYSTECTOMY  1985   COLONOSCOPY WITH PROPOFOL N/A 09/13/2016   Procedure: COLONOSCOPY WITH PROPOFOL;  Surgeon: Scot Jun, MD;  Location: [facility] ENDOSCOPY;  Service: Endoscopy;  Laterality: N/A;   COLONOSCOPY WITH PROPOFOL N/A 11/09/2018   Procedure: COLONOSCOPY WITH PROPOFOL;  Surgeon: Scot Jun, MD;  Location: [facility] ENDOSCOPY;  Service: Endoscopy;  Laterality: N/A;   COLONOSCOPY  WITH PROPOFOL N/A 03/29/2020   Procedure: COLONOSCOPY WITH PROPOFOL;  Surgeon: Earline Mayotte, MD;  Location: [facility] ENDOSCOPY;  Service: Endoscopy;  Laterality: N/A;   CORONARY ANGIOPLASTY WITH STENT PLACEMENT N/A 07/24/2006   75% pRCA; 3.5 x 28 mm Cypher DES placed; Location: [facility]; Surgeons: Rudean Hitt, MD   ESOPHAGOGASTRODUODENOSCOPY (EGD) WITH PROPOFOL N/A 02/02/2018   Procedure: ESOPHAGOGASTRODUODENOSCOPY (EGD) WITH PROPOFOL;  Surgeon: Scot Jun, MD;  Location: [facility] ENDOSCOPY;  Service: Endoscopy;  Laterality:  N/A;   ESOPHAGOGASTRODUODENOSCOPY (EGD) WITH PROPOFOL N/A 03/29/2020   Procedure: ESOPHAGOGASTRODUODENOSCOPY (EGD) WITH PROPOFOL;  Surgeon: Earline Mayotte, MD;  Location: [facility] ENDOSCOPY;  Service: Endoscopy;  Laterality: N/A;   EYE SURGERY     JOINT REPLACEMENT     bilateral knee replacements   KNEE ARTHROSCOPY  Arthroscopic left knee surgery    KNEE SURGERY  status post knee surgey    LEFT HEART CATH AND CORONARY ANGIOGRAPHY Left 05/14/2018   Procedure: LEFT HEART CATH AND CORONARY ANGIOGRAPHY;  Surgeon: Lamar Blinks, MD;  Location: [facility] INVASIVE CV LAB;  Service: Cardiovascular;  Laterality: Left;   REPLACEMENT TOTAL KNEE  ([facility])   SHOULDER SURGERY  shoulder operation secondary to a torn tendon   TOTAL HIP ARTHROPLASTY Left 06/12/2021   Procedure: TOTAL HIP ARTHROPLASTY;  Surgeon: Christena Flake, MD;  Location: [facility] ORS;  Service: Orthopedics;  Laterality: Left;    Social History Social History   Tobacco Use   Smoking status: Every Day    Current packs/day: 2.00    Average packs/day: 2.0 packs/day for 45.0 years (90.0 ttl pk-yrs)    Types: Cigarettes    Passive exposure: Current   Smokeless tobacco: Never   Tobacco comments:    Smokes 18 cigarettes every day 12/24/22  Vaping Use   Vaping status: Former   Substances: Flavoring  Substance Use Topics   Alcohol use: No    Alcohol/week: 0.0 standard drinks of alcohol   Drug use: No    Family  History Family History  Problem Relation Age of Onset   Other Mother        Hit by a fire truck and has had multiple operations on her back , and has history of MVP    Mitral valve prolapse Mother    Lung cancer Mother    Depression Mother    Heart disease Father        myocardial infarction and is status post bypass surgery   Mitral valve prolapse Sister    Bipolar disorder Sister    Hepatitis C Brother    Cirrhosis Brother    Colon cancer Paternal Aunt    Breast cancer Neg Hx    Prostate cancer Neg Hx    Bladder Cancer Neg Hx    Kidney cancer Neg Hx     Allergies  Allergen Reactions   Varenicline Other (See Comments)    "I got really depressed" "I got really depressed" CHANTEX   Varenicline Tartrate    Jardiance [Empagliflozin] Other (See Comments)    Yeast infection   Metformin And Related Other (See Comments)    Diarrhea, even with XR   Methylprednisolone Nausea And Vomiting and Nausea Only    As per pt she is not allergic      REVIEW OF SYSTEMS (Negative unless checked)  Constitutional: [] Weight loss  [] Fever  [] Chills Cardiac: [] Chest pain   [] Chest pressure   [] Palpitations   [] Shortness of breath when laying flat   [x] Shortness of breath with exertion. Vascular:  [] Pain in legs with walking   [x] Pain in legs with standing  [] History of DVT   [] Phlebitis   [x] Swelling in legs   [] Varicose veins   [] Non-healing ulcers Pulmonary:   [] Uses home oxygen   [] Productive cough   [] Hemoptysis   [] Wheeze  [x] COPD   [] Asthma Neurologic:  [] Dizziness   [] Seizures   [] History of stroke   [] History of TIA  [] Aphasia   [] Vissual changes   [] Weakness or numbness in arm   []   Weakness or numbness in leg Musculoskeletal:   [] Joint swelling   [x] Joint pain   [] Low back pain Hematologic:  [] Easy bruising  [] Easy bleeding   [] Hypercoagulable state   [] Anemic Gastrointestinal:  [] Diarrhea   [] Vomiting  [x] Gastroesophageal reflux/heartburn   [] Difficulty swallowing. Genitourinary:   [] Chronic kidney disease   [] Difficult urination  [] Frequent urination   [] Blood in urine Skin:  [] Rashes   [] Ulcers  Psychological:  [x] History of anxiety   [x]  History of major depression.  Physical Examination  Vitals:   07/14/23 1035  BP: (!) 142/79  Pulse: 70  Resp: 16  Weight: 204 lb 12.8 oz (92.9 kg)   Body mass index is 35.15 kg/m. Gen: WD/WN, NAD Head: [place]/AT, No temporalis wasting.  Ear/Nose/Throat: Hearing grossly intact, nares w/o erythema or drainage, pinna without lesions Eyes: PER, EOMI, sclera nonicteric.  Neck: Supple, no gross masses.  No JVD.  Pulmonary:  Good air movement, no audible wheezing, no use of accessory muscles.  Cardiac: RRR, precordium not hyperdynamic. Vascular:  scattered varicosities present bilaterally.  Mild venous stasis changes to the legs bilaterally.  3-4+ soft pitting edema, CEAP C4sEpAsPr  Vessel Right Left  Radial Palpable Palpable  Gastrointestinal: soft, non-distended. No guarding/no peritoneal signs.  Musculoskeletal: M/S 5/5 throughout.  No deformity.  Neurologic: CN 2-12 intact. Pain and light touch intact in extremities.  Symmetrical.  Speech is fluent. Motor exam as listed above. Psychiatric: Judgment intact, Mood & affect appropriate for pt's clinical situation. Dermatologic: Venous rashes no ulcers noted.  No changes consistent with cellulitis. Lymph : No lichenification or skin changes of chronic lymphedema.  CBC Lab Results  Component Value Date   WBC 7.1 12/20/2022   HGB 14.6 12/20/2022   HCT 42.7 12/20/2022   MCV 99.1 12/20/2022   PLT 144.0 (L) 12/20/2022    BMET    Component Value Date/Time   NA 136 05/14/2023 1049   NA 140 08/08/2014 0547   K 4.4 05/14/2023 1049   K 3.7 08/08/2014 0547   CL 102 05/14/2023 1049   CL 107 08/08/2014 0547   CO2 28 05/14/2023 1049   CO2 25 08/08/2014 0547   GLUCOSE 130 (H) 05/14/2023 1049   GLUCOSE 127 (H) 08/08/2014 0547   BUN 12 05/14/2023 1049   BUN 5 (L) 08/08/2014 0547    CREATININE 0.84 05/14/2023 1049   CREATININE 0.75 08/08/2014 0547   CALCIUM 8.9 05/14/2023 1049   CALCIUM 8.0 (L) 08/08/2014 0547   GFRNONAA >60 06/20/2021 0601   GFRNONAA >60 08/08/2014 0547   GFRAA >60 12/24/2018 1214   GFRAA >60 08/08/2014 0547   CrCl cannot be calculated (Patient's most recent lab result is older than the maximum 21 days allowed.).  COAG No results found for: "INR", "PROTIME"  Radiology No results found.   Assessment/Plan 1. Lymphedema Recommend:  No surgery or intervention at this point in time.   The Patient is CEAP C4sEpAsPr.  The patient has been wearing compression for more than 12 weeks with no or little benefit.  The patient has been exercising daily for more than 12 weeks. The patient has been elevating and taking OTC pain medications for more than 12 weeks.  None of these have have eliminated the pain related to the lymphedema or the discomfort regarding excessive swelling and venous congestion.    I have reviewed my discussion with the patient regarding lymphedema and why it  causes symptoms.  Patient will continue wearing graduated compression on a daily basis. The patient should put the  compression on first thing in the morning and removing them in the evening. The patient should not sleep in the compression.   In addition, behavioral modification throughout the day will be continued.  This will include frequent elevation (such as in a recliner), use of over the counter pain medications as needed and exercise such as walking.  The systemic causes for chronic edema such as liver, kidney and cardiac etiologies do not appear to have significant changed over the past year.    The patient has chronic , severe lymphedema with hyperpigmentation of the skin and has done MLD, skin care, medication, diet, exercise, elevation and compression for 4 weeks with no improvement,  I am recommending a lymphedema pump.  The patient still has stage 3 lymphedema and  therefore, I believe that a lymph pump is needed to improve the control of the patient's lymphedema and improve the quality of life.  Additionally, a lymph pump is warranted because it will reduce the risk of cellulitis and ulceration in the future.  Patient should follow-up in six months   2. Venous insufficiency of both lower extremities Recommend:  No surgery or intervention at this point in time.   The Patient is CEAP C4sEpAsPr.  The patient has been wearing compression for more than 12 weeks with no or little benefit.  The patient has been exercising daily for more than 12 weeks. The patient has been elevating and taking OTC pain medications for more than 12 weeks.  None of these have have eliminated the pain related to the lymphedema or the discomfort regarding excessive swelling and venous congestion.    I have reviewed my discussion with the patient regarding lymphedema and why it  causes symptoms.  Patient will continue wearing graduated compression on a daily basis. The patient should put the compression on first thing in the morning and removing them in the evening. The patient should not sleep in the compression.   In addition, behavioral modification throughout the day will be continued.  This will include frequent elevation (such as in a recliner), use of over the counter pain medications as needed and exercise such as walking.  The systemic causes for chronic edema such as liver, kidney and cardiac etiologies do not appear to have significant changed over the past year.    The patient has chronic , severe lymphedema with hyperpigmentation of the skin and has done MLD, skin care, medication, diet, exercise, elevation and compression for 4 weeks with no improvement,  I am recommending a lymphedema pump.  The patient still has stage 3 lymphedema and therefore, I believe that a lymph pump is needed to improve the control of the patient's lymphedema and improve the quality of life.   Additionally, a lymph pump is warranted because it will reduce the risk of cellulitis and ulceration in the future.  Patient should follow-up in six months   3. Coronary artery disease involving native coronary artery of native heart without angina pectoris Continue cardiac and antihypertensive medications as already ordered and reviewed, no changes at this time.  Continue statin as ordered and reviewed, no changes at this time  Nitrates PRN for chest pain  4. Essential hypertension, benign Continue antihypertensive medications as already ordered, these medications have been reviewed and there are no changes at this time.  5. Chronic obstructive pulmonary disease, unspecified COPD type (HCC) Continue pulmonary medications and aerosols as already ordered, these medications have been reviewed and there are no changes at this time.  Levora Dredge, MD  07/14/2023 10:46 AM

## 2023-07-15 ENCOUNTER — Encounter (INDEPENDENT_AMBULATORY_CARE_PROVIDER_SITE_OTHER): Payer: Self-pay | Admitting: Vascular Surgery

## 2023-07-16 ENCOUNTER — Ambulatory Visit (INDEPENDENT_AMBULATORY_CARE_PROVIDER_SITE_OTHER): Payer: Medicare Other | Admitting: Internal Medicine

## 2023-07-16 DIAGNOSIS — J449 Chronic obstructive pulmonary disease, unspecified: Secondary | ICD-10-CM

## 2023-07-17 ENCOUNTER — Ambulatory Visit: Payer: Medicare Other | Admitting: Dermatology

## 2023-07-17 ENCOUNTER — Other Ambulatory Visit: Payer: Self-pay | Admitting: Psychiatry

## 2023-07-17 DIAGNOSIS — G4701 Insomnia due to medical condition: Secondary | ICD-10-CM

## 2023-07-17 DIAGNOSIS — F331 Major depressive disorder, recurrent, moderate: Secondary | ICD-10-CM

## 2023-07-17 DIAGNOSIS — G2581 Restless legs syndrome: Secondary | ICD-10-CM

## 2023-07-18 ENCOUNTER — Ambulatory Visit (INDEPENDENT_AMBULATORY_CARE_PROVIDER_SITE_OTHER): Payer: Medicare Other | Admitting: Licensed Clinical Social Worker

## 2023-07-18 DIAGNOSIS — F419 Anxiety disorder, unspecified: Secondary | ICD-10-CM | POA: Diagnosis not present

## 2023-07-18 DIAGNOSIS — F330 Major depressive disorder, recurrent, mild: Secondary | ICD-10-CM

## 2023-07-18 DIAGNOSIS — F4310 Post-traumatic stress disorder, unspecified: Secondary | ICD-10-CM

## 2023-07-22 DIAGNOSIS — G4733 Obstructive sleep apnea (adult) (pediatric): Secondary | ICD-10-CM | POA: Diagnosis not present

## 2023-07-23 DIAGNOSIS — Z79899 Other long term (current) drug therapy: Secondary | ICD-10-CM | POA: Diagnosis not present

## 2023-07-25 ENCOUNTER — Telehealth: Payer: Self-pay | Admitting: Pharmacy Technician

## 2023-07-25 ENCOUNTER — Encounter: Payer: Self-pay | Admitting: Internal Medicine

## 2023-07-25 DIAGNOSIS — Z5986 Financial insecurity: Secondary | ICD-10-CM

## 2023-07-25 NOTE — Telephone Encounter (Signed)
Noted  

## 2023-07-25 NOTE — Progress Notes (Signed)
[facility])                                            [facility] Quality Pharmacy Team    07/25/2023  Kathryn Friedman 09-Jul-1956 [id number]  Received message from Rita Ohara, LPN inquiring about the status of patient's supply of Ozempic from [employer] patient assistance foundation.  Care coordination call placed to [employer] and they informed the order began processing on 07/14/23 and should be delivered to provider's office in next 10-14 business days no later than 08/04/23.  Unsuccessful outreach to patient. HIPAA compliant v/m left requesting patient to return call.  Pattricia Boss, CPhT [place]  [facility] Office: [phone] Fax: [phone] Email: [email or web address]

## 2023-07-25 NOTE — Telephone Encounter (Signed)
Pt called in to let [place] LPN knows that she has Ozempic left until 08/04/23.

## 2023-07-27 DIAGNOSIS — G4733 Obstructive sleep apnea (adult) (pediatric): Secondary | ICD-10-CM | POA: Diagnosis not present

## 2023-07-31 ENCOUNTER — Ambulatory Visit: Payer: Medicare Other | Admitting: Internal Medicine

## 2023-08-01 ENCOUNTER — Telehealth: Payer: Self-pay | Admitting: Internal Medicine

## 2023-08-01 NOTE — Telephone Encounter (Signed)
SS appointment 08/14/2023 @ [facility]

## 2023-08-04 ENCOUNTER — Telehealth (INDEPENDENT_AMBULATORY_CARE_PROVIDER_SITE_OTHER): Payer: Medicare Other | Admitting: Psychiatry

## 2023-08-04 ENCOUNTER — Encounter: Payer: Self-pay | Admitting: Psychiatry

## 2023-08-04 DIAGNOSIS — G4701 Insomnia due to medical condition: Secondary | ICD-10-CM

## 2023-08-04 DIAGNOSIS — Z634 Disappearance and death of family member: Secondary | ICD-10-CM

## 2023-08-04 DIAGNOSIS — F418 Other specified anxiety disorders: Secondary | ICD-10-CM

## 2023-08-04 DIAGNOSIS — F17200 Nicotine dependence, unspecified, uncomplicated: Secondary | ICD-10-CM

## 2023-08-04 DIAGNOSIS — F3342 Major depressive disorder, recurrent, in full remission: Secondary | ICD-10-CM | POA: Diagnosis not present

## 2023-08-04 DIAGNOSIS — G2581 Restless legs syndrome: Secondary | ICD-10-CM

## 2023-08-04 DIAGNOSIS — F17210 Nicotine dependence, cigarettes, uncomplicated: Secondary | ICD-10-CM

## 2023-08-04 MED ORDER — BUPROPION HCL ER (SR) 150 MG PO TB12: 150.0000 mg | ORAL_TABLET | Freq: Two times a day (BID) | ORAL | 0 refills | Status: DC

## 2023-08-04 NOTE — Progress Notes (Signed)
THERAPIST PROGRESS NOTE  Session Time: 10:01AM-10:50AM  Participation Level: Active  Behavioral Response: Casual and Well GroomedAlertDepressed  Type of Therapy: Individual Therapy  Treatment Goals addressed:  Reduce overall frequency, intensity and duration of anxiety so that daily functioning is not impaired per pt self report 3 out of 5 sessions.   Reduce overall frequency, intensity and duration of depression so that daily functioning is not impaired per pt self report 3 out of 5 sessions documented.   Recall traumatic events without becoming overwhelmed with negative emotions  ProgressTowards Goals: Progressing  Interventions: CBT, DBT, Motivational Interviewing, Strength-based, and Other: ACT  Virtual Visit via Video Note  I connected with Kathryn Friedman on 04/17/2023 at 10:00 AM EDT by a video enabled telemedicine application and verified that I am speaking with the correct person using two identifiers.  Location: Patient: located in pt home Provider: working remotely in [place], [place]   I discussed the limitations of evaluation and management by telemedicine and the availability of in person appointments. The patient expressed understanding and agreed to proceed.  I discussed the assessment and treatment plan with the patient. The patient was provided an opportunity to ask questions and all were answered. The patient agreed with the plan and demonstrated an understanding of the instructions.   The patient was advised to call back or seek an in-person evaluation if the symptoms worsen or if the condition fails to improve as anticipated.  I provided 49 minutes of non-face-to-face time during this encounter.   Geoffry Paradise, LCSW  Summary: Kathryn Friedman is a 67 y.o. female who presents with mixed sxs of anxiety and depression re: a hx of trauma, bereavement and experiences with current stressors. Pt oriented to person, place, and time. Pt denies SI/HI or A/V  hallucinations. Pt was cooperative during visit and was engaged throughout the visit. Pt does not report any other concerns at the time of visit.  Pt reported need for condensed appt due to loaded plate today.   Revisited this pt from previous appt: Pt reported desire to grow socially. Discussed checking emails at a coffee shop or over breakfast vs at home. Discussed small ways to socially reintegrate into the world. Pt reported being afraid of what people will see when they see her. Discussed ways to pour into self physically. Pt shared ways she helps her dog dress up. Invited pt to play dress up and have a makeover with her dog each day. Pt reported that she was never taught how to be a lady. LCSW identified how pt teaches her dog how to be a lady and suggested learning with her dog.  Invited pt to offer self a praise with every criticism.   LCSW provided mood monitoring and treatment progress review in the context of this episode of treatment. LCSW reviewed the pt's mood status since last session.   Pt is continuing to apply interventions/techniques learned in session into daily life situations. Pt is currently on track to meet goals utilizing interventions that are discussed in session. Treatment to continue as indicated. Personal growth and progress toward goals noted above.  Continued Recommendations as followed: Self-care behaviors, positive social engagements, focusing on positive physical and emotional wellness, and focusing on life/work balance.     Suicidal/Homicidal: Nowithout intent/plan  Therapist Response:  Provided pt education re: acceptance. Discussed how to make acceptance accessible at all parts of pt's healing journey.   LCSW practiced active listening to validate pt participation, build rapport, and create  safe space for pt to feel heard as they are disclosing their thoughts and feelings.   Approached pt with strengths based perspective to assist pt in exploring strengths  in moments of feeling low.   LCSW utilized therapeutic conversation skills informed by CBT, DBT, and ACT to expose pt to multiple ways of thinking about healing and to provide pt to access to multiple interventions.  Plan: Return again in 2 weeks.  Diagnosis: PTSD (post-traumatic stress disorder)  MDD (major depressive disorder), recurrent episode, moderate (HCC)  Anxiety disorder, unspecified type    12/20/2022   10:19 AM 06/25/2021    3:25 PM 05/03/2021   10:33 AM 04/05/2021    1:47 PM  GAD 7 : Generalized Anxiety Score  Nervous, Anxious, on Edge 0 3 1 0  Control/stop worrying 1 1 0 3  Worry too much - different things 1 1 0 3  Trouble relaxing 1 0 1 2  Restless 2 0 1 0  Easily annoyed or irritable 1 1 0 1  Afraid - awful might happen 0 0 0 0  Total GAD 7 Score 6 6 3 9   Anxiety Difficulty Somewhat difficult Not difficult at all Somewhat difficult Somewhat difficult       03/28/2023   10:38 AM 03/17/2023   11:44 AM 12/20/2022   10:19 AM  Depression screen PHQ 2/9  Decreased Interest 0 0 1  Down, Depressed, Hopeless 1 0 0  PHQ - 2 Score 1 0 1  Altered sleeping 2  3  Tired, decreased energy 1  1  Change in appetite 2  1  Feeling bad or failure about yourself  2  1  Trouble concentrating 0  1  Moving slowly or fidgety/restless 0  0  Suicidal thoughts 0  0  PHQ-9 Score 8  8  Difficult doing work/chores Not difficult at all      Collaboration of Care: Psychiatrist AEB Dr. Elna Breslow  Patient/Guardian was advised Release of Information must be obtained prior to any record release in order to collaborate their care with an outside provider. Patient/Guardian was advised if they have not already done so to contact the registration department to sign all necessary forms in order for [place] to release information regarding their care.   Consent: Patient/Guardian gives verbal consent for treatment and assignment of benefits for services provided during this visit. Patient/Guardian expressed  understanding and agreed to proceed.   Geoffry Paradise, LCSW 08/04/2023

## 2023-08-04 NOTE — Progress Notes (Signed)
THERAPIST PROGRESS NOTE  Session Time: 10:01AM-10:17AM  Participation Level: Active  Behavioral Response: Casual and Well GroomedAlertDepressed  Type of Therapy: Individual Therapy  Treatment Goals addressed:  Reduce overall frequency, intensity and duration of anxiety so that daily functioning is not impaired per pt self report 3 out of 5 sessions.   Reduce overall frequency, intensity and duration of depression so that daily functioning is not impaired per pt self report 3 out of 5 sessions documented.   Recall traumatic events without becoming overwhelmed with negative emotions  ProgressTowards Goals: Progressing  Interventions: CBT, DBT, Motivational Interviewing, Strength-based, and Other: ACT  Virtual Visit via Video Note  I connected with Kathryn Friedman on 04/03/2023 at 10:00 AM EDT by a video enabled telemedicine application and verified that I am speaking with the correct person using two identifiers.  Location: Patient: located in pt home Provider: working remotely in [place], [place]   I discussed the limitations of evaluation and management by telemedicine and the availability of in person appointments. The patient expressed understanding and agreed to proceed.  I discussed the assessment and treatment plan with the patient. The patient was provided an opportunity to ask questions and all were answered. The patient agreed with the plan and demonstrated an understanding of the instructions.   The patient was advised to call back or seek an in-person evaluation if the symptoms worsen or if the condition fails to improve as anticipated.  I provided 16 minutes of non-face-to-face time during this encounter.   Geoffry Paradise, LCSW  Summary: Kathryn Friedman is a 67 y.o. female who presents with mixed sxs of anxiety and depression re: a hx of trauma, bereavement and experiences with current stressors. Pt oriented to person, place, and time. Pt denies SI/HI or A/V  hallucinations. Pt was cooperative during visit and was engaged throughout the visit. Pt does not report any other concerns at the time of visit.  Pt reported need for condensed appt due to loaded plate today.   Revisited this pt from previous appt: Pt reported desire to grow socially. Discussed checking emails at a coffee shop or over breakfast vs at home. Discussed small ways to socially reintegrate into the world. Pt reported being afraid of what people will see when they see her. Discussed ways to pour into self physically. Pt shared ways she helps her dog dress up. Invited pt to play dress up and have a makeover with her dog each day. Pt reported that she was never taught how to be a lady. LCSW identified how pt teaches her dog how to be a lady and suggested learning with her dog.  Invited pt to offer self a praise with every criticism.   LCSW provided mood monitoring and treatment progress review in the context of this episode of treatment. LCSW reviewed the pt's mood status since last session.   Pt is continuing to apply interventions/techniques learned in session into daily life situations. Pt is currently on track to meet goals utilizing interventions that are discussed in session. Treatment to continue as indicated. Personal growth and progress toward goals noted above.  Continued Recommendations as followed: Self-care behaviors, positive social engagements, focusing on positive physical and emotional wellness, and focusing on life/work balance.     Suicidal/Homicidal: Nowithout intent/plan  Therapist Response:  Provided pt education re: acceptance. Discussed how to make acceptance accessible at all parts of pt's healing journey.   LCSW practiced active listening to validate pt participation, build rapport, and create  safe space for pt to feel heard as they are disclosing their thoughts and feelings.   Approached pt with strengths based perspective to assist pt in exploring strengths  in moments of feeling low.   LCSW utilized therapeutic conversation skills informed by CBT, DBT, and ACT to expose pt to multiple ways of thinking about healing and to provide pt to access to multiple interventions.  Plan: Return again in 2 weeks.  Diagnosis: MDD (major depressive disorder), recurrent episode, moderate (HCC)  PTSD (post-traumatic stress disorder)    12/20/2022   10:19 AM 06/25/2021    3:25 PM 05/03/2021   10:33 AM 04/05/2021    1:47 PM  GAD 7 : Generalized Anxiety Score  Nervous, Anxious, on Edge 0 3 1 0  Control/stop worrying 1 1 0 3  Worry too much - different things 1 1 0 3  Trouble relaxing 1 0 1 2  Restless 2 0 1 0  Easily annoyed or irritable 1 1 0 1  Afraid - awful might happen 0 0 0 0  Total GAD 7 Score 6 6 3 9   Anxiety Difficulty Somewhat difficult Not difficult at all Somewhat difficult Somewhat difficult       03/28/2023   10:38 AM 03/17/2023   11:44 AM 12/20/2022   10:19 AM  Depression screen PHQ 2/9  Decreased Interest 0 0 1  Down, Depressed, Hopeless 1 0 0  PHQ - 2 Score 1 0 1  Altered sleeping 2  3  Tired, decreased energy 1  1  Change in appetite 2  1  Feeling bad or failure about yourself  2  1  Trouble concentrating 0  1  Moving slowly or fidgety/restless 0  0  Suicidal thoughts 0  0  PHQ-9 Score 8  8  Difficult doing work/chores Not difficult at all      Collaboration of Care: Psychiatrist AEB Dr. Elna Breslow  Patient/Guardian was advised Release of Information must be obtained prior to any record release in order to collaborate their care with an outside provider. Patient/Guardian was advised if they have not already done so to contact the registration department to sign all necessary forms in order for [place] to release information regarding their care.   Consent: Patient/Guardian gives verbal consent for treatment and assignment of benefits for services provided during this visit. Patient/Guardian expressed understanding and agreed to proceed.    Geoffry Paradise, LCSW 08/04/2023

## 2023-08-04 NOTE — Progress Notes (Unsigned)
Virtual Visit via Video Note  I connected with Kathryn Friedman on 08/04/23 at  3:00 PM EDT by a video enabled telemedicine application and verified that I am speaking with the correct person using two identifiers.  Location Provider Location : [facility] Patient Location : Home  Participants: Patient , Provider    I discussed the limitations of evaluation and management by telemedicine and the availability of in person appointments. The patient expressed understanding and agreed to proceed.    I discussed the assessment and treatment plan with the patient. The patient was provided an opportunity to ask questions and all were answered. The patient agreed with the plan and demonstrated an understanding of the instructions.   The patient was advised to call back or seek an in-person evaluation if the symptoms worsen or if the condition fails to improve as anticipated.   BH MD OP Progress Note  08/04/2023 3:19 PM KAYA CAJAS  MRN:  [id number]  Chief Complaint:  Chief Complaint  Patient presents with   Follow-up   Depression   Anxiety   Medication Refill   HPI: Kathryn Friedman is a 67 year old Caucasian female, widowed, retired, on [employer], lives in [place], has a history of MDD, insomnia, obstructive sleep apnea on CPAP, history of coronary artery disease, status post stent placement, COPD, diabetes mellitus, hypertension, GERD, interstitial lung disease, iron deficiency was evaluated by telemedicine today.  Patient today reports she continues to be doing well with regards to her mood.  Denies any significant sadness, low motivation, concentration problems or anhedonia.  Reports anxiety symptoms are manageable.  She is coping with her grief okay.  She continues to make use of her support system.  She continues to spend time with her son.  She also has friends that she spends time with.  She does a lot of service online and that keeps her busy.  Patient reports she continues to struggle  with smoking.  The higher dosage of Wellbutrin did not help.  She is interested in a dosage increase.  She is tolerating the Wellbutrin well.  Patient reports she also attended smoking cessation classes which did not work for her.  Patient reports sleep as good.  Patient continues to follow-up with her therapist Ms. Allayne Butcher, reports therapy sessions are beneficial.  Patient denies any suicidality, homicidality or perceptual disturbances.  Patient denies any other concerns today.  Visit Diagnosis:    ICD-10-CM   1. MDD (major depressive disorder), recurrent, in full remission (HCC)  F33.42     2. Insomnia due to medical condition  G47.01    OSA, RLS    3. RLS (restless legs syndrome)  G25.81     4. Other specified anxiety disorders  F41.8    Limited symptom attacks    5. Bereavement  Z63.4     6. Tobacco use disorder  F17.200 buPROPion (WELLBUTRIN SR) 150 MG 12 hr tablet      Past Psychiatric History: I have past psychiatric history from progress note on 06/26/2020.  Past trials of trazodone, Cymbalta, Lexapro, Rexulti, doxepin, gabapentin, Lunesta.  Patient was previously under the care of [employer].  Past Medical History:  Past Medical History:  Diagnosis Date   Anemia    Anginal pain (HCC)    Anxiety    Arthritis    back and knees   Asthma    Bilateral carotid artery stenosis    Blood in stool    Chronic diarrhea    COPD (chronic  obstructive pulmonary disease) (HCC)    Coronary artery disease    a.) 75% pRCA; 3.5 x 28 mm Cypher DES placed on 07/24/2006   Current use of long term anticoagulation    Clopidogrel   Depression    secondary to the death of her husband (died 08-11-95)   Diverticulitis    Diverticulosis    Dizzinesses    Dysphagia    Dyspnea    Dysrhythmia    Fatty infiltration of liver    GERD (gastroesophageal reflux disease)    Headache    History of 08-10-2018 novel coronavirus disease (COVID-19) 12/09/2020   History of 08-10-18 novel  coronavirus disease (COVID-19) 12/20/2020   Hypertension    Hypertriglyceridemia    ILD (interstitial lung disease) (HCC)    Lump in the abdomen    OSA on CPAP    Overactive bladder    PSVT (paroxysmal supraventricular tachycardia)    Spastic colon    T2DM (type 2 diabetes mellitus) (HCC) 05/2008   Tobacco abuse    Venous insufficiency of both lower extremities     Past Surgical History:  Procedure Laterality Date   ABDOMINAL HYSTERECTOMY  with left ovary in place 11-Aug-1995   APPENDECTOMY  1985   gallbladder and Appendix   BREAST BIOPSY Left 10/14/2017   calcs bx, fibrosis giant cell reaction and chronic inflammation, negative for malignancy.    CARPOMETACARPAL (CMC) FUSION OF THUMB Right 07/10/2022   Procedure: CARPOMETACARPAL (CMC) SUSPENSION OF RIGHT THUMB;  Surgeon: Christena Flake, MD;  Location: [facility] ORS;  Service: Orthopedics;  Laterality: Right;   CATARACT EXTRACTION, BILATERAL     CESAREAN SECTION  1984   CHOLECYSTECTOMY  1985   COLONOSCOPY WITH PROPOFOL N/A 09/13/2016   Procedure: COLONOSCOPY WITH PROPOFOL;  Surgeon: Scot Jun, MD;  Location: [facility] ENDOSCOPY;  Service: Endoscopy;  Laterality: N/A;   COLONOSCOPY WITH PROPOFOL N/A 11/09/2018   Procedure: COLONOSCOPY WITH PROPOFOL;  Surgeon: Scot Jun, MD;  Location: [facility] ENDOSCOPY;  Service: Endoscopy;  Laterality: N/A;   COLONOSCOPY WITH PROPOFOL N/A 03/29/2020   Procedure: COLONOSCOPY WITH PROPOFOL;  Surgeon: Earline Mayotte, MD;  Location: [facility] ENDOSCOPY;  Service: Endoscopy;  Laterality: N/A;   CORONARY ANGIOPLASTY WITH STENT PLACEMENT N/A 07/24/2006   75% pRCA; 3.5 x 28 mm Cypher DES placed; Location: [facility]; Surgeons: Rudean Hitt, MD   ESOPHAGOGASTRODUODENOSCOPY (EGD) WITH PROPOFOL N/A 02/02/2018   Procedure: ESOPHAGOGASTRODUODENOSCOPY (EGD) WITH PROPOFOL;  Surgeon: Scot Jun, MD;  Location: [facility] ENDOSCOPY;  Service: Endoscopy;  Laterality: N/A;   ESOPHAGOGASTRODUODENOSCOPY (EGD) WITH PROPOFOL N/A  03/29/2020   Procedure: ESOPHAGOGASTRODUODENOSCOPY (EGD) WITH PROPOFOL;  Surgeon: Earline Mayotte, MD;  Location: [facility] ENDOSCOPY;  Service: Endoscopy;  Laterality: N/A;   EYE SURGERY     JOINT REPLACEMENT     bilateral knee replacements   KNEE ARTHROSCOPY  Arthroscopic left knee surgery    KNEE SURGERY  status post knee surgey    LEFT HEART CATH AND CORONARY ANGIOGRAPHY Left 05/14/2018   Procedure: LEFT HEART CATH AND CORONARY ANGIOGRAPHY;  Surgeon: Lamar Blinks, MD;  Location: [facility] INVASIVE CV LAB;  Service: Cardiovascular;  Laterality: Left;   REPLACEMENT TOTAL KNEE  ([facility])   SHOULDER SURGERY  shoulder operation secondary to a torn tendon   TOTAL HIP ARTHROPLASTY Left 06/12/2021   Procedure: TOTAL HIP ARTHROPLASTY;  Surgeon: Christena Flake, MD;  Location: [facility] ORS;  Service: Orthopedics;  Laterality: Left;    Family Psychiatric History: I have reviewed family psychiatric history from progress  note on 06/26/2020.  Family History:  Family History  Problem Relation Age of Onset   Other Mother        Hit by a fire truck and has had multiple operations on her back , and has history of MVP    Mitral valve prolapse Mother    Lung cancer Mother    Depression Mother    Heart disease Father        myocardial infarction and is status post bypass surgery   Mitral valve prolapse Sister    Bipolar disorder Sister    Hepatitis C Brother    Cirrhosis Brother    Colon cancer Paternal Aunt    Breast cancer Neg Hx    Prostate cancer Neg Hx    Bladder Cancer Neg Hx    Kidney cancer Neg Hx     Social History: I have reviewed social history from progress note on 06/26/2020. Social History   Socioeconomic History   Marital status: Widowed    Spouse name: Dominigue Kenison   Number of children: 1   Years of education: 12   Highest education level: Associate degree: occupational, Scientist, product/process development, or vocational program  Occupational History    Employer: [employer]  Tobacco Use   Smoking status: Every Day     Current packs/day: 2.00    Average packs/day: 2.0 packs/day for 45.0 years (90.0 ttl pk-yrs)    Types: Cigarettes    Passive exposure: Current   Smokeless tobacco: Never   Tobacco comments:    Smokes 18 cigarettes every day 12/24/22  Vaping Use   Vaping status: Former   Substances: Flavoring  Substance and Sexual Activity   Alcohol use: No    Alcohol/week: 0.0 standard drinks of alcohol   Drug use: No   Sexual activity: Not Currently  Other Topics Concern   Not on file  Social History Narrative   Not on file   Social Determinants of Health   Financial Resource Strain: Low Risk  (03/13/2023)   Overall Financial Resource Strain (CARDIA)    Difficulty of Paying Living Expenses: Not very hard  Recent Concern: Financial Resource Strain - Medium Risk (03/11/2023)   Overall Financial Resource Strain (CARDIA)    Difficulty of Paying Living Expenses: Somewhat hard  Food Insecurity: Food Insecurity Present (03/13/2023)   Hunger Vital Sign    Worried About Running Out of Food in the Last Year: Sometimes true    Ran Out of Food in the Last Year: Never true  Transportation Needs: No Transportation Needs (03/13/2023)   PRAPARE - Administrator, Civil Service (Medical): No    Lack of Transportation (Non-Medical): No  Physical Activity: Inactive (03/13/2023)   Exercise Vital Sign    Days of Exercise per Week: 0 days    Minutes of Exercise per Session: 0 min  Stress: Stress Concern Present (03/13/2023)   [employer] of Occupational Health - Occupational Stress Questionnaire    Feeling of Stress : To some extent  Social Connections: Socially Isolated (03/13/2023)   Social Connection and Isolation Panel [NHANES]    Frequency of Communication with Friends and Family: More than three times a week    Frequency of Social Gatherings with Friends and Family: Twice a week    Attends Religious Services: Never    Database administrator or Organizations: No    Attends Tax inspector Meetings: Never    Marital Status: Widowed    Allergies:  Allergies  Allergen Reactions   Varenicline  Other (See Comments)    "I got really depressed" "I got really depressed" CHANTEX   Varenicline Tartrate    Jardiance [Empagliflozin] Other (See Comments)    Yeast infection   Metformin And Related Other (See Comments)    Diarrhea, even with XR   Methylprednisolone Nausea And Vomiting and Nausea Only    As per pt she is not allergic     Metabolic Disorder Labs: Lab Results  Component Value Date   HGBA1C 6.3 05/14/2023   No results found for: "PROLACTIN" Lab Results  Component Value Date   CHOL 99 05/14/2023   TRIG 83.0 05/14/2023   HDL 37.90 (L) 05/14/2023   CHOLHDL 3 05/14/2023   VLDL 16.6 05/14/2023   LDLCALC 44 05/14/2023   LDLCALC 53 12/20/2022   Lab Results  Component Value Date   TSH 1.23 06/17/2022   TSH 0.87 07/06/2021    Therapeutic Level Labs: No results found for: "LITHIUM" No results found for: "VALPROATE" No results found for: "CBMZ"  Current Medications: Current Outpatient Medications  Medication Sig Dispense Refill   buPROPion (WELLBUTRIN SR) 150 MG 12 hr tablet Take 1 tablet (150 mg total) by mouth 2 (two) times daily. 180 tablet 0   albuterol (VENTOLIN HFA) 108 (90 Base) MCG/ACT inhaler INHALE TWO PUFFS FOUR TIMES DAILY 8.5 g 11   amLODipine (NORVASC) 5 MG tablet Take 1 tablet (5 mg total) by mouth daily. 90 tablet 3   apixaban (ELIQUIS) 5 MG TABS tablet Take 1 tablet (5 mg total) by mouth 2 (two) times daily. 180 tablet 1   Budeson-Glycopyrrol-Formoterol (BREZTRI AEROSPHERE) 160-9-4.8 MCG/ACT AERO Inhale 2 puffs into the lungs in the morning and at bedtime. 32.1 g 3   CALCIUM PO Take 600 mg by mouth daily.      cholecalciferol (VITAMIN D3) 25 MCG (1000 UNIT) tablet Take 1,000 Units by mouth daily.     Continuous Blood Gluc Receiver DEVI Use as directed to check blood sugars daily 1 each 0   Continuous Glucose Sensor (DEXCOM G7  SENSOR) MISC Apply 1 sensor every 10 days. 3 each 3   Cyanocobalamin (VITAMIN B-12 PO) Take 2,500 mcg by mouth daily.     DULoxetine (CYMBALTA) 60 MG capsule Take 1 capsule (60 mg total) by mouth daily. 90 capsule 1   famotidine (PEPCID) 40 MG tablet Take 1 tablet (40 mg total) by mouth daily. 90 tablet 3   HM MELATONIN PO 5 mg by Other route at bedtime as needed. patch (Patient not taking: Reported on 07/14/2023)     isosorbide mononitrate (IMDUR) 30 MG 24 hr tablet Take 1 tablet (30 mg total) by mouth 2 (two) times daily. 90 tablet 3   Multiple Vitamin (MULTIVITAMIN) tablet Take 1 tablet by mouth daily.     mupirocin ointment (BACTROBAN) 2 % Apply 1 Application topically 2 (two) times daily. 22 g 0   nitroGLYCERIN (NITROSTAT) 0.4 MG SL tablet Place 1 tablet (0.4 mg total) under the tongue every 5 (five) minutes as needed for chest pain. 25 tablet 3   nystatin (MYCOSTATIN/NYSTOP) powder APPLY TO THE AFFECTED AREA(S) TWICE DAILY 30 g 0   pantoprazole (PROTONIX) 40 MG tablet TAKE 1 TABLET TWICE DAILY BEFORE MEALS 180 tablet 3   propranolol ER (INDERAL LA) 80 MG 24 hr capsule Take 1 capsule (80 mg total) by mouth 2 (two) times daily. 180 capsule 2   rOPINIRole (REQUIP) 0.5 MG tablet TAKE 1 TABLET BY MOUTH AT LUNCHTIME AND TAKE 3MG  AT BEDTIME 90 tablet  1   rOPINIRole (REQUIP) 3 MG tablet TAKE ONE TABLET BY MOUTH AT BEDTIME. TAKE ALONG WITH 0.5MG  DAILY FOR A TOTAL DOSE OF 3.5MG  DAILY 90 tablet 1   rosuvastatin (CRESTOR) 20 MG tablet Take 1 tablet (20 mg total) by mouth daily. 90 tablet 10   Semaglutide, 1 MG/DOSE, 4 MG/3ML SOPN Inject 1 mg as directed once a week. 3 mL 12   telmisartan (MICARDIS) 80 MG tablet Take 1 tablet (80 mg total) by mouth daily. 90 tablet 1   traZODone (DESYREL) 100 MG tablet TAKE ONE AND ONE-HALF TO TWO TABLETS BY MOUTH AT BEDTIME 180 tablet 1   triamcinolone cream (KENALOG) 0.1 % Apply 1 Application topically 2 (two) times daily. 30 g 0   trimethoprim (TRIMPEX) 100 MG tablet  Take 1 tablet (100 mg total) by mouth daily. 90 tablet 3   Vibegron (GEMTESA) 75 MG TABS Take 1 tablet (75 mg total) by mouth daily. 90 tablet 3   No current facility-administered medications for this visit.     Musculoskeletal: Strength & Muscle Tone:  UTA Gait & Station:  Seated Patient leans: N/A  Psychiatric Specialty Exam: Review of Systems  Psychiatric/Behavioral: Negative.      There were no vitals taken for this visit.There is no height or weight on file to calculate BMI.  General Appearance: Fairly Groomed  Eye Contact:  Fair  Speech:  Clear and Coherent  Volume:  Normal  Mood:  Euthymic  Affect:  Congruent  Thought Process:  Goal Directed and Descriptions of Associations: Intact  Orientation:  Full (Time, Place, and Person)  Thought Content: Logical   Suicidal Thoughts:  No  Homicidal Thoughts:  No  Memory:  Immediate;   Fair Recent;   Fair Remote;   Fair  Judgement:  Fair  Insight:  Fair  Psychomotor Activity:  Normal  Concentration:  Concentration: Fair and Attention Span: Fair  Recall:  [employer] of Knowledge: Fair  Language: Fair  Akathisia:  No  Handed:  Right  AIMS (if indicated): not done  Assets:  Communication Skills Desire for Improvement Social Support  ADL's:  Intact  Cognition: WNL  Sleep:  Fair   Screenings: Midwife Visit from 05/14/2022 in [facility] Video Visit from 04/03/2022 in [facility]  AIMS Total Score 0 0      GAD-7    Flowsheet Row Counselor from 12/20/2022 in [facility] Video Visit from 06/25/2021 in [facility] Video Visit from 05/03/2021 in [facility] Video Visit from 04/05/2021 in [facility]  Total GAD-7 Score 6 6 3 9       PHQ2-9    Flowsheet Row Office  Visit from 03/28/2023 in [facility] [employer] Clinical Support from 03/17/2023 in [facility] [employer] from 12/20/2022 in [facility] Video Visit from 12/10/2022 in [facility] Video Visit from 11/27/2022 in [facility]  PHQ-2 Total Score 1 0 1 1 6   PHQ-9 Total Score 8 -- 8 -- 19      Flowsheet Row Video Visit from 06/24/2023 in [facility] Video Visit from 02/06/2023 in [facility] Counselor from 12/20/2022 in [facility]  C-SSRS RISK CATEGORY  No Risk No Risk No Risk        Assessment and Plan: ZAIDY ZINNO is a 67 year old Caucasian female who has a history of MDD, anxiety disorder, bereavement, sleep problems was evaluated by telemedicine today.  Patient is currently stable with regards to her mood however does struggle with smoking and is interested in dosage increase of Wellbutrin, plan as noted below.  Plan MDD in full remission Cymbalta 60 mg p.o. daily Trazodone 150-200 mg p.o. daily Continue CBT with Ms. Primus Bravo  Other specified anxiety disorder with limited symptom attack-stable Cymbalta 60 mg p.o. daily  Insomnia-stable Requip 3 mg at bedtime and 0.5 mg in the afternoon Trazodone 150-200 mg p.o. nightly Continue CPAP for OSA  RLS-stable Requip as prescribed  Bereavement-resolved Continue CBT  Tobacco use disorder-unstable Increase Wellbutrin SR 150 mg p.o. 2 times daily Patient could not afford nicotine patches Patient had side effects to Chantix. Patient to continue to work with therapist as well.  Follow-up in clinic in 2 months or sooner in person.  Collaboration of Care: Collaboration of Care: Referral or follow-up with counselor/therapist AEB  patient encouraged to continue CBT  Patient/Guardian was advised Release of Information must be obtained prior to any record release in order to collaborate their care with an outside provider. Patient/Guardian was advised if they have not already done so to contact the registration department to sign all necessary forms in order for [place] to release information regarding their care.   Consent: Patient/Guardian gives verbal consent for treatment and assignment of benefits for services provided during this visit. Patient/Guardian expressed understanding and agreed to proceed.  This note was generated in part or whole with voice recognition software. Voice recognition is usually quite accurate but there are transcription errors that can and very often do occur. I apologize for any typographical errors that were not detected and corrected.     Jomarie Longs, MD 08/04/2023, 3:19 PM

## 2023-08-05 ENCOUNTER — Telehealth (INDEPENDENT_AMBULATORY_CARE_PROVIDER_SITE_OTHER): Payer: Medicare Other | Admitting: Internal Medicine

## 2023-08-05 ENCOUNTER — Encounter: Payer: Self-pay | Admitting: Nurse Practitioner

## 2023-08-05 ENCOUNTER — Telehealth: Payer: Self-pay | Admitting: Internal Medicine

## 2023-08-05 VITALS — Resp 16 | Ht 64.0 in

## 2023-08-05 DIAGNOSIS — G4733 Obstructive sleep apnea (adult) (pediatric): Secondary | ICD-10-CM | POA: Diagnosis not present

## 2023-08-05 DIAGNOSIS — Z79899 Other long term (current) drug therapy: Secondary | ICD-10-CM | POA: Diagnosis not present
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
## 2023-08-05 NOTE — Progress Notes (Signed)
THERAPIST PROGRESS NOTE  Session Time: 9:20AM-10:09AM  Participation Level: Active  Behavioral Response: Casual and Well GroomedAlertDepressed  Type of Therapy: Individual Therapy  Treatment Goals addressed:  Reduce overall frequency, intensity and duration of anxiety so that daily functioning is not impaired per pt self report 3 out of 5 sessions.   Reduce overall frequency, intensity and duration of depression so that daily functioning is not impaired per pt self report 3 out of 5 sessions documented.   Recall traumatic events without becoming overwhelmed with negative emotions  ProgressTowards Goals: Progressing  Interventions: CBT, DBT, Motivational Interviewing, Strength-based, and Other: ACT  Virtual Visit via Video Note  I connected with Kathryn Friedman on 05/15/2023 at  9:00 AM EDT by a video enabled telemedicine application and verified that I am speaking with the correct person using two identifiers.  Location: Patient: located in pt home Provider: working remotely in [place], [place]   I discussed the limitations of evaluation and management by telemedicine and the availability of in person appointments. The patient expressed understanding and agreed to proceed.  I discussed the assessment and treatment plan with the patient. The patient was provided an opportunity to ask questions and all were answered. The patient agreed with the plan and demonstrated an understanding of the instructions.   The patient was advised to call back or seek an in-person evaluation if the symptoms worsen or if the condition fails to improve as anticipated.  I provided 49 minutes of non-face-to-face time during this encounter.   Kathryn Paradise, LCSW  Summary: Kathryn Friedman is a 67 y.o. female who presents with mixed sxs of anxiety and depression re: a hx of trauma, bereavement and experiences with current stressors. Pt oriented to person, place, and time. Pt denies SI/HI or A/V  hallucinations. Pt was cooperative during visit and was engaged throughout the visit. Pt does not report any other concerns at the time of visit.  Pt reported need for condensed appt due to loaded plate today.   Revisited this pt from previous appt: Pt reported desire to grow socially. Discussed checking emails at a coffee shop or over breakfast vs at home. Discussed small ways to socially reintegrate into the world. Pt reported being afraid of what people will see when they see her. Discussed ways to pour into self physically. Pt shared ways she helps her dog dress up. Invited pt to play dress up and have a makeover with her dog each day. Pt reported that she was never taught how to be a lady. LCSW identified how pt teaches her dog how to be a lady and suggested learning with her dog.  Invited pt to offer self a praise with every criticism.   LCSW provided mood monitoring and treatment progress review in the context of this episode of treatment. LCSW reviewed the pt's mood status since last session.   Pt is continuing to apply interventions/techniques learned in session into daily life situations. Pt is currently on track to meet goals utilizing interventions that are discussed in session. Treatment to continue as indicated. Personal growth and progress toward goals noted above.  Continued Recommendations as followed: Self-care behaviors, positive social engagements, focusing on positive physical and emotional wellness, and focusing on life/work balance.     Suicidal/Homicidal: Nowithout intent/plan  Therapist Response:  Provided pt education re: acceptance. Discussed how to make acceptance accessible at all parts of pt's healing journey.   LCSW practiced active listening to validate pt participation, build rapport, and  create safe space for pt to feel heard as they are disclosing their thoughts and feelings.   Approached pt with strengths based perspective to assist pt in exploring strengths  in moments of feeling low.   LCSW utilized therapeutic conversation skills informed by CBT, DBT, and ACT to expose pt to multiple ways of thinking about healing and to provide pt to access to multiple interventions.  Plan: Return again in 2 weeks.  Diagnosis: PTSD (post-traumatic stress disorder)  MDD (major depressive disorder), recurrent, in full remission (HCC)    12/20/2022   10:19 AM 06/25/2021    3:25 PM 05/03/2021   10:33 AM 04/05/2021    1:47 PM  GAD 7 : Generalized Anxiety Score  Nervous, Anxious, on Edge 0 3 1 0  Control/stop worrying 1 1 0 3  Worry too much - different things 1 1 0 3  Trouble relaxing 1 0 1 2  Restless 2 0 1 0  Easily annoyed or irritable 1 1 0 1  Afraid - awful might happen 0 0 0 0  Total GAD 7 Score 6 6 3 9   Anxiety Difficulty Somewhat difficult Not difficult at all Somewhat difficult Somewhat difficult       03/28/2023   10:38 AM 03/17/2023   11:44 AM 12/20/2022   10:19 AM  Depression screen PHQ 2/9  Decreased Interest 0 0 1  Down, Depressed, Hopeless 1 0 0  PHQ - 2 Score 1 0 1  Altered sleeping 2  3  Tired, decreased energy 1  1  Change in appetite 2  1  Feeling bad or failure about yourself  2  1  Trouble concentrating 0  1  Moving slowly or fidgety/restless 0  0  Suicidal thoughts 0  0  PHQ-9 Score 8  8  Difficult doing work/chores Not difficult at all      Collaboration of Care: Psychiatrist AEB Dr. Elna Breslow  Patient/Guardian was advised Release of Information must be obtained prior to any record release in order to collaborate their care with an outside provider. Patient/Guardian was advised if they have not already done so to contact the registration department to sign all necessary forms in order for [place] to release information regarding their care.   Consent: Patient/Guardian gives verbal consent for treatment and assignment of benefits for services provided during this visit. Patient/Guardian expressed understanding and agreed to proceed.    Memory Dance Chanson Teems, LCSW

## 2023-08-05 NOTE — Telephone Encounter (Addendum)
Cpap pressure adjustment order faxed to [facility]; [phone]. Notified patient. She will call me back once she is on new pressure so we can schedule appointment with [email or web address]

## 2023-08-05 NOTE — Progress Notes (Signed)
THERAPIST PROGRESS NOTE  Session Time: 11:04AM-11:22AM  Participation Level: Active  Behavioral Response: Casual and Well GroomedAlertDepressed  Type of Therapy: Individual Therapy  Treatment Goals addressed:  Reduce overall frequency, intensity and duration of anxiety so that daily functioning is not impaired per pt self report 3 out of 5 sessions.   Reduce overall frequency, intensity and duration of depression so that daily functioning is not impaired per pt self report 3 out of 5 sessions documented.   Recall traumatic events without becoming overwhelmed with negative emotions   ProgressTowards Goals: Progressing  Interventions: CBT, DBT, Motivational Interviewing, Strength-based, Reframing, and Other: ACT  Virtual Visit via Video Note  I connected with Kathryn Friedman on 07/18/2023 at 11:00 AM EDT by a video enabled telemedicine application and verified that I am speaking with the correct person using two identifiers.  Location: Patient: located in pt home Provider: working remotely in [place], [place]   I discussed the limitations of evaluation and management by telemedicine and the availability of in person appointments. The patient expressed understanding and agreed to proceed.  I discussed the assessment and treatment plan with the patient. The patient was provided an opportunity to ask questions and all were answered. The patient agreed with the plan and demonstrated an understanding of the instructions.   The patient was advised to call back or seek an in-person evaluation if the symptoms worsen or if the condition fails to improve as anticipated.  I provided 18 minutes of non-face-to-face time during this encounter.   Kathryn Paradise, LCSW  Summary: Kathryn Friedman is a 67 y.o. female who presents with mixed sxs of anxiety and depression related to hx of trauma including but not limited to lack of motivation, lack of interest fatigue, negative self affect,  nervousness, worry, and difficulty controlling worry.Patient oriented to person, place, and time. Pt denies SI/HI or A/V hallucinations. Pt was cooperative during visit and was engaged throughout the visit. Pt does not report any other concerns at the time of visit.  Patient requested a condensed session due to increased fatigue from weekly stressors. Patient identified stressors contributing to fatigue and identified ways in which she is managing her stress and response to her stress. Patient reported feeling increased worry due to seeing her pet not feeling well and also identified ways that she is experiencing hopefulness. Patient identified interpersonal conflict with a friend and named ways she is practicing problem solving with her friend.  Patient reported that she is looking forward to celebrating her birthday with her sister this weekend and shared that she and her sister are working to reclaim their relationship from their childhood. Patient reported desire to practice and work on trauma processing in upcoming sessions and reported feeling ready to engage in processing work. Patient reported no additional questions or concerns at this time.  LCSW provided mood monitoring and treatment progress review in the context of this episode of treatment. LCSW reviewed the pt's mood status since last session.   Pt is continuing to apply interventions/techniques learned in session into daily life situations. Pt is currently on track to meet goals utilizing interventions that are discussed in session. Treatment to continue as indicated. Personal growth and progress toward goals noted above.  Continued Recommendations as followed: Self-care behaviors, positive social engagements, focusing on positive physical and emotional wellness, and focusing on life/work balance.    Suicidal/Homicidal: Nowithout intent/plan  Therapist Response:  Provided pt education re: acceptance. Discussed how to make acceptance  accessible  at all parts of pt's healing journey.   LCSW practiced active listening to validate pt participation, build rapport, and create safe space for pt to feel heard as they are disclosing their thoughts and feelings.   Approached pt with strengths based perspective to assist pt in exploring strengths in moments of feeling low.   LCSW utilized therapeutic conversation skills informed by CBT, DBT, and ACT to expose pt to multiple ways of thinking about healing and to provide pt to access to multiple interventions.  Plan: Return again in 2 weeks.Pt has requested to transition care to [facility] for all future appts in order to continue receiving therapeutic services from [facility]. Pt was provided with other options for continuing care and reported to continue with [facility] for continuity of care.    Diagnosis: PTSD (post-traumatic stress disorder)  MDD (major depressive disorder), recurrent episode, mild (HCC)  Anxiety disorder, unspecified type    12/20/2022   10:19 AM 06/25/2021    3:25 PM 05/03/2021   10:33 AM 04/05/2021    1:47 PM  GAD 7 : Generalized Anxiety Score  Nervous, Anxious, on Edge 0 3 1 0  Control/stop worrying 1 1 0 3  Worry too much - different things 1 1 0 3  Trouble relaxing 1 0 1 2  Restless 2 0 1 0  Easily annoyed or irritable 1 1 0 1  Afraid - awful might happen 0 0 0 0  Total GAD 7 Score 6 6 3 9   Anxiety Difficulty Somewhat difficult Not difficult at all Somewhat difficult Somewhat difficult       03/28/2023   10:38 AM 03/17/2023   11:44 AM 12/20/2022   10:19 AM  Depression screen PHQ 2/9  Decreased Interest 0 0 1  Down, Depressed, Hopeless 1 0 0  PHQ - 2 Score 1 0 1  Altered sleeping 2  3  Tired, decreased energy 1  1  Change in appetite 2  1  Feeling bad or failure about yourself  2  1  Trouble concentrating 0  1  Moving slowly or fidgety/restless 0  0  Suicidal thoughts 0  0  PHQ-9 Score 8  8  Difficult doing  work/chores Not difficult at all      Collaboration of Care: Psychiatrist AEB Dr. Elna Breslow  Patient/Guardian was advised Release of Information must be obtained prior to any record release in order to collaborate their care with an outside provider. Patient/Guardian was advised if they have not already done so to contact the registration department to sign all necessary forms in order for [place] to release information regarding their care.   Consent: Patient/Guardian gives verbal consent for treatment and assignment of benefits for services provided during this visit. Patient/Guardian expressed understanding and agreed to proceed.   Kathryn Paradise, LCSW 08/05/2023
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
## 2023-08-05 NOTE — Progress Notes (Signed)
THERAPIST PROGRESS NOTE  Session Time: 9:03AM-10:00AM  Participation Level: Active  Behavioral Response: Casual and Well GroomedAlertDepressed  Type of Therapy: Individual Therapy  Treatment Goals addressed:  Reduce overall frequency, intensity and duration of anxiety so that daily functioning is not impaired per pt self report 3 out of 5 sessions.   Reduce overall frequency, intensity and duration of depression so that daily functioning is not impaired per pt self report 3 out of 5 sessions documented.   Recall traumatic events without becoming overwhelmed with negative emotions  ProgressTowards Goals: Progressing  Interventions: CBT, DBT, Motivational Interviewing, Strength-based, and Other: ACT  Virtual Visit via Video Note  I connected with Kathryn Friedman on 05/01/2023 at  9:00 AM EDT by a video enabled telemedicine application and verified that I am speaking with the correct person using two identifiers.  Location: Patient: located in pt home Provider: working remotely in [place], [place]   I discussed the limitations of evaluation and management by telemedicine and the availability of in person appointments. The patient expressed understanding and agreed to proceed.  I discussed the assessment and treatment plan with the patient. The patient was provided an opportunity to ask questions and all were answered. The patient agreed with the plan and demonstrated an understanding of the instructions.   The patient was advised to call back or seek an in-person evaluation if the symptoms worsen or if the condition fails to improve as anticipated.  I provided 57 minutes of non-face-to-face time during this encounter.   Geoffry Paradise, LCSW  Summary: Kathryn Friedman is a 67 y.o. female who presents with mixed sxs of anxiety and depression re: a hx of trauma, bereavement and experiences with current stressors. Pt oriented to person, place, and time. Pt denies SI/HI or A/V  hallucinations. Pt was cooperative during visit and was engaged throughout the visit. Pt does not report any other concerns at the time of visit.  Pt reported need for condensed appt due to loaded plate today.   Revisited this pt from previous appt: Pt reported desire to grow socially. Discussed checking emails at a coffee shop or over breakfast vs at home. Discussed small ways to socially reintegrate into the world. Pt reported being afraid of what people will see when they see her. Discussed ways to pour into self physically. Pt shared ways she helps her dog dress up. Invited pt to play dress up and have a makeover with her dog each day. Pt reported that she was never taught how to be a lady. LCSW identified how pt teaches her dog how to be a lady and suggested learning with her dog.  Invited pt to offer self a praise with every criticism.   LCSW provided mood monitoring and treatment progress review in the context of this episode of treatment. LCSW reviewed the pt's mood status since last session.   Pt is continuing to apply interventions/techniques learned in session into daily life situations. Pt is currently on track to meet goals utilizing interventions that are discussed in session. Treatment to continue as indicated. Personal growth and progress toward goals noted above.  Continued Recommendations as followed: Self-care behaviors, positive social engagements, focusing on positive physical and emotional wellness, and focusing on life/work balance.     Suicidal/Homicidal: Nowithout intent/plan  Therapist Response:  Provided pt education re: acceptance. Discussed how to make acceptance accessible at all parts of pt's healing journey.   LCSW practiced active listening to validate pt participation, build rapport, and  create safe space for pt to feel heard as they are disclosing their thoughts and feelings.   Approached pt with strengths based perspective to assist pt in exploring strengths  in moments of feeling low.   LCSW utilized therapeutic conversation skills informed by CBT, DBT, and ACT to expose pt to multiple ways of thinking about healing and to provide pt to access to multiple interventions.  Plan: Return again in 2 weeks.  Diagnosis: PTSD (post-traumatic stress disorder)  MDD (major depressive disorder), recurrent episode, moderate (HCC)  Anxiety disorder, unspecified type    12/20/2022   10:19 AM 06/25/2021    3:25 PM 05/03/2021   10:33 AM 04/05/2021    1:47 PM  GAD 7 : Generalized Anxiety Score  Nervous, Anxious, on Edge 0 3 1 0  Control/stop worrying 1 1 0 3  Worry too much - different things 1 1 0 3  Trouble relaxing 1 0 1 2  Restless 2 0 1 0  Easily annoyed or irritable 1 1 0 1  Afraid - awful might happen 0 0 0 0  Total GAD 7 Score 6 6 3 9   Anxiety Difficulty Somewhat difficult Not difficult at all Somewhat difficult Somewhat difficult       03/28/2023   10:38 AM 03/17/2023   11:44 AM 12/20/2022   10:19 AM  Depression screen PHQ 2/9  Decreased Interest 0 0 1  Down, Depressed, Hopeless 1 0 0  PHQ - 2 Score 1 0 1  Altered sleeping 2  3  Tired, decreased energy 1  1  Change in appetite 2  1  Feeling bad or failure about yourself  2  1  Trouble concentrating 0  1  Moving slowly or fidgety/restless 0  0  Suicidal thoughts 0  0  PHQ-9 Score 8  8  Difficult doing work/chores Not difficult at all      Collaboration of Care: Psychiatrist AEB Dr. Elna Breslow  Patient/Guardian was advised Release of Information must be obtained prior to any record release in order to collaborate their care with an outside provider. Patient/Guardian was advised if they have not already done so to contact the registration department to sign all necessary forms in order for [place] to release information regarding their care.   Consent: Patient/Guardian gives verbal consent for treatment and assignment of benefits for services provided during this visit. Patient/Guardian expressed  understanding and agreed to proceed.   Memory Dance Damien Cisar, LCSW

## 2023-08-07 ENCOUNTER — Telehealth: Payer: Self-pay | Admitting: Internal Medicine

## 2023-08-07 NOTE — Telephone Encounter (Signed)
Followed up with patient. She stated her cpap pressure has been adjusted as prescribed by dfk. Made follow up appointment with dsk-Toni

## 2023-08-10 ENCOUNTER — Encounter: Payer: Self-pay | Admitting: Internal Medicine

## 2023-08-11 ENCOUNTER — Ambulatory Visit: Payer: Medicare Other | Admitting: Internal Medicine

## 2023-08-11 ENCOUNTER — Telehealth: Payer: Self-pay | Admitting: Internal Medicine

## 2023-08-11 NOTE — Telephone Encounter (Signed)
Received call from patient over the weekend. She stated [facility] told her cpap pressure change order was not correct, so her pressure has not be changed.  Called [employer], was told order needed to be signed, dated and provider NPI. Gave to dfk. Received call back from Harmon with [employer], she stated order looked fine. I asked if this could be expedited for today. She will pass request to clinical team-Toni

## 2023-08-12 ENCOUNTER — Telehealth: Payer: Self-pay | Admitting: Internal Medicine

## 2023-08-12 NOTE — Telephone Encounter (Signed)
Faxed urgent pressure decrease on cpap to [facility]; [phone]. Scanned-Toni

## 2023-08-18 ENCOUNTER — Telehealth: Payer: Self-pay | Admitting: Internal Medicine

## 2023-08-18 ENCOUNTER — Other Ambulatory Visit: Payer: Self-pay | Admitting: Psychiatry

## 2023-08-18 DIAGNOSIS — F17200 Nicotine dependence, unspecified, uncomplicated: Secondary | ICD-10-CM

## 2023-08-18 NOTE — Telephone Encounter (Signed)
Prescription Request  08/18/2023  LOV: 03/28/2023  What is the name of the medication or equipment? ozempic  Have you contacted your pharmacy to request a refill? Yes   Which pharmacy would you like this sent to? [facility] [address]   Patient notified that their request is being sent to the clinical staff for review and that they should receive a response within 2 business days.   Please advise at Mobile [phone] (mobile)

## 2023-08-18 NOTE — Telephone Encounter (Signed)
Noted. Please hold to confirm she gets her medication.

## 2023-08-18 NOTE — Telephone Encounter (Signed)
FYI

## 2023-08-19 ENCOUNTER — Telehealth: Payer: Self-pay | Admitting: Cardiovascular Disease

## 2023-08-19 DIAGNOSIS — I480 Paroxysmal atrial fibrillation: Secondary | ICD-10-CM

## 2023-08-19 MED ORDER — SEMAGLUTIDE (1 MG/DOSE) 4 MG/3ML ~~LOC~~ SOPN: 1.0000 mg | PEN_INJECTOR | SUBCUTANEOUS | 0 refills | Status: DC

## 2023-08-19 MED ORDER — APIXABAN 5 MG PO TABS: 5.0000 mg | ORAL_TABLET | Freq: Two times a day (BID) | ORAL | 3 refills | Status: DC

## 2023-08-19 NOTE — Telephone Encounter (Signed)
Pt c/o medication issue:  1. Name of Medication: apixaban (ELIQUIS) 5 MG TABS tablet   2. How are you currently taking this medication (dosage and times per day)?    3. Are you having a reaction (difficulty breathing--STAT)? No   4. What is your medication issue? Patient is in the donut whole with her insurance. Calling to see what other options are there. Please advie

## 2023-08-19 NOTE — Telephone Encounter (Signed)
Already sent see mychart message

## 2023-08-19 NOTE — Telephone Encounter (Signed)
Spoke with patient and reviewed documentation we will need to process an application for assistance. She reports getting her social security and pharmacy reports. She was appreciative for the call with no further questions.  Application has been completed and all we need is her signature on one page along with her social security statement with out of pocket expense report. Placed in my Pending folder.

## 2023-08-20 ENCOUNTER — Other Ambulatory Visit (HOSPITAL_COMMUNITY): Payer: Self-pay

## 2023-08-20 ENCOUNTER — Telehealth: Payer: Self-pay

## 2023-08-20 DIAGNOSIS — I890 Lymphedema, not elsewhere classified: Secondary | ICD-10-CM | POA: Diagnosis not present

## 2023-08-20 NOTE — Telephone Encounter (Signed)
Pharmacy Patient Advocate Encounter   Received notification from Physician's Office that prior authorization for Ozempic is required/requested.   Insurance verification completed.   The patient is insured through [employer] .   Per test claim: PA required; PA submitted to [facility] via CoverMyMeds Key/confirmation #/EOC Key: [id number] Status is pending

## 2023-08-21 ENCOUNTER — Encounter (INDEPENDENT_AMBULATORY_CARE_PROVIDER_SITE_OTHER): Payer: Medicare Other | Admitting: Internal Medicine

## 2023-08-21 ENCOUNTER — Other Ambulatory Visit: Payer: Self-pay

## 2023-08-21 ENCOUNTER — Telehealth: Payer: Self-pay | Admitting: Internal Medicine

## 2023-08-21 DIAGNOSIS — G4719 Other hypersomnia: Secondary | ICD-10-CM

## 2023-08-21 MED ORDER — TELMISARTAN 80 MG PO TABS: 80.0000 mg | ORAL_TABLET | Freq: Every day | ORAL | 1 refills | Status: DC

## 2023-08-21 NOTE — Telephone Encounter (Signed)
Kathryn Friedman from [employer] order called about pt med refill for telmisartan

## 2023-08-21 NOTE — Telephone Encounter (Signed)
MEDICATION REFILLED

## 2023-08-22 ENCOUNTER — Other Ambulatory Visit (HOSPITAL_COMMUNITY): Payer: Self-pay

## 2023-08-22 NOTE — Telephone Encounter (Signed)
Pharmacy Patient Advocate Encounter  Received notification from [facility] that Prior Authorization for [facility] has been APPROVED from 9.27.24 to 9.27.25. Ran test claim, Copay is $229.62 FOR 1 MONTH. This test claim was processed through [facility]- copay amounts may vary at other pharmacies due to pharmacy/plan contracts, or as the patient moves through the different stages of their insurance plan.   PA #/Case ID/Reference #: Key: [id number]

## 2023-08-25 ENCOUNTER — Ambulatory Visit: Payer: Medicare Other | Admitting: Internal Medicine

## 2023-08-25 NOTE — Telephone Encounter (Signed)
Noted. Patient has voucher from [employer].

## 2023-08-27 DIAGNOSIS — M47816 Spondylosis without myelopathy or radiculopathy, lumbar region: Secondary | ICD-10-CM | POA: Diagnosis not present

## 2023-08-27 DIAGNOSIS — M7062 Trochanteric bursitis, left hip: Secondary | ICD-10-CM | POA: Diagnosis not present

## 2023-08-27 DIAGNOSIS — M25811 Other specified joint disorders, right shoulder: Secondary | ICD-10-CM | POA: Diagnosis not present

## 2023-08-27 DIAGNOSIS — M7581 Other shoulder lesions, right shoulder: Secondary | ICD-10-CM | POA: Diagnosis not present

## 2023-08-27 DIAGNOSIS — M19011 Primary osteoarthritis, right shoulder: Secondary | ICD-10-CM | POA: Diagnosis not present

## 2023-08-28 ENCOUNTER — Telehealth: Payer: Self-pay | Admitting: Cardiovascular Disease

## 2023-08-28 ENCOUNTER — Encounter: Payer: Self-pay | Admitting: Urology

## 2023-08-28 DIAGNOSIS — I480 Paroxysmal atrial fibrillation: Secondary | ICD-10-CM

## 2023-08-28 NOTE — Telephone Encounter (Signed)
Spoke with patient and reviewed that we can provide her samples until we can get her application sent in to see if she is approved. She states that she will come by one day next week to pick those up and sign her application. Application placed in Pending Patient Assistance. She verbalized understanding with no further questions.

## 2023-08-28 NOTE — Telephone Encounter (Signed)
Patient calling the office for samples of medication:   1.  What medication and dosage are you requesting samples for? apixaban (ELIQUIS) 5 MG TABS tablet  2.  Are you currently out of this medication? No    

## 2023-08-28 NOTE — Telephone Encounter (Signed)
Application placed in Pending Patient Assistance. See other telephone encounter regarding samples. When she comes in to pick those up we will have her sign application at that time.

## 2023-08-29 ENCOUNTER — Encounter: Payer: Self-pay | Admitting: *Deleted

## 2023-08-29 NOTE — Procedures (Signed)
[facility]  Polysomnogram Report Part I                                                               Phone: [phone] Fax: [phone]  Patient Name: Kathryn Friedman, Kathryn Friedman. Acquisition Number: [id number]  Date of Birth: [birth year] Acquisition Date: 08/21/2023  Referring Physician: Yevonne Pax, MD     History: The patient is a 67 year old  who was referred for re-evaluation of obstructive sleep apnea. Medical History: anemia, anginal pain, anxiety, arthritis back and knees, asthma, bilateral carotid artery stenosis, blood in stool, chronic diarrhea, COPD, coronary artery disease, clopidogrel, depression, diverticulitis, diverticulosis, dysphagia, dyspnea, dysthythmia, GERD, headache, hypertension, hypertriglyceridemia, ILD, lump in the abdomen, overactive bladder, PSVT, spastic colon, T2DM, tobacco abuse, venous insuffiency of both extremities.  Medications: Ventolin HFA, Eliquis, breztriaerosphere, Wellbutrin, calcium, vitamin D3, continuous blood gluc receiver DEVI, Dexcom G7 sensor, vitamin B-12, Cymbalta, Pepcid, HM melatonin, multivitamin, bactroban, Nicoderm CQ, Nitrostat, Nystatin, Inderal LA, Requip, Crestor, semaglutide, Micardis, Desyrel, triamcinolone cream, Trimpex, Gemtesa, Norvasc, Protonix.  Procedure: This routine overnight polysomnogram was performed on the Alice 5 using the standard diagnostic protocol. This included 6 channels of EEG, 2 channels of EOG, chin EMG, bilateral anterior tibialis EMG, nasal/oral thermistor, PTAF (nasal pressure transducer), chest and abdominal wall movements, EKG, and pulse oximetry.  Description: The total recording time was 422.0 minutes. The total sleep time was 372.0 minutes. There were a total of 48.5 minutes of wakefulness after sleep onset for a slightly reducedsleep efficiency of 88.2%. The latency to sleep onset was short at 1.5 minutes. The R sleep onset latency was  Sleep parameters, as a percentage of the total sleep time,  demonstrated 3.5% of sleep was in N1 sleep, 87.2% N2, 0.0% N3 and 9.3% R sleep. There were a total of 178 arousals for an arousal index of 28.7 arousals per hour of sleep that was elevated.  Respiratory monitoring demonstrated   snoring . There were 194 apneas and hypopneas for an Apnea Hypopnea Index of 31.3 apneas and hypopneas per hour of sleep. The REM related apnea hypopnea index was 34.8/hr of REM sleep compared to a NREM AHI of 30.9/hr.  The average duration of the respiratory events was 10.6 seconds with a maximum duration of 18.0 seconds. The respiratory events occurred , however they were more frequent in the supine position with an AHI of 43.6. The respiratory events were associated with peripheral oxygen desaturations on the average to 80%. The lowest oxygen desaturation associated with a respiratory event was 71%. Additionally, the baseline oxygen saturation during wakefulness was 85%, during NREM sleep averaged 84%, and during REM sleep averaged 86%. The total duration of oxygen < 90% was 392.7 minutes and <80% was 13.7 minutes.  Cardiac monitoring-  demonstrate transient cardiac decelerations associated with the apneas.  significant cardiac rhythm irregularities.   Periodic limb movement monitoring- demonstrated that there were 4 periodic limb movements for a periodic limb movement index of 0.6 periodic limb movements per hour of sleep.   Impression: This routine overnight polysomnogram confirmed the presence of severe obstructive sleep apnea with an overall Apnea Hypopnea Index of 31.3 apneas and hypopneas per hour of sleep. The respiratory events were more frequent in the supine position with an AHI of 43.6.  The findings likely underestimate the severity of the sleep apnea as the patient has been using CPAP regularly at home.The lowest desaturation was to 71%. The oxygen saturation was above 90% prior to sleep onset and decreased gradually throughout the night, dropping to below 80% but  early morning.   slightly reduced sleep efficiency with anelevated arousal index, a reduced REM percentage and no slow wave sleep.These findings would appear to be due to the obstructive sleep apnea and likely the patient's COPD as coughing was frequent.  Recommendations:    A CPAP titration would be recommended due to the severity of the sleep apnea. Some supine sleep should be ensured to optimize the titration. Supplemental oxygen should be added if the baseline remains low once the apnea is controlled. Additionally, would recommend weight loss in a patient with a BMI of 35.5.     Kathryn Pax, MD, [facility] Diplomate ABMS-Pulmonary, Critical Care and Sleep Medicine  Electronically reviewed and digitally signed  [facility] Polysomnogram Report Part II  Phone: [phone] Fax: [phone]  Patient last name [name] Neck Size 16.5 in. Acquisition [id number]  Patient first name Kathryn Friedman. Weight 207.0 lbs. Started 08/21/2023 at 10:30:44 PM  Birth date [birth year] Height 64.0 in. Stopped 08/22/2023 at 5:42:56 AM  Age 70 BMI 35.5 lb/in2 Duration 422.0  Study Type Adult      [name] (RPSGT) & [name]  Reviewed by: [name], PhD, ABSM, FAASM Sleep Data: Lights Out: 10:36:44 PM Sleep Onset: 10:38:14 PM  Lights On: 5:38:44 AM Sleep Efficiency: 88.2 %  Total Recording Time: 422.0 min Sleep Latency (from Lights Off) 1.5 min  Total Sleep Time (TST): 372.0 min R Latency (from Sleep Onset): N/A  Sleep Period Time: 420.0 min Total number of awakenings: 12  Wake during sleep: 48.0 min Wake After Sleep Onset (WASO): 48.5 min   Sleep Data:         Arousal Summary: Stage  Latency from lights out (min) Latency from sleep onset (min) Duration (min) % Total Sleep Time  Normal values  N 1 30.0 28.5 13.0 3.5 (5%)  N 2 24.5 23.0 324.5 87.2 (50%)  N 3       0.0 0.0 (20%)  R 1.5 N/A 34.5 9.3 (25%)   Number Index  Spontaneous 126 20.3  Apneas & Hypopneas 22 3.5  RERAs 0  0.0       (Apneas & Hypopneas & RERAs)  (22) (3.5)  Limb Movement 33 5.3  Snore 0 0.0  TOTAL 181 29.2     Respiratory Data:  CA OA MA Apnea Hypopnea* A+ H RERA Total  Number 19 12 0 31 163 194 0 194  Mean Dur (sec) 10.0 10.2 0.0 10.1 10.7 10.6 0.0 10.6  Max Dur (sec) 10.0 11.0 0.0 11.0 18.0 18.0 0.0 18.0  Total Dur (min) 3.2 2.0 0.0 5.2 28.9 34.1 0.0 34.1  % of TST 0.9 0.5 0.0 1.4 7.8 9.2 0.0 9.2  Index (#/h TST) 3.1 1.9 0.0 5.0 26.3 31.3 0.0 31.3  *Hypopneas scored based on 4% or greater desaturation.  Sleep Stage:        REM NREM TST  AHI 34.8 30.9 31.3  RDI 34.8 30.9 31.3           Body Position Data:  Sleep (min) TST (%) REM (min) NREM (min) CA (#) OA (#) MA (#) HYP (#) AHI (#/h) RERA (#) RDI (#/h) Desat (#)  Supine 208.0 55.91 23.0 185.0 12 12  0 127 43.6 0 43.6 143  Non-Supine 164.00 44.09 11.50 152.50 7.00 0.00 0.00 36.00 15.73 0 15.73 53.00  Left: 164.0 44.09 11.5 152.5 7 0 0 36 15.7 0 15.7 53     Snoring: Total number of snoring episodes  0  Total time with snoring    min (   % of sleep)   Oximetry Distribution:             WK REM NREM TOTAL  Average (%)   85 86 84 84  < 90% 32.5 26.0 334.2 392.7  < 80% 0.6 6.9 6.2 13.7  < 70% 0.1 0.0 0.0 0.1  # of Desaturations* 4 21 171 196  Desat Index (#/hour) 6.6 36.5 30.4 31.6  Desat Max (%) 7 8 11 11   Desat Max Dur (sec) 26.0 26.0 41.0 41.0  Approx Min O2 during sleep 71  Approx min O2 during a respiratory event 71  Was Oxygen added (Y/N) and final rate :    LPM  *Desaturations based on 4% or greater drop from baseline.   Cheyne Stokes Breathing: None Present   Heart Rate Summary:  Average Heart Rate During Sleep 70.0 bpm      Highest Heart Rate During Sleep (95th %) 73.0 bpm      Highest Heart Rate During Sleep 138 bpm      Highest Heart Rate During Recording (TIB) 186 bpm (artifact)   Heart Rate Observations: Event Type # Events   Bradycardia 0 Lowest HR Scored: N/A  Sinus Tachycardia  During Sleep 0 Highest HR Scored: N/A  Narrow Complex Tachycardia 0 Highest HR Scored: N/A  Wide Complex Tachycardia 0 Highest HR Scored: N/A  Asystole 0 Longest Pause: N/A  Atrial Fibrillation 0 Duration Longest Event: N/A  Other Arrythmias   Type:    Periodic Limb Movement Data: (Primary legs unless otherwise noted) Total # Limb Movement 44 Limb Movement Index 7.1  Total # PLMS 4 PLMS Index 0.6  Total # PLMS Arousals 4 PLMS Arousal Index 0.6  Percentage Sleep Time with PLMS 3.60min (0.9 % sleep)  Mean Duration limb movements (secs) 201.5

## 2023-09-02 ENCOUNTER — Other Ambulatory Visit: Payer: Self-pay | Admitting: *Deleted

## 2023-09-02 DIAGNOSIS — I480 Paroxysmal atrial fibrillation: Secondary | ICD-10-CM

## 2023-09-02 MED ORDER — APIXABAN 5 MG PO TABS: 5.0000 mg | ORAL_TABLET | Freq: Two times a day (BID) | ORAL | Status: DC

## 2023-09-02 NOTE — Telephone Encounter (Signed)
Pt picked up samples today.

## 2023-09-03 DIAGNOSIS — M19011 Primary osteoarthritis, right shoulder: Secondary | ICD-10-CM | POA: Diagnosis not present

## 2023-09-03 DIAGNOSIS — M7062 Trochanteric bursitis, left hip: Secondary | ICD-10-CM | POA: Diagnosis not present

## 2023-09-03 DIAGNOSIS — Z96642 Presence of left artificial hip joint: Secondary | ICD-10-CM | POA: Diagnosis not present

## 2023-09-08 ENCOUNTER — Ambulatory Visit: Payer: Medicare Other | Admitting: Internal Medicine

## 2023-09-09 ENCOUNTER — Ambulatory Visit: Payer: Medicare Other | Admitting: Internal Medicine

## 2023-09-09 ENCOUNTER — Telehealth: Payer: Self-pay | Admitting: Internal Medicine

## 2023-09-09 DIAGNOSIS — Z79899 Other long term (current) drug therapy: Secondary | ICD-10-CM | POA: Diagnosis not present

## 2023-09-09 NOTE — Telephone Encounter (Signed)
Cpap titration appointment 09/17/2023 @ [facility]

## 2023-09-11 ENCOUNTER — Ambulatory Visit: Payer: Medicare Other | Admitting: Dermatology

## 2023-09-12 ENCOUNTER — Encounter (INDEPENDENT_AMBULATORY_CARE_PROVIDER_SITE_OTHER): Payer: Self-pay | Admitting: Internal Medicine

## 2023-09-12 DIAGNOSIS — G4733 Obstructive sleep apnea (adult) (pediatric): Secondary | ICD-10-CM | POA: Diagnosis not present

## 2023-09-17 DIAGNOSIS — M7062 Trochanteric bursitis, left hip: Secondary | ICD-10-CM | POA: Diagnosis not present

## 2023-09-17 DIAGNOSIS — M5416 Radiculopathy, lumbar region: Secondary | ICD-10-CM | POA: Diagnosis not present

## 2023-09-21 ENCOUNTER — Encounter: Payer: Self-pay | Admitting: Cardiovascular Disease

## 2023-09-21 ENCOUNTER — Encounter: Payer: Self-pay | Admitting: Internal Medicine

## 2023-09-21 NOTE — Procedures (Signed)
[facility] [address] [place], [address]    Complete Pulmonary Function Testing Interpretation:  FINDINGS:  Forced vital capacity is moderately decreased FEV1 is 1.58 L which is 69% predicted and is mildly decreased.  FEV1 FVC ratio is increased.  Postbronchodilator no significant change in FEV1.  Total lung capacity is mildly decreased.  Residual volume is decreased.  FRC is decreased.  DLCO is severely decreased.  IMPRESSION:  This pulmonary function study is consistent with mild restrictive lung disease  Yevonne Pax, MD [facility] Pulmonary Critical Care Medicine Sleep Medicine

## 2023-09-22 MED ORDER — APIXABAN 5 MG PO TABS: 5.0000 mg | ORAL_TABLET | Freq: Two times a day (BID) | ORAL | 0 refills | Status: DC

## 2023-09-22 NOTE — Telephone Encounter (Signed)
The patient has been denied eliquis assistance. She stated that she is in the donut hole right now and cannot afford eliquis.   Samples have been provided for three weeks.

## 2023-09-22 NOTE — Telephone Encounter (Signed)
Pt states that her patient assistance was denied and she would like to know what to do now. Please advise

## 2023-09-22 NOTE — Telephone Encounter (Signed)
Pt scheduled for follow up appt with Dr Lorin Picket

## 2023-09-23 NOTE — Procedures (Signed)
[facility]  Polysomnogram Report Part I  Phone: [phone] Fax: [phone]  Patient Name: Kathryn Friedman, Kathryn Friedman. Acquisition Number: [id number]  Date of Birth: 06-27-1956 Acquisition Date: 09/12/2023  Referring Physician: Yevonne Pax, MD     History: The patient is a 67 year old  . Medical History: Excessive daytime sleepiness, anemia, anginal pain, anxiety, arthritis back and knees, asthma, bilateral carotid artery stenosis, blood in stool, chronic diarrhea, COPD, coronary artery disease, clopidogrel, depression, diverticulitis, diverticulosis, dysphagia, dyspnea, dysthythmia, GERD, headache, hypertension, hypertriglyceridemia, ILD, lump in the abdomen, overactive bladder, PSVT, spastic colon, T2DM, tobacco abuse, venous insuffiency of both extremities.  Medications: Ventolin HFA, Eliquis, breztriaerosphere, Wellbutrin, calcium, vitamin D3, Dexcom G7 sensor, vitamin B-12, Cymbalta, Pepcid, HM melatonin, multivitamin, Bactroban, Nicoderm CQ, Nitrostat, nystatin, Inderal LA, Requip, Crestor, semaglutide, Micardis, Desyrel, triamcinolone cream, Trimpex, Gemtesa, Norvasc, Protonix.  Procedure: This routine overnight polysomnogram was performed on the Alice 5 using the standard CPAP protocol. This included 6 channels of EEG, 2 channels of EOG, chin EMG, bilateral anterior tibialis EMG, nasal/oral thermistor, PTAF (nasal pressure transducer), chest and abdominal wall movements, EKG, and pulse oximetry.  Description: The total recording time was 408.0 minutes. The total sleep time was 368.5 minutes. There were a total of 28.5 minutes of wakefulness after sleep onset for a slightly reducedsleep efficiency of 90.3%. The latency to sleep onset was within normal limitsat 11.0 minutes. The R sleep onset latency was prolonged at 306.5 minutes. Sleep parameters, as a percentage of the total sleep time, demonstrated 1.1% of sleep was in N1 sleep, 96.2% N2, 0.0% N3 and 2.7% R sleep. There were a total  of 49 arousals for an arousal index of 8.0 arousals per hour of sleep that was normal.  Overall, there were a total of 6 respiratory events for a respiratory disturbance index, which includes apneas, hypopneas and RERAs (increased respiratory effort) of 1.0 respiratory events per hour of sleep during the pressure titration.  was initiated at 4 cm H2O at lights out, 11:09 p.m. It was titrated in 1 cm increments for occasional respiratory events and snoring to the final pressure of 9 cm H2O. The apnea was controlled at the final pressure and supine, REM sleep was observed.   Additionally, the baseline oxygen saturation during wakefulness was 91%, during NREM sleep averaged 91%, and during REM sleep averaged 92%. The total duration of oxygen < 90% was 15.9 minutes and <80% was 0.5 minutes.  Cardiac monitoring-  significant cardiac rhythm irregularities.   Periodic limb movement monitoring- did not demonstrate periodic limb movements.   Impression: This patient's obstructive sleep apnea demonstrated significant improvement with the utilization of nasal  at 9 cm H2O. Some supine sleep was observed. The patient was unable/unwilling to sleep on her back.   Recommendations: Would recommend utilization of nasal  at 9 cm H2O.      An AirFit F20 mask, size Medium , was used. Chin strap used during study- No . Humidifier used during study- Yes .     Yevonne Pax, MD, [facility] Diplomate ABMS-Pulmonary, Critical Care and Sleep Medicine  Electronically reviewed and digitally signed  [facility] CPAP/BIPAP Polysomnogram Report Part II Phone: [phone] Fax: [phone]  Patient last name Schwendemann Neck Size  16.5  in. Acquisition [id number]  Patient first name Kathryn Friedman. Weight 198.0 lbs. Started 09/12/2023 at 11:03:53 PM  Birth date 1956-01-07 Height 64.0 in. Stopped 09/13/2023 at 6:02:11 AM  Age 67      Type Adult  BMI 34.0 lb/in2 Duration 408.0  [name]. RPSGT. / Loura Back     Reviewed by: [name], PhD, ABSM, FAASM Sleep Data: Lights Out: 11:09:53 PM Sleep Onset: 11:20:53 PM  Lights On: 5:57:53 AM Sleep Efficiency: 90.3 %  Total Recording Time: 408.0 min Sleep Latency (from Lights Off) 11.0 min  Total Sleep Time (TST): 368.5 min R Latency (from Sleep Onset): 306.5 min  Sleep Period Time: 397.0 min Total number of awakenings: 14  Wake during sleep: 28.5 min Wake After Sleep Onset (WASO): 28.5 min   Sleep Data:         Arousal Summary: Stage  Latency from lights out (min) Latency from sleep onset (min) Duration (min) % Total Sleep Time  Normal values  N 1 11.0 0.0 4.0 1.1 (5%)  N 2 11.5 0.5 354.5 96.2 (50%)  N 3       0.0 0.0 (20%)  R 317.5 306.5 10.0 2.7 (25%)   Number Index  Spontaneous 50 8.1  Apneas & Hypopneas 1 0.2  RERAs 0 0.0       (Apneas & Hypopneas & RERAs)  (1) (0.2)  Limb Movement 0 0.0  Snore 0 0.0  TOTAL 51 8.3     Respiratory Data:  CA OA MA Apnea Hypopnea* A+ H RERA Total  Number 0 0 0 0 6 6 0 6  Mean Dur (sec) 0.0 0.0 0.0 0.0 11.7 11.7 0.0 11.7  Max Dur (sec) 0.0 0.0 0.0 0.0 12.5 12.5 0.0 12.5  Total Dur (min) 0.0 0.0 0.0 0.0 1.2 1.2 0.0 1.2  % of TST 0.0 0.0 0.0 0.0 0.3 0.3 0.0 0.3  Index (#/h TST) 0.0 0.0 0.0 0.0 1.0 1.0 0.0 1.0  *Hypopneas scored based on 4% or greater desaturation.  Sleep Stage:         REM NREM TST  AHI 6.0 0.8 1.0  RDI 6.0 0.8 1.0   Sleep (min) TST (%) REM (min) NREM (min) CA (#) OA (#) MA (#) HYP (#) AHI (#/h) RERA (#) RDI (#/h) Desat (#)  Supine 8.4 2.28 0.0 8.4 0 0 0 0 0.0 0 0.00 0  Non-Supine 360.10 97.72 10.00 350.10 0.00 0.00 0.00 6.00 1.00 0 1.00 10.00  Left: 244.6 66.38 10.0 234.6 0 0 0 6 1.5 0 1.5 8  Right: 115.5 31.34 0.0 115.5 0 0 0 0 0.0 0 0.00 2     Snoring: Total number of snoring episodes  0  Total time with snoring    min (   % of sleep)   Oximetry Distribution:             WK REM NREM TOTAL  Average (%)   91 92 91 91  < 90% 4.1 0.2 11.6 15.9  < 80% 0.5  0.0 0.0 0.5  < 70% 0.5 0.0 0.0 0.5  # of Desaturations* 1 1 8 10   Desat Index (#/hour) 1.8 6.0 1.3 1.6  Desat Max (%) 4 7 6 7   Desat Max Dur (sec) 15.0 35.0 53.0 53.0  Approx Min O2 during sleep 87  Approx min O2 during a respiratory event 86  Was Oxygen added (Y/N) and final rate :    LPM  *Desaturations based on 4% or greater drop from baseline.   Cheyne Stokes Breathing: None Present   Heart Rate Summary:  Average Heart Rate During Sleep 65.7 bpm      Highest Heart Rate During Sleep (95th %) 72.0 bpm      Highest  Heart Rate During Sleep 107 bpm      Highest Heart Rate During Recording (TIB) 199 bpm (artifact)   Heart Rate Observations: Event Type # Events   Bradycardia 0 Lowest HR Scored: N/A  Sinus Tachycardia During Sleep 0 Highest HR Scored: N/A  Narrow Complex Tachycardia 0 Highest HR Scored: N/A  Wide Complex Tachycardia 0 Highest HR Scored: N/A  Asystole 0 Longest Pause: N/A  Atrial Fibrillation 0 Duration Longest Event: N/A  Other Arrythmias   Type:   Periodic Limb Movement Data: (Primary legs unless otherwise noted) Total # Limb Movement 0 Limb Movement Index 0.0  Total # PLMS    PLMS Index     Total # PLMS Arousals    PLMS Arousal Index     Percentage Sleep Time with PLMS   min (   % sleep)  Mean Duration limb movements (secs)       IPAP Level (cmH2O) EPAP Level (cmH2O) Total Duration (min) Sleep Duration (min) Sleep (%) REM (%) CA  #) OA # MA # HYP #) AHI (#/hr) RERAs # RERAs (#/hr) RDI (#/hr)  4 4 7.6 7.6 100.0 0.0 0 0 0 0 0.0 0 0.0 0.0  5 5 28.0 27.0 96.4 0.0 0 0 0 0 0.0 0 0.0 0.0  6 6 149.3 131.2 87.9 0.0 0 0 0 0 0.0 0 0.0 0.0  7 7 42.9 42.4 98.8 0.0 0 0 0 3 4.2 0 0.0 4.2  8 8  76.7 76.2 99.3 7.4 0 0 0 2 1.6 0 0.0 1.6  9 9  84.4 82.4 97.6 4.6 0 0 0 1 0.7 0 0.0 0.7

## 2023-09-23 NOTE — Telephone Encounter (Signed)
Spoke with patient and reviewed Sales promotion account executive for [employer]. She is going to call them to see what they offer and will call us back if she would like to switch to that medication.   Mariah Milling, Tollie Pizza, MD  You; Jani Gravel, RN6 hours ago (8:15 AM)    Perhaps she can do the [place] select for Xarelto  go straight to the company and pay reduced price there? TG

## 2023-09-23 NOTE — Telephone Encounter (Signed)
Left voicemail message to call back for review of information in regards to her medication.

## 2023-09-23 NOTE — Telephone Encounter (Signed)
Patient was returning phone call 

## 2023-09-24 ENCOUNTER — Telehealth: Payer: Self-pay

## 2023-09-24 ENCOUNTER — Telehealth: Payer: Medicare Other | Admitting: Internal Medicine

## 2023-09-24 ENCOUNTER — Encounter: Payer: Self-pay | Admitting: Internal Medicine

## 2023-09-24 NOTE — Telephone Encounter (Signed)
Lvm for pt to give office a call back. Pt is scheduled for a virtual visit at 11am with Dr. Lorin Picket made 2 attempts to reach pt via phone. Also sent pt the direct link for virtual. [employer]CH[employer] MSG ALSO SENT

## 2023-09-24 NOTE — Progress Notes (Signed)
Patient ID: Kathryn Friedman, female   DOB: 18-Apr-1956, 67 y.o.   MRN: [id number] Did not show for appt.

## 2023-09-24 NOTE — Progress Notes (Deleted)
Patient ID: Kathryn Friedman, female   DOB: 01/06/1956, 67 y.o.   MRN: [id number]   Virtual Visit via *** Note  I connected withNAME@ today at 11:00 AM EDT by a video enabled telemedicine application or telephone and verified that I am speaking with the correct person using two identifiers. Location patient: home Location provider: work or home office Persons participating in the virtual visit: patient, provider  I discussed the limitations, risks, security and privacy concerns of performing an evaluation and management service by telephone and the availability of in person appointments. I also discussed with the patient that there may be a patient responsible charge related to this service. The patient expressed understanding and agreed to proceed.  Interactive audio and video telecommunications were attempted between this provider and patient, however failed, due to patient having technical difficulties OR patient did not have access to video capability.  We continued and completed visit with audio only. ***  Reason for visit: scheduled follow up.   HPI: Follow up  Saw Dr Welton Flakes 09/12/23 - f/u sleep study - recommended nassal at 9cm H2O. Saw ortho 09/03/23 - left lateral hip pain. Tylenol.  Also ordered NCS for right arm burning /tingling. Follow up with [facility] 07/14/23.  Wearing compression hose.  Recommended lymphedema pump.     ROS: See pertinent positives and negatives per HPI.  Past Medical History:  Diagnosis Date   Anemia    Anginal pain (HCC)    Anxiety    Arthritis    back and knees   Asthma    Bilateral carotid artery stenosis    Blood in stool    Chronic diarrhea    COPD (chronic obstructive pulmonary disease) (HCC)    Coronary artery disease    a.) 75% pRCA; 3.5 x 28 mm Cypher DES placed on 07/24/2006   Current use of long term anticoagulation    Clopidogrel   Depression    secondary to the death of her husband (died 48)   Diverticulitis    Diverticulosis     Dizzinesses    Dysphagia    Dyspnea    Dysrhythmia    Fatty infiltration of liver    GERD (gastroesophageal reflux disease)    Headache    History of 2019 novel coronavirus disease (COVID-19) 12/09/2020   History of 2019 novel coronavirus disease (COVID-19) 12/20/2020   Hypertension    Hypertriglyceridemia    ILD (interstitial lung disease) (HCC)    Lump in the abdomen    OSA on CPAP    Overactive bladder    PSVT (paroxysmal supraventricular tachycardia)    Spastic colon    T2DM (type 2 diabetes mellitus) (HCC) 05/2008   Tobacco abuse    Venous insufficiency of both lower extremities     Past Surgical History:  Procedure Laterality Date   ABDOMINAL HYSTERECTOMY  with left ovary in place 1996   APPENDECTOMY  1985   gallbladder and Appendix   BREAST BIOPSY Left 10/14/2017   calcs bx, fibrosis giant cell reaction and chronic inflammation, negative for malignancy.    CARPOMETACARPAL (CMC) FUSION OF THUMB Right 07/10/2022   Procedure: CARPOMETACARPAL (CMC) SUSPENSION OF RIGHT THUMB;  Surgeon: Christena Flake, MD;  Location: [facility] ORS;  Service: Orthopedics;  Laterality: Right;   CATARACT EXTRACTION, BILATERAL     CESAREAN SECTION  1984   CHOLECYSTECTOMY  1985   COLONOSCOPY WITH PROPOFOL N/A 09/13/2016   Procedure: COLONOSCOPY WITH PROPOFOL;  Surgeon: Scot Jun, MD;  Location: [facility] ENDOSCOPY;  Service: Endoscopy;  Laterality: N/A;   COLONOSCOPY WITH PROPOFOL N/A 11/09/2018   Procedure: COLONOSCOPY WITH PROPOFOL;  Surgeon: Scot Jun, MD;  Location: [facility] ENDOSCOPY;  Service: Endoscopy;  Laterality: N/A;   COLONOSCOPY WITH PROPOFOL N/A 03/29/2020   Procedure: COLONOSCOPY WITH PROPOFOL;  Surgeon: Earline Mayotte, MD;  Location: [facility] ENDOSCOPY;  Service: Endoscopy;  Laterality: N/A;   CORONARY ANGIOPLASTY WITH STENT PLACEMENT N/A 07/24/2006   75% pRCA; 3.5 x 28 mm Cypher DES placed; Location: [facility]; Surgeons: Rudean Hitt, MD   ESOPHAGOGASTRODUODENOSCOPY (EGD) WITH  PROPOFOL N/A 02/02/2018   Procedure: ESOPHAGOGASTRODUODENOSCOPY (EGD) WITH PROPOFOL;  Surgeon: Scot Jun, MD;  Location: [facility] ENDOSCOPY;  Service: Endoscopy;  Laterality: N/A;   ESOPHAGOGASTRODUODENOSCOPY (EGD) WITH PROPOFOL N/A 03/29/2020   Procedure: ESOPHAGOGASTRODUODENOSCOPY (EGD) WITH PROPOFOL;  Surgeon: Earline Mayotte, MD;  Location: [facility] ENDOSCOPY;  Service: Endoscopy;  Laterality: N/A;   EYE SURGERY     JOINT REPLACEMENT     bilateral knee replacements   KNEE ARTHROSCOPY  Arthroscopic left knee surgery    KNEE SURGERY  status post knee surgey    LEFT HEART CATH AND CORONARY ANGIOGRAPHY Left 05/14/2018   Procedure: LEFT HEART CATH AND CORONARY ANGIOGRAPHY;  Surgeon: Lamar Blinks, MD;  Location: [facility] INVASIVE CV LAB;  Service: Cardiovascular;  Laterality: Left;   REPLACEMENT TOTAL KNEE  ([facility])   SHOULDER SURGERY  shoulder operation secondary to a torn tendon   TOTAL HIP ARTHROPLASTY Left 06/12/2021   Procedure: TOTAL HIP ARTHROPLASTY;  Surgeon: Christena Flake, MD;  Location: [facility] ORS;  Service: Orthopedics;  Laterality: Left;    Family History  Problem Relation Age of Onset   Other Mother        Hit by a fire truck and has had multiple operations on her back , and has history of MVP    Mitral valve prolapse Mother    Lung cancer Mother    Depression Mother    Heart disease Father        myocardial infarction and is status post bypass surgery   Mitral valve prolapse Sister    Bipolar disorder Sister    Hepatitis C Brother    Cirrhosis Brother    Colon cancer Paternal Aunt    Breast cancer Neg Hx    Prostate cancer Neg Hx    Bladder Cancer Neg Hx    Kidney cancer Neg Hx     SOCIAL HX: ***   Current Outpatient Medications:    albuterol (VENTOLIN HFA) 108 (90 Base) MCG/ACT inhaler, INHALE TWO PUFFS FOUR TIMES DAILY, Disp: 8.5 g, Rfl: 11   amLODipine (NORVASC) 5 MG tablet, Take 1 tablet (5 mg total) by mouth daily., Disp: 90 tablet, Rfl: 3   apixaban  (ELIQUIS) 5 MG TABS tablet, Take 1 tablet (5 mg total) by mouth 2 (two) times daily., Disp: 42 tablet, Rfl: 0   Budeson-Glycopyrrol-Formoterol (BREZTRI AEROSPHERE) 160-9-4.8 MCG/ACT AERO, Inhale 2 puffs into the lungs in the morning and at bedtime., Disp: 32.1 g, Rfl: 3   buPROPion (WELLBUTRIN SR) 150 MG 12 hr tablet, Take 1 tablet (150 mg total) by mouth 2 (two) times daily., Disp: 180 tablet, Rfl: 0   CALCIUM PO, Take 600 mg by mouth daily. , Disp: , Rfl:    cholecalciferol (VITAMIN D3) 25 MCG (1000 UNIT) tablet, Take 1,000 Units by mouth daily., Disp: , Rfl:    Continuous Blood Gluc Receiver DEVI, Use as directed to check blood sugars daily, Disp: 1 each, Rfl: 0  Continuous Glucose Sensor (DEXCOM G7 SENSOR) MISC, Apply 1 sensor every 10 days., Disp: 3 each, Rfl: 3   Cyanocobalamin (VITAMIN B-12 PO), Take 2,500 mcg by mouth daily., Disp: , Rfl:    DULoxetine (CYMBALTA) 60 MG capsule, Take 1 capsule (60 mg total) by mouth daily., Disp: 90 capsule, Rfl: 1   famotidine (PEPCID) 40 MG tablet, Take 1 tablet (40 mg total) by mouth daily., Disp: 90 tablet, Rfl: 3   HM MELATONIN PO, 5 mg by Other route at bedtime as needed. patch, Disp: , Rfl:    isosorbide mononitrate (IMDUR) 30 MG 24 hr tablet, Take 1 tablet (30 mg total) by mouth 2 (two) times daily., Disp: 90 tablet, Rfl: 3   Multiple Vitamin (MULTIVITAMIN) tablet, Take 1 tablet by mouth daily., Disp: , Rfl:    mupirocin ointment (BACTROBAN) 2 %, Apply 1 Application topically 2 (two) times daily., Disp: 22 g, Rfl: 0   nitroGLYCERIN (NITROSTAT) 0.4 MG SL tablet, Place 1 tablet (0.4 mg total) under the tongue every 5 (five) minutes as needed for chest pain., Disp: 25 tablet, Rfl: 3   nystatin (MYCOSTATIN/NYSTOP) powder, APPLY TO THE AFFECTED AREA(S) TWICE DAILY, Disp: 30 g, Rfl: 0   pantoprazole (PROTONIX) 40 MG tablet, TAKE 1 TABLET TWICE DAILY BEFORE MEALS, Disp: 180 tablet, Rfl: 3   propranolol ER (INDERAL LA) 80 MG 24 hr capsule, Take 1 capsule (80  mg total) by mouth 2 (two) times daily., Disp: 180 capsule, Rfl: 2   rOPINIRole (REQUIP) 0.5 MG tablet, TAKE 1 TABLET BY MOUTH AT LUNCHTIME AND TAKE 3MG  AT BEDTIME, Disp: 90 tablet, Rfl: 1   rOPINIRole (REQUIP) 3 MG tablet, TAKE ONE TABLET BY MOUTH AT BEDTIME. TAKE ALONG WITH 0.5MG  DAILY FOR A TOTAL DOSE OF 3.5MG  DAILY, Disp: 90 tablet, Rfl: 1   rosuvastatin (CRESTOR) 20 MG tablet, Take 1 tablet (20 mg total) by mouth daily., Disp: 90 tablet, Rfl: 10   Semaglutide, 1 MG/DOSE, 4 MG/3ML SOPN, Inject 1 mg as directed once a week., Disp: 3 mL, Rfl: 0   telmisartan (MICARDIS) 80 MG tablet, Take 1 tablet (80 mg total) by mouth daily., Disp: 90 tablet, Rfl: 1   traZODone (DESYREL) 100 MG tablet, TAKE ONE AND ONE-HALF TO TWO TABLETS BY MOUTH AT BEDTIME, Disp: 180 tablet, Rfl: 1   triamcinolone cream (KENALOG) 0.1 %, Apply 1 Application topically 2 (two) times daily., Disp: 30 g, Rfl: 0   trimethoprim (TRIMPEX) 100 MG tablet, Take 1 tablet (100 mg total) by mouth daily., Disp: 90 tablet, Rfl: 3   Vibegron (GEMTESA) 75 MG TABS, Take 1 tablet (75 mg total) by mouth daily., Disp: 90 tablet, Rfl: 3  EXAM:  VITALS per patient if applicable:  GENERAL: alert, oriented, appears well and in no acute distress  HEENT: atraumatic, conjunttiva clear, no obvious abnormalities on inspection of external nose and ears  NECK: normal movements of the head and neck  LUNGS: on inspection no signs of respiratory distress, breathing rate appears normal, no obvious gross SOB, gasping or wheezing  CV: no obvious cyanosis  MS: moves all visible extremities without noticeable abnormality  PSYCH/NEURO: pleasant and cooperative, no obvious depression or anxiety, speech and thought processing grossly intact  ASSESSMENT AND PLAN:  Discussed the following assessment and plan:  Problem List Items Addressed This Visit   None   No follow-ups on file.   I discussed the assessment and treatment plan with the patient. The  patient was provided an opportunity to ask questions and  all were answered. The patient agreed with the plan and demonstrated an understanding of the instructions.   The patient was advised to call back or seek an in-person evaluation if the symptoms worsen or if the condition fails to improve as anticipated.  I provided *** minutes of non-face-to-face time during this encounter.   Dale [place], MD

## 2023-09-26 ENCOUNTER — Telehealth (INDEPENDENT_AMBULATORY_CARE_PROVIDER_SITE_OTHER): Payer: Medicare Other | Admitting: Internal Medicine

## 2023-09-26 ENCOUNTER — Encounter: Payer: Self-pay | Admitting: Internal Medicine

## 2023-09-26 VITALS — BP 125/68 | Temp 98.3°F | Ht 64.0 in | Wt 188.0 lb

## 2023-09-26 DIAGNOSIS — I700 Atherosclerosis of aorta: Secondary | ICD-10-CM

## 2023-09-26 DIAGNOSIS — E1159 Type 2 diabetes mellitus with other circulatory complications: Secondary | ICD-10-CM

## 2023-09-26 DIAGNOSIS — E7800 Pure hypercholesterolemia, unspecified: Secondary | ICD-10-CM

## 2023-09-26 DIAGNOSIS — F330 Major depressive disorder, recurrent, mild: Secondary | ICD-10-CM

## 2023-09-26 DIAGNOSIS — F17200 Nicotine dependence, unspecified, uncomplicated: Secondary | ICD-10-CM

## 2023-09-26 DIAGNOSIS — I779 Disorder of arteries and arterioles, unspecified: Secondary | ICD-10-CM | POA: Diagnosis not present

## 2023-09-26 DIAGNOSIS — K219 Gastro-esophageal reflux disease without esophagitis: Secondary | ICD-10-CM

## 2023-09-26 DIAGNOSIS — J449 Chronic obstructive pulmonary disease, unspecified: Secondary | ICD-10-CM

## 2023-09-26 DIAGNOSIS — I10 Essential (primary) hypertension: Secondary | ICD-10-CM

## 2023-09-26 DIAGNOSIS — I2510 Atherosclerotic heart disease of native coronary artery without angina pectoris: Secondary | ICD-10-CM

## 2023-09-26 DIAGNOSIS — I480 Paroxysmal atrial fibrillation: Secondary | ICD-10-CM

## 2023-09-26 DIAGNOSIS — I890 Lymphedema, not elsewhere classified: Secondary | ICD-10-CM

## 2023-09-26 DIAGNOSIS — J849 Interstitial pulmonary disease, unspecified: Secondary | ICD-10-CM

## 2023-09-26 DIAGNOSIS — G4733 Obstructive sleep apnea (adult) (pediatric): Secondary | ICD-10-CM

## 2023-09-26 NOTE — Progress Notes (Unsigned)
Patient ID: Kathryn Friedman, female   DOB: Apr 06, 1956, 67 y.o.   MRN: [id number]   Virtual Visit via video Note  I connected with Kathryn Friedman today by a video enabled telemedicine application and verified that I am speaking with the correct person using two identifiers. Location patient: home Location provider: work  Persons participating in the virtual visit: patient, provider  The limitations, risks, security and privacy concerns of performing an evaluation and management service by video and the availability of in person appointments have been discussed.  It has also been discussed with the patient that there may be a patient responsible charge related to this service. The patient expressed understanding and agreed to proceed.   Reason for visit: follow up appt  HPI: Follow up regarding diabetes and hypertension.  Seeing ortho - right shoulder pain and greater trochanteric bursitis of left hip.  PT - dry needling.  Plans to f/u with ortho.  No chest pain reported. Followed by cardiology - CAD and afib.  Unable to afford eliquis.  Has samples.  Planning to change to xarelto. Breathing overall stable. Seeing psychiatry/counselor. Stable. Blood sugars have been lower.  Has had some low sugars - mostly through the night. Reports decreased appetite and decreased po intake. Has not eaten today.  Currently on 1mg  ozempic.  Discussed decreasing to .5mg  q day - given above symptoms and low sugars.    ROS: See pertinent positives and negatives per HPI.  Past Medical History:  Diagnosis Date   Anemia    Anginal pain (HCC)    Anxiety    Arthritis    back and knees   Asthma    Bilateral carotid artery stenosis    Blood in stool    Chronic diarrhea    COPD (chronic obstructive pulmonary disease) (HCC)    Coronary artery disease    a.) 75% pRCA; 3.5 x 28 mm Cypher DES placed on 07/24/2006   Current use of long term anticoagulation    Clopidogrel   Depression    secondary to the death of  her husband (died 12)   Diverticulitis    Diverticulosis    Dizzinesses    Dysphagia    Dyspnea    Dysrhythmia    Fatty infiltration of liver    GERD (gastroesophageal reflux disease)    Headache    History of 2019 novel coronavirus disease (COVID-19) 12/09/2020   History of 2019 novel coronavirus disease (COVID-19) 12/20/2020   Hypertension    Hypertriglyceridemia    ILD (interstitial lung disease) (HCC)    Lump in the abdomen    OSA on CPAP    Overactive bladder    PSVT (paroxysmal supraventricular tachycardia) (HCC)    Spastic colon    T2DM (type 2 diabetes mellitus) (HCC) 05/2008   Tobacco abuse    Venous insufficiency of both lower extremities     Past Surgical History:  Procedure Laterality Date   ABDOMINAL HYSTERECTOMY  with left ovary in place 1996   APPENDECTOMY  1985   gallbladder and Appendix   BREAST BIOPSY Left 10/14/2017   calcs bx, fibrosis giant cell reaction and chronic inflammation, negative for malignancy.    CARPOMETACARPAL (CMC) FUSION OF THUMB Right 07/10/2022   Procedure: CARPOMETACARPAL (CMC) SUSPENSION OF RIGHT THUMB;  Surgeon: Christena Flake, MD;  Location: [facility] ORS;  Service: Orthopedics;  Laterality: Right;   CATARACT EXTRACTION, BILATERAL     CESAREAN SECTION  1984   CHOLECYSTECTOMY  1985   COLONOSCOPY WITH PROPOFOL  N/A 09/13/2016   Procedure: COLONOSCOPY WITH PROPOFOL;  Surgeon: Scot Jun, MD;  Location: [facility] ENDOSCOPY;  Service: Endoscopy;  Laterality: N/A;   COLONOSCOPY WITH PROPOFOL N/A 11/09/2018   Procedure: COLONOSCOPY WITH PROPOFOL;  Surgeon: Scot Jun, MD;  Location: [facility] ENDOSCOPY;  Service: Endoscopy;  Laterality: N/A;   COLONOSCOPY WITH PROPOFOL N/A 03/29/2020   Procedure: COLONOSCOPY WITH PROPOFOL;  Surgeon: Earline Mayotte, MD;  Location: [facility] ENDOSCOPY;  Service: Endoscopy;  Laterality: N/A;   CORONARY ANGIOPLASTY WITH STENT PLACEMENT N/A 07/24/2006   75% pRCA; 3.5 x 28 mm Cypher DES placed; Location: [facility];  Surgeons: Rudean Hitt, MD   ESOPHAGOGASTRODUODENOSCOPY (EGD) WITH PROPOFOL N/A 02/02/2018   Procedure: ESOPHAGOGASTRODUODENOSCOPY (EGD) WITH PROPOFOL;  Surgeon: Scot Jun, MD;  Location: [facility] ENDOSCOPY;  Service: Endoscopy;  Laterality: N/A;   ESOPHAGOGASTRODUODENOSCOPY (EGD) WITH PROPOFOL N/A 03/29/2020   Procedure: ESOPHAGOGASTRODUODENOSCOPY (EGD) WITH PROPOFOL;  Surgeon: Earline Mayotte, MD;  Location: [facility] ENDOSCOPY;  Service: Endoscopy;  Laterality: N/A;   EYE SURGERY     JOINT REPLACEMENT     bilateral knee replacements   KNEE ARTHROSCOPY  Arthroscopic left knee surgery    KNEE SURGERY  status post knee surgey    LEFT HEART CATH AND CORONARY ANGIOGRAPHY Left 05/14/2018   Procedure: LEFT HEART CATH AND CORONARY ANGIOGRAPHY;  Surgeon: Lamar Blinks, MD;  Location: [facility] INVASIVE CV LAB;  Service: Cardiovascular;  Laterality: Left;   REPLACEMENT TOTAL KNEE  ([facility])   SHOULDER SURGERY  shoulder operation secondary to a torn tendon   TOTAL HIP ARTHROPLASTY Left 06/12/2021   Procedure: TOTAL HIP ARTHROPLASTY;  Surgeon: Christena Flake, MD;  Location: [facility] ORS;  Service: Orthopedics;  Laterality: Left;    Family History  Problem Relation Age of Onset   Other Mother        Hit by a fire truck and has had multiple operations on her back , and has history of MVP    Mitral valve prolapse Mother    Lung cancer Mother    Depression Mother    Heart disease Father        myocardial infarction and is status post bypass surgery   Mitral valve prolapse Sister    Bipolar disorder Sister    Hepatitis C Brother    Cirrhosis Brother    Colon cancer Paternal Aunt    Breast cancer Neg Hx    Prostate cancer Neg Hx    Bladder Cancer Neg Hx    Kidney cancer Neg Hx     SOCIAL HX: reviewed.    Current Outpatient Medications:    acetaminophen (TYLENOL) 500 MG tablet, Take 500 mg by mouth in the morning, at noon, and at bedtime., Disp: , Rfl:    albuterol (VENTOLIN HFA) 108 (90 Base)  MCG/ACT inhaler, INHALE TWO PUFFS FOUR TIMES DAILY, Disp: 8.5 g, Rfl: 11   amLODipine (NORVASC) 5 MG tablet, Take 1 tablet (5 mg total) by mouth daily., Disp: 90 tablet, Rfl: 3   apixaban (ELIQUIS) 5 MG TABS tablet, Take 1 tablet (5 mg total) by mouth 2 (two) times daily., Disp: 42 tablet, Rfl: 0   Budeson-Glycopyrrol-Formoterol (BREZTRI AEROSPHERE) 160-9-4.8 MCG/ACT AERO, Inhale 2 puffs into the lungs in the morning and at bedtime., Disp: 32.1 g, Rfl: 3   buPROPion (WELLBUTRIN SR) 150 MG 12 hr tablet, Take 1 tablet (150 mg total) by mouth 2 (two) times daily., Disp: 180 tablet, Rfl: 0   CALCIUM PO, Take 600 mg by mouth daily. ,  Disp: , Rfl:    cholecalciferol (VITAMIN D3) 25 MCG (1000 UNIT) tablet, Take 1,000 Units by mouth daily., Disp: , Rfl:    Continuous Blood Gluc Receiver DEVI, Use as directed to check blood sugars daily, Disp: 1 each, Rfl: 0   Continuous Glucose Sensor (DEXCOM G7 SENSOR) MISC, Apply 1 sensor every 10 days., Disp: 3 each, Rfl: 3   Cyanocobalamin (VITAMIN B-12 PO), Take 2,500 mcg by mouth daily., Disp: , Rfl:    DULoxetine (CYMBALTA) 60 MG capsule, Take 1 capsule (60 mg total) by mouth daily., Disp: 90 capsule, Rfl: 1   famotidine (PEPCID) 40 MG tablet, Take 1 tablet (40 mg total) by mouth daily., Disp: 90 tablet, Rfl: 3   HM MELATONIN PO, 5 mg by Other route at bedtime as needed. patch, Disp: , Rfl:    isosorbide mononitrate (IMDUR) 30 MG 24 hr tablet, Take 1 tablet (30 mg total) by mouth 2 (two) times daily., Disp: 90 tablet, Rfl: 3   Multiple Vitamin (MULTIVITAMIN) tablet, Take 1 tablet by mouth daily., Disp: , Rfl:    mupirocin ointment (BACTROBAN) 2 %, Apply 1 Application topically 2 (two) times daily., Disp: 22 g, Rfl: 0   nitroGLYCERIN (NITROSTAT) 0.4 MG SL tablet, Place 1 tablet (0.4 mg total) under the tongue every 5 (five) minutes as needed for chest pain., Disp: 25 tablet, Rfl: 3   nystatin (MYCOSTATIN/NYSTOP) powder, APPLY TO THE AFFECTED AREA(S) TWICE DAILY, Disp:  30 g, Rfl: 0   pantoprazole (PROTONIX) 40 MG tablet, TAKE 1 TABLET TWICE DAILY BEFORE MEALS, Disp: 180 tablet, Rfl: 3   propranolol ER (INDERAL LA) 80 MG 24 hr capsule, Take 1 capsule (80 mg total) by mouth 2 (two) times daily., Disp: 180 capsule, Rfl: 2   rOPINIRole (REQUIP) 0.5 MG tablet, TAKE 1 TABLET BY MOUTH AT LUNCHTIME AND TAKE 3MG  AT BEDTIME, Disp: 90 tablet, Rfl: 1   rOPINIRole (REQUIP) 3 MG tablet, TAKE ONE TABLET BY MOUTH AT BEDTIME. TAKE ALONG WITH 0.5MG  DAILY FOR A TOTAL DOSE OF 3.5MG  DAILY, Disp: 90 tablet, Rfl: 1   rosuvastatin (CRESTOR) 20 MG tablet, Take 1 tablet (20 mg total) by mouth daily., Disp: 90 tablet, Rfl: 10   Semaglutide, 1 MG/DOSE, 4 MG/3ML SOPN, Inject 1 mg as directed once a week., Disp: 3 mL, Rfl: 0   telmisartan (MICARDIS) 80 MG tablet, Take 1 tablet (80 mg total) by mouth daily., Disp: 90 tablet, Rfl: 1   traZODone (DESYREL) 100 MG tablet, TAKE ONE AND ONE-HALF TO TWO TABLETS BY MOUTH AT BEDTIME, Disp: 180 tablet, Rfl: 1   triamcinolone cream (KENALOG) 0.1 %, Apply 1 Application topically 2 (two) times daily., Disp: 30 g, Rfl: 0   trimethoprim (TRIMPEX) 100 MG tablet, Take 1 tablet (100 mg total) by mouth daily., Disp: 90 tablet, Rfl: 3   Vibegron (GEMTESA) 75 MG TABS, Take 1 tablet (75 mg total) by mouth daily., Disp: 90 tablet, Rfl: 3  EXAM:  GENERAL: alert, oriented, appears well and in no acute distress  HEENT: atraumatic, conjunttiva clear, no obvious abnormalities on inspection of external nose and ears  NECK: normal movements of the head and neck  LUNGS: on inspection no signs of respiratory distress, breathing rate appears normal, no obvious gross SOB, gasping or wheezing  CV: no obvious cyanosis  PSYCH/NEURO: pleasant and cooperative, no obvious depression or anxiety, speech and thought processing grossly intact  ASSESSMENT AND PLAN:  Discussed the following assessment and plan:  Problem List Items Addressed This Visit  Aortic  atherosclerosis (HCC) - Primary    Continue lipitor.       CAD (coronary artery disease)    S/p stent placement.  Continue lipitor. Followed by cardiology.  On eliquis.   Planning to change to xarelto.       Carotid artery disease (HCC)    Continue lipitor.        COPD (chronic obstructive pulmonary disease) (HCC)    Sees Dr Meredeth Ide.  Using breztri.  Breathing stable.  Dr Meredeth Ide recommended f/u chest CT in 02/2023 -. Lung-RADS 2S, benign appearance or behavior. Continue annual screening with low-dose chest CT without contrast in 12 months.  Mild diffuse bronchial wall thickening with mild centrilobular and paraseptal emphysema; imaging findings suggestive of underlying COPD.      Diabetes (HCC)     Low carb diet and exercise.  Follow met b and a1c. Continue GLP - 1 agonist, but will decrease dose to .5mg  - given decreased po intake and low sugars. Follow sugars and send in readings.       Essential hypertension, benign     Continue micardis 80mg  q day and amlodipine 5mg  q day.  Dr Mariah Milling increased propranolol. Blood pressures have been under reasonable control. Follow pressures.  Follow metabolic panel. Hold on changing medication.  Follow.        GERD (gastroesophageal reflux disease)    No upper symptoms reported.  On protonix.       Hypercholesterolemia    On crestor.  Low cholesterol diet and exercise.  Follow lipid panel and liver function tests.        ILD (interstitial lung disease) (HCC)    Breztri.  Breathing stable.       Lymphedema    Saw [facility] 06/2023 - compression hose.       MDD (major depressive disorder), recurrent episode, mild (HCC)    Being followed by psychiatry.  On cymbalta.  Overall appears to be doing better.  Follow.       Obstructive sleep apnea    CPAP. Followed by pulmonary. F/u 08/2023 - Would recommend utilization of nasal  at 9 cm H2O.      An AirFit F20 mask, size Medium , was used. Chin strap used during study- No . Humidifier used during  study      Paroxysmal A-fib (HCC)    On eliquis currently.  Cost is an issue.  Planning to change to xarelto. Stable.  Continue f/u with cardiology.       Tobacco dependence    Needs to quit smoking.  Have discussed. Follow.        Return in about 2 months (around 11/26/2023) for follow-up.  schedule fasting labs in 1-2 weeks. .   I discussed the assessment and treatment plan with the patient. The patient was provided an opportunity to ask questions and all were answered. The patient agreed with the plan and demonstrated an understanding of the instructions.   The patient was advised to call back or seek an in-person evaluation if the symptoms worsen or if the condition fails to improve as anticipated.    Dale Hurt, MD

## 2023-09-27 ENCOUNTER — Encounter: Payer: Self-pay | Admitting: Internal Medicine

## 2023-09-27 NOTE — Assessment & Plan Note (Signed)
Breztri.  Breathing stable.  

## 2023-09-27 NOTE — Assessment & Plan Note (Signed)
Sees Dr Meredeth Ide.  Using breztri.  Breathing stable.  Dr Meredeth Ide recommended f/u chest CT in 02/2023 -. Lung-RADS 2S, benign appearance or behavior. Continue annual screening with low-dose chest CT without contrast in 12 months.  Mild diffuse bronchial wall thickening with mild centrilobular and paraseptal emphysema; imaging findings suggestive of underlying COPD.

## 2023-09-27 NOTE — Assessment & Plan Note (Signed)
S/p stent placement.  Continue lipitor. Followed by cardiology.  On eliquis.   Planning to change to xarelto.

## 2023-09-27 NOTE — Assessment & Plan Note (Signed)
Low carb diet and exercise.  Follow met b and a1c. Continue GLP - 1 agonist, but will decrease dose to .5mg  - given decreased po intake and low sugars. Follow sugars and send in readings.

## 2023-09-27 NOTE — Assessment & Plan Note (Signed)
Needs to quit smoking.  Have discussed. Follow.

## 2023-09-27 NOTE — Assessment & Plan Note (Signed)
Continue lipitor  ?

## 2023-09-27 NOTE — Assessment & Plan Note (Signed)
Being followed by psychiatry.  On cymbalta.  Overall appears to be doing better.  Follow.

## 2023-09-27 NOTE — Assessment & Plan Note (Signed)
No upper symptoms reported.  On protonix.   

## 2023-09-27 NOTE — Assessment & Plan Note (Signed)
On eliquis currently.  Cost is an issue.  Planning to change to xarelto. Stable.  Continue f/u with cardiology.

## 2023-09-27 NOTE — Assessment & Plan Note (Signed)
CPAP. Followed by pulmonary. F/u 08/2023 - Would recommend utilization of nasal  at 9 cm H2O.      An AirFit F20 mask, size Medium , was used. Chin strap used during study- No . Humidifier used during study

## 2023-09-27 NOTE — Assessment & Plan Note (Signed)
Continue micardis  q day and amlodipine  q day.  Dr Mariah Milling increased propranolol. Blood pressures have been under reasonable control. Follow pressures.  Follow metabolic panel. Hold on changing medication.  Follow.

## 2023-09-27 NOTE — Assessment & Plan Note (Signed)
On crestor.  Low cholesterol diet and exercise.  Follow lipid panel and liver function tests.

## 2023-09-27 NOTE — Assessment & Plan Note (Signed)
Saw [facility] 06/2023 - compression hose.

## 2023-09-29 ENCOUNTER — Telehealth: Payer: Self-pay

## 2023-09-29 NOTE — Telephone Encounter (Signed)
Dr Lorin Picket does not prescribe genetic testing. Pt did not request this. Dr Lorin Picket will not be signing.

## 2023-09-29 NOTE — Telephone Encounter (Signed)
Barbara Cower called from [employer]  to state he is following up on a test requisition form which needed to be signed by Dr. Dale Ko Olina.  Barbara Cower states he faxed this form to [place] on 09/18/2023 and he has faxed the form to [place] several times since that date.   Barbara Cower states he would like for [place] to please fax the completed form to him at [phone], as soon as possible.

## 2023-09-30 ENCOUNTER — Telehealth: Payer: Self-pay | Admitting: Cardiovascular Disease

## 2023-09-30 ENCOUNTER — Other Ambulatory Visit: Payer: Medicare Other

## 2023-09-30 DIAGNOSIS — Z79899 Other long term (current) drug therapy: Secondary | ICD-10-CM | POA: Diagnosis not present

## 2023-09-30 NOTE — Telephone Encounter (Signed)
Patient dropped off patient assistance form for Xarelto. Placed in nurse's box.

## 2023-09-30 NOTE — Telephone Encounter (Signed)
Application received and pending review.    Placed in pending patient assistance applications

## 2023-10-01 ENCOUNTER — Other Ambulatory Visit: Payer: Self-pay | Admitting: Student

## 2023-10-01 DIAGNOSIS — M4807 Spinal stenosis, lumbosacral region: Secondary | ICD-10-CM

## 2023-10-01 DIAGNOSIS — M5416 Radiculopathy, lumbar region: Secondary | ICD-10-CM

## 2023-10-01 DIAGNOSIS — M47816 Spondylosis without myelopathy or radiculopathy, lumbar region: Secondary | ICD-10-CM

## 2023-10-01 NOTE — Telephone Encounter (Signed)
Kathryn Friedman from [employer]  called about a faxed that was sent to [place] on 09-18-23. I read him the message. He said he will call back again with the patient on the other side. He said the patient did request this genetic testing.

## 2023-10-02 NOTE — Telephone Encounter (Signed)
Spoke with pt to confirm she did not request. She says she did not and did not know what I was talking about. Request for genetic testing will not be signed by MD

## 2023-10-02 NOTE — Telephone Encounter (Signed)
Patient just called and wants to speak with Kathryn Friedman. I read her the message. Patient stated she doesn't know what genetic testing mean. She would like for [name] to call her back. Her number is [phone].

## 2023-10-02 NOTE — Telephone Encounter (Signed)
LM for patient. Dr Lorin Picket does not order genetic testing.

## 2023-10-03 ENCOUNTER — Other Ambulatory Visit: Payer: Medicare Other

## 2023-10-03 ENCOUNTER — Other Ambulatory Visit: Payer: Self-pay

## 2023-10-03 DIAGNOSIS — N3281 Overactive bladder: Secondary | ICD-10-CM

## 2023-10-03 DIAGNOSIS — N3941 Urge incontinence: Secondary | ICD-10-CM

## 2023-10-03 NOTE — Telephone Encounter (Signed)
Application completed and faxed to company 

## 2023-10-06 ENCOUNTER — Other Ambulatory Visit: Payer: Medicare Other

## 2023-10-06 ENCOUNTER — Telehealth: Payer: Self-pay | Admitting: *Deleted

## 2023-10-06 ENCOUNTER — Telehealth: Payer: Self-pay | Admitting: Cardiovascular Disease

## 2023-10-06 ENCOUNTER — Ambulatory Visit: Payer: Medicare Other | Admitting: Internal Medicine

## 2023-10-06 DIAGNOSIS — I480 Paroxysmal atrial fibrillation: Secondary | ICD-10-CM

## 2023-10-06 MED ORDER — APIXABAN 5 MG PO TABS
5.0000 mg | ORAL_TABLET | Freq: Two times a day (BID) | ORAL | 0 refills | Status: DC
Start: 2023-10-06 — End: 2023-10-28

## 2023-10-06 NOTE — Telephone Encounter (Signed)
 Pt made aware samples available for pick up.

## 2023-10-06 NOTE — Telephone Encounter (Signed)
Sample request for Eliquis received. Indication: PAF Last office visit: 12/24/22  Concha Se MD Scr: 0.84 on 05/14/23  Epic Age: 67 Weight: 94.6kg  Based on above findings Eliquis 5mg  twice daily is the appropriate dose.  Okay to provide samples if available.

## 2023-10-06 NOTE — Telephone Encounter (Signed)
Patient is on eliquis but filled out application for xarelto. I do not see where she has been on that medication. Just wanted to clarify which she should be taking.

## 2023-10-06 NOTE — Telephone Encounter (Signed)
Patient calling the office for samples of medication:   1.  What medication and dosage are you requesting samples for?   apixaban (ELIQUIS) 5 MG TABS tablet    2.  Are you currently out of this medication? No - Has enough for this week only

## 2023-10-07 ENCOUNTER — Encounter: Payer: Self-pay | Admitting: Student

## 2023-10-07 ENCOUNTER — Other Ambulatory Visit: Payer: Medicare Other

## 2023-10-07 DIAGNOSIS — N3941 Urge incontinence: Secondary | ICD-10-CM | POA: Diagnosis not present

## 2023-10-07 DIAGNOSIS — N3281 Overactive bladder: Secondary | ICD-10-CM | POA: Diagnosis not present

## 2023-10-07 LAB — URINALYSIS, COMPLETE
Bilirubin, UA: NEGATIVE
Glucose, UA: NEGATIVE
Ketones, UA: NEGATIVE
Nitrite, UA: POSITIVE — AB
Protein,UA: NEGATIVE
RBC, UA: NEGATIVE
Specific Gravity, UA: >1.03 — ABNORMAL HIGH (ref 1.005–1.030)
Urobilinogen, Ur: 0.2 mg/dL (ref 0.2–1.0)
pH, UA: 6 (ref 5.0–7.5)

## 2023-10-07 LAB — MICROSCOPIC EXAMINATION

## 2023-10-08 ENCOUNTER — Ambulatory Visit: Payer: Medicare Other

## 2023-10-09 ENCOUNTER — Other Ambulatory Visit: Payer: Self-pay | Admitting: Internal Medicine

## 2023-10-09 DIAGNOSIS — J449 Chronic obstructive pulmonary disease, unspecified: Secondary | ICD-10-CM

## 2023-10-10 ENCOUNTER — Telehealth: Payer: Self-pay | Admitting: *Deleted

## 2023-10-10 LAB — CULTURE, URINE COMPREHENSIVE

## 2023-10-10 LAB — PULMONARY FUNCTION TEST

## 2023-10-10 NOTE — Telephone Encounter (Signed)
.  left message to have patient return my call.  

## 2023-10-10 NOTE — Telephone Encounter (Signed)
-----   Message from [place] [facility] sent at 10/10/2023  2:10 PM EST ----- Macrodantin 100 mg bid for 7 days ----- Message ----- From: Interface, [facility] Lab Results In Sent: 10/07/2023   4:36 PM EST To: Alfredo Martinez, MD

## 2023-10-10 NOTE — Telephone Encounter (Signed)
Rx ok'd for albuterol inhaler with one refill.

## 2023-10-13 ENCOUNTER — Ambulatory Visit: Payer: Medicare Other

## 2023-10-13 ENCOUNTER — Encounter: Payer: Self-pay | Admitting: Urology

## 2023-10-13 ENCOUNTER — Ambulatory Visit: Payer: Medicare Other | Admitting: Psychiatry

## 2023-10-13 MED ORDER — NITROFURANTOIN MONOHYD MACRO 100 MG PO CAPS: 100.0000 mg | ORAL_CAPSULE | Freq: Two times a day (BID) | ORAL | 0 refills | Status: AC

## 2023-10-13 NOTE — Telephone Encounter (Signed)
See my chart encounter.

## 2023-10-14 NOTE — Telephone Encounter (Signed)
Application completed and faxed.   Antonieta Iba, MD   Message  Not get Eliquis patient assistance  So trying Xarelto 20 mg daily  Thx  TGollan

## 2023-10-15 DIAGNOSIS — Z79899 Other long term (current) drug therapy: Secondary | ICD-10-CM | POA: Diagnosis not present

## 2023-10-16 ENCOUNTER — Telehealth: Payer: Self-pay | Admitting: Cardiovascular Disease

## 2023-10-16 NOTE — Telephone Encounter (Signed)
Patient is requesting call back from [place], [place] in regards to Xarelto. She states she received a letter in regards  to patient assistance.

## 2023-10-16 NOTE — Telephone Encounter (Signed)
Left voicemail message to call back  

## 2023-10-16 NOTE — Telephone Encounter (Signed)
Spoke with patient and reviewed that all information was resubmitted and we should hear something from them soon. She inquired about samples and we do not currently have any on hand. Advised that I had re faxed application on 10/14/23 and now just waiting for consideration. She verbalized understanding with no further questions at this time.

## 2023-10-16 NOTE — Telephone Encounter (Signed)
Patient was returning call. Please advise ?

## 2023-10-20 ENCOUNTER — Encounter: Payer: Self-pay | Admitting: Urology

## 2023-10-20 ENCOUNTER — Other Ambulatory Visit: Payer: Medicare Other

## 2023-10-20 ENCOUNTER — Ambulatory Visit (INDEPENDENT_AMBULATORY_CARE_PROVIDER_SITE_OTHER): Payer: Medicare Other | Admitting: Urology

## 2023-10-20 VITALS — BP 132/68 | HR 97 | Ht 64.0 in | Wt 188.0 lb

## 2023-10-20 DIAGNOSIS — N3281 Overactive bladder: Secondary | ICD-10-CM

## 2023-10-20 DIAGNOSIS — N3941 Urge incontinence: Secondary | ICD-10-CM

## 2023-10-20 MED ORDER — ONABOTULINUMTOXINA 100 UNITS IJ SOLR
100.0000 [IU] | Freq: Once | INTRAMUSCULAR | Status: AC
  Administered 2023-10-20: 100 [IU] via INTRAMUSCULAR

## 2023-10-20 NOTE — Progress Notes (Signed)
10/20/2023 10:43 AM   Kathryn Friedman 10/30/1956 [id number]  Referring provider: Dale Haubstadt, MD [address] [place],  [place] [address]  No chief complaint on file.   HPI: Reviewed note.  Patient's last Botox was October 21, 2022.  Last culture negative.  She  did exceptionally well.  Low postvoid residual afterwards   No great results until 3 weeks ago.  Clinically not infected.  She took her antibiotic   Cystoscopy: Patient underwent flexible cystoscopy.  Bladder mucosa and trigone were normal.  No cystitis.  She was injected with Botox 100 units with 10 cc of normal saline.  10 injections were utilized with 1 cc/inj.  It was primarily lower third of bladder and midline with my modified template.  She tolerated it well.  She will be followed as per protocol Patient's last Botox was May 2024.  When seen postprocedure it was working well but she was straining some to void  Clinically not infected.  Urge incontinence much improved for a few months but now leaking back to baseline.  Frequency stable.  Cystoscopy: Patient underwent flexible cystoscopy.  Bladder mucosa and trigone were normal.  No cystitis.  No carcinoma.  I injected 100 units of Botox and 10 cc of normal saline with my modified template.  She tolerated very well.  No bleeding.        PMH: Past Medical History:  Diagnosis Date   Anemia    Anginal pain (HCC)    Anxiety    Arthritis    back and knees   Asthma    Bilateral carotid artery stenosis    Blood in stool    Chronic diarrhea    COPD (chronic obstructive pulmonary disease) (HCC)    Coronary artery disease    a.) 75% pRCA; 3.5 x 28 mm Cypher DES placed on 07/24/2006   Current use of long term anticoagulation    Clopidogrel   Depression    secondary to the death of her husband (died 66)   Diverticulitis    Diverticulosis    Dizzinesses    Dysphagia    Dyspnea    Dysrhythmia    Fatty infiltration of liver    GERD  (gastroesophageal reflux disease)    Headache    History of 2019 novel coronavirus disease (COVID-19) 12/09/2020   History of 2019 novel coronavirus disease (COVID-19) 12/20/2020   Hypertension    Hypertriglyceridemia    ILD (interstitial lung disease) (HCC)    Lump in the abdomen    OSA on CPAP    Overactive bladder    PSVT (paroxysmal supraventricular tachycardia) (HCC)    Spastic colon    T2DM (type 2 diabetes mellitus) (HCC) 05/2008   Tobacco abuse    Venous insufficiency of both lower extremities     Surgical History: Past Surgical History:  Procedure Laterality Date   ABDOMINAL HYSTERECTOMY  with left ovary in place 1996   APPENDECTOMY  1985   gallbladder and Appendix   BREAST BIOPSY Left 10/14/2017   calcs bx, fibrosis giant cell reaction and chronic inflammation, negative for malignancy.    CARPOMETACARPAL (CMC) FUSION OF THUMB Right 07/10/2022   Procedure: CARPOMETACARPAL (CMC) SUSPENSION OF RIGHT THUMB;  Surgeon: Christena Flake, MD;  Location: [facility] ORS;  Service: Orthopedics;  Laterality: Right;   CATARACT EXTRACTION, BILATERAL     CESAREAN SECTION  1984   CHOLECYSTECTOMY  1985   COLONOSCOPY WITH PROPOFOL N/A 09/13/2016   Procedure: COLONOSCOPY WITH PROPOFOL;  Surgeon:  Scot Jun, MD;  Location: [facility] ENDOSCOPY;  Service: Endoscopy;  Laterality: N/A;   COLONOSCOPY WITH PROPOFOL N/A 11/09/2018   Procedure: COLONOSCOPY WITH PROPOFOL;  Surgeon: Scot Jun, MD;  Location: [facility] ENDOSCOPY;  Service: Endoscopy;  Laterality: N/A;   COLONOSCOPY WITH PROPOFOL N/A 03/29/2020   Procedure: COLONOSCOPY WITH PROPOFOL;  Surgeon: Earline Mayotte, MD;  Location: [facility] ENDOSCOPY;  Service: Endoscopy;  Laterality: N/A;   CORONARY ANGIOPLASTY WITH STENT PLACEMENT N/A 07/24/2006   75% pRCA; 3.5 x 28 mm Cypher DES placed; Location: [facility]; Surgeons: Rudean Hitt, MD   ESOPHAGOGASTRODUODENOSCOPY (EGD) WITH PROPOFOL N/A 02/02/2018   Procedure: ESOPHAGOGASTRODUODENOSCOPY (EGD)  WITH PROPOFOL;  Surgeon: Scot Jun, MD;  Location: [facility] ENDOSCOPY;  Service: Endoscopy;  Laterality: N/A;   ESOPHAGOGASTRODUODENOSCOPY (EGD) WITH PROPOFOL N/A 03/29/2020   Procedure: ESOPHAGOGASTRODUODENOSCOPY (EGD) WITH PROPOFOL;  Surgeon: Earline Mayotte, MD;  Location: [facility] ENDOSCOPY;  Service: Endoscopy;  Laterality: N/A;   EYE SURGERY     JOINT REPLACEMENT     bilateral knee replacements   KNEE ARTHROSCOPY  Arthroscopic left knee surgery    KNEE SURGERY  status post knee surgey    LEFT HEART CATH AND CORONARY ANGIOGRAPHY Left 05/14/2018   Procedure: LEFT HEART CATH AND CORONARY ANGIOGRAPHY;  Surgeon: Lamar Blinks, MD;  Location: [facility] INVASIVE CV LAB;  Service: Cardiovascular;  Laterality: Left;   REPLACEMENT TOTAL KNEE  ([facility])   SHOULDER SURGERY  shoulder operation secondary to a torn tendon   TOTAL HIP ARTHROPLASTY Left 06/12/2021   Procedure: TOTAL HIP ARTHROPLASTY;  Surgeon: Christena Flake, MD;  Location: [facility] ORS;  Service: Orthopedics;  Laterality: Left;    Home Medications:  Allergies as of 10/20/2023       Reactions   Varenicline Other (See Comments)   "I got really depressed" "I got really depressed" CHANTEX   Varenicline Tartrate    Jardiance [empagliflozin] Other (See Comments)   Yeast infection   Metformin And Related Other (See Comments)   Diarrhea, even with XR   Methylprednisolone Nausea And Vomiting, Nausea Only   As per pt she is not allergic         Medication List        Accurate as of October 20, 2023 10:43 AM. If you have any questions, ask your nurse or doctor.          acetaminophen 500 MG tablet Commonly known as: TYLENOL Take 500 mg by mouth in the morning, at noon, and at bedtime.   albuterol 108 (90 Base) MCG/ACT inhaler Commonly known as: VENTOLIN HFA INHALE TWO PUFFS FOUR TIMES DAILY   amLODipine 5 MG tablet Commonly known as: NORVASC Take 1 tablet (5 mg total) by mouth daily.   apixaban 5 MG Tabs tablet Commonly  known as: Eliquis Take 1 tablet (5 mg total) by mouth 2 (two) times daily.   Breztri Aerosphere 160-9-4.8 MCG/ACT Aero Generic drug: Budeson-Glycopyrrol-Formoterol Inhale 2 puffs into the lungs in the morning and at bedtime.   buPROPion 150 MG 12 hr tablet Commonly known as: Wellbutrin SR Take 1 tablet (150 mg total) by mouth 2 (two) times daily.   CALCIUM PO Take 600 mg by mouth daily.   cholecalciferol 25 MCG (1000 UNIT) tablet Commonly known as: VITAMIN D3 Take 1,000 Units by mouth daily.   Continuous Blood Gluc Receiver Devi Use as directed to check blood sugars daily   Dexcom G7 Sensor Misc Apply 1 sensor every 10 days.   DULoxetine 60 MG capsule  Commonly known as: CYMBALTA Take 1 capsule (60 mg total) by mouth daily.   famotidine 40 MG tablet Commonly known as: PEPCID Take 1 tablet (40 mg total) by mouth daily.   Gemtesa 75 MG Tabs Generic drug: Vibegron Take 1 tablet (75 mg total) by mouth daily.   HM MELATONIN PO 5 mg by Other route at bedtime as needed. patch   isosorbide mononitrate 30 MG 24 hr tablet Commonly known as: IMDUR Take 1 tablet (30 mg total) by mouth 2 (two) times daily.   multivitamin tablet Take 1 tablet by mouth daily.   mupirocin ointment 2 % Commonly known as: BACTROBAN Apply 1 Application topically 2 (two) times daily.   nitrofurantoin (macrocrystal-monohydrate) 100 MG capsule Commonly known as: Macrobid Take 1 capsule (100 mg total) by mouth 2 (two) times daily for 7 days.   nitroGLYCERIN 0.4 MG SL tablet Commonly known as: NITROSTAT Place 1 tablet (0.4 mg total) under the tongue every 5 (five) minutes as needed for chest pain.   nystatin powder Commonly known as: MYCOSTATIN/NYSTOP APPLY TO THE AFFECTED AREA(S) TWICE DAILY   pantoprazole 40 MG tablet Commonly known as: PROTONIX TAKE 1 TABLET TWICE DAILY BEFORE MEALS   propranolol ER 80 MG 24 hr capsule Commonly known as: INDERAL LA Take 1 capsule (80 mg total) by mouth 2  (two) times daily.   rOPINIRole 0.5 MG tablet Commonly known as: REQUIP TAKE 1 TABLET BY MOUTH AT LUNCHTIME AND TAKE 3MG  AT BEDTIME   rOPINIRole 3 MG tablet Commonly known as: REQUIP TAKE ONE TABLET BY MOUTH AT BEDTIME. TAKE ALONG WITH 0.5MG  DAILY FOR A TOTAL DOSE OF 3.5MG  DAILY   rosuvastatin 20 MG tablet Commonly known as: CRESTOR Take 1 tablet (20 mg total) by mouth daily.   Semaglutide (1 MG/DOSE) 4 MG/3ML Sopn Inject 1 mg as directed once a week.   telmisartan 80 MG tablet Commonly known as: MICARDIS Take 1 tablet (80 mg total) by mouth daily.   traZODone 100 MG tablet Commonly known as: DESYREL TAKE ONE AND ONE-HALF TO TWO TABLETS BY MOUTH AT BEDTIME   triamcinolone cream 0.1 % Commonly known as: KENALOG Apply 1 Application topically 2 (two) times daily.   trimethoprim 100 MG tablet Commonly known as: TRIMPEX Take 1 tablet (100 mg total) by mouth daily.   VITAMIN B-12 PO Take 2,500 mcg by mouth daily.        Allergies:  Allergies  Allergen Reactions   Varenicline Other (See Comments)    "I got really depressed" "I got really depressed" CHANTEX   Varenicline Tartrate    Jardiance [Empagliflozin] Other (See Comments)    Yeast infection   Metformin And Related Other (See Comments)    Diarrhea, even with XR   Methylprednisolone Nausea And Vomiting and Nausea Only    As per pt she is not allergic     Family History: Family History  Problem Relation Age of Onset   Other Mother        Hit by a fire truck and has had multiple operations on her back , and has history of MVP    Mitral valve prolapse Mother    Lung cancer Mother    Depression Mother    Heart disease Father        myocardial infarction and is status post bypass surgery   Mitral valve prolapse Sister    Bipolar disorder Sister    Hepatitis C Brother    Cirrhosis Brother    Colon cancer Paternal  Aunt    Breast cancer Neg Hx    Prostate cancer Neg Hx    Bladder Cancer Neg Hx    Kidney  cancer Neg Hx     Social History:  reports that she has been smoking cigarettes. She has a 90 pack-year smoking history. She has been exposed to tobacco smoke. She has never used smokeless tobacco. She reports that she does not drink alcohol and does not use drugs.  ROS:                                        Physical Exam: There were no vitals taken for this visit.  Constitutional:  Alert and oriented, No acute distress. HEENT: [place] AT, moist mucus membranes.  Trachea midline, no masses. Cardiovascular: No clubbing, cyanosis, or edema.  Laboratory Data: Lab Results  Component Value Date   WBC 7.1 12/20/2022   HGB 14.6 12/20/2022   HCT 42.7 12/20/2022   MCV 99.1 12/20/2022   PLT 144.0 (L) 12/20/2022    Lab Results  Component Value Date   CREATININE 0.84 05/14/2023    No results found for: "PSA"  No results found for: "TESTOSTERONE"  Lab Results  Component Value Date   HGBA1C 6.3 05/14/2023    Urinalysis    Component Value Date/Time   COLORURINE YELLOW 05/23/2022 1445   APPEARANCEUR Cloudy (A) 10/07/2023 1341   LABSPEC <=1.005 (A) 05/23/2022 1445   PHURINE 6.0 05/23/2022 1445   GLUCOSEU Negative 10/07/2023 1341   GLUCOSEU NEGATIVE 05/23/2022 1445   HGBUR NEGATIVE 05/23/2022 1445   BILIRUBINUR Negative 10/07/2023 1341   KETONESUR NEGATIVE 05/23/2022 1445   PROTEINUR Negative 10/07/2023 1341   PROTEINUR NEGATIVE 06/19/2021 1836   UROBILINOGEN 0.2 05/23/2022 1445   NITRITE Positive (A) 10/07/2023 1341   NITRITE NEGATIVE 05/23/2022 1445   LEUKOCYTESUR Trace (A) 10/07/2023 1341   LEUKOCYTESUR NEGATIVE 05/23/2022 1445    Pertinent Imaging:   Assessment & Plan: Follow-up as per protocol and repeat Botox in about 6 months  There are no diagnoses linked to this encounter.  No follow-ups on file.  Martina Sinner, MD  [facility] [address] [place], [place] [address] [phone]

## 2023-10-21 ENCOUNTER — Ambulatory Visit: Payer: Medicare Other | Admitting: Internal Medicine

## 2023-10-27 DIAGNOSIS — Z79899 Other long term (current) drug therapy: Secondary | ICD-10-CM | POA: Diagnosis not present

## 2023-10-28 ENCOUNTER — Telehealth: Payer: Self-pay | Admitting: Cardiovascular Disease

## 2023-10-28 ENCOUNTER — Telehealth: Payer: Self-pay | Admitting: Internal Medicine

## 2023-10-28 DIAGNOSIS — I480 Paroxysmal atrial fibrillation: Secondary | ICD-10-CM

## 2023-10-28 DIAGNOSIS — G4733 Obstructive sleep apnea (adult) (pediatric): Secondary | ICD-10-CM | POA: Diagnosis not present

## 2023-10-28 MED ORDER — APIXABAN 5 MG PO TABS: 5.0000 mg | ORAL_TABLET | Freq: Two times a day (BID) | ORAL | 0 refills | Status: DC

## 2023-10-28 NOTE — Telephone Encounter (Signed)
Patient calling the office for samples of medication:   1.  What medication and dosage are you requesting samples for?  apixaban (ELIQUIS) 5 MG TABS tablet    2.  Are you currently out of this medication? Yes pt in donut hole

## 2023-10-28 NOTE — Telephone Encounter (Signed)
Pt called asking if our office has any samples of apixaban (ELIQUIS) 5 MG TABS tablet? Pt states she's spoke to Trish before about samples of meds. Pt's cardiologist, Dr. Mariah Milling, is the original provider who prescribes meds. Pt has contacted Dr. Windell Hummingbird office for samples as well. Pt mentioned not being able to receive meds anymore due to insurance. Please advise. Call back # [phone]

## 2023-10-28 NOTE — Telephone Encounter (Signed)
Called patient and left a message that samples are ready for pick up.

## 2023-10-29 NOTE — Telephone Encounter (Signed)
Ok

## 2023-10-29 NOTE — Telephone Encounter (Signed)
Called patient to clarify. She is in the donut hole and cannot afford eliquis. Mariah Milling is working on switching her to [employer] because she does qualify for assistance for this medication. Dr Mariah Milling has given her samples but they are running low. Ok to give her samples of eliquis 5 mg?

## 2023-10-30 ENCOUNTER — Encounter: Payer: Self-pay | Admitting: Psychiatry

## 2023-10-30 ENCOUNTER — Telehealth: Payer: Medicare Other | Admitting: Psychiatry

## 2023-10-30 DIAGNOSIS — F3342 Major depressive disorder, recurrent, in full remission: Secondary | ICD-10-CM | POA: Diagnosis not present

## 2023-10-30 DIAGNOSIS — F17200 Nicotine dependence, unspecified, uncomplicated: Secondary | ICD-10-CM

## 2023-10-30 DIAGNOSIS — G4701 Insomnia due to medical condition: Secondary | ICD-10-CM | POA: Diagnosis not present

## 2023-10-30 DIAGNOSIS — F17210 Nicotine dependence, cigarettes, uncomplicated: Secondary | ICD-10-CM

## 2023-10-30 DIAGNOSIS — F418 Other specified anxiety disorders: Secondary | ICD-10-CM | POA: Diagnosis not present

## 2023-10-30 DIAGNOSIS — G2581 Restless legs syndrome: Secondary | ICD-10-CM

## 2023-10-30 MED ORDER — VARENICLINE TARTRATE 0.5 MG PO TABS: 0.5000 mg | ORAL_TABLET | Freq: Two times a day (BID) | ORAL | 1 refills | Status: DC

## 2023-10-30 NOTE — Progress Notes (Signed)
Virtual Visit via Video Note  I connected with Kathryn Friedman on 10/30/23 at  2:00 PM EST by a video enabled telemedicine application and verified that I am speaking with the correct person using two identifiers.  Location Provider Location : [facility] Patient Location : Home  Participants: Patient , Provider    I discussed the limitations of evaluation and management by telemedicine and the availability of in person appointments. The patient expressed understanding and agreed to proceed.   I discussed the assessment and treatment plan with the patient. The patient was provided an opportunity to ask questions and all were answered. The patient agreed with the plan and demonstrated an understanding of the instructions.   The patient was advised to call back or seek an in-person evaluation if the symptoms worsen or if the condition fails to improve as anticipated.   BH MD OP Progress Note  10/31/2023 8:18 AM Kathryn Friedman  MRN:  [id number]  Chief Complaint:  Chief Complaint  Patient presents with   Follow-up   Anxiety   Depression   Medication Refill   HPI: Kathryn Friedman is a 67 year old Caucasian female, widowed, retired, on [employer], lives in [place], has a history of MDD, insomnia, obstructive sleep apnea on CPAP, history of coronary artery disease status post stent placement, COPD, diabetes mellitus, hypertension, GERD, interstitial lung disease, iron deficiency was evaluated by telemedicine today.  The patient, with a history of major depression, anxiety, and sleep problems, presents with recent fall-related injuries. She reports falling at home due to her shoe sticking to the floor, resulting in significant bruising and ongoing pain in her back and hips. The patient has been using a cane for mobility since the incident and expresses fear of falling again. She has sought orthopedic consultation and is awaiting approval for an MRI to further investigate her injuries.  The  patient's depression and anxiety are managed with Cymbalta 60mg  and Trazodone, which she recently reduced from 200 mg to 100 mg. However, due to sleep disturbances, the patient plans to increase the Trazodone dosage to 150 mg.   She discontinued Wellbutrin two weeks ago due to lack of efficacy and denied any withdrawal symptoms. She experienced side effects with Chantix in the past but did not have the same issues during a recent trial.  Patient is not today in retrial of Chantix for smoking cessation.  For her restless leg symptoms the patient is on Requip 3mg  at bedtime and 0.5mg  in the afternoon.  Patient denies side effects.  She is also undergoing therapy and plans to delve deeper into her childhood experiences in upcoming sessions.  Patient denies any suicidality, homicidality or perceptual disturbances.  Patient denies any other concerns today.  Visit Diagnosis:    ICD-10-CM   1. MDD (major depressive disorder), recurrent, in full remission (HCC)  F33.42     2. Insomnia due to medical condition  G47.01    OSA, RLS    3. RLS (restless legs syndrome)  G25.81     4. Other specified anxiety disorders  F41.8    Limited symptom attacks    5. Tobacco use disorder  F17.200 varenicline (CHANTIX) 0.5 MG tablet      Past Psychiatric History: I have reviewed past psychiatric history from progress note on 06/26/2020.  Past trials of trazodone, Cymbalta, Lexapro, Rexulti, doxepin, gabapentin, Lunesta.  Patient was previously under the care of [employer].  Past Medical History:  Past Medical History:  Diagnosis Date   Anemia  Anginal pain (HCC)    Anxiety    Arthritis    back and knees   Asthma    Bilateral carotid artery stenosis    Blood in stool    Chronic diarrhea    COPD (chronic obstructive pulmonary disease) (HCC)    Coronary artery disease    a.) 75% pRCA; 3.5 x 28 mm Cypher DES placed on 07/24/2006   Current use of long term anticoagulation    Clopidogrel    Depression    secondary to the death of her husband (died 11/21/1995)   Diverticulitis    Diverticulosis    Dizzinesses    Dysphagia    Dyspnea    Dysrhythmia    Fatty infiltration of liver    GERD (gastroesophageal reflux disease)    Headache    History of 11/20/18 novel coronavirus disease (COVID-19) 12/09/2020   History of 11/20/2018 novel coronavirus disease (COVID-19) 12/20/2020   Hypertension    Hypertriglyceridemia    ILD (interstitial lung disease) (HCC)    Lump in the abdomen    OSA on CPAP    Overactive bladder    PSVT (paroxysmal supraventricular tachycardia) (HCC)    Spastic colon    T2DM (type 2 diabetes mellitus) (HCC) 05/2008   Tobacco abuse    Venous insufficiency of both lower extremities     Past Surgical History:  Procedure Laterality Date   ABDOMINAL HYSTERECTOMY  with left ovary in place 11-21-95   APPENDECTOMY  1985   gallbladder and Appendix   BREAST BIOPSY Left 10/14/2017   calcs bx, fibrosis giant cell reaction and chronic inflammation, negative for malignancy.    CARPOMETACARPAL (CMC) FUSION OF THUMB Right 07/10/2022   Procedure: CARPOMETACARPAL (CMC) SUSPENSION OF RIGHT THUMB;  Surgeon: Christena Flake, MD;  Location: [facility] ORS;  Service: Orthopedics;  Laterality: Right;   CATARACT EXTRACTION, BILATERAL     CESAREAN SECTION  1984   CHOLECYSTECTOMY  1985   COLONOSCOPY WITH PROPOFOL N/A 09/13/2016   Procedure: COLONOSCOPY WITH PROPOFOL;  Surgeon: Scot Jun, MD;  Location[facility] ENDOSCOPY;  Service: Endoscopy;  Laterality: N/A;   COLONOSCOPY WITH PROPOFOL N/A 11/09/2018   Procedure: COLONOSCOPY WITH PROPOFOL;  Surgeon: Scot Jun, MD;  Location: [facility] ENDOSCOPY;  Service: Endoscopy;  Laterality: N/A;   COLONOSCOPY WITH PROPOFOL N/A 03/29/2020   Procedure: COLONOSCOPY WITH PROPOFOL;  Surgeon: Earline Mayotte, MD;  Location: [facility] ENDOSCOPY;  Service: Endoscopy;  Laterality: N/A;   CORONARY ANGIOPLASTY WITH STENT PLACEMENT N/A 07/24/2006   75% pRCA; 3.5 x 28  mm Cypher DES placed; Location: [facility]; Surgeons: Rudean Hitt, MD   ESOPHAGOGASTRODUODENOSCOPY (EGD) WITH PROPOFOL N/A 02/02/2018   Procedure: ESOPHAGOGASTRODUODENOSCOPY (EGD) WITH PROPOFOL;  Surgeon: Scot Jun, MD;  Location: [facility] ENDOSCOPY;  Service: Endoscopy;  Laterality: N/A;   ESOPHAGOGASTRODUODENOSCOPY (EGD) WITH PROPOFOL N/A 03/29/2020   Procedure: ESOPHAGOGASTRODUODENOSCOPY (EGD) WITH PROPOFOL;  Surgeon: Earline Mayotte, MD;  Location: [facility] ENDOSCOPY;  Service: Endoscopy;  Laterality: N/A;   EYE SURGERY     JOINT REPLACEMENT     bilateral knee replacements   KNEE ARTHROSCOPY  Arthroscopic left knee surgery    KNEE SURGERY  status post knee surgey    LEFT HEART CATH AND CORONARY ANGIOGRAPHY Left 05/14/2018   Procedure: LEFT HEART CATH AND CORONARY ANGIOGRAPHY;  Surgeon: Lamar Blinks, MD;  Location: [facility] INVASIVE CV LAB;  Service: Cardiovascular;  Laterality: Left;   REPLACEMENT TOTAL KNEE  (D[facility])   SHOULDER SURGERY  shoulder operation secondary to a torn  tendon   TOTAL HIP ARTHROPLASTY Left 06/12/2021   Procedure: TOTAL HIP ARTHROPLASTY;  Surgeon: Christena Flake, MD;  Location: [facility] ORS;  Service: Orthopedics;  Laterality: Left;    Family Psychiatric History: I have reviewed family psychiatric history from progress note on 06/26/2020.  Family History:  Family History  Problem Relation Age of Onset   Other Mother        Hit by a fire truck and has had multiple operations on her back , and has history of MVP    Mitral valve prolapse Mother    Lung cancer Mother    Depression Mother    Heart disease Father        myocardial infarction and is status post bypass surgery   Mitral valve prolapse Sister    Bipolar disorder Sister    Hepatitis C Brother    Cirrhosis Brother    Colon cancer Paternal Aunt    Breast cancer Neg Hx    Prostate cancer Neg Hx    Bladder Cancer Neg Hx    Kidney cancer Neg Hx     Social History: I have reviewed social history from  progress note on 06/26/2020. Social History   Socioeconomic History   Marital status: Widowed    Spouse name: Shekema Nieves   Number of children: 1   Years of education: 12   Highest education level: Associate degree: occupational, Scientist, product/process development, or vocational program  Occupational History    Employer: [employer]  Tobacco Use   Smoking status: Every Day    Current packs/day: 2.00    Average packs/day: 2.0 packs/day for 45.0 years (90.0 ttl pk-yrs)    Types: Cigarettes    Passive exposure: Current   Smokeless tobacco: Never   Tobacco comments:    Smokes 18 cigarettes every day 12/24/22  Vaping Use   Vaping status: Former   Substances: Flavoring  Substance and Sexual Activity   Alcohol use: No    Alcohol/week: 0.0 standard drinks of alcohol   Drug use: No   Sexual activity: Not Currently  Other Topics Concern   Not on file  Social History Narrative   Not on file   Social Determinants of Health   Financial Resource Strain: Low Risk  (03/13/2023)   Overall Financial Resource Strain (CARDIA)    Difficulty of Paying Living Expenses: Not very hard  Recent Concern: Financial Resource Strain - Medium Risk (03/11/2023)   Overall Financial Resource Strain (CARDIA)    Difficulty of Paying Living Expenses: Somewhat hard  Food Insecurity: Food Insecurity Present (03/13/2023)   Hunger Vital Sign    Worried About Running Out of Food in the Last Year: Sometimes true    Ran Out of Food in the Last Year: Never true  Transportation Needs: No Transportation Needs (03/13/2023)   PRAPARE - Administrator, Civil Service (Medical): No    Lack of Transportation (Non-Medical): No  Physical Activity: Inactive (03/13/2023)   Exercise Vital Sign    Days of Exercise per Week: 0 days    Minutes of Exercise per Session: 0 min  Stress: Stress Concern Present (03/13/2023)   [employer] of Occupational Health - Occupational Stress Questionnaire    Feeling of Stress : To some extent  Social Connections:  Socially Isolated (03/13/2023)   Social Connection and Isolation Panel [NHANES]    Frequency of Communication with Friends and Family: More than three times a week    Frequency of Social Gatherings with Friends and Family: Twice a week  Attends Religious Services: Never    Active Member of Clubs or Organizations: No    Attends Banker Meetings: Never    Marital Status: Widowed    Allergies:  Allergies  Allergen Reactions   Varenicline Tartrate    Jardiance [Empagliflozin] Other (See Comments)    Yeast infection   Metformin And Related Other (See Comments)    Diarrhea, even with XR   Methylprednisolone Nausea And Vomiting and Nausea Only    As per pt she is not allergic     Metabolic Disorder Labs: Lab Results  Component Value Date   HGBA1C 6.3 05/14/2023   No results found for: "PROLACTIN" Lab Results  Component Value Date   CHOL 99 05/14/2023   TRIG 83.0 05/14/2023   HDL 37.90 (L) 05/14/2023   CHOLHDL 3 05/14/2023   VLDL 16.6 05/14/2023   LDLCALC 44 05/14/2023   LDLCALC 53 12/20/2022   Lab Results  Component Value Date   TSH 1.23 06/17/2022   TSH 0.87 07/06/2021    Therapeutic Level Labs: No results found for: "LITHIUM" No results found for: "VALPROATE" No results found for: "CBMZ"  Current Medications: Current Outpatient Medications  Medication Sig Dispense Refill   varenicline (CHANTIX) 0.5 MG tablet Take 1 tablet (0.5 mg total) by mouth 2 (two) times daily. 60 tablet 1   acetaminophen (TYLENOL) 500 MG tablet Take 500 mg by mouth in the morning, at noon, and at bedtime.     albuterol (VENTOLIN HFA) 108 (90 Base) MCG/ACT inhaler INHALE TWO PUFFS FOUR TIMES DAILY 18 g 1   amLODipine (NORVASC) 5 MG tablet Take 1 tablet (5 mg total) by mouth daily. 90 tablet 3   apixaban (ELIQUIS) 5 MG TABS tablet Take 1 tablet (5 mg total) by mouth 2 (two) times daily. 28 tablet 0   Budeson-Glycopyrrol-Formoterol (BREZTRI AEROSPHERE) 160-9-4.8 MCG/ACT AERO Inhale  2 puffs into the lungs in the morning and at bedtime. 32.1 g 3   CALCIUM PO Take 600 mg by mouth daily.      cholecalciferol (VITAMIN D3) 25 MCG (1000 UNIT) tablet Take 1,000 Units by mouth daily.     Continuous Blood Gluc Receiver DEVI Use as directed to check blood sugars daily 1 each 0   Continuous Glucose Sensor (DEXCOM G7 SENSOR) MISC Apply 1 sensor every 10 days. 3 each 3   Cyanocobalamin (VITAMIN B-12 PO) Take 2,500 mcg by mouth daily.     DULoxetine (CYMBALTA) 60 MG capsule Take 1 capsule (60 mg total) by mouth daily. 90 capsule 1   famotidine (PEPCID) 40 MG tablet Take 1 tablet (40 mg total) by mouth daily. 90 tablet 3   HM MELATONIN PO 5 mg by Other route at bedtime as needed. patch     isosorbide mononitrate (IMDUR) 30 MG 24 hr tablet Take 1 tablet (30 mg total) by mouth 2 (two) times daily. 90 tablet 3   Multiple Vitamin (MULTIVITAMIN) tablet Take 1 tablet by mouth daily.     mupirocin ointment (BACTROBAN) 2 % Apply 1 Application topically 2 (two) times daily. 22 g 0   nitroGLYCERIN (NITROSTAT) 0.4 MG SL tablet Place 1 tablet (0.4 mg total) under the tongue every 5 (five) minutes as needed for chest pain. 25 tablet 3   nystatin (MYCOSTATIN/NYSTOP) powder APPLY TO THE AFFECTED AREA(S) TWICE DAILY 30 g 0   pantoprazole (PROTONIX) 40 MG tablet TAKE 1 TABLET TWICE DAILY BEFORE MEALS 180 tablet 3   propranolol ER (INDERAL LA) 80 MG 24 hr capsule  Take 1 capsule (80 mg total) by mouth 2 (two) times daily. 180 capsule 2   rOPINIRole (REQUIP) 0.5 MG tablet TAKE 1 TABLET BY MOUTH AT LUNCHTIME AND TAKE 3MG  AT BEDTIME 90 tablet 1   rOPINIRole (REQUIP) 3 MG tablet TAKE ONE TABLET BY MOUTH AT BEDTIME. TAKE ALONG WITH 0.5MG  DAILY FOR A TOTAL DOSE OF 3.5MG  DAILY 90 tablet 1   rosuvastatin (CRESTOR) 20 MG tablet Take 1 tablet (20 mg total) by mouth daily. 90 tablet 10   Semaglutide, 1 MG/DOSE, 4 MG/3ML SOPN Inject 1 mg as directed once a week. 3 mL 0   telmisartan (MICARDIS) 80 MG tablet Take 1 tablet  (80 mg total) by mouth daily. 90 tablet 1   traZODone (DESYREL) 100 MG tablet TAKE ONE AND ONE-HALF TO TWO TABLETS BY MOUTH AT BEDTIME 180 tablet 1   triamcinolone cream (KENALOG) 0.1 % Apply 1 Application topically 2 (two) times daily. 30 g 0   trimethoprim (TRIMPEX) 100 MG tablet Take 1 tablet (100 mg total) by mouth daily. 90 tablet 3   Vibegron (GEMTESA) 75 MG TABS Take 1 tablet (75 mg total) by mouth daily. 90 tablet 3   No current facility-administered medications for this visit.     Musculoskeletal: Strength & Muscle Tone:  UTA Gait & Station:  Seated Patient leans: N/A  Psychiatric Specialty Exam: Review of Systems  Psychiatric/Behavioral:  Positive for sleep disturbance. The patient is nervous/anxious.     There were no vitals taken for this visit.There is no height or weight on file to calculate BMI.  General Appearance: Casual  Eye Contact:  Fair  Speech:  Clear and Coherent  Volume:  Normal  Mood:  Anxious  Affect:  Appropriate  Thought Process:  Goal Directed and Descriptions of Associations: Intact  Orientation:  Full (Time, Place, and Person)  Thought Content: Logical   Suicidal Thoughts:  No  Homicidal Thoughts:  No  Memory:  Immediate;   Fair Recent;   Fair Remote;   Fair  Judgement:  Fair  Insight:  Fair  Psychomotor Activity:  Normal  Concentration:  Concentration: Fair and Attention Span: Fair  Recall:  [employer] of Knowledge: Fair  Language: Fair  Akathisia:  No  Handed:  Right  AIMS (if indicated): not done  Assets:  Communication Skills Desire for Improvement Housing Social Support Talents/Skills Transportation  ADL's:  Intact  Cognition: WNL  Sleep:  Poor   Screenings: Geneticist, molecular Office Visit from 05/14/2022 in [facility] Video Visit from 04/03/2022 in [facility]  AIMS Total Score 0 0      GAD-7    Flowsheet Row Counselor from 12/20/2022  in [facility] Video Visit from 06/25/2021 in [facility] Video Visit from 05/03/2021 in [facility] Video Visit from 04/05/2021 in [facility]  Total GAD-7 Score 6 6 3 9       PHQ2-9    Flowsheet Row Video Visit from 09/26/2023 in [facility] [employer] Visit from 03/28/2023 in [facility] [employer] Clinical Support from 03/17/2023 in [facility] [employer] from 12/20/2022 in [facility] Video Visit from 12/10/2022 in [facility]  PHQ-2 Total Score 1 1 0 1 1  PHQ-9 Total Score 8 8 --  8 --      Flowsheet Row Video Visit from 10/30/2023 in [facility] Video Visit from 08/04/2023 in [facility] Video Visit from 06/24/2023 in [facility]  C-SSRS RISK CATEGORY No Risk No Risk No Risk        Assessment and Plan: Kathryn Friedman is a 67 year old Caucasian female who has a history of MDD, anxiety disorder, bereavement, sleep problem was evaluated by telemedicine today.  Patient currently stable with regards to her mood symptoms other than situational anxiety due to recent fall.  Patient interested in smoking cessation, will benefit from the following medication changes and continued psychotherapy sessions.    Major Depression in remission Currently on Cymbalta 60 mg and Trazodone 100 mg. Plans to increase Trazodone to 150 mg due to sleep issues. Discontinued Wellbutrin two weeks ago due to lack of efficacy. Seeing therapist Florentina Addison to address childhood issues. Discussed risks of increasing Trazodone, including potential for increased falls.   -  Increase Trazodone to 150 mg p.o. daily. - Discontinue Wellbutrin   - Continue Cymbalta 60 mg p.o. daily. - Continue therapy sessions with Katie    Insomnia-unstable Experiencing anxiety and sleep problems. Currently taking Requip 3 mg at bedtime and 0.5 mg in the afternoon.   - Continue Requip 3 mg at bedtime and 0.5 mg in the afternoon  - Increase trazodone to 150 mg.  Other specified anxiety disorder-unstable -Patient to continue CBT, currently follows up with Ms. Florentina Addison Bounds. -Continue Cymbalta 60 mg p.o. daily.  Smoking Cessation/tobacco use disorder-unstable Previously had side effects with Chantix but did not experience them this time. Found nicotine patches unaffordable and Wellbutrin ineffective. Wishes to retry Chantix.   - Prescribe Chantix 0.5 mg p.o. twice daily - Provided counseling for 5 minutes.  Reports a fall resulting in hip and back pain. Using a cane for ambulation and taking Tylenol, which has not been effective. An MRI was ordered by orthopedics but was denied by insurance. A peer-to-peer review is pending. Medical alert device was used to call for help; EMTs examined but did not suggest emergency department visit.   - Continue using a cane for ambulation   - Wear medical alert device at all times   - Follow up with orthopedics regarding MRI approval   - Continue taking Tylenol for pain management      Follow-up   - Contact primary doctor for an update   - Schedule follow-up appointment in 8 weeks on February 3rd at 10 AM.    Collaboration of Care: Collaboration of Care: Referral or follow-up with counselor/therapist AEB patient encouraged to continue CBT as well as follow-up with her provider for recent fall.  Patient/Guardian was advised Release of Information must be obtained prior to any record release in order to collaborate their care with an outside provider. Patient/Guardian was advised if they have not already done so to contact the registration  department to sign all necessary forms in order for [place] to release information regarding their care.   Consent: Patient/Guardian gives verbal consent for treatment and assignment of benefits for services provided during this visit. Patient/Guardian expressed understanding and agreed to proceed.   This note was generated in part or whole with voice recognition software. Voice recognition is usually quite accurate but there are transcription errors that can and very often do occur. I apologize for any typographical errors that were not detected and corrected.    Kalea Perine  Kathryn Pottle, MD 10/31/2023, 8:18 AM

## 2023-10-31 ENCOUNTER — Telehealth: Payer: Self-pay | Admitting: Internal Medicine

## 2023-10-31 ENCOUNTER — Telehealth: Payer: Self-pay

## 2023-10-31 ENCOUNTER — Other Ambulatory Visit: Payer: Self-pay

## 2023-10-31 MED ORDER — OZEMPIC (0.25 OR 0.5 MG/DOSE) 2 MG/3ML ~~LOC~~ SOPN: 0.5000 mg | PEN_INJECTOR | SUBCUTANEOUS | Status: AC

## 2023-10-31 NOTE — Telephone Encounter (Signed)
Called Kathryn Friedman to let her know that per last note- 0.5 mg q week

## 2023-10-31 NOTE — Telephone Encounter (Signed)
Kim from [facility] medication assistance stating she need to verify the dose of the pt ozempic because the pt does not know

## 2023-10-31 NOTE — Telephone Encounter (Signed)
Samples placed up front for pick up.

## 2023-11-03 ENCOUNTER — Ambulatory Visit: Payer: Medicare Other

## 2023-11-04 NOTE — Telephone Encounter (Signed)
Signed and placed back with [name].

## 2023-11-04 NOTE — Telephone Encounter (Signed)
Faxed

## 2023-11-04 NOTE — Telephone Encounter (Signed)
Received faxed Nov[employer] PAP form faxed to office.  Printed and placed in folder for provider signature. Form to be faxed back to [phone] per request of Star Age, CPhT.

## 2023-11-07 NOTE — Telephone Encounter (Signed)
 Provider portion sent to [employer] with [employer].

## 2023-11-10 ENCOUNTER — Ambulatory Visit: Payer: Medicare Other | Admitting: Physician Assistant

## 2023-11-10 ENCOUNTER — Telehealth: Payer: Self-pay

## 2023-11-10 NOTE — Telephone Encounter (Signed)
Received patient portion of APP for Breztri (AZ&ME) faxed provider portion also, awaiting signature from MD to forward to Co.

## 2023-11-22 ENCOUNTER — Encounter: Payer: Self-pay | Admitting: Internal Medicine

## 2023-11-24 ENCOUNTER — Encounter: Payer: Self-pay | Admitting: Cardiovascular Disease

## 2023-11-24 ENCOUNTER — Other Ambulatory Visit: Payer: Self-pay | Admitting: *Deleted

## 2023-11-24 MED ORDER — ISOSORBIDE MONONITRATE ER 30 MG PO TB24: 30.0000 mg | ORAL_TABLET | Freq: Two times a day (BID) | ORAL | 0 refills | Status: DC

## 2023-11-25 ENCOUNTER — Ambulatory Visit: Payer: Medicare Other | Admitting: Physician Assistant

## 2023-11-28 ENCOUNTER — Other Ambulatory Visit: Payer: Self-pay

## 2023-11-28 ENCOUNTER — Telehealth (INDEPENDENT_AMBULATORY_CARE_PROVIDER_SITE_OTHER): Payer: Medicare Other | Admitting: Internal Medicine

## 2023-11-28 ENCOUNTER — Telehealth: Payer: Medicare Other | Admitting: Internal Medicine

## 2023-11-28 ENCOUNTER — Encounter: Payer: Self-pay | Admitting: Internal Medicine

## 2023-11-28 DIAGNOSIS — I2510 Atherosclerotic heart disease of native coronary artery without angina pectoris: Secondary | ICD-10-CM | POA: Diagnosis not present

## 2023-11-28 DIAGNOSIS — J849 Interstitial pulmonary disease, unspecified: Secondary | ICD-10-CM

## 2023-11-28 DIAGNOSIS — F330 Major depressive disorder, recurrent, mild: Secondary | ICD-10-CM

## 2023-11-28 DIAGNOSIS — I700 Atherosclerosis of aorta: Secondary | ICD-10-CM

## 2023-11-28 DIAGNOSIS — I10 Essential (primary) hypertension: Secondary | ICD-10-CM | POA: Diagnosis not present

## 2023-11-28 DIAGNOSIS — R197 Diarrhea, unspecified: Secondary | ICD-10-CM

## 2023-11-28 DIAGNOSIS — E7800 Pure hypercholesterolemia, unspecified: Secondary | ICD-10-CM

## 2023-11-28 DIAGNOSIS — E1159 Type 2 diabetes mellitus with other circulatory complications: Secondary | ICD-10-CM

## 2023-11-28 DIAGNOSIS — I779 Disorder of arteries and arterioles, unspecified: Secondary | ICD-10-CM

## 2023-11-28 DIAGNOSIS — I480 Paroxysmal atrial fibrillation: Secondary | ICD-10-CM

## 2023-11-28 DIAGNOSIS — J449 Chronic obstructive pulmonary disease, unspecified: Secondary | ICD-10-CM

## 2023-11-28 DIAGNOSIS — K219 Gastro-esophageal reflux disease without esophagitis: Secondary | ICD-10-CM

## 2023-11-28 DIAGNOSIS — G4733 Obstructive sleep apnea (adult) (pediatric): Secondary | ICD-10-CM
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
## 2023-11-29 ENCOUNTER — Other Ambulatory Visit: Payer: Self-pay | Admitting: Internal Medicine

## 2023-11-29 ENCOUNTER — Encounter: Payer: Self-pay | Admitting: Internal Medicine

## 2023-11-29 DIAGNOSIS — R197 Diarrhea, unspecified: Secondary | ICD-10-CM

## 2023-11-29 NOTE — Assessment & Plan Note (Signed)
 On crestor.  Low cholesterol diet and exercise.  Follow lipid panel and liver function tests.

## 2023-11-29 NOTE — Assessment & Plan Note (Signed)
 CPAP. Followed by pulmonary.

## 2023-11-29 NOTE — Assessment & Plan Note (Signed)
 Low carb diet and exercise.  Follow met b and a1c. Continue GLP - 1 agonist, on decreased dose to .5mg . Discussed low sugars at night.  Has sensor. Discussed the need to eat a snack prior to bed. Follow sugars and send in readings. Let me know if persistent problems.

## 2023-11-29 NOTE — Assessment & Plan Note (Signed)
Breztri.  Breathing stable.  

## 2023-11-29 NOTE — Assessment & Plan Note (Signed)
 S/p stent placement.  Continue lipitor. Followed by cardiology.  On eliquis.   Had discussed with cardiology changing to xarelto. Her insurance rolled over and she is not concerned regarding doughnut hole now. Plans to d/w cardiology.

## 2023-11-29 NOTE — Assessment & Plan Note (Signed)
 On eliquis currently. Stable.  Continue f/u with cardiology as outlined.

## 2023-11-29 NOTE — Assessment & Plan Note (Signed)
 Continue lipitor  ?

## 2023-11-29 NOTE — Assessment & Plan Note (Signed)
No upper symptoms reported.  On protonix.   

## 2023-11-29 NOTE — Assessment & Plan Note (Signed)
 Sees Dr Meredeth Ide.  Using breztri.  Breathing stable.  Dr Meredeth Ide recommended f/u chest CT in 02/2023 -. Lung-RADS 2S, benign appearance or behavior. Continue annual screening with low-dose chest CT without contrast in 12 months.  Mild diffuse bronchial wall thickening with mild centrilobular and paraseptal emphysema; imaging findings suggestive of underlying COPD.

## 2023-11-29 NOTE — Assessment & Plan Note (Signed)
 Continue micardis 80mg  q day and amlodipine 5mg  q day.  Dr Mariah Milling increased propranolol. Blood pressures have been under reasonable control. Follow pressures.  Follow metabolic panel.

## 2023-11-29 NOTE — Assessment & Plan Note (Signed)
 Diarrhea as outlined. Persistent. Has taken imodium. Check stool studies.  Follow.  Benefiber.

## 2023-11-29 NOTE — Assessment & Plan Note (Signed)
Being followed by psychiatry.  On cymbalta.  Overall appears to be doing better.  Follow.

## 2023-11-29 NOTE — Progress Notes (Signed)
 Order placed for GI panel.

## 2023-12-01 ENCOUNTER — Telehealth: Payer: Self-pay

## 2023-12-01 ENCOUNTER — Telehealth: Payer: Self-pay | Admitting: Cardiovascular Disease

## 2023-12-01 ENCOUNTER — Ambulatory Visit: Payer: Medicare Other

## 2023-12-01 DIAGNOSIS — Z79899 Other long term (current) drug therapy: Secondary | ICD-10-CM | POA: Diagnosis not present

## 2023-12-01 NOTE — Telephone Encounter (Signed)
 Spoke with patient and reviewed that xarelto required letter to be done. Letter completed and application re faxed to company. Will wait for them to review application.   Application faxed back to company

## 2023-12-01 NOTE — Telephone Encounter (Signed)
 Spoke with patient and reviewed that she should have received letter with each application sent in. She reports not getting any letters. Advised that I would call on both and then call her back with updates.

## 2023-12-01 NOTE — Telephone Encounter (Signed)
 Patient is requesting a call back from Bernette Mayers, patient did not state what she wanted to discuss.

## 2023-12-01 NOTE — Telephone Encounter (Signed)
 Called pt to get her rescheduled tried to get pt scheduled pt complained of being diabetic and not wanting to be fasting all day I offered a plan to still place her in the afternoon but that I would put a note in that she will come early. She said very rudely well what day I said I have tomorrow pt yelled I can't come in tomorrow and yelled I will just call back and hung up the phone.

## 2023-12-03 ENCOUNTER — Encounter (INDEPENDENT_AMBULATORY_CARE_PROVIDER_SITE_OTHER): Payer: Self-pay | Admitting: Nurse Practitioner

## 2023-12-08 ENCOUNTER — Encounter: Payer: Self-pay | Admitting: Internal Medicine

## 2023-12-08 ENCOUNTER — Telehealth: Payer: Self-pay | Admitting: *Deleted

## 2023-12-08 DIAGNOSIS — I480 Paroxysmal atrial fibrillation: Secondary | ICD-10-CM

## 2023-12-08 MED ORDER — APIXABAN 5 MG PO TABS: 5.0000 mg | ORAL_TABLET | Freq: Two times a day (BID) | ORAL | 0 refills | Status: DC

## 2023-12-08 NOTE — Telephone Encounter (Signed)
Samples ready for patient to pick up 

## 2023-12-08 NOTE — Telephone Encounter (Signed)
 Spoke with [employer] to get letter of determination and they transferred to pharmacy team.  Pharmacy # if disconnected [phone]

## 2023-12-08 NOTE — Telephone Encounter (Signed)
 After being transferred 4 times I am still not able to get the coverage determination. Will reach out to our pharmacy team for further assistance.

## 2023-12-08 NOTE — Telephone Encounter (Signed)
 Left voicemail message to call back

## 2023-12-08 NOTE — Telephone Encounter (Signed)
 Please make sure patient is aware that there is an option for a payment plan with medicare this year. If upfront cost (deductible) is too much to pay at once, she can spread it out across 12 months  Has she applied for low income subsidy? This can help decrease her costs  I will ask the Rx team to do PA for Xarelto  Pradaxa is about ~70/month with good rx coupon  Warfarin is inexpensive, but would require more monitoring

## 2023-12-08 NOTE — Telephone Encounter (Signed)
 Spoke with pharmacy team and they routed me back to number for the plan to assist with letter for determination.

## 2023-12-09 ENCOUNTER — Telehealth: Payer: Medicare Other | Admitting: Psychiatry

## 2023-12-09 ENCOUNTER — Other Ambulatory Visit (HOSPITAL_COMMUNITY): Payer: Self-pay

## 2023-12-09 ENCOUNTER — Telehealth: Payer: Self-pay | Admitting: Pharmacy Technician

## 2023-12-09 ENCOUNTER — Encounter: Payer: Self-pay | Admitting: Psychiatry

## 2023-12-09 DIAGNOSIS — F17200 Nicotine dependence, unspecified, uncomplicated: Secondary | ICD-10-CM | POA: Diagnosis not present

## 2023-12-09 DIAGNOSIS — G4701 Insomnia due to medical condition: Secondary | ICD-10-CM

## 2023-12-09 DIAGNOSIS — G2581 Restless legs syndrome: Secondary | ICD-10-CM

## 2023-12-09 DIAGNOSIS — F3342 Major depressive disorder, recurrent, in full remission: Secondary | ICD-10-CM | POA: Diagnosis not present

## 2023-12-09 DIAGNOSIS — F418 Other specified anxiety disorders: Secondary | ICD-10-CM

## 2023-12-09 MED ORDER — DULOXETINE HCL 60 MG PO CPEP: 60.0000 mg | ORAL_CAPSULE | Freq: Every day | ORAL | 3 refills | Status: DC

## 2023-12-09 MED ORDER — TRAZODONE HCL 100 MG PO TABS: 100.0000 mg | ORAL_TABLET | Freq: Every day | ORAL | 3 refills | Status: DC

## 2023-12-09 MED ORDER — ROPINIROLE HCL 0.5 MG PO TABS: 0.5000 mg | ORAL_TABLET | Freq: Every day | ORAL | 3 refills | Status: DC

## 2023-12-09 MED ORDER — ROPINIROLE HCL 3 MG PO TABS: 3.0000 mg | ORAL_TABLET | Freq: Every day | ORAL | 3 refills | Status: DC

## 2023-12-09 MED ORDER — VARENICLINE TARTRATE 0.5 MG PO TABS: 0.5000 mg | ORAL_TABLET | Freq: Two times a day (BID) | ORAL | 3 refills | Status: DC

## 2023-12-09 NOTE — Telephone Encounter (Addendum)
 Pharmacy Patient Advocate Encounter   Received notification from Pt Calls Messages that prior authorization for xarelto  is required/requested.   Insurance verification completed.   The patient is insured through  [employer]  .   Per test claim: The current 12/09/23 day co-pay is, $45.00 one month.  No PA needed at this time. This test claim was processed through [facility]- copay amounts may vary at other pharmacies due to pharmacy/plan contracts, or as the patient moves through the different stages of their insurance plan.    PA not required

## 2023-12-09 NOTE — Progress Notes (Signed)
 Virtual Visit via Video Note  I connected with Kathryn Friedman on 12/09/23 at  2:30 PM EST by a video enabled telemedicine application and verified that I am speaking with the correct person using two identifiers.  Location Provider Location : [facility] Patient Location : Home  Participants: Patient , Provider    I discussed the limitations of evaluation and management by telemedicine and the availability of in person appointments. The patient expressed understanding and agreed to proceed.   I discussed the assessment and treatment plan with the patient. The patient was provided an opportunity to ask questions and all were answered. The patient agreed with the plan and demonstrated an understanding of the instructions.   The patient was advised to call back or seek an in-person evaluation if the symptoms worsen or if the condition fails to improve as anticipated.    BH MD OP Progress Note  12/10/2023 12:49 PM Kathryn Friedman  MRN:  [id number]  Chief Complaint:  Chief Complaint  Patient presents with   Follow-up   Depression   Anxiety   Medication Refill   HPI: Kathryn Friedman is a 68 year old Caucasian female, widowed, retired, on [employer], lives in [place], has a history of MDD, insomnia, obstructive sleep apnea on CPAP, history of coronary artery disease status post stent placement, COPD, diabetes mellitus, hypertension, GERD, interstitial lung disease, iron deficiency was evaluated by telemedicine today.  The patient has been experiencing recurrent stomach upset, characterized by pain and frequent diarrhea. This issue resurfaced in December and has persisted for approximately two weeks. The patient has a history of similar symptoms, previously attributed to metformin , which she has not taken for over a year.  In addition to gastrointestinal issues, the patient has been dealing with significant emotional stress due to multiple personal anniversaries and the holiday season. She has  been seeking support from a therapist and a close friend.  Regarding her depression, anxiety, the patient reports feeling better overall and does not believe any changes to her medication regimen are necessary. She is currently taking trazodone , which was increased at the last visit, and reports improved sleep.  She continues to take requip  which helps with restless leg symptoms.  She also stopped taking Wellbutrin  and started Chantix , which has helped reduce her smoking from one and a half to two packs a day to about one pack a day. The patient is working towards quitting smoking entirely but acknowledges the difficulty due to her long history of smoking.  The patient denies any current suicidality, homicidality or perceptual disturbances.    Visit Diagnosis:    ICD-10-CM   1. MDD (major depressive disorder), recurrent, in full remission (HCC)  F33.42 DULoxetine  (CYMBALTA ) 60 MG capsule    2. RLS (restless legs syndrome)  G25.81 rOPINIRole  (REQUIP ) 3 MG tablet    3. Other specified anxiety disorders  F41.8    Limited symptom attacks    4. Tobacco use disorder  F17.200 varenicline  (CHANTIX ) 0.5 MG tablet    5. Insomnia due to medical condition  G47.01 traZODone  (DESYREL ) 100 MG tablet   depression,anxiety             Past Psychiatric History: I have reviewed past psychiatric history from progress note on 06/26/2020.  Past trials of trazodone , Cymbalta , Lexapro , Rexulti, doxepin , gabapentin , Lunesta .  Patient was previously under the care of [facility].  Past Medical History:  Past Medical History:  Diagnosis Date   Anemia    Anginal pain (HCC)  Anxiety    Arthritis    back and knees   Asthma    Bilateral carotid artery stenosis    Blood in stool    Chronic diarrhea    COPD (chronic obstructive pulmonary disease) (HCC)    Coronary artery disease    a.) 75% pRCA; 3.5 x 28 mm Cypher DES placed on 07/24/2006   Current use of long term anticoagulation     Clopidogrel    Depression    secondary to the death of her husband (died 19)   Diverticulitis    Diverticulosis    Dizzinesses    Dysphagia    Dyspnea    Dysrhythmia    Fatty infiltration of liver    GERD (gastroesophageal reflux disease)    Headache    History of 2019 novel coronavirus disease (COVID-19) 12/09/2020   History of 2019 novel coronavirus disease (COVID-19) 12/20/2020   Hypertension    Hypertriglyceridemia    ILD (interstitial lung disease) (HCC)    Lump in the abdomen    OSA on CPAP    Overactive bladder    PSVT (paroxysmal supraventricular tachycardia) (HCC)    Spastic colon    T2DM (type 2 diabetes mellitus) (HCC) 05/2008   Tobacco abuse    Venous insufficiency of both lower extremities     Past Surgical History:  Procedure Laterality Date   ABDOMINAL HYSTERECTOMY  with left ovary in place 1996   APPENDECTOMY  1985   gallbladder and Appendix   BREAST BIOPSY Left 10/14/2017   calcs bx, fibrosis giant cell reaction and chronic inflammation, negative for malignancy.    CARPOMETACARPAL (CMC) FUSION OF THUMB Right 07/10/2022   Procedure: CARPOMETACARPAL (CMC) SUSPENSION OF RIGHT THUMB;  Surgeon: Edie Norleen PARAS, MD;  Location: [facility] ORS;  Service: Orthopedics;  Laterality: Right;   CATARACT EXTRACTION, BILATERAL     CESAREAN SECTION  1984   CHOLECYSTECTOMY  1985   COLONOSCOPY WITH PROPOFOL  N/A 09/13/2016   Procedure: COLONOSCOPY WITH PROPOFOL ;  Surgeon: Lamar ONEIDA Holmes, MD;  Location: [facility] ENDOSCOPY;  Service: Endoscopy;  Laterality: N/A;   COLONOSCOPY WITH PROPOFOL  N/A 11/09/2018   Procedure: COLONOSCOPY WITH PROPOFOL ;  Surgeon: Holmes Lamar ONEIDA, MD;  Location: [facility] ENDOSCOPY;  Service: Endoscopy;  Laterality: N/A;   COLONOSCOPY WITH PROPOFOL  N/A 03/29/2020   Procedure: COLONOSCOPY WITH PROPOFOL ;  Surgeon: Dessa Reyes ORN, MD;  Location: [facility] ENDOSCOPY;  Service: Endoscopy;  Laterality: N/A;   CORONARY ANGIOPLASTY WITH STENT PLACEMENT N/A 07/24/2006   75%  pRCA; 3.5 x 28 mm Cypher DES placed; Location: [facility]; Surgeons: Margie Lovelace, MD   ESOPHAGOGASTRODUODENOSCOPY (EGD) WITH PROPOFOL  N/A 02/02/2018   Procedure: ESOPHAGOGASTRODUODENOSCOPY (EGD) WITH PROPOFOL ;  Surgeon: Holmes Lamar ONEIDA, MD;  Location: [facility] ENDOSCOPY;  Service: Endoscopy;  Laterality: N/A;   ESOPHAGOGASTRODUODENOSCOPY (EGD) WITH PROPOFOL  N/A 03/29/2020   Procedure: ESOPHAGOGASTRODUODENOSCOPY (EGD) WITH PROPOFOL ;  Surgeon: Dessa Reyes ORN, MD;  Location: [facility] ENDOSCOPY;  Service: Endoscopy;  Laterality: N/A;   EYE SURGERY     JOINT REPLACEMENT     bilateral knee replacements   KNEE ARTHROSCOPY  Arthroscopic left knee surgery    KNEE SURGERY  status post knee surgey    LEFT HEART CATH AND CORONARY ANGIOGRAPHY Left 05/14/2018   Procedure: LEFT HEART CATH AND CORONARY ANGIOGRAPHY;  Surgeon: Hester Wolm PARAS, MD;  Location: [facility] INVASIVE CV LAB;  Service: Cardiovascular;  Laterality: Left;   REPLACEMENT TOTAL KNEE  ([facility])   SHOULDER SURGERY  shoulder operation secondary to a torn tendon   TOTAL HIP ARTHROPLASTY  Left 06/12/2021   Procedure: TOTAL HIP ARTHROPLASTY;  Surgeon: Edie Norleen PARAS, MD;  Location: [facility] ORS;  Service: Orthopedics;  Laterality: Left;    Family Psychiatric History: I have reviewed family psychiatric history from progress note on 06/26/2020.  Family History:  Family History  Problem Relation Age of Onset   Other Mother        Hit by a fire truck and has had multiple operations on her back , and has history of MVP    Mitral valve prolapse Mother    Lung cancer Mother    Depression Mother    Heart disease Father        myocardial infarction and is status post bypass surgery   Mitral valve prolapse Sister    Bipolar disorder Sister    Hepatitis C Brother    Cirrhosis Brother    Colon cancer Paternal Aunt    Breast cancer Neg Hx    Prostate cancer Neg Hx    Bladder Cancer Neg Hx    Kidney cancer Neg Hx     Social History: I have reviewed social  history from progress note on 06/26/2020. Social History   Socioeconomic History   Marital status: Widowed    Spouse name: Myonna Chisom   Number of children: 1   Years of education: 12   Highest education level: Associate degree: occupational, scientist, product/process development, or vocational program  Occupational History    Employer: [employer]  Tobacco Use   Smoking status: Every Day    Current packs/day: 2.00    Average packs/day: 2.0 packs/day for 45.0 years (90.0 ttl pk-yrs)    Types: Cigarettes    Passive exposure: Current   Smokeless tobacco: Never   Tobacco comments:    Smokes 18 cigarettes every day 12/24/22  Vaping Use   Vaping status: Former   Substances: Flavoring  Substance and Sexual Activity   Alcohol use: No    Alcohol/week: 0.0 standard drinks of alcohol   Drug use: No   Sexual activity: Not Currently  Other Topics Concern   Not on file  Social History Narrative   Not on file   Social Drivers of Health   Financial Resource Strain: Low Risk  (03/13/2023)   Overall Financial Resource Strain (CARDIA)    Difficulty of Paying Living Expenses: Not very hard  Recent Concern: Financial Resource Strain - Medium Risk (03/11/2023)   Overall Financial Resource Strain (CARDIA)    Difficulty of Paying Living Expenses: Somewhat hard  Food Insecurity: Food Insecurity Present (03/13/2023)   Hunger Vital Sign    Worried About Running Out of Food in the Last Year: Sometimes true    Ran Out of Food in the Last Year: Never true  Transportation Needs: No Transportation Needs (03/13/2023)   PRAPARE - Administrator, Civil Service (Medical): No    Lack of Transportation (Non-Medical): No  Physical Activity: Inactive (03/13/2023)   Exercise Vital Sign    Days of Exercise per Week: 0 days    Minutes of Exercise per Session: 0 min  Stress: Stress Concern Present (03/13/2023)   [employer] of Occupational Health - Occupational Stress Questionnaire    Feeling of Stress : To some extent  Social  Connections: Socially Isolated (03/13/2023)   Social Connection and Isolation Panel [NHANES]    Frequency of Communication with Friends and Family: More than three times a week    Frequency of Social Gatherings with Friends and Family: Twice a week    Attends Religious Services:  Never    Active Member of Clubs or Organizations: No    Attends Banker Meetings: Never    Marital Status: Widowed    Allergies:  Allergies  Allergen Reactions   Varenicline  Tartrate    Jardiance [Empagliflozin] Other (See Comments)    Yeast infection   Metformin  And Related Other (See Comments)    Diarrhea, even with XR   Methylprednisolone Nausea And Vomiting and Nausea Only    As per pt she is not allergic     Metabolic Disorder Labs: Lab Results  Component Value Date   HGBA1C 6.3 05/14/2023   No results found for: PROLACTIN Lab Results  Component Value Date   CHOL 99 05/14/2023   TRIG 83.0 05/14/2023   HDL 37.90 (L) 05/14/2023   CHOLHDL 3 05/14/2023   VLDL 16.6 05/14/2023   LDLCALC 44 05/14/2023   LDLCALC 53 12/20/2022   Lab Results  Component Value Date   TSH 1.23 06/17/2022   TSH 0.87 07/06/2021    Therapeutic Level Labs: No results found for: LITHIUM No results found for: VALPROATE No results found for: CBMZ  Current Medications: Current Outpatient Medications  Medication Sig Dispense Refill   acetaminophen  (TYLENOL ) 500 MG tablet Take 500 mg by mouth in the morning, at noon, and at bedtime.     albuterol  (VENTOLIN  HFA) 108 (90 Base) MCG/ACT inhaler INHALE TWO PUFFS FOUR TIMES DAILY 18 g 1   amLODipine  (NORVASC ) 5 MG tablet Take 1 tablet (5 mg total) by mouth daily. 90 tablet 3   apixaban  (ELIQUIS ) 5 MG TABS tablet Take 1 tablet (5 mg total) by mouth 2 (two) times daily. 28 tablet 0   apixaban  (ELIQUIS ) 5 MG TABS tablet Take 1 tablet (5 mg total) by mouth 2 (two) times daily. 56 tablet 0   Budeson-Glycopyrrol-Formoterol  (BREZTRI  AEROSPHERE) 160-9-4.8 MCG/ACT  AERO Inhale 2 puffs into the lungs in the morning and at bedtime. 32.1 g 3   CALCIUM  PO Take 600 mg by mouth daily.      cholecalciferol (VITAMIN D3) 25 MCG (1000 UNIT) tablet Take 1,000 Units by mouth daily.     Continuous Blood Gluc Receiver DEVI Use as directed to check blood sugars daily 1 each 0   Continuous Glucose Sensor (DEXCOM G7 SENSOR) MISC Apply 1 sensor every 10 days. 3 each 3   Cyanocobalamin (VITAMIN B-12 PO) Take 2,500 mcg by mouth daily.     DULoxetine  (CYMBALTA ) 60 MG capsule Take 1 capsule (60 mg total) by mouth daily. 90 capsule 3   famotidine  (PEPCID ) 40 MG tablet Take 1 tablet (40 mg total) by mouth daily. 90 tablet 3   HM MELATONIN PO 5 mg by Other route at bedtime as needed. patch     isosorbide  mononitrate (IMDUR ) 30 MG 24 hr tablet Take 1 tablet (30 mg total) by mouth 2 (two) times daily. NEEDS APPOINTMENT FOR FURTHER REFILLS 180 tablet 0   Multiple Vitamin (MULTIVITAMIN) tablet Take 1 tablet by mouth daily.     mupirocin  ointment (BACTROBAN ) 2 % Apply 1 Application topically 2 (two) times daily. 22 g 0   nitroGLYCERIN  (NITROSTAT ) 0.4 MG SL tablet Place 1 tablet (0.4 mg total) under the tongue every 5 (five) minutes as needed for chest pain. 25 tablet 3   nystatin  (MYCOSTATIN /NYSTOP ) powder APPLY TO THE AFFECTED AREA(S) TWICE DAILY 30 g 0   pantoprazole  (PROTONIX ) 40 MG tablet TAKE 1 TABLET TWICE DAILY BEFORE MEALS 180 tablet 3   propranolol  ER (INDERAL  LA) 80 MG  24 hr capsule Take 1 capsule (80 mg total) by mouth 2 (two) times daily. 180 capsule 2   rOPINIRole  (REQUIP ) 0.5 MG tablet Take 1 tablet (0.5 mg total) by mouth daily after lunch. Take along with 3 mg daily( total of 3.5 mg daily ) 90 tablet 3   rOPINIRole  (REQUIP ) 3 MG tablet Take 1 tablet (3 mg total) by mouth at bedtime. Take along with 0.5 mg daily , total of 3.5 mg daily 90 tablet 3   rosuvastatin  (CRESTOR ) 20 MG tablet Take 1 tablet (20 mg total) by mouth daily. 90 tablet 10   Semaglutide ,0.25 or 0.5MG /DOS,  (OZEMPIC , 0.25 OR 0.5 MG/DOSE,) 2 MG/3ML SOPN Inject 0.5 mg into the skin once a week.     telmisartan  (MICARDIS ) 80 MG tablet Take 1 tablet (80 mg total) by mouth daily. 90 tablet 1   traZODone  (DESYREL ) 100 MG tablet Take 1-2 tablets (100-200 mg total) by mouth at bedtime. 180 tablet 3   triamcinolone  cream (KENALOG ) 0.1 % Apply 1 Application topically 2 (two) times daily. 30 g 0   trimethoprim  (TRIMPEX ) 100 MG tablet Take 1 tablet (100 mg total) by mouth daily. 90 tablet 3   varenicline  (CHANTIX ) 0.5 MG tablet Take 1 tablet (0.5 mg total) by mouth 2 (two) times daily. 60 tablet 3   Vibegron  (GEMTESA ) 75 MG TABS Take 1 tablet (75 mg total) by mouth daily. 90 tablet 3   No current facility-administered medications for this visit.     Musculoskeletal: Strength & Muscle Tone:  UTA Gait & Station:  Seated Patient leans: N/A  Psychiatric Specialty Exam: Review of Systems  Psychiatric/Behavioral:  Positive for dysphoric mood.     There were no vitals taken for this visit.There is no height or weight on file to calculate BMI.  General Appearance: Fairly Groomed  Eye Contact:  Fair  Speech:  Clear and Coherent  Volume:  Normal  Mood:   sadness situational  Affect:  Appropriate  Thought Process:  Goal Directed and Descriptions of Associations: Intact  Orientation:  Full (Time, Place, and Person)  Thought Content: Logical   Suicidal Thoughts:  No  Homicidal Thoughts:  No  Memory:  Immediate;   Fair Recent;   Fair Remote;   Fair  Judgement:  Fair  Insight:  Fair  Psychomotor Activity:  Normal  Concentration:  Concentration: Fair and Attention Span: Fair  Recall:  [employer] of Knowledge: Fair  Language: Fair  Akathisia:  No  Handed:  Right  AIMS (if indicated): not done  Assets:  Desire for Improvement Housing Social Support  ADL's:  Intact  Cognition: WNL  Sleep:  Fair   Screenings: Midwife Visit from [facility]/14/2022 in [facility] Video Visit from 04/03/2022 in [facility]  AIMS Total Score 0 0      GAD-7    Flowsheet Row Counselor from 12/20/2022 in [facility] Video Visit from 06/25/2021 in [facility] Video Visit from 05/03/2021 in [facility] Video Visit from 04/05/2021 in [facility]  Total GAD-7 Score 6 6 3 9       PHQ2-9    Flowsheet Row Video Visit from 09/26/2023 in [facility] [employer] Visit from 03/28/2023 in [facility] [employer] Clinical Support from 03/17/2023 in [facility]  [employer] Counselor from 12/20/2022 in [facility] Video Visit from 12/10/2022 in [facility]  PHQ-2 Total Score 1 1 0 1 1  PHQ-9 Total Score 8 8 -- 8 --      Flowsheet Row Video Visit from 12/09/2023 in [facility] Video Visit from 10/30/2023 in [facility] Video Visit from 08/04/2023 in [facility]  C-SSRS RISK CATEGORY No Risk No Risk No Risk        Assessment and Plan: Kathryn Friedman is a 68 year old Caucasian female who has a history of MDD, anxiety disorder, bereavement, sleep problem was evaluated by telemedicine today.  Patient is currently improving on the current medication regimen with regards to her mood and sleep although currently has GI issues which she is working with her primary care provider on, discussed assessment and plan as noted below.  MDD in remission Improved mood and sleep with current medications. Stopped Wellbutrin . Continues beneficial therapy with Katie. No suicidal or homicidal  ideation. - Continue trazodone  100-200 mg at bedtime as needed - Continue duloxetine  60 mg daily - Continue therapy with Ms.Katie Bounds  Other specified anxiety disorder-improving Patient currently dealing with anxiety regarding her physical complaints-GI problems. - Continue psychotherapy sessions. - Continue duloxetine  60 mg daily  Smoking Cessation-improving Reduced smoking to 1 pack per day with Chantix . Prefers gradual reduction. No anxiety symptoms. - Continue Chantix  0.5 mg twice daily - Encourage continued reduction in smoking  Restless Legs Syndrome-stable Symptoms managed with ropinirole . Adjusted timing of 0.5 mg dose to 5 PM for better control. - Continue ropinirole  3 mg at bedtime - Continue ropinirole  0.5 mg at 5 PM - Continue trazodone  100-200 mg at bedtime as needed for sleep.   Follow-up - Schedule follow-up appointment for March 12 at 9 AM in the new office location.   Collaboration of Care: Collaboration of Care: Referral or follow-up with counselor/therapist AEB patient encouraged to continue to follow up with therapist as well as work with her primary care provider for her GI symptoms.  Patient/Guardian was advised Release of Information must be obtained prior to any record release in order to collaborate their care with an outside provider. Patient/Guardian was advised if they have not already done so to contact the registration department to sign all necessary forms in order for us  to release information regarding their care.   Consent: Patient/Guardian gives verbal consent for treatment and assignment of benefits for services provided during this visit. Patient/Guardian expressed understanding and agreed to proceed.   This note was generated in part or whole with voice recognition software. Voice recognition is usually quite accurate but there are transcription errors that can and very often do occur. I apologize for any typographical errors that were not  detected and corrected.    Kaston Faughn, MD 12/10/2023, 12:49 PM

## 2023-12-10 ENCOUNTER — Other Ambulatory Visit (HOSPITAL_COMMUNITY): Payer: Self-pay

## 2023-12-10 NOTE — Telephone Encounter (Signed)
 PAP: Application for Breztri  has been submitted to [employer] ([employer]), via fax for 2025.

## 2023-12-11 NOTE — Telephone Encounter (Signed)
We don't work with prior authorizations and I'm not sure what they're asking for.  Xarelto is a covered drug on his plan.  You'll need to find out from manufacturer what exactly they are looking for.  Sounds like they're asking for verification of her insurance benefits

## 2023-12-12 MED ORDER — APIXABAN 5 MG PO TABS: 5.0000 mg | ORAL_TABLET | Freq: Two times a day (BID) | ORAL | 11 refills | Status: DC

## 2023-12-12 NOTE — Addendum Note (Signed)
Addended by: Bryna Colander on: 12/12/2023 03:36 PM   Modules accepted: Orders

## 2023-12-12 NOTE — Telephone Encounter (Signed)
Spoke with patient and she requested that I send in refill to [facility]. Prescription sent and she had no further questions.

## 2023-12-12 NOTE — Telephone Encounter (Signed)
Spoke with patient and reviewed that she will need to meet her deductible before she could get the medication for $45.00. Encouraged her to call insurance company to determine her deductible amount. She verbalized understanding with no further questions at this time.       December 09, 2023 Art Buff, CPhT to Olene Floss, RPH-CPP    12/09/23  9:05 AM Xarelto 10 MG daily- $45.00 copay for one month. Patient has deductible to meet which looks like it was tacked on to the Eliquis prescription so I test claimed the 2.5MG  just to get an estimate. Copay for Eliquis ran as $45.00 as well after deductible is met

## 2023-12-15 ENCOUNTER — Telehealth: Payer: Self-pay | Admitting: *Deleted

## 2023-12-15 NOTE — Telephone Encounter (Signed)
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   Botox verification completed- Yes  Pa needed- No  Insurance contacted - Pa initiated/ complete         Auth number:___SR# [id number] __  Approval dates : __1/1/25-12/31/25_   Denied:_________________________    Botox scheduled:____5/19/2025_______  U/A & Culture ordered and scheduled:____5/5/2025_______  Pt aware and instructions given.   JQ aware to order Botox.   Date: 12/15/23

## 2023-12-17 NOTE — Telephone Encounter (Signed)
PAP: Patient assistance application Breztri for has been approved by PAP Companies: [employer] from 11-26-2023 to 11-24-2024. Medication should be delivered to PAP Delivery: Home For further shipping updates, please contact [employer] ([employer]) at [phone] Pt ID is: not provided

## 2023-12-18 ENCOUNTER — Other Ambulatory Visit: Payer: Medicare Other

## 2023-12-21 ENCOUNTER — Encounter: Payer: Self-pay | Admitting: Internal Medicine

## 2023-12-22 ENCOUNTER — Telehealth: Payer: Self-pay | Admitting: Cardiovascular Disease

## 2023-12-22 MED ORDER — PROPRANOLOL HCL ER 80 MG PO CP24: 80.0000 mg | ORAL_CAPSULE | Freq: Two times a day (BID) | ORAL | 0 refills | Status: DC

## 2023-12-22 NOTE — Telephone Encounter (Signed)
*  STAT* If patient is at the pharmacy, call can be transferred to refill team.   1. Which medications need to be refilled? (please list name of each medication and dose if known) propranolol ER (INDERAL LA) 80 MG 24 hr capsule   2. Which pharmacy/location (including street and city if local pharmacy) is medication to be sent to?  [facility] - [place], [place] - [address]      3. Do they need a 30 day or 90 day supply? 90 day

## 2023-12-23 ENCOUNTER — Encounter: Payer: Self-pay | Admitting: Urology

## 2023-12-23 DIAGNOSIS — R200 Anesthesia of skin: Secondary | ICD-10-CM | POA: Diagnosis not present

## 2023-12-23 DIAGNOSIS — M5412 Radiculopathy, cervical region: Secondary | ICD-10-CM | POA: Diagnosis not present

## 2023-12-23 MED ORDER — GEMTESA 75 MG PO TABS: 75.0000 mg | ORAL_TABLET | Freq: Every day | ORAL | 3 refills | Status: DC

## 2023-12-24 NOTE — Progress Notes (Addendum)
Pharmacy Medication Assistance Program Note    12/24/2023  Patient ID: Kathryn Friedman, female   DOB: 10-Oct-1956, 68 y.o.   MRN: [id number]     10/31/2023 11/10/2023  Outreach Medication One  Initial Outreach Date (Medication One) 10/22/2023 11/03/2023  Manufacturer Medication One [employer] [facility] Nurse, adult Drugs  Bretztri  [facility] Ozempic   Dose of Ozempic 0.5mg  weekly   Type of Radiographer, therapeutic Assistance   Date Application Sent to Prescriber 10/31/2023 11/10/2023  Name of Prescriber Charlene Mamie Laurel  Date Application Received From Patient  11/10/2023  Application Items Received From Patient  Application;Proof of Income  Patient Assistance Determination Approved   Approval Start Date 11/26/2023   Approval End Date 11/24/2024   Patient Notification Method MyChart         12/24/2023  Patient ID: Kathryn Friedman, female  DOB: 1955-12-05, 69 y.o.  MRN:  [id number]     10/31/2023  Outreach Medication Two  Initial Outreach Date (Medication Two) 10/22/2023  Manufacturer Medication Two [employer]  [employer] Bretztri  Dose of Breztri 2 puffs Twice daily.  Type of Assistance Manufacturer Assistance  Date Application Sent to Patient 10/31/2023  Name of Prescriber [place]

## 2023-12-25 ENCOUNTER — Other Ambulatory Visit: Payer: Medicare Other

## 2023-12-29 ENCOUNTER — Encounter: Payer: Self-pay | Admitting: Internal Medicine

## 2023-12-29 ENCOUNTER — Other Ambulatory Visit: Payer: Medicare Other

## 2023-12-29 ENCOUNTER — Encounter: Payer: Self-pay | Admitting: Urology

## 2023-12-31 ENCOUNTER — Other Ambulatory Visit: Payer: Self-pay

## 2023-12-31 DIAGNOSIS — E1159 Type 2 diabetes mellitus with other circulatory complications: Secondary | ICD-10-CM

## 2023-12-31 MED ORDER — FAMOTIDINE 40 MG PO TABS: 40.0000 mg | ORAL_TABLET | Freq: Every day | ORAL | 3 refills | Status: DC

## 2023-12-31 MED ORDER — DEXCOM G7 SENSOR MISC: 11 refills | Status: AC

## 2023-12-31 MED ORDER — TELMISARTAN 80 MG PO TABS: 80.0000 mg | ORAL_TABLET | Freq: Every day | ORAL | 1 refills | Status: DC

## 2023-12-31 MED ORDER — ROSUVASTATIN CALCIUM 20 MG PO TABS: 20.0000 mg | ORAL_TABLET | Freq: Every day | ORAL | 3 refills | Status: AC

## 2023-12-31 MED ORDER — PANTOPRAZOLE SODIUM 40 MG PO TBEC: DELAYED_RELEASE_TABLET | ORAL | 3 refills | Status: AC

## 2023-12-31 NOTE — Telephone Encounter (Signed)
 Done.  See other note

## 2024-01-02 DIAGNOSIS — M47816 Spondylosis without myelopathy or radiculopathy, lumbar region: Secondary | ICD-10-CM | POA: Diagnosis not present

## 2024-01-02 DIAGNOSIS — M4807 Spinal stenosis, lumbosacral region: Secondary | ICD-10-CM | POA: Diagnosis not present

## 2024-01-02 DIAGNOSIS — G5603 Carpal tunnel syndrome, bilateral upper limbs: Secondary | ICD-10-CM | POA: Diagnosis not present

## 2024-01-02 DIAGNOSIS — E1159 Type 2 diabetes mellitus with other circulatory complications: Secondary | ICD-10-CM | POA: Diagnosis not present

## 2024-01-04 DIAGNOSIS — I480 Paroxysmal atrial fibrillation: Secondary | ICD-10-CM

## 2024-01-05 ENCOUNTER — Other Ambulatory Visit (INDEPENDENT_AMBULATORY_CARE_PROVIDER_SITE_OTHER): Payer: Medicare Other

## 2024-01-05 DIAGNOSIS — E1159 Type 2 diabetes mellitus with other circulatory complications: Secondary | ICD-10-CM

## 2024-01-05 DIAGNOSIS — I10 Essential (primary) hypertension: Secondary | ICD-10-CM | POA: Diagnosis not present

## 2024-01-05 DIAGNOSIS — E7800 Pure hypercholesterolemia, unspecified: Secondary | ICD-10-CM

## 2024-01-05 DIAGNOSIS — Z79899 Other long term (current) drug therapy: Secondary | ICD-10-CM | POA: Diagnosis not present

## 2024-01-05 LAB — CBC WITH DIFFERENTIAL/PLATELET
Basophils Absolute: 0 10*3/uL (ref 0.0–0.1)
Basophils Relative: 0.4 % (ref 0.0–3.0)
Eosinophils Absolute: 0.1 10*3/uL (ref 0.0–0.7)
Eosinophils Relative: 2.3 % (ref 0.0–5.0)
HCT: 44.8 % (ref 36.0–46.0)
Hemoglobin: 14.9 g/dL (ref 12.0–15.0)
Lymphocytes Relative: 24.8 % (ref 12.0–46.0)
Lymphs Abs: 1.6 10*3/uL (ref 0.7–4.0)
MCHC: 33.3 g/dL (ref 30.0–36.0)
MCV: 99.8 fL (ref 78.0–100.0)
Monocytes Absolute: 0.4 10*3/uL (ref 0.1–1.0)
Monocytes Relative: 6.8 % (ref 3.0–12.0)
Neutro Abs: 4.3 10*3/uL (ref 1.4–7.7)
Neutrophils Relative %: 65.7 % (ref 43.0–77.0)
Platelets: 123 10*3/uL — ABNORMAL LOW (ref 150.0–400.0)
RBC: 4.49 Mil/uL (ref 3.87–5.11)
RDW: 14 % (ref 11.5–15.5)
WBC: 6.6 10*3/uL (ref 4.0–10.5)

## 2024-01-05 MED ORDER — APIXABAN 5 MG PO TABS: 5.0000 mg | ORAL_TABLET | Freq: Two times a day (BID) | ORAL | 2 refills | Status: DC

## 2024-01-05 MED ORDER — PROPRANOLOL HCL ER 80 MG PO CP24: 80.0000 mg | ORAL_CAPSULE | Freq: Two times a day (BID) | ORAL | 0 refills | Status: DC

## 2024-01-05 NOTE — Telephone Encounter (Signed)
 Prescription refill request for Eliquis  received. Indication: Afib  Last office visit: 12/24/22 Jerelene Monday)  Scr: 0.84 (05/14/23)  Age: 68 Weight: 85.3kg  Office visit overdue. Pt has scheduled appt on 01/06/24. Appropriate dose. Refill sent.

## 2024-01-05 NOTE — Addendum Note (Signed)
 Addended by: Natividad Balding B on: 01/05/2024 08:24 AM   Modules accepted: Orders

## 2024-01-06 ENCOUNTER — Ambulatory Visit: Payer: Medicare Other | Admitting: Cardiology

## 2024-01-06 NOTE — Progress Notes (Deleted)
 Cardiology Office Note:  .   Date:  01/06/2024  ID:  Kathryn Friedman, DOB 1956-01-02, MRN [id number] PCP: Dellar Fenton, MD  [facility] Providers Cardiologist:  None { Click to update primary MD,subspecialty MD or APP then REFRESH:1}   History of Present Illness: .   Kathryn Friedman is a 69 y.o. female with a past medical history of coronary artery disease (remote stenting 2007, last 2019 with multivessel disease managed medically), PAF, SVT, COPD, smoker, hyperlipidemia, questionable history of DVT, minimal carotid disease (2021) who is presenting today for follow-up of her coronary artery disease.   Patient previously undergone left heart catheterization for coronary artery disease in 2007 where she had two stents successfully placed then again in June 2019 that showed moderate to severe proximal to mid RCA disease occluded distal RCA with collaterals left to right, mid LAD and circumflex disease.  Echocardiogram revealed an LVEF of 55% with mild MR and TR.  Carotid ultrasound in 9 of 2021 showed minimal atherosclerotic plaque bilaterally resulting in less than 50% stenosis of bilateral ICAs.  ZIO monitor showed frequent episodes of atrial fibrillation that were noted with a 3% burden.  Note indicated history of DVT.  This was in June 13, 2021 she was started on apixaban  2.5 mg twice daily by orthopedics following hip replacement surgery.  She was evaluated in March 2023 showed an episode of near syncope in the shower.  Repeat echocardiogram completed in April 2024 revealed an LVEF of 60 to 60%, no RWMA, left atrial size was mildly dilated, mild MR.   She was last seen in clinic 12/14/2022 by Dr. Gollan.  At that time she had complaints of shortness of breath and chest pain mostly with activity.  She was in normal sinus rhythm on apixaban  long discussion concerning other options given the price of her apixaban  and was continuing to tolerate propranolol  80 mg twice daily.  She was to work  on her smoking cessation.  She denied any angina at her visit and not required the use of nitro.  There was no further workup that was needed at that time.  She returns to clinic today  ROS: 10 point review of systems has been reviewed and considered negative except what is been listed in the HPI  Studies Reviewed: .       2D echo 03/25/2023 1. Left ventricular ejection fraction, by estimation, is 60 to 65%. The  left ventricle has normal function. The left ventricle has no regional  wall motion abnormalities. Left ventricular diastolic parameters were  normal. The average left ventricular  global longitudinal strain is -14.0 %.   2. Right ventricular systolic function is normal. The right ventricular  size is normal. There is normal pulmonary artery systolic pressure. The  estimated right ventricular systolic pressure is 35.7 mmHg.   3. Left atrial size was mildly dilated.   4. The mitral valve is normal in structure. Mild mitral valve  regurgitation. No evidence of mitral stenosis.   5. The aortic valve is tricuspid. Aortic valve regurgitation is not  visualized. No aortic stenosis is present.   6. The inferior vena cava is normal in size with greater than 50%  respiratory variability, suggesting right atrial pressure of 3 mmHg.   Risk Assessment/Calculations:    CHA2DS2-VASc Score = 4  {Confirm score is correct.  If not, click here to update score.  REFRESH note.  :1} This indicates a 4.8% annual risk of stroke. The patient's score is based  upon: CHF History: 0 HTN History: 1 Diabetes History: 0 Stroke History: 0 Vascular Disease History: 1 Age Score: 1 Gender Score: 1   {This patient has a significant risk of stroke if diagnosed with atrial fibrillation.  Please consider VKA or DOAC agent for anticoagulation if the bleeding risk is acceptable.   You can also use the SmartPhrase .[email or web address] for documentation.   :[id number]} No BP recorded.  {Refresh Note OR Click here to  enter BP  :1}***       Physical Exam:   VS:  There were no vitals taken for this visit.   Wt Readings from Last 3 Encounters:  10/20/23 188 lb (85.3 kg)  09/26/23 188 lb (85.3 kg)  07/14/23 204 lb 12.8 oz (92.9 kg)    GEN: Well nourished, well developed in no acute distress NECK: No JVD; No carotid bruits CARDIAC: ***RRR, no murmurs, rubs, gallops RESPIRATORY:  Clear to auscultation without rales, wheezing or rhonchi  ABDOMEN: Soft, non-tender, non-distended EXTREMITIES:  No edema; No deformity   ASSESSMENT AND PLAN: .   Paroxysmal atrial fibrillation Coronary artery disease with stable angina Current smoker Essential hypertension Hypercholesterolemia Carotid artery disease    {Are you ordering a CV Procedure (e.g. stress test, cath, DCCV, TEE, etc)?   Press F2        :[id number]}  Dispo: ***  Signed, Brinna Divelbiss, NP  This encounter was created in error - please disregard.This encounter was created in error - please disregard.

## 2024-01-08 ENCOUNTER — Encounter: Payer: Self-pay | Admitting: Urology

## 2024-01-08 ENCOUNTER — Encounter: Payer: Self-pay | Admitting: Internal Medicine

## 2024-01-09 ENCOUNTER — Other Ambulatory Visit: Payer: Self-pay

## 2024-01-09 DIAGNOSIS — D696 Thrombocytopenia, unspecified: Secondary | ICD-10-CM

## 2024-01-09 NOTE — Telephone Encounter (Signed)
See result note.

## 2024-01-10 ENCOUNTER — Other Ambulatory Visit: Payer: Self-pay | Admitting: Internal Medicine

## 2024-01-10 DIAGNOSIS — D696 Thrombocytopenia, unspecified: Secondary | ICD-10-CM

## 2024-01-10 NOTE — Progress Notes (Signed)
Order placed for abdominal ultrasound.   

## 2024-01-13 ENCOUNTER — Other Ambulatory Visit: Payer: Self-pay | Admitting: Surgery

## 2024-01-14 ENCOUNTER — Encounter: Payer: Self-pay | Admitting: Urology

## 2024-01-14 ENCOUNTER — Ambulatory Visit (INDEPENDENT_AMBULATORY_CARE_PROVIDER_SITE_OTHER): Payer: Medicare Other | Admitting: Nurse Practitioner

## 2024-01-14 NOTE — Progress Notes (Deleted)
 Cardiology Office Note:  .   Date:  01/14/2024  ID:  [name], DOB May 12, 1956, MRN [id number] PCP: Dale Oologah, MD  [facility] Providers Cardiologist:  None { Click to update primary MD,subspecialty MD or APP then REFRESH:1}   History of Present Illness: .   NIKIAH GOIN is a 68 y.o. female  with a past medical history of coronary artery disease (remote stenting 2007, last 2019 with multivessel disease managed medically), PAF, SVT, COPD, smoker, hyperlipidemia, questionable history of DVT, minimal carotid disease (2021) who is presenting today for follow-up of her coronary artery disease.    Patient previously undergone left heart catheterization for coronary artery disease in 2007 where she had two stents successfully placed then again in June 2019 that showed moderate to severe proximal to mid RCA disease occluded distal RCA with collaterals left to right, mid LAD and circumflex disease.  Echocardiogram revealed an LVEF of 55% with mild MR and TR.  Carotid ultrasound in 9 of 2021 showed minimal atherosclerotic plaque bilaterally resulting in less than 50% stenosis of bilateral ICAs.  ZIO monitor showed frequent episodes of atrial fibrillation that were noted with a 3% burden.  Note indicated history of DVT.  This was in June 13, 2021 she was started on apixaban 2.5 mg twice daily by orthopedics following hip replacement surgery.  She was evaluated in March 2023 showed an episode of near syncope in the shower.  Repeat echocardiogram completed in April 2024 revealed an LVEF of 60 to 60%, no RWMA, left atrial size was mildly dilated, mild MR.   She was last seen in clinic 12/14/2022 by Dr. Mariah Milling.  At that time she had complaints of shortness of breath and chest pain mostly with activity.  She was in normal sinus rhythm on apixaban long discussion concerning other options given the price of her apixaban and was continuing to tolerate propranolol 80 mg twice daily.  She was to work  on her smoking cessation.  She denied any angina at her visit and not required the use of nitro.  There was no further workup that was needed at that time.   She returns to clinic today  ROS: 10 point review of systems has been reviewed and considered negative with exception of what is been listed in the HPI  Studies Reviewed: .        2D echo 03/25/2023 1. Left ventricular ejection fraction, by estimation, is 60 to 65%. The  left ventricle has normal function. The left ventricle has no regional  wall motion abnormalities. Left ventricular diastolic parameters were  normal. The average left ventricular  global longitudinal strain is -14.0 %.   2. Right ventricular systolic function is normal. The right ventricular  size is normal. There is normal pulmonary artery systolic pressure. The  estimated right ventricular systolic pressure is 35.7 mmHg.   3. Left atrial size was mildly dilated.   4. The mitral valve is normal in structure. Mild mitral valve  regurgitation. No evidence of mitral stenosis.   5. The aortic valve is tricuspid. Aortic valve regurgitation is not  visualized. No aortic stenosis is present.   6. The inferior vena cava is normal in size with greater than 50%  respiratory variability, suggesting right atrial pressure of 3 mmHg.  Risk Assessment/Calculations:    CHA2DS2-VASc Score = 4  {Confirm score is correct.  If not, click here to update score.  REFRESH note.  :1} This indicates a 4.8% annual risk of stroke.  The patient's score is based upon: CHF History: 0 HTN History: 1 Diabetes History: 0 Stroke History: 0 Vascular Disease History: 1 Age Score: 1 Gender Score: 1   {This patient has a significant risk of stroke if diagnosed with atrial fibrillation.  Please consider VKA or DOAC agent for anticoagulation if the bleeding risk is acceptable.   You can also use the SmartPhrase .[email or web address] for documentation.   :[id number]} No BP recorded.  {Refresh Note OR  Click here to enter BP  :1}***       Physical Exam:   VS:  There were no vitals taken for this visit.   Wt Readings from Last 3 Encounters:  10/20/23 188 lb (85.3 kg)  09/26/23 188 lb (85.3 kg)  07/14/23 204 lb 12.8 oz (92.9 kg)    GEN: Well nourished, well developed in no acute distress NECK: No JVD; No carotid bruits CARDIAC: ***RRR, no murmurs, rubs, gallops RESPIRATORY:  Clear to auscultation without rales, wheezing or rhonchi  ABDOMEN: Soft, non-tender, non-distended EXTREMITIES:  No edema; No deformity   ASSESSMENT AND PLAN: .   ***    {Are you ordering a CV Procedure (e.g. stress test, cath, DCCV, TEE, etc)?   Press F2        :[id number]}  Dispo: ***  Signed, Rosalia Mcavoy, NP

## 2024-01-15 ENCOUNTER — Other Ambulatory Visit: Payer: Self-pay

## 2024-01-15 ENCOUNTER — Encounter: Payer: Self-pay | Admitting: Internal Medicine

## 2024-01-15 ENCOUNTER — Ambulatory Visit (INDEPENDENT_AMBULATORY_CARE_PROVIDER_SITE_OTHER): Payer: Medicare Other | Admitting: Nurse Practitioner

## 2024-01-15 DIAGNOSIS — N3941 Urge incontinence: Secondary | ICD-10-CM

## 2024-01-15 DIAGNOSIS — N3281 Overactive bladder: Secondary | ICD-10-CM

## 2024-01-15 MED ORDER — MIRABEGRON ER 50 MG PO TB24: 50.0000 mg | ORAL_TABLET | Freq: Every day | ORAL | 11 refills | Status: DC

## 2024-01-15 NOTE — Telephone Encounter (Signed)
 Appt scheduled, pt aware

## 2024-01-16 ENCOUNTER — Ambulatory Visit: Payer: Medicare Other

## 2024-01-16 ENCOUNTER — Ambulatory Visit: Payer: Medicare Other | Admitting: Cardiology

## 2024-01-16 ENCOUNTER — Other Ambulatory Visit: Payer: Self-pay | Admitting: Student

## 2024-01-16 DIAGNOSIS — M5416 Radiculopathy, lumbar region: Secondary | ICD-10-CM

## 2024-01-16 DIAGNOSIS — M4807 Spinal stenosis, lumbosacral region: Secondary | ICD-10-CM

## 2024-01-16 DIAGNOSIS — M47816 Spondylosis without myelopathy or radiculopathy, lumbar region: Secondary | ICD-10-CM

## 2024-01-19 ENCOUNTER — Encounter: Payer: Self-pay | Admitting: Surgery

## 2024-01-19 ENCOUNTER — Other Ambulatory Visit: Payer: Self-pay

## 2024-01-19 ENCOUNTER — Encounter
Admission: RE | Admit: 2024-01-19 | Discharge: 2024-01-19 | Disposition: A | Discharge status: Home / Self Care | Payer: Medicare Other | Source: Ambulatory Visit | Attending: Surgery | Admitting: Surgery

## 2024-01-19 VITALS — Ht 64.0 in | Wt 184.0 lb

## 2024-01-19 DIAGNOSIS — I700 Atherosclerosis of aorta: Secondary | ICD-10-CM

## 2024-01-19 DIAGNOSIS — Z01812 Encounter for preprocedural laboratory examination: Secondary | ICD-10-CM

## 2024-01-19 DIAGNOSIS — E1159 Type 2 diabetes mellitus with other circulatory complications: Secondary | ICD-10-CM

## 2024-01-19 HISTORY — DX: Paroxysmal atrial fibrillation: I48.0

## 2024-01-19 HISTORY — DX: Thrombocytopenia, unspecified: D69.6

## 2024-01-19 HISTORY — DX: Radiculopathy, cervical region: M54.12

## 2024-01-19 NOTE — Pre-Procedure Instructions (Signed)
 Patient was instructed to call Dr. Windell Hummingbird office to get a recommendation for eliquis for surgery purposes. Patient  agreed and verbalized understanding.

## 2024-01-19 NOTE — Patient Instructions (Addendum)
 Your procedure is scheduled on: March 03/2024, Wednesday  Report to the Registration Desk on the 1st floor of the [facility] To find out your arrival time, please call [phone] between 1PM - 3PM on: Tuesday, 01/27/2024  If your arrival time is 6:00 am, do not arrive before that time as the [facility] entrance doors do not open until 6:00 am.  REMEMBER: Instructions that are not followed completely may result in serious medical risk, up to and including death; or upon the discretion of your surgeon and anesthesiologist your surgery may need to be rescheduled.  Do not eat food after midnight the night before surgery.  No gum chewing or hard candies.  You may however, drink CLEAR liquids up to 2 hours before you are scheduled to arrive for your surgery. Do not drink anything within 2 hours of your scheduled arrival time.  Clear liquids include: - bottled water     In addition, your doctor has ordered for you to drink the provided:  Gatorade G2 Drinking this carbohydrate drink up to two hours before surgery helps to reduce insulin resistance and improve patient outcomes. Please complete drinking 2 hours before scheduled arrival time.  One week prior to surgery: Stop Anti-inflammatories (NSAIDS) such as Advil, Aleve, Ibuprofen, Motrin, Naproxen, Naprosyn and Aspirin based products such as Excedrin, Goody's Powder, BC Powder. Stop ANY OVER THE COUNTER supplements until after surgery like;  CALCIUM PO,(VITAMIN D3),Cyanocobalamin (B-12),                           Multiple Vitamin.  You may however, continue to take Tylenol if needed for pain up until the day of surgery.   Ozempic-Last dose of Ozempic is  Today 01/19/2024. Do not take it within 7 days prior to surgery per Anesthesia protocol.  Please call Dr. Windell Hummingbird office and ask them a recommendation for Eliquis. Please remember the last dose  they have given you!   Continue taking all of your other prescription medications up  until the day of surgery.  ON THE DAY OF SURGERY ONLY TAKE THESE MEDICATIONS WITH SIPS OF WATER:  amLODipine (NORVASC) mirabegron ER (MYRBETRIQ) isosorbide mononitrate (IMDUR) DULoxetine (CYMBALTA) pantoprazole (PROTONIX) pregabalin (LYRICA) rOPINIRole (REQUIP) propranolol ER (INDERAL LA) trimethoprim (TRIMPEX) Vibegron (GEMTESA)   Do Not to take on day of surgery.     Telmisartan (MICARDIS)  Use Bretzi  inhaler  at home  on the day of surgery and bring albuterol to the hospital.   No Alcohol for 24 hours before or after surgery.  No Smoking including e-cigarettes for 24 hours before surgery.  No chewable tobacco products for at least 6 hours before surgery.  No nicotine patches on the day of surgery.  Do not use any "recreational" drugs for at least a week (preferably 2 weeks) before your surgery.  Please be advised that the combination of cocaine and anesthesia may have negative outcomes, up to and including death. If you test positive for cocaine, your surgery will be cancelled.  On the morning of surgery brush your teeth with toothpaste and water, you may rinse your mouth with mouthwash if you wish. Do not swallow any toothpaste or mouthwash.  Use CHG Soap or wipes as directed on instruction sheet.  Do not wear jewelry, make-up, hairpins, clips or nail polish.  For welded (permanent) jewelry: bracelets, anklets, waist bands, etc.  Please have this removed prior to surgery.  If it is not removed, there is  a chance that hospital personnel will need to cut it off on the day of surgery.  Do not wear lotions, powders, or perfumes.   Do not shave body hair from the neck down 48 hours before surgery.  Contact lenses, hearing aids and dentures may not be worn into surgery.  Do not bring valuables to the hospital. [facility] is not responsible for any missing/lost belongings or valuables.   Notify your doctor if there is any change in your medical condition (cold, fever,  infection).  Wear comfortable clothing (specific to your surgery type) to the hospital.  After surgery, you can help prevent lung complications by doing breathing exercises.  Take deep breaths and cough every 1-2 hours. Your doctor may order a device called an Incentive Spirometer to help you take deep breaths. If you are being admitted to the hospital overnight, leave your suitcase in the car. After surgery it may be brought to your room.   If you are being discharged the day of surgery, you will not be allowed to drive home. You will need a responsible individual to drive you home and stay with you for 24 hours after surgery.    Please call the Pre-admissions Testing Dept. at [phone] if you have any questions about these instructions.  Surgery Visitation Policy:  Patients having surgery or a procedure may have two visitors.  Children under the age of 6 must have an adult with them who is not the patient.  Temporary Visitor Restrictions Due to increasing cases of flu, RSV and COVID-19: Children ages 101 and under will not be able to visit patients in [facility] hospitals under most circumstances.      Preparing for Surgery with CHLORHEXIDINE GLUCONATE (CHG) Soap  Chlorhexidine Gluconate (CHG) Soap  o An antiseptic cleaner that kills germs and bonds with the skin to continue killing germs even after washing  o Used for showering the night before surgery and morning of surgery  Before surgery, you can play an important role by reducing the number of germs on your skin.  CHG (Chlorhexidine gluconate) soap is an antiseptic cleanser which kills germs and bonds with the skin to continue killing germs even after washing.  Please do not use if you have an allergy to CHG or antibacterial soaps. If your skin becomes reddened/irritated stop using the CHG.  1. Shower the NIGHT BEFORE SURGERY and the MORNING OF SURGERY with CHG soap.  2. If you choose to wash your hair, wash  your hair first as usual with your normal shampoo.  3. After shampooing, rinse your hair and body thoroughly to remove the shampoo.  4. Use CHG as you would any other liquid soap. You can apply CHG directly to the skin and wash gently with a scrungie or a clean washcloth.  5. Apply the CHG soap to your body only from the neck down. Do not use on open wounds or open sores. Avoid contact with your eyes, ears, mouth, and genitals (private parts). Wash face and genitals (private parts) with your normal soap.  6. Wash thoroughly, paying special attention to the area where your surgery will be performed.  7. Thoroughly rinse your body with warm water.  8. Do not shower/wash with your normal soap after using and rinsing off the CHG soap.  9. Pat yourself dry with a clean towel.  10. Wear clean pajamas to bed the night before surgery.  12. Place clean sheets on your bed the night of your first  shower and do not sleep with pets.  13. Shower again with the CHG soap on the day of surgery prior to arriving at the hospital.  14. Do not apply any deodorants/lotions/powders.  15. Please wear clean clothes to the hospital.

## 2024-01-20 ENCOUNTER — Telehealth: Payer: Self-pay | Admitting: Cardiology

## 2024-01-20 ENCOUNTER — Encounter: Payer: Self-pay | Admitting: Urgent Care

## 2024-01-20 ENCOUNTER — Telehealth: Payer: Self-pay | Admitting: Cardiovascular Disease

## 2024-01-20 ENCOUNTER — Telehealth: Payer: Self-pay | Admitting: *Deleted

## 2024-01-20 NOTE — Telephone Encounter (Signed)
-----   Message from [facility] F sent at 01/20/2024  9:45 AM EST ----- Regarding: APPT FOR PREOP CLEARANCE :) I will send a message to [facility] scheduling team to see if they can get the pt in office appt for preop clearance in time for med hold as well.   Pt cancelled her appt she did have 01/16/24.   I will update surgeon office the pt needs appt in office.   Thank you  Okey Regal

## 2024-01-20 NOTE — Telephone Encounter (Signed)
-----   Message from Verlee Monte sent at 01/20/2024  8:51 AM EST ----- Regarding: Request for pre-operative cardiac clearance Request for pre-operative cardiac clearance:  1. What type of surgery is being performed?  CARPAL TUNNEL RELEASE ENDOSCOPIC  2. When is this surgery scheduled?  01/28/2024  3. Type of clearance being requested (medical, pharmacy, both)? MEDICAL   4. Are there any medications that need to be held prior to surgery? APIXABAN  5. Practice name and name of physician performing surgery?  Performing surgeon: Dr. Leron Croak, MD Requesting clearance: Kathryn Mulling, FNP-C    6. Anesthesia type (none, local, MAC, general)? GENERAL  7. What is the office phone and fax number?   Phone: [phone] Fax: [phone]  ATTENTION: Unable to create telephone message as per your standard workflow. Directed by [facility] providers to send requests for cardiac clearance to this pool for appropriate distribution to provider covering pre-operative clearances.   Kathryn Mulling, MSN, APRN, FNP-C, CEN [facility]  Peri-operative Services Nurse Practitioner Phone: [phone] 01/20/24 8:51 AM

## 2024-01-20 NOTE — Telephone Encounter (Signed)
   Name: Kathryn Friedman  DOB: 1956-10-26  MRN: [id number]  Primary Cardiologist: None  Chart reviewed as part of pre-operative protocol coverage. Because of Kathryn Friedman past medical history and time since last visit, she will require a follow-up in-office visit in order to better assess preoperative cardiovascular risk.  Pre-op covering staff: - Please schedule appointment and call patient to inform them. If patient already had an upcoming appointment within acceptable timeframe, please add "pre-op clearance" to the appointment notes so provider is aware. - Please contact requesting surgeon's office via preferred method (i.e, phone, fax) to inform them of need for appointment prior to surgery.  This message will also be routed to pharmacy pool  for input on holding Eliquis as requested below so that this information is available to the clearing provider at time of patient's appointment.   Napoleon Form, Leodis Rains, NP  01/20/2024, 9:29 AM

## 2024-01-20 NOTE — Telephone Encounter (Signed)
 Left voicemail, pt needs appt scheduled for pre op clearance.

## 2024-01-20 NOTE — Telephone Encounter (Signed)
 Left voicemail to schedule pre-op appt, please schedule.

## 2024-01-20 NOTE — Telephone Encounter (Signed)
   Pre-operative Risk Assessment    Patient Name: Kathryn Friedman  DOB: 13-Mar-1956 MRN: [id number]   Date of last office visit: 12/24/22 DR. GOLLAN Date of next office visit: NONE   Request for Surgical Clearance    Procedure:   CARPAL TUNNEL RELEASE ENDOSCOPIC  Date of Surgery:  Clearance 01/28/24                                Surgeon:  DR. Joice Lofts Surgeon's Group or Practice Name:  [facility]  Phone number:  [phone] Fax number:  [phone]   Type of Clearance Requested:   - Medical  - Pharmacy:  Hold Apixaban (Eliquis)     Type of Anesthesia:  General    Additional requests/questions:    Elpidio Anis   01/20/2024, 9:23 AM

## 2024-01-20 NOTE — Telephone Encounter (Signed)
 [name], [name] V7 minutes ago (12:11 PM)   PA Left voicemail to schedule pre-op appt, please schedule.      Note   Letica, Giaimo [phone] minutes ago (12:10 PM)   Lauralee Evener V8 minutes ago (12:10 PM)   PA ----- Message from [facility] F sent at 01/20/2024  9:45 AM EST ----- Regarding: APPT FOR PREOP CLEARANCE :) I will send a message to [facility] scheduling team to see if they can get the pt in office appt for preop clearance in time for med hold as well.    Pt cancelled her appt she did have 01/16/24.    I will update surgeon office the pt needs appt in office.    Thank you  Okey Regal        Note       Recent Patient Communication

## 2024-01-20 NOTE — Telephone Encounter (Signed)
 I will send a message to [facility] scheduling team to see if they can get the pt in office appt for preop clearance in time for med hold as well.   Pt cancelled her appt she did have 01/16/24.   I will update surgeon office the pt needs appt in office.

## 2024-01-20 NOTE — Telephone Encounter (Signed)
 Patient with diagnosis of afib on Eliquis for anticoagulation.    Procedure: CARPAL TUNNEL RELEASE ENDOSCOPIC  Date of procedure: 01/28/24   CHA2DS2-VASc Score = 4   This indicates a 4.8% annual risk of stroke. The patient's score is based upon: CHF History: 0 HTN History: 1 Diabetes History: 0 Stroke History: 0 Vascular Disease History: 1 Age Score: 1 Gender Score: 1      CrCl 67 ml/min Platelet count 123  Per office protocol, patient can hold Eliquis for 2 days prior to procedure.    **This guidance is not considered finalized until pre-operative APP has relayed final recommendations.**

## 2024-01-20 NOTE — Telephone Encounter (Signed)
 Per Dahlia Bailiff, RN: this patient called to pre-admission testing dept. saying that we needed to fax Dr. Windell Hummingbird office a clearance form. I seen that she cancelled her appt. with Dr. Mariah Milling. I told the patient that I thought she needed to be seen by the cardiologist before surgery and the patient said that she doesn't have time, she already has about 15 appts. I know that we see your clearance notes in epic so not sure if you really needed [place] to send you that form.   Me: she does need to be seen and the [place] office left her a message today. she need to call the [place] office [phone]   Dahlia Bailiff, RN: I called her; she will be calling you.  Me: she can call the scheduling team in [place] the # I gave you.

## 2024-01-21 NOTE — Telephone Encounter (Signed)
 I will update the surgeon office the pt is refusing to schedule an appt needed for preop clearance. Please see all notes.    I am going to remove from the preop call back pool as pt is refusing to make appt. I will also update the preop APP for today.   [name], [name] V2 minutes ago (8:34 AM)   PA Called patient to schedule pre-op appt. Patient states she cannot schedule she has too many appts already, she also said I've been through is before no need to see Doctor everything is fine. I told her that's required before her procedure she then hung up on me as I was talking.      Note   Kathryn Friedman, Kathryn Friedman [phone] minutes ago (8:29 AM)   Lauralee Evener V routed conversation to You; Cv Div Burl Scheduling20 hours ago (12:11 PM)   [name], [name] V20 hours ago (12:11 PM)   PA Left voicemail to schedule pre-op appt, please schedule.      Note   Kathryn Friedman, Kathryn Friedman [phone] hours ago (12:10 PM)   Lauralee Evener V20 hours ago (12:10 PM)   PA ----- Message from [facility] sent at 01/20/2024  9:45 AM EST ----- Regarding: APPT FOR PREOP CLEARANCE :) I will send a message to [facility] scheduling team to see if they can get the pt in office appt for preop clearance in time for med hold as well.    Pt cancelled her appt she did have 01/16/24.    I will update surgeon office the pt needs appt in office.    Thank you  Okey Regal

## 2024-01-21 NOTE — Telephone Encounter (Signed)
 Called patient to schedule pre-op appt. Patient states she cannot schedule she has too many appts already, she also said I've been through is before no need to see Doctor everything is fine. I told her that's required before her procedure she then hung up on me as I was talking.

## 2024-01-22 ENCOUNTER — Ambulatory Visit: Payer: Medicare Other

## 2024-01-22 ENCOUNTER — Other Ambulatory Visit: Payer: Medicare Other

## 2024-01-23 ENCOUNTER — Other Ambulatory Visit: Payer: Self-pay | Admitting: Cardiovascular Disease

## 2024-01-23 ENCOUNTER — Other Ambulatory Visit: Payer: Self-pay | Admitting: Internal Medicine

## 2024-01-26 ENCOUNTER — Telehealth: Payer: Self-pay | Admitting: Urology

## 2024-01-26 ENCOUNTER — Other Ambulatory Visit: Payer: Self-pay

## 2024-01-26 MED ORDER — TRIMETHOPRIM 100 MG PO TABS: 100.0000 mg | ORAL_TABLET | Freq: Every day | ORAL | 1 refills | Status: DC

## 2024-01-26 NOTE — Telephone Encounter (Signed)
Meds sent, pt informed.

## 2024-01-26 NOTE — Telephone Encounter (Signed)
 Patient called requesting RX refill for Trimethoprim to be sent to [facility] in [place]. Please advise patient.

## 2024-01-27 ENCOUNTER — Ambulatory Visit (INDEPENDENT_AMBULATORY_CARE_PROVIDER_SITE_OTHER): Payer: Medicare Other | Admitting: Nurse Practitioner

## 2024-01-27 ENCOUNTER — Encounter (INDEPENDENT_AMBULATORY_CARE_PROVIDER_SITE_OTHER): Payer: Self-pay

## 2024-01-28 ENCOUNTER — Telehealth: Payer: Self-pay

## 2024-01-28 ENCOUNTER — Ambulatory Visit: Payer: Medicare Other

## 2024-01-28 ENCOUNTER — Ambulatory Visit: Admit: 2024-01-28 | Payer: Medicare Other | Admitting: Surgery

## 2024-01-28 HISTORY — DX: Carpal tunnel syndrome, right upper limb: G56.01

## 2024-01-28 SURGERY — RELEASE, CARPAL TUNNEL, ENDOSCOPIC
Anesthesia: Choice | Site: Wrist | Laterality: Right

## 2024-01-28 NOTE — Telephone Encounter (Signed)
 Called to follow up with patient and remind her that patient assistance ozempic is at office. She will pick up the 14th.   See me about her in the morning. Not urgent.

## 2024-01-28 NOTE — Telephone Encounter (Signed)
 Noted.

## 2024-01-29 NOTE — Telephone Encounter (Signed)
Discussed with Dr. Lorin PicketScott

## 2024-01-31 ENCOUNTER — Encounter: Payer: Self-pay | Admitting: Internal Medicine

## 2024-02-02 NOTE — Telephone Encounter (Signed)
 Noted. Bethann Berkshire please call and confirm doing ok.  Let us know if needs anything or if needs information for assistance.

## 2024-02-03 NOTE — Telephone Encounter (Signed)
 Called and spoke with patient. She is going to have an appointment with Dr Elna Breslow tomorrow and discuss. She is having trouble finding a rehab center that takes her insurance. She also mentioned that a couple of places that she called does not do rehab for the drug she has been using. She does not want to go anywhere outside of [place]. Discussed [facility]- patient is thinking about going over there because her brother went through the ED and was able to get help but she wants to wait and talk to Dr Elna Breslow first.

## 2024-02-04 ENCOUNTER — Ambulatory Visit: Payer: Medicare Other | Admitting: Psychiatry

## 2024-02-05 ENCOUNTER — Other Ambulatory Visit

## 2024-02-06 ENCOUNTER — Other Ambulatory Visit: Payer: Medicare Other

## 2024-02-09 DIAGNOSIS — Z79899 Other long term (current) drug therapy: Secondary | ICD-10-CM | POA: Diagnosis not present

## 2024-02-14 ENCOUNTER — Encounter: Payer: Self-pay | Admitting: Internal Medicine

## 2024-02-16 ENCOUNTER — Ambulatory Visit: Payer: Medicare Other | Admitting: Physician Assistant

## 2024-02-16 MED ORDER — NYSTATIN 100000 UNIT/GM EX POWD: Freq: Two times a day (BID) | CUTANEOUS | 0 refills | Status: DC

## 2024-02-16 NOTE — Telephone Encounter (Signed)
 Medication has been pended for approval.

## 2024-02-20 ENCOUNTER — Other Ambulatory Visit: Payer: Self-pay | Admitting: Student

## 2024-02-20 DIAGNOSIS — M5416 Radiculopathy, lumbar region: Secondary | ICD-10-CM

## 2024-02-20 DIAGNOSIS — M47816 Spondylosis without myelopathy or radiculopathy, lumbar region: Secondary | ICD-10-CM

## 2024-02-20 DIAGNOSIS — M4807 Spinal stenosis, lumbosacral region: Secondary | ICD-10-CM

## 2024-02-21 ENCOUNTER — Encounter: Payer: Self-pay | Admitting: Urology

## 2024-02-24 ENCOUNTER — Ambulatory Visit: Admission: RE | Admit: 2024-02-24 | Source: Ambulatory Visit

## 2024-02-24 ENCOUNTER — Telehealth: Payer: Self-pay | Admitting: Internal Medicine

## 2024-02-24 DIAGNOSIS — E1159 Type 2 diabetes mellitus with other circulatory complications: Secondary | ICD-10-CM

## 2024-02-24 DIAGNOSIS — I10 Essential (primary) hypertension: Secondary | ICD-10-CM

## 2024-02-24 DIAGNOSIS — E7800 Pure hypercholesterolemia, unspecified: Secondary | ICD-10-CM

## 2024-02-24 NOTE — Telephone Encounter (Signed)
 Patient need lab orders.

## 2024-02-24 NOTE — Addendum Note (Signed)
 Addended by: Rita Ohara D on: 02/24/2024 12:44 PM   Modules accepted: Orders

## 2024-02-24 NOTE — Telephone Encounter (Signed)
 Ordered

## 2024-02-25 ENCOUNTER — Other Ambulatory Visit

## 2024-03-01 DIAGNOSIS — Z79899 Other long term (current) drug therapy: Secondary | ICD-10-CM | POA: Diagnosis not present

## 2024-03-01 DIAGNOSIS — G4733 Obstructive sleep apnea (adult) (pediatric): Secondary | ICD-10-CM | POA: Diagnosis not present

## 2024-03-04 ENCOUNTER — Telehealth: Payer: Self-pay

## 2024-03-04 ENCOUNTER — Encounter: Payer: Self-pay | Admitting: Internal Medicine

## 2024-03-04 ENCOUNTER — Other Ambulatory Visit
Admission: RE | Admit: 2024-03-04 | Discharge: 2024-03-04 | Disposition: A | Discharge status: Home / Self Care | Source: Ambulatory Visit | Attending: Psychiatry | Admitting: Psychiatry

## 2024-03-04 ENCOUNTER — Telehealth (HOSPITAL_COMMUNITY): Payer: Self-pay | Admitting: Licensed Clinical Social Worker

## 2024-03-04 ENCOUNTER — Other Ambulatory Visit: Payer: Self-pay

## 2024-03-04 ENCOUNTER — Encounter: Payer: Self-pay | Admitting: Psychiatry

## 2024-03-04 ENCOUNTER — Ambulatory Visit (INDEPENDENT_AMBULATORY_CARE_PROVIDER_SITE_OTHER): Admitting: Internal Medicine

## 2024-03-04 ENCOUNTER — Ambulatory Visit (INDEPENDENT_AMBULATORY_CARE_PROVIDER_SITE_OTHER): Admitting: Psychiatry

## 2024-03-04 VITALS — BP 128/78 | HR 78 | Temp 97.7°F | Ht 64.0 in | Wt 188.6 lb

## 2024-03-04 VITALS — BP 110/68 | HR 74 | Temp 98.1°F | Resp 17 | Ht 64.0 in | Wt 188.5 lb

## 2024-03-04 DIAGNOSIS — R197 Diarrhea, unspecified: Secondary | ICD-10-CM

## 2024-03-04 DIAGNOSIS — I10 Essential (primary) hypertension: Secondary | ICD-10-CM | POA: Diagnosis not present

## 2024-03-04 DIAGNOSIS — J449 Chronic obstructive pulmonary disease, unspecified: Secondary | ICD-10-CM

## 2024-03-04 DIAGNOSIS — Z79899 Other long term (current) drug therapy: Secondary | ICD-10-CM

## 2024-03-04 DIAGNOSIS — F17200 Nicotine dependence, unspecified, uncomplicated: Secondary | ICD-10-CM

## 2024-03-04 DIAGNOSIS — F331 Major depressive disorder, recurrent, moderate: Secondary | ICD-10-CM

## 2024-03-04 DIAGNOSIS — F1420 Cocaine dependence, uncomplicated: Secondary | ICD-10-CM | POA: Insufficient documentation

## 2024-03-04 DIAGNOSIS — K219 Gastro-esophageal reflux disease without esophagitis: Secondary | ICD-10-CM

## 2024-03-04 DIAGNOSIS — E7800 Pure hypercholesterolemia, unspecified: Secondary | ICD-10-CM

## 2024-03-04 DIAGNOSIS — F330 Major depressive disorder, recurrent, mild: Secondary | ICD-10-CM | POA: Insufficient documentation

## 2024-03-04 DIAGNOSIS — I2510 Atherosclerotic heart disease of native coronary artery without angina pectoris: Secondary | ICD-10-CM

## 2024-03-04 DIAGNOSIS — I779 Disorder of arteries and arterioles, unspecified: Secondary | ICD-10-CM

## 2024-03-04 DIAGNOSIS — G2581 Restless legs syndrome: Secondary | ICD-10-CM | POA: Diagnosis not present

## 2024-03-04 DIAGNOSIS — E1159 Type 2 diabetes mellitus with other circulatory complications: Secondary | ICD-10-CM

## 2024-03-04 DIAGNOSIS — J849 Interstitial pulmonary disease, unspecified: Secondary | ICD-10-CM

## 2024-03-04 DIAGNOSIS — I480 Paroxysmal atrial fibrillation: Secondary | ICD-10-CM

## 2024-03-04 DIAGNOSIS — G4733 Obstructive sleep apnea (adult) (pediatric): Secondary | ICD-10-CM

## 2024-03-04 DIAGNOSIS — G4701 Insomnia due to medical condition: Secondary | ICD-10-CM

## 2024-03-04 DIAGNOSIS — F418 Other specified anxiety disorders: Secondary | ICD-10-CM | POA: Diagnosis not present

## 2024-03-04 DIAGNOSIS — I700 Atherosclerosis of aorta: Secondary | ICD-10-CM

## 2024-03-04 LAB — GLUCOSE, POCT (MANUAL RESULT ENTRY)
POC Glucose: 101 mg/dL — AB (ref 70–99)
POC Glucose: 98 mg/dL (ref 70–99)

## 2024-03-04 NOTE — Telephone Encounter (Signed)
 pt left message that she had labwork done today.  Pt was seen today

## 2024-03-04 NOTE — Telephone Encounter (Signed)
 Patient is going to keep appt in person. She has labs and she needs to pick up pt assistance medication.

## 2024-03-04 NOTE — Telephone Encounter (Signed)
 Copied from CRM [id number]. Topic: General - Other >> Mar 04, 2024  8:03 AM [name] wrote: Reason for CRM: patient called wanting to see if she is able to do a virtual for her appointment today with Dr. Lorin Picket at 2:30pm. Patient stated she hasn't has any sleep and she also has another appointment today at 11am and need to know before then
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
## 2024-03-04 NOTE — Telephone Encounter (Signed)
 Noted.

## 2024-03-04 NOTE — Telephone Encounter (Signed)
 The therapist reaches out to [place] at the request of Dr. Elna Breslow. Kathryn Friedman says that she is unable to talk as she is just leaving her 2nd doctor's appointment today; however, she takes down this therapist's number agreeing to call him back tomorrow.  She says that she used to see [name] for therapy at [facility] and that she still sees her virtually as Ms. Bounds now has her own practice.  Myrna Blazer, MA, LCSW, [facility], LCAS 03/04/2024

## 2024-03-04 NOTE — Telephone Encounter (Signed)
 See phone note
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
## 2024-03-04 NOTE — Telephone Encounter (Signed)
 Pt assistance meds picked up at office visit today.

## 2024-03-05 ENCOUNTER — Telehealth (HOSPITAL_COMMUNITY): Payer: Self-pay | Admitting: Licensed Clinical Social Worker
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
## 2024-03-07 ENCOUNTER — Encounter: Payer: Self-pay | Admitting: Internal Medicine

## 2024-03-07 NOTE — Assessment & Plan Note (Signed)
 Continues to smoke. Have discussed.

## 2024-03-07 NOTE — Assessment & Plan Note (Signed)
 On crestor.  Low cholesterol diet and exercise.  Follow lipid panel and liver function tests.

## 2024-03-07 NOTE — Assessment & Plan Note (Signed)
 CPAP. Followed by pulmonary.

## 2024-03-07 NOTE — Assessment & Plan Note (Signed)
 Low carb diet and exercise.  Follow met b and a1c. Continue GLP - 1 agonist. Follow sugars and send in readings. Follow met b and A1c. No reported problems with low sugars this visit.

## 2024-03-07 NOTE — Assessment & Plan Note (Signed)
 Continue micardis 80mg  q day and amlodipine 5mg  q day.  Dr Gollan increased propranolol. Blood pressures have been under reasonable control. Follow pressures. Follow metabolic panel. No changes today.

## 2024-03-07 NOTE — Assessment & Plan Note (Signed)
 Persistent loose stool as outlined. Brought in stool sample.

## 2024-03-07 NOTE — Assessment & Plan Note (Signed)
 Continue lipitor  ?

## 2024-03-07 NOTE — Assessment & Plan Note (Signed)
 Discussed with her today. She saw Dr Tere Felts and discussed with her today as outlined.  Planning intensive outpatient program. She is ready to quit. Regrets starting. Has told her family. Discussed with her today. She is not going to go by and pick up supply today. Follow.

## 2024-03-07 NOTE — Assessment & Plan Note (Signed)
Breztri.  Breathing stable.  

## 2024-03-07 NOTE — Assessment & Plan Note (Signed)
 Being followed by psychiatry.  On cymbalta. Just evaluated today.

## 2024-03-07 NOTE — Assessment & Plan Note (Signed)
No upper symptoms reported.  On protonix.   

## 2024-03-07 NOTE — Assessment & Plan Note (Signed)
 S/p stent placement.  Continue lipitor. Followed by cardiology.  On eliquis.  Continue f/u with cardiology. Stable.

## 2024-03-07 NOTE — Assessment & Plan Note (Signed)
 Sees Dr Jamal Mays.  Using breztri.  Breathing stable.  Dr Jamal Mays recommended f/u chest CT in 02/2023 -. Lung-RADS 2S, benign appearance or behavior. Continue annual screening with low-dose chest CT without contrast in 12 months.  Mild diffuse bronchial wall thickening with mild centrilobular and paraseptal emphysema; imaging findings suggestive of underlying COPD. Breathing stable.

## 2024-03-07 NOTE — Assessment & Plan Note (Signed)
 On eliquis currently. Stable.  Continue f/u with cardiology as outlined. No changes today.

## 2024-03-09 ENCOUNTER — Other Ambulatory Visit

## 2024-03-09 ENCOUNTER — Ambulatory Visit: Admission: RE | Admit: 2024-03-09 | Source: Ambulatory Visit

## 2024-03-10 ENCOUNTER — Other Ambulatory Visit: Payer: Self-pay | Admitting: Cardiovascular Disease

## 2024-03-10 ENCOUNTER — Other Ambulatory Visit: Payer: Self-pay | Admitting: Student

## 2024-03-10 ENCOUNTER — Ambulatory Visit: Admission: RE | Admit: 2024-03-10 | Source: Ambulatory Visit

## 2024-03-10 DIAGNOSIS — Z96642 Presence of left artificial hip joint: Secondary | ICD-10-CM

## 2024-03-10 DIAGNOSIS — M7062 Trochanteric bursitis, left hip: Secondary | ICD-10-CM

## 2024-03-10 LAB — GASTROINTESTINAL PATHOGEN PNL

## 2024-03-11 ENCOUNTER — Ambulatory Visit (HOSPITAL_COMMUNITY): Admitting: Licensed Clinical Social Worker

## 2024-03-11 ENCOUNTER — Encounter (HOSPITAL_COMMUNITY): Payer: Self-pay

## 2024-03-12 LAB — COCAINE CONF, UR
Benzoylecgonine GC/MS Conf: >5000 ng/mL
Cocaine Metab Quant, Ur: POSITIVE — AB

## 2024-03-12 LAB — URINE DRUGS OF ABUSE SCREEN W ALC, ROUTINE (REF LAB)
Amphetamines, Urine: NEGATIVE ng/mL
Barbiturate, Ur: NEGATIVE ng/mL
Benzodiazepine Quant, Ur: NEGATIVE ng/mL
Cannabinoid Quant, Ur: NEGATIVE ng/mL
Creatinine, Urine: 147.5 mg/dL (ref 20.0–300.0)
Ethanol U, Quan: NEGATIVE %
Methadone Screen, Urine: NEGATIVE ng/mL
Nitrite Urine, Quantitative: NEGATIVE ug/mL
OPIATE SCREEN URINE: NEGATIVE ng/mL
Phencyclidine, Ur: NEGATIVE ng/mL
Propoxyphene, Urine: NEGATIVE ng/mL
pH, Urine: 5.3 (ref 4.5–8.9)

## 2024-03-13 ENCOUNTER — Ambulatory Visit

## 2024-03-15 ENCOUNTER — Other Ambulatory Visit: Payer: Self-pay | Admitting: Psychiatry

## 2024-03-15 DIAGNOSIS — G2581 Restless legs syndrome: Secondary | ICD-10-CM

## 2024-03-17 ENCOUNTER — Ambulatory Visit: Admission: RE | Admit: 2024-03-17 | Source: Ambulatory Visit

## 2024-03-17 ENCOUNTER — Ambulatory Visit (INDEPENDENT_AMBULATORY_CARE_PROVIDER_SITE_OTHER): Payer: Medicare Other | Admitting: *Deleted

## 2024-03-17 ENCOUNTER — Ambulatory Visit

## 2024-03-17 VITALS — Ht 64.0 in | Wt 184.0 lb

## 2024-03-17 DIAGNOSIS — Z0000 Encounter for general adult medical examination without abnormal findings: Secondary | ICD-10-CM

## 2024-03-17 NOTE — Patient Instructions (Signed)
 Kathryn Friedman , Thank you for taking time to come for your Medicare Wellness Visit. I appreciate your ongoing commitment to your health goals. Please review the following plan we discussed and let me know if I can assist you in the future.   Referrals/Orders/Follow-Ups/Clinician Recommendations: Remember to update your covid and pneumonia vaccines. Call and schedule your diabetic eye exam and reschedule your lung cancer screening.  This is a list of the screening recommended for you and due dates:  Health Maintenance  Topic Date Due   Pneumonia Vaccine (2 of 2 - PCV) 09/23/2014   Complete foot exam   03/10/2023   Eye exam for diabetics  04/09/2023   Hemoglobin A1C  11/13/2023   Yearly kidney health urinalysis for diabetes  12/21/2023   Screening for Lung Cancer  03/09/2024   COVID-19 Vaccine (4 - 2024-25 season) 09/25/2024*   Mammogram  05/11/2024   Yearly kidney function blood test for diabetes  05/13/2024   Flu Shot  06/25/2024   Medicare Annual Wellness Visit  03/17/2025   DTaP/Tdap/Td vaccine (2 - Tdap) 04/21/2028   Colon Cancer Screening  03/29/2030   DEXA scan (bone density measurement)  Completed   Hepatitis C Screening  Completed   Zoster (Shingles) Vaccine  Completed   HPV Vaccine  Aged Out   Meningitis B Vaccine  Aged Out  *Topic was postponed. The date shown is not the original due date.    Advanced directives: (Copy Requested) Please bring a copy of your health care power of attorney and living will to the office to be added to your chart at your convenience. You can mail to [facility] [address] [place], [place] [address] or email to [email or web address]  Next Medicare Annual Wellness Visit scheduled for next year: Yes 03/25/25 @ 9:30
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
## 2024-03-18 ENCOUNTER — Ambulatory Visit (INDEPENDENT_AMBULATORY_CARE_PROVIDER_SITE_OTHER): Admitting: Licensed Clinical Social Worker

## 2024-03-18 DIAGNOSIS — F4310 Post-traumatic stress disorder, unspecified: Secondary | ICD-10-CM

## 2024-03-18 DIAGNOSIS — F1420 Cocaine dependence, uncomplicated: Secondary | ICD-10-CM

## 2024-03-18 DIAGNOSIS — F17200 Nicotine dependence, unspecified, uncomplicated: Secondary | ICD-10-CM

## 2024-03-18 DIAGNOSIS — F331 Major depressive disorder, recurrent, moderate: Secondary | ICD-10-CM

## 2024-03-18 NOTE — Progress Notes (Deleted)
   03/18/24 1029  Alcohol / Drug Use  Pain Medications None  Prescriptions Pregabalin, Eliquis , Propanolol, and Cymbatal  Over the Counter Tylenol  p.r.n. and vitamins and supplements  History of alcohol / drug use? Yes  Longest period of sobriety (when/how long) None as she did not start using cocaine  until June of last year  Negative Consequences of Use Financial;Personal relationships (not as close with son and her sister since she told them she is using cocaine )  Substance #1  Name of Substance 1 Cocaine   1 - Age of First Use 34  1 - Amount (size/oz) $100 per day  1 - Frequency just about every day  1 - Duration 9 months  1 - Last Use / Amount last night/$100 worth  1 - Method of Aquiring elicit  1- Route of Use smoking  Substance #2  Name of Substance 2 tobacco  2 - Age of First Use 16  2 - Amount (size/oz) pack  2 - Frequency daily  2 - Duration since age 15  2 - Last Use / Amount today  2 - Method of Aquiring legal  2 - Route of Substance Use smoking  ASAM Multidimensional Assessment Summary  Dimension 1:  Description of individual's past and current experiences of substance use and withdrawal smoking $100 worth of crack daily; no risk of a life threatening withdrawal  DImension 1:  Acute Intoxication and/or Withdrawal Potential Severity Rating 1  Dimension 2:  Description of patient's biomedical conditions and  complications Afib, GERD, COPD, Arthritis, Diabetes Type 2, HTN, Incontinence, and High Cholesterol  Dimension 2:  Biomedical Conditions and Complications Severity Rating 2  Dimension 3:  Description of emotional, behavioral, or cognitive conditions and complications moderately severe depression per PHQ-9  Dimension 3:  Emotional, behavioral or cognitive (EBC) conditions and complications severity rating 3  Dimension 4:  Description of Readiness to Change criteria She is willing to enter treatment but ambivalent about doing so saying that she does not want to quit but  knows that she needs to do so  Dimension 4:  Readiness to Change Severity Rating 1  Dimension 5:  Relapse, continued use, or continued problem potential critiera description no relapse prevention skills  Dimension 5:  Relapse, continued use, or continued problem potential severity rating 3  Dimension 6:  Recovery/Iiving environment criteria description lives alone but has a best friend, Nature conservation officer, who is supportive of Kathryn Friedman's stopping and offering to drive her to and from [facility]  Dimension 6:  Recovery/living environment severity rating 0  ASAM's Severity Rating Score 9  ASAM Recommended Level of Treatment Level II Intensive Outpatient Treatment  Substance Use Disorder (SUD)  Checklist  Symptoms of Substance Use Continued use despite having a persistent/recurrent physical/psychological problem caused/exacerbated by use;Continued use despite persistent or recurrent social, interpersonal problems, caused or exacerbated by use;Presence of craving or strong urge to use;Substance(s) often taken in larger amounts or over longer times than was intended;Recurrent use that results in a failure to fulfill major role obligations (work, school, home)  Recommendations for Services/Supports/Treatments  Recommendations For Services/Supports/Treatments CD-IOP Intensive Chemical Dependency Program

## 2024-03-18 NOTE — Progress Notes (Signed)
 THERAPIST PROGRESS NOTE  Session Time: 10:05 a.m. to 11:10 a.m.  Type of Therapy: Individual   Therapist Response/Interventions: Motivational Interviewing/The therapist assists Kona in weighing the pros and cons of continued use with the cons being death from a heart attack, jail, and a lack of income as she cannot sustain this habit financially.   Treatment Goals addressed:  Active     Anxiety     STG: Laraine will participate in at least 80% of scheduled individual psychotherapy sessions  (Completed/Met)     Start:  12/20/22    Expected End:  09/22/23    Resolved:  07/06/23      Anxiety  (Progressing)     Start:  12/20/22    Expected End:  09/22/23      Reduce overall frequency, intensity and duration of anxiety so that daily functioning is not impaired per pt self report 3 out of 5 sessions.        Work with Devra Fontana to track symptoms, triggers, and/or skill use through a mood chart, diary card, or journal     Start:  12/20/22         Perform motivational interviewing regarding physical activity     Start:  12/20/22         Encourage Devra Fontana to take psychotropic medication(s) as prescribed     Start:  12/20/22         Perform psychoeducation regarding anxiety disorders     Start:  12/20/22         Provide Devra Fontana with educational information and reading material on anxiety, its causes, and symptoms.      Start:  12/20/22         Create a weekly activity schedule     Start:  12/20/22         Perform motivational interviewing regarding use of tools     Start:  12/20/22         Anxiety      Start:  12/20/22      Will work with the pt using CBT/DBT/REBT techniques to help the pt verbalize an understanding of the cognitive, physiological, and behavioral components of anxiety and its treatment. This will be done by using worksheets, interactive activities, CBT/ABC thought logs, modeling, homework, role playing and journaling. Will work with pt to   learn and implement coping skills that result in a reduction of anxiety and improve daily functioning per pt self report 3 out of 5 documented sessions.           BH CCP Acute or Chronic Trauma Reaction     LTG: Recall traumatic events without becoming overwhelmed with negative emotions (Progressing)     Start:  12/20/22    Expected End:  09/22/23         Trauma  (Progressing)     Start:  12/20/22    Expected End:  09/22/23      Pt will explore coping skills and learn coping and grounding skills to reduce symptoms of PTSD and prepare to handle future stressful situations as evidenced by implementing coping skills per pt self-report 3 out of 5 documented sessions.        STG: Cataleya will describe the signs and symptoms of PTSD that are experienced and how they interfere with daily living (Progressing)     Start:  12/20/22    Expected End:  09/22/23         STG: Sammie will identify internal and external stimuli that trigger  PTSD symptoms (Completed/Met)     Start:  12/20/22    Expected End:  09/22/23    Resolved:  07/06/23      STG: Divina will acknowledge that healing from PTSD is a gradual process (Completed/Met)     Start:  12/20/22    Expected End:  09/22/23    Resolved:  05/28/23      STG: Devra Fontana will identify coping strategies to deal with trauma memories and the associated emotional reaction (Progressing)     Start:  12/20/22    Expected End:  09/22/23         Work with Devra Fontana to track symptoms, triggers, and/or skill use through a mood chart, diary card, or journal     Start:  12/20/22         Read Camel to take psychotropic medication as prescribed     Start:  12/20/22         Provide and outline the treatment process to Marymargaret, explaining that it will include a gradual processing of the details and feelings associated with the trauma and developing new, more appropriate coping strategies     Start:  12/20/22         Educate Devra Fontana as  to the origins of PTSD, common symptoms, and how it impacts those affected by it     Start:  12/20/22         Educate Devra Fontana on common reactions to a traumatic experience     Start:  12/20/22         Assess whether Devra Fontana experiences dissociative symptoms (e.g., flashbacks, memory loss, identity disorder), and treat or refer for treatment     Start:  12/20/22         Work with Devra Fontana to construct a list of the situations, people, & places that [place] evoke the most distressing symptoms; suggest that they keep a journal of instances of stress being triggered     Start:  12/20/22         Teach Devra Fontana coping strategies (e.g., writing down thoughts and feelings in a journal; taking deep, slow breaths; calling a support person to talk about memories) to deal with trauma memories and sudden emotional reactions without becoming emotionally      Start:  12/20/22         Perform motivational interviewing regarding physical activity     Start:  12/20/22           OP Depression     LTG: Reduce frequency, intensity, and duration of depression symptoms so that daily functioning is improved (Progressing)     Start:  12/20/22    Expected End:  09/22/23         LTG: Increase coping skills to manage depression and improve ability to perform daily activities (Progressing)     Start:  12/20/22    Expected End:  09/22/23         STG: Devra Fontana will participate in at least 80% of scheduled individual psychotherapy sessions  (Completed/Met)     Start:  12/20/22    Expected End:  09/22/23    Resolved:  07/06/23      Depression  (Completed/Met)     Start:  12/20/22    Expected End:  09/22/23    Resolved:  07/06/23   Reduce overall frequency, intensity and duration of depression so that daily functioning is not impaired per pt self report 3 out of 5 sessions documented.  Depression  (Completed/Met)     Start:  12/20/22    Expected End:  09/22/23    Resolved:  07/06/23    Recognize, accept and cope with feelings of depression per pt self report 3 out of 5 sessions.        Grief/Loss (Initial)     Start:  12/20/22    Expected End:  08/18/23      Appropriately grief loses in order to normalize mood and improve symptoms of depression per pt report 3 out 5 sessions.        Work with Devra Fontana to track symptoms, triggers, and/or skill use through a mood chart, diary card, or journal     Start:  12/20/22         Provide Devra Fontana educational information and reading material on dissociation, its causes, and symptoms     Start:  12/20/22         Therapist will educate patient on cognitive distortions and the rationale for treatment of depression     Start:  12/20/22         Therapist will review PLEASE Skills (Treat Physical Illness, Balance Eating, Avoid Mood-Altering Substances, Balance Sleep and Get Exercise) with patient     Start:  12/20/22         Perform motivational interviewing regarding physical activity     Start:  12/20/22         Depression      Start:  12/20/22      Will work with pt to learn and implement calming skills to reduce overall depression and manage depression symptoms. Some of the techniques that will be used during session will be deep breathing exercises, visual imagery, progressive muscle relaxation, postreduction relative to the benefits of self-care and other breathing exercises.         Self Esteem:     Pt stated that she wants to work on improving her self-esteem     Ability to incorporate positive changes in behavior to improve self-esteem will improve (Initial)     Start:  12/20/22    Expected End:  08/18/23            Self Esteem  (Initial)     Start:  12/20/22    Expected End:  08/18/23      Will Work with patient to decrease the frequency of negative self-descriptive statements and increase the frequency of positive self- descriptive statements using CBT/DBT/REBT techniques per patient self report 3 out  of 5 documented sessions.       Identify opportunities to increase self-esteem     Start:  12/20/22            Self- Esteem      Start:  12/20/22      Will Work with patient to decrease the frequency of negative self-descriptive statements and increase the frequency of positive self- descriptive statements using CBT/DBT/REBT techniques per patient self report 3 out of 5 documented sessions. Some of the techniques that will be used will be CBT, positive affirmations, role playing, modeling, homework and journaling.          Substance Use      Kerrilynn will abstain from drugs and alcohol per UDS while moving from the contemplation stage of change to the action stage of change and will begin to build sober supports through NA or other sober support programs.  (Initial)     Start:  03/18/24         The  therapist will assist Eldine in being able to identify and avoid triggers for using and will utilize motivational interviewing to help her move from contemplation to action.      Start:  03/18/24            Summary: Leomia presents today accompanied by her close friend Misty who she consents to joining the session. Tamelia denies any prior history of substance abuse other than tobacco use until trying cocaine  9 months ago which is now a $100 per day habit. She says that she has a younger brother with addiction issues, that her mom was an alcohol and she had a great uncle who also was a "bad alcoholic." She says that she does not want to quit but knows that she needs to. She says that using gives her relief from her depression and anxiety while she is high. She says that she would give anything to see her first husband again with the onset of her depression dating back to his death from a truck accident years ago. The therapist asks if she has attended any sort of bereavement support which she indicates that she has not.    03/18/24 1029  Alcohol / Drug Use  Pain Medications None   Prescriptions Pregabalin, Eliquis , Propanolol, and Cymbatal  Over the Counter Tylenol  p.r.n. and vitamins and supplements  History of alcohol / drug use? Yes  Longest period of sobriety (when/how long) None as she did not start using cocaine  until June of last year  Negative Consequences of Use Financial;Personal relationships (not as close with son and her sister since she told them she is using cocaine )  Substance #1  Name of Substance 1 Cocaine   1 - Age of First Use 42  1 - Amount (size/oz) $100 per day  1 - Frequency just about every day  1 - Duration 9 months  1 - Last Use / Amount last night/$100 worth  1 - Method of Aquiring elicit  1- Route of Use smoking  Substance #2  Name of Substance 2 tobacco  2 - Age of First Use 16  2 - Amount (size/oz) pack  2 - Frequency daily  2 - Duration since age 53  2 - Last Use / Amount today  2 - Method of Aquiring legal  2 - Route of Substance Use smoking  ASAM Multidimensional Assessment Summary  Dimension 1:  Description of individual's past and current experiences of substance use and withdrawal smoking $100 worth of crack daily; no risk of a life threatening withdrawal  DImension 1:  Acute Intoxication and/or Withdrawal Potential Severity Rating 1  Dimension 2:  Description of patient's biomedical conditions and  complications Afib, GERD, COPD, Arthritis, Diabetes Type 2, HTN, Incontinence, and High Cholesterol  Dimension 2:  Biomedical Conditions and Complications Severity Rating 2  Dimension 3:  Description of emotional, behavioral, or cognitive conditions and complications moderately severe depression per PHQ-9  Dimension 3:  Emotional, behavioral or cognitive (EBC) conditions and complications severity rating 3  Dimension 4:  Description of Readiness to Change criteria She is willing to enter treatment but ambivalent about doing so saying that she does not want to quit but knows that she needs to do so  Dimension 4:  Readiness to  Change Severity Rating 1  Dimension 5:  Relapse, continued use, or continued problem potential critiera description no relapse prevention skills  Dimension 5:  Relapse, continued use, or continued problem potential severity rating 3  Dimension 6:  Recovery/Iiving environment  criteria description lives alone but has a best friend, Moshe Ares, who is supportive of Cassundra's stopping and offering to drive her to and from [facility]  Dimension 6:  Recovery/living environment severity rating 0  ASAM's Severity Rating Score 9  ASAM Recommended Level of Treatment Level II Intensive Outpatient Treatment  Substance Use Disorder (SUD)  Checklist  Symptoms of Substance Use Continued use despite having a persistent/recurrent physical/psychological problem caused/exacerbated by use;Continued use despite persistent or recurrent social, interpersonal problems, caused or exacerbated by use;Presence of craving or strong urge to use;Substance(s) often taken in larger amounts or over longer times than was intended;Recurrent use that results in a failure to fulfill major role obligations (work, school, home)  Recommendations for Services/Supports/Treatments  Recommendations For Services/Supports/Treatments CD-IOP Intensive Chemical Dependency Program    Progress Towards Goals: Initial  Suicidal/Homicidal: No SI or HI  Plan: Gwendalynn meets criteria for [facility] IOP and would like to start the program on 03/22/24 as Misty cannot drive her on Friday due to having to attend a funeral. She says that she will destroy her stems this evening.   Diagnosis: Cocaine  Use Disorder, Severe; Major Depression, Recurrent, Moderate; PTSD; and Tobacco Use Disorder  Collaboration of Care: Other N/A  Patient/Guardian was advised Release of Information must be obtained prior to any record release in order to collaborate their care with an outside provider. Patient/Guardian was advised if they have not already done so to contact the registration  department to sign all necessary forms in order for us  to release information regarding their care.   Consent: Patient/Guardian gives verbal consent for treatment and assignment of benefits for services provided during this visit. Patient/Guardian expressed understanding and agreed to proceed.   Melynda Stagger, MA, LCSW, [facility], LCAS 03/18/2024

## 2024-03-22 ENCOUNTER — Other Ambulatory Visit (HOSPITAL_COMMUNITY): Payer: Self-pay

## 2024-03-22 ENCOUNTER — Ambulatory Visit (HOSPITAL_COMMUNITY)

## 2024-03-22 ENCOUNTER — Other Ambulatory Visit: Payer: Self-pay | Admitting: Cardiovascular Disease

## 2024-03-22 ENCOUNTER — Telehealth (HOSPITAL_COMMUNITY): Payer: Self-pay

## 2024-03-22 ENCOUNTER — Telehealth (HOSPITAL_COMMUNITY): Payer: Self-pay | Admitting: Licensed Clinical Social Worker

## 2024-03-22 ENCOUNTER — Telehealth: Payer: Self-pay | Admitting: Cardiovascular Disease

## 2024-03-22 ENCOUNTER — Encounter: Payer: Self-pay | Admitting: Internal Medicine

## 2024-03-22 ENCOUNTER — Telehealth: Payer: Self-pay

## 2024-03-22 DIAGNOSIS — I480 Paroxysmal atrial fibrillation: Secondary | ICD-10-CM

## 2024-03-22 NOTE — Telephone Encounter (Signed)
 Per test claim pharmacy filled a 30 day supply on 03/10/24. Spoke to pharmacy and patient picked up for a $0 copay. Patient should not be due for Eliquis  at this time. Plan will pay again on or after 04/03/24.

## 2024-03-22 NOTE — Telephone Encounter (Signed)
 Prescription refill request for Eliquis  received. Indication: PAF Last office visit: 01/06/24  Dayton Evener NP Scr:0.84 on 05/14/23  Epic Age: 68 Weight: 85.5kg  Based on above findings Eliquis  5mg  twice daily is the appropriate dose.  Refill approved.

## 2024-03-22 NOTE — Telephone Encounter (Signed)
 Pt c/o medication issue:  1. Name of Medication:   apixaban  (ELIQUIS ) 5 MG TABS tablet   2. How are you currently taking this medication (dosage and times per day)?     3. Are you having a reaction (difficulty breathing--STAT)?   4. What is your medication issue?   Caller Athena Bland) stated patient cannot afford this medication and is distressed and wants to stop taking this medication.  Caller wants a call back to discuss if patient can change to clopidogrel  or alternate medication.

## 2024-03-22 NOTE — Telephone Encounter (Signed)
 Kathryn Friedman calls and leaves a message saying she left a message for [employer], but was unsure on which number she left the message. She says she has "real bad diarrhea" and "absolutely wants to be in the program". This therapist calls her back and reaches VM.  Therapist leaves a HIPAA compliant message.  Earnie Gola, MS, LFMT, LCAS

## 2024-03-22 NOTE — Telephone Encounter (Signed)
 Concerns expressed to clinic about Eliquis . Per test claim pharmacy filled a 30 day supply on 03/10/24. Spoke to pharmacy and patient picked up for a $0 copay. Patient should not be due for Eliquis  at this time. Plan will pay again on or after 04/03/24.

## 2024-03-22 NOTE — Telephone Encounter (Signed)
 The therapist attempts to reach [place] after she no shows for her initial [facility] IOP leaving her a Architect.  Melynda Stagger, MA, LCSW, [facility], LCAS 03/22/2024

## 2024-03-24 ENCOUNTER — Ambulatory Visit
Admission: RE | Admit: 2024-03-24 | Discharge: 2024-03-24 | Disposition: A | Discharge status: Home / Self Care | Source: Ambulatory Visit | Attending: Student | Admitting: Student

## 2024-03-24 ENCOUNTER — Ambulatory Visit (HOSPITAL_COMMUNITY)

## 2024-03-24 DIAGNOSIS — Z96642 Presence of left artificial hip joint: Secondary | ICD-10-CM | POA: Diagnosis not present

## 2024-03-24 DIAGNOSIS — M4807 Spinal stenosis, lumbosacral region: Secondary | ICD-10-CM | POA: Insufficient documentation

## 2024-03-24 DIAGNOSIS — M7062 Trochanteric bursitis, left hip: Secondary | ICD-10-CM

## 2024-03-24 DIAGNOSIS — M5416 Radiculopathy, lumbar region: Secondary | ICD-10-CM | POA: Insufficient documentation

## 2024-03-24 DIAGNOSIS — M47816 Spondylosis without myelopathy or radiculopathy, lumbar region: Secondary | ICD-10-CM | POA: Diagnosis not present

## 2024-03-24 NOTE — Progress Notes (Deleted)
 Cardiology Clinic Note   Date: 03/24/2024 ID: Lacy, Hulsebus January 14, 1956, MRN [id number]  Primary Cardiologist:  None  Chief Complaint   Kathryn Friedman is a 68 y.o. female who presents to the clinic today for ***  Patient Profile   Kathryn Friedman is followed by Dr. Gollan for the history outlined below.      Past medical history significant for: CAD. Stents x 2 2007. LHC 05/14/2018: Distal RCA 100% with left-to-right collaterals.  Post atrio 100%.  Ostial RPDA to PDA 70%.  Mid RCA #1 70%, #2 85%.  Proximal to mid RCA 40%.  Proximal RCA 55%.  Proximal to mid LCx 30%.  Mid to distal LCx 60%.  Ostial proximal LAD 30%.  Medical management was recommended. PAF. 14-day ZIO 03/06/2022: HR 60 to 179 bpm, average 80 bpm.  151 runs of SVT fastest 5 beats max rate 179 bpm, longest 21.4 seconds average rate 101 bpm.  A-fib occurred 3% with HR 65 to 169 bpm, average 25 bpm, longest run lasting 4 hours 4 minutes with average rate 98 bpm not triggered by patient. Echo 03/25/2023: EF 60 to 65%.  No RWMA.  Normal diastolic parameters.  Normal RV size/function.  Normal PA pressure, RVSP 35.7 mmHg.  Mild LAE.  Mild MR. PSVT. Carotid artery disease. Carotid ultrasound 08/03/2020: Minimal atherosclerotic plaque bilaterally resulting in <50% stenosis bilateral ICAs. Hypertension. Hyperlipidemia. Lipid panel 05/14/2023: LDL 44, HDL 38, TG 83, total 99. COPD. Interstitial lung disease. OSA. T2DM. GERD. DVT. Tobacco abuse.  In summary, patient has a history of CAD with stents x 2 in 2007.  She underwent repeat LHC in June 2019 for chest pain showing occluded distal RCA with left-to-right collaterals and nonobstructive disease in LCx and LAD as detailed above.  She had a carotid ultrasound in September 2021 showed minimal plaque as detailed above.  She has a history of DVT following hip replacement surgery in July 2022 and was placed on Eliquis .  She was evaluated in March 2023 for an episode of near  syncope in the shower.  She wore a 14-day ZIO in April which demonstrated 3% burden of A-fib and multiple runs of SVT as detailed above.  Echo in April 2024 demonstrated normal LV/RV function as detailed above.  Patient was last seen in the office by Dr. Gollan on 12/24/2022 for routine follow-up.  She denied angina at that time.  She reported difficulty affording Eliquis  and other options were discussed at that time.     History of Present Illness    Today, patient ***  CAD Stents x 2 2007.  LHC June 2019 showed occluded distal RCA with left-to-right collaterals, post atrio 100%, ostial RPDA to PDA 70%, mid RCA #1 70%, #2 85%, proximal to mid RCA 40%, proximal RCA 55%, proximal to mid LCx 30%, mid to distal LCx 60%, ostial to proximal LAD 30% recommend medical management.  Patient*** - Continue amlodipine , isosorbide , propranolol , rosuvastatin , as needed SL NTG.  Not on aspirin  secondary to Eliquis .  PAF/PSVT 14-day ZIO April 2023 showed 3% burden of A-fib and 151 runs of SVT.  Denies spontaneous bleeding concerns.  Patient***EKG*** - Continue propranolol , Eliquis .  Hypertension BP today*** - Continue amlodipine , isosorbide , propranolol , telmisartan .  Hyperlipidemia LDL 40 29 April 2023, at goal. - Continue rosuvastatin .  Tobacco abuse Patient***  ROS: All other systems reviewed and are otherwise negative except as noted in History of Present Illness.  EKGs/Labs Reviewed        05/14/2023: ALT  11; AST 15; BUN 12; Creatinine, Ser 0.84; Potassium 4.4; Sodium 136   01/05/2024: Hemoglobin 14.9; WBC 6.6   No results found for requested labs within last 365 days.   No results found for requested labs within last 365 days.  ***  Risk Assessment/Calculations    {Does this patient have ATRIAL FIBRILLATION?:[phone]} No BP recorded.  {Refresh Note OR Click here to enter BP  :1}***        Physical Exam    VS:  There were no vitals taken for this visit. , BMI There is no height  or weight on file to calculate BMI.  GEN: Well nourished, well developed, in no acute distress. Neck: No JVD or carotid bruits. Cardiac: *** RRR. No murmurs. No rubs or gallops.   Respiratory:  Respirations regular and unlabored. Clear to auscultation without rales, wheezing or rhonchi. GI: Soft, nontender, nondistended. Extremities: Radials/DP/PT 2+ and equal bilaterally. No clubbing or cyanosis. No edema ***  Skin: Warm and dry, no rash. Neuro: Strength intact.  Assessment & Plan   ***  Disposition: ***     {Are you ordering a CV Procedure (e.g. stress test, cath, DCCV, TEE, etc)?   Press F2        :[id number]}   Signed, Lonell Rives. Shakita Keir, DNP, NP-C

## 2024-03-25 ENCOUNTER — Ambulatory Visit: Admitting: Student

## 2024-03-25 ENCOUNTER — Telehealth (HOSPITAL_COMMUNITY): Payer: Self-pay

## 2024-03-25 ENCOUNTER — Other Ambulatory Visit: Payer: Self-pay | Admitting: Internal Medicine

## 2024-03-25 DIAGNOSIS — J449 Chronic obstructive pulmonary disease, unspecified: Secondary | ICD-10-CM

## 2024-03-25 NOTE — Telephone Encounter (Signed)
 Ameerah calls and this therapist answers.  When she hears this therapist's name. Shakema says she has the wrong number.  Therapist tries to return the call but VM answered. Therapist leaves a HIPAA complaint message.  Earnie Gola, MS, LMFT, LCAS 03-25-24

## 2024-03-26 ENCOUNTER — Ambulatory Visit (HOSPITAL_COMMUNITY)

## 2024-03-28 ENCOUNTER — Other Ambulatory Visit: Payer: Self-pay | Admitting: Internal Medicine

## 2024-03-29 ENCOUNTER — Ambulatory Visit (HOSPITAL_COMMUNITY)

## 2024-03-29 ENCOUNTER — Other Ambulatory Visit: Payer: Self-pay

## 2024-03-29 ENCOUNTER — Telehealth (HOSPITAL_COMMUNITY): Payer: Self-pay | Admitting: Licensed Clinical Social Worker

## 2024-03-29 NOTE — Telephone Encounter (Signed)
 The therapist attempts to reach [place] leaving a [employer].  Melynda Stagger, MA, LCSW, [facility], LCAS 03/26/2024

## 2024-03-30 ENCOUNTER — Telehealth (HOSPITAL_COMMUNITY): Payer: Self-pay | Admitting: Licensed Clinical Social Worker

## 2024-03-30 ENCOUNTER — Encounter (HOSPITAL_COMMUNITY): Payer: Self-pay | Admitting: Licensed Clinical Social Worker

## 2024-03-30 MED ORDER — NYSTATIN 100000 UNIT/GM EX POWD
Freq: Two times a day (BID) | CUTANEOUS | 0 refills | Status: AC
Start: 2024-03-30 — End: ?

## 2024-03-30 NOTE — Telephone Encounter (Signed)
 The therapist receives the following email from [place]:  "Maya Sparrow,  I got your message today but sorry I was in the bed. I am going to drop out for now. I can't afford gas to and from [place] 3 times a week at this point. My checking account is currently over drawn because of a company that scammed me. I don't have any bread in the house and afraid I will run out of food for my pets. I just can't afford it right now. I don't know if you will let me come back later on or not but if not that's ok, I will like your help but right now I just can't do it.  Sincerely,  [employer]"  The therapist sends Yanelie correspondence via MyChart informing her that she would be eligible for admission to [facility] in the future and that this therapist could also see her for individual sessions via tele-health.   Melynda Stagger, MA, LCSW, [facility], LCAS 03/30/2024

## 2024-03-31 ENCOUNTER — Ambulatory Visit (HOSPITAL_COMMUNITY)

## 2024-04-01 ENCOUNTER — Ambulatory Visit (INDEPENDENT_AMBULATORY_CARE_PROVIDER_SITE_OTHER): Admitting: Licensed Clinical Social Worker

## 2024-04-01 ENCOUNTER — Other Ambulatory Visit: Payer: Self-pay | Admitting: Cardiovascular Disease

## 2024-04-01 ENCOUNTER — Other Ambulatory Visit: Payer: Self-pay | Admitting: Student

## 2024-04-01 DIAGNOSIS — F331 Major depressive disorder, recurrent, moderate: Secondary | ICD-10-CM

## 2024-04-01 DIAGNOSIS — M4807 Spinal stenosis, lumbosacral region: Secondary | ICD-10-CM

## 2024-04-01 DIAGNOSIS — F4310 Post-traumatic stress disorder, unspecified: Secondary | ICD-10-CM

## 2024-04-01 DIAGNOSIS — F1420 Cocaine dependence, uncomplicated: Secondary | ICD-10-CM | POA: Diagnosis not present

## 2024-04-01 DIAGNOSIS — F17200 Nicotine dependence, unspecified, uncomplicated: Secondary | ICD-10-CM

## 2024-04-01 DIAGNOSIS — M5416 Radiculopathy, lumbar region: Secondary | ICD-10-CM

## 2024-04-01 DIAGNOSIS — M47816 Spondylosis without myelopathy or radiculopathy, lumbar region: Secondary | ICD-10-CM

## 2024-04-01 NOTE — Progress Notes (Signed)
 THERAPIST PROGRESS NOTE  Session Time: 2 p.m. to 3 p.m.   Virtual Visit via Video Note   I connected with Kathryn Friedman at 2 p.m. EST by a video enabled telemedicine application and verified that I am speaking with the correct person using two identifiers.   Location: Patient: home Provider: [facility] office   I discussed the limitations of evaluation and management by telemedicine and the availability of in person appointments. The patient expressed understanding and agreed to proceed.  Type of Therapy: Individual   Therapist Response/Interventions: Solution-Focused/The therapist encourages Kathryn Friedman to start attending at least one virtual NA meeting per day and to just listen to what is being discussed without having to share.  The therapist talks to Kathryn Friedman about her friendship with Moshe Ares being a barrier to recovery as recovery involves avoiding people, places, and things associated with using.   Treatment Goals addressed:  Active     Anxiety     STG: Kathryn Friedman will participate in at least 80% of scheduled individual psychotherapy sessions  (Completed/Met)     Start:  12/20/22    Expected End:  09/22/23    Resolved:  07/06/23      Anxiety  (Progressing)     Start:  12/20/22    Expected End:  09/22/23      Reduce overall frequency, intensity and duration of anxiety so that daily functioning is not impaired per pt self report 3 out of 5 sessions.        Work with Kathryn Friedman to track Friedman, triggers, and/or skill use through a mood chart, diary card, or journal     Start:  12/20/22         Perform motivational interviewing regarding physical activity     Start:  12/20/22         Encourage Kathryn Friedman to take psychotropic medication(s) as prescribed     Start:  12/20/22         Perform psychoeducation regarding anxiety disorders     Start:  12/20/22         Provide Kathryn Friedman with educational information and reading material on anxiety, its causes, and Friedman.       Start:  12/20/22         Create a weekly activity schedule     Start:  12/20/22         Perform motivational interviewing regarding use of tools     Start:  12/20/22         Anxiety      Start:  12/20/22      Will work with the pt using CBT/DBT/REBT techniques to help the pt verbalize an understanding of the cognitive, physiological, and behavioral components of anxiety and its treatment. This will be done by using worksheets, interactive activities, CBT/ABC thought logs, modeling, homework, role playing and journaling. Will work with pt to  learn and implement coping skills that result in a reduction of anxiety and improve daily functioning per pt self report 3 out of 5 documented sessions.           BH CCP Acute or Chronic Trauma Reaction     LTG: Recall traumatic events without becoming overwhelmed with negative emotions (Progressing)     Start:  12/20/22    Expected End:  09/22/23         Trauma  (Progressing)     Start:  12/20/22    Expected End:  09/22/23      Pt will explore coping skills and  learn coping and grounding skills to reduce Friedman of PTSD and prepare to handle future stressful situations as evidenced by implementing coping skills per pt self-report 3 out of 5 documented sessions.        STG: Kathryn Friedman will describe the signs and Friedman of PTSD that are experienced and how they interfere with daily living (Progressing)     Start:  12/20/22    Expected End:  09/22/23         STG: Kathryn Friedman will identify internal and external stimuli that trigger PTSD Friedman (Completed/Met)     Start:  12/20/22    Expected End:  09/22/23    Resolved:  07/06/23      STG: Kathryn Friedman will acknowledge that healing from PTSD is a gradual process (Completed/Met)     Start:  12/20/22    Expected End:  09/22/23    Resolved:  05/28/23      STG: Kathryn Friedman will identify coping strategies to deal with trauma memories and the associated emotional reaction (Progressing)      Start:  12/20/22    Expected End:  09/22/23         Work with Kathryn Friedman to track Friedman, triggers, and/or skill use through a mood chart, diary card, or journal     Start:  12/20/22         Read Camel to take psychotropic medication as prescribed     Start:  12/20/22         Provide and outline the treatment process to Kathryn Friedman, explaining that it will include a gradual processing of the details and feelings associated with the trauma and developing new, more appropriate coping strategies     Start:  12/20/22         Educate Kathryn Friedman as to the origins of PTSD, common Friedman, and how it impacts those affected by it     Start:  12/20/22         Educate Kathryn Friedman on common reactions to a traumatic experience     Start:  12/20/22         Assess whether Kathryn Friedman experiences dissociative Friedman (e.g., flashbacks, memory loss, identity disorder), and treat or refer for treatment     Start:  12/20/22         Work with Kathryn Friedman to construct a list of the situations, people, & places that Kathryn Friedman; suggest that they keep a journal of instances of stress being triggered     Start:  12/20/22         Teach Kathryn Friedman coping strategies (e.g., writing down thoughts and feelings in a journal; taking deep, slow breaths; calling a support person to talk about memories) to deal with trauma memories and sudden emotional reactions without becoming emotionally      Start:  12/20/22         Perform motivational interviewing regarding physical activity     Start:  12/20/22           OP Depression     LTG: Reduce frequency, intensity, and duration of depression Friedman so that daily functioning is improved (Progressing)     Start:  12/20/22    Expected End:  09/22/23         LTG: Increase coping skills to manage depression and improve ability to perform daily activities (Progressing)     Start:  12/20/22    Expected End:  09/22/23          STG: Kathryn Friedman will participate  in at least 80% of scheduled individual psychotherapy sessions  (Completed/Met)     Start:  12/20/22    Expected End:  09/22/23    Resolved:  07/06/23      Depression  (Completed/Met)     Start:  12/20/22    Expected End:  09/22/23    Resolved:  07/06/23   Reduce overall frequency, intensity and duration of depression so that daily functioning is not impaired per pt self report 3 out of 5 sessions documented.        Depression  (Completed/Met)     Start:  12/20/22    Expected End:  09/22/23    Resolved:  07/06/23   Recognize, accept and cope with feelings of depression per pt self report 3 out of 5 sessions.        Grief/Loss (Initial)     Start:  12/20/22    Expected End:  08/18/23      Appropriately grief loses in order to normalize mood and improve Friedman of depression per pt report 3 out 5 sessions.        Work with Kathryn Friedman to track Friedman, triggers, and/or skill use through a mood chart, diary card, or journal     Start:  12/20/22         Provide Kathryn Friedman educational information and reading material on dissociation, its causes, and Friedman     Start:  12/20/22         Therapist will educate patient on cognitive distortions and the rationale for treatment of depression     Start:  12/20/22         Therapist will review PLEASE Skills (Treat Physical Illness, Balance Eating, Avoid Mood-Altering Substances, Balance Sleep and Get Exercise) with patient     Start:  12/20/22         Perform motivational interviewing regarding physical activity     Start:  12/20/22         Depression      Start:  12/20/22      Will work with pt to learn and implement calming skills to reduce overall depression and manage depression Friedman. Some of the techniques that will be used during session will be deep breathing exercises, visual imagery, progressive muscle relaxation, postreduction relative to the benefits of self-care and other  breathing exercises.         Self Esteem:     Pt stated that she wants to work on improving her self-esteem     Ability to incorporate positive changes in behavior to improve self-esteem will improve (Initial)     Start:  12/20/22    Expected End:  08/18/23            Self Esteem  (Initial)     Start:  12/20/22    Expected End:  08/18/23      Will Work with patient to decrease the frequency of negative self-descriptive statements and increase the frequency of positive self- descriptive statements using CBT/DBT/REBT techniques per patient self report 3 out of 5 documented sessions.       Identify opportunities to increase self-esteem     Start:  12/20/22            Self- Esteem      Start:  12/20/22      Will Work with patient to decrease the frequency of negative self-descriptive statements and increase the frequency of positive self- descriptive statements using CBT/DBT/REBT techniques per patient self report 3 out of 5  documented sessions. Some of the techniques that will be used will be CBT, positive affirmations, role playing, modeling, homework and journaling.          Substance Use      Kathryn Friedman will abstain from drugs and alcohol per UDS while moving from the contemplation stage of change to the action stage of change and will begin to build sober supports through NA or other sober support programs.  (Not Progressing)     Start:  03/18/24    Expected End:  10/02/24         The therapist will assist Kathryn Friedman in being able to identify and avoid triggers for using and will utilize motivational interviewing to help her move from contemplation to action.      Start:  03/18/24               Summary: Kathryn Friedman presents saying that "things have gotten a whole lot worse" since she last met with this therapist 2 weeks ago. She got charged $89 she did not authorize from a "Kathryn Friedman" site. She filed a dispute with her bank and got a provisional credit. Kathryn Friedman  contacted her and gave her the money back. Friedman forgot about the "whole thing" and ended up overdrawn with bills coming in. She contacted her son to borrow. $55 and he called her a liar thinking she wanted it to buy crack. She could not believe her son talked to her that way. She says that at this point that she was ready to die so threw her medication away and just stayed in the bed. She begged God to take her saying that she just gave her son 25K to furnish her house.   Her friend came over finding the medication in the trash can taking it with her and bought Kathryn Friedman groceries when no one else would help. Kathryn Friedman says that [place] smokes crack but only when she comes to see Kathryn Friedman every two or three weeks or so. She says that Kathryn Friedman "drinks real bad." She says that she has had to call the Law on [place] on three different occasions as she gets "blind drunk."   Macaylah says that in talking with her sister that she found out that her mother was addicted to pain medication. Henreitta says that she last used crack two days. Prior to two days ago, she used on Saturday.   Emalia has resumed taking her medications saying that she felt so bad that she had to go back on it. She has also turned her diabetes sensor back on.    Progress Towards Goals: Progressing  Suicidal/Homicidal: No SI or HI  Plan: Nissa will return in 11 days.   Diagnosis: Cocaine  Use Disorder, Severe; Major Depression, Recurrent, Moderate; PTSD; and Tobacco Use Disorder  Collaboration of Care: Other N/A  Patient/Guardian was advised Release of Information must be obtained prior to any record release in order to collaborate their care with an outside provider. Patient/Guardian was advised if they have not already done so to contact the registration department to sign all necessary forms in order for us  to release information regarding their care.   Consent: Patient/Guardian gives verbal consent for treatment and assignment  of benefits for services provided during this visit. Patient/Guardian expressed understanding and agreed to proceed.   Melynda Stagger, MA, LCSW, [facility], LCAS 04/01/2024

## 2024-04-02 ENCOUNTER — Ambulatory Visit (HOSPITAL_COMMUNITY)

## 2024-04-05 ENCOUNTER — Other Ambulatory Visit

## 2024-04-05 ENCOUNTER — Ambulatory Visit (HOSPITAL_COMMUNITY)

## 2024-04-06 ENCOUNTER — Telehealth: Payer: Self-pay | Admitting: Cardiovascular Disease

## 2024-04-06 NOTE — Telephone Encounter (Signed)
 Left voicemail, pt needs appt with Cadence Gennaro Khat- PA on 5/15 rescheduled due to provider being out of office.

## 2024-04-07 ENCOUNTER — Ambulatory Visit (HOSPITAL_COMMUNITY)

## 2024-04-07 NOTE — Telephone Encounter (Signed)
Pt scheduled on 6/6

## 2024-04-08 ENCOUNTER — Ambulatory Visit: Admitting: Medical

## 2024-04-09 ENCOUNTER — Ambulatory Visit (HOSPITAL_COMMUNITY)

## 2024-04-10 ENCOUNTER — Ambulatory Visit: Admission: RE | Admit: 2024-04-10 | Source: Ambulatory Visit

## 2024-04-12 ENCOUNTER — Ambulatory Visit: Payer: Self-pay | Admitting: Urology

## 2024-04-12 ENCOUNTER — Ambulatory Visit (HOSPITAL_COMMUNITY)

## 2024-04-13 ENCOUNTER — Encounter (INDEPENDENT_AMBULATORY_CARE_PROVIDER_SITE_OTHER): Payer: Self-pay

## 2024-04-13 ENCOUNTER — Telehealth (HOSPITAL_COMMUNITY): Payer: Self-pay | Admitting: Licensed Clinical Social Worker

## 2024-04-13 ENCOUNTER — Ambulatory Visit (HOSPITAL_COMMUNITY): Admitting: Licensed Clinical Social Worker

## 2024-04-13 ENCOUNTER — Encounter (HOSPITAL_COMMUNITY): Payer: Self-pay

## 2024-04-13 NOTE — Telephone Encounter (Signed)
 Kathryn Friedman leaves a voicemail two minutes before her appointment saying that she will not make her virtual appointment today as she is stuck in the bathroom with diarrhea.  Melynda Stagger, MA, LCSW, [facility], LCAS 04/13/2024

## 2024-04-14 ENCOUNTER — Ambulatory Visit (HOSPITAL_COMMUNITY)

## 2024-04-14 ENCOUNTER — Ambulatory Visit: Admitting: Psychiatry

## 2024-04-15 ENCOUNTER — Ambulatory Visit
Admission: RE | Admit: 2024-04-15 | Discharge: 2024-04-15 | Disposition: A | Discharge status: Home / Self Care | Source: Ambulatory Visit | Attending: Student | Admitting: Student

## 2024-04-15 DIAGNOSIS — M47816 Spondylosis without myelopathy or radiculopathy, lumbar region: Secondary | ICD-10-CM | POA: Insufficient documentation

## 2024-04-15 DIAGNOSIS — M5416 Radiculopathy, lumbar region: Secondary | ICD-10-CM | POA: Diagnosis not present

## 2024-04-15 DIAGNOSIS — M47815 Spondylosis without myelopathy or radiculopathy, thoracolumbar region: Secondary | ICD-10-CM | POA: Diagnosis not present

## 2024-04-15 DIAGNOSIS — M48061 Spinal stenosis, lumbar region without neurogenic claudication: Secondary | ICD-10-CM | POA: Diagnosis not present

## 2024-04-15 DIAGNOSIS — M47817 Spondylosis without myelopathy or radiculopathy, lumbosacral region: Secondary | ICD-10-CM | POA: Diagnosis not present

## 2024-04-15 DIAGNOSIS — M4807 Spinal stenosis, lumbosacral region: Secondary | ICD-10-CM | POA: Diagnosis not present

## 2024-04-16 ENCOUNTER — Ambulatory Visit (HOSPITAL_COMMUNITY)

## 2024-04-21 ENCOUNTER — Ambulatory Visit (HOSPITAL_COMMUNITY)

## 2024-04-22 DIAGNOSIS — N3281 Overactive bladder: Secondary | ICD-10-CM

## 2024-04-23 ENCOUNTER — Ambulatory Visit (HOSPITAL_COMMUNITY)

## 2024-04-26 ENCOUNTER — Other Ambulatory Visit: Payer: Self-pay | Admitting: Medical Genetics

## 2024-04-29 ENCOUNTER — Ambulatory Visit: Admitting: Internal Medicine

## 2024-04-30 ENCOUNTER — Ambulatory Visit: Admitting: Medical

## 2024-05-04 NOTE — Progress Notes (Signed)
 This encounter was created in error - please disregard.

## 2024-05-05 ENCOUNTER — Ambulatory Visit: Admitting: Internal Medicine

## 2024-05-13 ENCOUNTER — Other Ambulatory Visit (HOSPITAL_COMMUNITY): Payer: Self-pay

## 2024-05-13 ENCOUNTER — Telehealth: Payer: Self-pay | Admitting: *Deleted

## 2024-05-13 ENCOUNTER — Telehealth: Payer: Self-pay | Admitting: Pharmacy Technician

## 2024-05-13 NOTE — Telephone Encounter (Signed)
 Received a fax from [facility].  Pt cannot afford her Copay for Eliquis  and states pt didn't qualify for assistance.  The cheaper alternative for that is Dabigatran. Pt cannot afford her copay for Propranolol  and an cheaper alternative is Metoprolol ER.    Please advise.  Phone# [phone] Fax# [phone]

## 2024-05-13 NOTE — Telephone Encounter (Signed)
 Test claims: Eliquis  5MG - $45.00 #60 tabs- one month    Dabigatran 150 MG- $69.44- one month   Inderal  80MG (her RX) was too soon so I tried a different strength just to try a claim and 60MG  was $6.00   Metoprolol ER-  50MG  and 100MG - $0.00 no copay

## 2024-05-14 MED ORDER — TROSPIUM CHLORIDE 20 MG PO TABS: 20.0000 mg | ORAL_TABLET | Freq: Two times a day (BID) | ORAL | 11 refills | Status: DC

## 2024-05-14 MED ORDER — TROSPIUM CHLORIDE 20 MG PO TABS: 20.0000 mg | ORAL_TABLET | Freq: Two times a day (BID) | ORAL | 11 refills | Status: AC

## 2024-05-14 NOTE — Addendum Note (Signed)
 Addended byKatina Parlor on: 05/14/2024 03:09 PM   Modules accepted: Orders

## 2024-05-14 NOTE — Addendum Note (Signed)
 Addended byKatina Parlor on: 05/14/2024 03:38 PM   Modules accepted: Orders

## 2024-05-14 NOTE — Telephone Encounter (Signed)
 Called patient and notified her of the following from Dr. Gollan.   Appears I was last one to see her in clinic January 2024.  It is going to take some time to sort out her medications Would recommend a clinic visit for further discussion Myself or APP Thx TGollan  Patient verbalizes understanding. Patient scheduled to be seen in clinic on 05/18/24.

## 2024-05-18 ENCOUNTER — Telehealth: Payer: Self-pay | Admitting: Medical

## 2024-05-18 ENCOUNTER — Ambulatory Visit: Admitting: Medical

## 2024-05-18 NOTE — Telephone Encounter (Signed)
 Pt would like to know if her upcoming appt on 6/27 could be made a virtual to due the heat and pt has copd. Requesting cb

## 2024-05-18 NOTE — Telephone Encounter (Signed)
  Patient Consent for Virtual Visit         Kathryn Friedman has provided verbal consent on 05/18/2024 for a virtual visit (video or telephone).   CONSENT FOR VIRTUAL VISIT FOR:  Kathryn Friedman  By participating in this virtual visit I agree to the following:  I hereby voluntarily request, consent and authorize [facility] and its employed or contracted physicians, physician assistants, nurse practitioners or other licensed health care professionals (the Practitioner), to provide me with telemedicine health care services (the "Services) as deemed necessary by the treating Practitioner. I acknowledge and consent to receive the Services by the Practitioner via telemedicine. I understand that the telemedicine visit will involve communicating with the Practitioner through live audiovisual communication technology and the disclosure of certain medical information by electronic transmission. I acknowledge that I have been given the opportunity to request an in-person assessment or other available alternative prior to the telemedicine visit and am voluntarily participating in the telemedicine visit.  I understand that I have the right to withhold or withdraw my consent to the use of telemedicine in the course of my care at any time, without affecting my right to future care or treatment, and that the Practitioner or I may terminate the telemedicine visit at any time. I understand that I have the right to inspect all information obtained and/or recorded in the course of the telemedicine visit and may receive copies of available information for a reasonable fee.  I understand that some of the potential risks of receiving the Services via telemedicine include:  Delay or interruption in medical evaluation due to technological equipment failure or disruption; Information transmitted may not be sufficient (e.g. poor resolution of images) to allow for appropriate medical decision making by the  Practitioner; and/or  In rare instances, security protocols could fail, causing a breach of personal health information.  Furthermore, I acknowledge that it is my responsibility to provide information about my medical history, conditions and care that is complete and accurate to the best of my ability. I acknowledge that Practitioner's advice, recommendations, and/or decision may be based on factors not within their control, such as incomplete or inaccurate data provided by me or distortions of diagnostic images or specimens that may result from electronic transmissions. I understand that the practice of medicine is not an exact science and that Practitioner makes no warranties or guarantees regarding treatment outcomes. I acknowledge that a copy of this consent can be made available to me via my patient portal [facility] MyChart), or I can request a printed copy by calling the office of [facility].    I understand that my insurance will be billed for this visit.   I have read or had this consent read to me. I understand the contents of this consent, which adequately explains the benefits and risks of the Services being provided via telemedicine.  I have been provided ample opportunity to ask questions regarding this consent and the Services and have had my questions answered to my satisfaction. I give my informed consent for the services to be provided through the use of telemedicine in my medical care

## 2024-05-18 NOTE — Progress Notes (Deleted)
  Cardiology Office Note   Date:  05/18/2024  ID:  Kathryn Friedman, Kathryn Friedman [birth year], MRN [id number] PCP: Glendia Shad, MD  [facility] Providers Cardiologist:  Evalene Lunger, MD { Click to update primary MD,subspecialty MD or APP then REFRESH:1}    History of Present Illness Kathryn Friedman is a 68 y.o. female with a history of anxiety, CAD with remote stenting, COPD, depression, GERD, hypertension, hyperlipidemia, OSA on CPAP, PSVT, diabetes type 2, tobacco abuse, venous insufficiency, and lymphedema who presents for follow-up.  Patient has a history of CAD with stenting x 2 in 2007.  Heart cath June 2019 showed moderate to severe proximal RCA disease, occluded distal RCA with collaterals left-to-right, mild LAD and circumflex disease.  Patient has a history of DVT in 2022 after hip replacement.  She has unclear history of paroxysmal A-fib on Eliquis .  Her heart monitor at [facility] showed 12% A-fib burden.  Echo 03/29/2023 showed EF 60 to 65%, no wall motion abnormality, normal RV SF, mild MR.  ROS: ***  Studies Reviewed      *** Risk Assessment/Calculations {Does this patient have ATRIAL FIBRILLATION?:[phone]} No BP recorded.  {Refresh Note OR Click here to enter BP  :1}***       Physical Exam VS:  There were no vitals taken for this visit.       Wt Readings from Last 3 Encounters:  04/15/24 184 lb (83.5 kg)  03/17/24 184 lb (83.5 kg)  03/04/24 188 lb 8 oz (85.5 kg)    GEN: Well nourished, well developed in no acute distress NECK: No JVD; No carotid bruits CARDIAC: ***RRR, no murmurs, rubs, gallops RESPIRATORY:  Clear to auscultation without rales, wheezing or rhonchi  ABDOMEN: Soft, non-tender, non-distended EXTREMITIES:  No edema; No deformity   ASSESSMENT AND PLAN ***    {Are you ordering a CV Procedure (e.g. stress test, cath, DCCV, TEE, etc)?   Press F2        :[id number]}  Dispo: ***  Signed, Averil Digman VEAR Fishman, PA-C

## 2024-05-20 NOTE — Progress Notes (Unsigned)
 Virtual Visit via Telephone Note   Because of MARSA MATTEO co-morbid illnesses, she is at least at moderate risk for complications without adequate follow up.  This format is felt to be most appropriate for this patient at this time.  The patient did not have access to video technology/had technical difficulties with video requiring transitioning to audio format only (telephone).  All issues noted in this document were discussed and addressed.  No physical exam could be performed with this format.  Please refer to the patient's chart for her consent to telehealth for [facility].   Date:  05/24/2024   ID:  Kathryn Friedman, DOB Nov 21, 1956, MRN [id number] The patient was identified using 2 identifiers.  Patient Location: Home Provider Location: Home Office   PCP:  Glendia Shad, MD   [facility] Providers Cardiologist:  Evalene Lunger, MD     Evaluation Performed:  Follow-Up Visit  Chief Complaint: Overdue follow-up   History of Present Illness:    Kathryn Friedman is a 68 y.o. female with with a history of anxiety, CAD with remote stenting, COPD, depression, GERD, hypertension, hyperlipidemia, OSA on CPAP, PSVT, diabetes type 2, tobacco abuse, venous insufficiency, and lymphedema who presents for overdue follow-up and pre-op evaluation.   Patient has a history of CAD with stenting x 2 in 2007.  Heart cath June 2019 showed moderate to severe proximal RCA disease, occluded distal RCA with collaterals left-to-right, mild LAD and circumflex disease.   Patient has a history of DVT in 2022 after hip replacement.  She has unclear history of paroxysmal A-fib on Eliquis .  Her heart monitor at [facility] clinic showed 12% A-fib burden.   Echo 03/29/2023 showed EF 60 to 65%, no wall motion abnormality, normal RV SF, mild MR.  The patient was last seen 11/2022 reporting chronic and stable SOB. She was in NSR.  Today, the patient reports she has not had Eliquis  in 6  weeks due to not affording it. She denies any afib episodes. She was unable to come into the office due to COPD and trouble breathing.She denies chest pain, lower leg edema, lightheadedness or dizziness. She is not wanting to revisit blood thinners for Afib. Patient is still smoking 1ppd. She says she is needing carpal tunnel surgery.   Past Medical History:  Diagnosis Date   Anemia    Anginal pain (HCC)    Anxiety    Arthritis    back and knees   Asthma    Bilateral carotid artery stenosis    Blood in stool    Carpal tunnel syndrome, right    Cervical radiculopathy    Chronic diarrhea    COPD (chronic obstructive pulmonary disease) (HCC)    Coronary artery disease    a.) 75% pRCA; 3.5 x 28 mm Cypher DES placed on 07/24/2006   Current use of long term anticoagulation    Clopidogrel    Depression    secondary to the death of her husband (died 70)   Diverticulitis    Diverticulosis    Dizzinesses    Dysphagia    Dyspnea    Dysrhythmia    Fatty infiltration of liver    GERD (gastroesophageal reflux disease)    Headache    History of 2019 novel coronavirus disease (COVID-19) 12/09/2020   History of 2019 novel coronavirus disease (COVID-19) 12/20/2020   Hypertension    Hypertriglyceridemia    ILD (interstitial lung disease) (HCC)    Lump in the abdomen  OSA on CPAP    Overactive bladder    Paroxysmal atrial fibrillation (HCC)    PSVT (paroxysmal supraventricular tachycardia) (HCC)    Spastic colon    T2DM (type 2 diabetes mellitus) (HCC) 05/2008   Thrombocytopenia (HCC)    Tobacco abuse    Venous insufficiency of both lower extremities    Past Surgical History:  Procedure Laterality Date   ABDOMINAL HYSTERECTOMY  with left ovary in place 1996   APPENDECTOMY  1985   gallbladder and Appendix   BREAST BIOPSY Left 10/14/2017   calcs bx, fibrosis giant cell reaction and chronic inflammation, negative for malignancy.    CARPOMETACARPAL (CMC) FUSION OF THUMB Right  07/10/2022   Procedure: CARPOMETACARPAL (CMC) SUSPENSION OF RIGHT THUMB;  Surgeon: Edie Norleen PARAS, MD;  Location: [facility] ORS;  Service: Orthopedics;  Laterality: Right;   CATARACT EXTRACTION, BILATERAL     CESAREAN SECTION  1984   CHOLECYSTECTOMY  1985   COLONOSCOPY WITH PROPOFOL  N/A 09/13/2016   Procedure: COLONOSCOPY WITH PROPOFOL ;  Surgeon: Lamar ONEIDA Holmes, MD;  Location: [facility] ENDOSCOPY;  Service: Endoscopy;  Laterality: N/A;   COLONOSCOPY WITH PROPOFOL  N/A 11/09/2018   Procedure: COLONOSCOPY WITH PROPOFOL ;  Surgeon: Holmes Lamar ONEIDA, MD;  Location: [facility] ENDOSCOPY;  Service: Endoscopy;  Laterality: N/A;   COLONOSCOPY WITH PROPOFOL  N/A 03/29/2020   Procedure: COLONOSCOPY WITH PROPOFOL ;  Surgeon: Dessa Reyes ORN, MD;  Location: [facility] ENDOSCOPY;  Service: Endoscopy;  Laterality: N/A;   CORONARY ANGIOPLASTY WITH STENT PLACEMENT N/A 07/24/2006   75% pRCA; 3.5 x 28 mm Cypher DES placed; Location: [facility]; Surgeons: Margie Lovelace, MD   ESOPHAGOGASTRODUODENOSCOPY (EGD) WITH PROPOFOL  N/A 02/02/2018   Procedure: ESOPHAGOGASTRODUODENOSCOPY (EGD) WITH PROPOFOL ;  Surgeon: Holmes Lamar ONEIDA, MD;  Location: [facility] ENDOSCOPY;  Service: Endoscopy;  Laterality: N/A;   ESOPHAGOGASTRODUODENOSCOPY (EGD) WITH PROPOFOL  N/A 03/29/2020   Procedure: ESOPHAGOGASTRODUODENOSCOPY (EGD) WITH PROPOFOL ;  Surgeon: Dessa Reyes ORN, MD;  Location: [facility] ENDOSCOPY;  Service: Endoscopy;  Laterality: N/A;   EYE SURGERY     JOINT REPLACEMENT     bilateral knee replacements   KNEE ARTHROSCOPY  Arthroscopic left knee surgery    KNEE SURGERY  status post knee surgey    LEFT HEART CATH AND CORONARY ANGIOGRAPHY Left 05/14/2018   Procedure: LEFT HEART CATH AND CORONARY ANGIOGRAPHY;  Surgeon: Hester Wolm PARAS, MD;  Location: [facility] INVASIVE CV LAB;  Service: Cardiovascular;  Laterality: Left;   REPLACEMENT TOTAL KNEE  ([facility])   SHOULDER SURGERY  shoulder operation secondary to a torn tendon   TOTAL HIP ARTHROPLASTY Left 06/12/2021    Procedure: TOTAL HIP ARTHROPLASTY;  Surgeon: Edie Norleen PARAS, MD;  Location: [facility] ORS;  Service: Orthopedics;  Laterality: Left;     Current Meds  Medication Sig   acetaminophen  (TYLENOL ) 500 MG tablet Take 1,000 mg by mouth 2 (two) times daily.   albuterol  (VENTOLIN  HFA) 108 (90 Base) MCG/ACT inhaler INHALE TWO PUFFS FOUR TIMES DAILY (Patient taking differently: 2 (two) times daily. INHALE TWO PUFFS FOUR TIMES DAILY)   amLODipine  (NORVASC ) 5 MG tablet TAKE 1 TABLET BY MOUTH ONCE DAILY   Budeson-Glycopyrrol-Formoterol  (BREZTRI  AEROSPHERE) 160-9-4.8 MCG/ACT AERO Inhale 2 puffs into the lungs in the morning and at bedtime.   CALCIUM  PO Take 600 mg by mouth daily.    cholecalciferol (VITAMIN D3) 25 MCG (1000 UNIT) tablet Take 1,000 Units by mouth daily.   Continuous Blood Gluc Receiver DEVI Use as directed to check blood sugars daily   Continuous Glucose Sensor (DEXCOM G7 SENSOR) MISC Apply 1  sensor every 10 days.   Cyanocobalamin (B-12) 2500 MCG TABS Take 2,500 mcg by mouth daily.   Multiple Vitamin (MULTIVITAMIN) tablet Take 1 tablet by mouth daily.   Multiple Vitamins-Minerals (HAIR SKIN NAILS) CAPS Take 2 capsules by mouth daily.   mupirocin  ointment (BACTROBAN ) 2 % Apply 1 Application topically 2 (two) times daily.   nitroGLYCERIN  (NITROSTAT ) 0.4 MG SL tablet PLACE 1 TABLET UNDER THE TONGUE EVERY 5 MINUTES AS NEEDED FOR CHEST PAIN   nystatin  (MYCOSTATIN /NYSTOP ) powder Apply topically 2 (two) times daily. to affected area(s)   pantoprazole  (PROTONIX ) 40 MG tablet TAKE 1 TABLET TWICE DAILY BEFORE MEALS (Patient taking differently: 2 (two) times daily. TAKE 1 TABLET TWICE DAILY BEFORE MEALS)   pregabalin (LYRICA) 25 MG capsule Take 25 mg by mouth 3 (three) times daily.   propranolol  ER (INDERAL  LA) 80 MG 24 hr capsule TAKE 1 CAPSULE BY MOUTH TWO TIMES DAILY   rOPINIRole  (REQUIP ) 0.5 MG tablet Take 1 tablet (0.5 mg total) by mouth daily after lunch. Take along with 3 mg daily( total of 3.5 mg daily  )   rOPINIRole  (REQUIP ) 3 MG tablet TAKE ONE TABLET BY MOUTH AT BEDTIME. TAKE ALONG WITH 0.5MG  DAILY FOR A TOTAL OF 3.5MG  DAILY   rosuvastatin  (CRESTOR ) 20 MG tablet Take 1 tablet (20 mg total) by mouth daily.   Semaglutide ,0.25 or 0.5MG /DOS, (OZEMPIC , 0.25 OR 0.5 MG/DOSE,) 2 MG/3ML SOPN Inject 0.5 mg into the skin once a week.   telmisartan  (MICARDIS ) 80 MG tablet Take 1 tablet (80 mg total) by mouth daily.   traZODone  (DESYREL ) 100 MG tablet Take 1-2 tablets (100-200 mg total) by mouth at bedtime.   trimethoprim  (TRIMPEX ) 100 MG tablet Take 1 tablet (100 mg total) by mouth daily.   trospium  (SANCTURA ) 20 MG tablet Take 1 tablet (20 mg total) by mouth 2 (two) times daily.   [DISCONTINUED] isosorbide  mononitrate (IMDUR ) 30 MG 24 hr tablet TAKE ONE TABLET BY MOUTH TWICE DAILY     Allergies:   Jardiance [empagliflozin] and Metformin  and related   Social History   Tobacco Use   Smoking status: Every Day    Current packs/day: 1.00    Average packs/day: 1 pack/day for 45.0 years (45.0 ttl pk-yrs)    Types: Cigarettes    Passive exposure: Current   Smokeless tobacco: Never   Tobacco comments:    Smokes 18 cigarettes every day 12/24/22     Trying to quit has cut way back  Vaping Use   Vaping status: Former   Substances: Flavoring  Substance Use Topics   Alcohol use: No    Alcohol/week: 0.0 standard drinks of alcohol   Drug use: No     Family Hx: The patient's family history includes Bipolar disorder in her sister; Cirrhosis in her brother; Colon cancer in her paternal aunt; Depression in her mother; Heart disease in her father; Hepatitis C in her brother; Lung cancer in her mother; Mitral valve prolapse in her mother and sister; Other in her mother. There is no history of Breast cancer, Prostate cancer, Bladder Cancer, or Kidney cancer.  ROS:   Please see the history of present illness.     All other systems reviewed and are negative.   Prior CV studies:   The following studies  were reviewed today:  Echo 02/2023  1. Left ventricular ejection fraction, by estimation, is 60 to 65%. The  left ventricle has normal function. The left ventricle has no regional  wall motion abnormalities. Left ventricular diastolic  parameters were  normal. The average left ventricular  global longitudinal strain is -14.0 %.   2. Right ventricular systolic function is normal. The right ventricular  size is normal. There is normal pulmonary artery systolic pressure. The  estimated right ventricular systolic pressure is 35.7 mmHg.   3. Left atrial size was mildly dilated.   4. The mitral valve is normal in structure. Mild mitral valve  regurgitation. No evidence of mitral stenosis.   5. The aortic valve is tricuspid. Aortic valve regurgitation is not  visualized. No aortic stenosis is present.   6. The inferior vena cava is normal in size with greater than 50%  respiratory variability, suggesting right atrial pressure of 3 mmHg.   Comparison(s): 08/15/20 55%, mild TR/MR.   Heart monitor 02/2022 Event monitor Patch Wear Time:  13 days and 17 hours (2023-03-20T18:56:49-0400 to 2023-04-03T12:09:00-0400)   Normal sinus rhythm Patient had a min HR of 60 bpm, max HR of 179 bpm, and avg HR of 80 bpm.   151 Supraventricular Tachycardia runs occurred, the run with the fastest interval lasting 5 beats with a max rate of 179 bpm, the  longest lasting 21.4 secs with an avg rate of 101 bpm.    Atrial Fibrillation occurred (3% burden), ranging from 65-169 bpm (avg of 105 bpm), the longest lasting 4 hours 4 mins with an avg rate of 98 bpm. Not patient triggered   Isolated SVEs were occasional (2.9%, 45303), SVE Couplets were rare (<1.0%, 721), and SVE Triplets were rare (<1.0%, 696). Isolated VEs were rare (<1.0%, 1283), VE Couplets were rare (<1.0%, 3), and VE Triplets were rare (<1.0%, 2).    Patient triggered events associated with normal sinus rhythm   Signed, Velinda Lunger, MD, Ph.D [facility]    LHC 2019 Dist RCA lesion is 100% stenosed. Post Atrio lesion is 100% stenosed. Ost RPDA to RPDA lesion is 70% stenosed. Mid RCA-1 lesion is 70% stenosed. Mid RCA-2 lesion is 85% stenosed. Prox RCA to Mid RCA lesion is 40% stenosed. Prox RCA lesion is 55% stenosed. Prox Cx to Mid Cx lesion is 30% stenosed. Mid Cx to Dist Cx lesion is 60% stenosed. Ost LAD to Prox LAD lesion is 30% stenosed.   Assessment The patient has had progressive canadian class 3 anginal symptoms with a high probability stress test with risk factors including diabetes, high blood pressure, high cholesterol and smoking.  With inferior myocardial perfusion defect   normal left ventricular function with ejection fraction of 55%   severe 1 vessel coronary artery disease    There is significant stenosis of right coronary artery with significant diffuse coronary artery atherosclerosis throughout the entire length of the right coronary artery including proximal and mid disease throughout prior stent Diffuse and significant occlusion of entire distal right coronary artery with collaterals to PDA and PL from left septal  Labs/Other Tests and Data Reviewed:    EKG:  EKG reviewed from 11/2202 which showed NSR, 68bpm, PRI , no ST/T wave changes  Recent Labs: 01/05/2024: Hemoglobin 14.9; Platelets 123.0   Recent Lipid Panel Lab Results  Component Value Date/Time   CHOL 99 05/14/2023 10:49 AM   TRIG 83.0 05/14/2023 10:49 AM   HDL 37.90 (L) 05/14/2023 10:49 AM   CHOLHDL 3 05/14/2023 10:49 AM   LDLCALC 44 05/14/2023 10:49 AM   LDLDIRECT 103.0 03/30/2021 09:09 AM    Wt Readings from Last 3 Encounters:  05/21/24 184 lb (83.5 kg)  04/15/24 184 lb (83.5 kg)  03/17/24  184 lb (83.5 kg)     Risk Assessment/Calculations:    CHA2DS2-VASc Score = 4   This indicates a 4.8% annual risk of stroke. The patient's score is based upon: CHF History: 0 HTN History: 1 Diabetes History: 0 Stroke History:  0 Vascular Disease History: 1 Age Score: 1 Gender Score: 1         Objective:    Vital Signs:  BP (!) 147/63   Ht 5' 4 (1.626 m)   Wt 184 lb (83.5 kg)   BMI 31.58 kg/m    VITAL SIGNS:  reviewed GEN:  no acute distress  ASSESSMENT & PLAN:    Paroxysmal Afib Patient denies recent Afib episodes. Most recent EKG from 11/2202 showed NSR. She is currently not taking any blood thinners as she is unable to afford Eliquis . Discussed Xarelto and coumadin but she is not interested. She does not want to come in and get samples and is not interested in patient assistance. She understands the risk of stroke. Continue propranolol  ER 80mg  BID.   Near syncope History of near syncope. She denies any lightheadedness or dizziness.  CAD with remote stenting The patient denies chest pain. She is no longer on DOAC due to cost issues, can re-visit when she comes into the office. If she is to stay off DOAC, may need to consider ASA. She has SOB from COPD. Echo 02/2023 showed LVEF 60-65%, no WMA, mild MR/TR.   Smoker COPD She is still smoking 1-2 packs daily.   Substance use Of note, she was positive for cocaine  03/04/24.  Pre-operative evaluation The patient is needing cardiac evaluation for carpal tunnel surgery. Unable to provide clearance today since patient has not been seen in office in over a year and need for EKG.       Time:   Today, I have spent 15 minutes with the patient with telehealth technology discussing the above problems.     Medication Adjustments/Labs and Tests Ordered: Current medicines are reviewed at length with the patient today.  Concerns regarding medicines are outlined above.   Tests Ordered: No orders of the defined types were placed in this encounter.   Medication Changes: No orders of the defined types were placed in this encounter.   Follow Up:  In Person in 6 month(s)  Signed, Juel Bellerose VEAR Fishman, PA-C  05/24/2024 9:43 AM    [facility]

## 2024-05-21 ENCOUNTER — Telehealth: Payer: Self-pay | Admitting: Cardiovascular Disease

## 2024-05-21 ENCOUNTER — Ambulatory Visit: Admitting: Internal Medicine

## 2024-05-21 ENCOUNTER — Ambulatory Visit: Attending: Medical | Admitting: Medical

## 2024-05-21 ENCOUNTER — Encounter: Payer: Self-pay | Admitting: Medical

## 2024-05-21 ENCOUNTER — Other Ambulatory Visit: Payer: Self-pay | Admitting: Cardiovascular Disease

## 2024-05-21 VITALS — BP 147/63 | Ht 64.0 in | Wt 184.0 lb

## 2024-05-21 DIAGNOSIS — Z01810 Encounter for preprocedural cardiovascular examination: Secondary | ICD-10-CM

## 2024-05-21 DIAGNOSIS — I480 Paroxysmal atrial fibrillation: Secondary | ICD-10-CM | POA: Diagnosis not present

## 2024-05-21 DIAGNOSIS — I25118 Atherosclerotic heart disease of native coronary artery with other forms of angina pectoris: Secondary | ICD-10-CM | POA: Diagnosis not present

## 2024-05-21 DIAGNOSIS — F1990 Other psychoactive substance use, unspecified, uncomplicated: Secondary | ICD-10-CM

## 2024-05-21 DIAGNOSIS — R55 Syncope and collapse: Secondary | ICD-10-CM | POA: Diagnosis not present

## 2024-05-21 DIAGNOSIS — F17200 Nicotine dependence, unspecified, uncomplicated: Secondary | ICD-10-CM | POA: Diagnosis not present

## 2024-05-21 DIAGNOSIS — Z01818 Encounter for other preprocedural examination: Secondary | ICD-10-CM

## 2024-05-21 DIAGNOSIS — J449 Chronic obstructive pulmonary disease, unspecified: Secondary | ICD-10-CM

## 2024-05-21 NOTE — Progress Notes (Deleted)
 Cardiology Clinic Note   Date: 05/21/2024 ID: Kathryn Friedman 16-Apr-1956, MRN [id number]  Primary Cardiologist:  Evalene Lunger, MD  Chief Complaint   Kathryn Friedman is a 68 y.o. female who presents to the clinic today for ***  Patient Profile   Kathryn Friedman is followed by *** for the history outlined below.       Past medical history significant for: CAD. Stents x 2 2007. LHC 05/14/2018: Distal RCA 100% with left-to-right collaterals.  Post atrio 100%.  Ostial RPDA to PDA 70%.  Mid RCA #1 70%, #2 85%.  Proximal to mid RCA 40%.  Proximal RCA 55%.  Proximal to mid LCx 30%.  Mid to distal LCx 60%.  Ostial proximal LAD 30%.  Medical management was recommended. PAF. 14-day ZIO 03/06/2022: HR 60 to 179 bpm, average 80 bpm.  151 runs of SVT fastest 5 beats max rate 179 bpm, longest 21.4 seconds average rate 101 bpm.  A-fib occurred 3% with HR 65 to 169 bpm, average 25 bpm, longest run lasting 4 hours 4 minutes with average rate 98 bpm not triggered by patient. Echo 03/25/2023: EF 60 to 65%.  No RWMA.  Normal diastolic parameters.  Normal RV size/function.  Normal PA pressure, RVSP 35.7 mmHg.  Mild LAE.  Mild MR. PSVT. Carotid artery disease. Carotid ultrasound 08/03/2020: Minimal atherosclerotic plaque bilaterally resulting in <50% stenosis bilateral ICAs. Hypertension. Hyperlipidemia. Lipid panel 05/14/2023: LDL 44, HDL 38, TG 83, total 99. COPD. Interstitial lung disease. OSA. T2DM. GERD. DVT. Tobacco abuse. Cocaine  use.   In summary, patient has a history of CAD with stents x 2 in 2007.  She underwent repeat LHC in June 2019 for chest pain showing occluded distal RCA with left-to-right collaterals and nonobstructive disease in LCx and LAD as detailed above.  She had a carotid ultrasound in September 2021 showed minimal plaque as detailed above.  She has a history of DVT following hip replacement surgery in July 2022 and was placed on Eliquis .  She was evaluated in March 2023  for an episode of near syncope in the shower.  She wore a 14-day ZIO in April which demonstrated 3% burden of A-fib and multiple runs of SVT as detailed above.  Echo in April 2024 demonstrated normal LV/RV function as detailed above.   Patient was last seen in the office by Dr. Gollan on 12/24/2022 for routine follow-up.  She denied angina at that time.  She reported difficulty affording Eliquis  and other options were discussed at that time.  Patient had a telephone visit with Cadence Franchester, PA-C on 05/21/2024 secondary to being unable to come into the office due to dyspnea from COPD.  She reported not being on Eliquis  for 6 weeks secondary to cost concerns.  Xarelto and Coumadin were discussed with patient but she declined and verbalized understanding of stroke risk.  Patient needs cardiac risk assessment for upcoming carpal tunnel surgery which was unable to be accommodated through a telephone visit as she has not been seen in person since January 2024.  She was scheduled for an in-office visit.     History of Present Illness    Today, patient ***  CAD Stents x 2 2007.  LHC June 2019 showed occluded distal RCA with left-to-right collaterals, post atrio 100%, ostial RPDA to PDA 70%, mid RCA #1 70%, #2 85%, proximal to mid RCA 40%, proximal RCA 55%, proximal to mid LCx 30%, mid to distal LCx 60%, ostial to proximal LAD 30% recommend medical  management.  Patient*** - Continue amlodipine , isosorbide , propranolol , rosuvastatin , as needed SL NTG.   - Start aspirin ***.   PAF/PSVT 14-day ZIO April 2023 showed 3% burden of A-fib and 151 runs of SVT.  She is not taking Eliquis  secondary to cost considerations.  Xarelto and Coumadin were discussed by a different provider on 05/21/2024 and again today.  Patient states***.  Patient***EKG*** - Continue propranolol .   Hypertension BP today*** - Continue amlodipine , isosorbide , propranolol , telmisartan .   Hyperlipidemia LDL 40 29 April 2023, at goal. -  Continue rosuvastatin .   Tobacco abuse Patient***  Preoperative cardiovascular risk assessment Right endoscopic carpal tunnel release by Dr. Edie.  According to the RCRI, patient has a 0.4-6% risk of MACE. Patient reports activity equivalent to 4.0 METS (***).  -Based on ACC/AHA guidelines, Kathryn Friedman would be at acceptable risk for the planned procedure without further cardiovascular testing.  -***   ROS: All other systems reviewed and are otherwise negative except as noted in History of Present Illness.  EKGs/Labs Reviewed        No results found for requested labs within last 365 days.   01/05/2024: Hemoglobin 14.9; WBC 6.6   No results found for requested labs within last 365 days.   No results found for requested labs within last 365 days.  ***  Risk Assessment/Calculations    {Does this patient have ATRIAL FIBRILLATION?:[phone]}          Physical Exam    VS:  There were no vitals taken for this visit. , BMI There is no height or weight on file to calculate BMI.  GEN: Well nourished, well developed, in no acute distress. Neck: No JVD or carotid bruits. Cardiac: *** RRR. *** No murmur. No rubs or gallops.   Respiratory:  Respirations regular and unlabored. Clear to auscultation without rales, wheezing or rhonchi. GI: Soft, nontender, nondistended. Extremities: Radials/DP/PT 2+ and equal bilaterally. No clubbing or cyanosis. No edema ***  Skin: Warm and dry, no rash. Neuro: Strength intact.  Assessment & Plan   ***  Disposition: ***     {Are you ordering a CV Procedure (e.g. stress test, cath, DCCV, TEE, etc)?   Press F2        :[id number]}   Signed, Barnie HERO. Kaylanie Capili, DNP, NP-C

## 2024-05-21 NOTE — Telephone Encounter (Signed)
 Patient had telephone visit today but we were not able to provide clearance without her coming in to be seen.   Spoke with patient and advised she needs appointment to be seen here in the office. Offered appt with Lauran Fishman PA-C but she requested different provider. Scheduled her to see Barnie Hila NP and will get her EKG at that visit as well. She verbalized understanding with no further questions for now.

## 2024-05-21 NOTE — Telephone Encounter (Signed)
   Pre-operative Risk Assessment    Patient Name: Kathryn Friedman  DOB: Nov 16, 1956 MRN: [id number]   Date of last office visit: unknown Date of next office visit: 05-21-24   Request for Surgical Clearance    Procedure:  Right Endoscopic carpal tunnel release  Date of Surgery:  Clearance TBD                                Surgeon:  Dr. Edie Surgeon's Group or Practice Name:  [facility] Phone number:  [phone] Fax number:  [phone]   Type of Clearance Requested:   - Medical    Type of Anesthesia:  Not Indicated   Additional requests/questions:    SignedBerwyn LELON Sprung   05/21/2024, 9:05 AM

## 2024-05-21 NOTE — Patient Instructions (Signed)
 Medication Instructions:  Your physician recommends that you continue on your current medications as directed. Please refer to the Current Medication list given to you today.   *If you need a refill on your cardiac medications before your next appointment, please call your pharmacy*  Lab Work: No labs ordered today  If you have labs (blood work) drawn today and your tests are completely normal, you will receive your results only by: MyChart Message (if you have MyChart) OR A paper copy in the mail If you have any lab test that is abnormal or we need to change your treatment, we will call you to review the results.  Testing/Procedures: No test ordered today   Follow-Up: At [facility], you and your health needs are our priority.  As part of our continuing mission to provide you with exceptional heart care, our providers are all part of one team.  This team includes your primary Cardiologist (physician) and Advanced Practice Providers or APPs (Physician Assistants and Nurse Practitioners) who all work together to provide you with the care you need, when you need it.  Your next appointment:   6 month(s)  Provider:   Timothy Gollan, MD

## 2024-05-21 NOTE — Telephone Encounter (Signed)
 Notified [facility] that patient will need appointment with provider in order to get clearance. She verbalized understanding and will note that in her chart.

## 2024-05-21 NOTE — Telephone Encounter (Signed)
 Called patient per preop APP Katlyn West, NP needs a EKG for preop clearance called patient to schedule nurse visit but patient can only do to [facility] will send a message to [facility] triage nurse to reach out to patient

## 2024-05-24 ENCOUNTER — Ambulatory Visit: Admitting: Internal Medicine

## 2024-05-24 ENCOUNTER — Encounter: Payer: Self-pay | Admitting: Internal Medicine

## 2024-05-24 ENCOUNTER — Telehealth: Payer: Self-pay | Admitting: Internal Medicine

## 2024-05-24 VITALS — BP 146/74 | HR 59 | Temp 98.3°F | Resp 16 | Ht 64.0 in | Wt 188.0 lb

## 2024-05-24 DIAGNOSIS — G4733 Obstructive sleep apnea (adult) (pediatric): Secondary | ICD-10-CM | POA: Diagnosis not present

## 2024-05-24 DIAGNOSIS — J449 Chronic obstructive pulmonary disease, unspecified: Secondary | ICD-10-CM | POA: Diagnosis not present

## 2024-05-24 DIAGNOSIS — F17210 Nicotine dependence, cigarettes, uncomplicated: Secondary | ICD-10-CM

## 2024-05-24 NOTE — Telephone Encounter (Signed)
 Cpap supply order faxed to [employer]; [phone]. Scanned-Toni
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
## 2024-05-25 ENCOUNTER — Telehealth: Payer: Self-pay | Admitting: Internal Medicine

## 2024-05-25 ENCOUNTER — Ambulatory Visit: Admitting: Student

## 2024-05-25 NOTE — Telephone Encounter (Signed)
 Received Supply Recertification from [employer]. Gave to DSK for signature-Toni

## 2024-05-26 ENCOUNTER — Telehealth: Payer: Self-pay

## 2024-05-26 NOTE — Progress Notes (Deleted)
 Cardiology Clinic Note   Date: 05/26/2024 ID: Sabrinia, Prien 1956/09/25, MRN [id number]  Primary Cardiologist:  Evalene Lunger, MD  Chief Complaint   Kathryn Friedman is a 68 y.o. female who presents to the clinic today for ***  Patient Profile   Kathryn Friedman is followed by *** for the history outlined below.       Past medical history significant for: CAD. Stents x 2 2007. LHC 05/14/2018: Distal RCA 100% with left-to-right collaterals.  Post atrio 100%.  Ostial RPDA to PDA 70%.  Mid RCA #1 70%, #2 85%.  Proximal to mid RCA 40%.  Proximal RCA 55%.  Proximal to mid LCx 30%.  Mid to distal LCx 60%.  Ostial proximal LAD 30%.  Medical management was recommended. PAF. 14-day ZIO 03/06/2022: HR 60 to 179 bpm, average 80 bpm.  151 runs of SVT fastest 5 beats max rate 179 bpm, longest 21.4 seconds average rate 101 bpm.  A-fib occurred 3% with HR 65 to 169 bpm, average 25 bpm, longest run lasting 4 hours 4 minutes with average rate 98 bpm not triggered by patient. Echo 03/25/2023: EF 60 to 65%.  No RWMA.  Normal diastolic parameters.  Normal RV size/function.  Normal PA pressure, RVSP 35.7 mmHg.  Mild LAE.  Mild MR. PSVT. Carotid artery disease. Carotid ultrasound 08/03/2020: Minimal atherosclerotic plaque bilaterally resulting in <50% stenosis bilateral ICAs. Hypertension. Hyperlipidemia. Lipid panel 05/14/2023: LDL 44, HDL 38, TG 83, total 99. COPD. Interstitial lung disease. OSA. T2DM. GERD. DVT. Tobacco abuse. Cocaine  use.   In summary, patient has a history of CAD with stents x 2 in 2007.  She underwent repeat LHC in June 2019 for chest pain showing occluded distal RCA with left-to-right collaterals and nonobstructive disease in LCx and LAD as detailed above.  She had a carotid ultrasound in September 2021 showed minimal plaque as detailed above.  She has a history of DVT following hip replacement surgery in July 2022 and was placed on Eliquis .  She was evaluated in March 2023  for an episode of near syncope in the shower.  She wore a 14-day ZIO in April which demonstrated 3% burden of A-fib and multiple runs of SVT as detailed above.  Echo in April 2024 demonstrated normal LV/RV function as detailed above.   Patient was last seen in the office by Dr. Gollan on 12/24/2022 for routine follow-up.  She denied angina at that time.  She reported difficulty affording Eliquis  and other options were discussed at that time.  Patient had a telephone visit with Cadence Franchester, PA-C on 05/21/2024 secondary to being unable to come into the office due to dyspnea from COPD.  She reported not being on Eliquis  for 6 weeks secondary to cost concerns.  Xarelto and Coumadin were discussed with patient but she declined and verbalized understanding of stroke risk.  Patient needs cardiac risk assessment for upcoming carpal tunnel surgery which was unable to be accommodated through a telephone visit as she has not been seen in person since January 2024.  She was scheduled for an in-office visit.     History of Present Illness    Today, patient ***  CAD Stents x 2 2007.  LHC June 2019 showed occluded distal RCA with left-to-right collaterals, post atrio 100%, ostial RPDA to PDA 70%, mid RCA #1 70%, #2 85%, proximal to mid RCA 40%, proximal RCA 55%, proximal to mid LCx 30%, mid to distal LCx 60%, ostial to proximal LAD 30% recommend medical  management.  Patient*** - Continue amlodipine , isosorbide , propranolol , rosuvastatin , as needed SL NTG.   - Start aspirin ***.   PAF/PSVT 14-day ZIO April 2023 showed 3% burden of A-fib and 151 runs of SVT.  She is not taking Eliquis  secondary to cost considerations.  Xarelto and Coumadin were discussed by a different provider on 05/21/2024 and again today.  Patient states***.  Patient***EKG*** - Continue propranolol .   Hypertension BP today*** - Continue amlodipine , isosorbide , propranolol , telmisartan .   Hyperlipidemia LDL 40 29 April 2023, at goal. -  Continue rosuvastatin .   Tobacco abuse Patient***  Preoperative cardiovascular risk assessment Right endoscopic carpal tunnel release by Dr. Edie.  According to the RCRI, patient has a 0.4-6% risk of MACE. Patient reports activity equivalent to 4.0 METS (***).  -Based on ACC/AHA guidelines, Kathryn Friedman would be at acceptable risk for the planned procedure without further cardiovascular testing.  -***   ROS: All other systems reviewed and are otherwise negative except as noted in History of Present Illness.  EKGs/Labs Reviewed        No results found for requested labs within last 365 days.   01/05/2024: Hemoglobin 14.9; WBC 6.6   No results found for requested labs within last 365 days.   No results found for requested labs within last 365 days.  ***  Risk Assessment/Calculations    {Does this patient have ATRIAL FIBRILLATION?[phone]} No BP recorded.  {Refresh Note OR Click here to enter BP  :1}***        Physical Exam    VS:  There were no vitals taken for this visit. , BMI There is no height or weight on file to calculate BMI.  GEN: Well nourished, well developed, in no acute distress. Neck: No JVD or carotid bruits. Cardiac: *** RRR. *** No murmur. No rubs or gallops.   Respiratory:  Respirations regular and unlabored. Clear to auscultation without rales, wheezing or rhonchi. GI: Soft, nontender, nondistended. Extremities: Radials/DP/PT 2+ and equal bilaterally. No clubbing or cyanosis. No edema ***  Skin: Warm and dry, no rash. Neuro: Strength intact.  Assessment & Plan   ***  Disposition: ***     {Are you ordering a CV Procedure (e.g. stress test, cath, DCCV, TEE, etc)?   Press F2        :[id number]}   Signed, Barnie HERO. Galit Urich, DNP, NP-C

## 2024-05-26 NOTE — Telephone Encounter (Signed)
 Pt's insurance sent a letter for about her medication. States the cheaper alternative would be tolerodine or solifenacin . They mention oxybutynin  but she's tried this and failed. Should we keep her on the trospium  or switch her to one of these? Please advise.

## 2024-05-31 ENCOUNTER — Ambulatory Visit: Admitting: Student

## 2024-05-31 NOTE — Telephone Encounter (Signed)
 Let's keep her on the trospium  for now and keep plans for follow up with Dr. Gaston next week.

## 2024-05-31 NOTE — Progress Notes (Deleted)
 Cardiology Clinic Note   Date: 05/31/2024 ID: Kathryn Friedman 1956-08-19, MRN [id number]  Primary Cardiologist:  Kathryn Lunger, MD  Chief Complaint   Kathryn Friedman is a 68 y.o. female who presents to the clinic today for ***  Patient Profile   Kathryn Friedman is followed by *** for the history outlined below.       Past medical history significant for: CAD. Stents x 2 2007. LHC 05/14/2018: Distal RCA 100% with left-to-right collaterals.  Post atrio 100%.  Ostial RPDA to PDA 70%.  Mid RCA #1 70%, #2 85%.  Proximal to mid RCA 40%.  Proximal RCA 55%.  Proximal to mid LCx 30%.  Mid to distal LCx 60%.  Ostial proximal LAD 30%.  Medical management was recommended. PAF. 14-day ZIO 03/06/2022: HR 60 to 179 bpm, average 80 bpm.  151 runs of SVT fastest 5 beats max rate 179 bpm, longest 21.4 seconds average rate 101 bpm.  A-fib occurred 3% with HR 65 to 169 bpm, average 25 bpm, longest run lasting 4 hours 4 minutes with average rate 98 bpm not triggered by patient. Echo 03/25/2023: EF 60 to 65%.  No RWMA.  Normal diastolic parameters.  Normal RV size/function.  Normal PA pressure, RVSP 35.7 mmHg.  Mild LAE.  Mild MR. PSVT. Carotid artery disease. Carotid ultrasound 08/03/2020: Minimal atherosclerotic plaque bilaterally resulting in <50% stenosis bilateral ICAs. Hypertension. Hyperlipidemia. Lipid panel 05/14/2023: LDL 44, HDL 38, TG 83, total 99. COPD. Interstitial lung disease. OSA. T2DM. GERD. DVT. Tobacco abuse. Cocaine  use.   In summary, patient has a history of CAD with stents x 2 in 2007.  She underwent repeat LHC in June 2019 for chest pain showing occluded distal RCA with left-to-right collaterals and nonobstructive disease in LCx and LAD as detailed above.  She had a carotid ultrasound in September 2021 showed minimal plaque as detailed above.  She has a history of DVT following hip replacement surgery in July 2022 and was placed on Eliquis .  She was evaluated in March 2023  for an episode of near syncope in the shower.  She wore a 14-day ZIO in April which demonstrated 3% burden of A-fib and multiple runs of SVT as detailed above.  Echo in April 2024 demonstrated normal LV/RV function as detailed above.   Patient was last seen in the office by Dr. Gollan on 12/24/2022 for routine follow-up.  She denied angina at that time.  She reported difficulty affording Eliquis  and other options were discussed at that time.  Patient had a telephone visit with Kathryn Franchester, PA-C on 05/21/2024 secondary to being unable to come into the office due to dyspnea from COPD.  She reported not being on Eliquis  for 6 weeks secondary to cost concerns.  Xarelto and Coumadin were discussed with patient but she declined and verbalized understanding of stroke risk.  Patient needs cardiac risk assessment for upcoming carpal tunnel surgery which was unable to be accommodated through a telephone visit as she has not been seen in person since January 2024.  She was scheduled for an in-office visit.     History of Present Illness    Today, patient ***  CAD Stents x 2 2007.  LHC June 2019 showed occluded distal RCA with left-to-right collaterals, post atrio 100%, ostial RPDA to PDA 70%, mid RCA #1 70%, #2 85%, proximal to mid RCA 40%, proximal RCA 55%, proximal to mid LCx 30%, mid to distal LCx 60%, ostial to proximal LAD 30% recommend medical  management.  Patient*** - Continue amlodipine , isosorbide , propranolol , rosuvastatin , as needed SL NTG.   - Start aspirin ***.   PAF/PSVT 14-day ZIO April 2023 showed 3% burden of A-fib and 151 runs of SVT.  She is not taking Eliquis  secondary to cost considerations.  Xarelto and Coumadin were discussed by a different provider on 05/21/2024 and again today.  Patient states***.  Patient***EKG*** - Continue propranolol .   Hypertension BP today*** - Continue amlodipine , isosorbide , propranolol , telmisartan .   Hyperlipidemia LDL 40 29 April 2023, at goal. -  Continue rosuvastatin .   Tobacco abuse Patient***  Preoperative cardiovascular risk assessment Right endoscopic carpal tunnel release by Kathryn Friedman.  According to the RCRI, patient has a 0.4-6% risk of MACE. Patient reports activity equivalent to 4.0 METS (***).  -Based on ACC/AHA guidelines, Kathryn Friedman would be at acceptable risk for the planned procedure without further cardiovascular testing.  -***   ROS: All other systems reviewed and are otherwise negative except as noted in History of Present Illness.  EKGs/Labs Reviewed        No results found for requested labs within last 365 days.   01/05/2024: Hemoglobin 14.9; WBC 6.6   No results found for requested labs within last 365 days.   No results found for requested labs within last 365 days.  ***  Risk Assessment/Calculations    {Does this patient have ATRIAL FIBRILLATION?[phone]} No BP recorded.  {Refresh Note OR Click here to enter BP  :1}***        Physical Exam    VS:  There were no vitals taken for this visit. , BMI There is no height or weight on file to calculate BMI.  GEN: Well nourished, well developed, in no acute distress. Neck: No JVD or carotid bruits. Cardiac: *** RRR. *** No murmur. No rubs or gallops.   Respiratory:  Respirations regular and unlabored. Clear to auscultation without rales, wheezing or rhonchi. GI: Soft, nontender, nondistended. Extremities: Radials/DP/PT 2+ and equal bilaterally. No clubbing or cyanosis. No edema ***  Skin: Warm and dry, no rash. Neuro: Strength intact.  Assessment & Plan   ***  Disposition: ***     {Are you ordering a CV Procedure (e.g. stress test, cath, DCCV, TEE, etc)?   Press F2        :[id number]}   Signed, Kathryn HERO. Saket Hellstrom, DNP, NP-C

## 2024-06-01 ENCOUNTER — Ambulatory Visit: Admitting: Internal Medicine

## 2024-06-01 ENCOUNTER — Telehealth (HOSPITAL_COMMUNITY): Payer: Self-pay | Admitting: Licensed Clinical Social Worker

## 2024-06-01 ENCOUNTER — Telehealth: Payer: Self-pay

## 2024-06-01 ENCOUNTER — Encounter: Payer: Self-pay | Admitting: Internal Medicine

## 2024-06-01 VITALS — BP 114/62 | HR 62 | Temp 97.4°F | Resp 20 | Ht 64.0 in | Wt 192.0 lb

## 2024-06-01 DIAGNOSIS — D696 Thrombocytopenia, unspecified: Secondary | ICD-10-CM

## 2024-06-01 DIAGNOSIS — I2510 Atherosclerotic heart disease of native coronary artery without angina pectoris: Secondary | ICD-10-CM

## 2024-06-01 DIAGNOSIS — J449 Chronic obstructive pulmonary disease, unspecified: Secondary | ICD-10-CM

## 2024-06-01 DIAGNOSIS — I480 Paroxysmal atrial fibrillation: Secondary | ICD-10-CM

## 2024-06-01 DIAGNOSIS — F1420 Cocaine dependence, uncomplicated: Secondary | ICD-10-CM

## 2024-06-01 DIAGNOSIS — E1159 Type 2 diabetes mellitus with other circulatory complications: Secondary | ICD-10-CM | POA: Diagnosis not present

## 2024-06-01 DIAGNOSIS — I10 Essential (primary) hypertension: Secondary | ICD-10-CM

## 2024-06-01 DIAGNOSIS — J849 Interstitial pulmonary disease, unspecified: Secondary | ICD-10-CM

## 2024-06-01 DIAGNOSIS — I700 Atherosclerosis of aorta: Secondary | ICD-10-CM

## 2024-06-01 DIAGNOSIS — K219 Gastro-esophageal reflux disease without esophagitis: Secondary | ICD-10-CM

## 2024-06-01 DIAGNOSIS — I779 Disorder of arteries and arterioles, unspecified: Secondary | ICD-10-CM

## 2024-06-01 DIAGNOSIS — E7800 Pure hypercholesterolemia, unspecified: Secondary | ICD-10-CM | POA: Diagnosis not present

## 2024-06-01 DIAGNOSIS — Z7985 Long-term (current) use of injectable non-insulin antidiabetic drugs: Secondary | ICD-10-CM | POA: Diagnosis not present

## 2024-06-01 DIAGNOSIS — F330 Major depressive disorder, recurrent, mild: Secondary | ICD-10-CM

## 2024-06-01 DIAGNOSIS — F17200 Nicotine dependence, unspecified, uncomplicated: Secondary | ICD-10-CM

## 2024-06-01 LAB — LIPID PANEL
Cholesterol: 130 mg/dL (ref 0–200)
HDL: 49.9 mg/dL (ref >39.00)
LDL Cholesterol: 62 mg/dL (ref 0–99)
NonHDL: 80.14
Total CHOL/HDL Ratio: 3
Triglycerides: 90 mg/dL (ref 0.0–149.0)
VLDL: 18 mg/dL (ref 0.0–40.0)

## 2024-06-01 LAB — TSH: TSH: 0.82 u[IU]/mL (ref 0.35–5.50)

## 2024-06-01 LAB — CBC WITH DIFFERENTIAL/PLATELET
Basophils Absolute: 0 K/uL (ref 0.0–0.1)
Basophils Relative: 0.4 % (ref 0.0–3.0)
Eosinophils Absolute: 0.2 K/uL (ref 0.0–0.7)
Eosinophils Relative: 2.3 % (ref 0.0–5.0)
HCT: 43.3 % (ref 36.0–46.0)
Hemoglobin: 14.5 g/dL (ref 12.0–15.0)
Lymphocytes Relative: 23.9 % (ref 12.0–46.0)
Lymphs Abs: 2.2 K/uL (ref 0.7–4.0)
MCHC: 33.6 g/dL (ref 30.0–36.0)
MCV: 93.8 fl (ref 78.0–100.0)
Monocytes Absolute: 0.5 K/uL (ref 0.1–1.0)
Monocytes Relative: 5.9 % (ref 3.0–12.0)
Neutro Abs: 6.1 K/uL (ref 1.4–7.7)
Neutrophils Relative %: 67.5 % (ref 43.0–77.0)
Platelets: 124 K/uL — ABNORMAL LOW (ref 150.0–400.0)
RBC: 4.62 Mil/uL (ref 3.87–5.11)
RDW: 15.4 % (ref 11.5–15.5)
WBC: 9 K/uL (ref 4.0–10.5)

## 2024-06-01 LAB — MICROALBUMIN / CREATININE URINE RATIO
Creatinine,U: 37.6 mg/dL
Microalb Creat Ratio: UNDETERMINED mg/g (ref 0.0–30.0)
Microalb, Ur: <0.7 mg/dL

## 2024-06-01 LAB — BASIC METABOLIC PANEL WITH GFR
BUN: 13 mg/dL (ref 6–23)
CO2: 26 meq/L (ref 19–32)
Calcium: 9.2 mg/dL (ref 8.4–10.5)
Chloride: 104 meq/L (ref 96–112)
Creatinine, Ser: 0.85 mg/dL (ref 0.40–1.20)
GFR: 70.49 mL/min (ref >60.00)
Glucose, Bld: 115 mg/dL — ABNORMAL HIGH (ref 70–99)
Potassium: 4.2 meq/L (ref 3.5–5.1)
Sodium: 137 meq/L (ref 135–145)

## 2024-06-01 LAB — HEMOGLOBIN A1C: Hgb A1c MFr Bld: 6.6 % — ABNORMAL HIGH (ref 4.6–6.5)

## 2024-06-01 LAB — HEPATIC FUNCTION PANEL
ALT: 24 U/L (ref 0–35)
AST: 22 U/L (ref 0–37)
Albumin: 4.1 g/dL (ref 3.5–5.2)
Alkaline Phosphatase: 65 U/L (ref 39–117)
Bilirubin, Direct: 0.1 mg/dL (ref 0.0–0.3)
Total Bilirubin: 0.4 mg/dL (ref 0.2–1.2)
Total Protein: 6.9 g/dL (ref 6.0–8.3)

## 2024-06-01 NOTE — Telephone Encounter (Signed)
 The therapist receives a message through epic from Dr. Glendia who indicates that she has been Kathryn Friedman's primary care doctor for years.  She asks if this therapist would be available to speak with her by phone which he confirms he is able to do.  Dr. Glendia says that Kathryn Friedman is in her office and is willing to go to detox.  Dr. Glendia says that she is concerned as Kathryn Friedman has been using cocaine  continuously and reports having only about 30 cents remaining and has reportedly not been taking any of her prescribed medications for her health conditions as a result of this.  Dr. Glendia says that it is her understanding that [facility] does not do detox.  The therapist makes her aware of the detox beds at the [facility] in [place] and provides Dr. Glendia with the direct contact number for the reception desk at the [facility] so that she can give report as she wants to assure that Kathryn Friedman will not be turned away.  Zell Maier, MA, LCSW, [facility], LCAS 06/01/2024

## 2024-06-01 NOTE — Telephone Encounter (Signed)
 Pt came in office for visit and was given her Ozmepic PA medication.

## 2024-06-01 NOTE — Patient Instructions (Signed)
 [address] - [employer], [place] 
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
## 2024-06-02 ENCOUNTER — Telehealth: Payer: Self-pay | Admitting: Internal Medicine

## 2024-06-02 NOTE — Telephone Encounter (Addendum)
 Cpap supply order signed, faxed back to [employer]; [phone] & [phone] & scanned-Toni  And emailed to [facility]; [email or web address] [place]

## 2024-06-03 ENCOUNTER — Ambulatory Visit: Admitting: Student

## 2024-06-04 ENCOUNTER — Ambulatory Visit: Payer: Self-pay | Admitting: Internal Medicine

## 2024-06-04 DIAGNOSIS — G4733 Obstructive sleep apnea (adult) (pediatric): Secondary | ICD-10-CM | POA: Diagnosis not present

## 2024-06-06 ENCOUNTER — Encounter: Payer: Self-pay | Admitting: Internal Medicine

## 2024-06-06 ENCOUNTER — Telehealth: Payer: Self-pay | Admitting: Internal Medicine

## 2024-06-06 NOTE — Assessment & Plan Note (Signed)
 Discussed with her today. She has discussed with Dr Eappen as well. She is ready to quit. Regrets starting. Discussed with [facility] and information given to her. Explained she can walk in same day for assessment and establish plan of treatment. She agreed to get someone to take her.

## 2024-06-06 NOTE — Assessment & Plan Note (Signed)
 Continue lipitor  ?

## 2024-06-06 NOTE — Assessment & Plan Note (Signed)
 S/p stent placement.  Continue lipitor. Followed by cardiology.  On eliquis .  Continue f/u with cardiology. Stable. Discussed the need to take her medication regularly.

## 2024-06-06 NOTE — Assessment & Plan Note (Signed)
 On crestor.  Low cholesterol diet and exercise.  Follow lipid panel and liver function tests.

## 2024-06-06 NOTE — Assessment & Plan Note (Signed)
 Low carb diet and exercise. Follow met b and A1c. Need to confirm taking medication as prescribed.

## 2024-06-06 NOTE — Assessment & Plan Note (Signed)
 Sees Dr Theotis. screening with low-dose chest CT without contrast in 12 months.  Mild diffuse bronchial wall thickening with mild centrilobular and paraseptal emphysema; imaging findings suggestive of underlying COPD. Breathing stable.

## 2024-06-06 NOTE — Assessment & Plan Note (Signed)
No upper symptoms reported.  On protonix.   

## 2024-06-06 NOTE — Assessment & Plan Note (Signed)
 Breathing stable. Breztri .

## 2024-06-06 NOTE — Assessment & Plan Note (Signed)
 Continue micardis  80mg  q day and amlodipine  5mg  q day.  Dr Gollan increased propranolol . Blood pressures have been under reasonable control. Follow pressures. Follow metabolic panel. No changes today. Need to confirm taking as prescribed.

## 2024-06-06 NOTE — Telephone Encounter (Signed)
 We discussed her visit. Sueanne, please call Ms Capers and confirm if she plans to f/u with [facility]. Information provided to her during her appt.   Thanks

## 2024-06-06 NOTE — Assessment & Plan Note (Signed)
 Supposed to be on eliquis  currently. Need to confirm taking as prescribed. Stable.  Continue f/u with cardiology as outlined. No changes today.

## 2024-06-06 NOTE — Assessment & Plan Note (Signed)
 Being followed by psychiatry.  On cymbalta . No changes in medication currently. Plan for evaluation at [facility] health.

## 2024-06-06 NOTE — Assessment & Plan Note (Signed)
 Continues to smoke. Have discussed.

## 2024-06-07 ENCOUNTER — Ambulatory Visit: Admitting: Urology

## 2024-06-07 ENCOUNTER — Other Ambulatory Visit: Payer: Self-pay | Admitting: Cardiovascular Disease

## 2024-06-07 DIAGNOSIS — N3281 Overactive bladder: Secondary | ICD-10-CM

## 2024-06-07 MED ORDER — ISOSORBIDE MONONITRATE ER 30 MG PO TB24: 30.0000 mg | ORAL_TABLET | Freq: Two times a day (BID) | ORAL | 0 refills | Status: DC

## 2024-06-07 NOTE — Telephone Encounter (Signed)
 There are intensive outpatient programs as well, where she would not have to spend the night. [facility] offers an outpatient program (she has the information) and there are other outpt programs.

## 2024-06-09 NOTE — Telephone Encounter (Signed)
 Called pt and she wanted to let Dr. Glendia know that she has deleted all the people name and she is going to do this on her own. She stated that she is 1 day clean.

## 2024-06-10 NOTE — Telephone Encounter (Signed)
 Notify her to keep us  posted and let us  know if changes her mind or desires to f/u with one of these programs.

## 2024-06-11 ENCOUNTER — Other Ambulatory Visit: Payer: Self-pay | Admitting: Internal Medicine

## 2024-06-11 DIAGNOSIS — Z1231 Encounter for screening mammogram for malignant neoplasm of breast: Secondary | ICD-10-CM

## 2024-06-19 ENCOUNTER — Encounter: Payer: Self-pay | Admitting: Internal Medicine

## 2024-06-21 ENCOUNTER — Ambulatory Visit: Admitting: Urology

## 2024-06-21 ENCOUNTER — Encounter: Payer: Self-pay | Admitting: Urology

## 2024-06-23 NOTE — Progress Notes (Deleted)
 Cardiology Clinic Note   Date: 06/23/2024 ID: Gwenda, Heiner 02-Mar-1956, MRN [id number]  Primary Cardiologist:  Evalene Lunger, MD  Chief Complaint   Kathryn Friedman is a 68 y.o. female who presents to the clinic today for preoperative cardiovascular examination  Patient Profile   Kathryn Friedman is followed by Dr. Gollan for the history outlined below.       Past medical history significant for: CAD. Stents x 2 2007. LHC 05/14/2018: Distal RCA 100% with left-to-right collaterals.  Post atrio 100%.  Ostial RPDA to PDA 70%.  Mid RCA #1 70%, #2 85%.  Proximal to mid RCA 40%.  Proximal RCA 55%.  Proximal to mid LCx 30%.  Mid to distal LCx 60%.  Ostial proximal LAD 30%.  Medical management was recommended. PAF. 14-day ZIO 03/06/2022: HR 60 to 179 bpm, average 80 bpm.  151 runs of SVT fastest 5 beats max rate 179 bpm, longest 21.4 seconds average rate 101 bpm.  A-fib occurred 3% with HR 65 to 169 bpm, average 25 bpm, longest run lasting 4 hours 4 minutes with average rate 98 bpm not triggered by patient. Echo 03/25/2023: EF 60 to 65%.  No RWMA.  Normal diastolic parameters.  Normal RV size/function.  Normal PA pressure, RVSP 35.7 mmHg.  Mild LAE.  Mild MR. PSVT. Carotid artery disease. Carotid ultrasound 08/03/2020: Minimal atherosclerotic plaque bilaterally resulting in <50% stenosis bilateral ICAs. Hypertension. Hyperlipidemia. Lipid panel 05/14/2023: LDL 44, HDL 38, TG 83, total 99. COPD. Interstitial lung disease. OSA. T2DM. GERD. DVT. Tobacco abuse. Cocaine  use.   In summary, patient has a history of CAD with stents x 2 in 2007.  She underwent repeat LHC in June 2019 for chest pain showing occluded distal RCA with left-to-right collaterals and nonobstructive disease in LCx and LAD as detailed above.  She had a carotid ultrasound in September 2021 showed minimal plaque as detailed above.  She has a history of DVT following hip replacement surgery in July 2022 and was placed  on Eliquis .  She was evaluated in March 2023 for an episode of near syncope in the shower.  She wore a 14-day ZIO in April which demonstrated 3% burden of A-fib and multiple runs of SVT as detailed above.  Echo in April 2024 demonstrated normal LV/RV function as detailed above.   Patient was last seen in the office by Dr. Gollan on 12/24/2022 for routine follow-up.  She denied angina at that time.  She reported difficulty affording Eliquis  and other options were discussed at that time.  Patient had a telephone visit with Cadence Franchester, PA-C on 05/21/2024 secondary to being unable to come into the office due to dyspnea from COPD.  She reported not being on Eliquis  for 6 weeks secondary to cost concerns.  Xarelto and Coumadin were discussed with patient but she declined and verbalized understanding of stroke risk.  Patient needs cardiac risk assessment for upcoming carpal tunnel surgery which was unable to be accommodated through a telephone visit as she has not been seen in person since January 2024.  She was scheduled for an in-office visit.     History of Present Illness    Today, patient ***  CAD Stents x 2 2007.  LHC June 2019 showed occluded distal RCA with left-to-right collaterals, post atrio 100%, ostial RPDA to PDA 70%, mid RCA #1 70%, #2 85%, proximal to mid RCA 40%, proximal RCA 55%, proximal to mid LCx 30%, mid to distal LCx 60%, ostial to proximal LAD  30% recommend medical management.  Patient*** - Continue amlodipine , isosorbide , propranolol , rosuvastatin , as needed SL NTG.   - Start aspirin ***.   PAF/PSVT 14-day ZIO April 2023 showed 3% burden of A-fib and 151 runs of SVT.  She is not taking Eliquis  secondary to cost considerations.  Xarelto and Coumadin were discussed by a different provider on 05/21/2024 and again today.  Patient states***.  Patient***EKG*** - Continue propranolol .   Hypertension BP today*** - Continue amlodipine , isosorbide , propranolol , telmisartan .    Hyperlipidemia LDL 40 29 April 2023, at goal. - Continue rosuvastatin .   Tobacco abuse Patient***  Preoperative cardiovascular risk assessment Right endoscopic carpal tunnel release by Dr. Edie.  According to the RCRI, patient has a 0.4-6% risk of MACE. Patient reports activity equivalent to 4.0 METS (***).  -Based on ACC/AHA guidelines, GALADRIEL SHROFF would be at acceptable risk for the planned procedure without further cardiovascular testing.  -***   ROS: All other systems reviewed and are otherwise negative except as noted in History of Present Illness.  EKGs/Labs Reviewed        06/01/2024: ALT 24; AST 22; BUN 13; Creatinine, Ser 0.85; Potassium 4.2; Sodium 137   06/01/2024: Hemoglobin 14.5; WBC 9.0   06/01/2024: TSH 0.82   No results found for requested labs within last 365 days.  ***  Risk Assessment/Calculations      No BP recorded.  {Refresh Note OR Click here to enter BP  :1}***        Physical Exam    VS:  There were no vitals taken for this visit. , BMI There is no height or weight on file to calculate BMI.  GEN: Well nourished, well developed, in no acute distress. Neck: No JVD or carotid bruits. Cardiac: *** RRR. *** No murmur. No rubs or gallops.   Respiratory:  Respirations regular and unlabored. Clear to auscultation without rales, wheezing or rhonchi. GI: Soft, nontender, nondistended. Extremities: Radials/DP/PT 2+ and equal bilaterally. No clubbing or cyanosis. No edema ***  Skin: Warm and dry, no rash. Neuro: Strength intact.  Assessment & Plan   ***  Disposition: ***     {Are you ordering a CV Procedure (e.g. stress test, cath, DCCV, TEE, etc)?   Press F2        :[id number]}   Signed, Tylene Lunch, NP

## 2024-06-24 ENCOUNTER — Ambulatory Visit: Admitting: Cardiology

## 2024-06-30 ENCOUNTER — Encounter

## 2024-07-11 ENCOUNTER — Other Ambulatory Visit: Payer: Self-pay | Admitting: Psychiatry

## 2024-07-11 DIAGNOSIS — G4701 Insomnia due to medical condition: Secondary | ICD-10-CM

## 2024-07-12 ENCOUNTER — Other Ambulatory Visit: Payer: Self-pay | Admitting: Cardiovascular Disease

## 2024-07-13 ENCOUNTER — Ambulatory Visit: Admitting: Internal Medicine

## 2024-07-14 ENCOUNTER — Telehealth: Payer: Self-pay

## 2024-07-14 NOTE — Telephone Encounter (Signed)
 Received patient assistance Ozempic . Patient is aware and says she will get it at her appt on 9/4.

## 2024-07-15 ENCOUNTER — Other Ambulatory Visit: Payer: Self-pay

## 2024-07-15 DIAGNOSIS — R0602 Shortness of breath: Secondary | ICD-10-CM

## 2024-07-16 ENCOUNTER — Encounter

## 2024-07-23 ENCOUNTER — Encounter: Payer: Self-pay | Admitting: Internal Medicine

## 2024-07-27 LAB — MICROALBUMIN / CREATININE URINE RATIO: Microalb Creat Ratio: <30

## 2024-07-27 LAB — HEMOGLOBIN A1C
Albumin/Creatinine Ratio, Urine, POC: <30
Creatinine, POC: 0.82 mg/dL
EGFR: 78
Hemoglobin A1C: 5.6

## 2024-07-27 NOTE — Telephone Encounter (Signed)
 Ok to change to virtual if this is the only way she can be seen.

## 2024-07-28 ENCOUNTER — Encounter: Payer: Medicare Other | Admitting: Internal Medicine

## 2024-07-29 ENCOUNTER — Telehealth: Admitting: Internal Medicine

## 2024-07-29 ENCOUNTER — Encounter: Payer: Self-pay | Admitting: Internal Medicine

## 2024-07-29 VITALS — BP 126/72 | Temp 96.4°F | Ht 64.0 in | Wt 184.0 lb

## 2024-07-29 DIAGNOSIS — J449 Chronic obstructive pulmonary disease, unspecified: Secondary | ICD-10-CM

## 2024-07-29 DIAGNOSIS — J849 Interstitial pulmonary disease, unspecified: Secondary | ICD-10-CM

## 2024-07-29 DIAGNOSIS — E1159 Type 2 diabetes mellitus with other circulatory complications: Secondary | ICD-10-CM

## 2024-07-29 DIAGNOSIS — I2510 Atherosclerotic heart disease of native coronary artery without angina pectoris: Secondary | ICD-10-CM | POA: Diagnosis not present

## 2024-07-29 DIAGNOSIS — E7800 Pure hypercholesterolemia, unspecified: Secondary | ICD-10-CM

## 2024-07-29 DIAGNOSIS — I480 Paroxysmal atrial fibrillation: Secondary | ICD-10-CM | POA: Diagnosis not present

## 2024-07-29 DIAGNOSIS — K219 Gastro-esophageal reflux disease without esophagitis: Secondary | ICD-10-CM

## 2024-07-29 DIAGNOSIS — F330 Major depressive disorder, recurrent, mild: Secondary | ICD-10-CM

## 2024-07-29 DIAGNOSIS — I779 Disorder of arteries and arterioles, unspecified: Secondary | ICD-10-CM

## 2024-07-29 DIAGNOSIS — I700 Atherosclerosis of aorta: Secondary | ICD-10-CM | POA: Diagnosis not present

## 2024-07-29 DIAGNOSIS — I10 Essential (primary) hypertension: Secondary | ICD-10-CM

## 2024-07-29 DIAGNOSIS — F17200 Nicotine dependence, unspecified, uncomplicated: Secondary | ICD-10-CM

## 2024-07-29 DIAGNOSIS — F1420 Cocaine dependence, uncomplicated: Secondary | ICD-10-CM
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
## 2024-07-30 ENCOUNTER — Telehealth: Payer: Self-pay

## 2024-07-30 NOTE — Progress Notes (Signed)
 Care Guide Pharmacy Note  07/30/2024 Name: Kathryn Friedman MRN: [id number] DOB: May 10, 1956  Referred By: Glendia Shad, MD Reason for referral: Complex Care Management and Call Attempt #1 (Unsuccessful initial outreach to schedule with PHARM D- Manuelita)   Kathryn Friedman is a 68 y.o. year old female who is a primary care patient of Glendia Shad, MD.  Kathryn Friedman was referred to the pharmacist for assistance related to: medication assistance  An unsuccessful telephone outreach was attempted today to contact the patient who was referred to the pharmacy team for assistance with medication assistance. Additional attempts will be made to contact the patient.  Leotis Rase [facility], [facility] Guide  Direct Dial: [phone]  Fax [phone]

## 2024-08-02 ENCOUNTER — Other Ambulatory Visit: Payer: Self-pay | Admitting: Cardiovascular Disease

## 2024-08-02 ENCOUNTER — Encounter: Payer: Self-pay | Admitting: Internal Medicine

## 2024-08-02 ENCOUNTER — Telehealth: Payer: Self-pay | Admitting: Psychiatry

## 2024-08-02 DIAGNOSIS — G4701 Insomnia due to medical condition: Secondary | ICD-10-CM

## 2024-08-02 NOTE — Telephone Encounter (Signed)
 I do not see an appointment scheduled for this patient.  Please schedule an appointment for future refills.

## 2024-08-02 NOTE — Assessment & Plan Note (Signed)
On protonix.  No upper symptoms reported.   

## 2024-08-02 NOTE — Assessment & Plan Note (Signed)
Have discussed the need to quit.  Follow.  

## 2024-08-02 NOTE — Assessment & Plan Note (Signed)
 Continue eliquis . Discussed patient assistance. Continue f/u with cardiology.

## 2024-08-02 NOTE — Telephone Encounter (Signed)
 Patient will need an appointment for any further refills.  A 1 year supply of all of her psychotropics were sent out on 12/09/2023.  It is too soon to send refills at this time.

## 2024-08-02 NOTE — Assessment & Plan Note (Signed)
Continue crestor.  Low cholesterol diet and exercise.  Follow lipid panel.  

## 2024-08-02 NOTE — Assessment & Plan Note (Signed)
 Being followed by psychiatry.  On cymbalta . No changes in medication currently. Continue f/u with psychiatry.

## 2024-08-02 NOTE — Telephone Encounter (Signed)
 Please contact and clarify if she is taking any of the medications from us  ?  Thank you

## 2024-08-02 NOTE — Assessment & Plan Note (Signed)
 Continue micardis  80mg  q day and amlodipine  5mg  q day.  Dr Gollan increased propranolol . Blood pressures have been under reasonable control. Follow pressures. Follow metabolic panel. Blood pressure as outlined today - doing well. No changes in medication.

## 2024-08-02 NOTE — Assessment & Plan Note (Signed)
 Low carb diet and exercise. Follow met b and A1c.   Lab Results  Component Value Date   HGBA1C 6.6 (H) 06/01/2024

## 2024-08-02 NOTE — Progress Notes (Signed)
 Care Guide Pharmacy Note  08/02/2024 Name: [name] MRN: [id number] DOB: 04/17/1956  Referred By: Glendia Shad, MD Reason for referral: Complex Care Management, Call Attempt #1 (Unsuccessful initial outreach to schedule with PHARM DGLENWOOD Shaver), and Call Attempt #2 (Successful initial outreach scheduled with Shaver- PHARM D)   Kathryn Friedman is a 68 y.o. year old female who is a primary care patient of Glendia Shad, MD.  [name] was referred to the pharmacist for assistance related to: DMII  Successful contact was made with the patient to discuss pharmacy services including being ready for the pharmacist to call at least 5 minutes before the scheduled appointment time and to have medication bottles and any blood pressure readings ready for review. The patient agreed to meet with the pharmacist via telephone visit on (date/time). 08/06/24 @ 9 am.  Leotis Rase [facility], [facility] Guide  Direct Dial: [phone]  Fax [phone]   SIG

## 2024-08-02 NOTE — Telephone Encounter (Signed)
 spoke with patient she states she is due to get her refills but that right now she can not afford it.

## 2024-08-02 NOTE — Assessment & Plan Note (Signed)
 Discussed with her today. She has discussed with Dr Eappen as well. Discussed outpatient or inpatient program. Information has been given. She has not used in 5 days. Will notify me if agreeable for program. Follow. Has good family support.

## 2024-08-02 NOTE — Assessment & Plan Note (Signed)
 Continue lipitor and risk factor modification.

## 2024-08-02 NOTE — Assessment & Plan Note (Signed)
 S/p stent placement.  Continue lipitor. Followed by cardiology.  On eliquis.  Continue f/u with cardiology. Stable.

## 2024-08-02 NOTE — Assessment & Plan Note (Signed)
 Continue lipitor  ?

## 2024-08-02 NOTE — Assessment & Plan Note (Signed)
 Sees Dr Theotis. screening with low-dose chest CT without contrast in 12 months.  Mild diffuse bronchial wall thickening with mild centrilobular and paraseptal emphysema; imaging findings suggestive of underlying COPD. Breathing stable.

## 2024-08-02 NOTE — Assessment & Plan Note (Signed)
 Breathing stable.

## 2024-08-04 ENCOUNTER — Telehealth: Payer: Self-pay

## 2024-08-04 DIAGNOSIS — G4701 Insomnia due to medical condition: Secondary | ICD-10-CM

## 2024-08-04 MED ORDER — TRAZODONE HCL 100 MG PO TABS: 100.0000 mg | ORAL_TABLET | Freq: Every day | ORAL | 0 refills | Status: DC

## 2024-08-04 NOTE — Telephone Encounter (Signed)
 pt called states that she needs her trazodone  sent to [facility] she states she been out for 3 days and that she needs some sleep.

## 2024-08-04 NOTE — Telephone Encounter (Signed)
 called pharmacy and canceled rx. according to their records pt never filled rx.

## 2024-08-04 NOTE — Telephone Encounter (Signed)
 I have sent trazodone  to pharmacy [facility].  No future refills without keeping follow-up appointment.

## 2024-08-04 NOTE — Telephone Encounter (Signed)
 Pt was notified.

## 2024-08-05 NOTE — Progress Notes (Unsigned)
   08/06/2024 Name: Kathryn Friedman MRN: [id number] DOB: 1956/01/30  Subjective  Chief Complaint  Patient presents with   Medication Access    Care Team: Primary Care Provider: Glendia Shad, MD  Reason for visit: ?  Kathryn Friedman is a 68 y.o. female who presents today for a telephone visit with the pharmacist due to medication access concerns. ?   Medication Access: ?  Reports that all medications are not affordable.  Unable to afford CGM medications Breztri  an Ozempic  are not costing her anything Cost concerns with Eliquis    Prescription drug coverage: YES Payor: BLUE CROSS BLUE SHIELD MEDICARE / Plan: BCBS MEDICARE / Product Type: *No Product type* / .  Summary of Benefit: $47/month for preferred brand mediation such as Eliquis    Current Patient Assistance: Novo (Ozempic ), [place] (Breztri )  Patient lives in a household of 1 with income including SSI + 1st late husband's SSI, 2nd late husbands pension. Reports total annual income is below $46,950.  Medicare LIS Eligible: No (monthly income exceeds limit)  Assessment and Plan:   1. Medication Access Test claims via [facility] = $0 Breztri  = $45 ? N/A is enrolled in AZ&Me PAP Ozempic  ? N/A is enrolled in Novo PAP   Eliquis  = $45/month. Previously started enrolment for J&J Xarelto through Cardiology   J&J Xarelto Patient Assistance Program  Cardiology office had previously discussed transition to Xarelto with her though patient believes she was denied for the PAP program earlier in January. Reports her annual income is <$47k so she should theoretically be eligible. Renal function remains great. Xarelto remains reasonable alternative for cost if approved. Patient unable to drive to clinic at this time. Message sent to [facility] Med Access team for assistance mailing patient an application Discussed need for 2024 tax documents which patient does believe she has access to.  Once tax documents received, will confirm income  criteria is met and consult w cardiology regarding possible transition to Xarelto   Social Security Deposit Patient overwhelmed currently with change of bank accounts. Reports she has been unable to get SSI direct deposit changed and was told she must do this in person in [place] office. She is unable to drive due to no gas and limited funds. Is fearful of not getting her SSI this month as she is low on funds.  Per SS Website:   Patient agreeable to contacting her bank today to see if they are able to share new Direct Deposit info directly with SS. She was able to find the above statement on her computer at home.   Future Appointments  Date Time Provider Department Center  08/09/2024  2:45 PM Abigail Bernardino HERO, PA-C [facility] None  08/13/2024  9:00 AM Coby Height, MD [facility] None  03/25/2025  9:30 AM [facility] ANNUAL WELLNESS VISIT [facility] [address] FABIENE Geronimo, PharmD Clinical Pharmacist [facility] [phone]

## 2024-08-06 ENCOUNTER — Other Ambulatory Visit (INDEPENDENT_AMBULATORY_CARE_PROVIDER_SITE_OTHER)

## 2024-08-06 ENCOUNTER — Telehealth: Payer: Self-pay

## 2024-08-06 ENCOUNTER — Encounter: Payer: Self-pay | Admitting: Pharmacist

## 2024-08-06 DIAGNOSIS — I480 Paroxysmal atrial fibrillation: Secondary | ICD-10-CM

## 2024-08-06 NOTE — Telephone Encounter (Signed)
 PAP: Patient assistance application for Xarelto through [employer] ([employer]) has been mailed to pt's home address on file. Provider portion of application will be faxed to provider's office.

## 2024-08-06 NOTE — Patient Instructions (Addendum)
 [name],   It was a pleasure to speak with you today! As we discussed:?   We will try to re-submit your application for free Xarelto. If your yearly income is below $46,950, I am hopeful you may be eligible for the program.  We will get this application sent in the mail to your home address. You may return it via mail (or to clinic) along with a copy of any 2024 tax documents / social security income info if not on taxes.    We also discussed contacting your bank today to request that they sent your direct deposit info to social security. Hopefully this will work and save you from having to go to [place].   Contact your bank - You can ask your bank to send your direct deposit information to Social Security using the Automated Enrollment (ENR) process. This lets your bank send your information straight to Social Security, so you don't have to call or go to an office.    Please reach out prior to your next scheduled appointment should you have any questions or concerns.  You may respond directly to this message, or leave me a voicemail at [phone] and I will get back to you shortly.   Thank you!   Future Appointments  Date Time Provider Department Center  08/09/2024  2:45 PM Abigail Bernardino HERO, PA-C [facility] None  08/13/2024  9:00 AM Coby Height, MD [facility] None  03/25/2025  9:30 AM [facility] ANNUAL WELLNESS VISIT [facility] [address] FABIENE Geronimo, PharmD Clinical Pharmacist [facility] [phone]

## 2024-08-06 NOTE — Progress Notes (Signed)
Attempted to contact patient for scheduled appointment for medication management. Left HIPAA compliant message for patient to return my call at their convenience.   

## 2024-08-09 ENCOUNTER — Ambulatory Visit: Admitting: Physician Assistant

## 2024-08-09 ENCOUNTER — Encounter

## 2024-08-09 ENCOUNTER — Encounter: Payer: Self-pay | Admitting: Urology

## 2024-08-10 ENCOUNTER — Other Ambulatory Visit: Payer: Self-pay

## 2024-08-10 MED ORDER — TRIMETHOPRIM 100 MG PO TABS: 100.0000 mg | ORAL_TABLET | Freq: Every day | ORAL | 0 refills | Status: DC

## 2024-08-13 ENCOUNTER — Encounter: Payer: Self-pay | Admitting: Psychiatry

## 2024-08-13 ENCOUNTER — Telehealth (INDEPENDENT_AMBULATORY_CARE_PROVIDER_SITE_OTHER): Admitting: Psychiatry

## 2024-08-13 DIAGNOSIS — G2581 Restless legs syndrome: Secondary | ICD-10-CM

## 2024-08-13 DIAGNOSIS — F1420 Cocaine dependence, uncomplicated: Secondary | ICD-10-CM

## 2024-08-13 DIAGNOSIS — F418 Other specified anxiety disorders: Secondary | ICD-10-CM

## 2024-08-13 DIAGNOSIS — F330 Major depressive disorder, recurrent, mild: Secondary | ICD-10-CM

## 2024-08-13 DIAGNOSIS — G4701 Insomnia due to medical condition: Secondary | ICD-10-CM

## 2024-08-13 DIAGNOSIS — F17200 Nicotine dependence, unspecified, uncomplicated: Secondary | ICD-10-CM

## 2024-08-13 DIAGNOSIS — F17210 Nicotine dependence, cigarettes, uncomplicated: Secondary | ICD-10-CM
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
## 2024-08-15 ENCOUNTER — Encounter: Payer: Self-pay | Admitting: Internal Medicine

## 2024-08-16 ENCOUNTER — Telehealth (HOSPITAL_COMMUNITY): Payer: Self-pay | Admitting: Licensed Clinical Social Worker

## 2024-08-16 ENCOUNTER — Ambulatory Visit: Admitting: Internal Medicine

## 2024-08-16 NOTE — Telephone Encounter (Signed)
 The therapist attempts to reach Kathryn Friedman at the request of Dr. Coby leaving a HIPAA-compliant voicemail with his direct contact numbers.  Zell Maier, MA, LCSW, [facility], LCAS 08/16/2024

## 2024-08-16 NOTE — Telephone Encounter (Signed)
 Please call and clarify pts symptoms and when started and what she has taken.

## 2024-08-16 NOTE — Telephone Encounter (Signed)
 Please call her and notify - I do not mind calling in diflucan  150mg  x 1. May repeat x 1 in three days if symptoms have not resolved but need to clarify. She reports burning and increased urinary frequency. Does she feels she needs to be evaluated - can get her in for evaluation to try and determine etiology of her symptoms.

## 2024-08-16 NOTE — Telephone Encounter (Signed)
 Please advise, per chart review didn't see mention of medication.

## 2024-08-17 ENCOUNTER — Ambulatory Visit: Attending: Nurse Practitioner | Admitting: Nurse Practitioner

## 2024-08-17 ENCOUNTER — Telehealth (HOSPITAL_COMMUNITY): Payer: Self-pay | Admitting: Licensed Clinical Social Worker

## 2024-08-17 ENCOUNTER — Encounter: Payer: Self-pay | Admitting: Nurse Practitioner

## 2024-08-17 VITALS — BP 104/66 | HR 71 | Ht 64.0 in | Wt 190.0 lb

## 2024-08-17 DIAGNOSIS — I10 Essential (primary) hypertension: Secondary | ICD-10-CM

## 2024-08-17 DIAGNOSIS — I2510 Atherosclerotic heart disease of native coronary artery without angina pectoris: Secondary | ICD-10-CM

## 2024-08-17 DIAGNOSIS — F17200 Nicotine dependence, unspecified, uncomplicated: Secondary | ICD-10-CM

## 2024-08-17 DIAGNOSIS — I480 Paroxysmal atrial fibrillation: Secondary | ICD-10-CM

## 2024-08-17 DIAGNOSIS — E1159 Type 2 diabetes mellitus with other circulatory complications: Secondary | ICD-10-CM

## 2024-08-17 DIAGNOSIS — E7800 Pure hypercholesterolemia, unspecified: Secondary | ICD-10-CM

## 2024-08-17 MED ORDER — PROPRANOLOL HCL ER 80 MG PO CP24: 80.0000 mg | ORAL_CAPSULE | Freq: Two times a day (BID) | ORAL | 6 refills | Status: AC

## 2024-08-17 MED ORDER — FLUCONAZOLE 150 MG PO TABS: ORAL_TABLET | ORAL | 0 refills | Status: DC

## 2024-08-17 NOTE — Telephone Encounter (Signed)
 Copied from CRM [id number]. Topic: General - Call Back - No Documentation >> Aug 17, 2024  1:33 PM Shereese L wrote: Reason for CRM: Patient called In to return Dasia call. Adv patient nurse was on lunch  and patient is awaiting a call back

## 2024-08-17 NOTE — Telephone Encounter (Signed)
 Lvm for pt to return call. Okay to relay once relayed please notify office

## 2024-08-17 NOTE — Progress Notes (Signed)
 Office Visit    Patient Name: Kathryn Friedman Date of Encounter: 08/17/2024  Primary Care Provider:  Glendia Shad, MD Primary Cardiologist:  Evalene Lunger, MD  Cardiology APP:  Vivienne Lonni Ingle, NP   Chief Complaint    68 y.o. female with a history of CAD, hypertension, hyperlipidemia, PSVT, diabetes, sleep apnea on CPAP, tobacco abuse, venous insufficiency with lymphedema, anxiety, depression, COPD, and GERD, who presents for CAD and A-fib follow-up.  Past Medical History   Subjective   Past Medical History:  Diagnosis Date   Anemia    Anginal pain    Anxiety    Arthritis    back and knees   Asthma    Bilateral carotid artery stenosis    Blood in stool    Carpal tunnel syndrome, right    Cervical radiculopathy    Chronic diarrhea    COPD (chronic obstructive pulmonary disease) (HCC)    Coronary artery disease    a. 75% pRCA; 3.5 x 28 mm Cypher DES placed on 07/24/2006; b. 04/2018 Cath: LM nl, LAD 30ost/p, LCX 30p/m, 53m/d, RCA 55p, 40p/m ISR, 70/10m, 100d, RPDA 70ost/p, RPDA/RPAV- fill via L->R collats->Med Rx; c. 04/2021 MV: No ischemia/infarct.   Current use of long term anticoagulation    Clopidogrel    Depression    secondary to the death of her husband (died 64)   Diverticulitis    Diverticulosis    Dizzinesses    Dysphagia    Fatty infiltration of liver    GERD (gastroesophageal reflux disease)    Headache    History of 2019 novel coronavirus disease (COVID-19) 12/09/2020   History of 2019 novel coronavirus disease (COVID-19) 12/20/2020   History of echocardiogram    a. 02/2023 Echo: EF 60-65%, no rwma, nl RV fxn, RVSP 35.9mmHg. Mild MR.   Hypertension    Hypertriglyceridemia    ILD (interstitial lung disease) (HCC)    Lump in the abdomen    OSA on CPAP    Overactive bladder    Paroxysmal atrial fibrillation (HCC)    a. CHA2DS2VASc = 5-->eliquis ; b. 01/2022 Zio: 151 SVT runs. 3% Afib burden.   PSVT (paroxysmal supraventricular  tachycardia)    a. 01/2022 Zio: 151 SVT runs - longest 21.4 secs.   Spastic colon    T2DM (type 2 diabetes mellitus) (HCC) 05/2008   Thrombocytopenia    Tobacco abuse    Venous insufficiency of both lower extremities    Past Surgical History:  Procedure Laterality Date   ABDOMINAL HYSTERECTOMY  with left ovary in place 1996   APPENDECTOMY  1985   gallbladder and Appendix   BREAST BIOPSY Left 10/14/2017   calcs bx, fibrosis giant cell reaction and chronic inflammation, negative for malignancy.    CARPOMETACARPAL (CMC) FUSION OF THUMB Right 07/10/2022   Procedure: CARPOMETACARPAL (CMC) SUSPENSION OF RIGHT THUMB;  Surgeon: Edie Norleen PARAS, MD;  Location: [facility] ORS;  Service: Orthopedics;  Laterality: Right;   CATARACT EXTRACTION, BILATERAL     CESAREAN SECTION  1984   CHOLECYSTECTOMY  1985   COLONOSCOPY WITH PROPOFOL  N/A 09/13/2016   Procedure: COLONOSCOPY WITH PROPOFOL ;  Surgeon: Lamar ONEIDA Holmes, MD;  Location: [facility] ENDOSCOPY;  Service: Endoscopy;  Laterality: N/A;   COLONOSCOPY WITH PROPOFOL  N/A 11/09/2018   Procedure: COLONOSCOPY WITH PROPOFOL ;  Surgeon: Holmes Lamar ONEIDA, MD;  Location: [facility] END[facility]COPY;  Service: Endoscopy;  Laterality: N/A;   COLONOSCOPY WITH PROPOFOL  N/A 03/29/2020   Procedure: COLONOSCOPY WITH PROPOFOL ;  Surgeon: Dessa Reyes ORN, MD;  Location: [facility] ENDOSCOPY;  Service: Endoscopy;  Laterality: N/A;   CORONARY ANGIOPLASTY WITH STENT PLACEMENT N/A 07/24/2006   75% pRCA; 3.5 x 28 mm Cypher DES placed; Location: [facility]; Surgeons: Margie Lovelace, MD   ESOPHAGOGASTRODUODENOSCOPY (EGD) WITH PROPOFOL  N/A 02/02/2018   Procedure: ESOPHAGOGASTRODUODENOSCOPY (EGD) WITH PROPOFOL ;  Surgeon: Viktoria Lamar DASEN, MD;  Location: [facility] ENDOSCOPY;  Service: Endoscopy;  Laterality: N/A;   ESOPHAGOGASTRODUODENOSCOPY (EGD) WITH PROPOFOL  N/A 03/29/2020   Procedure: ESOPHAGOGASTRODUODENOSCOPY (EGD) WITH PROPOFOL ;  Surgeon: Dessa Reyes ORN, MD;  Location: [facility] ENDOSCOPY;  Service: Endoscopy;   Laterality: N/A;   EYE SURGERY     JOINT REPLACEMENT     bilateral knee replacements   KNEE ARTHROSCOPY  Arthroscopic left knee surgery    KNEE SURGERY  status post knee surgey    LEFT HEART CATH AND CORONARY ANGIOGRAPHY Left 05/14/2018   Procedure: LEFT HEART CATH AND CORONARY ANGIOGRAPHY;  Surgeon: Hester Wolm PARAS, MD;  Location: [facility] INVASIVE CV LAB;  Service: Cardiovascular;  Laterality: Left;   REPLACEMENT TOTAL KNEE  ([facility])   SHOULDER SURGERY  shoulder operation secondary to a torn tendon   TOTAL HIP ARTHROPLASTY Left 06/12/2021   Procedure: TOTAL HIP ARTHROPLASTY;  Surgeon: Edie Norleen PARAS, MD;  Location: [facility] ORS;  Service: Orthopedics;  Laterality: Left;    Allergies  Allergies  Allergen Reactions   Jardiance [Empagliflozin] Other (See Comments)    Yeast infection   Metformin  And Related Diarrhea    even with XR       History of Present Illness      68 y.o. y/o femalewith a history of CAD, hypertension, hyperlipidemia, PSVT, diabetes, sleep apnea on CPAP, tobacco abuse, venous insufficiency with lymphedema, anxiety, depression, COPD, and GERD.  Patient previously underwent stenting of the right coronary artery x 2 in 2007.  In June 2019, she underwent diagnostic catheterization revealing patent proximal/mid RCA stents with occlusive distal RCA disease and moderate nonobstructive LAD/circumflex disease with left-to-right collaterals and normal LV function (55%).  She was medically managed.  In 2022, she underwent hip surgery and postoperatively developed a DVT.  In the setting of paroxysmal atrial fibrillation, Ms. Douglas has been chronically anticoagulated with Eliquis  (CHA2DS2-VASc equals 5).  Prior event monitoring in 2020 showed a 12% A-fib burden.  Most recent stress test in June 2022 was low risk without ischemia.  F/u monitoring in March 2023 showed a 3% A-fib burden longest 4 hours and 4 minutes at an average rate of 98 bpm) with 151 runs of SVT (the longest lasting 21.4  seconds with the fastest rate of 179).  Most recent echo in April 2024 showed an EF of 60-65% without regional wall motion abnormalities, normal RV function, RVSP of 35.7 mmHg, mildly dilated left atrium, mild MR.    Ms. Burgo was last seen via virtual visit in June 2025, at which time she was doing well but was out of Eliquis , which was refilled.  Over the past 3 months, she notes that she has felt well from a cardiovascular standpoint.  She has a degree of chronic dyspnea on exertion and activity is limited by chronic left hip pain for which she uses a quad cane.  She does not experience chest pain.  About once or twice a month, she may have a brief episode of palpitations that lasts seconds to a minute or so, and resolve spontaneously.  She rarely notes tachycardia and never experiences persistent palpitations.  She denies PND, orthopnea, dizziness, syncope, or early satiety.  She has some  degree of chronic left lower leg swelling.  She says she has been out of propranolol  for about a week and this will be refilled today. Objective   Home Medications    Current Outpatient Medications  Medication Sig Dispense Refill   acetaminophen  (TYLENOL ) 500 MG tablet Take 1,000 mg by mouth 2 (two) times daily.     albuterol  (VENTOLIN  HFA) 108 (90 Base) MCG/ACT inhaler INHALE TWO PUFFS FOUR TIMES DAILY 18 g 1   amLODipine  (NORVASC ) 5 MG tablet TAKE 1 TABLET BY MOUTH ONCE DAILY 90 tablet 3   apixaban  (ELIQUIS ) 5 MG TABS tablet TAKE ONE (1) TABLET BY MOUTH TWO TIMES PER DAY 60 tablet 3   Budeson-Glycopyrrol-Formoterol  (BREZTRI  AEROSPHERE) 160-9-4.8 MCG/ACT AERO Inhale 2 puffs into the lungs in the morning and at bedtime. 32.1 g 3   CALCIUM  PO Take 600 mg by mouth daily.      cholecalciferol (VITAMIN D3) 25 MCG (1000 UNIT) tablet Take 1,000 Units by mouth daily.     Continuous Blood Gluc Receiver DEVI Use as directed to check blood sugars daily 1 each 0   Continuous Glucose Sensor (DEXCOM G7 SENSOR) MISC Apply 1  sensor every 10 days. 3 each 11   Cyanocobalamin (B-12) 2500 MCG TABS Take 2,500 mcg by mouth daily.     DULoxetine  (CYMBALTA ) 60 MG capsule Take 1 capsule (60 mg total) by mouth daily. 90 capsule 3   famotidine  (PEPCID ) 40 MG tablet Take 40 mg by mouth daily.     isosorbide  mononitrate (IMDUR ) 30 MG 24 hr tablet Take 1 tablet (30 mg total) by mouth 2 (two) times daily. 30 tablet 0   methocarbamol (ROBAXIN) 750 MG tablet Take 750 mg by mouth.     Multiple Vitamin (MULTIVITAMIN) tablet Take 1 tablet by mouth daily.     Multiple Vitamins-Minerals (HAIR SKIN NAILS) CAPS Take 2 capsules by mouth daily.     mupirocin  ointment (BACTROBAN ) 2 % Apply 1 Application topically 2 (two) times daily. 22 g 0   nitroGLYCERIN  (NITROSTAT ) 0.4 MG SL tablet PLACE 1 TABLET UNDER THE TONGUE EVERY 5 MINUTES AS NEEDED FOR CHEST PAIN 25 tablet 0   nystatin  (MYCOSTATIN /NYSTOP ) powder Apply topically 2 (two) times daily. to affected area(s) 30 g 0   pantoprazole  (PROTONIX ) 40 MG tablet TAKE 1 TABLET TWICE DAILY BEFORE MEALS (Patient taking differently: 2 (two) times daily. TAKE 1 TABLET TWICE DAILY BEFORE MEALS) 180 tablet 3   rOPINIRole  (REQUIP ) 0.5 MG tablet Take 1 tablet (0.5 mg total) by mouth daily after lunch. Take along with 3 mg daily( total of 3.5 mg daily ) 90 tablet 3   rOPINIRole  (REQUIP ) 3 MG tablet TAKE ONE TABLET BY MOUTH AT BEDTIME. TAKE ALONG WITH 0.5MG  DAILY FOR A TOTAL OF 3.5MG  DAILY 90 tablet 3   rosuvastatin  (CRESTOR ) 20 MG tablet Take 1 tablet (20 mg total) by mouth daily. 90 tablet 3   Semaglutide ,0.25 or 0.5MG /DOS, (OZEMPIC , 0.25 OR 0.5 MG/DOSE,) 2 MG/3ML SOPN Inject 0.5 mg into the skin once a week.     telmisartan  (MICARDIS ) 80 MG tablet Take 1 tablet (80 mg total) by mouth daily. 90 tablet 1   traZODone  (DESYREL ) 100 MG tablet Take 1-2 tablets (100-200 mg total) by mouth at bedtime. No future refills without keeping follow-up appointment. 60 tablet 0   trimethoprim  (TRIMPEX ) 100 MG tablet Take 1  tablet (100 mg total) by mouth daily. 90 tablet 0   trospium  (SANCTURA ) 20 MG tablet Take 1 tablet (20 mg total)  by mouth 2 (two) times daily. 60 tablet 11   propranolol  ER (INDERAL  LA) 80 MG 24 hr capsule Take 1 capsule (80 mg total) by mouth 2 (two) times daily. 60 capsule 6   No current facility-administered medications for this visit.     Physical Exam    VS:  BP 104/66 (BP Location: Left Arm, Patient Position: Sitting, Cuff Size: Normal)   Pulse 71   Ht 5' 4 (1.626 m)   Wt 190 lb (86.2 kg)   SpO2 98%   BMI 32.61 kg/m  , BMI Body mass index is 32.61 kg/m.          GEN: Well nourished, well developed, in no acute distress. HEENT: normal. Neck: Supple, no JVD, carotid bruits, or masses. Cardiac: RRR, no murmurs, rubs, or gallops. No clubbing, cyanosis, trace left lower leg edema.  Radials 2+/PT 2+ and equal bilaterally.  Respiratory:  Respirations regular and unlabored, coarse breath sounds throughout. GI: Soft, nontender, nondistended, BS + x 4. MS: no deformity or atrophy. Skin: warm and dry, no rash. Neuro:  Strength and sensation are intact. Psych: Normal affect.  Accessory Clinical Findings    ECG personally reviewed by me today - EKG Interpretation Date/Time:  Tuesday August 17 2024 10:13:15 EDT Ventricular Rate:  71 PR Interval:  170 QRS Duration:  82 QT Interval:  398 QTC Calculation: 432 R Axis:   2  Text Interpretation: Normal sinus rhythm Possible Anterior infarct (cited on or before 06-Jun-2021) Confirmed by Vivienne Bruckner [phone]) on 08/17/2024 10:38:42 AM  - no acute changes.  Lab Results  Component Value Date   WBC 9.0 06/01/2024   HGB 14.5 06/01/2024   HCT 43.3 06/01/2024   MCV 93.8 06/01/2024   PLT 124.0 (L) 06/01/2024   Lab Results  Component Value Date   CREATININE 0.85 06/01/2024   BUN 13 06/01/2024   NA 137 06/01/2024   K 4.2 06/01/2024   CL 104 06/01/2024   CO2 26 06/01/2024   Lab Results  Component Value Date   ALT 24  06/01/2024   AST 22 06/01/2024   ALKPHOS 65 06/01/2024   BILITOT 0.4 06/01/2024   Lab Results  Component Value Date   CHOL 130 06/01/2024   HDL 49.90 06/01/2024   LDLCALC 62 06/01/2024   LDLDIRECT 103.0 03/30/2021   TRIG 90.0 06/01/2024   CHOLHDL 3 06/01/2024    Lab Results  Component Value Date   HGBA1C 6.6 (H) 06/01/2024   Lab Results  Component Value Date   TSH 0.82 06/01/2024       Assessment & Plan    1.  Coronary artery disease: Status post prior drug-eluting stent placement to the right coronary artery in 2007 with more recent catheterization in 2019 revealing occlusive disease of the distal RCA with left-to-right collaterals.  She had a nonischemic stress test 2022.  That she has some degree of chronic dyspnea exertion in the setting of COPD and ongoing tobacco abuse, she denies chest pain.  Activity is limited by chronic left hip pain.  She remains on beta-blocker (refilled today), statin, long-acting nitrate, and calcium  channel blocker therapy.  No aspirin  in the setting of chronic Eliquis .  2.  Primary hypertension: Blood pressure is well-controlled today at 104/66.  Continue beta-blocker, ARB, and amlodipine .  3.  Hyperlipidemia: LDL cholesterol 62 in July with normal LFTs at that time.  Continue statin therapy.  4.  Paroxysmal atrial fibrillation/PSVT: 3% A-fib burden on monitoring in 2023.  She sometimes notes brief  episodes of palpitations, occurring 1-2 times per month, lasting a minute or so, and resolving spontaneously.  She is in sinus rhythm today.  She remains on Eliquis  in the setting of a CHA2DS2-VASc of 5.  Refilling propranolol  today.  5.  Type 2 diabetes mellitus: A1c 6.6 in July.  She is on semaglutide , ARB, and statin therapy.  6.  Tobacco abuse: Continues to smoke 1 pack/day, down from 2 packs/day.  Complete cessation advised.  7.  Disposition: Follow-up in cardiology clinic in 6 months or sooner if necessary.  Lonni Meager, NP 08/17/2024,  12:40 PM

## 2024-08-17 NOTE — Patient Instructions (Signed)
 Medication Instructions:  Your physician recommends that you continue on your current medications as directed. Please refer to the Current Medication list given to you today.   *If you need a refill on your cardiac medications before your next appointment, please call your pharmacy*  Lab Work: No labs ordered today  If you have labs (blood work) drawn today and your tests are completely normal, you will receive your results only by: MyChart Message (if you have MyChart) OR A paper copy in the mail If you have any lab test that is abnormal or we need to change your treatment, we will call you to review the results.  Testing/Procedures: No test ordered today   Follow-Up: At [facility], you and your health needs are our priority.  As part of our continuing mission to provide you with exceptional heart care, our providers are all part of one team.  This team includes your primary Cardiologist (physician) and Advanced Practice Providers or APPs (Physician Assistants and Nurse Practitioners) who all work together to provide you with the care you need, when you need it.  Your next appointment:   6 month(s)  Provider:   You may see Timothy Gollan, MD or one of the following Advanced Practice Providers on your designated Care Team:   Lonni Meager, NP  We recommend signing up for the patient portal called MyChart.  Sign up information is provided on this After Visit Summary.  MyChart is used to connect with patients for Virtual Visits (Telemedicine).  Patients are able to view lab/test results, encounter notes, upcoming appointments, etc.  Non-urgent messages can be sent to your provider as well.   To learn more about what you can do with MyChart, go to [email or web address].

## 2024-08-17 NOTE — Telephone Encounter (Signed)
 The therapist receives a voicemail from Kathryn Friedman saying that she has two medical appointments this morning but will call back this afternoon as she is interested in SA IOP.  Zell Maier, MA, LCSW, [facility], LCAS 08/17/2024

## 2024-08-17 NOTE — Telephone Encounter (Signed)
 Rx sent in for diflucan . My chart message sent to Ms Rabold.

## 2024-08-18 ENCOUNTER — Telehealth (HOSPITAL_COMMUNITY): Payer: Self-pay | Admitting: Licensed Clinical Social Worker

## 2024-08-18 ENCOUNTER — Telehealth: Payer: Self-pay | Admitting: Psychiatry

## 2024-08-18 NOTE — Telephone Encounter (Signed)
 Received message from substance abuse therapist Mr. Patti.  Long-term plan to start patient on baclofen. Contacted patient to discuss.  Discussed with patient since she is already on the methocarbamol there is a potential for drug to drug interaction with baclofen.  Patient to discuss with provider who is prescribing methocarbamol if they will be willing to change methocarbamol to baclofen.  Patient agrees with plan.

## 2024-08-18 NOTE — Telephone Encounter (Signed)
 The therapist returns Masayo's call leaving another, HIPAA-compliant voicemail.  Zell Maier, MA, LCSW, [facility], LCAS 08/18/2024
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
## 2024-08-19 ENCOUNTER — Telehealth (HOSPITAL_COMMUNITY): Payer: Self-pay | Admitting: Licensed Clinical Social Worker

## 2024-08-19 NOTE — Telephone Encounter (Signed)
 The therapist receives the following email from [place]:  Thank you. I have printed the directions and Map Quested them. As soon as I get up the nerve I will drive that way and see what happens. I also called a local Church about NA meetings just waiting on them to call me back. I talked to Dr. Eappen about the Baclofen. My Orthopedic doctor has me on Methocarbamol for my back and hip pain so I have sent him a message to switch me to the Baclofen just waiting to hear from him. I will keep you updated as I hear back. I am determined to get off this stuff and get my life back. Thanks for everything Bill. See you Tuesday at 11:00.   Sincerely,  Avelina Delores Zell Patti, MA, LCSW, [facility], LCAS 08/19/2024

## 2024-08-19 NOTE — Telephone Encounter (Signed)
 Received patient portion of Pap application back for Xarelto ([employer])-

## 2024-08-20 NOTE — Telephone Encounter (Signed)
 PAP: Application for Xarelto  has been submitted to [employer] ([employer]), via fax

## 2024-08-23 ENCOUNTER — Ambulatory Visit: Admitting: Urology

## 2024-08-23 ENCOUNTER — Ambulatory Visit: Admitting: Internal Medicine

## 2024-08-24 ENCOUNTER — Telehealth (HOSPITAL_COMMUNITY): Payer: Self-pay | Admitting: Licensed Clinical Social Worker

## 2024-08-24 NOTE — Telephone Encounter (Signed)
 The therapist receives voicemails from Kathryn Friedman indicating that she does not know how to log in to today's virtual appoitnment.  The therapist returns her call leaving a HIPAA-compliant voicemail informing her that unfortunately the appointment was not put on the schedule for reasons unknown and that this therapist had someone else in this slot.  The therapist offers to see her today at 1 p.m. if that would work for her.  Zell Maier, MA, LCSW, [facility], LCAS 08/24/2024

## 2024-08-26 DIAGNOSIS — H26493 Other secondary cataract, bilateral: Secondary | ICD-10-CM | POA: Diagnosis not present

## 2024-08-26 DIAGNOSIS — E119 Type 2 diabetes mellitus without complications: Secondary | ICD-10-CM | POA: Diagnosis not present

## 2024-08-26 LAB — OPHTHALMOLOGY REPORT-SCANNED

## 2024-08-30 DIAGNOSIS — M4807 Spinal stenosis, lumbosacral region: Secondary | ICD-10-CM | POA: Diagnosis not present

## 2024-08-30 DIAGNOSIS — M47816 Spondylosis without myelopathy or radiculopathy, lumbar region: Secondary | ICD-10-CM | POA: Diagnosis not present

## 2024-08-30 DIAGNOSIS — M5416 Radiculopathy, lumbar region: Secondary | ICD-10-CM | POA: Diagnosis not present

## 2024-08-30 DIAGNOSIS — Z96642 Presence of left artificial hip joint: Secondary | ICD-10-CM | POA: Diagnosis not present

## 2024-08-31 ENCOUNTER — Encounter: Payer: Self-pay | Admitting: Pharmacist

## 2024-08-31 ENCOUNTER — Ambulatory Visit (INDEPENDENT_AMBULATORY_CARE_PROVIDER_SITE_OTHER): Admitting: Licensed Clinical Social Worker

## 2024-08-31 DIAGNOSIS — F1420 Cocaine dependence, uncomplicated: Secondary | ICD-10-CM

## 2024-08-31 DIAGNOSIS — F331 Major depressive disorder, recurrent, moderate: Secondary | ICD-10-CM

## 2024-08-31 DIAGNOSIS — F4310 Post-traumatic stress disorder, unspecified: Secondary | ICD-10-CM

## 2024-08-31 NOTE — Progress Notes (Signed)
 Chart Review Reason: Patient assistance follow up  Summary: Called [employer] PAP to follow up on application status for Xarelto (faxed late October).  They stated patient was enrolled in 2024, then app sent Jan 2025 though closed due to incomplete. Assured them we did intent to start new application.   Current program status: Missing information  EOB needed (Xarelto test claim)  Test claim via [facility] printed and faxed to [employer] 08/31/24. Confirmation received.

## 2024-08-31 NOTE — Progress Notes (Signed)
 THERAPIST PROGRESS NOTE  Session Time: 11:01 a.m. to 11:43 a.m.   Virtual Visit via Video Note   I connected with Kathryn Friedman at 11:01 a.m. EST by a video enabled telemedicine application and verified that I am speaking with the correct person using two identifiers.   Location: Patient: home Provider: [facility] office   I discussed the limitations of evaluation and management by telemedicine and the availability of in person appointments. The patient expressed understanding and agreed to proceed.  Type of Therapy: Individual   Therapist Response/Interventions: Solution-Focused/The therapist recommends Kathryn Friedman contact the DA regarding the Detective not filing charges as her former friend has no documentation establishing Kathryn Friedman gave her permission to use her routing number and bank account.   He responds to her question about how to deal with cravings by encouraging her to do 90 meetings in 90 days or more and sends her the PDF and audio of the NA Basic text to listen to or read.   The therapist suggests she can drive to [facility] on the weekend to see what the drive is like.   Treatment Goals addressed:  Active     Anxiety     STG: Kathryn Friedman will participate in at least 80% of scheduled individual psychotherapy sessions  (Completed/Met)     Start:  12/20/22    Expected End:  09/22/23    Resolved:  07/06/23      Anxiety  (Not Applicable)     Start:  12/20/22    Expected End:  09/22/23    Resolved:  08/31/24   Reduce overall frequency, intensity and duration of anxiety so that daily functioning is not impaired per pt self report 3 out of 5 sessions.        Work with Kathryn Friedman to track symptoms, triggers, and/or skill use through a mood chart, diary card, or journal     Start:  12/20/22         Perform motivational interviewing regarding physical activity     Start:  12/20/22         Encourage Kathryn Friedman to take psychotropic medication(s) as prescribed     Start:  12/20/22          Perform psychoeducation regarding anxiety disorders     Start:  12/20/22         Provide Kathryn Friedman with educational information and reading material on anxiety, its causes, and symptoms.      Start:  12/20/22         Create a weekly activity schedule     Start:  12/20/22         Perform motivational interviewing regarding use of tools     Start:  12/20/22         Anxiety      Start:  12/20/22      Will work with the pt using CBT/DBT/REBT techniques to help the pt verbalize an understanding of the cognitive, physiological, and behavioral components of anxiety and its treatment. This will be done by using worksheets, interactive activities, CBT/ABC thought logs, modeling, homework, role playing and journaling. Will work with pt to  learn and implement coping skills that result in a reduction of anxiety and improve daily functioning per pt self report 3 out of 5 documented sessions.           BH CCP Acute or Chronic Trauma Reaction     LTG: Recall traumatic events without becoming overwhelmed with negative emotions (Not Applicable)  Start:  12/20/22    Expected End:  09/22/23    Resolved:  08/31/24      Trauma  (Not Applicable)     Start:  12/20/22    Expected End:  09/22/23    Resolved:  08/31/24   Pt will explore coping skills and learn coping and grounding skills to reduce symptoms of PTSD and prepare to handle future stressful situations as evidenced by implementing coping skills per pt self-report 3 out of 5 documented sessions.        STG: Kathryn Friedman will describe the signs and symptoms of PTSD that are experienced and how they interfere with daily living (Not Applicable)     Start:  12/20/22    Expected End:  09/22/23    Resolved:  08/31/24      STG: Kathryn Friedman will identify internal and external stimuli that trigger PTSD symptoms (Completed/Met)     Start:  12/20/22    Expected End:  09/22/23    Resolved:  07/06/23      STG: Kathryn Friedman will acknowledge  that healing from PTSD is a gradual process (Completed/Met)     Start:  12/20/22    Expected End:  09/22/23    Resolved:  05/28/23      STG: Kathryn Friedman will identify coping strategies to deal with trauma memories and the associated emotional reaction (Not Applicable)     Start:  12/20/22    Expected End:  09/22/23    Resolved:  08/31/24      Work with Kathryn Friedman to track symptoms, triggers, and/or skill use through a mood chart, diary card, or journal     Start:  12/20/22         Kathryn Friedman to take psychotropic medication as prescribed     Start:  12/20/22         Provide and outline the treatment process to Kathryn Friedman, explaining that it will include a gradual processing of the details and feelings associated with the trauma and developing new, more appropriate coping strategies     Start:  12/20/22         Educate Kathryn Friedman as to the origins of PTSD, common symptoms, and how it impacts those affected by it     Start:  12/20/22         Educate Kathryn Friedman on common reactions to a traumatic experience     Start:  12/20/22         Assess whether Kathryn Friedman experiences dissociative symptoms (e.g., flashbacks, memory loss, identity disorder), and treat or refer for treatment     Start:  12/20/22         Work with Kathryn Friedman to construct a list of the situations, people, & places that Kathryn Friedman evoke the most distressing symptoms; suggest that they keep a journal of instances of stress being triggered     Start:  12/20/22         Teach Kathryn Friedman coping strategies (e.g., writing down thoughts and feelings in a journal; taking deep, slow breaths; calling a support person to talk about memories) to deal with trauma memories and sudden emotional reactions without becoming emotionally      Start:  12/20/22         Perform motivational interviewing regarding physical activity     Start:  12/20/22           OP Depression     LTG: Reduce frequency, intensity, and duration of  depression symptoms so that daily functioning is improved (Not Progressing)  Start:  12/20/22    Expected End:  09/22/23         LTG: Increase coping skills to manage depression and improve ability to perform daily activities (Not Applicable)     Start:  12/20/22    Expected End:  09/22/23    Resolved:  08/31/24      STG: Kathryn Friedman will participate in at least 80% of scheduled individual psychotherapy sessions  (Completed/Met)     Start:  12/20/22    Expected End:  09/22/23    Resolved:  07/06/23      Depression  (Completed/Met)     Start:  12/20/22    Expected End:  09/22/23    Resolved:  07/06/23   Reduce overall frequency, intensity and duration of depression so that daily functioning is not impaired per pt self report 3 out of 5 sessions documented.        Depression  (Completed/Met)     Start:  12/20/22    Expected End:  09/22/23    Resolved:  07/06/23   Recognize, accept and cope with feelings of depression per pt self report 3 out of 5 sessions.        Grief/Loss (Initial)     Start:  12/20/22    Expected End:  08/18/23      Appropriately grief loses in order to normalize mood and improve symptoms of depression per pt report 3 out 5 sessions.        Work with Kathryn Friedman to track symptoms, triggers, and/or skill use through a mood chart, diary card, or journal     Start:  12/20/22         Provide Kathryn Friedman educational information and reading material on dissociation, its causes, and symptoms     Start:  12/20/22         Therapist will educate patient on cognitive distortions and the rationale for treatment of depression     Start:  12/20/22         Therapist will review PLEASE Skills (Treat Physical Illness, Balance Eating, Avoid Mood-Altering Substances, Balance Sleep and Get Exercise) with patient     Start:  12/20/22         Perform motivational interviewing regarding physical activity     Start:  12/20/22         Depression      Start:  12/20/22       Will work with pt to learn and implement calming skills to reduce overall depression and manage depression symptoms. Some of the techniques that will be used during session will be deep breathing exercises, visual imagery, progressive muscle relaxation, postreduction relative to the benefits of self-care and other breathing exercises.         Self Esteem:     Pt stated that she wants to work on improving her self-esteem     Ability to incorporate positive changes in behavior to improve self-esteem will improve (Initial)     Start:  12/20/22    Expected End:  08/18/23            Self Esteem  (Initial)     Start:  12/20/22    Expected End:  08/18/23      Will Work with patient to decrease the frequency of negative self-descriptive statements and increase the frequency of positive self- descriptive statements using CBT/DBT/REBT techniques per patient self report 3 out of 5 documented sessions.       Identify opportunities to increase self-esteem  Start:  12/20/22            Self- Esteem      Start:  12/20/22      Will Work with patient to decrease the frequency of negative self-descriptive statements and increase the frequency of positive self- descriptive statements using CBT/DBT/REBT techniques per patient self report 3 out of 5 documented sessions. Some of the techniques that will be used will be CBT, positive affirmations, role playing, modeling, homework and journaling.          Substance Use      Kathryn Friedman will abstain from drugs and alcohol per UDS while moving from the contemplation stage of change to the action stage of change and will begin to build sober supports through NA or other sober support programs.  (Progressing)     Start:  03/18/24    Expected End:  10/02/24         The therapist will assist Kathryn Friedman in being able to identify and avoid triggers for using and will utilize motivational interviewing to help her move from contemplation to action.       Start:  03/18/24                  Summary: Kathryn Friedman presents saying that she is now on baclofen saying that it is not helping really. She did attend two virtual NA meetings. She says that the meetings are very informative and that people are very open with their struggles. She has not used crack in four days but has no money so will have to struggle.   Kathryn Friedman says that she is avoiding triggers as she is not going anywhere and has her phone turned off so does not know when someone calls. She will not let anyone in her house. She is upset as they are not going to prosecute Kathryn Friedman.   She says that her mood is up and down but mainly down. She has not journey to [facility] yet. 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 Her doctor is going to send her to get cortisone injections in her back.   The major focus of the session is what she needs to do to not give in to cravings to use.    Progress Towards Goals: Progressing  Suicidal/Homicidal: No SI or HI  Plan: Kathryn Friedman will return again in 1 week. She will continue taking her baclofen and attend at least one virtual meeting per day and listing to the Basic Text of NA. She will call the DA's office about her concerns.    Diagnosis: Cocaine  Use Disorder, Severe; Major Depression, Recurrent, Moderate; PTSD; and Tobacco Use Disorder  Collaboration of Care: Other N/A  Patient/Guardian was advised Release of Information must be obtained prior to any record release in order to collaborate their care with an outside provider. Patient/Guardian was advised if they have not already done so to contact the registration department to sign all necessary forms in order for us  to release information regarding their care.   Consent: Patient/Guardian gives verbal consent for treatment and assignment of benefits for services provided during this visit. Patient/Guardian expressed understanding and agreed to proceed.    Zell Maier, MA, LCSW, [facility], LCAS 08/31/2024

## 2024-09-07 ENCOUNTER — Other Ambulatory Visit: Payer: Self-pay | Admitting: Internal Medicine

## 2024-09-07 ENCOUNTER — Ambulatory Visit (HOSPITAL_COMMUNITY): Admitting: Licensed Clinical Social Worker

## 2024-09-07 ENCOUNTER — Ambulatory Visit (INDEPENDENT_AMBULATORY_CARE_PROVIDER_SITE_OTHER): Admitting: Licensed Clinical Social Worker

## 2024-09-07 DIAGNOSIS — F17210 Nicotine dependence, cigarettes, uncomplicated: Secondary | ICD-10-CM

## 2024-09-07 DIAGNOSIS — F4310 Post-traumatic stress disorder, unspecified: Secondary | ICD-10-CM

## 2024-09-07 DIAGNOSIS — F331 Major depressive disorder, recurrent, moderate: Secondary | ICD-10-CM

## 2024-09-07 DIAGNOSIS — F1420 Cocaine dependence, uncomplicated: Secondary | ICD-10-CM | POA: Diagnosis not present

## 2024-09-07 NOTE — Progress Notes (Signed)
 THERAPIST PROGRESS NOTE  Session Time: 4 p.m. to  5 p.m.  Virtual Visit via Video Note   I connected with Kathryn Friedman at 4 p.m. EST by a video enabled telemedicine application and verified that I am speaking with the correct person using two identifiers.   Location: Patient: home Provider: [facility] office   I discussed the limitations of evaluation and management by telemedicine and the availability of in person appointments. The patient expressed understanding and agreed to proceed.  Type of Therapy: Individual   Therapist Response/Interventions: Solution-Focused and CBT/therapist challenges Tannie's assumption that the DA will not do anything as she has not tried. The therapist encourages her to keep attending meetings and reading the Basic Text and explains how to get a Sponsor. The therapist discusses the importance of sober supports and the need to not give in to isolating. He informs her that recovery takes place in fellowship and people do not normally obtain long-term sobriety on their own. .   Treatment Goals addressed:  Active     Anxiety     STG: Ryelynn will participate in at least 80% of scheduled individual psychotherapy sessions  (Completed/Met)     Start:  12/20/22    Expected End:  09/22/23    Resolved:  07/06/23      Anxiety  (Not Applicable)     Start:  12/20/22    Expected End:  09/22/23    Resolved:  08/31/24   Reduce overall frequency, intensity and duration of anxiety so that daily functioning is not impaired per pt self report 3 out of 5 sessions.        Work with Kathryn Friedman to track symptoms, triggers, and/or skill use through a mood chart, diary card, or journal     Start:  12/20/22         Perform motivational interviewing regarding physical activity     Start:  12/20/22         Encourage Kathryn Friedman to take psychotropic medication(s) as prescribed     Start:  12/20/22         Perform psychoeducation regarding anxiety disorders     Start:   12/20/22         Provide Kathryn Friedman with educational information and reading material on anxiety, its causes, and symptoms.      Start:  12/20/22         Create a weekly activity schedule     Start:  12/20/22         Perform motivational interviewing regarding use of tools     Start:  12/20/22         Anxiety      Start:  12/20/22      Will work with the pt using CBT/DBT/REBT techniques to help the pt verbalize an understanding of the cognitive, physiological, and behavioral components of anxiety and its treatment. This will be done by using worksheets, interactive activities, CBT/ABC thought logs, modeling, homework, role playing and journaling. Will work with pt to  learn and implement coping skills that result in a reduction of anxiety and improve daily functioning per pt self report 3 out of 5 documented sessions.           BH CCP Acute or Chronic Trauma Reaction     LTG: Recall traumatic events without becoming overwhelmed with negative emotions (Not Applicable)     Start:  12/20/22    Expected End:  09/22/23    Resolved:  08/31/24  Trauma  (Not Applicable)     Start:  12/20/22    Expected End:  09/22/23    Resolved:  08/31/24   Pt will explore coping skills and learn coping and grounding skills to reduce symptoms of PTSD and prepare to handle future stressful situations as evidenced by implementing coping skills per pt self-report 3 out of 5 documented sessions.        STG: Armilda will describe the signs and symptoms of PTSD that are experienced and how they interfere with daily living (Not Applicable)     Start:  12/20/22    Expected End:  09/22/23    Resolved:  08/31/24      STG: Kathryn Friedman will identify internal and external stimuli that trigger PTSD symptoms (Completed/Met)     Start:  12/20/22    Expected End:  09/22/23    Resolved:  07/06/23      STG: Kathryn Friedman will acknowledge that healing from PTSD is a gradual process (Completed/Met)     Start:   12/20/22    Expected End:  09/22/23    Resolved:  05/28/23      STG: Kathryn Friedman will identify coping strategies to deal with trauma memories and the associated emotional reaction (Not Applicable)     Start:  12/20/22    Expected End:  09/22/23    Resolved:  08/31/24      Work with Kathryn Friedman to track symptoms, triggers, and/or skill use through a mood chart, diary card, or journal     Start:  12/20/22         Glorious Kathryn Friedman to take psychotropic medication as prescribed     Start:  12/20/22         Provide and outline the treatment process to Tuyet, explaining that it will include a gradual processing of the details and feelings associated with the trauma and developing new, more appropriate coping strategies     Start:  12/20/22         Educate Kathryn Friedman as to the origins of PTSD, common symptoms, and how it impacts those affected by it     Start:  12/20/22         Educate Kathryn Friedman on common reactions to a traumatic experience     Start:  12/20/22         Assess whether Kathryn Friedman experiences dissociative symptoms (e.g., flashbacks, memory loss, identity disorder), and treat or refer for treatment     Start:  12/20/22         Work with Kathryn Friedman to construct a list of the situations, people, & places that [place] evoke the most distressing symptoms; suggest that they keep a journal of instances of stress being triggered     Start:  12/20/22         Teach Kathryn Friedman coping strategies (e.g., writing down thoughts and feelings in a journal; taking deep, slow breaths; calling a support person to talk about memories) to deal with trauma memories and sudden emotional reactions without becoming emotionally      Start:  12/20/22         Perform motivational interviewing regarding physical activity     Start:  12/20/22           OP Depression     LTG: Reduce frequency, intensity, and duration of depression symptoms so that daily functioning is improved (Progressing)      Start:  12/20/22    Expected End:  09/22/23         LTG:  Increase coping skills to manage depression and improve ability to perform daily activities (Not Applicable)     Start:  12/20/22    Expected End:  09/22/23    Resolved:  08/31/24      STG: Kathryn Friedman will participate in at least 80% of scheduled individual psychotherapy sessions  (Completed/Met)     Start:  12/20/22    Expected End:  09/22/23    Resolved:  07/06/23      Depression  (Completed/Met)     Start:  12/20/22    Expected End:  09/22/23    Resolved:  07/06/23   Reduce overall frequency, intensity and duration of depression so that daily functioning is not impaired per pt self report 3 out of 5 sessions documented.        Depression  (Completed/Met)     Start:  12/20/22    Expected End:  09/22/23    Resolved:  07/06/23   Recognize, accept and cope with feelings of depression per pt self report 3 out of 5 sessions.        Grief/Loss (Initial)     Start:  12/20/22    Expected End:  08/18/23      Appropriately grief loses in order to normalize mood and improve symptoms of depression per pt report 3 out 5 sessions.        Work with Kathryn Friedman to track symptoms, triggers, and/or skill use through a mood chart, diary card, or journal     Start:  12/20/22         Provide Kathryn Friedman educational information and reading material on dissociation, its causes, and symptoms     Start:  12/20/22         Therapist will educate patient on cognitive distortions and the rationale for treatment of depression     Start:  12/20/22         Therapist will review PLEASE Skills (Treat Physical Illness, Balance Eating, Avoid Mood-Altering Substances, Balance Sleep and Get Exercise) with patient     Start:  12/20/22         Perform motivational interviewing regarding physical activity     Start:  12/20/22         Depression      Start:  12/20/22      Will work with pt to learn and implement calming skills to reduce overall  depression and manage depression symptoms. Some of the techniques that will be used during session will be deep breathing exercises, visual imagery, progressive muscle relaxation, postreduction relative to the benefits of self-care and other breathing exercises.         Self Esteem:     Pt stated that she wants to work on improving her self-esteem     Ability to incorporate positive changes in behavior to improve self-esteem will improve (Initial)     Start:  12/20/22    Expected End:  08/18/23            Self Esteem  (Initial)     Start:  12/20/22    Expected End:  08/18/23      Will Work with patient to decrease the frequency of negative self-descriptive statements and increase the frequency of positive self- descriptive statements using CBT/DBT/REBT techniques per patient self report 3 out of 5 documented sessions.       Identify opportunities to increase self-esteem     Start:  12/20/22            Self- Esteem  Start:  12/20/22      Will Work with patient to decrease the frequency of negative self-descriptive statements and increase the frequency of positive self- descriptive statements using CBT/DBT/REBT techniques per patient self report 3 out of 5 documented sessions. Some of the techniques that will be used will be CBT, positive affirmations, role playing, modeling, homework and journaling.          Substance Use      Rekita will abstain from drugs and alcohol per UDS while moving from the contemplation stage of change to the action stage of change and will begin to build sober supports through NA or other sober support programs.  (Progressing)     Start:  03/18/24    Expected End:  10/02/24         The therapist will assist Haliey in being able to identify and avoid triggers for using and will utilize motivational interviewing to help her move from contemplation to action.      Start:  03/18/24                     Summary: Andrena presents saying  that best thing this therapist has ever done is giving her the Basic Text as she has read ten chapters. She said that she decided to let go and give it to God and hasn't used. She threw away everything. She has blocked and deleted dealers' phone numbers and is determined to never, ever do it again meaning that she had a slip since last talking with this therapist. She has attended five meetings and has the audio of her Basic Text pulled up all the time. She has meditated several times.  Taygen spoke with Ms. Izetta Coma and set up an appointment. The major focus of today's session involves Davinity's learned helplessness and lack of sober supports and tendency to isolate.  By session's end, Brittiney says that she does want to see justice served and will call the DA as she would feel bad later if she let this go.    Progress Towards Goals: Progressing  Suicidal/Homicidal: No SI or HI  Plan: Twanisha will return again in 1 week..   Diagnosis: Cocaine  Use Disorder, Severe; Major Depression, Recurrent, Moderate; PTSD; and Tobacco Use Disorder  Collaboration of Care: Other N/A  Patient/Guardian was advised Release of Information must be obtained prior to any record release in order to collaborate their care with an outside provider. Patient/Guardian was advised if they have not already done so to contact the registration department to sign all necessary forms in order for us  to release information regarding their care.   Consent: Patient/Guardian gives verbal consent for treatment and assignment of benefits for services provided during this visit. Patient/Guardian expressed understanding and agreed to proceed.   Zell Maier, MA, LCSW, [facility], LCAS 09/07/2024

## 2024-09-09 ENCOUNTER — Encounter: Payer: Self-pay | Admitting: Internal Medicine

## 2024-09-10 ENCOUNTER — Telehealth: Payer: Self-pay | Admitting: Pharmacist

## 2024-09-10 DIAGNOSIS — E1159 Type 2 diabetes mellitus with other circulatory complications: Secondary | ICD-10-CM

## 2024-09-10 NOTE — Telephone Encounter (Signed)
 See me about this. Regarding contacting Kathryn Friedman  - plan.

## 2024-09-10 NOTE — Progress Notes (Signed)
 Brief Telephone Documentation Reason for Call: Patient left message regarding question for pharmacist related to Ozempic    Summary of Call: Patient questions if she should stop Ozempic  since [facility] has reported they will no longer offer it in 2026 and she is taking it infrequently anyway.   Currently, patient is using 0.5 mg intermittently. Notes she checks her blood sugar on Sunday mornings when Ozempic  is due and if it is 100 or below, she skips Ozempic  for the week. Does this as she feels sugars will drop too low with Ozempic .   Notes previously trying Ozempic  at 0.25 mg dose but A1c went up so dose was increased. Clarified with patient, at this time, she was not using Ozempic  weekly. Was also taking it intermittently at the time based on blood sugars.   Currently has 4-5 boxes of Ozempic  in her fridge, and a newer shipment at clinic that she has yet to pick up.   Lab Results  Component Value Date   HGBA1C 5.6 07/27/2024    Considerations A1c very well controlled despite her intermittent/random use of Ozempic  0.5 mg.  Reviewed option of trying 0.25 mg dose but adhering to it every single week to assess glycemic control with regular use. She is open and agreeable. Current supply would likely last her all of 2026 given her estimated ~9 unopened boxes.  Future consideration: Reasonable option for 2026 if patent decides to stop Ozempic , or reverts to irregular use with A1c increase = DPP4i such as Tradjenta. Hopefully, will remain available via PAP program.   Follow Up:  4 weeks with PharmD via phone to discuss Ozezmpic efficacy/adherence    Manuelita FABIENE Kobs, PharmD Clinical Pharmacist [facility] [phone]

## 2024-09-10 NOTE — Telephone Encounter (Signed)
 I sent Kathryn Friedman a secure chat for her assistance

## 2024-09-10 NOTE — Telephone Encounter (Signed)
 Please let ms Mciver know that you have messaged Manuelita.

## 2024-09-10 NOTE — Telephone Encounter (Signed)
 Noted

## 2024-09-13 ENCOUNTER — Other Ambulatory Visit (HOSPITAL_COMMUNITY): Payer: Self-pay

## 2024-09-13 ENCOUNTER — Other Ambulatory Visit: Payer: Self-pay | Admitting: Psychiatry

## 2024-09-13 DIAGNOSIS — G4701 Insomnia due to medical condition: Secondary | ICD-10-CM

## 2024-09-16 ENCOUNTER — Encounter (HOSPITAL_COMMUNITY): Payer: Self-pay

## 2024-09-16 ENCOUNTER — Ambulatory Visit (HOSPITAL_COMMUNITY): Admitting: Licensed Clinical Social Worker

## 2024-09-16 ENCOUNTER — Telehealth (HOSPITAL_COMMUNITY): Payer: Self-pay | Admitting: Licensed Clinical Social Worker

## 2024-09-16 DIAGNOSIS — E119 Type 2 diabetes mellitus without complications: Secondary | ICD-10-CM | POA: Diagnosis not present

## 2024-09-16 DIAGNOSIS — H04123 Dry eye syndrome of bilateral lacrimal glands: Secondary | ICD-10-CM | POA: Diagnosis not present

## 2024-09-16 DIAGNOSIS — H26493 Other secondary cataract, bilateral: Secondary | ICD-10-CM | POA: Diagnosis not present

## 2024-09-16 DIAGNOSIS — H1045 Other chronic allergic conjunctivitis: Secondary | ICD-10-CM | POA: Diagnosis not present

## 2024-09-16 NOTE — Telephone Encounter (Signed)
 Therapist received 2 voicemails from [place] indicating that she will have to cancel her 11:00 appointment today as she is having problems with her eyes such that she is having difficulty keeping them open when in the light.  She says that she has an appointment today at 11:30 AM with her eye doctor and will call back to reschedule.  Zell Maier, MA, LCSW, [facility], LCAS 09/16/2024

## 2024-09-23 ENCOUNTER — Telehealth (HOSPITAL_COMMUNITY): Payer: Self-pay | Admitting: Licensed Clinical Social Worker

## 2024-09-23 DIAGNOSIS — H1045 Other chronic allergic conjunctivitis: Secondary | ICD-10-CM | POA: Diagnosis not present

## 2024-09-23 DIAGNOSIS — E119 Type 2 diabetes mellitus without complications: Secondary | ICD-10-CM | POA: Diagnosis not present

## 2024-09-23 DIAGNOSIS — H26493 Other secondary cataract, bilateral: Secondary | ICD-10-CM | POA: Diagnosis not present

## 2024-09-23 DIAGNOSIS — H04123 Dry eye syndrome of bilateral lacrimal glands: Secondary | ICD-10-CM | POA: Diagnosis not present

## 2024-09-23 NOTE — Telephone Encounter (Signed)
 The therapist receives a return call from Bryce verifying her identity via 2 identifiers.  He schedules her for an appointment next week per her request.  Zell Maier, MA, LCSW, [facility], LCAS 09/23/2024

## 2024-09-23 NOTE — Telephone Encounter (Signed)
 The therapist receives a voicemail from Bryony saying that she left a message a week ago explaining why she had to cancel her appointment questioning if this therapist was going to drop her as she had not received another appointment.  The therapist attempts to reach Avelina and leaves a HIPAA compliant voicemail informing her that the reason she was not rescheduled was that her last message indicated that she would call back to schedule at another time.  The therapist leaves the contact number for the reception desk at this office for her to call and get back on this therapist schedule.  Zell Maier, MA, LCSW, [facility], LCAS 09/23/2024

## 2024-09-27 ENCOUNTER — Encounter: Payer: Self-pay | Admitting: Cardiovascular Disease

## 2024-09-27 ENCOUNTER — Other Ambulatory Visit (HOSPITAL_COMMUNITY): Payer: Self-pay

## 2024-09-27 ENCOUNTER — Telehealth: Payer: Self-pay

## 2024-09-27 NOTE — Telephone Encounter (Signed)
 Patient co-pay for Xarelto is 0.00. will notify patient since PAP application is no longer needed.

## 2024-09-27 NOTE — Telephone Encounter (Signed)
 PAP: Patient assistance application for Breztri  through [employer] ([employer]) has been mailed to pt's home address on file. Provider portion of application will be faxed to provider's office. Patient has been Cond. Approved and reached out to [employer] via phone to consent.

## 2024-09-28 ENCOUNTER — Ambulatory Visit (INDEPENDENT_AMBULATORY_CARE_PROVIDER_SITE_OTHER): Admitting: Licensed Clinical Social Worker

## 2024-09-28 ENCOUNTER — Telehealth: Payer: Self-pay | Admitting: Pharmacist

## 2024-09-28 DIAGNOSIS — F4310 Post-traumatic stress disorder, unspecified: Secondary | ICD-10-CM

## 2024-09-28 DIAGNOSIS — F1420 Cocaine dependence, uncomplicated: Secondary | ICD-10-CM

## 2024-09-28 DIAGNOSIS — F331 Major depressive disorder, recurrent, moderate: Secondary | ICD-10-CM

## 2024-09-28 NOTE — Progress Notes (Signed)
 Reached out to patient's insurance  Called [employer] again. They report all information has been received though they attempted to reach out to patient's insurance plan regarding coverage of Xarelto though insurance would not disclose this information.   Kathryn Friedman is requesting a prior authorization approval/denial.  Explained that Xarelto is a formulary medication for the patient, therefore a prior shara is not required meaning there will be no approval or denial (prior shara is only needed if medication is not covered).   [employer] representative states that a screenshot of the prior authorization portal stating prior auth not required is sufficient.   Prior Auth initiated for Xarelto 20 mg tablet #30 Prior Auth Key: [id number]  Prior authorization report printed and faxed to [employer].

## 2024-09-28 NOTE — Progress Notes (Signed)
 THERAPIST PROGRESS NOTE  Session Time: 1 p.m. to 1:56 p.m.   Virtual Visit via Video Note   I connected with Kathryn Friedman at 1 p.m. EST by a video enabled telemedicine application and verified that I am speaking with the correct person using two identifiers.   Location: Patient: home Provider: [facility] office   I discussed the limitations of evaluation and management by telemedicine and the availability of in person appointments. The patient expressed understanding and agreed to proceed.  Type of Therapy: Individual   Therapist Response/Interventions: Solution-Focused and CBT/therapist validates Kathryn Friedman's feelings about wanting to not interact with people based on her trauma history while at the same time suggesting that this is where a lot of people are when they first entered the program and are in early recovery.  The therapist does make Kathryn Friedman aware of the fact that research indicates that long-term recovery from addiction normally takes place in Fellowship and those who avoid using friends in favor of of isolation are typically at a greater risk of relapse than those who have a sober support system.  Treatment Goals addressed:  Active     Anxiety     STG: Kathryn Friedman will participate in at least 80% of scheduled individual psychotherapy sessions  (Completed/Met)     Start:  12/20/22    Expected End:  09/22/23    Resolved:  07/06/23      Anxiety  (Not Applicable)     Start:  12/20/22    Expected End:  09/22/23    Resolved:  08/31/24   Reduce overall frequency, intensity and duration of anxiety so that daily functioning is not impaired per pt self report 3 out of 5 sessions.        Work with Kathryn Friedman to track symptoms, triggers, and/or skill use through a mood chart, diary card, or journal     Start:  12/20/22         Perform motivational interviewing regarding physical activity     Start:  12/20/22         Encourage Kathryn Friedman to take psychotropic medication(s) as  prescribed     Start:  12/20/22         Perform psychoeducation regarding anxiety disorders     Start:  12/20/22         Provide Kathryn Friedman with educational information and reading material on anxiety, its causes, and symptoms.      Start:  12/20/22         Create a weekly activity schedule     Start:  12/20/22         Perform motivational interviewing regarding use of tools     Start:  12/20/22         Anxiety      Start:  12/20/22      Will work with the pt using CBT/DBT/REBT techniques to help the pt verbalize an understanding of the cognitive, physiological, and behavioral components of anxiety and its treatment. This will be done by using worksheets, interactive activities, CBT/ABC thought logs, modeling, homework, role playing and journaling. Will work with pt to  learn and implement coping skills that result in a reduction of anxiety and improve daily functioning per pt self report 3 out of 5 documented sessions.           BH CCP Acute or Chronic Trauma Reaction     LTG: Recall traumatic events without becoming overwhelmed with negative emotions (Not Applicable)     Start:  12/20/22  Expected End:  09/22/23    Resolved:  08/31/24      Trauma  (Not Applicable)     Start:  12/20/22    Expected End:  09/22/23    Resolved:  08/31/24   Pt will explore coping skills and learn coping and grounding skills to reduce symptoms of PTSD and prepare to handle future stressful situations as evidenced by implementing coping skills per pt self-report 3 out of 5 documented sessions.        STG: Kathryn Friedman will describe the signs and symptoms of PTSD that are experienced and how they interfere with daily living (Not Applicable)     Start:  12/20/22    Expected End:  09/22/23    Resolved:  08/31/24      STG: Kathryn Friedman will identify internal and external stimuli that trigger PTSD symptoms (Completed/Met)     Start:  12/20/22    Expected End:  09/22/23    Resolved:  07/06/23       STG: Kathryn Friedman will acknowledge that healing from PTSD is a gradual process (Completed/Met)     Start:  12/20/22    Expected End:  09/22/23    Resolved:  05/28/23      STG: Kathryn Friedman will identify coping strategies to deal with trauma memories and the associated emotional reaction (Not Applicable)     Start:  12/20/22    Expected End:  09/22/23    Resolved:  08/31/24      Work with Kathryn Friedman to track symptoms, triggers, and/or skill use through a mood chart, diary card, or journal     Start:  12/20/22         Kathryn Friedman to take psychotropic medication as prescribed     Start:  12/20/22         Provide and outline the treatment process to Kathryn Friedman, explaining that it will include a gradual processing of the details and feelings associated with the trauma and developing new, more appropriate coping strategies     Start:  12/20/22         Educate Kathryn Friedman as to the origins of PTSD, common symptoms, and how it impacts those affected by it     Start:  12/20/22         Educate Kathryn Friedman on common reactions to a traumatic experience     Start:  12/20/22         Assess whether Kathryn Friedman experiences dissociative symptoms (e.g., flashbacks, memory loss, identity disorder), and treat or refer for treatment     Start:  12/20/22         Work with Kathryn Friedman to construct a list of the situations, people, & places that Kathryn Friedman evoke the most distressing symptoms; suggest that they keep a journal of instances of stress being triggered     Start:  12/20/22         Teach Kathryn Friedman coping strategies (e.g., writing down thoughts and feelings in a journal; taking deep, slow breaths; calling a support person to talk about memories) to deal with trauma memories and sudden emotional reactions without becoming emotionally      Start:  12/20/22         Perform motivational interviewing regarding physical activity     Start:  12/20/22           OP Depression     LTG: Reduce frequency,  intensity, and duration of depression symptoms so that daily functioning is improved (Progressing)     Start:  12/20/22  Expected End:  03/28/25         LTG: Increase coping skills to manage depression and improve ability to perform daily activities (Not Applicable)     Start:  12/20/22    Expected End:  09/22/23    Resolved:  08/31/24      STG: Kathryn Friedman will participate in at least 80% of scheduled individual psychotherapy sessions  (Completed/Met)     Start:  12/20/22    Expected End:  09/22/23    Resolved:  07/06/23      Depression  (Completed/Met)     Start:  12/20/22    Expected End:  09/22/23    Resolved:  07/06/23   Reduce overall frequency, intensity and duration of depression so that daily functioning is not impaired per pt self report 3 out of 5 sessions documented.        Depression  (Completed/Met)     Start:  12/20/22    Expected End:  09/22/23    Resolved:  07/06/23   Recognize, accept and cope with feelings of depression per pt self report 3 out of 5 sessions.        Grief/Loss (Initial)     Start:  12/20/22    Expected End:  03/28/25      Appropriately grief loses in order to normalize mood and improve symptoms of depression per pt report 3 out 5 sessions.        Work with Kathryn Friedman to track symptoms, triggers, and/or skill use through a mood chart, diary card, or journal     Start:  12/20/22         Provide Kathryn Friedman educational information and reading material on dissociation, its causes, and symptoms     Start:  12/20/22         Therapist will educate patient on cognitive distortions and the rationale for treatment of depression     Start:  12/20/22         Therapist will review PLEASE Skills (Treat Physical Illness, Balance Eating, Avoid Mood-Altering Substances, Balance Sleep and Get Exercise) with patient     Start:  12/20/22         Perform motivational interviewing regarding physical activity     Start:  12/20/22         Depression       Start:  12/20/22      Will work with pt to learn and implement calming skills to reduce overall depression and manage depression symptoms. Some of the techniques that will be used during session will be deep breathing exercises, visual imagery, progressive muscle relaxation, postreduction relative to the benefits of self-care and other breathing exercises.         Self Esteem:     Pt stated that she wants to work on improving her self-esteem     Ability to incorporate positive changes in behavior to improve self-esteem will improve (Initial)     Start:  12/20/22    Expected End:  03/28/25            Self Esteem  (Initial)     Start:  12/20/22    Expected End:  03/28/25      Will Work with patient to decrease the frequency of negative self-descriptive statements and increase the frequency of positive self- descriptive statements using CBT/DBT/REBT techniques per patient self report 3 out of 5 documented sessions.       Identify opportunities to increase self-esteem     Start:  12/20/22  Self- Esteem      Start:  12/20/22      Will Work with patient to decrease the frequency of negative self-descriptive statements and increase the frequency of positive self- descriptive statements using CBT/DBT/REBT techniques per patient self report 3 out of 5 documented sessions. Some of the techniques that will be used will be CBT, positive affirmations, role playing, modeling, homework and journaling.          Substance Use      Kathryn Friedman will abstain from drugs and alcohol per UDS while moving from the contemplation stage of change to the action stage of change and will begin to build sober supports through NA or other sober support programs.  (Progressing)     Start:  03/18/24    Expected End:  10/02/24         The therapist will assist Kathryn Friedman in being able to identify and avoid triggers for using and will utilize motivational interviewing to help her move from  contemplation to action.      Start:  03/18/24                        Summary: Kathryn Friedman presents today saying that she spoke with the district attorney who indicated that he was not able to proceed with her case as it is Kathryn Friedman's word against her former friend's word.  So, she says that she is just going to let go of the situation.  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  She goes for her onboarding in the near future.  She reports that she continues to not use cocaine  but admits that she has struggled with it wanting it so bad on several occasions but could not get it as she has gotten rid of all of her numbers.  When asked about meeting attendance, she admits that she has not attended many meetings may be going to a couple per week.  She has listen to the entire audio of the basic text and is listening to it once again as the meditations in the book in particular help to relax her.  Kathryn Friedman is not particularly interested in meeting attendance as she has always been a loner person.  She attributes this to her past history of trauma saying that she was a person who always let everybody walk all over her.  She says that her other therapist indicated that her tendency to isolate was not healthy with Kathryn Friedman informing this therapist that she is not interested in making any changes at this time and that what she is doing to remain sober from cocaine  is working so far.  As she concludes that she is doing well at this point in time, at the end of the session, she opts to not schedule a return appointment but to call this therapist again in the future to schedule on an as-needed basis.  She reports that her mood is stable at present.   Progress Towards Goals: Not progressing in terms of building sober supports but indicates that she is not using cocaine .  Suicidal/Homicidal:  No SI or HI  Plan: Kathryn Friedman will schedule again on p.r.n. basis  Diagnosis: Cocaine  Use Disorder, Severe; Major Depression, Recurrent, Moderate; PTSD; and Tobacco Use Disorder  Collaboration of Care: Other N/A  Patient/Guardian was advised Release of Information must be obtained prior to any record  release in order to collaborate their care with an outside provider. Patient/Guardian was advised if they have not already done so to contact the registration department to sign all necessary forms in order for us  to release information regarding their care.   Consent: Patient/Guardian gives verbal consent for treatment and assignment of benefits for services provided during this visit. Patient/Guardian expressed understanding and agreed to proceed.   Zell Maier, MA, LCSW, [facility], LCAS 09/28/2024

## 2024-09-28 NOTE — Telephone Encounter (Signed)
 Received placed in folder

## 2024-09-29 NOTE — Telephone Encounter (Signed)
 Form faxed

## 2024-09-29 NOTE — Telephone Encounter (Signed)
 Signed and placed in box.

## 2024-09-30 ENCOUNTER — Telehealth: Admitting: Physician Assistant

## 2024-09-30 ENCOUNTER — Ambulatory Visit: Payer: Self-pay

## 2024-09-30 DIAGNOSIS — R21 Rash and other nonspecific skin eruption: Secondary | ICD-10-CM | POA: Diagnosis not present

## 2024-09-30 MED ORDER — TRIAMCINOLONE ACETONIDE 0.1 % EX CREA: TOPICAL_CREAM | CUTANEOUS | 0 refills | Status: AC

## 2024-09-30 MED ORDER — PREDNISONE 20 MG PO TABS: 20.0000 mg | ORAL_TABLET | Freq: Two times a day (BID) | ORAL | 0 refills | Status: AC

## 2024-09-30 NOTE — Telephone Encounter (Signed)
 FYI Only or Action Required?: FYI only for provider: virtual UC.  Patient was last seen in primary care on 07/29/2024 by Kathryn Shad, MD.  Called Nurse Triage reporting Allergic Reaction and Pruritis.  Symptoms began yesterday.  Interventions attempted: OTC medications: loratidine.  Symptoms are: unchanged.  Triage Disposition: See Physician Within 24 Hours  Patient/caregiver understands and will follow disposition?: Yes        Copied from CRM [id number]. Topic: Clinical - Red Word Triage >> Sep 30, 2024  8:38 AM Kathryn Friedman wrote: Red Word that prompted transfer to Nurse Triage: Patient stated her and her dog are having an allergic reaction, she took two allergy pills and unable to drive and is itching. Reason for Disposition  SEVERE itching (i.e., interferes with sleep, normal activities or school)  Answer Assessment - Initial Assessment Questions 1. APPEARANCE: What does the rash look like?      Broke out everywhere 2. LOCATION: Where is the rash located?      Neck down 3. NUMBER: How many hives are there?      numerous 4. SIZE: How big are the hives? (e.g., inches, cm, compare to coins) Do they all look the same or do they vary in shape and size?      Denies hives, red, flat itchy rash 5. ONSET: When did the hives begin? (e.g., hours or days ago)      yesterday 6. ITCHING: Does it itch? If Yes, ask: How bad is the itch?  (e.g., none, mild, moderate, severe)     Moderate-severe 7. RECURRENT PROBLEM: Have you had hives before? If Yes, ask: When was the last time? and What happened that time?      N/a 8. TRIGGERS: Were you exposed to any new food, plant, cosmetic product or animal just before the hives began?     denies 9. OTHER SYMPTOMS: Do you have any other symptoms? (e.g., fever, tongue swelling, difficulty breathing, abdomen pain)     denies 10. PREGNANCY: Is there any chance you are pregnant? When was your last menstrual period?        N/a  Protocols used: Hives-A-AH, Rash or Redness - [facility]

## 2024-09-30 NOTE — Patient Instructions (Signed)
 Kathryn Friedman, thank you for joining Jadon Harbaugh, PA-C for today's virtual visit.  While this provider is not your primary care provider (PCP), if your PCP is located in our provider database this encounter information will be shared with them immediately following your visit.   A [place] MyChart account gives you access to today's visit and all your visits, tests, and labs performed at [facility]  click here if you don't have a [facility] MyChart account or go to [email or web address]  Consent: (Patient) Kathryn Friedman provided verbal consent for this virtual visit at the beginning of the encounter.  Current Medications:  Current Outpatient Medications:    predniSONE  (DELTASONE ) 20 MG tablet, Take 1 tablet (20 mg total) by mouth 2 (two) times daily with a meal for 5 days., Disp: 10 tablet, Rfl: 0   triamcinolone  cream (KENALOG ) 0.1 %, Apply twice a day to the larger areas such as chest, lower back.  Avoid use over face and genital area, Disp: 45 g, Rfl: 0   acetaminophen  (TYLENOL ) 500 MG tablet, Take 1,000 mg by mouth 2 (two) times daily., Disp: , Rfl:    albuterol  (VENTOLIN  HFA) 108 (90 Base) MCG/ACT inhaler, INHALE TWO PUFFS FOUR TIMES DAILY, Disp: 18 g, Rfl: 1   amLODipine  (NORVASC ) 5 MG tablet, TAKE 1 TABLET BY MOUTH ONCE DAILY, Disp: 90 tablet, Rfl: 3   apixaban  (ELIQUIS ) 5 MG TABS tablet, TAKE ONE (1) TABLET BY MOUTH TWO TIMES PER DAY, Disp: 60 tablet, Rfl: 3   Budeson-Glycopyrrol-Formoterol  (BREZTRI  AEROSPHERE) 160-9-4.8 MCG/ACT AERO, Inhale 2 puffs into the lungs in the morning and at bedtime., Disp: 32.1 g, Rfl: 3   CALCIUM  PO, Take 600 mg by mouth daily. , Disp: , Rfl:    cholecalciferol (VITAMIN D3) 25 MCG (1000 UNIT) tablet, Take 1,000 Units by mouth daily., Disp: , Rfl:    Continuous Blood Gluc Receiver DEVI, Use as directed to check blood sugars daily, Disp: 1 each, Rfl: 0   Continuous Glucose Sensor (DEXCOM G7 SENSOR) MISC, Apply 1 sensor every 10  days., Disp: 3 each, Rfl: 11   Cyanocobalamin (B-12) 2500 MCG TABS, Take 2,500 mcg by mouth daily., Disp: , Rfl:    DULoxetine  (CYMBALTA ) 60 MG capsule, Take 1 capsule (60 mg total) by mouth daily., Disp: 90 capsule, Rfl: 3   famotidine  (PEPCID ) 40 MG tablet, Take 40 mg by mouth daily., Disp: , Rfl:    fluconazole  (DIFLUCAN ) 150 MG tablet, Take one tablet x 1 and then may repeat x 1 in 3 days if persistent symptoms., Disp: 2 tablet, Rfl: 0   isosorbide  mononitrate (IMDUR ) 30 MG 24 hr tablet, Take 1 tablet (30 mg total) by mouth 2 (two) times daily., Disp: 30 tablet, Rfl: 0   methocarbamol (ROBAXIN) 750 MG tablet, Take 750 mg by mouth., Disp: , Rfl:    Multiple Vitamin (MULTIVITAMIN) tablet, Take 1 tablet by mouth daily., Disp: , Rfl:    Multiple Vitamins-Minerals (HAIR SKIN NAILS) CAPS, Take 2 capsules by mouth daily., Disp: , Rfl:    mupirocin  ointment (BACTROBAN ) 2 %, Apply 1 Application topically 2 (two) times daily., Disp: 22 g, Rfl: 0   nitroGLYCERIN  (NITROSTAT ) 0.4 MG SL tablet, PLACE 1 TABLET UNDER THE TONGUE EVERY 5 MINUTES AS NEEDED FOR CHEST PAIN, Disp: 25 tablet, Rfl: 0   nystatin  (MYCOSTATIN /NYSTOP ) powder, Apply topically 2 (two) times daily. to affected area(s), Disp: 30 g, Rfl: 0   pantoprazole  (PROTONIX ) 40 MG tablet, TAKE 1 TABLET TWICE DAILY BEFORE  MEALS (Patient taking differently: 2 (two) times daily. TAKE 1 TABLET TWICE DAILY BEFORE MEALS), Disp: 180 tablet, Rfl: 3   propranolol  ER (INDERAL  LA) 80 MG 24 hr capsule, Take 1 capsule (80 mg total) by mouth 2 (two) times daily., Disp: 60 capsule, Rfl: 6   rOPINIRole  (REQUIP ) 0.5 MG tablet, Take 1 tablet (0.5 mg total) by mouth daily after lunch. Take along with 3 mg daily( total of 3.5 mg daily ), Disp: 90 tablet, Rfl: 3   rOPINIRole  (REQUIP ) 3 MG tablet, TAKE ONE TABLET BY MOUTH AT BEDTIME. TAKE ALONG WITH 0.5MG  DAILY FOR A TOTAL OF 3.5MG  DAILY, Disp: 90 tablet, Rfl: 3   rosuvastatin  (CRESTOR ) 20 MG tablet, Take 1 tablet (20 mg  total) by mouth daily., Disp: 90 tablet, Rfl: 3   Semaglutide ,0.25 or 0.5MG /DOS, (OZEMPIC , 0.25 OR 0.5 MG/DOSE,) 2 MG/3ML SOPN, Inject 0.5 mg into the skin once a week. (Patient taking differently: Inject 0.25 mg into the skin once a week.), Disp: , Rfl:    telmisartan  (MICARDIS ) 80 MG tablet, TAKE 1 TABLET BY MOUTH DAILY., Disp: 90 tablet, Rfl: 3   traZODone  (DESYREL ) 100 MG tablet, TAKE 1-2 TABLETS BY MOUTH AT BEDTIME., Disp: 60 tablet, Rfl: 1   trimethoprim  (TRIMPEX ) 100 MG tablet, Take 1 tablet (100 mg total) by mouth daily., Disp: 90 tablet, Rfl: 0   trospium  (SANCTURA ) 20 MG tablet, Take 1 tablet (20 mg total) by mouth 2 (two) times daily., Disp: 60 tablet, Rfl: 11   Medications ordered in this encounter:  Meds ordered this encounter  Medications   predniSONE  (DELTASONE ) 20 MG tablet    Sig: Take 1 tablet (20 mg total) by mouth 2 (two) times daily with a meal for 5 days.    Dispense:  10 tablet    Refill:  0    Supervising Provider:   LAMPTEY, PHILIP O [[id number]]   triamcinolone  cream (KENALOG ) 0.1 %    Sig: Apply twice a day to the larger areas such as chest, lower back.  Avoid use over face and genital area    Dispense:  45 g    Refill:  0    Supervising Provider:   BLAISE ALEENE KIDD [[id number]]     *If you need refills on other medications prior to your next appointment, please contact your pharmacy*  Follow-Up: Call back or seek an in-person evaluation if the symptoms worsen or if the condition fails to improve as anticipated.  [facility] [phone]  Other Instructions  Please to the emergency room if any new or worsening symptoms  Please seek an in-person evaluation if the symptoms worsen or if the condition fails to improve as anticipated.  PCP follow-up in 5-7 days   If you have been instructed to have an in-person evaluation today at a local Urgent Care facility, please use the link below. It will take you to a list of all of our available [facility], including address, phone number and hours of operation. Please do not delay care.  [facility]  If you or a family member do not have a primary care provider, use the link below to schedule a visit and establish care. When you choose a [place] primary care physician or advanced practice provider, you gain a long-term partner in health. Find a Primary Care Provider  Learn more about [facility]'s in-office and virtual care options: [place] - Get Care Now

## 2024-09-30 NOTE — Telephone Encounter (Signed)
 PAP: Application for Breztri has been submitted to [employer] ([employer]), via fax

## 2024-09-30 NOTE — Progress Notes (Signed)
 Virtual Visit Consent   Kathryn Friedman, you are scheduled for a virtual visit with a [facility] provider today. Just as with appointments in the office, your consent must be obtained to participate. Your consent will be active for this visit and any virtual visit you may have with one of our providers in the next 365 days. If you have a MyChart account, a copy of this consent can be sent to you electronically.  As this is a virtual visit, video technology does not allow for your provider to perform a traditional examination. This may limit your provider's ability to fully assess your condition. If your provider identifies any concerns that need to be evaluated in person or the need to arrange testing (such as labs, EKG, etc.), we will make arrangements to do so. Although advances in technology are sophisticated, we cannot ensure that it will always work on either your end or our end. If the connection with a video visit is poor, the visit may have to be switched to a telephone visit. With either a video or telephone visit, we are not always able to ensure that we have a secure connection.  By engaging in this virtual visit, you consent to the provision of healthcare and authorize for your insurance to be billed (if applicable) for the services provided during this visit. Depending on your insurance coverage, you may receive a charge related to this service.  I need to obtain your verbal consent now. Are you willing to proceed with your visit today? Kathryn Friedman has provided verbal consent on 09/30/2024 for a virtual visit (video or telephone). Sheleen Conchas, PA-C  Date: 09/30/2024 11:44 AM   Virtual Visit via Video Note   I, Kathryn Friedman, connected with  Kathryn Friedman  ([id number], 28-Dec-1955) on 09/30/24 at 11:30 AM EST by a video-enabled telemedicine application and verified that I am speaking with the correct person using two identifiers.  Location: Patient: Virtual Visit Location  Patient: Home Provider: Virtual Visit Location Provider: Home Office   I discussed the limitations of evaluation and management by telemedicine and the availability of in person appointments. The patient expressed understanding and agreed to proceed.    History of Present Illness: Kathryn Friedman is a 68 y.o. who identifies as a female who was assigned female at birth, and is being seen today for rash.  HPI: Patient presents for evaluation of rash for the last day or so.  Patient reports red itchy rash on her neck on her lower body private area and hands.  She has been taking Benadryl  with any significant relief.  Patient denies any known contact exposure, suspects that her eyedrops may be causing her symptoms.  She was seen by the eye doctor last week, her initial eyedrops which contained Neosporin were changed to another medication.  She denies any current eye symptoms.  She denies any swelling of lips, tongue, chest pain, shortness of breath.  Patient reports that her dog has similar symptoms, and able to go outside.    Problems:  Patient Active Problem List   Diagnosis Date Noted   Cocaine  use disorder, moderate, dependence (HCC) 03/04/2024   Lymphedema 04/13/2023   Skin lesion of back 03/28/2023   Rash 03/17/2023   Cat scratch 03/17/2023   Swelling of lower extremity 03/16/2023   Thumb pain, right 09/01/2022   Neck pain 09/01/2022   Tobacco dependence 08/14/2022   Primary osteoarthritis of first carpometacarpal joint of right hand 07/10/2022   Neck  nodule 05/25/2022   Numbness of left hand 04/14/2022   Stented coronary artery 03/14/2022   Overactive bladder 03/14/2022   Obesity 03/14/2022   RLS (restless legs syndrome) 03/08/2022   Pre-op evaluation 03/06/2022   Closed wedge compression fracture of T4 vertebra (HCC) 02/08/2022   Bereavement 01/09/2022   Lumbar spondylosis 07/27/2021   Electrolyte disorder 07/01/2021   Closed nondisplaced fracture of greater trochanter of left  femur (HCC) 06/22/2021   Hip fx (HCC) 06/19/2021   Status post total hip replacement, left 06/12/2021   Paroxysmal A-fib (HCC) 06/07/2021   Primary osteoarthritis of left hip 05/18/2021   MDD (major depressive disorder), recurrent episode, moderate (HCC) 04/05/2021   Aortic atherosclerosis 04/03/2021   SOBOE (shortness of breath on exertion) 01/16/2021   MDD (major depressive disorder), recurrent episode, mild 11/16/2020   Skin lesion 07/23/2020   MDD (major depressive disorder), recurrent, in full remission 06/26/2020   Insomnia due to medical condition 06/26/2020   Anxiety disorder 06/26/2020   Cough 04/03/2020   Urge incontinence 03/02/2020   Urinary frequency 02/06/2020   Carotid artery disease 10/15/2019   PSVT (paroxysmal supraventricular tachycardia) 08/09/2019   History of migraine headaches 05/10/2019   Lower abdominal pain 03/21/2019   Dysphagia 09/15/2018   ILD (interstitial lung disease) (HCC) 08/17/2017   Tobacco use disorder 08/11/2016   Venous insufficiency of both lower extremities 07/10/2016   Healthcare maintenance 06/18/2016   Lump in the abdomen 08/06/2015   Dizziness 08/06/2015   Fatty infiltration of liver 08/11/2014   Blood in the stool 08/11/2014   Acute diarrhea 08/11/2014   Hemorrhage of gastrointestinal tract 08/07/2014   GERD (gastroesophageal reflux disease) 10/20/2013   Nausea with vomiting 10/19/2013   LUQ pain 10/19/2013   Diarrhea 09/26/2013   CAD (coronary artery disease) 02/18/2013   COPD (chronic obstructive pulmonary disease) (HCC) 02/18/2013   Obstructive sleep apnea 02/18/2013   Diverticulosis 02/18/2013   Diabetes (HCC) 02/17/2013   Essential hypertension, benign 02/17/2013   Hypercholesterolemia 02/17/2013    Allergies:  Allergies  Allergen Reactions   Jardiance [Empagliflozin] Other (See Comments)    Yeast infection   Metformin  And Related Diarrhea    even with XR   Medications:  Current Outpatient Medications:     predniSONE  (DELTASONE ) 20 MG tablet, Take 1 tablet (20 mg total) by mouth 2 (two) times daily with a meal for 5 days., Disp: 10 tablet, Rfl: 0   triamcinolone  cream (KENALOG ) 0.1 %, Apply twice a day to the larger areas such as chest, lower back.  Avoid use over face and genital area, Disp: 45 g, Rfl: 0   acetaminophen  (TYLENOL ) 500 MG tablet, Take 1,000 mg by mouth 2 (two) times daily., Disp: , Rfl:    albuterol  (VENTOLIN  HFA) 108 (90 Base) MCG/ACT inhaler, INHALE TWO PUFFS FOUR TIMES DAILY, Disp: 18 g, Rfl: 1   amLODipine  (NORVASC ) 5 MG tablet, TAKE 1 TABLET BY MOUTH ONCE DAILY, Disp: 90 tablet, Rfl: 3   apixaban  (ELIQUIS ) 5 MG TABS tablet, TAKE ONE (1) TABLET BY MOUTH TWO TIMES PER DAY, Disp: 60 tablet, Rfl: 3   Budeson-Glycopyrrol-Formoterol  (BREZTRI  AEROSPHERE) 160-9-4.8 MCG/ACT AERO, Inhale 2 puffs into the lungs in the morning and at bedtime., Disp: 32.1 g, Rfl: 3   CALCIUM  PO, Take 600 mg by mouth daily. , Disp: , Rfl:    cholecalciferol (VITAMIN D3) 25 MCG (1000 UNIT) tablet, Take 1,000 Units by mouth daily., Disp: , Rfl:    Continuous Blood Gluc Receiver DEVI, Use as directed to check  blood sugars daily, Disp: 1 each, Rfl: 0   Continuous Glucose Sensor (DEXCOM G7 SENSOR) MISC, Apply 1 sensor every 10 days., Disp: 3 each, Rfl: 11   Cyanocobalamin (B-12) 2500 MCG TABS, Take 2,500 mcg by mouth daily., Disp: , Rfl:    DULoxetine  (CYMBALTA ) 60 MG capsule, Take 1 capsule (60 mg total) by mouth daily., Disp: 90 capsule, Rfl: 3   famotidine  (PEPCID ) 40 MG tablet, Take 40 mg by mouth daily., Disp: , Rfl:    fluconazole  (DIFLUCAN ) 150 MG tablet, Take one tablet x 1 and then may repeat x 1 in 3 days if persistent symptoms., Disp: 2 tablet, Rfl: 0   isosorbide  mononitrate (IMDUR ) 30 MG 24 hr tablet, Take 1 tablet (30 mg total) by mouth 2 (two) times daily., Disp: 30 tablet, Rfl: 0   methocarbamol (ROBAXIN) 750 MG tablet, Take 750 mg by mouth., Disp: , Rfl:    Multiple Vitamin (MULTIVITAMIN) tablet,  Take 1 tablet by mouth daily., Disp: , Rfl:    Multiple Vitamins-Minerals (HAIR SKIN NAILS) CAPS, Take 2 capsules by mouth daily., Disp: , Rfl:    mupirocin  ointment (BACTROBAN ) 2 %, Apply 1 Application topically 2 (two) times daily., Disp: 22 g, Rfl: 0   nitroGLYCERIN  (NITROSTAT ) 0.4 MG SL tablet, PLACE 1 TABLET UNDER THE TONGUE EVERY 5 MINUTES AS NEEDED FOR CHEST PAIN, Disp: 25 tablet, Rfl: 0   nystatin  (MYCOSTATIN /NYSTOP ) powder, Apply topically 2 (two) times daily. to affected area(s), Disp: 30 g, Rfl: 0   pantoprazole  (PROTONIX ) 40 MG tablet, TAKE 1 TABLET TWICE DAILY BEFORE MEALS (Patient taking differently: 2 (two) times daily. TAKE 1 TABLET TWICE DAILY BEFORE MEALS), Disp: 180 tablet, Rfl: 3   propranolol  ER (INDERAL  LA) 80 MG 24 hr capsule, Take 1 capsule (80 mg total) by mouth 2 (two) times daily., Disp: 60 capsule, Rfl: 6   rOPINIRole  (REQUIP ) 0.5 MG tablet, Take 1 tablet (0.5 mg total) by mouth daily after lunch. Take along with 3 mg daily( total of 3.5 mg daily ), Disp: 90 tablet, Rfl: 3   rOPINIRole  (REQUIP ) 3 MG tablet, TAKE ONE TABLET BY MOUTH AT BEDTIME. TAKE ALONG WITH 0.5MG  DAILY FOR A TOTAL OF 3.5MG  DAILY, Disp: 90 tablet, Rfl: 3   rosuvastatin  (CRESTOR ) 20 MG tablet, Take 1 tablet (20 mg total) by mouth daily., Disp: 90 tablet, Rfl: 3   Semaglutide ,0.25 or 0.5MG /DOS, (OZEMPIC , 0.25 OR 0.5 MG/DOSE,) 2 MG/3ML SOPN, Inject 0.5 mg into the skin once a week. (Patient taking differently: Inject 0.25 mg into the skin once a week.), Disp: , Rfl:    telmisartan  (MICARDIS ) 80 MG tablet, TAKE 1 TABLET BY MOUTH DAILY., Disp: 90 tablet, Rfl: 3   traZODone  (DESYREL ) 100 MG tablet, TAKE 1-2 TABLETS BY MOUTH AT BEDTIME., Disp: 60 tablet, Rfl: 1   trimethoprim  (TRIMPEX ) 100 MG tablet, Take 1 tablet (100 mg total) by mouth daily., Disp: 90 tablet, Rfl: 0   trospium  (SANCTURA ) 20 MG tablet, Take 1 tablet (20 mg total) by mouth 2 (two) times daily., Disp: 60 tablet, Rfl:  11  Observations/Objective: Patient is well-developed, well-nourished in no acute distress.  Resting comfortably  at home.  Head is normocephalic, atraumatic.  No labored breathing.  Speech is clear and coherent with logical content.  Patient is alert and oriented at baseline.  Skin: Visible skin over video shows erythematous patches over the neck on the side and hands.  Limited examination due to quality of video.  No open wounds noted.  No vesicles noted.  Assessment and Plan: 1. Rash and nonspecific skin eruption (Primary) - predniSONE  (DELTASONE ) 20 MG tablet; Take 1 tablet (20 mg total) by mouth 2 (two) times daily with a meal for 5 days.  Dispense: 10 tablet; Refill: 0 - triamcinolone  cream (KENALOG ) 0.1 %; Apply twice a day to the larger areas such as chest, lower back.  Avoid use over face and genital area  Dispense: 45 g; Refill: 0  Trial of prednisone , patient reports she has taken in the past and has not had any acute side effects.  Reports her diabetes is currently well-controlled.  Her blood sugar this morning was 106. Topical hydrocortisone 2.5 mg for areas with larger patches of the rash, requested by patient. Continue Benadryl  at nighttime with caution. May take nondrowsy antihistamine such as Claritin or Zyrtec in the daytime. Patient advised to follow-up in the emergency room if any acute new or worsening symptoms.  If no relief with current treatment she is to follow-up with primary care in 2 days.   Follow Up Instructions: I discussed the assessment and treatment plan with the patient. The patient was provided an opportunity to ask questions and all were answered. The patient agreed with the plan and demonstrated an understanding of the instructions.  A copy of instructions were sent to the patient via MyChart unless otherwise noted below.     The patient was advised to call back or seek an in-person evaluation if the symptoms worsen or if the condition fails to  improve as anticipated.    Gavriel Holzhauer, PA-C

## 2024-10-01 ENCOUNTER — Telehealth: Payer: Self-pay | Admitting: Pharmacist

## 2024-10-01 NOTE — Progress Notes (Signed)
 Brief Telephone Documentation Reason for Call: Patient left message regarding phone call with [employer]  Summary of Call: Patient states she spoke w [employer] this morning and they are requiring proof of LIS denial.  In voicemail, patient reports she is overwhelmed as they told her her income did not qualify for LIS.   Patient notes she does have a copy of 2024 Taxes 1040 and she can send these to clinic to confirm income. Will plan to send income verification to [employer] as well if we still feel she qualifies. This may also allow us  to bypass needing an LIS denial.

## 2024-10-04 ENCOUNTER — Telehealth: Payer: Self-pay | Admitting: Pharmacist

## 2024-10-04 NOTE — Progress Notes (Signed)
 Brief Telephone Documentation Reason for Call: Patient left message regarding update on Patient Assistance   Summary of Call: Patient states [employer] called her this morning and informed her that she has been approved for the Xarelto program and will receive her first shipment tomorrow.   Reviewed with patient that she is not to take Eliquis  and Xarelto together. Xarelto will replace Eliquis  once she runs out.  Reviewed dosing differences including Xarelto is taken only once daily, preferably with largest meal of the day (evening meal) for optimal absorption. She verbalizes understanding.  May start Xarelto the evening after her last dose of Eliquis .   Most recent labs: Lab Results  Component Value Date   GFR 70.49 06/01/2024   AST 22 06/01/2024   ALT 24 06/01/2024   HGB 14.5 06/01/2024   HCT 43.3 06/01/2024   Indication: AF Xarelto standard dose (20 mg daily with evening meal) remains appropriate in the setting of sufficient renal function.   Confirmed prescription page on application previously sent is for 20 mg tablet every day.    Manuelita FABIENE Kobs, PharmD Clinical Pharmacist [facility] [phone]

## 2024-10-05 ENCOUNTER — Other Ambulatory Visit

## 2024-10-11 ENCOUNTER — Other Ambulatory Visit

## 2024-10-13 ENCOUNTER — Encounter

## 2024-10-18 ENCOUNTER — Ambulatory Visit: Admitting: Urology

## 2024-10-18 ENCOUNTER — Encounter: Payer: Self-pay | Admitting: Internal Medicine

## 2024-10-18 VITALS — BP 99/62 | HR 71 | Ht 64.0 in | Wt 187.0 lb

## 2024-10-18 DIAGNOSIS — N3941 Urge incontinence: Secondary | ICD-10-CM | POA: Diagnosis not present

## 2024-10-18 DIAGNOSIS — R3915 Urgency of urination: Secondary | ICD-10-CM | POA: Insufficient documentation

## 2024-10-18 LAB — URINALYSIS, COMPLETE
Bilirubin, UA: NEGATIVE
Glucose, UA: NEGATIVE
Ketones, UA: NEGATIVE
Leukocytes,UA: NEGATIVE
Nitrite, UA: NEGATIVE
Protein,UA: NEGATIVE
RBC, UA: NEGATIVE
Specific Gravity, UA: 1.03 (ref 1.005–1.030)
Urobilinogen, Ur: 0.2 mg/dL (ref 0.2–1.0)
pH, UA: 5.5 (ref 5.0–7.5)

## 2024-10-18 LAB — MICROSCOPIC EXAMINATION: Epithelial Cells (non renal): >10 /HPF — AB (ref 0–10)
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
## 2024-10-19 ENCOUNTER — Encounter: Payer: Self-pay | Admitting: Internal Medicine

## 2024-10-19 ENCOUNTER — Encounter: Payer: Self-pay | Admitting: *Deleted

## 2024-10-19 DIAGNOSIS — G8929 Other chronic pain: Secondary | ICD-10-CM | POA: Diagnosis not present

## 2024-10-19 DIAGNOSIS — D6869 Other thrombophilia: Secondary | ICD-10-CM | POA: Diagnosis not present

## 2024-10-21 LAB — CULTURE, URINE COMPREHENSIVE

## 2024-10-25 ENCOUNTER — Ambulatory Visit: Payer: Self-pay

## 2024-10-25 MED ORDER — NITROFURANTOIN MACROCRYSTAL 100 MG PO CAPS: 100.0000 mg | ORAL_CAPSULE | Freq: Two times a day (BID) | ORAL | 0 refills | Status: AC

## 2024-10-27 ENCOUNTER — Ambulatory Visit: Admitting: Psychiatry

## 2024-10-28 ENCOUNTER — Encounter: Payer: Self-pay | Admitting: Internal Medicine

## 2024-10-28 DIAGNOSIS — M5126 Other intervertebral disc displacement, lumbar region: Secondary | ICD-10-CM | POA: Diagnosis not present

## 2024-10-28 DIAGNOSIS — M5416 Radiculopathy, lumbar region: Secondary | ICD-10-CM | POA: Diagnosis not present

## 2024-10-28 DIAGNOSIS — M545 Low back pain, unspecified: Secondary | ICD-10-CM | POA: Insufficient documentation

## 2024-10-28 DIAGNOSIS — M48062 Spinal stenosis, lumbar region with neurogenic claudication: Secondary | ICD-10-CM | POA: Diagnosis not present

## 2024-11-02 ENCOUNTER — Other Ambulatory Visit: Payer: Self-pay | Admitting: Psychiatry

## 2024-11-02 DIAGNOSIS — G2581 Restless legs syndrome: Secondary | ICD-10-CM

## 2024-11-04 ENCOUNTER — Ambulatory Visit: Admitting: Psychiatry

## 2024-11-11 ENCOUNTER — Other Ambulatory Visit: Payer: Self-pay

## 2024-11-11 MED ORDER — TRIMETHOPRIM 100 MG PO TABS: 100.0000 mg | ORAL_TABLET | Freq: Every day | ORAL | 3 refills | Status: AC

## 2024-11-14 ENCOUNTER — Other Ambulatory Visit: Payer: Self-pay | Admitting: Cardiovascular Disease

## 2024-11-22 NOTE — Telephone Encounter (Signed)
 PAP: Patient assistance application for Breztri  has been approved by PAP Companies: [employer] from 11/25/2024 to 11/24/2025. Medication should be delivered to PAP Delivery: Home. For further shipping updates, please contact [employer] ([employer]) at [phone]. Patient ID is: [id number].

## 2024-12-02 ENCOUNTER — Other Ambulatory Visit: Payer: Self-pay

## 2024-12-02 ENCOUNTER — Telehealth: Payer: Self-pay | Admitting: Psychiatry

## 2024-12-02 ENCOUNTER — Telehealth: Payer: Self-pay

## 2024-12-02 ENCOUNTER — Telehealth (HOSPITAL_BASED_OUTPATIENT_CLINIC_OR_DEPARTMENT_OTHER): Payer: Self-pay

## 2024-12-02 DIAGNOSIS — N3941 Urge incontinence: Secondary | ICD-10-CM

## 2024-12-02 NOTE — Telephone Encounter (Signed)
 Per Dr. MacDiarmid,  Patient is to be scheduled for  Bilateral Peripheral Nerve Evaluation   Kathryn Friedman was contacted and possible surgical dates were discussed, Monday January 26th, 2026 was agreed upon for surgery.   Patient was directed to call [phone] between 1-3pm the day before surgery to find out surgical arrival time.  Instructions were given not to eat or drink from midnight on the night before surgery and have a driver for the day of surgery. On the surgery day patient was instructed to enter through the [facility] entrance of [facility] report the Same Day Surgery desk.   Pre-Admit Testing will be in contact via phone to set up an interview with the anesthesia team to review your history and medications prior to surgery.   Reminder of this information was sent via MyChart to the patient.

## 2024-12-02 NOTE — Telephone Encounter (Signed)
"  ° °  Pre-operative Risk Assessment    Patient Name: Kathryn Friedman  DOB: 10-18-1956 MRN: [id number]   Date of last office visit: 08/17/2024 with Lonni Meager, NP Date of next office visit: 02/25/2025 with Lonni Meager, NP  Request for Surgical Clearance    Procedure:  Bilateral Peripheral Nerve Evaluation  Date of Surgery:  Clearance 12/20/24                                 Surgeon:  Glendia Elizabeth, MD Surgeon's Group or Practice Name:  [facility]  Phone number:  [phone] Fax number:  [phone]   Type of Clearance Requested:   - Medical  - Pharmacy:  Hold Rivaroxaban (Xarelto) -does not specify (Eliquis  was also on current list as of today. Eliquis  was D/C 09/27/24 and switched to Xarelto but there was an issuegetting Xarelto approved. I called pt today and she confirmed is now on Xarelto so I removed Eliquis  from her list.)    Type of Anesthesia:  MAC   Additional requests/questions:  None  Signed, Patrcia Iverson CROME   12/02/2024, 4:01 PM   "

## 2024-12-02 NOTE — Progress Notes (Signed)
 Surgical Physician Order Form [facility]   * Scheduling expectation : Next Available  *Length of Case:   *Clearance needed: yes  *Anticoagulation Instructions: Hold all anticoagulants  *Aspirin  Instructions: Hold Aspirin   *Post-op visit Date/Instructions:  TBD  *Diagnosis: Urge Incontinence  *Procedure: Bilateral Peripheral Nerve Evaluation    Additional orders: N/A  -Admit type: OUTpatient  -Anesthesia: MAC  -VTE Prophylaxis Standing Order SCDs       Other:   -Standing Lab Orders Per Anesthesia    Lab other: None  -Standing Test orders EKG/Chest x-ray per Anesthesia       Test other:   - Medications:  None  -Other orders:  N/A

## 2024-12-02 NOTE — Progress Notes (Signed)
" °  Phone Number: [phone] for Surgical Coordinator Fax Number: [phone]  REQUEST FOR SURGICAL CLEARANCE      Date: Date: 12/02/2024  [facility] to[facility]  Surgeon: Dr. Glendia Elizabeth, MD     Date of Surgery: 12/20/2024  Operation:  Bilateral Peripheral Nerve Evaluation   Anesthesia Type: MAC   Diagnosis: Urge Incontinence  Patient Requires:   Cardiac / Vascular Clearance : Yes  Reason: Would like for patient to hold her Xarelto prior to surgery  Risk Assessment:    Low   []       Moderate   []     High   []           This patient is optimized for surgery  YES []       NO   []    I recommend further assessment/workup prior to surgery. YES []      NO  []   Appointment scheduled for: _______________________   Further recommendations: ____________________________________     Physician Signature:__________________________________   Printed Name: ________________________________________   Date: _________________    "

## 2024-12-02 NOTE — Progress Notes (Signed)
" ° °  [facility] Surgical Posting Form  Surgery Date: Date: 12/20/2024  Surgeon: Dr. Glendia Elizabeth, MD  Inpt ( No  )   Outpt (Yes)   Obs ( No  )   Diagnosis: N39.41 Urge Incontinence  -CPT: 35438-49  Surgery: Bilateral Peripheral Nerve Evaluation   Stop Anticoagulations: Yes  Cardiac/Medical/Pulmonary Clearance needed: Yes  Clearance needed from Dr: [facility]  Clearance request sent on: Date: 12/02/2024  *Orders entered into EPIC  Date: 12/02/2024   *Case booked in [place]  Date: 12/02/2024  *Notified pt of Surgery: Date: 12/02/2024  PRE-OP UA & CX: No  *Placed into Prior Authorization Work [place] Date: 12/02/2024  Assistant/laser/rep:Yes, [employer] Rep to be present                 "

## 2024-12-03 ENCOUNTER — Telehealth (HOSPITAL_BASED_OUTPATIENT_CLINIC_OR_DEPARTMENT_OTHER): Payer: Self-pay | Admitting: *Deleted

## 2024-12-03 NOTE — Telephone Encounter (Signed)
 Pt has been scheduled tele preop appt 12/08/24. Med rec and consent are done.

## 2024-12-03 NOTE — Telephone Encounter (Signed)
 Patient with diagnosis of afib on Xarelto for anticoagulation.    Procedure:  Bilateral Peripheral Nerve Evaluation  Date of procedure: 12/20/24   CHA2DS2-VASc Score = 4   This indicates a 4.8% annual risk of stroke. The patient's score is based upon: CHF History: 0 HTN History: 1 Diabetes History: 0 Stroke History: 0 Vascular Disease History: 1 Age Score: 1 Gender Score: 1      CrCl 66.7 Platelet count 124  Patient has not had an Afib/aflutter ablation in the last 3 months, DCCV within the last 4 weeks or a watchman implanted in the last 45 days   Per office protocol, patient can hold Xarelto for 2 days prior to procedure.    **This guidance is not considered finalized until pre-operative APP has relayed final recommendations.**

## 2024-12-03 NOTE — Telephone Encounter (Signed)
" ° °  Name: Kathryn Friedman  DOB: 01-29-1956  MRN: [id number]  Primary Cardiologist: Evalene Lunger, MD   Preoperative team, please contact this patient and set up a phone call appointment for further preoperative risk assessment. Please obtain consent and complete medication review. Thank you for your help.  I confirm that guidance regarding antiplatelet and oral anticoagulation therapy has been completed and, if necessary, noted below.  Hold Xarelto for 2 days prior to the procedure  I also confirmed the patient resides in the state of  . As per [facility] Medical Board telemedicine laws, the patient must reside in the state in which the provider is licensed.   Kathryn Friedman, [place] 12/03/2024, 12:12 PM [facility]    "

## 2024-12-03 NOTE — Telephone Encounter (Signed)
 Pt has been scheduled tele preop appt 12/08/24. Med rec and consent are done.      Patient Consent for Virtual Visit        Kathryn Friedman has provided verbal consent on 12/03/2024 for a virtual visit (video or telephone).   CONSENT FOR VIRTUAL VISIT FOR:  Kathryn Friedman  By participating in this virtual visit I agree to the following:  I hereby voluntarily request, consent and authorize [facility] and its employed or contracted physicians, physician assistants, nurse practitioners or other licensed health care professionals (the Practitioner), to provide me with telemedicine health care services (the Services) as deemed necessary by the treating Practitioner. I acknowledge and consent to receive the Services by the Practitioner via telemedicine. I understand that the telemedicine visit will involve communicating with the Practitioner through live audiovisual communication technology and the disclosure of certain medical information by electronic transmission. I acknowledge that I have been given the opportunity to request an in-person assessment or other available alternative prior to the telemedicine visit and am voluntarily participating in the telemedicine visit.  I understand that I have the right to withhold or withdraw my consent to the use of telemedicine in the course of my care at any time, without affecting my right to future care or treatment, and that the Practitioner or I may terminate the telemedicine visit at any time. I understand that I have the right to inspect all information obtained and/or recorded in the course of the telemedicine visit and may receive copies of available information for a reasonable fee.  I understand that some of the potential risks of receiving the Services via telemedicine include:  Delay or interruption in medical evaluation due to technological equipment failure or disruption; Information transmitted may not be sufficient (e.g. poor  resolution of images) to allow for appropriate medical decision making by the Practitioner; and/or  In rare instances, security protocols could fail, causing a breach of personal health information.  Furthermore, I acknowledge that it is my responsibility to provide information about my medical history, conditions and care that is complete and accurate to the best of my ability. I acknowledge that Practitioner's advice, recommendations, and/or decision may be based on factors not within their control, such as incomplete or inaccurate data provided by me or distortions of diagnostic images or specimens that may result from electronic transmissions. I understand that the practice of medicine is not an exact science and that Practitioner makes no warranties or guarantees regarding treatment outcomes. I acknowledge that a copy of this consent can be made available to me via my patient portal [facility] MyChart), or I can request a printed copy by calling the office of  [facility].    I understand that my insurance will be billed for this visit.   I have read or had this consent read to me. I understand the contents of this consent, which adequately explains the benefits and risks of the Services being provided via telemedicine.  I have been provided ample opportunity to ask questions regarding this consent and the Services and have had my questions answered to my satisfaction. I give my informed consent for the services to be provided through the use of telemedicine in my medical care

## 2024-12-03 NOTE — Telephone Encounter (Signed)
 Yes for this visit. Please let her know we will need a urine drug screen prior to the visit. Please let patient know to go to [facility] lab and get it done in the next few days

## 2024-12-03 NOTE — Telephone Encounter (Signed)
 Ok to [employer] , will discuss at her visit

## 2024-12-06 ENCOUNTER — Other Ambulatory Visit: Payer: Self-pay | Admitting: Psychiatry

## 2024-12-06 DIAGNOSIS — F3342 Major depressive disorder, recurrent, in full remission: Secondary | ICD-10-CM

## 2024-12-07 NOTE — Progress Notes (Unsigned)
 "   Virtual Visit via Telephone Note   Because of Kathryn Friedman co-morbid illnesses, she is at least at moderate risk for complications without adequate follow up.  This format is felt to be most appropriate for this patient at this time.  Due to technical limitations with video connection (technology), today's appointment will be conducted as an audio only telehealth visit, and Kathryn Friedman verbally agreed to proceed in this manner.   All issues noted in this document were discussed and addressed.  No physical exam could be performed with this format.  Evaluation Performed:  Preoperative cardiovascular risk assessment _____________   Date:  12/08/2024   Patient ID:  Kathryn Friedman, DOB Dec 17, 1955, MRN [id number] Patient Location:  Home Provider location:   Office  Primary Care Provider:  Glendia Shad, MD Primary Cardiologist:  Evalene Lunger, MD  Chief Complaint / Patient Profile   69 y.o. y/o female with a h/o CAD with stent to RCA in 2007, catheterization in 2019 revealing occlusive disease of distal RCA with left-to-right collaterals with recommendation for medical therapy, HTN, HLD, PSVT, diabetes, sleep apnea on CPAP, tobacco abuse, venous insufficiency with lymphadema, COPD who is pending bilateral peripheral nerve evaluation and presents today for telephonic preoperative cardiovascular risk assessment.  History of Present Illness    Kathryn Friedman is a 69 y.o. female who presents via audio/video conferencing for a telehealth visit today.  Pt was last seen in cardiology clinic on 08/17/24 by Medford Meager, NP.  At that time Kathryn Friedman was doing well.  The patient is now pending procedure as outlined above. Since her last visit, she denies chest pain, lower extremity edema, fatigue, palpitations, melena, hematuria, hemoptysis, diaphoresis, weakness, presyncope, syncope, orthopnea, and PND. She reports chronic shortness of breath which she feels is stable. She is able to  achieve > 4 METS activity without concerning cardiac symptoms.    Past Medical History    Past Medical History:  Diagnosis Date   Anemia    Anginal pain    Anxiety    Arthritis    back and knees   Asthma    Bilateral carotid artery stenosis    Blood in stool    Carpal tunnel syndrome, right    Cervical radiculopathy    Chronic diarrhea    COPD (chronic obstructive pulmonary disease) (HCC)    Coronary artery disease    a. 75% pRCA; 3.5 x 28 mm Cypher DES placed on 07/24/2006; b. 04/2018 Cath: LM nl, LAD 30ost/p, LCX 30p/m, 56m/d, RCA 55p, 40p/m ISR, 70/15m, 100d, RPDA 70ost/p, RPDA/RPAV- fill via L->R collats->Med Rx; c. 04/2021 MV: No ischemia/infarct.   Current use of long term anticoagulation    Clopidogrel    Depression    secondary to the death of her husband (died 53)   Diverticulitis    Diverticulosis    Dizzinesses    Dysphagia    Fatty infiltration of liver    GERD (gastroesophageal reflux disease)    Headache    History of 2019 novel coronavirus disease (COVID-19) 12/09/2020   History of 2019 novel coronavirus disease (COVID-19) 12/20/2020   History of echocardiogram    a. 02/2023 Echo: EF 60-65%, no rwma, nl RV fxn, RVSP 35.4mmHg. Mild MR.   Hypertension    Hypertriglyceridemia    ILD (interstitial lung disease) (HCC)    Lump in the abdomen    OSA on CPAP    Overactive bladder    Paroxysmal atrial fibrillation (HCC)  a. CHA2DS2VASc = 5-->eliquis ; b. 01/2022 Zio: 151 SVT runs. 3% Afib burden.   PSVT (paroxysmal supraventricular tachycardia)    a. 01/2022 Zio: 151 SVT runs - longest 21.4 secs.   Spastic colon    T2DM (type 2 diabetes mellitus) (HCC) 05/2008   Thrombocytopenia    Tobacco abuse    Venous insufficiency of both lower extremities    Past Surgical History:  Procedure Laterality Date   ABDOMINAL HYSTERECTOMY  with left ovary in place 1996   APPENDECTOMY  1985   gallbladder and Appendix   BREAST BIOPSY Left 10/14/2017   calcs bx, fibrosis giant  cell reaction and chronic inflammation, negative for malignancy.    CARPOMETACARPAL (CMC) FUSION OF THUMB Right 07/10/2022   Procedure: CARPOMETACARPAL (CMC) SUSPENSION OF RIGHT THUMB;  Surgeon: Edie Norleen PARAS, MD;  Location: [facility] ORS;  Service: Orthopedics;  Laterality: Right;   CATARACT EXTRACTION, BILATERAL     CESAREAN SECTION  1984   CHOLECYSTECTOMY  1985   COLONOSCOPY WITH PROPOFOL  N/A 09/13/2016   Procedure: COLONOSCOPY WITH PROPOFOL ;  Surgeon: Lamar ONEIDA Holmes, MD;  Location: [facility] ENDOSCOPY;  Service: Endoscopy;  Laterality: N/A;   COLONOSCOPY WITH PROPOFOL  N/A 11/09/2018   Procedure: COLONOSCOPY WITH PROPOFOL ;  Surgeon: Holmes Lamar ONEIDA, MD;  Location[facility] ENDOSCOPY;  Service: Endoscopy;  Laterality: N/A;   COLONOSCOPY WITH PROPOFOL  N/A 03/29/2020   Procedure: COLONOSCOPY WITH PROPOFOL ;  Surgeon: Dessa Reyes ORN, MD;  Location: [facility] ENDOSCOPY;  Service: Endoscopy;  Laterality: N/A;   CORONARY ANGIOPLASTY WITH STENT PLACEMENT N/A 07/24/2006   75% pRCA; 3.5 x 28 mm Cypher DES placed; Location: [facility]; Surgeons: Margie Lovelace, MD   ESOPHAGOGASTRODUODENOSCOPY (EGD) WITH PROPOFOL  N/A 02/02/2018   Procedure: ESOPHAGOGASTRODUODENOSCOPY (EGD) WITH PROPOFOL ;  Surgeon: Holmes Lamar ONEIDA, MD;  Location: [facility] ENDOSCOPY;  Service: Endoscopy;  Laterality: N/A;   ESOPHAGOGASTRODUODENOSCOPY (EGD) WITH PROPOFOL  N/A 03/29/2020   Procedure: ESOPHAGOGASTRODUODENOSCOPY (EGD) WITH PROPOFOL ;  Surgeon: Dessa Reyes ORN, MD;  Location: [facility] ENDOSCOPY;  Service: Endoscopy;  Laterality: N/A;   EYE SURGERY     JOINT REPLACEMENT     bilateral knee replacements   KNEE ARTHROSCOPY  Arthroscopic left knee surgery    KNEE SURGERY  status post knee surgey    LEFT HEART CATH AND CORONARY ANGIOGRAPHY Left 05/14/2018   Procedure: LEFT HEART CATH AND CORONARY ANGIOGRAPHY;  Surgeon: Hester Wolm PARAS, MD;  Location: [facility] INVASIVE CV LAB;  Service: Cardiovascular;  Laterality: Left;   REPLACEMENT TOTAL KNEE  ([facility])    SHOULDER SURGERY  shoulder operation secondary to a torn tendon   TOTAL HIP ARTHROPLASTY Left 06/12/2021   Procedure: TOTAL HIP ARTHROPLASTY;  Surgeon: Edie Norleen PARAS, MD;  Location: [facility] ORS;  Service: Orthopedics;  Laterality: Left;    Allergies  Allergies[1]  Home Medications    Prior to Admission medications  Medication Sig Start Date End Date Taking? Authorizing Provider  acetaminophen  (TYLENOL ) 500 MG tablet Take 1,000 mg by mouth 2 (two) times daily. 2 tabs in the morning, 1 tab at 5pm, and 1 tab at bedtime    [provider]  albuterol  (VENTOLIN  HFA) 108 (90 Base) MCG/ACT inhaler INHALE TWO PUFFS FOUR TIMES DAILY 03/25/24   Glendia Shad, MD  amLODipine  (NORVASC ) 5 MG tablet TAKE 1 TABLET BY MOUTH ONCE DAILY 01/23/24   Glendia Shad, MD  baclofen (LIORESAL) 10 MG tablet Take 10 mg by mouth 3 (three) times daily.    [provider]  Budeson-Glycopyrrol-Formoterol  (BREZTRI  AEROSPHERE) 160-9-4.8 MCG/ACT AERO Inhale 2 puffs into the  lungs in the morning and at bedtime. 12/06/21   Glendia Shad, MD  Calcium  Carb-Cholecalciferol (CALCIUM  + VITAMIN D3 PO) Take 2 tablets by mouth daily.    [provider]  Continuous Blood Gluc Receiver DEVI Use as directed to check blood sugars daily 12/31/22   Glendia Shad, MD  Continuous Glucose Sensor (DEXCOM G7 SENSOR) MISC Apply 1 sensor every 10 days. 12/31/23   Glendia Shad, MD  Cyanocobalamin (B-12) 1000 MCG TABS Take 1,000 mcg by mouth daily.    [provider]  DULoxetine  (CYMBALTA ) 60 MG capsule TAKE 1 CAPSULE BY MOUTH ONCE DAILY 12/06/24   Eappen, Saramma, MD  famotidine  (PEPCID ) 40 MG tablet Take 40 mg by mouth daily. 05/12/24   [provider]  isosorbide  mononitrate (IMDUR ) 30 MG 24 hr tablet TAKE 1 TABLET BY MOUTH TWICE DAILY 11/15/24   Gollan, Timothy J, MD  L-LYSINE PO Take 1,000 mg by mouth daily.    [provider]  loperamide  (IMODIUM ) 2 MG capsule Take 6 mg by mouth daily as  needed for diarrhea or loose stools.    [provider]  Multiple Vitamin (MULTIVITAMIN) tablet Take 1 tablet by mouth daily.    [provider]  Multiple Vitamins-Minerals (HAIR SKIN NAILS) CAPS Take 5,000 mcg by mouth daily.    [provider]  mupirocin  ointment (BACTROBAN ) 2 % Apply 1 Application topically 2 (two) times daily. Patient taking differently: Apply 1 Application topically daily as needed. 03/13/23   Glendia Shad, MD  nitroGLYCERIN  (NITROSTAT ) 0.4 MG SL tablet PLACE 1 TABLET UNDER THE TONGUE EVERY 5 MINUTES AS NEEDED FOR CHEST PAIN Patient not taking: Reported on 12/07/2024 01/23/24   Gollan, Timothy J, MD  nystatin  (MYCOSTATIN /NYSTOP ) powder Apply topically 2 (two) times daily. to affected area(s) Patient not taking: Reported on 12/07/2024 03/30/24   Glendia Shad, MD  pantoprazole  (PROTONIX ) 40 MG tablet TAKE 1 TABLET TWICE DAILY BEFORE MEALS Patient taking differently: Take 40 mg by mouth 2 (two) times daily. 12/31/23   Glendia Shad, MD  propranolol  ER (INDERAL  LA) 80 MG 24 hr capsule Take 1 capsule (80 mg total) by mouth 2 (two) times daily. 08/17/24   Vivienne Lonni Ingle, NP  rivaroxaban (XARELTO) 20 MG TABS tablet Take 20 mg by mouth daily with supper. Free through [employer] Patient assistance    [provider]  rOPINIRole  (REQUIP ) 0.5 MG tablet TAKE 1 TABLET BY MOUTH DAILY AFTER LUNCHALONG WITH THE 3MG  TABLET FOR A TOTAL DAILY DOSE OF 3.5MG  Patient taking differently: Take 0.5 mg by mouth at bedtime. 11/03/24   Eappen, Saramma, MD  rOPINIRole  (REQUIP ) 3 MG tablet TAKE ONE TABLET BY MOUTH AT BEDTIME. TAKE ALONG WITH 0.5MG  DAILY FOR A TOTAL OF 3.5MG  DAILY 03/15/24   Okey Barnie SAUNDERS, MD  rosuvastatin  (CRESTOR ) 20 MG tablet Take 1 tablet (20 mg total) by mouth daily. 12/31/23   Glendia Shad, MD  Semaglutide ,0.25 or 0.5MG /DOS, (OZEMPIC , 0.25 OR 0.5 MG/DOSE,) 2 MG/3ML SOPN Inject 0.5 mg into the skin once a week. Patient taking differently: Inject  0.5 mg into the skin once a week. Sundays 10/31/23   Glendia Shad, MD  telmisartan  (MICARDIS ) 80 MG tablet TAKE 1 TABLET BY MOUTH DAILY. 09/07/24   Glendia Shad, MD  traZODone  (DESYREL ) 100 MG tablet TAKE 1-2 TABLETS BY MOUTH AT BEDTIME. 09/14/24   Eappen, Saramma, MD  triamcinolone  cream (KENALOG ) 0.1 % Apply twice a day to the larger areas such as chest, lower back.  Avoid use over face and genital  area Patient not taking: Reported on 12/07/2024 09/30/24   Zohra, Mobeen, PA-C  trimethoprim  (TRIMPEX ) 100 MG tablet Take 1 tablet (100 mg total) by mouth daily. 11/11/24   Gaston Hamilton, MD  trospium  (SANCTURA ) 20 MG tablet Take 1 tablet (20 mg total) by mouth 2 (two) times daily. 05/14/24   Maurine Lukes, PA-C    Physical Exam    Vital Signs:  Kathryn Friedman does not have vital signs available for review today.  Given telephonic nature of communication, physical exam is limited. AAOx3. NAD. Normal affect.  Speech and respirations are unlabored.  Accessory Clinical Findings    None  Assessment & Plan    1.  Preoperative Cardiovascular Risk Assessment: According to the Revised Cardiac Risk Index (RCRI), her Perioperative Risk of Major Cardiac Event is (%): 0.4. Her Functional Capacity in METs is: 5.07 according to the Duke Activity Status Index (DASI). The patient is doing well from a cardiac perspective. Therefore, based on ACC/AHA guidelines, the patient would be at acceptable risk for the planned procedure without further cardiovascular testing.   The patient was advised that if she develops new symptoms prior to surgery to contact our office to arrange for a follow-up visit, and she verbalized understanding.  Per office protocol, she may hold Xarelto for 2 days prior to procedure and should resume as soon as hemodynamically stable postoperatively.  A copy of this note will be routed to requesting surgeon.  Time:   Today, I have spent 10 minutes with the patient with  telehealth technology discussing medical history, symptoms, and management plan.     Kathryn EMERSON Bane, NP-C  12/08/2024, 10:26 AM [address], [address] [place], [place] [address] Office [phone] Fax [phone]      [1]  Allergies Allergen Reactions   Erythromycin Hives and Itching   Tobramycin Hives and Itching   Jardiance [Empagliflozin] Other (See Comments)    Yeast infection   Metformin  And Related Diarrhea    even with XR   "

## 2024-12-08 ENCOUNTER — Ambulatory Visit: Attending: Cardiology | Admitting: Nurse Practitioner

## 2024-12-08 ENCOUNTER — Encounter: Payer: Self-pay | Admitting: Nurse Practitioner

## 2024-12-08 DIAGNOSIS — Z01810 Encounter for preprocedural cardiovascular examination: Secondary | ICD-10-CM | POA: Diagnosis not present

## 2024-12-13 ENCOUNTER — Other Ambulatory Visit: Payer: Self-pay

## 2024-12-13 ENCOUNTER — Encounter
Admission: RE | Admit: 2024-12-13 | Discharge: 2024-12-13 | Disposition: A | Discharge status: Home / Self Care | Source: Ambulatory Visit | Attending: Urology | Admitting: Urology

## 2024-12-13 VITALS — Ht 64.0 in | Wt 184.0 lb

## 2024-12-13 DIAGNOSIS — I779 Disorder of arteries and arterioles, unspecified: Secondary | ICD-10-CM

## 2024-12-13 DIAGNOSIS — E878 Other disorders of electrolyte and fluid balance, not elsewhere classified: Secondary | ICD-10-CM

## 2024-12-13 DIAGNOSIS — I10 Essential (primary) hypertension: Secondary | ICD-10-CM

## 2024-12-13 DIAGNOSIS — R42 Dizziness and giddiness: Secondary | ICD-10-CM

## 2024-12-13 DIAGNOSIS — E1159 Type 2 diabetes mellitus with other circulatory complications: Secondary | ICD-10-CM

## 2024-12-13 DIAGNOSIS — I471 Supraventricular tachycardia, unspecified: Secondary | ICD-10-CM

## 2024-12-13 DIAGNOSIS — I2510 Atherosclerotic heart disease of native coronary artery without angina pectoris: Secondary | ICD-10-CM

## 2024-12-13 NOTE — Patient Instructions (Addendum)
 Your procedure is scheduled on: Monday 12/20/24 To find out your arrival time, please call [phone] between 1PM - 3PM on: Friday Report to the Registration Desk on the 1st floor of the [facility]. If your arrival time is 6:00 am, do not arrive before that time as the [facility] entrance doors do not open until 6:00 am.  REMEMBER: Instructions that are not followed completely may result in serious medical risk, up to and including death; or upon the discretion of your surgeon and anesthesiologist your surgery may need to be rescheduled.  Do not eat food or drink any liquids after midnight the night before surgery.  No gum chewing or hard candies.  One week prior to surgery: Stop Anti-inflammatories (NSAIDS) such as Advil, Aleve , Ibuprofen, Motrin, Naproxen , Naprosyn  and Aspirin  based products such as Excedrin, Goody's Powder, BC Powder.  You may however, continue to take Tylenol  if needed for pain up until the day of surgery.  Stop ANY OVER THE COUNTER supplements and vitamins for at least 7 days until after surgery.  **Follow guidelines for insulin  and diabetes medications.** Hold Sunday dose of Ozempic  can restart after surgery.  **Follow recommendations regarding stopping blood thinners.** Hold Xarelto for 2 days, last dose Friday 12/17/24  Continue taking all of your other prescription medications up until the day of surgery.  ON THE DAY OF SURGERY ONLY TAKE THESE MEDICATIONS WITH SIPS OF WATER:  amLODipine  (NORVASC ) 5 MG tablet  baclofen (LIORESAL) 10 MG tablet  DULoxetine  (CYMBALTA ) 60 MG capsule  famotidine  (PEPCID ) 40 MG tablet  isosorbide  mononitrate (IMDUR ) 30 MG 24 hr tablet  pantoprazole  (PROTONIX ) 40 MG tablet  propranolol  ER (INDERAL  LA) 80 MG 24 hr capsule  trimethoprim  (TRIMPEX ) 100 MG tablet  trospium  (SANCTURA ) 20 MG tablet   Use inhalers on the day of surgery and bring ALBUTEROL  to the hospital.  No Alcohol for 24 hours before or after surgery.  No  Smoking including e-cigarettes for 24 hours before surgery.  No chewable tobacco products for at least 6 hours before surgery.  No nicotine  patches on the day of surgery.  Do not use any recreational drugs for at least a week (preferably 2 weeks) before your surgery.  Please be advised that the combination of cocaine  and anesthesia may have negative outcomes, up to and including death. If you test positive for cocaine , your surgery will be cancelled.  On the morning of surgery brush your teeth with toothpaste and water, you may rinse your mouth with mouthwash if you wish. Do not swallow any toothpaste or mouthwash.  Use CHG Soap or wipes as directed on instruction sheet. (You can pick this up at our office in the [facility], the building to the left of the [employer], [address] at [address]  Do not shave body hair from the neck down 48 hours before surgery.  Do not wear lotions, powders, or perfumes.   Wear comfortable clothing (specific to your surgery type) to the hospital.  Do not wear jewelry, make-up, hairpins, clips or nail polish.  For welded (permanent) jewelry: bracelets, anklets, waist bands, etc.  Please have this removed prior to surgery.  If it is not removed, there is a chance that hospital personnel will need to cut it off on the day of surgery.  Contact lenses, hearing aids and dentures may not be worn into surgery.  Do not bring valuables to the hospital. [facility] is not responsible for any missing/lost belongings  or valuables.   Bring your C-PAP to the hospital in case you may have to spend the night.   Notify your doctor if there is any change in your medical condition (cold, fever, infection).  After surgery, you can help prevent lung complications by doing breathing exercises.  Take deep breaths and cough every 1-2 hours. Your doctor may order a device called an Incentive Spirometer to help you take deep breaths.  When  coughing or sneezing, hold a pillow firmly against your incision with both hands. This is called splinting. Doing this helps protect your incision. It also decreases belly discomfort.  If you are being discharged the day of surgery, you will not be allowed to drive home. You will need a responsible individual to drive you home and stay with you for 24 hours after surgery.   Please call the Pre-admissions Testing Dept. at [phone] if you have any questions about these instructions.  Surgery Visitation Policy:  Patients having surgery or a procedure may have two visitors.  Children under the age of 65 must have an adult with them who is not the patient.  Merch[employer], Retail to address health-related social needs:  [email or web address]                                                                                                             Preparing for Surgery with CHLORHEXIDINE  GLUCONATE (CHG) Soap  Chlorhexidine  Gluconate (CHG) Soap  o An antiseptic cleaner that kills germs and bonds with the skin to continue killing germs even after washing  o Used for showering the night before surgery and morning of surgery  Before surgery, you can play an important role by reducing the number of germs on your skin.  CHG (Chlorhexidine  gluconate) soap is an antiseptic cleanser which kills germs and bonds with the skin to continue killing germs even after washing.  Please do not use if you have an allergy to CHG or antibacterial soaps. If your skin becomes reddened/irritated stop using the CHG.  1. Shower the NIGHT BEFORE SURGERY with CHG soap.  2. If you choose to wash your hair, wash your hair first as usual with your normal shampoo.  3. After shampooing, rinse your hair and body thoroughly to remove the shampoo.  4. Use CHG as you would any other liquid soap. You can apply CHG directly to the skin and wash gently with a clean washcloth.  5. Apply the CHG soap  to your body only from the neck down. Do not use on open wounds or open sores. Avoid contact with your eyes, ears, mouth, and genitals (private parts). Wash face and genitals (private parts) with your normal soap.  6. Wash thoroughly, paying special attention to the area where your surgery will be performed.  7. Thoroughly rinse your body with warm water.  8. Do not shower/wash with your normal soap after using and rinsing off the CHG soap.  9. Do not use lotions, oils, etc., after showering with CHG.  10. Pat yourself dry with a  clean towel.  11. Wear clean pajamas to bed the night before surgery.  12. Place clean sheets on your bed the night of your shower and do not sleep with pets.  13. Do not apply any deodorants/lotions/powders.  14. Please wear clean clothes to the hospital.  15. Remember to brush your teeth with your regular toothpaste.

## 2024-12-14 ENCOUNTER — Encounter: Payer: Self-pay | Admitting: Urgent Care

## 2024-12-14 ENCOUNTER — Other Ambulatory Visit

## 2024-12-15 ENCOUNTER — Inpatient Hospital Stay: Admission: RE | Admit: 2024-12-15

## 2024-12-21 ENCOUNTER — Telehealth: Admitting: Psychiatry

## 2024-12-21 ENCOUNTER — Encounter: Payer: Self-pay | Admitting: Psychiatry

## 2024-12-21 DIAGNOSIS — F3342 Major depressive disorder, recurrent, in full remission: Secondary | ICD-10-CM

## 2024-12-21 DIAGNOSIS — F1420 Cocaine dependence, uncomplicated: Secondary | ICD-10-CM | POA: Diagnosis not present

## 2024-12-21 DIAGNOSIS — F17200 Nicotine dependence, unspecified, uncomplicated: Secondary | ICD-10-CM | POA: Diagnosis not present

## 2024-12-21 DIAGNOSIS — F418 Other specified anxiety disorders: Secondary | ICD-10-CM | POA: Diagnosis not present

## 2024-12-21 DIAGNOSIS — G4701 Insomnia due to medical condition: Secondary | ICD-10-CM | POA: Diagnosis not present

## 2024-12-21 MED ORDER — TRAZODONE HCL 100 MG PO TABS: 100.0000 mg | ORAL_TABLET | Freq: Every day | ORAL | 1 refills | Status: AC

## 2024-12-21 NOTE — Patient Instructions (Addendum)
 [facility]      [email or web address] 2/5 (5 reviews)  Health & medical in [place], [place] [address], [place], [place] [address]   [facility] [email or web address] Counseling & mental health in [place], [place]  [address] [place], [place] [address] Open 24 hours

## 2024-12-21 NOTE — Progress Notes (Unsigned)
 Virtual Visit via Video Note  I connected with Kathryn Friedman on 12/21/24 at 10:30 AM EST by a video enabled telemedicine application and verified that I am speaking with the correct person using two identifiers.  Location Provider Location : [facility] Patient Location : Home  Participants: Patient , Provider   I discussed the limitations of evaluation and management by telemedicine and the availability of in person appointments. The patient expressed understanding and agreed to proceed.    I discussed the assessment and treatment plan with the patient. The patient was provided an opportunity to ask questions and all were answered. The patient agreed with the plan and demonstrated an understanding of the instructions.   The patient was advised to call back or seek an in-person evaluation if the symptoms worsen or if the condition fails to improve as anticipated.   BH MD OP Progress Note  12/21/2024 1:18 PM Kathryn Friedman  MRN:  [id number]  Chief Complaint:  Chief Complaint  Patient presents with   Follow-up   Depression   Drug Problem   Medication Refill   HPI: Kathryn Friedman is a 69 year old Caucasian female, widowed, retired, on [employer], lives in [place], has a history of MDD, insomnia, obstructive sleep apnea on CPAP, history of coronary artery disease status post stent placement, COPD, diabetes mellitus, hypertension, GERD, interstitial lung disease, iron deficiency, cocaine  abuse was evaluated by telemedicine today for a follow-up appointment.  At her last visit on 08/13/2024 patient had ongoing mood symptoms including anxiety depression as well as had comorbid cocaine  use and was referred to [facility] substance abuse program.  Patient briefly met with therapist Mr. Elsie Prim, I have reviewed notes dated 09/07/2024.  Patient however has been noncompliant with psychotherapy and continues to use cocaine  up to twice a week.  Patient reports ongoing good days and bad days when  it comes to mood symptoms.  She reports she is currently taking Cymbalta  and trazodone  as prescribed.  Denies side effects.  She currently denies any suicidality, homicidality or perceptual disturbances.  She reports sleep is overall good and trazodone  is beneficial.  Visit Diagnosis:    ICD-10-CM   1. MDD (major depressive disorder), recurrent, in full remission  F33.42     2. Other specified anxiety disorders  F41.8    Limited symptom attacks    3. Insomnia due to medical condition  G47.01 traZODone  (DESYREL ) 100 MG tablet   OSA, RLS    4. Cocaine  use disorder, moderate, dependence (HCC)  F14.20     5. Tobacco use disorder  F17.200    moderate      Past Psychiatric History: I have reviewed past psychiatric history from progress note on 06/26/2020 as well as 08/13/2024.  Past Medical History:  Past Medical History:  Diagnosis Date   Anemia    Anginal pain    Anticoagulated on rivaroxaban    Anxiety    Aortic atherosclerosis    Arthritis    Asthma    Bilateral carotid artery stenosis    Carpal tunnel syndrome, right    Cervical radiculopathy    Chronic diarrhea    Cocaine  use disorder (HCC)    COPD (chronic obstructive pulmonary disease) (HCC)    Coronary artery disease    a. 75% pRCA; 3.5 x 28 mm Cypher DES placed on 07/24/2006; b. 04/2018 Cath: LM nl, LAD 30ost/p, LCX 30p/m, 36m/d, RCA 55p, 40p/m ISR, 70/11m, 100d, RPDA 70ost/p, RPDA/RPAV- fill via L->R collats->Med Rx; c. 04/2021 MV: No  ischemia/infarct.   Depression    secondary to the death of her husband (died 74)   Diverticulosis    Dizzinesses    Dysphagia    Fatty infiltration of liver    GERD (gastroesophageal reflux disease)    Headache    History of 2019 novel coronavirus disease (COVID-19) 12/09/2020   History of echocardiogram    a. 02/2023 Echo: EF 60-65%, no rwma, nl RV fxn, RVSP 35.66mmHg. Mild MR.   Hypertension    Hypertriglyceridemia    ILD (interstitial lung disease) (HCC)    Lump in the  abdomen    OSA on CPAP    Overactive bladder    Paroxysmal atrial fibrillation (HCC)    a. CHA2DS2VASc = 5-->rivaroxaban; b. 01/2022 Zio: 151 SVT runs. 3% Afib burden.   PSVT (paroxysmal supraventricular tachycardia)    a. 01/2022 Zio: 151 SVT runs - longest 21.4 secs.   Spastic colon    T2DM (type 2 diabetes mellitus) (HCC) 05/2008   Thrombocytopenia    Tobacco abuse    Venous insufficiency of both lower extremities     Past Surgical History:  Procedure Laterality Date   ABDOMINAL HYSTERECTOMY  with left ovary in place 1996   APPENDECTOMY  1985   BREAST BIOPSY Left 10/14/2017   calcs bx, fibrosis giant cell reaction and chronic inflammation, negative for malignancy.    CARPOMETACARPAL (CMC) FUSION OF THUMB Right 07/10/2022   Procedure: CARPOMETACARPAL (CMC) SUSPENSION OF RIGHT THUMB;  Surgeon: Edie Norleen PARAS, MD;  Location: [facility] ORS;  Service: Orthopedics;  Laterality: Right;   CATARACT EXTRACTION, BILATERAL     CESAREAN SECTION  1984   CHOLECYSTECTOMY  1985   COLONOSCOPY WITH PROPOFOL  N/A 09/13/2016   Procedure: COLONOSCOPY WITH PROPOFOL ;  Surgeon: Lamar ONEIDA Holmes, MD;  Location: [facility] ENDOSCOPY;  Service: Endoscopy;  Laterality: N/A;   COLONOSCOPY WITH PROPOFOL  N/A 11/09/2018   Procedure: COLONOSCOPY WITH PROPOFOL ;  Surgeon: Holmes Lamar ONEIDA, MD;  Location: [facility] ENDOSCOPY;  Service: Endoscopy;  Laterality: N/A;   COLONOSCOPY WITH PROPOFOL  N/A 03/29/2020   Procedure: COLONOSCOPY WITH PROPOFOL ;  Surgeon: Dessa Reyes ORN, MD;  Location: [facility] ENDOSCOPY;  Service: Endoscopy;  Laterality: N/A;   CORONARY ANGIOPLASTY WITH STENT PLACEMENT N/A 07/24/2006   75% pRCA; 3.5 x 28 mm Cypher DES placed; Location: [facility]; Surgeons: Margie Lovelace, MD   ESOPHAGOGASTRODUODENOSCOPY (EGD) WITH PROPOFOL  N/A 02/02/2018   Procedure: ESOPHAGOGASTRODUODENOSCOPY (EGD) WITH PROPOFOL ;  Surgeon: Holmes Lamar ONEIDA, MD;  Location: [facility] ENDOSCOPY;  Service: Endoscopy;  Laterality: N/A;    ESOPHAGOGASTRODUODENOSCOPY (EGD) WITH PROPOFOL  N/A 03/29/2020   Procedure: ESOPHAGOGASTRODUODENOSCOPY (EGD) WITH PROPOFOL ;  Surgeon: Dessa Reyes ORN, MD;  Location: [facility] ENDOSCOPY;  Service: Endoscopy;  Laterality: N/A;   EYE SURGERY     KNEE ARTHROPLASTY Bilateral    KNEE ARTHROSCOPY Left    LEFT HEART CATH AND CORONARY ANGIOGRAPHY Left 05/14/2018   Procedure: LEFT HEART CATH AND CORONARY ANGIOGRAPHY;  Surgeon: Hester Wolm PARAS, MD;  Location: [facility] INVASIVE CV LAB;  Service: Cardiovascular;  Laterality: Left;   SHOULDER SURGERY  shoulder operation secondary to a torn tendon   TOTAL HIP ARTHROPLASTY Left 06/12/2021   Procedure: TOTAL HIP ARTHROPLASTY;  Surgeon: Edie Norleen PARAS, MD;  Location: [facility] ORS;  Service: Orthopedics;  Laterality: Left;    Family Psychiatric History: I have reviewed family psychiatric history from progress note on 06/26/2020.  Family History:  Family History  Problem Relation Age of Onset   Other Mother        Hit by a  fire truck and has had multiple operations on her back , and has history of MVP    Mitral valve prolapse Mother    Lung cancer Mother    Depression Mother    Heart disease Father        myocardial infarction and is status post bypass surgery   Mitral valve prolapse Sister    Bipolar disorder Sister    Hepatitis C Brother    Cirrhosis Brother    Colon cancer Paternal Aunt    Cancer Maternal Grandmother    Heart disease Paternal Grandfather    Heart disease Paternal Grandmother    Cancer Maternal Aunt    Alcohol abuse Brother    Drug abuse Brother    Early death Brother    Breast cancer Neg Hx    Prostate cancer Neg Hx    Bladder Cancer Neg Hx    Kidney cancer Neg Hx     Social History: I have reviewed social history from progress note on 06/26/2020. Social History   Socioeconomic History   Marital status: Widowed    Spouse name: Levana Minetti   Number of children: 1   Years of education: 12   Highest education level: Associate degree:  occupational, scientist, product/process development, or vocational program  Occupational History    Employer: [employer]  Tobacco Use   Smoking status: Every Day    Current packs/day: 1.00    Average packs/day: 1 pack/day for 45.0 years (45.0 ttl pk-yrs)    Types: Cigarettes    Passive exposure: Current   Smokeless tobacco: Never   Tobacco comments:    Smokes 18 cigarettes every day   Vaping Use   Vaping status: Former   Substances: Flavoring  Substance and Sexual Activity   Alcohol use: No    Alcohol/week: 0.0 standard drinks of alcohol   Drug use: Yes    Types: Cocaine , Crack cocaine     Comment: varies , currently using   Sexual activity: Not Currently  Other Topics Concern   Not on file  Social History Narrative   Not on file   Social Drivers of Health   Tobacco Use: High Risk (12/21/2024)   Patient History    Smoking Tobacco Use: Every Day    Smokeless Tobacco Use: Never    Passive Exposure: Current  Financial Resource Strain: Low Risk  (10/28/2024)   Received from [facility]   Overall Financial Resource Strain (CARDIA)    Difficulty of Paying Living Expenses: Not very hard  Food Insecurity: Food Insecurity Present (10/28/2024)   Received from [facility]   Epic    Within the past 12 months, you worried that your food would run out before you got the money to buy more.: Never true    Within the past 12 months, the food you bought just didn't last and you didn't have money to get more.: Sometimes true  Transportation Needs: No Transportation Needs (10/28/2024)   Received from [facility] - Transportation    In the past 12 months, has lack of transportation kept you from medical appointments or from getting medications?: No    Lack of Transportation (Non-Medical): No  Physical Activity: Inactive (05/29/2024)   Exercise Vital Sign    Days of Exercise per Week: 0 days    Minutes of Exercise per Session: Not on file  Stress: Stress Concern  Present (05/29/2024)   [employer] of Occupational Health - Occupational Stress Questionnaire    Feeling  of Stress: Very much  Social Connections: Socially Isolated (05/29/2024)   Social Connection and Isolation Panel    Frequency of Communication with Friends and Family: Twice a week    Frequency of Social Gatherings with Friends and Family: Never    Attends Religious Services: Never    Database Administrator or Organizations: No    Attends Engineer, Structural: Not on file    Marital Status: Widowed  Depression (PHQ2-9): Low Risk (12/21/2024)   Depression (PHQ2-9)    PHQ-2 Score: 1  Alcohol Screen: Low Risk (05/29/2024)   Alcohol Screen    Last Alcohol Screening Score (AUDIT): 1  Housing: High Risk (10/28/2024)   Received from [facility]   Epic    In the last 12 months, was there a time when you were not able to pay the mortgage or rent on time?: Yes    Number of Times Moved in the Last Year: Not on file    At any time in the past 12 months, were you homeless or living in a shelter (including now)?: No  Utilities: Not At Risk (10/28/2024)   Received from [facility]   Epic    In the past 12 months has the electric, gas, oil, or water company threatened to shut off services in your home?: No  Health Literacy: Adequate Health Literacy (03/17/2024)   B1300 Health Literacy    Frequency of need for help with medical instructions: Never    Allergies: Allergies[1]  Metabolic Disorder Labs: Lab Results  Component Value Date   HGBA1C 5.6 07/27/2024   No results found for: PROLACTIN Lab Results  Component Value Date   CHOL 130 06/01/2024   TRIG 90.0 06/01/2024   HDL 49.90 06/01/2024   CHOLHDL 3 06/01/2024   VLDL 18.0 06/01/2024   LDLCALC 62 06/01/2024   LDLCALC 44 05/14/2023   Lab Results  Component Value Date   TSH 0.82 06/01/2024   TSH 1.23 06/17/2022    Therapeutic Level Labs: No results found for: LITHIUM No results  found for: VALPROATE No results found for: CBMZ  Current Medications: Current Outpatient Medications  Medication Sig Dispense Refill   acetaminophen  (TYLENOL ) 500 MG tablet Take 1,000 mg by mouth 2 (two) times daily. 2 tabs in the morning, 1 tab at 5pm, and 1 tab at bedtime     albuterol  (VENTOLIN  HFA) 108 (90 Base) MCG/ACT inhaler INHALE TWO PUFFS FOUR TIMES DAILY 18 g 1   amLODipine  (NORVASC ) 5 MG tablet TAKE 1 TABLET BY MOUTH ONCE DAILY 90 tablet 3   baclofen (LIORESAL) 10 MG tablet Take 10 mg by mouth 3 (three) times daily.     Budeson-Glycopyrrol-Formoterol  (BREZTRI  AEROSPHERE) 160-9-4.8 MCG/ACT AERO Inhale 2 puffs into the lungs in the morning and at bedtime. 32.1 g 3   Calcium  Carb-Cholecalciferol (CALCIUM  + VITAMIN D3 PO) Take 2 tablets by mouth daily.     Continuous Blood Gluc Receiver DEVI Use as directed to check blood sugars daily 1 each 0   Continuous Glucose Sensor (DEXCOM G7 SENSOR) MISC Apply 1 sensor every 10 days. 3 each 11   Cyanocobalamin (B-12) 1000 MCG TABS Take 1,000 mcg by mouth daily.     DULoxetine  (CYMBALTA ) 60 MG capsule TAKE 1 CAPSULE BY MOUTH ONCE DAILY 90 capsule 0   famotidine  (PEPCID ) 40 MG tablet Take 40 mg by mouth daily.     isosorbide  mononitrate (IMDUR ) 30 MG 24 hr tablet TAKE 1 TABLET BY MOUTH TWICE DAILY  90 tablet 0   L-LYSINE PO Take 1,000 mg by mouth daily.     loperamide  (IMODIUM ) 2 MG capsule Take 6 mg by mouth daily as needed for diarrhea or loose stools.     Multiple Vitamin (MULTIVITAMIN) tablet Take 1 tablet by mouth daily.     Multiple Vitamins-Minerals (HAIR SKIN NAILS) CAPS Take 5,000 mcg by mouth daily.     mupirocin  ointment (BACTROBAN ) 2 % Apply 1 Application topically 2 (two) times daily. (Patient taking differently: Apply 1 Application topically daily as needed.) 22 g 0   nitroGLYCERIN  (NITROSTAT ) 0.4 MG SL tablet PLACE 1 TABLET UNDER THE TONGUE EVERY 5 MINUTES AS NEEDED FOR CHEST PAIN 25 tablet 0   nystatin  (MYCOSTATIN /NYSTOP )  powder Apply topically 2 (two) times daily. to affected area(s) (Patient not taking: Reported on 12/07/2024) 30 g 0   pantoprazole  (PROTONIX ) 40 MG tablet TAKE 1 TABLET TWICE DAILY BEFORE MEALS (Patient taking differently: Take 40 mg by mouth 2 (two) times daily.) 180 tablet 3   propranolol  ER (INDERAL  LA) 80 MG 24 hr capsule Take 1 capsule (80 mg total) by mouth 2 (two) times daily. 60 capsule 6   rivaroxaban (XARELTO) 20 MG TABS tablet Take 20 mg by mouth daily with supper. Free through [employer] Patient assistance     rOPINIRole  (REQUIP ) 0.5 MG tablet TAKE 1 TABLET BY MOUTH DAILY AFTER LUNCHALONG WITH THE 3MG  TABLET FOR A TOTAL DAILY DOSE OF 3.5MG  (Patient taking differently: Take 0.5 mg by mouth at bedtime.) 90 tablet 3   rOPINIRole  (REQUIP ) 3 MG tablet TAKE ONE TABLET BY MOUTH AT BEDTIME. TAKE ALONG WITH 0.5MG  DAILY FOR A TOTAL OF 3.5MG  DAILY 90 tablet 3   rosuvastatin  (CRESTOR ) 20 MG tablet Take 1 tablet (20 mg total) by mouth daily. (Patient taking differently: Take 20 mg by mouth at bedtime.) 90 tablet 3   Semaglutide ,0.25 or 0.5MG /DOS, (OZEMPIC , 0.25 OR 0.5 MG/DOSE,) 2 MG/3ML SOPN Inject 0.5 mg into the skin once a week. (Patient taking differently: Inject 0.5 mg into the skin once a week. Sundays)     telmisartan  (MICARDIS ) 80 MG tablet TAKE 1 TABLET BY MOUTH DAILY. 90 tablet 3   traZODone  (DESYREL ) 100 MG tablet Take 1-2 tablets (100-200 mg total) by mouth at bedtime. 60 tablet 1   triamcinolone  cream (KENALOG ) 0.1 % Apply twice a day to the larger areas such as chest, lower back.  Avoid use over face and genital area (Patient not taking: Reported on 12/07/2024) 45 g 0   trimethoprim  (TRIMPEX ) 100 MG tablet Take 1 tablet (100 mg total) by mouth daily. 90 tablet 3   trospium  (SANCTURA ) 20 MG tablet Take 1 tablet (20 mg total) by mouth 2 (two) times daily. 60 tablet 11   No current facility-administered medications for this visit.     Musculoskeletal: Strength & Muscle Tone: UTA Gait & Station:  Seated Patient leans: N/A  Psychiatric Specialty Exam: Review of Systems  Psychiatric/Behavioral:         Anxious and sad at times although overall doing well - comorbid cocaine  abuse likely impacting mood    There were no vitals taken for this visit.There is no height or weight on file to calculate BMI.  General Appearance: Casual  Eye Contact:  Fair  Speech:  Clear and Coherent  Volume:  Normal  Mood:  anxious, sad at times likely related to comorbid cocaine  abuse  Affect:  Congruent  Thought Process:  Goal Directed and Descriptions of Associations: Intact  Orientation:  Full (Time, Place, and Person)  Thought Content: Logical   Suicidal Thoughts:  No  Homicidal Thoughts:  No  Memory:  Immediate;   Fair Recent;   Fair Remote;   Fair  Judgement:  Fair  Insight:  Fair  Psychomotor Activity:  Normal  Concentration:  Concentration: Fair and Attention Span: Fair  Recall:  [employer] of Knowledge: Fair  Language: Fair  Akathisia:  No  Handed:  Right  AIMS (if indicated): not done  Assets:  Communication Skills Desire for Improvement Housing Social Support  ADL's:  Intact  Cognition: WNL  Sleep:  Fair   Screenings: Midwife Visit from 05/14/2022 in [facility] Video Visit from 04/03/2022 in [facility]  AIMS Total Score 0 0   GAD-7    Flowsheet Row Office Visit from 06/01/2024 in [facility] [place] Most recent reading at 06/01/2024 10:19 AM Counselor from 03/18/2024 in [facility] [place] Most recent reading at 03/18/2024 10:20 AM Office Visit from 03/04/2024 in [facility] [place] Most recent reading at 03/04/2024  2:34 PM Office Visit from 03/04/2024 in [facility] Most recent reading at 03/04/2024 11:43 AM Counselor from 12/20/2022 in  [facility] Most recent reading at 12/20/2022 10:19 AM  Total GAD-7 Score 20 13 11 10 6    PHQ2-9    Flowsheet Row Video Visit from 12/21/2024 in [facility] Office Visit from 06/01/2024 in [facility] [employer] from 03/18/2024 in [facility] Clinical Support from 03/17/2024 in [facility] [employer] Visit from 03/04/2024 in [facility] [employer]  PHQ-2 Total Score 1 6 5 5 5   PHQ-9 Total Score -- 21 18 17 17    Flowsheet Row Video Visit from 12/21/2024 in [facility] Video Visit from 08/13/2024 in [facility] Counselor from 03/18/2024 in [facility] RISK CATEGORY No Risk No Risk No Risk     Assessment and Plan: Kathryn Friedman is a 69 year old Caucasian female who presented for a follow-up appointment, discussed assessment and plan as noted below.  1. MDD (major depressive disorder), recurrent, in full remission Currently does have ongoing episodes of sadness however overall reports depression symptoms as managed on the current medications.  Current mood symptoms likely also due to comorbid use of cocaine .  She will benefit from treatment for the same. Will refer patient for substance abuse treatment.  Provided [facility] including [facility] as well as [facility]. Continue Cymbalta  60 mg daily Continue Trazodone  100-200 mg at bedtime as needed Patient to establish care with new provider for further medication management and substance abuse treatment as needed.  2. Other specified anxiety disorders-improving Ongoing anxiety symptoms likely due to comorbid cocaine  abuse I have referred patient for substance abuse treatment program.  3.  Insomnia due to medical condition-improving Patient reports sleep is overall good. Continue sleep hygiene techniques Continue Trazodone  100-200 mg at bedtime as needed  4. Cocaine  use disorder, moderate, dependence (HCC)-unstable Currently reports ongoing use of cocaine  couple of times a week.  Patient has been noncompliant with recommendations for substance abuse treatment program.  Patient has been noncompliant with urine drug screen request as  well. Patient provided other resources and advised to establish care for further management.  5. Tobacco use disorder-unstable Patient not ready to quit.  Follow-up with substance abuse treatment programs.  Letter will be printed out with resources as well.    Collaboration of Care: Collaboration of Care: Other patient provided resources in the community for further management and treatment.  Patient/Guardian was advised Release of Information must be obtained prior to any record release in order to collaborate their care with an outside provider. Patient/Guardian was advised if they have not already done so to contact the registration department to sign all necessary forms in order for us  to release information regarding their care.   Consent: Patient/Guardian gives verbal consent for treatment and assignment of benefits for services provided during this visit. Patient/Guardian expressed understanding and agreed to proceed.   This note was generated in part or whole with voice recognition software. Voice recognition is usually quite accurate but there are transcription errors that can and very often do occur. I apologize for any typographical errors that were not detected and corrected.    Alek Poncedeleon, MD 12/22/2024, 9:02 AM     [1]  Allergies Allergen Reactions   Erythromycin Hives and Itching   Tobramycin Hives and Itching   Jardiance [Empagliflozin] Other (See Comments)    Yeast infection   Metformin  And Related Diarrhea    even  with XR

## 2024-12-23 ENCOUNTER — Encounter: Payer: Self-pay | Admitting: Psychiatry

## 2024-12-23 ENCOUNTER — Telehealth: Payer: Self-pay | Admitting: Psychiatry

## 2024-12-23 NOTE — Telephone Encounter (Signed)
 I have printed out a letter for this patient with resources.

## 2024-12-26 ENCOUNTER — Encounter: Payer: Self-pay | Admitting: Internal Medicine

## 2024-12-28 MED ORDER — FLUCONAZOLE 150 MG PO TABS: ORAL_TABLET | ORAL | 0 refills | Status: AC

## 2024-12-28 NOTE — Telephone Encounter (Signed)
Rx sent in for diflucan.   

## 2024-12-28 NOTE — Telephone Encounter (Signed)
 Please call her and clarify current symptoms she is having. When did the symptoms start? Is she having vaginal irritation? Just need to clarify what symptoms she is currently having.

## 2024-12-29 ENCOUNTER — Other Ambulatory Visit

## 2024-12-29 ENCOUNTER — Inpatient Hospital Stay: Admission: RE | Admit: 2024-12-29 | Source: Ambulatory Visit

## 2024-12-31 ENCOUNTER — Encounter: Payer: Self-pay | Admitting: Urology

## 2024-12-31 ENCOUNTER — Encounter
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
## 2025-01-03 ENCOUNTER — Encounter: Payer: Self-pay | Admitting: Urgent Care

## 2025-01-03 ENCOUNTER — Ambulatory Visit: Admission: RE | Admit: 2025-01-03 | Source: Home / Self Care | Admitting: Urology

## 2025-01-03 ENCOUNTER — Encounter: Admission: RE | Payer: Self-pay | Source: Home / Self Care

## 2025-01-03 HISTORY — DX: Insomnia, unspecified: G47.00

## 2025-01-03 HISTORY — DX: Long term (current) use of anticoagulants: Z79.01

## 2025-01-03 HISTORY — DX: Restless legs syndrome: G25.81

## 2025-01-03 SURGERY — INSERTION, SACRAL NERVE STIMULATOR, INTERSTIM, STAGE 1
Anesthesia: Monitor Anesthesia Care | Laterality: Bilateral

## 2025-01-18 ENCOUNTER — Ambulatory Visit: Admitting: Internal Medicine

## 2025-02-25 ENCOUNTER — Ambulatory Visit: Admitting: Nurse Practitioner

## 2025-03-25 ENCOUNTER — Ambulatory Visit
